# Patient Record
Sex: Male | Born: 1935
Health system: Southern US, Community
[De-identification: ages and names within clinical notes are randomized; demographics above are authoritative.]

## PROBLEM LIST (undated history)

## (undated) DIAGNOSIS — R55 Syncope and collapse: Secondary | ICD-10-CM

## (undated) DIAGNOSIS — C4491 Basal cell carcinoma of skin, unspecified: Secondary | ICD-10-CM

## (undated) DIAGNOSIS — I480 Paroxysmal atrial fibrillation: Secondary | ICD-10-CM

## (undated) DIAGNOSIS — I509 Heart failure, unspecified: Secondary | ICD-10-CM

## (undated) DIAGNOSIS — T4145XA Adverse effect of unspecified anesthetic, initial encounter: Secondary | ICD-10-CM

## (undated) DIAGNOSIS — I10 Essential (primary) hypertension: Secondary | ICD-10-CM

## (undated) DIAGNOSIS — N400 Enlarged prostate without lower urinary tract symptoms: Secondary | ICD-10-CM

## (undated) DIAGNOSIS — I2510 Atherosclerotic heart disease of native coronary artery without angina pectoris: Secondary | ICD-10-CM

## (undated) DIAGNOSIS — E785 Hyperlipidemia, unspecified: Secondary | ICD-10-CM

## (undated) DIAGNOSIS — I214 Non-ST elevation (NSTEMI) myocardial infarction: Secondary | ICD-10-CM

## (undated) DIAGNOSIS — K648 Other hemorrhoids: Secondary | ICD-10-CM

## (undated) DIAGNOSIS — I350 Nonrheumatic aortic (valve) stenosis: Secondary | ICD-10-CM

## (undated) DIAGNOSIS — K5732 Diverticulitis of large intestine without perforation or abscess without bleeding: Secondary | ICD-10-CM

## (undated) DIAGNOSIS — T8859XA Other complications of anesthesia, initial encounter: Secondary | ICD-10-CM

## (undated) HISTORY — DX: Diverticulitis of large intestine without perforation or abscess without bleeding: K57.32

## (undated) HISTORY — DX: Non-ST elevation (NSTEMI) myocardial infarction: I21.4

## (undated) HISTORY — DX: Essential (primary) hypertension: I10

## (undated) HISTORY — DX: Atherosclerotic heart disease of native coronary artery without angina pectoris: I25.10

## (undated) HISTORY — PX: SHOULDER SURGERY: SHX246

## (undated) HISTORY — DX: Benign prostatic hyperplasia without lower urinary tract symptoms: N40.0

## (undated) HISTORY — PX: CARDIAC CATHETERIZATION: SHX172

## (undated) HISTORY — PX: EYE SURGERY: SHX253

## (undated) HISTORY — DX: Other hemorrhoids: K64.8

## (undated) HISTORY — DX: Basal cell carcinoma of skin, unspecified: C44.91

## (undated) HISTORY — PX: COLONOSCOPY: SHX174

## (undated) HISTORY — PX: TRANSTHORACIC ECHOCARDIOGRAM: SHX275

## (undated) HISTORY — PX: TONSILLECTOMY: SUR1361

## (undated) HISTORY — DX: Hyperlipidemia, unspecified: E78.5

---

## 1989-06-28 DIAGNOSIS — Z951 Presence of aortocoronary bypass graft: Secondary | ICD-10-CM

## 1989-06-28 DIAGNOSIS — I2510 Atherosclerotic heart disease of native coronary artery without angina pectoris: Secondary | ICD-10-CM

## 1989-06-28 HISTORY — DX: Atherosclerotic heart disease of native coronary artery without angina pectoris: I25.10

## 1998-06-28 HISTORY — PX: CORONARY ARTERY BYPASS GRAFT: SHX141

## 1999-04-07 ENCOUNTER — Ambulatory Visit (HOSPITAL_COMMUNITY): Admission: RE | Admit: 1999-04-07 | Discharge: 1999-04-07 | Payer: Self-pay | Admitting: Cardiology

## 1999-04-13 ENCOUNTER — Inpatient Hospital Stay (HOSPITAL_COMMUNITY)
Admission: RE | Admit: 1999-04-13 | Discharge: 1999-04-18 | Payer: Self-pay | Admitting: Thoracic Surgery (Cardiothoracic Vascular Surgery)

## 1999-04-13 ENCOUNTER — Encounter: Payer: Self-pay | Admitting: Thoracic Surgery (Cardiothoracic Vascular Surgery)

## 1999-04-14 ENCOUNTER — Encounter: Payer: Self-pay | Admitting: Thoracic Surgery (Cardiothoracic Vascular Surgery)

## 1999-04-15 ENCOUNTER — Encounter: Payer: Self-pay | Admitting: Thoracic Surgery (Cardiothoracic Vascular Surgery)

## 1999-04-16 ENCOUNTER — Encounter: Payer: Self-pay | Admitting: Thoracic Surgery (Cardiothoracic Vascular Surgery)

## 1999-05-05 ENCOUNTER — Encounter (HOSPITAL_COMMUNITY): Admission: RE | Admit: 1999-05-05 | Discharge: 1999-08-03 | Payer: Self-pay | Admitting: Cardiology

## 2002-01-02 ENCOUNTER — Encounter: Payer: Self-pay | Admitting: Internal Medicine

## 2002-01-23 ENCOUNTER — Encounter: Payer: Self-pay | Admitting: Internal Medicine

## 2002-01-25 ENCOUNTER — Encounter: Payer: Self-pay | Admitting: Internal Medicine

## 2002-06-12 ENCOUNTER — Encounter: Payer: Self-pay | Admitting: Internal Medicine

## 2004-09-21 ENCOUNTER — Encounter: Admission: RE | Admit: 2004-09-21 | Discharge: 2004-09-21 | Payer: Self-pay | Admitting: Family Medicine

## 2004-09-21 ENCOUNTER — Encounter: Payer: Self-pay | Admitting: Internal Medicine

## 2005-01-05 ENCOUNTER — Encounter: Admission: RE | Admit: 2005-01-05 | Discharge: 2005-01-05 | Payer: Self-pay | Admitting: Orthopedic Surgery

## 2005-01-07 ENCOUNTER — Ambulatory Visit (HOSPITAL_BASED_OUTPATIENT_CLINIC_OR_DEPARTMENT_OTHER): Admission: RE | Admit: 2005-01-07 | Discharge: 2005-01-08 | Payer: Self-pay | Admitting: Orthopedic Surgery

## 2005-01-07 ENCOUNTER — Encounter: Payer: Self-pay | Admitting: Internal Medicine

## 2005-01-07 ENCOUNTER — Ambulatory Visit (HOSPITAL_COMMUNITY): Admission: RE | Admit: 2005-01-07 | Discharge: 2005-01-07 | Payer: Self-pay | Admitting: Orthopedic Surgery

## 2006-05-24 ENCOUNTER — Encounter: Payer: Self-pay | Admitting: Internal Medicine

## 2007-09-11 ENCOUNTER — Encounter: Payer: Self-pay | Admitting: Cardiology

## 2007-09-15 ENCOUNTER — Encounter: Payer: Self-pay | Admitting: Cardiology

## 2008-01-25 ENCOUNTER — Encounter: Payer: Self-pay | Admitting: Internal Medicine

## 2008-01-26 LAB — CONVERTED CEMR LAB
ALT: 17 units/L
AST: 25 units/L
Creatinine, Ser: 0.9 mg/dL
HDL: 73 mg/dL
Potassium: 4.9 meq/L
Total Bilirubin: 0.4 mg/dL
Triglycerides: 50 mg/dL

## 2008-03-11 ENCOUNTER — Ambulatory Visit: Payer: Self-pay | Admitting: Internal Medicine

## 2008-03-21 ENCOUNTER — Ambulatory Visit: Payer: Self-pay | Admitting: Internal Medicine

## 2008-03-25 ENCOUNTER — Encounter: Payer: Self-pay | Admitting: Cardiology

## 2008-07-04 ENCOUNTER — Ambulatory Visit: Payer: Self-pay | Admitting: Internal Medicine

## 2008-07-04 DIAGNOSIS — I10 Essential (primary) hypertension: Secondary | ICD-10-CM

## 2008-07-04 DIAGNOSIS — J310 Chronic rhinitis: Secondary | ICD-10-CM | POA: Insufficient documentation

## 2008-07-04 DIAGNOSIS — N400 Enlarged prostate without lower urinary tract symptoms: Secondary | ICD-10-CM

## 2008-07-04 DIAGNOSIS — E785 Hyperlipidemia, unspecified: Secondary | ICD-10-CM

## 2008-08-15 ENCOUNTER — Telehealth: Payer: Self-pay | Admitting: Internal Medicine

## 2008-09-10 ENCOUNTER — Encounter: Payer: Self-pay | Admitting: Cardiology

## 2009-02-06 ENCOUNTER — Ambulatory Visit: Payer: Self-pay | Admitting: Internal Medicine

## 2009-02-06 LAB — CONVERTED CEMR LAB
Albumin: 3.8 g/dL (ref 3.5–5.2)
Alkaline Phosphatase: 69 units/L (ref 39–117)
BUN: 17 mg/dL (ref 6–23)
CO2: 26 meq/L (ref 19–32)
Chloride: 99 meq/L (ref 96–112)
Creatinine, Ser: 1.01 mg/dL (ref 0.40–1.50)
Eosinophils Absolute: 0.5 10*3/uL (ref 0.0–0.7)
Eosinophils Relative: 6 % — ABNORMAL HIGH (ref 0–5)
HCT: 38.6 % — ABNORMAL LOW (ref 39.0–52.0)
HDL: 87 mg/dL (ref >39)
LDL Cholesterol: 70 mg/dL (ref 0–99)
Lymphs Abs: 1.8 10*3/uL (ref 0.7–4.0)
MCV: 84.3 fL (ref 78.0–100.0)
Monocytes Relative: 12 % (ref 3–12)
PSA: 10.24 ng/mL — ABNORMAL HIGH (ref 0.10–4.00)
RBC: 4.58 M/uL (ref 4.22–5.81)
Total Bilirubin: 0.6 mg/dL (ref 0.3–1.2)
VLDL: 12 mg/dL (ref 0–40)
WBC: 8.2 10*3/uL (ref 4.0–10.5)

## 2009-03-27 ENCOUNTER — Encounter: Payer: Self-pay | Admitting: Cardiology

## 2009-03-28 ENCOUNTER — Encounter: Payer: Self-pay | Admitting: Internal Medicine

## 2009-03-31 ENCOUNTER — Encounter: Payer: Self-pay | Admitting: Internal Medicine

## 2010-02-16 ENCOUNTER — Ambulatory Visit: Payer: Self-pay | Admitting: Internal Medicine

## 2010-02-17 ENCOUNTER — Telehealth: Payer: Self-pay | Admitting: Internal Medicine

## 2010-02-17 ENCOUNTER — Encounter: Payer: Self-pay | Admitting: Internal Medicine

## 2010-02-18 ENCOUNTER — Ambulatory Visit: Payer: Self-pay | Admitting: Internal Medicine

## 2010-02-23 ENCOUNTER — Ambulatory Visit: Payer: Self-pay | Admitting: Cardiology

## 2010-02-23 DIAGNOSIS — I2510 Atherosclerotic heart disease of native coronary artery without angina pectoris: Secondary | ICD-10-CM

## 2010-02-23 DIAGNOSIS — Z9861 Coronary angioplasty status: Secondary | ICD-10-CM

## 2010-03-04 ENCOUNTER — Encounter: Payer: Self-pay | Admitting: Internal Medicine

## 2010-03-10 ENCOUNTER — Ambulatory Visit (HOSPITAL_COMMUNITY): Admission: RE | Admit: 2010-03-10 | Discharge: 2010-03-10 | Payer: Self-pay | Admitting: Cardiology

## 2010-03-10 ENCOUNTER — Ambulatory Visit: Payer: Self-pay

## 2010-03-10 ENCOUNTER — Ambulatory Visit: Payer: Self-pay | Admitting: Cardiology

## 2010-03-10 ENCOUNTER — Encounter: Payer: Self-pay | Admitting: Cardiology

## 2010-07-26 LAB — CONVERTED CEMR LAB
BUN: 13 mg/dL (ref 6–23)
Bilirubin Urine: NEGATIVE
Bilirubin, Direct: 0.2 mg/dL (ref 0.0–0.3)
CO2: 28 meq/L (ref 19–32)
Chloride: 101 meq/L (ref 96–112)
Eosinophils Absolute: 0.2 10*3/uL (ref 0.0–0.7)
Glucose, Bld: 94 mg/dL (ref 70–99)
Indirect Bilirubin: 0.6 mg/dL (ref 0.0–0.9)
LDL Cholesterol: 84 mg/dL (ref 0–99)
Leukocytes, UA: NEGATIVE
Lymphocytes Relative: 22 % (ref 12–46)
Lymphs Abs: 1.6 10*3/uL (ref 0.7–4.0)
MCV: 88.3 fL (ref 78.0–100.0)
Monocytes Relative: 12 % (ref 3–12)
Neutro Abs: 4.6 10*3/uL (ref 1.7–7.7)
Neutrophils Relative %: 62 % (ref 43–77)
Potassium: 5.2 meq/L (ref 3.5–5.3)
Protein, ur: NEGATIVE mg/dL
RBC: 4.87 M/uL (ref 4.22–5.81)
Sodium: 137 meq/L (ref 135–145)
Specific Gravity, Urine: 1.015 (ref 1.005–1.030)
Total Bilirubin: 0.8 mg/dL (ref 0.3–1.2)
Total CHOL/HDL Ratio: 2.2
Urine Glucose: NEGATIVE mg/dL
Urobilinogen, UA: 0.2 (ref 0.0–1.0)
VLDL: 14 mg/dL (ref 0–40)
WBC: 7.4 10*3/uL (ref 4.0–10.5)

## 2010-07-28 NOTE — Assessment & Plan Note (Signed)
Summary: cpx/mhf   Vital Signs:  Patient profile:   77 year old male Height:      65 inches Weight:      152 pounds BMI:     25.39 O2 Sat:      98 % on Room air Temp:     98.1 degrees F oral Pulse rate:   58 / minute Pulse rhythm:   regular Resp:     16 per minute BP sitting:   110 / 70  (right arm) Cuff size:   regular  Vitals Entered By: Glendell Docker CMA (February 16, 2010 8:29 AM)  O2 Flow:  Room air  Primary Care Provider:  D. Thomos Lemons DO   History of Present Illness: 77 y/o white male for routine cpx  CAD - Dr. Riley Kill new cardiologist - 8/29 appt  elevated PSA - hasn't seen Dr. Vonita Moss  colonoscopy - 1.5 years ago  pt and wife are transitioning to assisted living facility he is independent  wife does not want to be in a difficult position in case something happens to pt  no hx of falls no memory problems   Preventive Screening-Counseling & Management  Alcohol-Tobacco     Alcohol drinks/day: 2     Smoking Status: 2quit  Caffeine-Diet-Exercise     Caffeine use/day: 2-3 cups coffee daily     Does Patient Exercise: yes     Times/week: 3  Allergies (verified): No Known Drug Allergies  Past History:  Past Medical History: CAD - 1990 angioplasty  Dr Aleen Campi S/P CABG x 4 Last stress test 1 yr ago - EF reported normal. Hypertension    Hyperlipidemia BPH - Dr. Vonita Moss Sigmoid diverticulosis and internal hemorrhoids - Dr. Leone Payor Basal cell carcinoma - left chest 05/2002  Social History: Former smoker - smoked for 15 yrs 1/2 ppd.  quit in 1973 Retired Licensed conveyancer business (wafer)  [employer] Alcohol use-yes (1-2 glasses of wine ) 2 children sons (52 -48) 5 grandchildren Regular exercise-yes 1 son lives in [place] area 1 son lives in [place] areaSmoking Status:  2quit Caffeine use/day:  2-3 cups coffee daily  Review of Systems       The patient complains of anorexia.  The patient denies weight loss, weight gain, chest pain, dyspnea on  exertion, melena, and hematochezia.    Physical Exam  General:  alert, well-developed, and well-nourished.   Head:  normocephalic and atraumatic.   Ears:  bil hearing aids Mouth:  good dentition and pharynx pink and moist.   Neck:  No deformities, masses, or tenderness noted. Lungs:  normal respiratory effort and normal breath sounds.   Heart:  normal rate and regular rhythm.  SEM 2/6 LSB Abdomen:  soft, non-tender, normal bowel sounds, no masses, no hepatomegaly, and no splenomegaly.   Extremities:  No lower extremity edema  Neurologic:  cranial nerves II-XII intact and gait normal.   Psych:  normally interactive, good eye contact, not anxious appearing, and not depressed appearing.     Impression & Recommendations:  Problem # 1:  PREVENTIVE HEALTH CARE (ICD-V70.0) Reviewed adult health maintenance protocols. Agree with decision for assisted living  Colonoscopy: Location:  [facility].   (03/21/2008) Td Booster: Historical (05/14/2009)   Flu Vax: given (03/28/2009)   Pneumovax: given (01/16/2008) Chol: 169 (02/06/2009)   HDL: 87 (02/06/2009)   LDL: 70 (02/06/2009)   TG: 59 (02/06/2009) TSH: 3.146 (02/06/2009)   PSA: 10.24 (02/06/2009)  Complete Medication List: 1)  Atenolol 25 Mg Tabs (Atenolol) .Marland KitchenMarland KitchenMarland Kitchen  Take 1 tablet by mouth once a day 2)  Lisinopril-hydrochlorothiazide 20-12.5 Mg Tabs (Lisinopril-hydrochlorothiazide) .... Take 1 tablet by mouth once a day 3)  Lovastatin 20 Mg Tabs (Lovastatin) .... Take 1 tablet by mouth once a day 4)  Finasteride 5 Mg Tabs (Finasteride) .... Take 1 tablet by mouth once a day 5)  Omega-3 Fish Oil 1200 Mg Caps (Omega-3 fatty acids) .... Take 1 tablet by mouth once a day 6)  Multivitamins Tabs (Multiple vitamin) .... Take 1 tablet by mouth once a day 7)  Tylenol Pm Extra Strength 500-25 Mg Tabs (Diphenhydramine-apap (sleep)) .... Take 1 tablet by mouth once a day as needed sleep  Other Orders: EKG w/ Interpretation (93000) T-Basic  Metabolic Panel [id number]) T-Hepatic Function [id number]) T-CBC w/Diff [id number]) T-Urinalysis (41324-40102) T-Lipid Profile (72536-64403) T-PSA (47425-95638) TB Skin Test (75643) Admin 1st Vaccine (32951)  Patient Instructions: 1)  Please schedule a follow-up appointment in 1 year. Prescriptions: FINASTERIDE 5 MG TABS (FINASTERIDE) Take 1 tablet by mouth once a day  #90 x 3   Entered and Authorized by:   D. Thomos Lemons DO   Signed by:   D. Thomos Lemons DO on 02/16/2010   Method used:   Electronically to        [facility]* (retail)             ,          Ph: [phone]       Fax: [phone]   RxID:   [id number] LOVASTATIN 20 MG TABS (LOVASTATIN) Take 1 tablet by mouth once a day  #90 x 3   Entered and Authorized by:   D. Thomos Lemons DO   Signed by:   D. Thomos Lemons DO on 02/16/2010   Method used:   Electronically to        [facility]* (retail)             ,          Ph: [phone]       Fax: [phone]   RxID:   [id number] LISINOPRIL-HYDROCHLOROTHIAZIDE 20-12.5 MG TABS (LISINOPRIL-HYDROCHLOROTHIAZIDE) Take 1 tablet by mouth once a day  #90 x 3   Entered and Authorized by:   D. Thomos Lemons DO   Signed by:   D. Thomos Lemons DO on 02/16/2010   Method used:   Electronically to        [facility]* (retail)             ,          Ph: [phone]       Fax: [phone]   RxID:   [id number] ATENOLOL 25 MG TABS (ATENOLOL) Take 1 tablet by mouth once a day  #90 x 3   Entered and Authorized by:   D. Thomos Lemons DO   Signed by:   D. Thomos Lemons DO on 02/16/2010   Method used:   Electronically to        [facility]* (retail)             ,          Ph: [phone]       Fax: [phone]   RxID:   [id number]   Current Allergies (reviewed today): No known allergies    Immunization History:  Tetanus/Td Immunization History:    Tetanus/Td:  historical (05/14/2009)  Zostavax History:    Zostavax # 1:  zostavax  (02/11/2004)  Immunizations Administered:  PPD Skin Test:  Vaccine Type: PPD    Site: left forearm    Mfr: Sanofi Pasteur    Dose: 0.1 ml    Route: ID    Given by: Glendell Docker CMA    Exp. Date: 04/30/2011    Lot #: [id number]

## 2010-07-28 NOTE — Progress Notes (Signed)
Summary: [facility] Office Visit Note   [facility] Office Visit Note   Imported By: Roderic Ovens 03/12/2010 15:14:38  _____________________________________________________________________  External Attachment:    Type:   Image     Comment:   External Document

## 2010-07-28 NOTE — Progress Notes (Signed)
Summary: [facility] Office Visit Note   [facility] Office Visit Note   Imported By: Roderic Ovens 03/12/2010 15:14:15  _____________________________________________________________________  External Attachment:    Type:   Image     Comment:   External Document

## 2010-07-28 NOTE — Letter (Signed)
Summary: [facility]   Imported By: Sherian Rein 03/14/2010 11:41:51  _____________________________________________________________________  External Attachment:    Type:   Image     Comment:   External Document

## 2010-07-28 NOTE — Assessment & Plan Note (Signed)
Summary: tb skin test read/dt  Nurse Visit   Primary Care Provider:  Dondra Spry DO  CC:  TB Skin Test Read.  History of Present Illness: The patient presented after 48 hours to check the injection site for positive or negative reaction.  Injection site examination:  No firm bump forms at the test site. Slightly reddish appearance  and diameter was smaller than 5mm.  Assement & Plan:  Negative Tb skin test. Patient was counseled to call if experience any irritation of site.  Glendell Docker CMA  February 18, 2010 8:50 AM    Allergies: No Known Drug Allergies

## 2010-07-28 NOTE — Assessment & Plan Note (Signed)
Summary: np6/dx: CAD   Visit Type:  Initial Consult Primary Provider:  Dondra Spry DO  CC:  CAD.  History of Present Illness: Patient has been followed by Dr. Aleen Campi since about 1991.  He had prior PCI and CABG then in 2000.  He is able to do everything he wants.  Patient walks three times per week.  Now seeing Dr. Artist Pais for primary care.  I care for his wife, so he wanted to transfer his care over to our office.  He remains active, does most of what he wants, denies any major issues at this time. Walks 30-40 minutes 3-4 times per week, does yard work, Catering manager.  No current complaints.   April 13, 1999  PROCEDURE:  On April 13, 1999.  DISCHARGE DIAGNOSES: 1. Coronary artery disease, status post coronary artery bypass grafting x 5. 2. Hyponatremia, resolved. 3. Elevated white blood cells, resolved. 4. Paroxysmal atrial fibrillation, asymptomatic, resolved on digoxin    protocol and Cardizem. 5. Benign prostatic hypertrophy. 6. Ejection fraction of 50%-55%. 7. History of premature ventricular contractions. 8. History of tobacco abuse.  PROCEDURE:  Status post coronary artery bypass grafting x 5, secondary to three-vessel disease with LIMA to the LAD, sequential SVG to the OM-I, and OM-III, and SVG to the diagonal-I, and SVG to the PDA by Dr. Viviann Spare C. Dorris Fetch on  April 13, 1999.  COMPLICATIONS:  Paroxysmal atrial fibrillation on April 16, 1999, resolving on April 17, 1999.  Review of records:  [facility] GXT 03/27/2009:  Neg GXT to HR 142.  occasional uinfocal PVCs and couplets, otherwise unremarkable.  Done by Coral Ceo, MD  Current Medications (verified): 1)  Atenolol 25 Mg Tabs (Atenolol) .... Take 1 Tablet By Mouth Once A Day 2)  Lisinopril-Hydrochlorothiazide 20-12.5 Mg Tabs (Lisinopril-Hydrochlorothiazide) .... Take 1 Tablet By Mouth Once A Day 3)  Lovastatin 20 Mg Tabs (Lovastatin) .... Take 1 Tablet By Mouth Once A Day 4)  Finasteride 5 Mg Tabs (Finasteride) ....  Take 1 Tablet By Mouth Once A Day 5)  Omega-3 Fish Oil 1200 Mg Caps (Omega-3 Fatty Acids) .... Take 1 Tablet By Mouth Once A Day 6)  Multivitamins  Tabs (Multiple Vitamin) .... Take 1 Tablet By Mouth Once A Day 7)  Tylenol Pm Extra Strength 500-25 Mg Tabs (Diphenhydramine-Apap (Sleep)) .... Take 1 Tablet By Mouth Once A Day As Needed Sleep  Allergies (verified): No Known Drug Allergies  Past History:  Past Medical History: Last updated: 02/19/2010 CAD - 1990 angioplasty  Dr Aleen Campi S/P CABG x 4 Last stress test 1 yr ago - EF reported normal. Hypertension   Hyperlipidemia BPH - Dr. Vonita Moss Sigmoid diverticulosis and internal hemorrhoids - Dr. Leone Payor Basal cell carcinoma - left chest 05/2002  Past Surgical History: Last updated: 02/19/2010 CABG x 4 2000 - Dr. Orson Aloe Colonoscopy    Shoulder surgery x 2 - Dr. Teressa Senter  Family History: Last updated: 02/19/2010 CAD - father Hyperlipidemia-father  alcoholism and tob abuse - age 24 Bipolar disorder-mother  Social History: Last updated: 02/19/2010 Former smoker - smoked for 15 yrs 1/2 ppd.  quit in 1973 Retired Licensed conveyancer business (wafer)  [employer] Alcohol use-yes (1-2 glasses of wine ) 2 children sons (52 -51) 5 grandchildren Regular exercise-yes  Risk Factors: Caffeine Use: 2 (07/04/2008) Exercise: yes (02/06/2009)  Review of Systems  The patient denies chest pain, syncope, dyspnea on exertion, peripheral edema, prolonged cough, muscle weakness, and difficulty walking.         Does complain of ED.  and urinary issues.    Vital Signs:  Patient profile:   77 year old male Height:      65 inches Weight:      155.25 pounds BMI:     25.93 Pulse rate:   60 / minute Pulse rhythm:   regular Resp:     18 per minute BP sitting:   100 / 68  (left arm) Cuff size:   large  Vitals Entered By: Vikki Ports (February 23, 2010 9:34 AM)  Physical Exam  General:  Well developed, well nourished, in no acute  distress. Head:  normocephalic and atraumatic Eyes:  PERRLA/EOM intact; conjunctiva and lids normal. Neck:  No JVD.  otherwise unremarkable status at present.  Lungs:  Clear bilaterally to auscultation and percussion. Heart:  PMI non displaced.  Normal S1 and S2.  No murmur or rub. Abdomen:  Bowel sounds positive; abdomen soft and non-tender without masses, organomegaly, or hernias noted. No hepatosplenomegaly. Extremities:  No clubbing or cyanosis. Neurologic:  Alert and oriented x 3.   EKG  Procedure date:  02/16/2010  Findings:      SB.  Otherwise normal.  Impression & Recommendations:  Problem # 1:  CAD, ARTERY BYPASS GRAFT (ICD-414.04) See report from above.  Doing well overall.  Regular exercise,  good medical care.  Now ten years out from CABG.  Suggest continue current habits.  Will get echo to assess LV function. His updated medication list for this problem includes:    Atenolol 25 Mg Tabs (Atenolol) .[name]... Take 1 tablet by mouth once a day    Lisinopril-hydrochlorothiazide 20-12.5 Mg Tabs (Lisinopril-hydrochlorothiazide) .[name]... Take 1 tablet by mouth once a day  Problem # 2:  HYPERLIPIDEMIA (ICD-272.4) under the current care of Dr. Artist Pais.  Statin options reveiwed.   His updated medication list for this problem includes:    Lovastatin 20 Mg Tabs (Lovastatin) .[name]... Take 1 tablet by mouth once a day  Problem # 3:  HYPERTENSION (ICD-401.9) controlled at present.   Reviewed data on atenolol. His updated medication list for this problem includes:    Atenolol 25 Mg Tabs (Atenolol) .[name]... Take 1 tablet by mouth once a day    Lisinopril-hydrochlorothiazide 20-12.5 Mg Tabs (Lisinopril-hydrochlorothiazide) .[name]... Take 1 tablet by mouth once a day  Orders: Echocardiogram (Echo)  Patient Instructions: 1)  Your physician wants you to follow-up in: 1 YEAR.  You will receive a reminder letter in the mail two months in advance. If you don't receive a letter, please call our office to  schedule the follow-up appointment. 2)  Your physician has requested that you have an echocardiogram.  Echocardiography is a painless test that uses sound waves to create images of your heart. It provides your doctor with information about the size and shape of your heart and how well your heart's chambers and valves are working.  This procedure takes approximately one hour. There are no restrictions for this procedure.

## 2010-07-28 NOTE — Progress Notes (Signed)
Summary: Lab Results  Phone Note Outgoing Call   Summary of Call: call pt - PSA elevated at 14.07.  copy was faxed to Dr. Enos Fling office.  please make sure pt has f/u appt with urologist Initial call taken by: D. Thomos Lemons DO,  February 17, 2010 12:33 PM  Follow-up for Phone Call        Left message on machine to return my call. Nicki Guadalajara Fergerson CMA Duncan Dull)  February 17, 2010 3:09 PM   Left message on machine to return my call.  Nicki Guadalajara Fergerson CMA Duncan Dull)  February 18, 2010 9:09 AM   Additional Follow-up for Phone Call Additional follow up Details #1::        patient presented to for ppd he has been advised per Dr Artist Pais instructions. He states that he will call today to obtain appointment with Dr Vonita Moss Additional Follow-up by: Glendell Docker CMA,  February 18, 2010 9:32 AM

## 2010-07-28 NOTE — Letter (Signed)
   [facility] [address] [place], [place]  [address]  [place] Phone: [phone]      February 17, 2010   [facility] [address] [place], [place] [address]  RE:  LAB RESULTS  Dear  Mr. TREESE,  The following is an interpretation of your most recent lab tests.  Please take note of any instructions provided or changes to medications that have resulted from your lab work.  PSA:  high - further testing needed PSA: 14.07  ELECTROLYTES:  Good - no changes needed  KIDNEY FUNCTION TESTS:  Good - no changes needed  LIVER FUNCTION TESTS:  Good - no changes needed  LIPID PANEL:  Good - no changes needed Triglyceride: 70   Cholesterol: 182   LDL: 84   HDL: 84   Chol/HDL%:  2.2 Ratio  THYROID STUDIES:  Thyroid studies normal TSH: 3.146     CBC:  Good - no changes needed  A copy of recent blood test was faxed to Dr. Vonita Moss re:  elevated PSA.       Sincerely Yours,    Dr. Thomos Lemons

## 2010-07-28 NOTE — Letter (Signed)
Summary: [facility] [place], Labs, Operative Report,Cath Report/  [facility] [place], Labs, Operative Report,Cath Report/Discharge Summary 1990 - 2008   Imported By: Roderic Ovens 03/12/2010 15:22:56  _____________________________________________________________________  External Attachment:    Type:   Image     Comment:   External Document

## 2010-07-28 NOTE — Miscellaneous (Signed)
Summary: Applicant Medical Certificate Form/[facility]/[place]   Imported By: Lanelle Bal 02/27/2010 08:58:48  _____________________________________________________________________  External Attachment:    Type:   Image     Comment:   External Document

## 2010-09-09 ENCOUNTER — Telehealth: Payer: Self-pay | Admitting: Family

## 2010-09-09 ENCOUNTER — Emergency Department (HOSPITAL_BASED_OUTPATIENT_CLINIC_OR_DEPARTMENT_OTHER)
Admission: EM | Admit: 2010-09-09 | Discharge: 2010-09-09 | Disposition: A | Discharge status: Home / Self Care | Payer: Medicare Other | Attending: Emergency Medicine | Admitting: Emergency Medicine

## 2010-09-09 DIAGNOSIS — I10 Essential (primary) hypertension: Secondary | ICD-10-CM | POA: Insufficient documentation

## 2010-09-09 DIAGNOSIS — I2510 Atherosclerotic heart disease of native coronary artery without angina pectoris: Secondary | ICD-10-CM | POA: Insufficient documentation

## 2010-09-09 DIAGNOSIS — IMO0002 Reserved for concepts with insufficient information to code with codable children: Secondary | ICD-10-CM | POA: Insufficient documentation

## 2010-09-09 DIAGNOSIS — Y92009 Unspecified place in unspecified non-institutional (private) residence as the place of occurrence of the external cause: Secondary | ICD-10-CM | POA: Insufficient documentation

## 2010-09-09 DIAGNOSIS — Z951 Presence of aortocoronary bypass graft: Secondary | ICD-10-CM | POA: Insufficient documentation

## 2010-09-09 DIAGNOSIS — Z79899 Other long term (current) drug therapy: Secondary | ICD-10-CM | POA: Insufficient documentation

## 2010-09-09 DIAGNOSIS — W07XXXA Fall from chair, initial encounter: Secondary | ICD-10-CM | POA: Insufficient documentation

## 2010-09-09 DIAGNOSIS — I252 Old myocardial infarction: Secondary | ICD-10-CM | POA: Insufficient documentation

## 2010-09-15 NOTE — Progress Notes (Addendum)
  Phone Note Other Incoming   Summary of Call: Pt presented to front desk following fall this afternoon in his office.  Noted to have swelling of his right forehead and associate abrasion.  Also reports + laceration to right cheek- which is obscured by cloth which has adhered to cheek.  Recommended to patient and wife that they proceed immediately to the ED downstairs.  They verbalized understanding. Initial call taken by: Lemont Fillers FNP,  September 09, 2010 4:37 PM

## 2010-10-28 ENCOUNTER — Inpatient Hospital Stay (HOSPITAL_COMMUNITY): Admit: 2010-10-28 | Payer: Self-pay | Admitting: Orthopedic Surgery

## 2010-11-13 NOTE — Discharge Summary (Signed)
[place]  Patient:    Justin [name]                        MRN: [id number] Adm. Date:  [id number] Disc. Date: 04/18/99 Attending:  Silvestre [name] Dictator:   Justin [name], P.A. CC:         Justin [name]. Justin [name], M.D.                           Discharge Summary  DATE OF BIRTH:  09/17/1928  PROCEDURE:  On April 13, 1999.  DISCHARGE DIAGNOSES: 1. Coronary artery disease, status post coronary artery bypass grafting x 5. 2. Hyponatremia, resolved. 3. Elevated white blood cells, resolved. 4. Paroxysmal atrial fibrillation, asymptomatic, resolved on digoxin    protocol and Cardizem. 5. Benign prostatic hypertrophy. 6. Ejection fraction of 50%-55%. 7. History of premature ventricular contractions. 8. History of tobacco abuse.  PROCEDURE:  Status post coronary artery bypass grafting x 5, secondary to three-vessel disease with LIMA to the LAD, sequential SVG to the OM-I, and OM-III, and SVG to the diagonal-I, and SVG to the PDA by Dr. Viviann Spare C. Dorris [name] on  April 13, 1999.  COMPLICATIONS:  Paroxysmal atrial fibrillation on April 16, 1999, resolving on April 17, 1999.  DISCHARGE MEDICATIONS: 1. Digoxin 0.125 mg one p.o. q.d. 2. K-Dur 20 mEq one p.o. q.d. 3. Cardizem CD 120 mg p.o. q.d. 4. Aspirin 325 mg p.o. q.d.  HISTORY OF PRESENT ILLNESS:  Justin [name] is a 77 year old white male, previously in good health, with a history of coronary artery disease dating back to an angioplasty 10 years ago.  The patient recently presented with recurrent exertional angina.  He had a positive stress test, and underwent a cardiac catheterization. The catheterization revealed severe three-vessel coronary artery disease.  The patient then was referred for a coronary artery bypass grafting.  HOSPITAL COURSE:  On April 13, 1999, the patient underwent a CABG, without complications by Dr. Dorris [name].  On April 14, 1999, postoperative  day number one, the patient was doing quite well.  Cardiovascularly he was stable; however, he developed some hematuria during the CABG.  On April 15, 1999, the hematuria resolved.  On April 16, 1999, the patient continued to ambulate.  His vital signs were stable.  He had no cardiac complaints; however, later that afternoon after  removing the pacing wires, he developed atrial fibrillation, which was asymptomatic.   Digitalis protocol was given, as well as Cardizem IV drip.  He converted to normal sinus rhythm later that evening, with a heart rate in the 70s. The Cardizem drip remained until weaned off.  On the other hand, his blood pressure was decreased to 96/62, and continued to be decreased on the morning of April 17, 1999, with a value of 95/55, with a pulse of 79.  His saturations were within normal limits at 93 on room air.  Therefore his atenolol was decreased to 12.5 instead of 25 q.d.  On April 18, 1999, his vital signs were stable.  He was afebrile.  He continued to be normal sinus rhythm, with no new episodes of arrhythmias.  His physical examination was unremarkable.  His incision was clean and dry.  He then was discharged in stable condition.  DISCHARGE INSTRUCTIONS:  Were provided and understood by the patient. DD:  06/24/99 TD:  06/24/99 Job: [id number]

## 2010-11-13 NOTE — Op Note (Signed)
Justin [name], Justin [name]                ACCOUNT NO.:  [id number]   MEDICAL RECORD NO.:  [id number]          PATIENT TYPE:  AMB   LOCATION:  DSC                          FACILITY:  [facility]   PHYSICIAN:  Katy Fitch. Sypher, M.D. 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   POSTOPERATIVE DIAGNOSIS:  Subacute-on-chronic retracted rotator cuff tear  involving supraspinatus, upper subscapularis, infraspinatus and a portion of  the teres minor with significant acromioclavicular degenerative arthritis  and rupture of long head of biceps.   OPERATION:  1.  Diagnostic arthroscopy of right glenohumeral joint followed by      arthroscopic debridement of biceps tendon stump, labral fragments and      synovitis with arthroscopic tenolysis of teres minor and infraspinatus      rotator cuff tendons  2.  Open reconstruction of right rotator cuff with transfer of superior 50%      of subscapularis tendon to infraspinatus for coverage of humeral head      followed by convergence of a retracted rotator cuff tear and advancement      laterally to a decorticated greater tuberosity.  3.  Resection of distal clavicle.   OPERATING SURGEON:  Katy Fitch. Sypher, M.D.   ASSISTANT:  Annye Rusk, PA-C.   ANESTHESIA:  general endotracheal supplemented by interscalene block.   SUPERVISING ANESTHESIOLOGIST:  Lenus Trauger. Gelene Mink, M.D.   INDICATIONS:  Justin [name] is a 77 year old man who was referred by Dr. Shela Commons.  D. Kindl for evaluation and management of painful right shoulder.   Justin [name] reported a history of developing shoulder pain in 2002.  He was  seen by Dr. Jearld Adjutant and was noted to have a single tendon rotator  cuff tear.  He was treated with  rehabilitation.   He subsequently did well until falling early in 2006.  Thereafter, he  developed obvious signs of a rupture of the long head of the biceps, marked  weakness of abduction and shoulder pain.  He was referred for an upper  extremity orthopedic consult.  Clinical examination revealed signs of a  massive rotator cuff tear.  Plain films demonstrated AC arthropathy and MRI  documented a chronic retracted tear of the supraspinatus and infraspinatus  with tendinopathy of the subscapularis and AC arthropathy.  The acromial  morphology was not particularly unfavorable.   Due to Justin [name]'s desire to play golf and remain active, we recommended that  we try to at least partially repair this subacute-on-chronic tear.   While it had been more than 3 months since his acute episode, he appeared to  have healthy-appearing subscapularis and teres minor tendons and a partially  healthy-appearing infraspinatus which should provide enough motor function  to cover his humeral head and stabilize the glenohumeral joint.   After informed consent, he is brought to the operating at this time.  During our informed consent prior to surgery, we mentioned that should this  be unsuccessful, he would be a candidate for a cuff tear arthropathy  hemiarthroplasty to relieve pain as a salvage strategy if our rotator cuff  reconstruction is unsuccessful in relieving his pain.   Preoperatively, questions were invited and answered.   PROCEDURE:  Justin [name] was brought to the operating room and placed in a  supine position upon the operating table.   Following an anesthesia consultation by Dr. Gelene Mink, an interscalene block  was placed without complication.   He subsequently transfer to the operating room and placed in supine position  upon the operating table.   Following the induction of general orotracheal anesthesia, he was carefully  positioned in the beach-chair position with the aid of a  torso and head  holder designed for shoulder arthroscopy.   The proximal right upper extremity and forequarter were prepped with  DuraPrep and draped with impervious arthroscopy drapes.   Examination of the shoulder under anesthesia revealed no capsular  contracture.   The scope was placed with blunt technique through a standard posterior  viewing portal.   The hyaline and articular cartilage surfaces of the glenoid and humeral head  were noted to be in good shape.  There was minimal chondromalacia.  The long  head of the biceps was ruptured with a small stump hanging within the joint.  There was abundant synovitis anteriorly, superiorly and posteriorly.  There  was significant degenerative labral fraying.   A 4.5-mm suction shaver was used to debride the stump of the biceps tendon  to perform labral debridement and to perform synovectomy.  A lateral portal  was created and the rotator cuff tear examined.   The teres minor was intact with some degeneration of its most anterior  fibers.  The infraspinatus and supraspinatus were retracted medially  approximately 3 cm.  A grabber was used through an anterior portal and a  suction shaver was used to tenolyse the infraspinatus and supraspinatus  tendons.   I could mobilize the infraspinatus to the greater tuberosity with a  combination of anterior and lateral traction.  The subscapularis appeared to  have a substantial tendon that could be used for transfer.  Therefore, I  elected to proceed with attempted open reconstruction.   The coracoacromial ligament was preserved.  There did not appear to be any  indications for significant acromioplasty.   The arthroscopic equipment was removed followed by a longitudinal incision  extending from the [facility] joint across the anterior acromion.   The anterior third of deltoid was minimally released over the anterolateral tip of the acromion followed by muscle-splitting to expose the greater   tuberosity.  The coracoacromial ligament was preserved; however, a  bursectomy was accomplished.   The distal 15 mm of clavicle were exposed with subperiosteal dissection with  a small osteotome, followed by use of an oscillating saw to remove the very  degenerative distal clavicle.  The deep surface of the acromion was palpated  and found to be smooth.  Anticipating a difficulty with rotator cuff  insufficiency, we preserved the coracoacromial ligament as a containment  structure.   The infraspinatus and supraspinous tendons were gathered with a grasping  suture of #2 FiberWire followed by synovectomy and further to the lysis.  The subscapularis was identified anteriorly and found to have a necrotic  superior 25% at its insertion; however, the inferior 75% of the tendinous  insertion revealed a very satisfactory musculotendinous unit.  The upper 50% of the lesser tuberosity was exposed by subperiosteal and  insertional release of the superior 50% of the subscapularis tendon followed  by splitting of the muscle fibers with tenotomy scissors.  This muscle was  mobilized superiorly and placed under traction with a #2 FiberWire stitch.   With tenolysis of this tendon as well as the supraspinatus, I was able to  converge the subscapularis to the supraspinatus and infraspinatus, covering  the humeral head.  A baseball stitch with 3 passes was used to converged to  the subscapularis and the infraspinatus and supraspinatus.  A total of 3  grasping #2 FiberWire sutures were then placed with through-bone McLaughlin  technique, securing the converged tendons as far laterally as possible.   We were able to reapproximate the infraspinatus to an anatomic position and  the supraspinatus and transferred to within 1 cm of an anatomic position.   Two Biocorkscrew anchors were used, one deep to the infraspinatus and one as  a __________ suture to reinforce the supraspinatus advancement.  The contour   of the lesser and greater tuberosities were lowered to a round profile with  a power bur prior to inset of the sutures and tendon.  This will provide a  smooth gliding surface for the area of uncover humeral head and should heal  with abundant scar response, helping anchor the advanced tendons.   The subacromial space was then carefully debrided of redundant bursa,  followed by repair of the dead space created by distal clavicle resection  with mattress suture of #2 FiberWire, followed by repair the anterior  deltoid to the corner of the acromion with #2 FiberWire wire mattress  sutures and repair the deltoid split with mattress suture of 0 Vicryl.   The skin was repaired with subdermal sutures of 2-0 Vicryl and intradermal 3-  0 Prolene segmental suture.   There were no apparent complications.  Mr. Jeffreys tolerated surgery and  anesthesia well.  There were no apparent complications.  PAS compression hose were used throughout the procedure as a deep vein  thrombosis prophylaxis.   We will mobilize Mr. Majano into a walking protocol for deep vein thrombosis  prevention as soon as he is conscious and oriented.       RVS/MEDQ  D:  01/07/2005  T:  01/07/2005  Job:  [id number]   cc:   Quita Skye. Artis Flock, M.D.  [address]  [place]  [place] [address]  Fax: [phone]

## 2011-02-19 ENCOUNTER — Encounter: Payer: Self-pay | Admitting: Cardiology

## 2011-03-10 ENCOUNTER — Encounter: Payer: Self-pay | Admitting: Cardiology

## 2011-03-10 ENCOUNTER — Ambulatory Visit (INDEPENDENT_AMBULATORY_CARE_PROVIDER_SITE_OTHER): Payer: Medicare Other | Admitting: Cardiology

## 2011-03-10 DIAGNOSIS — I350 Nonrheumatic aortic (valve) stenosis: Secondary | ICD-10-CM

## 2011-03-10 DIAGNOSIS — I10 Essential (primary) hypertension: Secondary | ICD-10-CM

## 2011-03-10 DIAGNOSIS — E785 Hyperlipidemia, unspecified: Secondary | ICD-10-CM

## 2011-03-10 DIAGNOSIS — I25810 Atherosclerosis of coronary artery bypass graft(s) without angina pectoris: Secondary | ICD-10-CM

## 2011-03-10 DIAGNOSIS — I2510 Atherosclerotic heart disease of native coronary artery without angina pectoris: Secondary | ICD-10-CM

## 2011-03-10 DIAGNOSIS — I359 Nonrheumatic aortic valve disorder, unspecified: Secondary | ICD-10-CM

## 2011-03-10 MED ORDER — ATENOLOL 25 MG PO TABS: 25.0000 mg | ORAL_TABLET | Freq: Every day | ORAL | Status: DC

## 2011-03-10 MED ORDER — LISINOPRIL-HYDROCHLOROTHIAZIDE 20-12.5 MG PO TABS: 1.0000 | ORAL_TABLET | Freq: Every day | ORAL | Status: DC

## 2011-03-10 MED ORDER — LOVASTATIN 20 MG PO TABS: 20.0000 mg | ORAL_TABLET | Freq: Every day | ORAL | Status: DC

## 2011-03-10 NOTE — Patient Instructions (Addendum)

## 2011-03-10 NOTE — Assessment & Plan Note (Signed)
Was not at target on last check.  He might benefit from another generic, such as pravastatin or simva.  Will ask him to get labs at Dr. Olegario Messier office, and then make an appointment for both BP and lipid follow up.

## 2011-03-10 NOTE — Progress Notes (Signed)
HPI:  He is doing well.  Denies any chest pain.  Works out regularly and also plays golf.  No major limitations.  Feels good.  Chart reviewed.    Current Outpatient Prescriptions  Medication Sig Dispense Refill  . atenolol (TENORMIN) 25 MG tablet Take 25 mg by mouth daily.        . finasteride (PROSCAR) 5 MG tablet Take 5 mg by mouth daily.        [name] lisinopril-hydrochlorothiazide (PRINZIDE,ZESTORETIC) 20-12.5 MG per tablet Take 1 tablet by mouth daily.        [name] lovastatin (MEVACOR) 20 MG tablet Take 20 mg by mouth at bedtime.        . Multiple Vitamin (MULTIVITAMIN) capsule Take 1 capsule by mouth daily.        . Multiple Vitamins-Minerals (ICAPS) CAPS Take by mouth daily.        . Omega-3 Fatty Acids (OMEGA-3 FISH OIL) 1200 MG CAPS Take 1 capsule by mouth daily.          No Known Allergies  Past Medical History  Diagnosis Date  . CAD (coronary artery disease)     s/p CABG  . HTN (hypertension)   . Hyperlipidemia   . BPH (benign prostatic hypertrophy)   . Sigmoid diverticulitis   . Internal hemorrhoids   . Basal cell carcinoma     Past Surgical History  Procedure Date  . Coronary artery bypass graft   . Colonoscopy   . Shoulder surgery     x2    Family History  Problem Relation Age of Onset  . Coronary artery disease Father   . Hyperlipidemia Father   . Bipolar disorder Mother   . Alcohol abuse      History   Social History  . Marital Status: Married    Spouse Name: N/A    Number of Children: 2  . Years of Education: N/A   Occupational History  . 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 Smoking status: Former Smoker -- 0.5 packs/day for 15 years    Quit date: 06/29/1971  . Smokeless tobacco: Never Used  . Alcohol Use: Yes     1-2 glasses of wine  . Drug Use: Not on file  . Sexually Active: Not on file   Other Topics Concern  . Not on file   Social History Narrative  . No narrative on file    ROS: Please see the HPI.   All other systems reviewed and negative.  PHYSICAL EXAM:  BP 156/84  Pulse 72  Resp 20  Ht 5\' 5"  (1.651 m)  Wt 152 lb (68.947 kg)  BMI 25.29 kg/m2  General: Well developed, well nourished, in no acute distress. Head:  Normocephalic and atraumatic. Neck: no JVD Lungs: Clear to auscultation and percussion. Heart: Normal S1 and S2.  No murmur, rubs or gallops.  Abdomen:  Normal bowel sounds; soft; non tender; no organomegaly Pulses: Pulses normal in all 4 extremities. Extremities: No clubbing or cyanosis. No edema. Neurologic: Alert and oriented x 3.  EKG:  NSR.  Left axis deviation.  Delay in R wave progression.   ASSESSMENT AND PLAN:

## 2011-03-10 NOTE — Assessment & Plan Note (Signed)
Not as well controlled.  Asked him to record, and take to Dr. Artist Pais.  Meds will need adjustment.

## 2011-03-10 NOTE — Assessment & Plan Note (Signed)
Perfectly stable without chest pain.  Now 12 years out from CABG.  Plays golf and works out regularly.

## 2011-03-10 NOTE — Assessment & Plan Note (Signed)
Not symptomatic, but has a murmur and moderate gradient one year ago.

## 2011-03-11 ENCOUNTER — Telehealth: Payer: Self-pay | Admitting: *Deleted

## 2011-03-11 ENCOUNTER — Telehealth: Payer: Self-pay | Admitting: Cardiology

## 2011-03-11 ENCOUNTER — Other Ambulatory Visit: Payer: Self-pay | Admitting: Cardiology

## 2011-03-11 DIAGNOSIS — I2510 Atherosclerotic heart disease of native coronary artery without angina pectoris: Secondary | ICD-10-CM

## 2011-03-11 DIAGNOSIS — E785 Hyperlipidemia, unspecified: Secondary | ICD-10-CM

## 2011-03-11 DIAGNOSIS — I10 Essential (primary) hypertension: Secondary | ICD-10-CM

## 2011-03-11 NOTE — Telephone Encounter (Signed)
Per Nicki Guadalajara, pt calling primary MD stating Dr. Riley Kill wanted patient to have lab work. need verification on what type of lab work patient suppose to have.

## 2011-03-11 NOTE — Telephone Encounter (Signed)
MD clinic visit on 03/10/11 is not noted that pt need labs . Left Nicki Guadalajara  A message at PCP office to call back.

## 2011-03-11 NOTE — Telephone Encounter (Signed)
Received call from pt stating he was told to contact Darlene re: lab order that Dr Riley Kill (cardiologist) would like him to have completed through his PMD's office. Pt states he can be contacted on his cell # [phone] for the next 15 minutes or around 5:15pm.

## 2011-03-11 NOTE — Telephone Encounter (Signed)
Please call Dr. Rosalyn Charters nurse to find out which labs he wants. If he is leaving it to PCP,  I suggest the following labs bmet - 401.9 CBC - use CAD code FLP, LFTs, TSH - 272.4

## 2011-03-11 NOTE — Telephone Encounter (Signed)
Contacted Dr Rosalyn Charters office and left message for his nurse to call me back with verification of tests needed.

## 2011-03-11 NOTE — Telephone Encounter (Signed)
Please sent to [facility] pharmacy.

## 2011-03-12 MED ORDER — ATENOLOL 25 MG PO TABS: 25.0000 mg | ORAL_TABLET | Freq: Every day | ORAL | Status: DC

## 2011-03-12 MED ORDER — LISINOPRIL-HYDROCHLOROTHIAZIDE 20-12.5 MG PO TABS: 1.0000 | ORAL_TABLET | Freq: Every day | ORAL | Status: DC

## 2011-03-12 MED ORDER — LOVASTATIN 20 MG PO TABS: 20.0000 mg | ORAL_TABLET | Freq: Every day | ORAL | Status: DC

## 2011-03-12 NOTE — Telephone Encounter (Signed)
Pt calling back re a lab order wants called to [facility] [phone], [place], pt requesting call when done [phone]

## 2011-03-12 NOTE — Telephone Encounter (Signed)
Spoke with nurse Nicki Guadalajara  in Dr. Olegario Messier office regarding patient calling  to get labs in their office. I let Nicki Guadalajara know that on Md's visit note on 03/10/11, under Hyperlipidemia DX. States " He may benefit from other Genetic such as Pravastatin or Simvastatin. Will ask pt to get labs at Dr. Olegario Messier office" I suggested to nurse  that MD may wants to check Lipid, LFT and BMET. Nicki Guadalajara states that is not clear what labs needed, so she will wait till Monday when MD is in the office, to see what labs  are needed.

## 2011-03-12 NOTE — Telephone Encounter (Signed)
Received call from Justin [name] stating that she will need to get clarification of lab orders from Dr Riley Kill on Monday as he is in the cath lab today. Advised pt of status.

## 2011-03-15 NOTE — Telephone Encounter (Signed)
Called pharmacy to send to his primary care -- not a cardiac med

## 2011-03-16 NOTE — Telephone Encounter (Signed)
I left a message for Justin [name] that Dr Riley Kill only made the recommendation that the pt follow-up with Dr Artist Pais for BP and cholesterol management.  Dr Riley Kill did not order any lab work from office visit.  Dr Riley Kill would like PCP to make determination about need for lab work and further follow-up.

## 2011-03-16 NOTE — Telephone Encounter (Signed)
Please have pt come in for labs listed below

## 2011-03-17 ENCOUNTER — Ambulatory Visit (HOSPITAL_COMMUNITY): Payer: Medicare Other | Attending: Cardiology | Admitting: Radiology

## 2011-03-17 DIAGNOSIS — E785 Hyperlipidemia, unspecified: Secondary | ICD-10-CM | POA: Insufficient documentation

## 2011-03-17 DIAGNOSIS — I080 Rheumatic disorders of both mitral and aortic valves: Secondary | ICD-10-CM | POA: Insufficient documentation

## 2011-03-17 DIAGNOSIS — I379 Nonrheumatic pulmonary valve disorder, unspecified: Secondary | ICD-10-CM | POA: Insufficient documentation

## 2011-03-17 DIAGNOSIS — I10 Essential (primary) hypertension: Secondary | ICD-10-CM | POA: Insufficient documentation

## 2011-03-17 DIAGNOSIS — I359 Nonrheumatic aortic valve disorder, unspecified: Secondary | ICD-10-CM

## 2011-03-17 DIAGNOSIS — I079 Rheumatic tricuspid valve disease, unspecified: Secondary | ICD-10-CM | POA: Insufficient documentation

## 2011-03-17 DIAGNOSIS — I350 Nonrheumatic aortic (valve) stenosis: Secondary | ICD-10-CM

## 2011-03-17 NOTE — Telephone Encounter (Signed)
Received message from Dr Rosalyn Charters nurse that he is leaving it up to PMD to determine labs that are needed. Notified pt. Lab orders entered and fowarded to the lab.

## 2011-03-19 LAB — CBC WITH DIFFERENTIAL/PLATELET
Basophils Relative: 1 % (ref 0–1)
Eosinophils Absolute: 0.1 10*3/uL (ref 0.0–0.7)
Hemoglobin: 12.9 g/dL — ABNORMAL LOW (ref 13.0–17.0)
MCH: 28.5 pg (ref 26.0–34.0)
MCHC: 33.6 g/dL (ref 30.0–36.0)
Monocytes Relative: 11 % (ref 3–12)
Neutrophils Relative %: 62 % (ref 43–77)
Platelets: 367 10*3/uL (ref 150–400)
RDW: 14.9 % (ref 11.5–15.5)

## 2011-03-19 LAB — BASIC METABOLIC PANEL
BUN: 15 mg/dL (ref 6–23)
Calcium: 9.3 mg/dL (ref 8.4–10.5)
Creat: 0.96 mg/dL (ref 0.50–1.35)

## 2011-03-20 LAB — LIPID PANEL
Cholesterol: 160 mg/dL (ref 0–200)
HDL: 77 mg/dL (ref >39)
Total CHOL/HDL Ratio: 2.1 Ratio
Triglycerides: 53 mg/dL (ref <150)
VLDL: 11 mg/dL (ref 0–40)

## 2011-03-20 LAB — HEPATIC FUNCTION PANEL
Bilirubin, Direct: 0.2 mg/dL (ref 0.0–0.3)
Total Bilirubin: 0.8 mg/dL (ref 0.3–1.2)

## 2011-03-20 LAB — TSH: TSH: 3.122 u[IU]/mL (ref 0.350–4.500)

## 2011-03-23 ENCOUNTER — Telehealth: Payer: Self-pay

## 2011-03-23 NOTE — Telephone Encounter (Signed)
Pt advised and verbalized understanding. Pt notes that he is leaving for a week long trip at 6am tomorrow morning. He says that he will do the BP monitoring and schedule a follow up appointment when he returns

## 2011-03-23 NOTE — Telephone Encounter (Signed)
Message copied by Beverely Low on Tue Mar 23, 2011  4:58 PM ------      Message from: Simeon Craft      Created: Mon Mar 22, 2011  9:21 AM       Call pt - Dr. Riley Kill recommends follow up visit with PCP re:  Hypertension and hyperlipidemia mgt.  I suggest pt check bp at home with automated (arm) BP cuff and make appt within one week.  Bring at least 7 readings to next follow up appt.  Pt should be seated with feet both on the ground.  No caffeine or exercise before bp measurement.  Pt should also be taken at least 2 hrs after he has taking his bp medication.

## 2011-05-07 ENCOUNTER — Telehealth: Payer: Self-pay | Admitting: Cardiology

## 2011-05-07 NOTE — Telephone Encounter (Signed)
Pt calling to discuss up to date data regarding pt BP readings for the past month or so. Please return pt call to discuss further.

## 2011-05-07 NOTE — Telephone Encounter (Signed)
I spoke with the pt and his average BP  over the past 32 days has been 156/104, pulse 59.  This is based on the pt's blood pressure 2 hours after taking Lisinopril.  The pt's SBP has ranged from 141 to 171 and DBP 86 to 117.  The pt's pulse has ranged from 50-68.  I reviewed Dr Rosalyn Charters September note and he wanted the pt to have his BP followed up with Dr Olegario Messier office.  I instructed the pt to contact his PCP for an appointment to discuss his BP.  The pt agreed with plan.

## 2011-05-13 ENCOUNTER — Telehealth: Payer: Self-pay | Admitting: Internal Medicine

## 2011-05-13 ENCOUNTER — Encounter: Payer: Self-pay | Admitting: Internal Medicine

## 2011-05-13 ENCOUNTER — Ambulatory Visit (INDEPENDENT_AMBULATORY_CARE_PROVIDER_SITE_OTHER): Payer: Medicare Other | Admitting: Internal Medicine

## 2011-05-13 DIAGNOSIS — E785 Hyperlipidemia, unspecified: Secondary | ICD-10-CM

## 2011-05-13 DIAGNOSIS — Z79899 Other long term (current) drug therapy: Secondary | ICD-10-CM

## 2011-05-13 DIAGNOSIS — I10 Essential (primary) hypertension: Secondary | ICD-10-CM

## 2011-05-13 NOTE — Patient Instructions (Signed)
Please schedule chem v58.69 prior to next visit

## 2011-05-13 NOTE — Progress Notes (Signed)
  Subjective:    Patient ID: Justin [name], male    DOB: October 22, 1928, 77 y.o.   MRN: [id number]  HPI Pt presents to clinic for evaluation of HTN. outpt bp log reviewed. sbp varies 130's 170's with dbp 80's 110's. asx without ha or dizziness. Has mild bradycardia asx as well maintained on atenolol. BP tends to be higher in the am. Tolerates prinzide without adverse effect. Reviewed sept labs with ldl 72 and nl lfts'. Maintained on statin without myalgias. No other complaints.  Past Medical History  Diagnosis Date  . CAD (coronary artery disease)     s/p CABG  . HTN (hypertension)   . Hyperlipidemia   . BPH (benign prostatic hypertrophy)   . Sigmoid diverticulitis   . Internal hemorrhoids   . Basal cell carcinoma    Past Surgical History  Procedure Date  . Coronary artery bypass graft   . Colonoscopy   . Shoulder surgery     x2    reports that he quit smoking about 39 years ago. He has never used smokeless tobacco. He reports that he drinks alcohol. His drug history not on file. family history includes Alcohol abuse in an unspecified family member; Bipolar disorder in his mother; Coronary artery disease in his father; and Hyperlipidemia in his father. No Known Allergies    Review of Systems see hpi     Objective:   Physical Exam  Physical Exam  Nursing note and vitals reviewed. Constitutional: Appears well-developed and well-nourished. No distress.  HENT:  Head: Normocephalic and atraumatic.  Right Ear: External ear normal.  Left Ear: External ear normal.  Eyes: Conjunctivae are normal. No scleral icterus.  Neck: Neck supple. Carotid bruit is not present.  Cardiovascular: Normal rate, regular rhythm and normal heart sounds.  Exam reveals no gallop and no friction rub.   No murmur heard. Pulmonary/Chest: Effort normal and breath sounds normal. No respiratory distress. He has no wheezes. no rales.  Lymphadenopathy:    He has no cervical adenopathy.  Neurological:Alert.    Skin: Skin is warm and dry. Not diaphoretic.  Psychiatric: Has a normal mood and affect.        Assessment & Plan:

## 2011-05-13 NOTE — Assessment & Plan Note (Signed)
Stable. ldl at goal. Continue current statin dosing.

## 2011-05-13 NOTE — Telephone Encounter (Signed)
Lab order entered for [place].  

## 2011-05-13 NOTE — Assessment & Plan Note (Signed)
suboptimal control. Asx. Increase prinzide 20/12.5 two a day. Monitor bp as outpt and call bp results to clinic. Bring bp machine to clinic next visit. Obtain chem7 prior to next appt

## 2011-05-26 ENCOUNTER — Other Ambulatory Visit: Payer: Self-pay | Admitting: *Deleted

## 2011-05-26 DIAGNOSIS — Z79899 Other long term (current) drug therapy: Secondary | ICD-10-CM

## 2011-05-26 LAB — BASIC METABOLIC PANEL
CO2: 26 mEq/L (ref 19–32)
Calcium: 9.3 mg/dL (ref 8.4–10.5)
Creat: 1.03 mg/dL (ref 0.50–1.35)

## 2011-06-08 ENCOUNTER — Encounter: Payer: Self-pay | Admitting: Internal Medicine

## 2011-06-08 ENCOUNTER — Ambulatory Visit (INDEPENDENT_AMBULATORY_CARE_PROVIDER_SITE_OTHER): Payer: Medicare Other | Admitting: Internal Medicine

## 2011-06-08 VITALS — BP 130/80 | HR 61 | Temp 97.8°F | Resp 16 | Wt 155.0 lb

## 2011-06-08 DIAGNOSIS — I10 Essential (primary) hypertension: Secondary | ICD-10-CM

## 2011-06-08 NOTE — Progress Notes (Signed)
  Subjective:    Patient ID: Justin [name], male    DOB: [birth year], 77 y.o.   MRN: [id number]  HPI Pt presents to clinic for follow up of HTN. S/p increase of prinzide last visit without side effect. Reviewed minimal hyponatremia taking diuretic. Home bp log shows consistently elevated bp often quite variable. bp checks at outside machine have been lower. Total time of visit approximately 20 minutes of which >50% spent in counseling.  Past Medical History  Diagnosis Date  . CAD (coronary artery disease)     s/p CABG  . HTN (hypertension)   . Hyperlipidemia   . BPH (benign prostatic hypertrophy)   . Sigmoid diverticulitis   . Internal hemorrhoids   . Basal cell carcinoma    Past Surgical History  Procedure Date  . Coronary artery bypass graft   . Colonoscopy   . Shoulder surgery     x2    reports that he quit smoking about 39 years ago. He has never used smokeless tobacco. He reports that he drinks alcohol. His drug history not on file. family history includes Alcohol abuse in an unspecified family member; Bipolar disorder in his mother; Coronary artery disease in his father; and Hyperlipidemia in his father. No Known Allergies   Review of Systems see hpi     Objective:   Physical Exam  Nursing note and vitals reviewed. Constitutional: He appears well-developed and well-nourished. No distress.  HENT:  Head: Normocephalic and atraumatic.  Neurological: He is alert.  Skin: He is not diaphoretic.  Psychiatric: He has a normal mood and affect.          Assessment & Plan:

## 2011-06-08 NOTE — Assessment & Plan Note (Signed)
Consider possible inaccuracy of home monitor. Continue current regimen. Obtain outside independent bp checks on another machine. Vary times of day of checks. Schedule routine followup

## 2011-06-09 ENCOUNTER — Ambulatory Visit: Payer: Medicare Other | Admitting: Internal Medicine

## 2011-08-09 ENCOUNTER — Telehealth: Payer: Self-pay | Admitting: Internal Medicine

## 2011-08-09 MED ORDER — LISINOPRIL-HYDROCHLOROTHIAZIDE 20-12.5 MG PO TABS: 2.0000 | ORAL_TABLET | Freq: Every day | ORAL | Status: DC

## 2011-08-09 NOTE — Telephone Encounter (Signed)
LISINOPRIL/HCTZ 20 -12.5 MG  dR hODGIN UPPED HIS DOSE TO 2 PILLS PER DAY.  IF THERE IS A 40/25 THAT WOULD BE FINE OR HE NEEDS A FILL OF THIS WITH DOUBLE THE AMOUNT TO [facility]

## 2011-08-09 NOTE — Telephone Encounter (Signed)
Rx refill sent to pharmacy. 

## 2011-08-16 ENCOUNTER — Telehealth: Payer: Self-pay | Admitting: Internal Medicine

## 2011-08-16 MED ORDER — LISINOPRIL-HYDROCHLOROTHIAZIDE 20-12.5 MG PO TABS: 2.0000 | ORAL_TABLET | Freq: Every day | ORAL | Status: DC

## 2011-08-16 NOTE — Telephone Encounter (Signed)
Rx refill sent to pharmacy. 

## 2011-08-16 NOTE — Telephone Encounter (Signed)
He needs to use [facility] their [phone] for his lisinopril 40-12-5.  He could not get them from [facility] as he was unaware that his ins had changed to [employer].  He is out of meds.  He would like to pick up a 30 day from [facility] on [address] and [address] and then have the 90 day sent to [facility].

## 2011-09-06 ENCOUNTER — Ambulatory Visit: Payer: Medicare Other | Admitting: Internal Medicine

## 2011-09-13 ENCOUNTER — Encounter: Payer: Self-pay | Admitting: Internal Medicine

## 2011-09-13 ENCOUNTER — Ambulatory Visit (INDEPENDENT_AMBULATORY_CARE_PROVIDER_SITE_OTHER): Payer: Medicare Other | Admitting: Internal Medicine

## 2011-09-13 VITALS — BP 124/80 | HR 55 | Temp 97.4°F | Resp 18 | Wt 154.0 lb

## 2011-09-13 DIAGNOSIS — R739 Hyperglycemia, unspecified: Secondary | ICD-10-CM | POA: Insufficient documentation

## 2011-09-13 DIAGNOSIS — R7309 Other abnormal glucose: Secondary | ICD-10-CM

## 2011-09-13 DIAGNOSIS — I10 Essential (primary) hypertension: Secondary | ICD-10-CM

## 2011-09-13 DIAGNOSIS — E785 Hyperlipidemia, unspecified: Secondary | ICD-10-CM

## 2011-09-13 NOTE — Assessment & Plan Note (Signed)
Mildly suboptimal but possibly related to caffeine ingestion. Stop caffeine and call bp log to clinic in two weeks. If remains suboptimal then titrate medication.

## 2011-09-13 NOTE — Progress Notes (Signed)
  Subjective:    Patient ID: Justin [name], male    DOB: 26-May-1929, 78 y.o.   MRN: [id number]  HPI Pt presents to clinic for followup of multiple medical problems. Home blood pressures have been mildly elevated without sx's with avg sbp 146 with HR's 50's. Believes there is association with caffeine. Compliant with medication without adverse effect. Reviewed slightly elevated glucose periodically. No active complaint.  Past Medical History  Diagnosis Date  . CAD (coronary artery disease)     s/p CABG  . HTN (hypertension)   . Hyperlipidemia   . BPH (benign prostatic hypertrophy)   . Sigmoid diverticulitis   . Internal hemorrhoids   . Basal cell carcinoma    Past Surgical History  Procedure Date  . Coronary artery bypass graft   . Colonoscopy   . Shoulder surgery     x2    reports that he quit smoking about 40 years ago. He has never used smokeless tobacco. He reports that he drinks alcohol. His drug history not on file. family history includes Alcohol abuse in an unspecified family member; Bipolar disorder in his mother; Coronary artery disease in his father; and Hyperlipidemia in his father. No Known Allergies    Review of Systems see hpi     Objective:   Physical Exam  Physical Exam  Nursing note and vitals reviewed. Constitutional: Appears well-developed and well-nourished. No distress.  HENT:  Head: Normocephalic and atraumatic.  Right Ear: External ear normal.  Left Ear: External ear normal.  Eyes: Conjunctivae are normal. No scleral icterus.  Neck: Neck supple. Carotid bruit is not present.  Cardiovascular: Normal rate, regular rhythm and normal heart sounds.  Exam reveals no gallop and no friction rub.   No murmur heard. Pulmonary/Chest: Effort normal and breath sounds normal. No respiratory distress. He has no wheezes. no rales.  Lymphadenopathy:    He has no cervical adenopathy.  Neurological:Alert.  Skin: Skin is warm and dry. Not diaphoretic.    Psychiatric: Has a normal mood and affect.        Assessment & Plan:

## 2011-09-13 NOTE — Assessment & Plan Note (Signed)
Obtain lipid/lft. 

## 2011-09-13 NOTE — Assessment & Plan Note (Signed)
Obtain a1c.  

## 2011-09-14 LAB — HEMOGLOBIN A1C
Hgb A1c MFr Bld: 6.1 % — ABNORMAL HIGH (ref <5.7)
Mean Plasma Glucose: 128 mg/dL — ABNORMAL HIGH (ref <117)

## 2011-09-14 LAB — LIPID PANEL
Cholesterol: 194 mg/dL (ref 0–200)
LDL Cholesterol: 102 mg/dL — ABNORMAL HIGH (ref 0–99)
Total CHOL/HDL Ratio: 2.5 Ratio
Triglycerides: 74 mg/dL (ref <150)
VLDL: 15 mg/dL (ref 0–40)

## 2011-09-14 LAB — HEPATIC FUNCTION PANEL
ALT: 18 U/L (ref 0–53)
Albumin: 4.2 g/dL (ref 3.5–5.2)
Total Protein: 7.1 g/dL (ref 6.0–8.3)

## 2011-12-06 ENCOUNTER — Ambulatory Visit (INDEPENDENT_AMBULATORY_CARE_PROVIDER_SITE_OTHER): Payer: Medicare Other | Admitting: Internal Medicine

## 2011-12-06 ENCOUNTER — Encounter: Payer: Self-pay | Admitting: Internal Medicine

## 2011-12-06 ENCOUNTER — Telehealth: Payer: Self-pay | Admitting: Internal Medicine

## 2011-12-06 VITALS — BP 100/70 | HR 60 | Temp 97.8°F | Resp 16 | Ht 65.0 in | Wt 152.0 lb

## 2011-12-06 DIAGNOSIS — I10 Essential (primary) hypertension: Secondary | ICD-10-CM

## 2011-12-06 DIAGNOSIS — R739 Hyperglycemia, unspecified: Secondary | ICD-10-CM

## 2011-12-06 DIAGNOSIS — R7309 Other abnormal glucose: Secondary | ICD-10-CM

## 2011-12-06 DIAGNOSIS — E785 Hyperlipidemia, unspecified: Secondary | ICD-10-CM

## 2011-12-06 DIAGNOSIS — Z79899 Other long term (current) drug therapy: Secondary | ICD-10-CM

## 2011-12-06 LAB — BASIC METABOLIC PANEL
CO2: 27 mEq/L (ref 19–32)
Calcium: 9.3 mg/dL (ref 8.4–10.5)
Creat: 1.07 mg/dL (ref 0.50–1.35)
Sodium: 131 mEq/L — ABNORMAL LOW (ref 135–145)

## 2011-12-06 MED ORDER — LISINOPRIL-HYDROCHLOROTHIAZIDE 20-12.5 MG PO TABS: 2.0000 | ORAL_TABLET | Freq: Every day | ORAL | Status: DC

## 2011-12-06 NOTE — Patient Instructions (Signed)
Please schedule fasting labs prior to next visit Lipid/lft-272.4 

## 2011-12-06 NOTE — Progress Notes (Signed)
  Subjective:    Patient ID: Justin [name], male    DOB: [birth year], 78 y.o.   MRN: [id number]  HPI Pt presents to clinic for followup of multiple medical problems. Home bp's nl. Tolerating medication without adverse effect. Reviewed past mild hyperglycemia without h/o dm. Seeing urology for bph  Past Medical History  Diagnosis Date  . CAD (coronary artery disease)     s/p CABG  . HTN (hypertension)   . Hyperlipidemia   . BPH (benign prostatic hypertrophy)   . Sigmoid diverticulitis   . Internal hemorrhoids   . Basal cell carcinoma    Past Surgical History  Procedure Date  . Coronary artery bypass graft   . Colonoscopy   . Shoulder surgery     x2    reports that he quit smoking about 40 years ago. He has never used smokeless tobacco. He reports that he drinks alcohol. His drug history not on file. family history includes Alcohol abuse in an unspecified family member; Bipolar disorder in his mother; Coronary artery disease in his father; and Hyperlipidemia in his father. No Known Allergies    Review of Systems see hpi     Objective:   Physical Exam  Physical Exam  Nursing note and vitals reviewed. Constitutional: Appears well-developed and well-nourished. No distress.  HENT:  Head: Normocephalic and atraumatic.  Right Ear: External ear normal.  Left Ear: External ear normal.  Eyes: Conjunctivae are normal. No scleral icterus.  Neck: Neck supple. Carotid bruit is not present.  Cardiovascular: Normal rate, regular rhythm and normal heart sounds.  Exam reveals no gallop and no friction rub.   3/6 sm Pulmonary/Chest: Effort normal and breath sounds normal. No respiratory distress. He has no wheezes. no rales.  Lymphadenopathy:    He has no cervical adenopathy.  Neurological:Alert.  Skin: Skin is warm and dry. Not diaphoretic.  Psychiatric: Has a normal mood and affect.        Assessment & Plan:

## 2011-12-06 NOTE — Telephone Encounter (Signed)
Lab order entered for October 2013. 

## 2011-12-07 LAB — HEMOGLOBIN A1C
Hgb A1c MFr Bld: 5.9 % — ABNORMAL HIGH (ref <5.7)
Mean Plasma Glucose: 123 mg/dL — ABNORMAL HIGH (ref <117)

## 2011-12-12 NOTE — Assessment & Plan Note (Signed)
Normotensive and stable. Continue current regimen. Monitor bp as outpt and followup in clinic as scheduled.  

## 2011-12-12 NOTE — Assessment & Plan Note (Signed)
Obtain chem7, a1c 

## 2011-12-14 ENCOUNTER — Other Ambulatory Visit: Payer: Self-pay | Admitting: *Deleted

## 2011-12-14 MED ORDER — LISINOPRIL 20 MG PO TABS: 20.0000 mg | ORAL_TABLET | Freq: Every day | ORAL | Status: DC

## 2011-12-14 NOTE — Telephone Encounter (Signed)
Patient returned phone call regarding lab results from 12/06/2011. He was informed per Dr Emilee Hero instructions. He has requested the new Rx for Lisinopril to [facility].  Rx sent per patient request.

## 2012-02-16 ENCOUNTER — Other Ambulatory Visit: Payer: Self-pay | Admitting: *Deleted

## 2012-02-16 MED ORDER — LOVASTATIN 20 MG PO TABS: 20.0000 mg | ORAL_TABLET | Freq: Every day | ORAL | Status: DC

## 2012-02-18 ENCOUNTER — Telehealth: Payer: Self-pay | Admitting: Internal Medicine

## 2012-02-18 NOTE — Telephone Encounter (Signed)
Patient has scheduled himself for hearing test @   [facility], needs a referral order,so medicare will pay. Fax  [phone]

## 2012-02-20 ENCOUNTER — Other Ambulatory Visit: Payer: Self-pay | Admitting: Internal Medicine

## 2012-02-20 DIAGNOSIS — H9190 Unspecified hearing loss, unspecified ear: Secondary | ICD-10-CM

## 2012-02-25 ENCOUNTER — Other Ambulatory Visit: Payer: Self-pay | Admitting: *Deleted

## 2012-02-25 MED ORDER — ATENOLOL 25 MG PO TABS: 25.0000 mg | ORAL_TABLET | Freq: Every day | ORAL | Status: DC

## 2012-03-15 ENCOUNTER — Telehealth: Payer: Self-pay | Admitting: *Deleted

## 2012-03-15 DIAGNOSIS — E785 Hyperlipidemia, unspecified: Secondary | ICD-10-CM

## 2012-03-15 NOTE — Telephone Encounter (Signed)
Received message from pt wanting to have labs drawn 03/24/12. Left detailed message on home # that he can return on 03/24/12 for labs. Lab order has been given to the lab.

## 2012-03-24 NOTE — Addendum Note (Signed)
Addended by: Regis Bill on: 03/24/2012 03:17 PM   Modules accepted: Orders

## 2012-03-24 NOTE — Telephone Encounter (Signed)
Lab orders released/SLS 

## 2012-03-25 LAB — LIPID PANEL
HDL: 74 mg/dL (ref >39)
LDL Cholesterol: 71 mg/dL (ref 0–99)
Triglycerides: 44 mg/dL (ref <150)
VLDL: 9 mg/dL (ref 0–40)

## 2012-03-25 LAB — HEPATIC FUNCTION PANEL
Albumin: 4 g/dL (ref 3.5–5.2)
Indirect Bilirubin: 0.4 mg/dL (ref 0.0–0.9)
Total Protein: 6.6 g/dL (ref 6.0–8.3)

## 2012-04-03 ENCOUNTER — Ambulatory Visit (INDEPENDENT_AMBULATORY_CARE_PROVIDER_SITE_OTHER): Payer: Medicare Other | Admitting: Internal Medicine

## 2012-04-03 ENCOUNTER — Encounter: Payer: Self-pay | Admitting: Internal Medicine

## 2012-04-03 VITALS — BP 112/78 | HR 58 | Temp 98.2°F | Wt 149.0 lb

## 2012-04-03 DIAGNOSIS — Z23 Encounter for immunization: Secondary | ICD-10-CM

## 2012-04-03 DIAGNOSIS — Z79899 Other long term (current) drug therapy: Secondary | ICD-10-CM

## 2012-04-03 DIAGNOSIS — L989 Disorder of the skin and subcutaneous tissue, unspecified: Secondary | ICD-10-CM

## 2012-04-03 DIAGNOSIS — E785 Hyperlipidemia, unspecified: Secondary | ICD-10-CM

## 2012-04-03 DIAGNOSIS — R42 Dizziness and giddiness: Secondary | ICD-10-CM | POA: Insufficient documentation

## 2012-04-03 DIAGNOSIS — I10 Essential (primary) hypertension: Secondary | ICD-10-CM

## 2012-04-03 NOTE — Progress Notes (Signed)
  Subjective:    Patient ID: Justin [name], male    DOB: Mar 18, 1929, 78 y.o.   MRN: [id number]  HPI Pt presents to clinic for followup of multiple medical problems. Notes intermittent dizziness without vertigo presyncope or syncope. Occurs when climbing ladder occurs sometimes with possible head turning. Blood pressure reviewed as normotensive. Reviewed LDL now at goal improved. Has nonhealing skin lesion on the top of his head for approximately 6 months. Has applied Neosporin without improvement. Sees dermatology periodically but not recently.  Past Medical History  Diagnosis Date  . CAD (coronary artery disease)     s/p CABG  . HTN (hypertension)   . Hyperlipidemia   . BPH (benign prostatic hypertrophy)   . Sigmoid diverticulitis   . Internal hemorrhoids   . Basal cell carcinoma    Past Surgical History  Procedure Date  . Coronary artery bypass graft   . Colonoscopy   . Shoulder surgery     x2    reports that he quit smoking about 40 years ago. He has never used smokeless tobacco. He reports that he drinks alcohol. His drug history not on file. family history includes Alcohol abuse in an unspecified family member; Bipolar disorder in his mother; Coronary artery disease in his father; and Hyperlipidemia in his father. No Known Allergies    Review of Systems see hpi     Objective:   Physical Exam  Physical Exam  Nursing note and vitals reviewed. Constitutional: Appears well-developed and well-nourished. No distress.  HENT: Dizziness not reproduced with head turning Head: Normocephalic and atraumatic.  Right Ear: External ear normal.  Left Ear: External ear normal.  Eyes: Conjunctivae are normal. No scleral icterus.  Neck: Neck supple. Carotid bruit is not present.  Cardiovascular: Normal rate, regular rhythm and normal heart sounds.  Exam reveals no gallop and no friction rub.   No murmur heard. Pulmonary/Chest: Effort normal and breath sounds normal. No respiratory  distress. He has no wheezes. no rales.  Lymphadenopathy:    He has no cervical adenopathy.  Neurological:Alert.  Skin: Skin is warm and dry. Not diaphoretic.  2 cm slightly raised skin lesion top of head. + Slight scabbing.  Psychiatric: Has a normal mood and affect.        Assessment & Plan:

## 2012-04-03 NOTE — Assessment & Plan Note (Signed)
Normotensive and stable. Continue current regimen. Monitor bp as outpt and followup in clinic as scheduled.  

## 2012-04-03 NOTE — Patient Instructions (Signed)
Please schedule fasting labs prior to next visit Cbc-401.9, chem7-v58.69 and lipid/lft-272.4 

## 2012-04-03 NOTE — Assessment & Plan Note (Signed)
Nonhealing located on scalp. Recommend dermatology evaluation. Patient has existing dermatologist and states he will call.

## 2012-04-03 NOTE — Assessment & Plan Note (Signed)
History and exam not conclusive for vestibular etiology. Discussed referral to dizziness clinic and currently defers. Will call if he reconsiders

## 2012-04-03 NOTE — Assessment & Plan Note (Addendum)
Improved. Now at goal. Continue statin at current dose.

## 2012-05-15 ENCOUNTER — Other Ambulatory Visit: Payer: Self-pay | Admitting: *Deleted

## 2012-05-15 ENCOUNTER — Telehealth: Payer: Self-pay | Admitting: Internal Medicine

## 2012-05-15 MED ORDER — LOVASTATIN 20 MG PO TABS: 20.0000 mg | ORAL_TABLET | Freq: Every day | ORAL | Status: DC

## 2012-05-15 MED ORDER — LISINOPRIL 20 MG PO TABS: 20.0000 mg | ORAL_TABLET | Freq: Every day | ORAL | Status: DC

## 2012-05-15 NOTE — Telephone Encounter (Signed)
Rx to pharmacy/SLS 

## 2012-05-15 NOTE — Telephone Encounter (Signed)
Refill- lisinopril 20mg  tab. Take one by mouth daily. Qty 90 days supply

## 2012-06-14 ENCOUNTER — Ambulatory Visit (INDEPENDENT_AMBULATORY_CARE_PROVIDER_SITE_OTHER): Payer: Medicare Other | Admitting: Cardiology

## 2012-06-14 ENCOUNTER — Encounter: Payer: Self-pay | Admitting: Cardiology

## 2012-06-14 VITALS — BP 142/60 | HR 58 | Ht 65.0 in | Wt 153.4 lb

## 2012-06-14 DIAGNOSIS — I2510 Atherosclerotic heart disease of native coronary artery without angina pectoris: Secondary | ICD-10-CM

## 2012-06-14 DIAGNOSIS — E785 Hyperlipidemia, unspecified: Secondary | ICD-10-CM

## 2012-06-14 DIAGNOSIS — I359 Nonrheumatic aortic valve disorder, unspecified: Secondary | ICD-10-CM

## 2012-06-14 DIAGNOSIS — I25810 Atherosclerosis of coronary artery bypass graft(s) without angina pectoris: Secondary | ICD-10-CM

## 2012-06-14 DIAGNOSIS — I350 Nonrheumatic aortic (valve) stenosis: Secondary | ICD-10-CM

## 2012-06-14 NOTE — Patient Instructions (Addendum)
Your physician wants you to follow-up in:  1 year with Dr. Marca Ancona. You will receive a reminder letter in the mail two months in advance. If you don't receive a letter, please call our office to schedule the follow-up appointment.  Your physician recommends that you continue on your current medications as directed. Please refer to the Current Medication list given to you today.

## 2012-06-24 NOTE — Progress Notes (Signed)
   HPI:  The patient returns in a follow up visit.  He remains active, and continues to get along well.  He is able to do most things without difficulty.    Current Outpatient Prescriptions  Medication Sig Dispense Refill  . atenolol (TENORMIN) 25 MG tablet Take 1 tablet (25 mg total) by mouth daily.  90 tablet  3  . finasteride (PROSCAR) 5 MG tablet Take 5 mg by mouth daily.        [name] lisinopril (PRINIVIL,ZESTRIL) 20 MG tablet Take 1 tablet (20 mg total) by mouth daily.  90 tablet  1  . lovastatin (MEVACOR) 20 MG tablet Take 1 tablet (20 mg total) by mouth at bedtime.  90 tablet  0  . MELATONIN PO Take by mouth at bedtime as needed.      . Multiple Vitamin (MULTIVITAMIN) capsule Take 1 capsule by mouth daily.        . Multiple Vitamins-Minerals (ICAPS) CAPS Take by mouth daily.        . Omega-3 Fatty Acids (OMEGA-3 FISH OIL) 1200 MG CAPS Take 1 capsule by mouth daily.          No Known Allergies  Past Medical History  Diagnosis Date  . CAD (coronary artery disease)     s/p CABG  . HTN (hypertension)   . Hyperlipidemia   . BPH (benign prostatic hypertrophy)   . Sigmoid diverticulitis   . Internal hemorrhoids   . Basal cell carcinoma     Past Surgical History  Procedure Date  . Coronary artery bypass graft   . Colonoscopy   . Shoulder surgery     x2    Family History  Problem Relation Age of Onset  . Coronary artery disease Father   . Hyperlipidemia Father   . Bipolar disorder Mother   . Alcohol abuse      History   Social History  . Marital Status: Married    Spouse Name: N/A    Number of Children: 2  . Years of Education: N/A   Occupational History  . 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 Smoking status: Former Smoker -- 0.5 packs/day for 15 years    Quit date: 06/29/1971  . Smokeless tobacco: Never Used  . Alcohol Use: Yes     Comment: 1-2 glasses of wine  . Drug Use: Not on file  . Sexually Active: Not on file    Other Topics Concern  . Not on file   Social History Narrative  . No narrative on file    ROS: Please see the HPI.  All other systems reviewed and negative.  PHYSICAL EXAM:  BP 142/60  Pulse 58  Ht 5\' 5"  (1.651 m)  Wt 153 lb 6.4 oz (69.582 kg)  BMI 25.53 kg/m2  SpO2 98%  General: Well developed, well nourished, in no acute distress. Head:  Normocephalic and atraumatic. Neck: no JVD Lungs: Clear to auscultation and percussion. Heart: Normal S1 and S2.  2/6 mid peaking SEM.  Preserved S2.  Pulses: Pulses normal in all 4 extremities. Extremities: No clubbing or cyanosis. No edema. Neurologic: Alert and oriented x 3.  EKG:  Sinus bradycardia.  Left axis deviation. No acute changes.    ASSESSMENT AND PLAN:

## 2012-06-27 ENCOUNTER — Other Ambulatory Visit: Payer: Self-pay

## 2012-06-27 DIAGNOSIS — I359 Nonrheumatic aortic valve disorder, unspecified: Secondary | ICD-10-CM

## 2012-06-27 NOTE — Assessment & Plan Note (Signed)
The patient continues to do well now 13 years post CABG.  He is active, without significant limitation.  Prior grafts times five .  Continue to monitor with annual follow up in cardiology.

## 2012-06-27 NOTE — Assessment & Plan Note (Signed)
He had moderate AS.  He will need a repeat echo some time just after the first of the year.

## 2012-06-27 NOTE — Assessment & Plan Note (Signed)
Near target on low dose lovastatin.  Continue regimen.

## 2012-08-21 ENCOUNTER — Other Ambulatory Visit: Payer: Self-pay | Admitting: *Deleted

## 2012-08-21 MED ORDER — LOVASTATIN 20 MG PO TABS: 20.0000 mg | ORAL_TABLET | Freq: Every day | ORAL | Status: DC

## 2012-08-30 ENCOUNTER — Ambulatory Visit (HOSPITAL_COMMUNITY): Payer: Medicare Other | Attending: Cardiology | Admitting: Radiology

## 2012-08-30 DIAGNOSIS — I359 Nonrheumatic aortic valve disorder, unspecified: Secondary | ICD-10-CM | POA: Insufficient documentation

## 2012-08-30 NOTE — Progress Notes (Signed)
Echocardiogram performed.  

## 2012-09-07 ENCOUNTER — Encounter: Payer: Self-pay | Admitting: Cardiology

## 2012-09-07 NOTE — Telephone Encounter (Signed)
This encounter was created in error - please disregard.

## 2012-09-07 NOTE — Telephone Encounter (Signed)
Pt rtn call to linda

## 2012-09-22 NOTE — Addendum Note (Signed)
Addended by: Regis Bill on: 09/22/2012 09:05 AM   Modules accepted: Orders

## 2012-09-22 NOTE — Addendum Note (Signed)
Addended by: Regis Bill on: 09/22/2012 09:06 AM   Modules accepted: Orders

## 2012-09-23 LAB — LIPID PANEL
Cholesterol: 160 mg/dL (ref 0–200)
LDL Cholesterol: 82 mg/dL (ref 0–99)
Total CHOL/HDL Ratio: 2.4 Ratio
VLDL: 10 mg/dL (ref 0–40)

## 2012-09-23 LAB — BASIC METABOLIC PANEL
BUN: 16 mg/dL (ref 6–23)
CO2: 26 mEq/L (ref 19–32)
Chloride: 104 mEq/L (ref 96–112)
Glucose, Bld: 99 mg/dL (ref 70–99)
Potassium: 5.2 mEq/L (ref 3.5–5.3)
Sodium: 139 mEq/L (ref 135–145)

## 2012-09-23 LAB — CBC WITH DIFFERENTIAL/PLATELET
Basophils Relative: 1 % (ref 0–1)
HCT: 40.9 % (ref 39.0–52.0)
Hemoglobin: 13.4 g/dL (ref 13.0–17.0)
Lymphs Abs: 1.4 10*3/uL (ref 0.7–4.0)
MCHC: 32.8 g/dL (ref 30.0–36.0)
Monocytes Absolute: 0.7 10*3/uL (ref 0.1–1.0)
Monocytes Relative: 11 % (ref 3–12)
Neutro Abs: 3.8 10*3/uL (ref 1.7–7.7)
Neutrophils Relative %: 62 % (ref 43–77)
RBC: 4.86 MIL/uL (ref 4.22–5.81)

## 2012-09-23 LAB — HEPATIC FUNCTION PANEL
Bilirubin, Direct: 0.1 mg/dL (ref 0.0–0.3)
Indirect Bilirubin: 0.4 mg/dL (ref 0.0–0.9)
Total Bilirubin: 0.5 mg/dL (ref 0.3–1.2)

## 2012-10-02 ENCOUNTER — Ambulatory Visit (INDEPENDENT_AMBULATORY_CARE_PROVIDER_SITE_OTHER): Payer: Medicare Other | Admitting: Family Medicine

## 2012-10-02 ENCOUNTER — Ambulatory Visit: Payer: Medicare Other | Admitting: Family Medicine

## 2012-10-02 ENCOUNTER — Encounter: Payer: Self-pay | Admitting: Family Medicine

## 2012-10-02 VITALS — BP 142/82 | HR 62 | Temp 97.6°F | Ht 65.0 in | Wt 153.1 lb

## 2012-10-02 DIAGNOSIS — I10 Essential (primary) hypertension: Secondary | ICD-10-CM

## 2012-10-02 DIAGNOSIS — E785 Hyperlipidemia, unspecified: Secondary | ICD-10-CM

## 2012-10-02 DIAGNOSIS — R7309 Other abnormal glucose: Secondary | ICD-10-CM

## 2012-10-02 DIAGNOSIS — L989 Disorder of the skin and subcutaneous tissue, unspecified: Secondary | ICD-10-CM

## 2012-10-02 DIAGNOSIS — R739 Hyperglycemia, unspecified: Secondary | ICD-10-CM

## 2012-10-02 MED ORDER — LISINOPRIL-HYDROCHLOROTHIAZIDE 20-12.5 MG PO TABS: 1.0000 | ORAL_TABLET | Freq: Every day | ORAL | Status: DC

## 2012-10-02 NOTE — Patient Instructions (Addendum)
Call if you would physical therapy Clean lesion on shoulder with [facility] Astringent and then apply Neosporin   DASH Diet The DASH diet stands for "Dietary Approaches to Stop Hypertension." It is a healthy eating plan that has been shown to reduce high blood pressure (hypertension) in as little as 14 days, while also possibly providing other significant health benefits. These other health benefits include reducing the risk of breast cancer after menopause and reducing the risk of type 2 diabetes, heart disease, colon cancer, and stroke. Health benefits also include weight loss and slowing kidney failure in patients with chronic kidney disease.  DIET GUIDELINES  Limit salt (sodium). Your diet should contain less than 1500 mg of sodium daily.  Limit refined or processed carbohydrates. Your diet should include mostly whole grains. Desserts and added sugars should be used sparingly.  Include small amounts of heart-healthy fats. These types of fats include nuts, oils, and tub margarine. Limit saturated and trans fats. These fats have been shown to be harmful in the body. CHOOSING FOODS  The following food groups are based on a 2000 calorie diet. See your Registered Dietitian for individual calorie needs. Grains and Grain Products (6 to 8 servings daily)  Eat More Often: Whole-wheat bread, brown Yust, whole-grain or wheat pasta, quinoa, popcorn without added fat or salt (air popped).  Eat Less Often: White bread, white pasta, white Palardy, cornbread. Vegetables (4 to 5 servings daily)  Eat More Often: Fresh, frozen, and canned vegetables. Vegetables may be raw, steamed, roasted, or grilled with a minimal amount of fat.  Eat Less Often/Avoid: Creamed or fried vegetables. Vegetables in a cheese sauce. Fruit (4 to 5 servings daily)  Eat More Often: All fresh, canned (in natural juice), or frozen fruits. Dried fruits without added sugar. One hundred percent fruit juice ( cup [237 mL]  daily).  Eat Less Often: Dried fruits with added sugar. Canned fruit in light or heavy syrup. [employer], Fish, and Poultry (2 servings or less daily. One serving is 3 to 4 oz [85-114 g]).  Eat More Often: Ninety percent or leaner ground beef, tenderloin, sirloin. Round cuts of beef, chicken breast, [place] breast. All fish. Grill, bake, or broil your meat. Nothing should be fried.  Eat Less Often/Avoid: Fatty cuts of meat, [place], or chicken leg, thigh, or wing. Fried cuts of meat or fish. Dairy (2 to 3 servings)  Eat More Often: Low-fat or fat-free milk, low-fat plain or light yogurt, reduced-fat or part-skim cheese.  Eat Less Often/Avoid: Milk (whole, 2%).Whole milk yogurt. Full-fat cheeses. Nuts, Seeds, and Legumes (4 to 5 servings per week)  Eat More Often: All without added salt.  Eat Less Often/Avoid: Salted nuts and seeds, canned beans with added salt. Fats and Sweets (limited)  Eat More Often: Vegetable oils, tub margarines without trans fats, sugar-free gelatin. Mayonnaise and salad dressings.  Eat Less Often/Avoid: Coconut oils, palm oils, butter, stick margarine, cream, half and half, cookies, candy, pie. FOR MORE INFORMATION The Dash Diet Eating Plan: www.dashdiet.org Document Released: 06/03/2011 Document Revised: 09/06/2011 Document Reviewed: 06/03/2011 [facility] Patient Information 2013 [place], [place].

## 2012-10-07 NOTE — Assessment & Plan Note (Signed)
Adequate number today, encouraged to minimize simple carbs and will continue to monitor

## 2012-10-07 NOTE — Progress Notes (Signed)
Patient ID: SOVEREIGN RAMIRO, male   DOB: 24-Dec-1928, 79 y.o.   MRN: [id number] ZACKARIAH VANDERPOL [id number] [birth year] 10/07/2012      Progress Note-Follow Up  Subjective  Chief Complaint  Chief Complaint  Patient presents with  . Follow-up    6 month    HPI  Patient is a 36-year-old male who is in today for six-month followup. Generally he is doing well. Notes some recent decrease in his activity level and a sense of being slightly off balance. No falls or other neurologic complaints. No incontinence, chest pain, palpitations, shortness of breath GI or GU complaints. He does note a mildly itchy scaly lesion is persistent on his trunk left side of chest wall. He is unsure how long it's been there but it's been present at least a few months. No recent illness fevers or acute complaints noted  Past Medical History  Diagnosis Date  . CAD (coronary artery disease)     s/p CABG  . HTN (hypertension)   . Hyperlipidemia   . BPH (benign prostatic hypertrophy)   . Sigmoid diverticulitis   . Internal hemorrhoids   . Basal cell carcinoma     Past Surgical History  Procedure Laterality Date  . Coronary artery bypass graft    . Colonoscopy    . Shoulder surgery      x2    Family History  Problem Relation Age of Onset  . Coronary artery disease Father   . Hyperlipidemia Father   . Bipolar disorder Mother   . Alcohol abuse      History   Social History  . Marital Status: Married    Spouse Name: N/A    Number of Children: 2  . Years of Education: N/A   Occupational History  . 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 Smoking status: Former Smoker -- 0.50 packs/day for 15 years    Quit date: 06/29/1971  . Smokeless tobacco: Never Used  . Alcohol Use: Yes     Comment: 1-2 glasses of wine  . Drug Use: Not on file  . Sexually Active: Not on file   Other Topics Concern  . Not on file   Social History Narrative  . No narrative on file     Current Outpatient Prescriptions on File Prior to Visit  Medication Sig Dispense Refill  . atenolol (TENORMIN) 25 MG tablet Take 1 tablet (25 mg total) by mouth daily.  90 tablet  3  . finasteride (PROSCAR) 5 MG tablet Take 5 mg by mouth daily.        [name] lovastatin (MEVACOR) 20 MG tablet Take 1 tablet (20 mg total) by mouth at bedtime.  90 tablet  3  . MELATONIN PO Take by mouth at bedtime as needed.      . Multiple Vitamin (MULTIVITAMIN) capsule Take 1 capsule by mouth daily.        . Multiple Vitamins-Minerals (ICAPS) CAPS Take by mouth daily.        . Omega-3 Fatty Acids (OMEGA-3 FISH OIL) 1200 MG CAPS Take 1 capsule by mouth daily.         No current facility-administered medications on file prior to visit.    No Known Allergies  Review of Systems  Review of Systems  Constitutional: Negative for fever and malaise/fatigue.  HENT: Negative for congestion.   Eyes: Negative for discharge.  Respiratory: Negative for shortness of breath.   Cardiovascular: Negative for chest pain, palpitations and leg  swelling.  Gastrointestinal: Negative for nausea, abdominal pain and diarrhea.  Genitourinary: Negative for dysuria.  Musculoskeletal: Negative for falls.  Skin: Positive for itching and rash.  Neurological: Positive for dizziness. Negative for loss of consciousness and headaches.       Sligh sense of decreased balance. No vertigo  Endo/Heme/Allergies: Negative for polydipsia.  Psychiatric/Behavioral: Negative for depression and suicidal ideas. The patient is not nervous/anxious and does not have insomnia.     Objective  BP 142/82  Pulse 62  Temp(Src) 97.6 F (36.4 C) (Oral)  Ht 5\' 5"  (1.651 m)  Wt 153 lb 1.9 oz (69.455 kg)  BMI 25.48 kg/m2  SpO2 97%  Physical Exam  Physical Exam  Constitutional: He is oriented to person, place, and time and well-developed, well-nourished, and in no distress. No distress.  HENT:  Head: Normocephalic and atraumatic.  Eyes: Conjunctivae  are normal.  Neck: Neck supple. No thyromegaly present.  Cardiovascular: Normal rate, regular rhythm and normal heart sounds.   Pulmonary/Chest: Effort normal and breath sounds normal. No respiratory distress.  Abdominal: He exhibits no distension and no mass. There is no tenderness.  Musculoskeletal: He exhibits no edema.  Neurological: He is alert and oriented to person, place, and time.  Skin: Skin is warm.  Psychiatric: Memory, affect and judgment normal.    Lab Results  Component Value Date   TSH 3.122 03/17/2011   Lab Results  Component Value Date   WBC 6.2 09/22/2012   HGB 13.4 09/22/2012   HCT 40.9 09/22/2012   MCV 84.2 09/22/2012   PLT 357 09/22/2012   Lab Results  Component Value Date   CREATININE 1.06 09/22/2012   BUN 16 09/22/2012   NA 139 09/22/2012   K 5.2 09/22/2012   CL 104 09/22/2012   CO2 26 09/22/2012   Lab Results  Component Value Date   ALT 13 09/22/2012   AST 19 09/22/2012   ALKPHOS 55 09/22/2012   BILITOT 0.5 09/22/2012   Lab Results  Component Value Date   CHOL 160 09/22/2012   Lab Results  Component Value Date   HDL 68 09/22/2012   Lab Results  Component Value Date   LDLCALC 82 09/22/2012   Lab Results  Component Value Date   TRIG 49 09/22/2012   Lab Results  Component Value Date   CHOLHDL 2.4 09/22/2012     Assessment & Plan  HYPERLIPIDEMIA Tolerating Lovastatin with good results. No changes  HYPERTENSION Adequately controlled, no changes.   Skin lesion significant sun damaged skin and a scaly lesion on his trunk referred to dermatology for furhter consideration  Hyperglycemia Adequate number today, encouraged to minimize simple carbs and will continue to monitor

## 2012-10-07 NOTE — Assessment & Plan Note (Signed)
significant sun damaged skin and a scaly lesion on his trunk referred to dermatology for furhter consideration

## 2012-10-07 NOTE — Assessment & Plan Note (Signed)
Adequately controlled, no changes 

## 2012-10-07 NOTE — Assessment & Plan Note (Signed)
Tolerating Lovastatin with good results. No changes

## 2012-11-08 ENCOUNTER — Telehealth: Payer: Self-pay | Admitting: Internal Medicine

## 2012-11-08 DIAGNOSIS — I10 Essential (primary) hypertension: Secondary | ICD-10-CM

## 2012-11-08 MED ORDER — LISINOPRIL-HYDROCHLOROTHIAZIDE 20-12.5 MG PO TABS: 1.0000 | ORAL_TABLET | Freq: Every day | ORAL | Status: DC

## 2012-11-08 NOTE — Telephone Encounter (Signed)
Patient is requesting a refill of lisinopril to be sent to [facility]

## 2012-11-16 ENCOUNTER — Ambulatory Visit: Payer: Medicare Other | Admitting: Family Medicine

## 2012-11-22 ENCOUNTER — Encounter: Payer: Self-pay | Admitting: Family Medicine

## 2012-11-22 ENCOUNTER — Ambulatory Visit (INDEPENDENT_AMBULATORY_CARE_PROVIDER_SITE_OTHER): Payer: Medicare Other | Admitting: Family Medicine

## 2012-11-22 VITALS — BP 154/82 | HR 59 | Temp 97.6°F | Ht 65.0 in | Wt 150.0 lb

## 2012-11-22 DIAGNOSIS — E785 Hyperlipidemia, unspecified: Secondary | ICD-10-CM

## 2012-11-22 DIAGNOSIS — I10 Essential (primary) hypertension: Secondary | ICD-10-CM

## 2012-11-22 DIAGNOSIS — R7309 Other abnormal glucose: Secondary | ICD-10-CM

## 2012-11-22 DIAGNOSIS — R739 Hyperglycemia, unspecified: Secondary | ICD-10-CM

## 2012-11-22 DIAGNOSIS — R42 Dizziness and giddiness: Secondary | ICD-10-CM

## 2012-11-22 LAB — RENAL FUNCTION PANEL
BUN: 13 mg/dL (ref 6–23)
Chloride: 100 mEq/L (ref 96–112)
Creat: 1.03 mg/dL (ref 0.50–1.35)
Phosphorus: 3.7 mg/dL (ref 2.3–4.6)

## 2012-11-22 MED ORDER — LISINOPRIL-HYDROCHLOROTHIAZIDE 20-12.5 MG PO TABS: 1.0000 | ORAL_TABLET | Freq: Two times a day (BID) | ORAL | Status: DC

## 2012-11-22 NOTE — Patient Instructions (Addendum)
bp check with nurse visit in roughly 2 weeks with renal panel for HTN Next visit with MD 6-10 weeks

## 2012-11-26 ENCOUNTER — Encounter: Payer: Self-pay | Admitting: Family Medicine

## 2012-11-26 NOTE — Assessment & Plan Note (Signed)
Worsening sense of usteady gait notes he does the worst when he does not have visual clues such as an all dark room or an all white room. Has been worsening for 2 years. He struggles with quick movements and stairs. Will refer to neurology for further consideration at this time

## 2012-11-26 NOTE — Progress Notes (Signed)
Patient ID: Justin [name], male   DOB: 1928/07/03, 79 y.o.   MRN: [id number] Justin [name] [id number] [birth year] 11/26/2012      Progress Note-Follow Up  Subjective  Chief Complaint  Chief Complaint  Patient presents with  . Follow-up    HPI  Patient is an 79 year old male who is in today complaining of worsening trouble with disequilibrium. He notes she's been having trouble for couple years with occasional falls and a sense of feeling dizzy but more recently has had increased difficulty with his balance. He notes he has the most trouble when doing this all dark or old white. When he looses visual cues he is down worsens. Stairs are difficult and he does not tolerate quick movement either. No recent illness. Denies chest pain palpitations, shortness of breath GI or GU concerns today. No headaches or other neurologic complaints are noted  Past Medical History  Diagnosis Date  . CAD (coronary artery disease)     s/p CABG  . HTN (hypertension)   . Hyperlipidemia   . BPH (benign prostatic hypertrophy)   . Sigmoid diverticulitis   . Internal hemorrhoids   . Basal cell carcinoma   . Disequilibrium 04/03/2012    Past Surgical History  Procedure Laterality Date  . Coronary artery bypass graft    . Colonoscopy    . Shoulder surgery      x2    Family History  Problem Relation Age of Onset  . Coronary artery disease Father   . Hyperlipidemia Father   . Bipolar disorder Mother   . Alcohol abuse      History   Social History  . Marital Status: Married    Spouse Name: N/A    Number of Children: 2  . Years of Education: N/A   Occupational History  . 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 Smoking status: Former Smoker -- 0.50 packs/day for 15 years    Quit date: 06/29/1971  . Smokeless tobacco: Never Used  . Alcohol Use: Yes     Comment: 1-2 glasses of wine  . Drug Use: Not on file  . Sexually Active: Not on file   Other Topics Concern   . Not on file   Social History Narrative  . No narrative on file    Current Outpatient Prescriptions on File Prior to Visit  Medication Sig Dispense Refill  . atenolol (TENORMIN) 25 MG tablet Take 1 tablet (25 mg total) by mouth daily.  90 tablet  3  . finasteride (PROSCAR) 5 MG tablet Take 5 mg by mouth daily.        [name] lovastatin (MEVACOR) 20 MG tablet Take 1 tablet (20 mg total) by mouth at bedtime.  90 tablet  3  . MELATONIN PO Take by mouth at bedtime as needed.      . Multiple Vitamin (MULTIVITAMIN) capsule Take 1 capsule by mouth every other day.       . Multiple Vitamins-Minerals (ICAPS) CAPS Take by mouth daily.         No current facility-administered medications on file prior to visit.    No Known Allergies  Review of Systems  Review of Systems  Constitutional: Negative for fever and malaise/fatigue.  HENT: Negative for congestion.   Eyes: Negative for discharge.  Respiratory: Negative for shortness of breath.   Cardiovascular: Negative for chest pain, palpitations and leg swelling.  Gastrointestinal: Negative for nausea, abdominal pain and diarrhea.  Genitourinary: Negative for dysuria.  Musculoskeletal: Negative for falls.  Skin: Negative for rash.  Neurological: Positive for dizziness and weakness. Negative for loss of consciousness and headaches.  Endo/Heme/Allergies: Negative for polydipsia.  Psychiatric/Behavioral: Negative for depression and suicidal ideas. The patient is not nervous/anxious and does not have insomnia.     Objective  BP 154/82  Pulse 59  Temp(Src) 97.6 F (36.4 C) (Oral)  Ht 5\' 5"  (1.651 m)  Wt 150 lb 0.6 oz (68.058 kg)  BMI 24.97 kg/m2  SpO2 97%  Physical Exam  Physical Exam  Constitutional: He is oriented to person, place, and time and well-developed, well-nourished, and in no distress. No distress.  HENT:  Head: Normocephalic and atraumatic.  Eyes: Conjunctivae are normal.  Neck: Neck supple. No thyromegaly present.   Cardiovascular: Normal rate, regular rhythm and normal heart sounds.   No murmur heard. Pulmonary/Chest: Effort normal and breath sounds normal. No respiratory distress.  Abdominal: He exhibits no distension and no mass. There is no tenderness.  Musculoskeletal: He exhibits no edema.  Neurological: He is alert and oriented to person, place, and time. No cranial nerve deficit.  pst pointing with FNF  Skin: Skin is warm.  Psychiatric: Memory, affect and judgment normal.    Lab Results  Component Value Date   TSH 2.955 11/22/2012   Lab Results  Component Value Date   WBC 6.2 09/22/2012   HGB 13.4 09/22/2012   HCT 40.9 09/22/2012   MCV 84.2 09/22/2012   PLT 357 09/22/2012   Lab Results  Component Value Date   CREATININE 1.03 11/22/2012   BUN 13 11/22/2012   NA 137 11/22/2012   K 5.3 11/22/2012   CL 100 11/22/2012   CO2 28 11/22/2012   Lab Results  Component Value Date   ALT 13 09/22/2012   AST 19 09/22/2012   ALKPHOS 55 09/22/2012   BILITOT 0.5 09/22/2012   Lab Results  Component Value Date   CHOL 160 09/22/2012   Lab Results  Component Value Date   HDL 68 09/22/2012   Lab Results  Component Value Date   LDLCALC 82 09/22/2012   Lab Results  Component Value Date   TRIG 49 09/22/2012   Lab Results  Component Value Date   CHOLHDL 2.4 09/22/2012     Assessment & Plan  HYPERTENSION Well controlled on recheck, no changes  Hyperglycemia Avoid simple carbs  HYPERLIPIDEMIA Avoid trans fats, continue Lovastatin  Disequilibrium Worsening sense of usteady gait notes he does the worst when he does not have visual clues such as an all dark room or an all white room. Has been worsening for 2 years. He struggles with quick movements and stairs. Will refer to neurology for further consideration at this time

## 2012-11-26 NOTE — Assessment & Plan Note (Signed)
Avoid trans fats, continue Lovastatin

## 2012-11-26 NOTE — Assessment & Plan Note (Signed)
Avoid simple carbs

## 2012-11-26 NOTE — Assessment & Plan Note (Signed)
Well controlled on recheck, no changes. 

## 2012-12-04 ENCOUNTER — Encounter: Payer: Self-pay | Admitting: Family Medicine

## 2012-12-04 ENCOUNTER — Ambulatory Visit (INDEPENDENT_AMBULATORY_CARE_PROVIDER_SITE_OTHER): Payer: Medicare Other | Admitting: Family Medicine

## 2012-12-04 VITALS — BP 168/90 | HR 63 | Ht 65.0 in | Wt 149.1 lb

## 2012-12-04 DIAGNOSIS — I10 Essential (primary) hypertension: Secondary | ICD-10-CM

## 2012-12-04 MED ORDER — LISINOPRIL-HYDROCHLOROTHIAZIDE 20-12.5 MG PO TABS: 1.0000 | ORAL_TABLET | Freq: Two times a day (BID) | ORAL | Status: DC

## 2012-12-04 MED ORDER — ATENOLOL 25 MG PO TABS: 25.0000 mg | ORAL_TABLET | Freq: Two times a day (BID) | ORAL | Status: DC

## 2012-12-04 NOTE — Progress Notes (Signed)
Patient ID: Justin [name], male   DOB: Mar 18, 1929, 79 y.o.   MRN: [id number] Justin [name] [id number] 21-Sep-1928 12/04/2012      Progress Note-Follow Up  Subjective  Chief Complaint  Chief Complaint  Patient presents with  . Follow-up    on BP    HPI  Patient is an 79 year old Caucasian male who is in today for blood pressure check. His blood pressure was noted to be elevated at his last visit and this regimen was altered. He has been trying to watch the sodium. He feels well. He denies any difficulty with the medications. He denies any headaches, chest pain, malaise, palpitations or shortness of breath.  Past Medical History  Diagnosis Date  . CAD (coronary artery disease)     s/p CABG  . HTN (hypertension)   . Hyperlipidemia   . BPH (benign prostatic hypertrophy)   . Sigmoid diverticulitis   . Internal hemorrhoids   . Basal cell carcinoma   . Disequilibrium 04/03/2012    Past Surgical History  Procedure Laterality Date  . Coronary artery bypass graft    . Colonoscopy    . Shoulder surgery      x2    Family History  Problem Relation Age of Onset  . Coronary artery disease Father   . Hyperlipidemia Father   . Bipolar disorder Mother   . Alcohol abuse      History   Social History  . Marital Status: Married    Spouse Name: N/A    Number of Children: 2  . Years of Education: N/A   Occupational History  . 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 Smoking status: Former Smoker -- 0.50 packs/day for 15 years    Quit date: 06/29/1971  . Smokeless tobacco: Never Used  . Alcohol Use: Yes     Comment: 1-2 glasses of wine  . Drug Use: Not on file  . Sexually Active: Not on file   Other Topics Concern  . Not on file   Social History Narrative  . No narrative on file    Current Outpatient Prescriptions on File Prior to Visit  Medication Sig Dispense Refill  . cholecalciferol (VITAMIN D) 1000 UNITS tablet Take 1,000 Units by  mouth daily.      . finasteride (PROSCAR) 5 MG tablet Take 5 mg by mouth daily.        [name] lovastatin (MEVACOR) 20 MG tablet Take 1 tablet (20 mg total) by mouth at bedtime.  90 tablet  3  . MELATONIN PO Take by mouth at bedtime as needed.      . Multiple Vitamin (MULTIVITAMIN) capsule Take 1 capsule by mouth every other day.       . Multiple Vitamins-Minerals (ICAPS) CAPS Take by mouth daily.        . multivitamin-lutein (OCUVITE-LUTEIN) CAPS Take 1 capsule by mouth daily.       No current facility-administered medications on file prior to visit.    No Known Allergies  Review of Systems  Review of Systems  Constitutional: Negative for fever and malaise/fatigue.  HENT: Negative for congestion.   Eyes: Negative for pain and discharge.  Respiratory: Negative for shortness of breath.   Cardiovascular: Negative for chest pain, palpitations and leg swelling.  Gastrointestinal: Negative for nausea, abdominal pain and diarrhea.  Genitourinary: Negative for dysuria.  Musculoskeletal: Negative for falls.  Skin: Negative for rash.  Neurological: Negative for loss of consciousness and headaches.  Endo/Heme/Allergies: Negative  for polydipsia.  Psychiatric/Behavioral: Negative for depression and suicidal ideas. The patient is not nervous/anxious and does not have insomnia.     Objective  BP 170/88  Pulse 63  Ht 5\' 5"  (1.651 m)  Wt 149 lb 1.9 oz (67.64 kg)  BMI 24.81 kg/m2  SpO2 96%  Physical Exam  Physical Exam  Constitutional: He is oriented to person, place, and time and well-developed, well-nourished, and in no distress. No distress.  HENT:  Head: Normocephalic and atraumatic.  Eyes: Conjunctivae are normal.  Neck: Neck supple. No thyromegaly present.  Cardiovascular: Normal rate and regular rhythm.   Murmur heard. 2/6 M, sys  Pulmonary/Chest: Effort normal and breath sounds normal. No respiratory distress.  Abdominal: He exhibits no distension and no mass. There is no  tenderness.  Musculoskeletal: He exhibits no edema.  Neurological: He is alert and oriented to person, place, and time.  Skin: Skin is warm.  Psychiatric: Memory, affect and judgment normal.    Lab Results  Component Value Date   TSH 2.955 11/22/2012   Lab Results  Component Value Date   WBC 6.2 09/22/2012   HGB 13.4 09/22/2012   HCT 40.9 09/22/2012   MCV 84.2 09/22/2012   PLT 357 09/22/2012   Lab Results  Component Value Date   CREATININE 1.03 11/22/2012   BUN 13 11/22/2012   NA 137 11/22/2012   K 5.3 11/22/2012   CL 100 11/22/2012   CO2 28 11/22/2012   Lab Results  Component Value Date   ALT 13 09/22/2012   AST 19 09/22/2012   ALKPHOS 55 09/22/2012   BILITOT 0.5 09/22/2012   Lab Results  Component Value Date   CHOL 160 09/22/2012   Lab Results  Component Value Date   HDL 68 09/22/2012   Lab Results  Component Value Date   LDLCALC 82 09/22/2012   Lab Results  Component Value Date   TRIG 49 09/22/2012   Lab Results  Component Value Date   CHOLHDL 2.4 09/22/2012     Assessment & Plan  HYPERTENSION Unfortunately bp still up. Has been taking both of his Lisinoprilhct at the same time, willswitch them to bid and increase Atenolol to 25 mg bid and then have bp check in 4 weeks or sooner as needed, has been trying to cut down on salt

## 2012-12-04 NOTE — Patient Instructions (Addendum)
Next visit nurse visit for BP check   Hypertension As your heart beats, it forces blood through your arteries. This force is your blood pressure. If the pressure is too high, it is called hypertension (HTN) or high blood pressure. HTN is dangerous because you may have it and not know it. High blood pressure may mean that your heart has to work harder to pump blood. Your arteries may be narrow or stiff. The extra work puts you at risk for heart disease, stroke, and other problems.  Blood pressure consists of two numbers, a higher number over a lower, 110/72, for example. It is stated as "110 over 72." The ideal is below 120 for the top number (systolic) and under 80 for the bottom (diastolic). Write down your blood pressure today. You should pay close attention to your blood pressure if you have certain conditions such as:  Heart failure.  Prior heart attack.  Diabetes  Chronic kidney disease.  Prior stroke.  Multiple risk factors for heart disease. To see if you have HTN, your blood pressure should be measured while you are seated with your arm held at the level of the heart. It should be measured at least twice. A one-time elevated blood pressure reading (especially in the Emergency Department) does not mean that you need treatment. There may be conditions in which the blood pressure is different between your right and left arms. It is important to see your caregiver soon for a recheck. Most people have essential hypertension which means that there is not a specific cause. This type of high blood pressure may be lowered by changing lifestyle factors such as:  Stress.  Smoking.  Lack of exercise.  Excessive weight.  Drug/tobacco/alcohol use.  Eating less salt. Most people do not have symptoms from high blood pressure until it has caused damage to the body. Effective treatment can often prevent, delay or reduce that damage. TREATMENT  When a cause has been identified, treatment for  high blood pressure is directed at the cause. There are a large number of medications to treat HTN. These fall into several categories, and your caregiver will help you select the medicines that are best for you. Medications may have side effects. You should review side effects with your caregiver. If your blood pressure stays high after you have made lifestyle changes or started on medicines,   Your medication(s) may need to be changed.  Other problems may need to be addressed.  Be certain you understand your prescriptions, and know how and when to take your medicine.  Be sure to follow up with your caregiver within the time frame advised (usually within two weeks) to have your blood pressure rechecked and to review your medications.  If you are taking more than one medicine to lower your blood pressure, make sure you know how and at what times they should be taken. Taking two medicines at the same time can result in blood pressure that is too low. SEEK IMMEDIATE MEDICAL CARE IF:  You develop a severe headache, blurred or changing vision, or confusion.  You have unusual weakness or numbness, or a faint feeling.  You have severe chest or abdominal pain, vomiting, or breathing problems. MAKE SURE YOU:   Understand these instructions.  Will watch your condition.  Will get help right away if you are not doing well or get worse. Document Released: 06/14/2005 Document Revised: 09/06/2011 Document Reviewed: 02/02/2008 [facility] Patient Information 2014 [place], [place].

## 2012-12-04 NOTE — Progress Notes (Signed)
  Subjective:    Patient ID: Justin [name], male    DOB: [birth year], 79 y.o.   MRN: [id number]  HPI    Review of Systems     Objective:   Physical Exam        Assessment & Plan:  Pt came in today for his BP check

## 2012-12-06 NOTE — Assessment & Plan Note (Addendum)
Unfortunately bp still up. Has been taking both of his Lisinoprilhct at the same time, willswitch them to bid and increase Atenolol to 25 mg bid and then have bp check in 4 weeks or sooner as needed, has been trying to cut down on salt

## 2012-12-06 NOTE — Progress Notes (Signed)
Patient ID: Justin [name], male   DOB: 02-15-1929, 79 y.o.   MRN: [id number] See other note for visit summary

## 2013-01-01 ENCOUNTER — Ambulatory Visit: Payer: Medicare Other | Admitting: Neurology

## 2013-01-01 ENCOUNTER — Ambulatory Visit (INDEPENDENT_AMBULATORY_CARE_PROVIDER_SITE_OTHER): Payer: Medicare Other | Admitting: Neurology

## 2013-01-01 ENCOUNTER — Encounter: Payer: Self-pay | Admitting: Neurology

## 2013-01-01 VITALS — BP 144/74 | HR 54 | Temp 97.5°F | Ht 65.0 in | Wt 149.0 lb

## 2013-01-01 DIAGNOSIS — Z9181 History of falling: Secondary | ICD-10-CM

## 2013-01-01 DIAGNOSIS — R2681 Unsteadiness on feet: Secondary | ICD-10-CM

## 2013-01-01 DIAGNOSIS — R42 Dizziness and giddiness: Secondary | ICD-10-CM

## 2013-01-01 DIAGNOSIS — R269 Unspecified abnormalities of gait and mobility: Secondary | ICD-10-CM

## 2013-01-01 LAB — FOLATE: Folate: >24.8 ng/mL (ref >5.9)

## 2013-01-01 NOTE — Patient Instructions (Addendum)
I think your balance problems are due to your nerves in the feet (peripheral neuropathy).  At this point, it doesn't seem to be coming from your brain.  I think we first need to evaluate for peripheral neuropathy. 1.  Nerve conduction study/EMG of the legs to look for evidence of neuropathy. 2.  Check labs for common causes of neuropathy (B12, methylmalonic acid, ANA, sed rate, 2 hour glucose tolerance test). 3.  Follow up after EMG. 4.  Physical therapy for balance and walking.  Please have your fasting glucose tolerance lab work drawn at [address] in the basement of the [facility]. It is located right across the street from [employer] hours are from 7:30 am to 5:30 pm Monday through Friday.  We will refer [facility] to [facility] located at [address] in [place] for your physical therapy. They will call you to make the appointment.   [phone].  We will call you to schedule the NCV/EMG test. It will be done in August in our office.  Follow up with Dr. Everlena Cooper after the NCV/EMG test is completed.

## 2013-01-01 NOTE — Progress Notes (Signed)
NEUROLOGY CONSULTATION NOTE  RED MANDT MRN: [id number] DOB: 1929-02-08  Referring physician: Dr. Abner Greenspan Primary care physician: Dr. Abner Greenspan  Reason for consult:  Gait instability  HISTORY OF PRESENT ILLNESS: Justin [name] is a 79 y.o. male with hypertension, hyperlipidemia and hyperglycemia, who presents with 2-3 year history of balance problems, worse over the past year.he specifically notes problems with balance, especially when his eyes are closed. When he washes his hair in the shower and closes his eyes, he needs to hang onto something. Also, when he stands for a few minutes in one place, he begins to lose balance. He also notes trouble walking down hills as well as walking up stairs. He is very active and takes frequent walks and plays golf. Sometimes when he is playing golf, he feels unsteady on the hills. When he walks, he will sometimes stumble, as if his foot gets caught on the ground. He notices the problem more in the left leg than the right leg. He also has chronic problems with his left foot though, such as bunions. He denies any neck pain or lower back pain radiating down the legs. He denies any sensation of numbness and tingling. He denies any weakness or focal weakness. He denies any bowel or bladder dysfunction. He's had approximately 4 falls over the last 2 years. His last fall was approximately 3 weeks ago while walking on a sharp incline. When he closes his eyes, he has a sensation of sway, but no actual vertigo or fullness in his head. He does have occasional tinnitus and wears hearing aids. He has not had any sensation of passing out.  He does not use any assisted device or has been to physical therapy.  Recent labs reveal TSH 2.955 (11/22/12), LDL 82 (09/22/12), fasting glucose 99 (09/22/12), Hgb A1c 5.9 (12/06/11).  PAST MEDICAL HISTORY: Past Medical History  Diagnosis Date  . CAD (coronary artery disease)     s/p CABG  . HTN (hypertension)   . Hyperlipidemia   . BPH  (benign prostatic hypertrophy)   . Sigmoid diverticulitis   . Internal hemorrhoids   . Basal cell carcinoma   . Disequilibrium 04/03/2012    PAST SURGICAL HISTORY: Past Surgical History  Procedure Laterality Date  . Coronary artery bypass graft    . Colonoscopy    . Shoulder surgery      x2    MEDICATIONS: Current Outpatient Prescriptions on File Prior to Visit  Medication Sig Dispense Refill  . atenolol (TENORMIN) 25 MG tablet Take 1 tablet (25 mg total) by mouth 2 (two) times daily.  180 tablet  1  . cholecalciferol (VITAMIN D) 1000 UNITS tablet Take 1,000 Units by mouth daily.      . finasteride (PROSCAR) 5 MG tablet Take 5 mg by mouth daily.        [name] lisinopril-hydrochlorothiazide (ZESTORETIC) 20-12.5 MG per tablet Take 1 tablet by mouth 2 (two) times daily.  180 tablet  1  . lovastatin (MEVACOR) 20 MG tablet Take 1 tablet (20 mg total) by mouth at bedtime.  90 tablet  3  . MELATONIN PO Take by mouth at bedtime as needed.      . Multiple Vitamin (MULTIVITAMIN) capsule Take 1 capsule by mouth every other day.       . Multiple Vitamins-Minerals (ICAPS) CAPS Take by mouth daily.        . multivitamin-lutein (OCUVITE-LUTEIN) CAPS Take 1 capsule by mouth daily.       No current facility-administered medications  on file prior to visit.    ALLERGIES: No Known Allergies  FAMILY HISTORY: Family History  Problem Relation Age of Onset  . Coronary artery disease Father   . Hyperlipidemia Father   . Bipolar disorder Mother   . Alcohol abuse      SOCIAL HISTORY: History   Social History  . Marital Status: Married    Spouse Name: N/A    Number of Children: 2  . Years of Education: N/A   Occupational History  . 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 Smoking status: Former Smoker -- 0.50 packs/day for 15 years    Quit date: 06/29/1971  . Smokeless tobacco: Never Used  . Alcohol Use: Yes     Comment: 1-2 glasses of wine  . Drug Use: No   . Sexually Active: Not on file   Other Topics Concern  . Not on file   Social History Narrative  . No narrative on file    REVIEW OF SYSTEMS: Constitutional: No fevers, chills, or sweats, no generalized fatigue, change in appetite Eyes: No visual changes, double vision, eye pain Ear, nose and throat: No hearing loss, ear pain, nasal congestion, sore throat Cardiovascular: No chest pain, palpitations Respiratory:  No shortness of breath at rest or with exertion, wheezes GastrointestinaI: No nausea, vomiting, diarrhea, abdominal pain, fecal incontinence Genitourinary:  No dysuria, urinary retention or frequency Musculoskeletal:  No neck pain, back pain Integumentary: No rash, pruritus, skin lesions Neurological: as above Psychiatric: No depression, insomnia, anxiety Endocrine: No palpitations, fatigue, diaphoresis, mood swings, change in appetite, change in weight, increased thirst Hematologic/Lymphatic:  No anemia, purpura, petechiae. Allergic/Immunologic: no itchy/runny eyes, nasal congestion, recent allergic reactions, rashes  PHYSICAL EXAM: Filed Vitals:   01/01/13 1023  BP: 144/74  Pulse: 54  Temp: 97.5 F (36.4 C)   General: No acute distress Head:  Normocephalic/atraumatic Neck: supple, no paraspinal tenderness, full range of motion Back: No paraspinal tenderness Heart: regular rate and rhythm Lungs: Clear to auscultation bilaterally. Neurological Exam: Mental status: alert and oriented to person, place, time and self, speech fluent and not dysarthric, language intact. Cranial nerves: CN I: not tested CN II: pupils equal, round and reactive to light, visual fields intact, fundi unremarkable CN III, IV, VI:  full range of motion, no nystagmus, no ptosis CN V: facial sensation intact CN VII: upper and lower face symmetric CN VIII: hearing intact CN IX, X: gag intact, uvula midline CN XI: sternocleidomastoid and trapezius muscles intact CN XII: tongue  midline Bulk & Tone: normal, no fasciculations. Muscle strength: 5/5 throughout Sensation: reduced vibration sensation in the feet bilaterally.  Pinprick and proprioception intact. Deep Tendon Reflexes: 2+ in upper extremities and symmetric, 2+ in patellars, 1+ left ankle, absent right ankle, toes down-going Finger to nose testing: without dysmetria Heel to shin: without dysmetria Gait: wide based, stumbles a bit, unable to walk in tandem. Romberg with sway.  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 male with gait instability, likely secondary to reduced proprioception from a peripheral neuropathy.  Etiology unknown.  Even though he does not have diabetes, impaired blood glucose may contribute to neuropathy.  At this point, there is nothing on exam to suggest intracranial lesion or spinal stenosis.  He does feel that his left foot is a little worse.  That may be complicated by his previous foot problems, however.  Further testing will be needed to sort this out. 1.  NCS/EMG to look for  evidence of peripheral neuropathy 2.  Labs to look for common causes of neuropathy: 2 hr glucose tolerance test, B12, methylmalonic acid, SPEP/IFE, ANA, ESR, RPR. 3.  We will refer him to physical therapy for gait instability 4.  Follow up following EMG.  Thank you for allowing me to take part in the care of this patient.  Shon Millet, DO  CC: Danise Edge, MD

## 2013-01-03 LAB — PROTEIN ELECTROPHORESIS, SERUM
Albumin ELP: 55.9 % (ref 55.8–66.1)
Beta 2: 5.7 % (ref 3.2–6.5)
Gamma Globulin: 16.7 % (ref 11.1–18.8)

## 2013-01-05 ENCOUNTER — Ambulatory Visit (INDEPENDENT_AMBULATORY_CARE_PROVIDER_SITE_OTHER): Payer: Medicare Other | Admitting: *Deleted

## 2013-01-05 VITALS — BP 142/78 | HR 52

## 2013-01-05 DIAGNOSIS — I10 Essential (primary) hypertension: Secondary | ICD-10-CM

## 2013-01-05 NOTE — Progress Notes (Signed)
  Subjective:    Patient ID: Justin [name], male    DOB: Nov 15, 1928, 79 y.o.   MRN: [id number]  HPI    Review of Systems     Objective:   Physical Exam        Assessment & Plan:  Patient presented for BP check, still elevated but better than previous; pt has seen Neurology since last PCP visit for lightheadedness, dizziness & falls/SLS Information forwarded to provider in office, Maximino Sarin, NP; no changes at this time; pt informed.

## 2013-01-08 ENCOUNTER — Ambulatory Visit: Payer: Medicare Other | Admitting: Family Medicine

## 2013-01-12 ENCOUNTER — Ambulatory Visit: Payer: Medicare Other | Attending: Neurology | Admitting: Rehabilitation

## 2013-01-12 DIAGNOSIS — R262 Difficulty in walking, not elsewhere classified: Secondary | ICD-10-CM | POA: Insufficient documentation

## 2013-01-12 DIAGNOSIS — IMO0001 Reserved for inherently not codable concepts without codable children: Secondary | ICD-10-CM | POA: Insufficient documentation

## 2013-01-15 ENCOUNTER — Ambulatory Visit: Payer: Medicare Other | Admitting: Rehabilitation

## 2013-01-17 ENCOUNTER — Other Ambulatory Visit: Payer: Medicare Other

## 2013-01-17 ENCOUNTER — Telehealth: Payer: Self-pay | Admitting: Neurology

## 2013-01-17 ENCOUNTER — Ambulatory Visit: Payer: Medicare Other | Admitting: Rehabilitation

## 2013-01-17 DIAGNOSIS — R269 Unspecified abnormalities of gait and mobility: Secondary | ICD-10-CM

## 2013-01-17 DIAGNOSIS — R42 Dizziness and giddiness: Secondary | ICD-10-CM

## 2013-01-17 DIAGNOSIS — Z9181 History of falling: Secondary | ICD-10-CM

## 2013-01-17 LAB — GLUCOSE TOLERANCE, 2 HOURS
Glucose, 1 Hour GTT: 136 mg/dL
Glucose, 2 hour: 116 mg/dL
Glucose, Fasting: 100 mg/dL — ABNORMAL HIGH (ref 70–99)

## 2013-01-17 NOTE — Telephone Encounter (Signed)
Message copied by Benay Spice on Wed Jan 17, 2013 12:43 PM ------      Message from: JAFFE, ADAM R      Created: Wed Jan 17, 2013 12:31 PM       Lab work looks okay.  Next step is to see what the EMG shows.      AJ      ----- Message -----         From: [facility] Interface         Sent: 01/17/2013  11:49 AM           To: Cira Servant, DO                   ------

## 2013-01-17 NOTE — Telephone Encounter (Signed)
Spoke with the patient. Information given as per Dr. Everlena Cooper re: lab results. No questions/concerns voiced at this time.

## 2013-01-29 ENCOUNTER — Ambulatory Visit: Payer: Medicare Other | Attending: Neurology | Admitting: Rehabilitation

## 2013-01-29 DIAGNOSIS — IMO0001 Reserved for inherently not codable concepts without codable children: Secondary | ICD-10-CM | POA: Insufficient documentation

## 2013-01-29 DIAGNOSIS — R262 Difficulty in walking, not elsewhere classified: Secondary | ICD-10-CM | POA: Insufficient documentation

## 2013-01-31 ENCOUNTER — Ambulatory Visit: Payer: Medicare Other | Admitting: Rehabilitation

## 2013-02-01 ENCOUNTER — Encounter: Payer: Self-pay | Admitting: Physician Assistant

## 2013-02-01 ENCOUNTER — Ambulatory Visit (INDEPENDENT_AMBULATORY_CARE_PROVIDER_SITE_OTHER): Payer: Medicare Other | Admitting: Physician Assistant

## 2013-02-01 VITALS — BP 160/82 | HR 53 | Temp 97.5°F | Resp 14 | Wt 147.0 lb

## 2013-02-01 DIAGNOSIS — R1013 Epigastric pain: Secondary | ICD-10-CM

## 2013-02-01 DIAGNOSIS — I10 Essential (primary) hypertension: Secondary | ICD-10-CM

## 2013-02-01 LAB — LIPASE: Lipase: 27 U/L (ref 0–75)

## 2013-02-01 NOTE — Patient Instructions (Signed)
Please obtain fasting labs.  I will call you with the results.  Try some OTC tums or zantac to see if it alleviates symptoms.  If pain persists or worsens please return to office or go to ED if pain is severe.  Gastroesophageal Reflux Disease, Adult Gastroesophageal reflux disease (GERD) happens when acid from your stomach flows up into the esophagus. When acid comes in contact with the esophagus, the acid causes soreness (inflammation) in the esophagus. Over time, GERD may create small holes (ulcers) in the lining of the esophagus. CAUSES   Increased body weight. This puts pressure on the stomach, making acid rise from the stomach into the esophagus.  Smoking. This increases acid production in the stomach.  Drinking alcohol. This causes decreased pressure in the lower esophageal sphincter (valve or ring of muscle between the esophagus and stomach), allowing acid from the stomach into the esophagus.  Late evening meals and a full stomach. This increases pressure and acid production in the stomach.  A malformed lower esophageal sphincter. Sometimes, no cause is found. SYMPTOMS   Burning pain in the lower part of the mid-chest behind the breastbone and in the mid-stomach area. This may occur twice a week or more often.  Trouble swallowing.  Sore throat.  Dry cough.  Asthma-like symptoms including chest tightness, shortness of breath, or wheezing. DIAGNOSIS  Your caregiver may be able to diagnose GERD based on your symptoms. In some cases, X-rays and other tests may be done to check for complications or to check the condition of your stomach and esophagus. TREATMENT  Your caregiver may recommend over-the-counter or prescription medicines to help decrease acid production. Ask your caregiver before starting or adding any new medicines.  HOME CARE INSTRUCTIONS   Change the factors that you can control. Ask your caregiver for guidance concerning weight loss, quitting smoking, and alcohol  consumption.  Avoid foods and drinks that make your symptoms worse, such as:  Caffeine or alcoholic drinks.  Chocolate.  Peppermint or mint flavorings.  Garlic and onions.  Spicy foods.  Citrus fruits, such as oranges, lemons, or limes.  Tomato-based foods such as sauce, chili, salsa, and pizza.  Fried and fatty foods.  Avoid lying down for the 3 hours prior to your bedtime or prior to taking a nap.  Eat small, frequent meals instead of large meals.  Wear loose-fitting clothing. Do not wear anything tight around your waist that causes pressure on your stomach.  Raise the head of your bed 6 to 8 inches with wood blocks to help you sleep. Extra pillows will not help.  Only take over-the-counter or prescription medicines for pain, discomfort, or fever as directed by your caregiver.  Do not take aspirin, ibuprofen, or other nonsteroidal anti-inflammatory drugs (NSAIDs). SEEK IMMEDIATE MEDICAL CARE IF:   You have pain in your arms, neck, jaw, teeth, or back.  Your pain increases or changes in intensity or duration.  You develop nausea, vomiting, or sweating (diaphoresis).  You develop shortness of breath, or you faint.  Your vomit is green, yellow, black, or looks like coffee grounds or blood.  Your stool is red, bloody, or black. These symptoms could be signs of other problems, such as heart disease, gastric bleeding, or esophageal bleeding. MAKE SURE YOU:   Understand these instructions.  Will watch your condition.  Will get help right away if you are not doing well or get worse. Document Released: 03/24/2005 Document Revised: 09/06/2011 Document Reviewed: 01/01/2011 [facility] Patient Information 2014 [place], [place].

## 2013-02-01 NOTE — Progress Notes (Signed)
Patient ID: Justin [name], male   DOB: 03-06-1929, 79 y.o.   MRN: [id number]   Patient is a 79 year-old male with hx of HTN who presents today c/o intermittent abdominal pain x 1 day.  Patient started noticing achy pain in abdomen above his belly button this am.  Pain is 4/10.  Pain is non-radiating.  Pain is not associated with change in posture.  Pain not associated with meal intake. Denies hx heartburn or acid reflux.  Denies prior abdominal surgery.  Denies fever, N/V/C/D.  Last bowel movement was this am.  Denies hematochezia, melena or tenesmus.  Denies chest pain or shortness of breath.  Denies palpitations.  Denies history of gallstones.  Denies excessive alcohol consumption.  Last alcohol intake was last night -- 1 glass.    Past Medical History  Diagnosis Date  . CAD (coronary artery disease)     s/p CABG  . HTN (hypertension)   . Hyperlipidemia   . BPH (benign prostatic hypertrophy)   . Sigmoid diverticulitis   . Internal hemorrhoids   . Basal cell carcinoma   . Disequilibrium 04/03/2012   Current Outpatient Prescriptions on File Prior to Visit  Medication Sig Dispense Refill  . atenolol (TENORMIN) 25 MG tablet Take 1 tablet (25 mg total) by mouth 2 (two) times daily.  180 tablet  1  . cholecalciferol (VITAMIN D) 1000 UNITS tablet Take 1,000 Units by mouth daily.      . finasteride (PROSCAR) 5 MG tablet Take 5 mg by mouth daily.        [name] lisinopril-hydrochlorothiazide (ZESTORETIC) 20-12.5 MG per tablet Take 1 tablet by mouth 2 (two) times daily.  180 tablet  1  . lovastatin (MEVACOR) 20 MG tablet Take 1 tablet (20 mg total) by mouth at bedtime.  90 tablet  3  . MELATONIN PO Take by mouth at bedtime as needed.      . Multiple Vitamin (MULTIVITAMIN) capsule Take 1 capsule by mouth every other day.       . multivitamin-lutein (OCUVITE-LUTEIN) CAPS Take 1 capsule by mouth daily.       No current facility-administered medications on file prior to visit.   No Known  Allergies  Family History  Problem Relation Age of Onset  . Coronary artery disease Father   . Hyperlipidemia Father   . Bipolar disorder Mother   . Alcohol abuse     History   Social History  . Marital Status: Married    Spouse Name: N/A    Number of Children: 2  . Years of Education: N/A   Occupational History  . 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 Smoking status: Former Smoker -- 0.50 packs/day for 15 years    Quit date: 06/29/1971  . Smokeless tobacco: Never Used  . Alcohol Use: Yes     Comment: 1-2 glasses of wine  . Drug Use: No  . Sexually Active: None   Other Topics Concern  . None   Social History Narrative  . None   Review of Systems  Constitutional: Negative for fever, chills, weight loss and malaise/fatigue.  Eyes: Negative for blurred vision and double vision.  Respiratory: Negative for cough, hemoptysis, shortness of breath and wheezing.   Cardiovascular: Negative for chest pain and palpitations.  Gastrointestinal: Positive for abdominal pain. Negative for heartburn, nausea, vomiting, diarrhea, constipation, blood in stool and melena.  Genitourinary: Negative for dysuria, urgency, frequency, hematuria and flank pain.  Musculoskeletal: Negative for myalgias.  Neurological: Positive for dizziness. Negative for headaches.     Filed Vitals:   02/01/13 1324  BP: 160/82  Pulse: 53  Temp: 97.5 F (36.4 C)  Resp: 14    Physical Exam  Constitutional: He is oriented to person, place, and time and well-developed, well-nourished, and in no distress.  HENT:  Head: Normocephalic and atraumatic.  Eyes: Conjunctivae are normal. Pupils are equal, round, and reactive to light.  Neck: Normal range of motion. Neck supple.  Cardiovascular: Normal rate, regular rhythm and intact distal pulses.   Murmur heard.  Systolic murmur is present with a grade of 2/6  Pulmonary/Chest: Effort normal and breath sounds normal. No  respiratory distress. He has no wheezes. He has no rales.  Abdominal: Soft. Bowel sounds are normal. He exhibits no distension and no mass. There is no rebound and no guarding.  + epigastric tenderness  Lymphadenopathy:    He has no cervical adenopathy.  Neurological: He is alert and oriented to person, place, and time.  Skin: Skin is warm and dry. No rash noted.   Assessment/Plan: Epigastric pain Most likely transient discomfort.  Use OTC Tums or Zantac to help alleviate pain.  Will obtain lipase and h. Pylori test to rule out other pathology.  Patient to return to clinic if symptoms persist.  Educated patient on alarm symptoms and to proceed directly to ED if they occur.  HYPERTENSION Patient asymptomatic.  Patient to take BP medication as prescribed. Continue BP monitoring at home.  Has follow-up with Dr. Abner Greenspan.

## 2013-02-01 NOTE — Assessment & Plan Note (Signed)
Most likely transient discomfort.  Use OTC Tums or Zantac to help alleviate pain.  Will obtain lipase and h. Pylori test to rule out other pathology.  Patient to return to clinic if symptoms persist.  Educated patient on alarm symptoms and to proceed directly to ED if they occur.

## 2013-02-01 NOTE — Assessment & Plan Note (Signed)
Patient asymptomatic.  Patient to take BP medication as prescribed. Continue BP monitoring at home.  Has follow-up with Dr. Abner Greenspan.

## 2013-02-05 ENCOUNTER — Ambulatory Visit: Payer: Medicare Other | Admitting: Rehabilitation

## 2013-02-07 ENCOUNTER — Ambulatory Visit: Payer: Medicare Other | Admitting: Rehabilitation

## 2013-02-12 ENCOUNTER — Ambulatory Visit: Payer: Medicare Other | Admitting: Rehabilitation

## 2013-02-14 ENCOUNTER — Ambulatory Visit: Payer: Medicare Other | Admitting: Rehabilitation

## 2013-02-19 ENCOUNTER — Ambulatory Visit (INDEPENDENT_AMBULATORY_CARE_PROVIDER_SITE_OTHER): Payer: Medicare Other | Admitting: Neurology

## 2013-02-19 ENCOUNTER — Encounter: Payer: Self-pay | Admitting: Neurology

## 2013-02-19 DIAGNOSIS — R29818 Other symptoms and signs involving the nervous system: Secondary | ICD-10-CM

## 2013-02-19 DIAGNOSIS — R2689 Other abnormalities of gait and mobility: Secondary | ICD-10-CM

## 2013-02-19 NOTE — Procedures (Signed)
[facility]  [address] [place], [address]  [place], [place] [address] Tel: [phone] Fax:  [phone]  Test Date:  02/19/2013  Patient: Justin [name] DOB: [birth year] Physician: Shon Millet, DO  Sex: Male Height: 5\' 5"  Ref Phys: Dr. Everlena Cooper  ID#: [id number]   Technician:    Patient Complaints:   79 year old gentleman with history of balance problems.  The patient's identification is ascertained.  The EMG procedure is explained to the patient in detail who appears to comprehend the discussion and verbal consent is obtained.  NCV & EMG Findings: The motor conduction of the right peroneal nerve shows normal distal latency and borderline slowed velocity with normal amplitude of the evoked response; that of the right tibial nerve shows mildly prolonged distal latency and normal velocity with normal amplitude of the evoked response.  The sensory conduction of the right sural nerve is normal.  There were no abnormalities on the needle electrode examination, when testing the right tibialis anterior, gastrocnemius, vastus medialis, iliopsoas and gluteus medius muscles.  As the limb muscles were normal, the paraspinal muscle was not tested.  As the right side was negative and his symptoms symmetric, the left side was not tested.  Impression: This is an essentially normal EMG.  There is no electrodiagnostic evidence of a peripheral neuropathy or right L2-S1 radiculopathy.  We are unable to explain the patient's symptoms on the above EMG findings.   ___________________________ Shon Millet, DO    Nerve Conduction Studies Anti Sensory Summary Table   Site NR Peak (ms) Norm Peak (ms) P-T Amp (V) Norm P-T Amp  Right Sural Anti Sensory (Lat Mall)  Calf    4.2 <4.4 8.9 >6   Motor Summary Table   Site NR Onset (ms) Norm Onset (ms) O-P Amp (mV) Norm O-P Amp Site1 Site2 Delta-0 (ms) Dist (cm) Vel (m/s) Norm Vel (m/s)  Right Peroneal Motor (Ext Dig Brev)  Ankle    4.6 <6.1 6.5 >2 B Fib  Ankle 6.9 27.5 40 >41  B Fib    11.5  5.2  Poplt B Fib 2.6 10.0 38 >41  Poplt    14.1  5.2         Right Tibial Motor (Abd Hall Brev)  Ankle    6.8 <6.1 6.8 >3.0 Knee Ankle 6.5 39.5 61 >41  Knee    13.3  5.7          EMG   Side Muscle Nerve Root Ins Act Fibs Psw Amp Dur Poly Recrt Int Dennie Bible Comment  Right AntTibialis Dp Br Fibular L4-5 Nml Nml Nml Nml Nml 0 Nml Nml   Right Gastroc Tibial S1-2 Nml Nml Nml Nml Nml 0 Nml Nml   Right VastusMed Femoral L2-4 Nml Nml Nml Nml Nml 0 Nml Nml   Right Iliopsoas Femoral L2-3 Nml Nml Nml Nml Nml 0 Nml Nml   Right GluteusMed SupGluteal L5-S1 Nml Nml Nml Nml Nml 0 Nml Nml       Waveforms:

## 2013-02-19 NOTE — Progress Notes (Signed)
See EMG procedure report.  EMG is essentially normal.  Unable to determine neurological etiology, although his clinical history does suggest polyneuropathy.  It would not change management anyway however.  Management is purely supportive.  He has been going to PT and has had significant improvement.  Would continue to monitor for now.

## 2013-03-26 ENCOUNTER — Telehealth: Payer: Self-pay | Admitting: Family Medicine

## 2013-03-26 DIAGNOSIS — I10 Essential (primary) hypertension: Secondary | ICD-10-CM

## 2013-03-26 MED ORDER — LISINOPRIL-HYDROCHLOROTHIAZIDE 20-12.5 MG PO TABS: 1.0000 | ORAL_TABLET | Freq: Two times a day (BID) | ORAL | Status: DC

## 2013-03-26 NOTE — Telephone Encounter (Signed)
Just want to check with you to make sure this all right to fill? Please advise?

## 2013-03-26 NOTE — Telephone Encounter (Signed)
Patient states that he only has two pills of lisinopril left. He would like an emergency supply sent to [facility] on [address] but would also like a 90 day supply sent to [facility] pharmacy.

## 2013-03-26 NOTE — Telephone Encounter (Signed)
I am confused I sent a 90 day supply with refill in June? Of his LIsinopril HCT, OK to send emergency 30 day supply and 90 day supply with 1 refill to mail order if they do not have it.

## 2013-05-03 ENCOUNTER — Other Ambulatory Visit: Payer: Self-pay

## 2013-05-28 ENCOUNTER — Encounter: Payer: Self-pay | Admitting: *Deleted

## 2013-05-28 ENCOUNTER — Encounter: Payer: Self-pay | Admitting: Cardiology

## 2013-05-28 ENCOUNTER — Ambulatory Visit (INDEPENDENT_AMBULATORY_CARE_PROVIDER_SITE_OTHER): Payer: Medicare Other | Admitting: Cardiology

## 2013-05-28 VITALS — BP 134/82 | HR 52 | Ht 65.0 in | Wt 151.0 lb

## 2013-05-28 DIAGNOSIS — I2510 Atherosclerotic heart disease of native coronary artery without angina pectoris: Secondary | ICD-10-CM

## 2013-05-28 DIAGNOSIS — I350 Nonrheumatic aortic (valve) stenosis: Secondary | ICD-10-CM

## 2013-05-28 DIAGNOSIS — E785 Hyperlipidemia, unspecified: Secondary | ICD-10-CM

## 2013-05-28 DIAGNOSIS — R29898 Other symptoms and signs involving the musculoskeletal system: Secondary | ICD-10-CM

## 2013-05-28 DIAGNOSIS — I10 Essential (primary) hypertension: Secondary | ICD-10-CM

## 2013-05-28 DIAGNOSIS — I359 Nonrheumatic aortic valve disorder, unspecified: Secondary | ICD-10-CM

## 2013-05-28 LAB — BASIC METABOLIC PANEL
CO2: 29 mEq/L (ref 19–32)
Chloride: 94 mEq/L — ABNORMAL LOW (ref 96–112)
Sodium: 128 mEq/L — ABNORMAL LOW (ref 135–145)

## 2013-05-28 NOTE — Progress Notes (Signed)
Patient ID: Justin [name], male   DOB: 1928-09-05, 79 y.o.   MRN: [id number] PCP: Dr. Abner Greenspan  79 yo with history of CAD s/p CABG presents for cardiology followup.  He has been seen by Drs Riley Kill and Tysinger in the past and is seen by me for the first time today.  In general, he has been doing well.  He is retired.  He walks for about a mile 3-4 times a week and works out with weights 2-3 times a week.  He plays golf.  No exertional dyspnea or chest pain.  No tachypalpitations.  He does report that his legs will tire quickly with exertion but there is no pain.  He has balance difficulty and had a recent evaluation by neurology.  He is thought to have peripheral neuropathy.    ECG: NSR at 52, left axis deviation.   Labs (3/14): LDL 82, HDL 68, K 5.2, creatinine 5.62 Labs (5/14): TSH normal  PMH: 1. HTN 2. Hyperlipidemia 3. BPH 4. H/o sigmoid diverticulitis 5. CAD: s/p CABG x 5 in 2001.   6. Aortic stenosis: Echo (3/14) with EF 55%, AVA 1.5 cm^2 with mean gradient 14 mmHg (mild range).  7. Imbalance: ?polyneuropathy.   SH: Married, prior smoker, lives in [place], retired  FH: Father with CAD  ROS: All systems reviewed and negative except as per HPI.   Current Outpatient Prescriptions  Medication Sig Dispense Refill  . atenolol (TENORMIN) 25 MG tablet Take 1 tablet (25 mg total) by mouth 2 (two) times daily.  180 tablet  1  . cholecalciferol (VITAMIN D) 1000 UNITS tablet Take 1,000 Units by mouth as directed.       . finasteride (PROSCAR) 5 MG tablet Take 5 mg by mouth daily.        [name] lisinopril-hydrochlorothiazide (ZESTORETIC) 20-12.5 MG per tablet Take 1 tablet by mouth 2 (two) times daily.  60 tablet  0  . lovastatin (MEVACOR) 20 MG tablet Take 1 tablet (20 mg total) by mouth at bedtime.  90 tablet  3  . MELATONIN PO Take by mouth at bedtime as needed.      . Multiple Vitamin (MULTIVITAMIN) capsule Take 1 capsule by mouth every other day.       . multivitamin-lutein (OCUVITE-LUTEIN)  CAPS Take 1 capsule by mouth as directed.       [name] aspirin EC 81 MG tablet Take 1 tablet (81 mg total) by mouth daily.       No current facility-administered medications for this visit.    BP 134/82  Pulse 52  Ht 5\' 5"  (1.651 m)  Wt 151 lb (68.493 kg)  BMI 25.13 kg/m2 General: NAD Neck: No JVD, no thyromegaly or thyroid nodule.  Lungs: Clear to auscultation bilaterally with normal respiratory effort. CV: Nondisplaced PMI.  Heart regular S1/S2, no S3/S4, no murmur.  No peripheral edema.  No carotid bruit.  Feet are warm but I am unable to palpate PT pulses.  Abdomen: Soft, nontender, no hepatosplenomegaly, no distention.  Skin: Intact without lesions or rashes.  Neurologic: Alert and oriented x 3.  Psych: Normal affect. Extremities: No clubbing or cyanosis.   Assessment/Plan: 1. CAD: s/p CABG.  No ischemic symptoms.  Continue ACEI, beta blocker, and statin.  He needs to start ASA 81 mg daily.   2. Aortic stenosis: Mild by last echo.  Would repeat echo in 2 yrs (3/16) unless symptoms change.  3. Leg weakness: I cannot feel his PT pulses.  Will get ABIs to  assess for PAD.  4. Hyperlipidemia: Lipids reasonable in 3/14.  5. HTN: BP controlled.  As K was upper normal when last checked, will repeat BMET.   Followup in 6 months.   Marca Ancona 05/28/2013 11:04 AM

## 2013-05-28 NOTE — Patient Instructions (Signed)
Your physician recommends that you have  lab work today--BMET.  Take aspirin 81mg  daily.   Your physician has requested that you have a lower  extremity arterial duplex. This test is an ultrasound of the arteries in the legs. It looks at arterial blood flow in the legs . Allow one hour for Arterial scans. There are no restrictions or special instructions.  Your physician wants you to follow-up in: 6 months with Dr Shirlee Latch. (June 2015). You will receive a reminder letter in the mail two months in advance. If you don't receive a letter, please call our office to schedule the follow-up appointment.

## 2013-05-30 ENCOUNTER — Telehealth: Payer: Self-pay | Admitting: Cardiology

## 2013-05-30 NOTE — Telephone Encounter (Signed)
Spoke with patient.

## 2013-05-30 NOTE — Telephone Encounter (Signed)
New Problem:  Pt states he has some information to communicate to the nurse . Pt is requesting the nurse call him back.

## 2013-06-04 ENCOUNTER — Ambulatory Visit (HOSPITAL_COMMUNITY): Payer: Medicare Other | Attending: Cardiology

## 2013-06-04 DIAGNOSIS — I10 Essential (primary) hypertension: Secondary | ICD-10-CM | POA: Insufficient documentation

## 2013-06-04 DIAGNOSIS — R29898 Other symptoms and signs involving the musculoskeletal system: Secondary | ICD-10-CM | POA: Insufficient documentation

## 2013-06-04 DIAGNOSIS — I2510 Atherosclerotic heart disease of native coronary artery without angina pectoris: Secondary | ICD-10-CM | POA: Insufficient documentation

## 2013-06-04 DIAGNOSIS — I739 Peripheral vascular disease, unspecified: Secondary | ICD-10-CM | POA: Insufficient documentation

## 2013-06-04 DIAGNOSIS — I70219 Atherosclerosis of native arteries of extremities with intermittent claudication, unspecified extremity: Secondary | ICD-10-CM

## 2013-06-05 ENCOUNTER — Other Ambulatory Visit: Payer: Self-pay | Admitting: *Deleted

## 2013-06-05 MED ORDER — LOVASTATIN 20 MG PO TABS: 20.0000 mg | ORAL_TABLET | Freq: Every day | ORAL | Status: DC

## 2013-06-07 ENCOUNTER — Other Ambulatory Visit: Payer: Self-pay | Admitting: *Deleted

## 2013-06-07 DIAGNOSIS — I2510 Atherosclerotic heart disease of native coronary artery without angina pectoris: Secondary | ICD-10-CM

## 2013-06-07 DIAGNOSIS — R29898 Other symptoms and signs involving the musculoskeletal system: Secondary | ICD-10-CM

## 2013-06-12 ENCOUNTER — Encounter (HOSPITAL_COMMUNITY): Payer: Medicare Other

## 2013-06-18 ENCOUNTER — Other Ambulatory Visit: Payer: Self-pay | Admitting: *Deleted

## 2013-06-18 ENCOUNTER — Ambulatory Visit (HOSPITAL_COMMUNITY): Payer: Medicare Other | Attending: Cardiology

## 2013-06-18 ENCOUNTER — Other Ambulatory Visit (INDEPENDENT_AMBULATORY_CARE_PROVIDER_SITE_OTHER): Payer: Medicare Other

## 2013-06-18 DIAGNOSIS — I359 Nonrheumatic aortic valve disorder, unspecified: Secondary | ICD-10-CM

## 2013-06-18 DIAGNOSIS — R29898 Other symptoms and signs involving the musculoskeletal system: Secondary | ICD-10-CM

## 2013-06-18 DIAGNOSIS — I2510 Atherosclerotic heart disease of native coronary artery without angina pectoris: Secondary | ICD-10-CM

## 2013-06-18 DIAGNOSIS — I10 Essential (primary) hypertension: Secondary | ICD-10-CM

## 2013-06-18 DIAGNOSIS — Z87891 Personal history of nicotine dependence: Secondary | ICD-10-CM | POA: Insufficient documentation

## 2013-06-18 DIAGNOSIS — I700 Atherosclerosis of aorta: Secondary | ICD-10-CM

## 2013-06-18 DIAGNOSIS — E785 Hyperlipidemia, unspecified: Secondary | ICD-10-CM | POA: Insufficient documentation

## 2013-06-18 LAB — BASIC METABOLIC PANEL
CO2: 29 mEq/L (ref 19–32)
Calcium: 9.1 mg/dL (ref 8.4–10.5)
Chloride: 92 mEq/L — ABNORMAL LOW (ref 96–112)
Potassium: 3.9 mEq/L (ref 3.5–5.1)
Sodium: 130 mEq/L — ABNORMAL LOW (ref 135–145)

## 2013-06-19 ENCOUNTER — Telehealth: Payer: Self-pay | Admitting: Cardiology

## 2013-06-19 DIAGNOSIS — E871 Hypo-osmolality and hyponatremia: Secondary | ICD-10-CM

## 2013-06-19 NOTE — Telephone Encounter (Signed)
Advised patient of lab results, will not be available the entire month of January. Will aim for 08/03/13 and would like to have done at the [facility] location. Order in system  Notes Recorded by Laurey Morale, MD on 06/18/2013 at 10:40 PM Low sodium. Would cut back some on fluid intake. Repeat BMET 1 month

## 2013-06-19 NOTE — Telephone Encounter (Signed)
New Problem: ° °Pt states he is returning a call to the nurse. Pt is requesting a call back.  °

## 2013-07-09 ENCOUNTER — Telehealth: Payer: Self-pay | Admitting: Family Medicine

## 2013-07-09 DIAGNOSIS — I10 Essential (primary) hypertension: Secondary | ICD-10-CM

## 2013-07-09 NOTE — Telephone Encounter (Signed)
Patient states that he usually gets his medication through a mail order pharmacy but will be going out of town on Friday. He would like to know if Dr. Charlett Blake would write him a 30 day supply of atenolol? [facility] on [address].

## 2013-07-10 MED ORDER — ATENOLOL 25 MG PO TABS: 25.0000 mg | ORAL_TABLET | Freq: Two times a day (BID) | ORAL | Status: DC

## 2013-08-01 ENCOUNTER — Telehealth: Payer: Self-pay | Admitting: Cardiology

## 2013-08-01 ENCOUNTER — Telehealth: Payer: Self-pay

## 2013-08-01 DIAGNOSIS — E871 Hypo-osmolality and hyponatremia: Secondary | ICD-10-CM

## 2013-08-01 NOTE — Telephone Encounter (Signed)
BMET is needed by DR Aundra Dubin order put in for [facility] for 08-07-13

## 2013-08-01 NOTE — Telephone Encounter (Signed)
New problem   Pt want to know if he can have labs drawn at [facility] on [address]. Please call pt to verify correct lab infor.

## 2013-08-01 NOTE — Telephone Encounter (Signed)
Spoke with pt and left message at Dr Frederik Pear office pt needs BMET 08/07/13, order in [place].

## 2013-08-07 ENCOUNTER — Telehealth: Payer: Self-pay | Admitting: Family Medicine

## 2013-08-07 ENCOUNTER — Other Ambulatory Visit: Payer: Medicare Other

## 2013-08-07 LAB — BASIC METABOLIC PANEL
BUN: 21 mg/dL (ref 6–23)
CALCIUM: 8.9 mg/dL (ref 8.4–10.5)
CO2: 28 meq/L (ref 19–32)
CREATININE: 0.98 mg/dL (ref 0.50–1.35)
Chloride: 92 mEq/L — ABNORMAL LOW (ref 96–112)
Glucose, Bld: 100 mg/dL — ABNORMAL HIGH (ref 70–99)
Potassium: 4.6 mEq/L (ref 3.5–5.3)
Sodium: 129 mEq/L — ABNORMAL LOW (ref 135–145)

## 2013-08-07 MED ORDER — FINASTERIDE 5 MG PO TABS: 5.0000 mg | ORAL_TABLET | Freq: Every day | ORAL | Status: DC

## 2013-08-07 NOTE — Telephone Encounter (Signed)
Pt is due for follow up in the office. Refill sent on finasteride.

## 2013-08-07 NOTE — Telephone Encounter (Signed)
Spoke with Chrissie Noa at [facility]. He states that he does not see any requests pending. Notified pt that he should contact [facility] directly for further refills. Finasteride rx that was sent to [facility] was cancelled. Rx resent to [facility].

## 2013-08-07 NOTE — Telephone Encounter (Signed)
Looks like lovastatin refill was sent to [facility] on 06/05/13, #90 x 1 refill by cardiology. I do not see that we have prescribed finasteride before.  Please advise.

## 2013-08-07 NOTE — Telephone Encounter (Signed)
Finasteride tab 5 mg take q by mouth daily  Lovastatin tab 20 mg take 1 by mouth at bedtime  Qty 90

## 2013-08-13 ENCOUNTER — Telehealth: Payer: Self-pay | Admitting: Cardiology

## 2013-08-13 NOTE — Telephone Encounter (Signed)
Pt calling about recent lab results. Discussed results with patient.

## 2013-08-13 NOTE — Telephone Encounter (Signed)
New problem ° ° °Pt returning a call.  °

## 2013-08-20 ENCOUNTER — Other Ambulatory Visit: Payer: Self-pay

## 2013-08-20 DIAGNOSIS — I10 Essential (primary) hypertension: Secondary | ICD-10-CM

## 2013-08-20 MED ORDER — LISINOPRIL-HYDROCHLOROTHIAZIDE 20-12.5 MG PO TABS: 1.0000 | ORAL_TABLET | Freq: Two times a day (BID) | ORAL | Status: DC

## 2013-08-20 MED ORDER — ATENOLOL 25 MG PO TABS: 25.0000 mg | ORAL_TABLET | Freq: Two times a day (BID) | ORAL | Status: DC

## 2013-08-28 ENCOUNTER — Telehealth: Payer: Self-pay | Admitting: Family Medicine

## 2013-08-28 DIAGNOSIS — I10 Essential (primary) hypertension: Secondary | ICD-10-CM

## 2013-08-28 MED ORDER — ATENOLOL 25 MG PO TABS: 25.0000 mg | ORAL_TABLET | Freq: Two times a day (BID) | ORAL | Status: DC

## 2013-08-28 NOTE — Telephone Encounter (Signed)
He is out of atenolol 25 mg  Please send in today.

## 2013-08-29 ENCOUNTER — Telehealth: Payer: Self-pay | Admitting: Family Medicine

## 2013-08-29 NOTE — Telephone Encounter (Signed)
Relevant patient education assigned to patient using Emmi. ° °

## 2013-12-13 ENCOUNTER — Telehealth: Payer: Self-pay | Admitting: Cardiology

## 2013-12-13 NOTE — Telephone Encounter (Signed)
New Message:  Pt is calling to be worked in to see Dr. Aundra Dubin. Pt was incorrectly scheduled with Dr. Angelena Form on 12/20/13.... However, Dr. Angelena Form is not his doctor... Pt would liked to be worked in to see Dr. Aundra Dubin since the appt mix up was not his fault and Dr. Aundra Dubin is now booked for several months. Pt has had his appt since Feb.. Unfortunately, it is with the wrong doctor. Pt is requesting to speak with Webb Silversmith. I am canceling his appt w/ Dr. Angelena Form.

## 2013-12-13 NOTE — Telephone Encounter (Signed)
Spoke with this patient to let him know we will be scheduling him an appt. with Dr. Aundra Dubin asap. Pt. verbalized understanding and he wants a return call on Monday (22nd) due to leaving town tomorrow for 3 days. GL

## 2013-12-17 ENCOUNTER — Ambulatory Visit: Payer: Medicare Other | Admitting: Cardiovascular Disease

## 2013-12-20 ENCOUNTER — Ambulatory Visit: Payer: Medicare Other | Admitting: Cardiovascular Disease

## 2013-12-24 NOTE — Telephone Encounter (Signed)
Follow up scheduled

## 2014-02-13 ENCOUNTER — Ambulatory Visit (INDEPENDENT_AMBULATORY_CARE_PROVIDER_SITE_OTHER): Payer: Medicare Other | Admitting: Cardiology

## 2014-02-13 ENCOUNTER — Encounter: Payer: Self-pay | Admitting: *Deleted

## 2014-02-13 ENCOUNTER — Encounter: Payer: Self-pay | Admitting: Cardiology

## 2014-02-13 VITALS — BP 130/80 | HR 53 | Ht 65.0 in | Wt 150.0 lb

## 2014-02-13 DIAGNOSIS — I359 Nonrheumatic aortic valve disorder, unspecified: Secondary | ICD-10-CM

## 2014-02-13 DIAGNOSIS — E785 Hyperlipidemia, unspecified: Secondary | ICD-10-CM

## 2014-02-13 DIAGNOSIS — I10 Essential (primary) hypertension: Secondary | ICD-10-CM

## 2014-02-13 DIAGNOSIS — I350 Nonrheumatic aortic (valve) stenosis: Secondary | ICD-10-CM

## 2014-02-13 DIAGNOSIS — I2510 Atherosclerotic heart disease of native coronary artery without angina pectoris: Secondary | ICD-10-CM

## 2014-02-13 LAB — BASIC METABOLIC PANEL
BUN: 13 mg/dL (ref 6–23)
CO2: 30 mEq/L (ref 19–32)
Calcium: 9 mg/dL (ref 8.4–10.5)
Chloride: 91 mEq/L — ABNORMAL LOW (ref 96–112)
Creatinine, Ser: 0.9 mg/dL (ref 0.4–1.5)
GFR: 86.66 mL/min (ref >60.00)
Glucose, Bld: 102 mg/dL — ABNORMAL HIGH (ref 70–99)
Potassium: 4.4 mEq/L (ref 3.5–5.1)
Sodium: 126 mEq/L — ABNORMAL LOW (ref 135–145)

## 2014-02-13 LAB — LIPID PANEL
Cholesterol: 167 mg/dL (ref 0–200)
HDL: 80.6 mg/dL (ref >39.00)
LDL Cholesterol: 73 mg/dL (ref 0–99)
NonHDL: 86.4
TRIGLYCERIDES: 65 mg/dL (ref 0.0–149.0)
Total CHOL/HDL Ratio: 2
VLDL: 13 mg/dL (ref 0.0–40.0)

## 2014-02-13 NOTE — Patient Instructions (Signed)
Your physician recommends that you return for lab work today-lipid profile/BMET  Your physician has requested that you have an echocardiogram. Echocardiography is a painless test that uses sound waves to create images of your heart. It provides your doctor with information about the size and shape of your heart and how well your heart's chambers and valves are working. This procedure takes approximately one hour. There are no restrictions for this procedure. MARCH 2016  Your physician recommends that you schedule a follow-up appointment in: March 2016 with Dr Aundra Dubin.

## 2014-02-14 NOTE — Progress Notes (Signed)
Patient ID: Justin [name], male   DOB: [birth year], 80 y.o.   MRN: [id number] PCP: Dr. Charlett Blake  80 yo with history of CAD s/p CABG presents for cardiology followup.  In general, he has been doing well.  He is retired.  He walks for about a mile 3-4 times a week and works out with weights 2-3 times a week.  He plays golf.  No exertional dyspnea or chest pain.  No tachypalpitations.  Main complaint continues to be that his legs will tire quickly with exertion but there is no pain.  After last appointment, he had peripheral arterial dopplers that showed nonobstructive PAD.  He has balance difficulty and had evaluation by neurology.  He is thought to have peripheral neuropathy.  He tends to be mildly bradycardic but denies lightheadedness.   ECG: NSR at 53.   Labs (3/14): LDL 82, HDL 68, K 5.2, creatinine 1.06 Labs (5/14): TSH normal  PMH: 1. HTN 2. Hyperlipidemia 3. BPH 4. H/o sigmoid diverticulitis 5. CAD: s/p CABG x 5 in 2001.   6. Aortic stenosis: Echo (3/14) with EF 55%, AVA 1.5 cm^2 with mean gradient 14 mmHg (mild range).  7. Imbalance: ?polyneuropathy.  8. PAD: Peripheral arterial dopplers and aorto-iliac exam in 12/14 showed < 50% diffuse bilateral SFA disease and calcified plaque in the iliac system but no significant stenosis.   SH: Married, prior smoker, lives in [place], retired  FH: Father with CAD  ROS: All systems reviewed and negative except as per HPI.   Current Outpatient Prescriptions  Medication Sig Dispense Refill  . aspirin EC 81 MG tablet Take 1 tablet (81 mg total) by mouth daily.      [name] atenolol (TENORMIN) 25 MG tablet Take 1 tablet (25 mg total) by mouth 2 (two) times daily.  180 tablet  0  . cholecalciferol (VITAMIN D) 1000 UNITS tablet Take 1,000 Units by mouth as directed.       . finasteride (PROSCAR) 5 MG tablet Take 1 tablet (5 mg total) by mouth daily.  90 tablet  1  . lisinopril-hydrochlorothiazide (ZESTORETIC) 20-12.5 MG per tablet Take 1 tablet by mouth 2  (two) times daily.  180 tablet  3  . lovastatin (MEVACOR) 20 MG tablet Take 1 tablet (20 mg total) by mouth at bedtime.  90 tablet  2  . MELATONIN PO Take 1 tablet by mouth at bedtime as needed.       . Multiple Vitamin (MULTIVITAMIN) capsule Take 1 capsule by mouth every other day.       . multivitamin-lutein (OCUVITE-LUTEIN) CAPS Take 2 capsules by mouth as directed.        No current facility-administered medications for this visit.    BP 130/80  Pulse 53  Ht 5\' 5"  (1.651 m)  Wt 68.04 kg (150 lb)  BMI 24.96 kg/m2 General: NAD Neck: No JVD, no thyromegaly or thyroid nodule.  Lungs: Clear to auscultation bilaterally with normal respiratory effort. CV: Nondisplaced PMI.  Heart regular S1/S2, no S3/S4, 1/6 SEM RUSB.  No peripheral edema.  No carotid bruit.  Feet are warm but I am unable to palpate PT pulses.  Abdomen: Soft, nontender, no hepatosplenomegaly, no distention.  Skin: Intact without lesions or rashes.  Neurologic: Alert and oriented x 3.  Psych: Normal affect. Extremities: No clubbing or cyanosis.   Assessment/Plan: 1. CAD: s/p CABG.  No ischemic symptoms.  Continue ASA 81, ACEI, beta blocker, and statin.   2. Aortic stenosis: Mild by last echo in 3/14.  Would repeat echo at 2 yrs (3/16) unless symptoms change.  3. Leg weakness: Patient had PAD by doppler exam in 12/14 but appeared nonobstructive.  He does not have claudication.  I asked him to try to increase the distance on his walks to try to improve stamina. 4. Hyperlipidemia: Check lipids today, goal LDL < 70.   5. HTN: BP controlled.  BMET today.    Loralie Champagne 02/14/2014

## 2014-02-15 ENCOUNTER — Other Ambulatory Visit: Payer: Self-pay | Admitting: *Deleted

## 2014-02-15 DIAGNOSIS — I10 Essential (primary) hypertension: Secondary | ICD-10-CM

## 2014-02-15 DIAGNOSIS — I2510 Atherosclerotic heart disease of native coronary artery without angina pectoris: Secondary | ICD-10-CM

## 2014-02-15 MED ORDER — LISINOPRIL 40 MG PO TABS: 40.0000 mg | ORAL_TABLET | Freq: Every day | ORAL | Status: DC

## 2014-03-06 ENCOUNTER — Telehealth: Payer: Self-pay | Admitting: *Deleted

## 2014-03-06 ENCOUNTER — Other Ambulatory Visit (INDEPENDENT_AMBULATORY_CARE_PROVIDER_SITE_OTHER): Payer: Medicare Other

## 2014-03-06 DIAGNOSIS — I2510 Atherosclerotic heart disease of native coronary artery without angina pectoris: Secondary | ICD-10-CM

## 2014-03-06 DIAGNOSIS — I10 Essential (primary) hypertension: Secondary | ICD-10-CM

## 2014-03-06 LAB — BASIC METABOLIC PANEL
BUN: 18 mg/dL (ref 6–23)
CALCIUM: 8.8 mg/dL (ref 8.4–10.5)
CO2: 28 meq/L (ref 19–32)
Chloride: 97 mEq/L (ref 96–112)
Creatinine, Ser: 1 mg/dL (ref 0.4–1.5)
GFR: 75.75 mL/min (ref >60.00)
Glucose, Bld: 89 mg/dL (ref 70–99)
Potassium: 4.3 mEq/L (ref 3.5–5.1)
Sodium: 132 mEq/L — ABNORMAL LOW (ref 135–145)

## 2014-03-06 NOTE — Telephone Encounter (Signed)
Pt to office for lab today. Pt states he has been taking 2 of lisinopril 40mg  daily instead of 1 daily as Dr Aundra Dubin ordered 02/15/14. BMET done today and reviewed by Dr Aundra Dubin.  Per Dr Orpah Greek should take lisinopril 40mg  daily and monitor BP.   Pt advised, verbalized understanding.

## 2014-03-07 DIAGNOSIS — L739 Follicular disorder, unspecified: Secondary | ICD-10-CM | POA: Insufficient documentation

## 2014-03-21 ENCOUNTER — Encounter: Payer: Self-pay | Admitting: Medical

## 2014-03-21 ENCOUNTER — Ambulatory Visit (INDEPENDENT_AMBULATORY_CARE_PROVIDER_SITE_OTHER): Payer: Medicare Other | Admitting: Medical

## 2014-03-21 VITALS — BP 172/80 | HR 67 | Temp 97.9°F | Ht 65.3 in | Wt 153.0 lb

## 2014-03-21 DIAGNOSIS — L0390 Cellulitis, unspecified: Secondary | ICD-10-CM | POA: Insufficient documentation

## 2014-03-21 DIAGNOSIS — H9319 Tinnitus, unspecified ear: Secondary | ICD-10-CM | POA: Insufficient documentation

## 2014-03-21 DIAGNOSIS — H9312 Tinnitus, left ear: Secondary | ICD-10-CM

## 2014-03-21 DIAGNOSIS — IMO0002 Reserved for concepts with insufficient information to code with codable children: Secondary | ICD-10-CM

## 2014-03-21 DIAGNOSIS — L03113 Cellulitis of right upper limb: Secondary | ICD-10-CM

## 2014-03-21 MED ORDER — SULFAMETHOXAZOLE-TMP DS 800-160 MG PO TABS: 1.0000 | ORAL_TABLET | Freq: Two times a day (BID) | ORAL | Status: DC

## 2014-03-21 NOTE — Assessment & Plan Note (Signed)
Will refer to ENT

## 2014-03-21 NOTE — Progress Notes (Signed)
   Subjective:    Patient ID: Justin [name], male    DOB: 1928/11/30, 80 y.o.   MRN: [id number]  HPI  Pt in with rt wrist red raised area.x 3 wks. This area is mild sore at most. Pt not really sure what is the cause. He thinks he may have been bit by a spider. No numbness or tingling. Pt has applied neosporin. Pt was seen about 10 days ago. He was given cephalexin by provider not associated to [facility]. He was traveling and was  Antibiotic very sporadiclly. He states the NP that gave the antibiotic did do slight I and D. They did get some yellow discharge that was minimal.  Pt also has some hx of balance problems. Pt went to neurologist and no cause found. He does have some tinnitus lt ear for years. No vertigo just feels off balance. Pt did PT therapy for some time and goes to balance classes. He states not much improvements. He reports no fall. He does want opinion of ENT regarding the ringing of his left ear. He has no ha.    Review of Systems  Constitutional: Negative for fever, chills and fatigue.  HENT: Negative.        Lt ear tinnitus.  Respiratory: Negative for apnea, cough, shortness of breath and wheezing.   Cardiovascular: Negative for chest pain and palpitations.  Gastrointestinal: Negative.   Musculoskeletal: Negative.   Skin:       Rt wrist/forearm mild red, faint swollen area.  Neurological: Negative for tremors, seizures, syncope, facial asymmetry, speech difficulty, weakness, numbness and headaches.       Chronic slight off balance for years.  Hematological: Negative for adenopathy. Does not bruise/bleed easily.  Psychiatric/Behavioral: Negative.        Objective:   Physical Exam   General- No acute distress, pleasant. Lungs-cta. Heart- RRR Neurologic- CN III-XII intact, neg romberg. 5/5 equal and symmetric strength bilaterally. Ears- canals rt side mild wax. Lt side no wax. Skin- 10 mm reddish, purplish. Distal radius area. Mild tender, and faint warm. Squeezed  area and no discharge.            Assessment & Plan:

## 2014-03-21 NOTE — Assessment & Plan Note (Signed)
Distal rt radius area. Small faint reddish discolored area. Faint warmth and faint tender. Spider bite possible with secondary infection. Rx bactrim. Follow up in 7 days or as needed. If area is not well healed will refer to dermatologist.(to evaluate area.)

## 2014-03-21 NOTE — Patient Instructions (Signed)
Your appear to have possible spider bite with secondary infection partially treated by cephalexin. I want to try stronger antibiotic bactrim ds. If area does not improve by 7 days then will refer you to dermatologist.  For your tinnitus, I will refer you to ENT.    Tinnitus Sounds you hear in your ears and coming from within the ear is called tinnitus. This can be a symptom of many ear disorders. It is often associated with hearing loss.  Tinnitus can be seen with:  Infections.  Ear blockages such as wax buildup.  Meniere's disease.  Ear damage.  Inherited.  Occupational causes. While irritating, it is not usually a threat to health. When the cause of the tinnitus is wax, infection in the middle ear, or foreign body it is easily treated. Hearing loss will usually be reversible.  TREATMENT  When treating the underlying cause does not get rid of tinnitus, it may be necessary to get rid of the unwanted sound by covering it up with more pleasant background noises. This may include music, the radio etc. There are tinnitus maskers which can be worn which produce background noise to cover up the tinnitus. Avoid all medications which tend to make tinnitus worse such as alcohol, caffeine, aspirin, and nicotine. There are many soothing background tapes such as rain, ocean, thunderstorms, etc. These soothing sounds help with sleeping or resting. Keep all follow-up appointments and referrals. This is important to identify the cause of the problem. It also helps avoid complications, impaired hearing, disability, or chronic pain. Document Released: 06/14/2005 Document Revised: 09/06/2011 Document Reviewed: 01/31/2008 [facility] Patient Information 2015 [place], [place]. This information is not intended to replace advice given to you by your health care provider. Make sure you discuss any questions you have with your health care provider.

## 2014-03-22 ENCOUNTER — Ambulatory Visit: Payer: Medicare Other | Admitting: Physician Assistant

## 2014-03-27 ENCOUNTER — Telehealth: Payer: Self-pay | Admitting: Medical

## 2014-03-27 NOTE — Telephone Encounter (Signed)
Called patient,advised that per ES he should be fine to take Flu shot and to check with pharmacist at the pharmacy when he goes to have shot given.

## 2014-03-27 NOTE — Telephone Encounter (Signed)
Caller name: Karam  Relation to pt: self Call back number: [phone]   Reason for call:   pt would like to get the flu shot done at he's nearest pharmacy and would like to know if sulfamethoxazole-trimethoprim (BACTRIM DS) 800-160 MG per tablet will interfere. Patient has 2 pills left. Please advise

## 2014-04-03 ENCOUNTER — Other Ambulatory Visit: Payer: Self-pay

## 2014-04-03 MED ORDER — LOVASTATIN 20 MG PO TABS
20.0000 mg | ORAL_TABLET | Freq: Every day | ORAL | Status: DC
Start: 2014-04-03 — End: 2014-09-30

## 2014-04-04 ENCOUNTER — Ambulatory Visit: Payer: Medicare Other | Admitting: Medical

## 2014-04-05 ENCOUNTER — Encounter: Payer: Self-pay | Admitting: Medical

## 2014-04-05 ENCOUNTER — Ambulatory Visit (INDEPENDENT_AMBULATORY_CARE_PROVIDER_SITE_OTHER): Payer: Medicare Other | Admitting: Medical

## 2014-04-05 VITALS — BP 164/82 | HR 60 | Temp 97.8°F | Ht 65.5 in | Wt 151.6 lb

## 2014-04-05 DIAGNOSIS — L989 Disorder of the skin and subcutaneous tissue, unspecified: Secondary | ICD-10-CM

## 2014-04-05 MED ORDER — MUPIROCIN 2 % EX OINT: 1.0000 "application " | TOPICAL_OINTMENT | Freq: Three times a day (TID) | CUTANEOUS | Status: DC

## 2014-04-05 MED ORDER — MUPIROCIN 2 % EX OINT: 1.0000 "application " | TOPICAL_OINTMENT | Freq: Two times a day (BID) | CUTANEOUS | Status: DC

## 2014-04-05 NOTE — Progress Notes (Signed)
Subjective:    Patient ID: Justin [name], male    DOB: 1929/01/22, 80 y.o.   MRN: [id number]  HPI  Pt has continued area rt ventral wrist that has not healed. Did not respond to treatment of antibiotics. Failed 2 antibiotics. No fever. No chills. No diaphoresis.  Past Medical History  Diagnosis Date  . CAD (coronary artery disease)     s/p CABG  . HTN (hypertension)   . Hyperlipidemia   . BPH (benign prostatic hypertrophy)   . Sigmoid diverticulitis   . Internal hemorrhoids   . Basal cell carcinoma   . Disequilibrium 04/03/2012    History   Social History  . Marital Status: Married    Spouse Name: N/A    Number of Children: 2  . Years of Education: N/A   Occupational History  . 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 Smoking status: Former Smoker -- 0.50 packs/day for 15 years    Quit date: 06/29/1971  . Smokeless tobacco: Never Used  . Alcohol Use: Yes     Comment: 1-2 glasses of wine  . Drug Use: No  . Sexual Activity: Not on file   Other Topics Concern  . Not on file   Social History Narrative  . No narrative on file    Past Surgical History  Procedure Laterality Date  . Coronary artery bypass graft    . Colonoscopy    . Shoulder surgery      x2    Family History  Problem Relation Age of Onset  . Coronary artery disease Father   . Hyperlipidemia Father   . Bipolar disorder Mother   . Alcohol abuse    . Heart attack Father     No Known Allergies  Current Outpatient Prescriptions on File Prior to Visit  Medication Sig Dispense Refill  . aspirin EC 81 MG tablet Take 1 tablet (81 mg total) by mouth daily.      [name] atenolol (TENORMIN) 25 MG tablet Take 1 tablet (25 mg total) by mouth 2 (two) times daily.  180 tablet  0  . cholecalciferol (VITAMIN D) 1000 UNITS tablet Take 1,000 Units by mouth as directed.       . finasteride (PROSCAR) 5 MG tablet Take 1 tablet (5 mg total) by mouth daily.  90 tablet  1  .  lisinopril (PRINIVIL,ZESTRIL) 40 MG tablet Take 1 tablet (40 mg total) by mouth daily.  30 tablet  3  . lovastatin (MEVACOR) 20 MG tablet Take 1 tablet (20 mg total) by mouth at bedtime.  90 tablet  1  . MELATONIN PO Take 1 tablet by mouth at bedtime as needed.       . Multiple Vitamin (MULTIVITAMIN) capsule Take 1 capsule by mouth every other day.       . multivitamin-lutein (OCUVITE-LUTEIN) CAPS Take 2 capsules by mouth as directed.       . cephALEXin (KEFLEX) 500 MG capsule Take 500 mg by mouth 3 (three) times daily.      [name] sulfamethoxazole-trimethoprim (BACTRIM DS) 800-160 MG per tablet Take 1 tablet by mouth 2 (two) times daily.  14 tablet  0   No current facility-administered medications on file prior to visit.    BP 164/82  Pulse 60  Temp(Src) 97.8 F (36.6 C) (Oral)  Ht 5' 5.5" (1.664 m)  Wt 151 lb 9.6 oz (68.765 kg)  BMI 24.83 kg/m2  SpO2 99%       Review  of Systems  Constitutional: Negative for fever, chills and fatigue.  Respiratory: Negative for cough, choking, chest tightness and shortness of breath.   Cardiovascular: Negative for chest pain and palpitations.  Skin:       Rt wrist. Non healing wound.   Neurological: Negative for dizziness, seizures, syncope, facial asymmetry, weakness, light-headedness, numbness and headaches.  Hematological: Negative for adenopathy. Does not bruise/bleed easily.       Objective:   Physical Exam General- NAD. Lungs- CTA Heart-RRR Skin- Rt wrist dorsal apect  9 mm reddish, purple area. Small break of epidermis center. No warmth, no dc.        Assessment & Plan:

## 2014-04-05 NOTE — Assessment & Plan Note (Signed)
Rt wrist. Not appearing classic for infection now. Some better with antibiotic oral. Rx mupirocin. Refer to dermatology. If area worsens prior to derm asked him to return here.

## 2014-04-05 NOTE — Progress Notes (Signed)
Pre visit review using our clinic review tool, if applicable. No additional management support is needed unless otherwise documented below in the visit note. 

## 2014-04-05 NOTE — Addendum Note (Signed)
Addended by: Ewing Schlein on: 04/05/2014 03:06 PM   Modules accepted: Orders

## 2014-04-05 NOTE — Patient Instructions (Addendum)
Will refer you to your prior dermatologist for non healing wound. It doe look better but not classic features for infection. So I do want dermatologist to evaluate.  Will refill his mupirocin today.  If your wrist area worsen before dermatologist visit notify us.

## 2014-04-05 NOTE — Addendum Note (Signed)
Addended by: Ewing Schlein on: 04/05/2014 02:52 PM   Modules accepted: Orders

## 2014-04-17 ENCOUNTER — Other Ambulatory Visit: Payer: Self-pay

## 2014-04-17 ENCOUNTER — Encounter: Payer: Self-pay | Admitting: Medical

## 2014-04-17 ENCOUNTER — Telehealth: Payer: Self-pay | Admitting: Medical

## 2014-04-17 MED ORDER — MUPIROCIN 2 % EX OINT: 1.0000 "application " | TOPICAL_OINTMENT | Freq: Two times a day (BID) | CUTANEOUS | Status: DC

## 2014-04-17 NOTE — Telephone Encounter (Signed)
Sent medication to [facility] per patien request.

## 2014-04-17 NOTE — Telephone Encounter (Signed)
Will clarify recent mupirocin rx. Sent to mail order?? By accident apparently. Will send message to LPN for her to send to local pharmacy. Notify pt and cancel mail orders.

## 2014-09-09 ENCOUNTER — Ambulatory Visit (HOSPITAL_COMMUNITY): Payer: PPO | Attending: Cardiology | Admitting: Cardiology

## 2014-09-09 DIAGNOSIS — I350 Nonrheumatic aortic (valve) stenosis: Secondary | ICD-10-CM | POA: Insufficient documentation

## 2014-09-09 NOTE — Progress Notes (Signed)
Echo performed. 

## 2014-09-11 ENCOUNTER — Telehealth: Payer: Self-pay | Admitting: Cardiology

## 2014-09-11 NOTE — Telephone Encounter (Signed)
Returning call for echo results.  Advised of results.  Will keep appointment with Dr. Aundra Dubin 3/31.

## 2014-09-11 NOTE — Telephone Encounter (Signed)
New Msg         Pt returning call, states he is calling about results of echo.    Please call back.

## 2014-09-13 ENCOUNTER — Emergency Department (HOSPITAL_COMMUNITY): Payer: PPO

## 2014-09-13 ENCOUNTER — Other Ambulatory Visit (HOSPITAL_COMMUNITY): Payer: Self-pay

## 2014-09-13 ENCOUNTER — Observation Stay (HOSPITAL_COMMUNITY)
Admission: EM | Admit: 2014-09-13 | Discharge: 2014-09-15 | Disposition: A | Discharge status: Home / Self Care | Payer: PPO | Attending: Cardiology | Admitting: Cardiology

## 2014-09-13 ENCOUNTER — Encounter (HOSPITAL_COMMUNITY): Payer: Self-pay | Admitting: Emergency Medicine

## 2014-09-13 DIAGNOSIS — I350 Nonrheumatic aortic (valve) stenosis: Secondary | ICD-10-CM | POA: Diagnosis not present

## 2014-09-13 DIAGNOSIS — S0990XA Unspecified injury of head, initial encounter: Secondary | ICD-10-CM | POA: Diagnosis not present

## 2014-09-13 DIAGNOSIS — Z79899 Other long term (current) drug therapy: Secondary | ICD-10-CM | POA: Diagnosis not present

## 2014-09-13 DIAGNOSIS — N400 Enlarged prostate without lower urinary tract symptoms: Secondary | ICD-10-CM | POA: Insufficient documentation

## 2014-09-13 DIAGNOSIS — R42 Dizziness and giddiness: Secondary | ICD-10-CM

## 2014-09-13 DIAGNOSIS — Z951 Presence of aortocoronary bypass graft: Secondary | ICD-10-CM | POA: Diagnosis not present

## 2014-09-13 DIAGNOSIS — E871 Hypo-osmolality and hyponatremia: Secondary | ICD-10-CM | POA: Insufficient documentation

## 2014-09-13 DIAGNOSIS — Z7982 Long term (current) use of aspirin: Secondary | ICD-10-CM | POA: Diagnosis not present

## 2014-09-13 DIAGNOSIS — Z87891 Personal history of nicotine dependence: Secondary | ICD-10-CM | POA: Diagnosis not present

## 2014-09-13 DIAGNOSIS — R2689 Other abnormalities of gait and mobility: Secondary | ICD-10-CM | POA: Insufficient documentation

## 2014-09-13 DIAGNOSIS — E785 Hyperlipidemia, unspecified: Secondary | ICD-10-CM | POA: Diagnosis present

## 2014-09-13 DIAGNOSIS — I480 Paroxysmal atrial fibrillation: Secondary | ICD-10-CM | POA: Diagnosis present

## 2014-09-13 DIAGNOSIS — I4891 Unspecified atrial fibrillation: Secondary | ICD-10-CM

## 2014-09-13 DIAGNOSIS — E860 Dehydration: Secondary | ICD-10-CM

## 2014-09-13 DIAGNOSIS — R2681 Unsteadiness on feet: Secondary | ICD-10-CM | POA: Diagnosis present

## 2014-09-13 DIAGNOSIS — I951 Orthostatic hypotension: Secondary | ICD-10-CM | POA: Diagnosis not present

## 2014-09-13 DIAGNOSIS — Z85828 Personal history of other malignant neoplasm of skin: Secondary | ICD-10-CM | POA: Diagnosis not present

## 2014-09-13 DIAGNOSIS — Z9861 Coronary angioplasty status: Secondary | ICD-10-CM

## 2014-09-13 DIAGNOSIS — R55 Syncope and collapse: Principal | ICD-10-CM | POA: Diagnosis present

## 2014-09-13 DIAGNOSIS — S40011A Contusion of right shoulder, initial encounter: Secondary | ICD-10-CM

## 2014-09-13 DIAGNOSIS — W19XXXA Unspecified fall, initial encounter: Secondary | ICD-10-CM | POA: Insufficient documentation

## 2014-09-13 DIAGNOSIS — I10 Essential (primary) hypertension: Secondary | ICD-10-CM | POA: Diagnosis not present

## 2014-09-13 DIAGNOSIS — I2510 Atherosclerotic heart disease of native coronary artery without angina pectoris: Secondary | ICD-10-CM | POA: Diagnosis not present

## 2014-09-13 DIAGNOSIS — R531 Weakness: Secondary | ICD-10-CM | POA: Diagnosis not present

## 2014-09-13 HISTORY — DX: Nonrheumatic aortic (valve) stenosis: I35.0

## 2014-09-13 HISTORY — DX: Syncope and collapse: R55

## 2014-09-13 HISTORY — DX: Paroxysmal atrial fibrillation: I48.0

## 2014-09-13 LAB — BASIC METABOLIC PANEL
Anion gap: 9 (ref 5–15)
BUN: 18 mg/dL (ref 6–23)
CO2: 24 mmol/L (ref 19–32)
Calcium: 8.9 mg/dL (ref 8.4–10.5)
Chloride: 96 mmol/L (ref 96–112)
Creatinine, Ser: 1.23 mg/dL (ref 0.50–1.35)
GFR calc Af Amer: 60 mL/min — ABNORMAL LOW (ref >90)
GFR calc non Af Amer: 52 mL/min — ABNORMAL LOW (ref >90)
GLUCOSE: 120 mg/dL — AB (ref 70–99)
POTASSIUM: 4.6 mmol/L (ref 3.5–5.1)
SODIUM: 129 mmol/L — AB (ref 135–145)

## 2014-09-13 LAB — URINALYSIS, ROUTINE W REFLEX MICROSCOPIC
Bilirubin Urine: NEGATIVE
Glucose, UA: NEGATIVE mg/dL
Hgb urine dipstick: NEGATIVE
KETONES UR: NEGATIVE mg/dL
Leukocytes, UA: NEGATIVE
NITRITE: NEGATIVE
PROTEIN: NEGATIVE mg/dL
Specific Gravity, Urine: 1.019 (ref 1.005–1.030)
UROBILINOGEN UA: 0.2 mg/dL (ref 0.0–1.0)
pH: 6.5 (ref 5.0–8.0)

## 2014-09-13 LAB — CBC
HEMATOCRIT: 37.4 % — AB (ref 39.0–52.0)
HEMOGLOBIN: 12.9 g/dL — AB (ref 13.0–17.0)
MCH: 28.5 pg (ref 26.0–34.0)
MCHC: 34.5 g/dL (ref 30.0–36.0)
MCV: 82.7 fL (ref 78.0–100.0)
Platelets: 265 10*3/uL (ref 150–400)
RBC: 4.52 MIL/uL (ref 4.22–5.81)
RDW: 14.6 % (ref 11.5–15.5)
WBC: 11.1 10*3/uL — AB (ref 4.0–10.5)

## 2014-09-13 LAB — I-STAT TROPONIN, ED: TROPONIN I, POC: 0.01 ng/mL (ref 0.00–0.08)

## 2014-09-13 MED ORDER — ATENOLOL 25 MG PO TABS
25.0000 mg | ORAL_TABLET | Freq: Two times a day (BID) | ORAL | Status: DC
  Filled 2014-09-13 (×2): qty 1

## 2014-09-13 MED ORDER — MELATONIN 3 MG PO TABS: 1.0000 | ORAL_TABLET | Freq: Every evening | ORAL | Status: DC | PRN

## 2014-09-13 MED ORDER — RIVAROXABAN 20 MG PO TABS
20.0000 mg | ORAL_TABLET | Freq: Every day | ORAL | Status: DC
  Administered 2014-09-13: 20 mg via ORAL
  Filled 2014-09-13 (×2): qty 1

## 2014-09-13 MED ORDER — RISAQUAD PO CAPS
1.0000 | ORAL_CAPSULE | Freq: Every day | ORAL | Status: DC
Start: 2014-09-13 — End: 2014-09-15
  Administered 2014-09-13 – 2014-09-15 (×3): 1 via ORAL
  Filled 2014-09-13 (×3): qty 1

## 2014-09-13 MED ORDER — MULTIVITAMINS PO CAPS: 1.0000 | ORAL_CAPSULE | ORAL | Status: DC

## 2014-09-13 MED ORDER — SODIUM CHLORIDE 0.9 % IV BOLUS (SEPSIS)
500.0000 mL | Freq: Once | INTRAVENOUS | Status: AC
  Administered 2014-09-13: 500 mL via INTRAVENOUS

## 2014-09-13 MED ORDER — ONDANSETRON HCL 4 MG/2ML IJ SOLN: 4.0000 mg | Freq: Four times a day (QID) | INTRAMUSCULAR | Status: DC | PRN

## 2014-09-13 MED ORDER — LISINOPRIL-HYDROCHLOROTHIAZIDE 20-12.5 MG PO TABS: 1.0000 | ORAL_TABLET | Freq: Two times a day (BID) | ORAL | Status: DC

## 2014-09-13 MED ORDER — VITAMIN D3 25 MCG (1000 UNIT) PO TABS
1000.0000 [IU] | ORAL_TABLET | Freq: Every day | ORAL | Status: DC
  Administered 2014-09-13 – 2014-09-15 (×3): 1000 [IU] via ORAL
  Filled 2014-09-13 (×3): qty 1

## 2014-09-13 MED ORDER — OCUVITE-LUTEIN PO CAPS
2.0000 | ORAL_CAPSULE | Freq: Every day | ORAL | Status: DC
Start: 2014-09-13 — End: 2014-09-15
  Administered 2014-09-13 – 2014-09-15 (×3): 2 via ORAL
  Filled 2014-09-13 (×3): qty 2

## 2014-09-13 MED ORDER — ATENOLOL 25 MG PO TABS
25.0000 mg | ORAL_TABLET | Freq: Two times a day (BID) | ORAL | Status: DC
  Administered 2014-09-14 – 2014-09-15 (×2): 25 mg via ORAL
  Filled 2014-09-13 (×5): qty 1

## 2014-09-13 MED ORDER — DIPHENHYDRAMINE-APAP (SLEEP) 25-500 MG PO TABS: 1.0000 | ORAL_TABLET | Freq: Every evening | ORAL | Status: DC | PRN

## 2014-09-13 MED ORDER — PROBIOTIC DAILY PO CAPS: ORAL_CAPSULE | Freq: Every day | ORAL | Status: DC

## 2014-09-13 MED ORDER — HYDROCHLOROTHIAZIDE 12.5 MG PO CAPS
12.5000 mg | ORAL_CAPSULE | Freq: Two times a day (BID) | ORAL | Status: DC
  Filled 2014-09-13 (×3): qty 1

## 2014-09-13 MED ORDER — LISINOPRIL 40 MG PO TABS
40.0000 mg | ORAL_TABLET | Freq: Every day | ORAL | Status: DC
  Filled 2014-09-13: qty 2

## 2014-09-13 MED ORDER — PRAVASTATIN SODIUM 20 MG PO TABS
20.0000 mg | ORAL_TABLET | Freq: Every day | ORAL | Status: DC
Start: 2014-09-13 — End: 2014-09-15
  Administered 2014-09-13 – 2014-09-14 (×2): 20 mg via ORAL
  Filled 2014-09-13 (×3): qty 1

## 2014-09-13 MED ORDER — LISINOPRIL 20 MG PO TABS
20.0000 mg | ORAL_TABLET | Freq: Two times a day (BID) | ORAL | Status: DC
  Filled 2014-09-13 (×3): qty 1

## 2014-09-13 MED ORDER — MUPIROCIN 2 % EX OINT
1.0000 "application " | TOPICAL_OINTMENT | Freq: Three times a day (TID) | CUTANEOUS | Status: DC
  Administered 2014-09-13 – 2014-09-14 (×2): 1 via NASAL
  Filled 2014-09-13: qty 22

## 2014-09-13 MED ORDER — ZOLPIDEM TARTRATE 5 MG PO TABS
5.0000 mg | ORAL_TABLET | Freq: Every evening | ORAL | Status: DC | PRN
  Administered 2014-09-13 – 2014-09-14 (×2): 5 mg via ORAL
  Filled 2014-09-13 (×2): qty 1

## 2014-09-13 MED ORDER — ACETAMINOPHEN 325 MG PO TABS
650.0000 mg | ORAL_TABLET | Freq: Once | ORAL | Status: AC
  Administered 2014-09-13: 650 mg via ORAL
  Filled 2014-09-13: qty 2

## 2014-09-13 MED ORDER — ACETAMINOPHEN 325 MG PO TABS
650.0000 mg | ORAL_TABLET | ORAL | Status: DC | PRN
  Administered 2014-09-14 (×2): 650 mg via ORAL
  Filled 2014-09-13 (×2): qty 2

## 2014-09-13 MED ORDER — SODIUM CHLORIDE 0.9 % IV BOLUS (SEPSIS): 500.0000 mL | Freq: Once | INTRAVENOUS | Status: AC

## 2014-09-13 MED ORDER — FINASTERIDE 5 MG PO TABS
5.0000 mg | ORAL_TABLET | Freq: Every day | ORAL | Status: DC
  Administered 2014-09-13 – 2014-09-15 (×3): 5 mg via ORAL
  Filled 2014-09-13 (×3): qty 1

## 2014-09-13 NOTE — Consult Note (Signed)
CARDIOLOGY ADMIT NOTE  Patient ID: Justin [name] MRN: [id number] DOB/AGE: 07/11/1928 81 y.o.  Admit date: 09/13/2014 Primary Physician [name], MD Primary Cardiologist Dr. Aundra Dubin Chief Complaint  Syncope, atrial fib.    HPI:  The patient presents after a syncopal episode.  He has a history of CAD and moderate AS.  He said that he has been signed up for a research study above vitamin D at [facility] and had been fasting. He had been feeling weak this morning.  He was sitting down and stood up and passed out. He did his head. He has some balance issues but typically hasn't had any presyncope or syncope. He's had some leg weakness. He has not noticed his heart to be irregular.  In the ED he was noted to be in atrial fib. This is new.   A CT of the head was non acute.  Sodium was low a 129.  He denies any other cardiovascular symptoms. He's been exercising regularly.   The patient denies any new symptoms such as chest discomfort, neck or arm discomfort. There has been no new shortness of breath, PND or orthopnea. There have been no reported palpitations, presyncope or syncope.   Past Medical History  Diagnosis Date  . CAD (coronary artery disease)     s/p CABG  . HTN (hypertension)   . Hyperlipidemia   . BPH (benign prostatic hypertrophy)   . Sigmoid diverticulitis   . Internal hemorrhoids   . Basal cell carcinoma   . Disequilibrium 04/03/2012    Past Surgical History  Procedure Laterality Date  . Coronary artery bypass graft    . Colonoscopy    . Shoulder surgery      x2    No Known Allergies   Prior to Admission medications   Medication Sig Start Date End Date Taking? Authorizing Provider  acetaminophen (TYLENOL) 325 MG tablet Take 650 mg by mouth every 6 (six) hours as needed for mild pain or moderate pain.   Yes Historical Provider, MD  atenolol (TENORMIN) 25 MG tablet Take 1 tablet (25 mg total) by mouth 2 (two) times daily. 08/28/13  Yes Mosie Lukes, MD    cholecalciferol (VITAMIN D) 1000 UNITS tablet Take 1,000 Units by mouth daily as needed.    Yes Historical Provider, MD  diphenhydramine-acetaminophen (TYLENOL PM) 25-500 MG TABS Take 1 tablet by mouth at bedtime as needed (insomnia).   Yes Historical Provider, MD  finasteride (PROSCAR) 5 MG tablet Take 1 tablet (5 mg total) by mouth daily. 08/07/13  Yes Mosie Lukes, MD  lisinopril (PRINIVIL,ZESTRIL) 40 MG tablet Take 1 tablet (40 mg total) by mouth daily. 02/15/14  Yes Larey Dresser, MD  lisinopril-hydrochlorothiazide (PRINZIDE,ZESTORETIC) 20-12.5 MG per tablet Take 1 tablet by mouth 2 (two) times daily.   Yes Historical Provider, MD  lovastatin (MEVACOR) 20 MG tablet Take 1 tablet (20 mg total) by mouth at bedtime. 04/03/14  Yes Larey Dresser, MD  MELATONIN PO Take 1 tablet by mouth at bedtime as needed.    Yes Historical Provider, MD  Multiple Vitamin (MULTIVITAMIN) capsule Take 1 capsule by mouth every other day.    Yes Historical Provider, MD  multivitamin-lutein (OCUVITE-LUTEIN) CAPS Take 2 capsules by mouth daily.    Yes Historical Provider, MD  mupirocin ointment (BACTROBAN) 2 % Place 1 application into the nose 3 (three) times daily. 04/05/14  Yes Meriam Sprague Saguier, PA-C  Probiotic Product (PROBIOTIC DAILY PO) Take 1 capsule by mouth daily.  Yes Historical Provider, MD  mupirocin ointment (BACTROBAN) 2 % Apply 1 application topically 2 (two) times daily. 04/17/14   Meriam Sprague Saguier, PA-C  sulfamethoxazole-trimethoprim (BACTRIM DS) 800-160 MG per tablet Take 1 tablet by mouth 2 (two) times daily. 03/21/14   Meriam Sprague Saguier, PA-C     (Not in a hospital admission) Family History  Problem Relation Age of Onset  . Coronary artery disease Father   . Hyperlipidemia Father   . Bipolar disorder Mother   . Alcohol abuse    . Heart attack Father     History   Social History  . Marital Status: Married    Spouse Name: N/A  . Number of Children: 2  . Years of Education: N/A    Occupational History  . 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 Smoking status: Former Smoker -- 0.50 packs/day for 15 years    Quit date: 06/29/1971  . Smokeless tobacco: Never Used  . Alcohol Use: Yes     Comment: 1-2 glasses of wine  . Drug Use: No  . Sexual Activity: Not on file   Other Topics Concern  . Not on file   Social History Narrative     ROS:    As stated in the HPI and negative for all other systems.  Physical Exam: Blood pressure 114/65, pulse 102, temperature 97.6 F (36.4 C), temperature source Oral, resp. rate 16, height 5\' 5"  (1.651 m), weight 148 lb (67.132 kg), SpO2 100 %.  GENERAL:  Well appearing HEENT:  Pupils equal round and reactive, fundi not visualized, oral mucosa unremarkable NECK:  No jugular venous distention, waveform within normal limits, carotid upstroke brisk and symmetric, no bruits, no thyromegaly LYMPHATICS:  No cervical, inguinal adenopathy LUNGS:  Clear to auscultation bilaterally BACK:  No CVA tenderness CHEST:  Unremarkable HEART:  PMI not displaced or sustained,S1 and S2 within normal limits, no S3, no clicks, no rubs, no murmurs, irregular ABD:  Flat, positive bowel sounds normal in frequency in pitch, no bruits, no rebound, no guarding, no midline pulsatile mass, no hepatomegaly, no splenomegaly EXT:  2 plus pulses throughout, no edema, no cyanosis no clubbing SKIN:  No rashes no nodules NEURO:  Cranial nerves II through XII grossly intact, motor grossly intact throughout PSYCH:  Cognitively intact, oriented to person place and time   Labs: Lab Results  Component Value Date   BUN 18 09/13/2014   Lab Results  Component Value Date   CREATININE 1.23 09/13/2014   Lab Results  Component Value Date   NA 129* 09/13/2014   K 4.6 09/13/2014   CL 96 09/13/2014   CO2 24 09/13/2014   No results found for: TROPONINI Lab Results  Component Value Date   WBC 11.1* 09/13/2014   HGB 12.9*  09/13/2014   HCT 37.4* 09/13/2014   MCV 82.7 09/13/2014   PLT 265 09/13/2014    Radiology:   CT:  Atrophy with patchy periventricular small vessel disease. No intracranial mass, hemorrhage, or acute appearing infarct. No extra-axial fluid collection. Small right frontal scalp hematoma.  EKG:  Atrial fib, rate 103, axis WNL, intervals WNL, no acute ST T wave changes.   09/13/2014   ASSESSMENT AND PLAN:   SYNCOPE:  I suspect that this was related to not eating and probably some orthostasis. Blood pressure was low when he came in. I doubt that this is related to his atrial fibrillation. We will check orthostatic blood pressures and observe him on  telemetry.  ATRIAL FIB:  This seems to be new. The rate seems to be controlled. There is no contraindication to anticoagulation. Justin [name] has a CHA2DS2 - VASc score of 4 with a risk of stroke of 4% .  I discussed at length the risks benefits of anticoagulation and the disease. His wife actually has atrial fibrillation so they understand. He will stop his aspirin which apparently he hasn't been taking anyway. I will start Xarelto.  AS:   this is moderate. No change in therapy is indicated.   CAD:  I don't suspect any acute coronary syndrome. No ischemia workup is planned.   HYPONATREMIA:  This has been noted before and is near baseline.  No further inpatient workup is planned.  SignedMinus Breeding 09/13/2014, 11:49 AM

## 2014-09-13 NOTE — ED Notes (Signed)
Received pt via EMS [facility] with c/o new onset afib, fatigue and general weakness. Pt stood up and felt dizziness causing him to fall, pt hit his head but no +LOC. Pt has hematoma to right side of head and c/o right sided shoulder pain. Pt had a near syncopal episode with ems when standing. CBG 133. BP 106/74 for EMS. Symptoms onset around 0800 today.

## 2014-09-13 NOTE — Progress Notes (Addendum)
Called ED nurse to receive report, ED nurse was not available at the time, she said she is going to call back at my number [phone]  Pedro Earls 1:46 PM 09/13/2014   Received patient's report from Benjamine Mola, ED RN [phone]) 2:10 PM

## 2014-09-13 NOTE — Progress Notes (Signed)
PHARMACIST - PHYSICIAN ORDER COMMUNICATION  CONCERNING: P&T Medication Policy on Herbal Medications  DESCRIPTION:  This patient's order for:  Melatonin  has been noted.  This product(s) is classified as an "herbal" or natural product. Due to a lack of definitive safety studies or FDA approval, nonstandard manufacturing practices, plus the potential risk of unknown drug-drug interactions while on inpatient medications, the Pharmacy and Therapeutics Committee does not permit the use of "herbal" or natural products of this type within [facility].   ACTION TAKEN: The pharmacy department is unable to verify this order at this time and your patient has been informed of this safety policy. Please reevaluate patient's clinical condition at discharge and address if the herbal or natural product(s) should be resumed at that time.   Alycia Rossetti, PharmD, BCPS Clinical Pharmacist Pager: [phone] 09/13/2014 2:34 PM

## 2014-09-13 NOTE — ED Provider Notes (Signed)
CSN: [id number]     Arrival date & time 09/13/14  7106 History   First MD Initiated Contact with Patient 09/13/14 [phone]     Chief Complaint  Patient presents with  . Atrial Fibrillation  . Fatigue  . Head Injury     (Consider location/radiation/quality/duration/timing/severity/associated sxs/prior Treatment) HPI Comments: 81 year old male with history of high blood pressure, coronary artery bypass, prostate hypertrophy, aortic stenosis, lipids presents with general weakness and dizziness. Patient is volunteering, patient did not have breakfast or anything eat or drink this morning because he is in a research study for vitamin D and after working for an hour started feeling dizzy and mild confusion.  His wife noticed he was mildly confused and then she turned back he had fallen and hit his head with brief loss of consciousness no seizure activity. No history of passing out. Patient does not feel he completely passed out he just felt generally weak and lightheaded/dizzy. Patient has no chest pain or shortness of breath. Patient has pain to the right shoulder and right head with palpation. Patient is on aspirin.  Patient is a 81 y.o. male presenting with atrial fibrillation and head injury. The history is provided by the patient.  Atrial Fibrillation Associated symptoms include headaches. Pertinent negatives include no chest pain, no abdominal pain and no shortness of breath.  Head Injury Associated symptoms: headache   Associated symptoms: no neck pain, no seizures and no vomiting     Past Medical History  Diagnosis Date  . CAD (coronary artery disease)     s/p CABG  . HTN (hypertension)   . Hyperlipidemia   . BPH (benign prostatic hypertrophy)   . Sigmoid diverticulitis   . Internal hemorrhoids   . Basal cell carcinoma   . Disequilibrium 04/03/2012  . Aortic stenosis     Moderate  . Myocardial infarction    Past Surgical History  Procedure Laterality Date  . Coronary artery bypass  graft    . Colonoscopy    . Shoulder surgery      x2   Family History  Problem Relation Age of Onset  . Coronary artery disease Father   . Hyperlipidemia Father   . Bipolar disorder Mother   . Alcohol abuse    . Heart attack Father    History  Substance Use Topics  . Smoking status: Former Smoker -- 0.50 packs/day for 15 years    Quit date: 06/29/1971  . Smokeless tobacco: Never Used  . Alcohol Use: Yes     Comment: 1-2 glasses of wine    Review of Systems  Constitutional: Negative for fever and chills.  HENT: Negative for congestion.   Eyes: Negative for visual disturbance.  Respiratory: Negative for shortness of breath.   Cardiovascular: Negative for chest pain.  Gastrointestinal: Negative for vomiting and abdominal pain.  Genitourinary: Negative for dysuria and flank pain.  Musculoskeletal: Positive for arthralgias. Negative for back pain, neck pain and neck stiffness.  Skin: Negative for rash.  Neurological: Positive for dizziness, weakness (general), light-headedness and headaches. Negative for seizures.  Psychiatric/Behavioral: Positive for confusion.      Allergies  Review of patient's allergies indicates no known allergies.  Home Medications   Prior to Admission medications   Medication Sig Start Date End Date Taking? Authorizing Provider  acetaminophen (TYLENOL) 325 MG tablet Take 650 mg by mouth every 6 (six) hours as needed for mild pain or moderate pain.   Yes Historical Provider, MD  atenolol (TENORMIN) 25 MG tablet Take  1 tablet (25 mg total) by mouth 2 (two) times daily. 08/28/13  Yes Mosie Lukes, MD  cholecalciferol (VITAMIN D) 1000 UNITS tablet Take 1,000 Units by mouth daily as needed.    Yes Historical Provider, MD  diphenhydramine-acetaminophen (TYLENOL PM) 25-500 MG TABS Take 1 tablet by mouth at bedtime as needed (insomnia).   Yes Historical Provider, MD  finasteride (PROSCAR) 5 MG tablet Take 1 tablet (5 mg total) by mouth daily. 08/07/13  Yes  Mosie Lukes, MD  lisinopril-hydrochlorothiazide (PRINZIDE,ZESTORETIC) 20-12.5 MG per tablet Take 1 tablet by mouth 2 (two) times daily.   Yes Historical Provider, MD  lovastatin (MEVACOR) 20 MG tablet Take 1 tablet (20 mg total) by mouth at bedtime. 04/03/14  Yes Larey Dresser, MD  MELATONIN PO Take 1 tablet by mouth at bedtime as needed.    Yes Historical Provider, MD  Multiple Vitamin (MULTIVITAMIN) capsule Take 1 capsule by mouth every other day.    Yes Historical Provider, MD  multivitamin-lutein (OCUVITE-LUTEIN) CAPS Take 2 capsules by mouth daily.    Yes Historical Provider, MD  mupirocin ointment (BACTROBAN) 2 % Place 1 application into the nose 3 (three) times daily. 04/05/14  Yes Meriam Sprague Saguier, PA-C  Probiotic Product (PROBIOTIC DAILY PO) Take 1 capsule by mouth daily.   Yes Historical Provider, MD  sulfamethoxazole-trimethoprim (BACTRIM DS) 800-160 MG per tablet Take 1 tablet by mouth 2 (two) times daily. 03/21/14   Meriam Sprague Saguier, PA-C   BP 153/92 mmHg  Pulse 99  Temp(Src) 97.4 F (36.3 C) (Oral)  Resp 16  Ht 5\' 5"  (1.651 m)  Wt 140 lb 1.6 oz (63.549 kg)  BMI 23.31 kg/m2  SpO2 100% Physical Exam  Constitutional: He is oriented to person, place, and time. He appears well-developed and well-nourished.  HENT:  Head: Normocephalic.  Dry mucous membranes  patient has hematoma right for head no step-off. No active bleeding.  Eyes: Right eye exhibits no discharge. Left eye exhibits no discharge.  Neck: Normal range of motion. Neck supple. No tracheal deviation present.  Cardiovascular: Normal rate.  An irregularly irregular rhythm present.  Pulmonary/Chest: Effort normal and breath sounds normal.  Abdominal: Soft. He exhibits no distension. There is no tenderness. There is no guarding.  Musculoskeletal: He exhibits no edema.  Mild right lateral and posterior shoulder tenderness with palpation, mild decreased flexion however full external and internal range of motion. No  tenderness to hips or knees bilateral.  Neurological: He is alert and oriented to person, place, and time. No cranial nerve deficit. GCS eye subscore is 4. GCS verbal subscore is 5. GCS motor subscore is 6.  Fatigue appearance. 5+ strength in UE and LE with f/e at major joints. Sensation to palpation intact in UE and LE. CNs 2-12 grossly intact.  EOMFI.  PERRL.   Finger nose and coordination intact bilateral.   Visual fields intact to finger testing.   Skin: Skin is warm. No rash noted.  Psychiatric: He has a normal mood and affect.  Nursing note and vitals reviewed.   ED Course  Procedures (including critical care time) Labs Review Labs Reviewed  CBC - Abnormal; Notable for the following:    WBC 11.1 (*)    Hemoglobin 12.9 (*)    HCT 37.4 (*)    All other components within normal limits  BASIC METABOLIC PANEL - Abnormal; Notable for the following:    Sodium 129 (*)    Glucose, Bld 120 (*)    GFR calc non  Af Amer 52 (*)    GFR calc Af Amer 60 (*)    All other components within normal limits  URINALYSIS, ROUTINE W REFLEX MICROSCOPIC  I-STAT TROPOININ, ED    Imaging Review Dg Shoulder Right  09/13/2014   CLINICAL DATA:  Fall this morning. Right shoulder pain. Initial encounter.  EXAM: RIGHT SHOULDER - 2+ VIEW  COMPARISON:  MRI the right shoulder 09/21/2004  FINDINGS: The right shoulder is located. Mild degenerative changes are again noted. No acute fracture is present.  Calcified mediastinal lymph nodes are compatible with granulomatous disease.  IMPRESSION: 1. Degenerative changes the right shoulder without evidence for acute fracture or dislocation.   Electronically Signed   By: San Morelle M.D.   On: 09/13/2014 10:12   Ct Head Wo Contrast  09/13/2014   CLINICAL DATA:  Dizziness.  Recent fall.  Near syncope earlier today  EXAM: CT HEAD WITHOUT CONTRAST  TECHNIQUE: Contiguous axial images were obtained from the base of the skull through the vertex without intravenous  contrast.  COMPARISON:  None.  FINDINGS: There is moderate diffuse atrophy. There is no intracranial mass, hemorrhage, extra-axial fluid collection, or midline shift. There is patchy small vessel disease in the centra semiovale bilaterally. Elsewhere gray-white compartments appear normal. No acute infarct apparent. The bony calvarium appears intact. The mastoid air cells are clear. There is a small right frontal scalp hematoma.  IMPRESSION: Atrophy with patchy periventricular small vessel disease. No intracranial mass, hemorrhage, or acute appearing infarct. No extra-axial fluid collection. Small right frontal scalp hematoma.   Electronically Signed   By: Lowella Grip III M.D.   On: 09/13/2014 10:36     EKG Interpretation None     Ekg reviewed hr 103 irregular/ a fib, normal qt, no acute st elevation, rvr MDM   Final diagnoses:  Atrial fibrillation, unspecified  Near syncope  Dehydration  Shoulder contusion, right, initial encounter  Acute head injury, initial encounter   Patient with cardiac history presents after near syncope/dizzy episode. Patient has improved since, nonfocal neuro exam, general weakness/fatigue appearance. Clinically most likely from not eating or drinking this morning and then working however with cardiac history plan for blood work, cardiac screen, CT head with head injury.  Patient has no midline neck pain, full range of motion. Likely orthostatic component especially with stenosis history.  This patients CHA2DS2-VASc Score and unadjusted Ischemic Stroke Rate (% per year) is equal to 4.8 % stroke rate/year from a score of 4  Above score calculated as 1 point each if present [CHF, HTN, DM, Vascular=MI/PAD/Aortic Plaque, Age if 65-74, or Male] Above score calculated as 2 points each if present [Age > 75, or Stroke/TIA/TE]  Cardiology consulted and admitted patient.  The patients results and plan were reviewed and discussed.   Any x-rays performed were  personally reviewed by myself.   Differential diagnosis were considered with the presenting HPI.  Medications  acetaminophen (TYLENOL) tablet 650 mg (650 mg Oral Given 09/14/14 2151)  ondansetron (ZOFRAN) injection 4 mg (not administered)  cholecalciferol (VITAMIN D) tablet 1,000 Units (1,000 Units Oral Given 09/14/14 1031)  finasteride (PROSCAR) tablet 5 mg (5 mg Oral Given 09/14/14 1031)  multivitamin-lutein (OCUVITE-LUTEIN) capsule 2 capsule (2 capsules Oral Given 09/14/14 1031)  mupirocin ointment (BACTROBAN) 2 % 1 application (1 application Nasal Given 09/14/14 2152)  pravastatin (PRAVACHOL) tablet 20 mg (20 mg Oral Given 09/14/14 1750)  zolpidem (AMBIEN) tablet 5 mg (5 mg Oral Given 09/14/14 2152)  atenolol (TENORMIN) tablet 25 mg (25  mg Oral Given 09/14/14 2151)  acidophilus (RISAQUAD) capsule 1 capsule (1 capsule Oral Given 09/14/14 1032)  diltiazem (CARDIZEM) tablet 30 mg (30 mg Oral Given 09/14/14 1819)  sodium chloride 0.9 % bolus 500 mL (0 mLs Intravenous Duplicate 2/39/53 2023)  sodium chloride 0.9 % bolus 500 mL (0 mLs Intravenous Stopped 09/13/14 1042)  acetaminophen (TYLENOL) tablet 650 mg (650 mg Oral Given 09/13/14 1042)    Filed Vitals:   09/14/14 0431 09/14/14 1427 09/14/14 1800 09/14/14 1917  BP: 95/64 110/67 142/86 153/92  Pulse: 63 67 89 99  Temp: 98 F (36.7 C) 98.2 F (36.8 C)  97.4 F (36.3 C)  TempSrc: Oral Oral  Oral  Resp: 18 18  16   Height:      Weight:      SpO2: 97% 97%  100%    Final diagnoses:  Atrial fibrillation, unspecified  Near syncope  Dehydration  Shoulder contusion, right, initial encounter  Acute head injury, initial encounter    Admission/ observation were discussed with the admitting physician, patient and/or family and they are comfortable with the plan.    Elnora Morrison, MD 09/14/14 [phone]

## 2014-09-13 NOTE — Discharge Instructions (Addendum)
***  PLEASE REMEMBER TO BRING ALL OF YOUR MEDICATIONS TO EACH OF YOUR FOLLOW-UP OFFICE VISITS.  

## 2014-09-13 NOTE — Progress Notes (Signed)
ANTICOAGULATION CONSULT NOTE - Initial Consult  Pharmacy Consult for xarelto Indication: atrial fibrillation  No Known Allergies  Patient Measurements: Height: 5\' 5"  (165.1 cm) Weight: 148 lb (67.132 kg) IBW/kg (Calculated) : 61.5   Vital Signs: Temp: 97.6 F (36.4 C) (03/18 1040) Temp Source: Oral (03/18 0854) BP: 95/60 mmHg (03/18 1300) Pulse Rate: 86 (03/18 1300)  Labs:  Recent Labs  09/13/14 1035  HGB 12.9*  HCT 37.4*  PLT 265  CREATININE 1.23    Estimated Creatinine Clearance: 38.9 mL/min (by C-G formula based on Cr of 1.23).   Medical History: Past Medical History  Diagnosis Date  . CAD (coronary artery disease)     s/p CABG  . HTN (hypertension)   . Hyperlipidemia   . BPH (benign prostatic hypertrophy)   . Sigmoid diverticulitis   . Internal hemorrhoids   . Basal cell carcinoma   . Disequilibrium 04/03/2012  . Aortic stenosis     Moderate    Assessment: 81 yo M in ED with new onset afib.  Pharmacy consulted to dose xarelto.  Wt 67.1 kg, age 81, creat 1.23.  His creat back on 03/06/14 was 1.0.  His creat clearance is 42 ml/min per C&C with creat 1.23 and 53 with creat cl 1.0.   Plan:  -xarelto 20 mg qday with supper -f/u renal fxn, may need to decrease to 15 mg q supper -pt and wife educated about xarelto and all questions answered  Eudelia Bunch, Pharm.D. [phone] 09/13/2014 1:32 PM

## 2014-09-14 DIAGNOSIS — R2681 Unsteadiness on feet: Secondary | ICD-10-CM

## 2014-09-14 DIAGNOSIS — R55 Syncope and collapse: Secondary | ICD-10-CM | POA: Diagnosis not present

## 2014-09-14 DIAGNOSIS — Z7901 Long term (current) use of anticoagulants: Secondary | ICD-10-CM

## 2014-09-14 DIAGNOSIS — I480 Paroxysmal atrial fibrillation: Secondary | ICD-10-CM | POA: Diagnosis not present

## 2014-09-14 DIAGNOSIS — S0990XA Unspecified injury of head, initial encounter: Secondary | ICD-10-CM | POA: Diagnosis not present

## 2014-09-14 DIAGNOSIS — I951 Orthostatic hypotension: Secondary | ICD-10-CM | POA: Diagnosis not present

## 2014-09-14 DIAGNOSIS — Z5181 Encounter for therapeutic drug level monitoring: Secondary | ICD-10-CM | POA: Insufficient documentation

## 2014-09-14 MED ORDER — DILTIAZEM HCL 30 MG PO TABS
30.0000 mg | ORAL_TABLET | Freq: Four times a day (QID) | ORAL | Status: DC
  Administered 2014-09-14 – 2014-09-15 (×3): 30 mg via ORAL
  Filled 2014-09-14 (×7): qty 1

## 2014-09-14 NOTE — Progress Notes (Signed)
SUBJECTIVE:  Patient is stable this morning. He is feeling better. He says that he is not back to full strength. I had a careful discussion with the patient and his wife about his symptoms yesterday. He has been having difficulties with his gait. He has fallen on several occasions prior to yesterday's event. Each time recently this was due to his gait problem. Yesterday it appeared that he had real syncope. He admitted told not to [facility] anything before 11 AM yesterday and this contributed to his problem. Orthostatic blood pressure checks have not been done completely yet and they're pending for this morning. His pressure has been on the low side. Atrial fib rate has varied. While resting he has had relatively low rates. With movement he's had significant increase in his rates.   Filed Vitals:   09/13/14 1639 09/13/14 2027 09/13/14 2218 09/14/14 0431  BP: 113/89 108/65 100/64 95/64  Pulse: 78 60 69 63  Temp:   97.5 F (36.4 C) 98 F (36.7 C)  TempSrc:   Oral Oral  Resp:  18 18 18   Height:      Weight:      SpO2:  98% 98% 97%    No intake or output data in the 24 hours ending 09/14/14 0958  LABS: Basic Metabolic Panel:  Recent Labs  09/13/14 1035  NA 129*  K 4.6  CL 96  CO2 24  GLUCOSE 120*  BUN 18  CREATININE 1.23  CALCIUM 8.9   Liver Function Tests: No results for input(s): AST, ALT, ALKPHOS, BILITOT, PROT, ALBUMIN in the last 72 hours. No results for input(s): LIPASE, AMYLASE in the last 72 hours. CBC:  Recent Labs  09/13/14 1035  WBC 11.1*  HGB 12.9*  HCT 37.4*  MCV 82.7  PLT 265   Cardiac Enzymes: No results for input(s): CKTOTAL, CKMB, CKMBINDEX, TROPONINI in the last 72 hours. BNP: Invalid input(s): POCBNP D-Dimer: No results for input(s): DDIMER in the last 72 hours. Hemoglobin A1C: No results for input(s): HGBA1C in the last 72 hours. Fasting Lipid Panel: No results for input(s): CHOL, HDL, LDLCALC, TRIG, CHOLHDL, LDLDIRECT in the last 72  hours. Thyroid Function Tests: No results for input(s): TSH, T4TOTAL, T3FREE, THYROIDAB in the last 72 hours.  Invalid input(s): FREET3  RADIOLOGY: Dg Shoulder Right  09/13/2014   CLINICAL DATA:  Fall this morning. Right shoulder pain. Initial encounter.  EXAM: RIGHT SHOULDER - 2+ VIEW  COMPARISON:  MRI the right shoulder 09/21/2004  FINDINGS: The right shoulder is located. Mild degenerative changes are again noted. No acute fracture is present.  Calcified mediastinal lymph nodes are compatible with granulomatous disease.  IMPRESSION: 1. Degenerative changes the right shoulder without evidence for acute fracture or dislocation.   Electronically Signed   By: San Morelle M.D.   On: 09/13/2014 10:12   Ct Head Wo Contrast  09/13/2014   CLINICAL DATA:  Dizziness.  Recent fall.  Near syncope earlier today  EXAM: CT HEAD WITHOUT CONTRAST  TECHNIQUE: Contiguous axial images were obtained from the base of the skull through the vertex without intravenous contrast.  COMPARISON:  None.  FINDINGS: There is moderate diffuse atrophy. There is no intracranial mass, hemorrhage, extra-axial fluid collection, or midline shift. There is patchy small vessel disease in the centra semiovale bilaterally. Elsewhere gray-white compartments appear normal. No acute infarct apparent. The bony calvarium appears intact. The mastoid air cells are clear. There is a small right frontal scalp hematoma.  IMPRESSION: Atrophy with patchy periventricular  small vessel disease. No intracranial mass, hemorrhage, or acute appearing infarct. No extra-axial fluid collection. Small right frontal scalp hematoma.   Electronically Signed   By: Lowella Grip III M.D.   On: 09/13/2014 10:36    PHYSICAL EXAM  patient is oriented to person time and place. Affect is normal. He has ecchymosis on the right side of his face from trauma from his syncopal episode yesterday. His wife is in the room. Lungs are clear. Respiratory effort is not  labored. Cardiac exam reveals S1 and S2. The rhythm is irregularly irregular. The rate is increased this morning. There is no peripheral edema. Abdomen is soft.   TELEMETRY: I have reviewed telemetry today September 14, 2014. There is atrial fibrillation. The rate varies from as low as 50 to as high as 135.   ASSESSMENT AND PLAN:    Aortic stenosis    Recent echo had shown good left ventricular function with an EF of 60%. There is aortic stenosis that is felt to be moderate. The mean gradient was 19 mmHg. He does not have severe aortic stenosis. However his aortic valve disease may play some role with his overall problems.    Gait instability      I'm quite concerned about his history of gait instability and falling. The plan yesterday was to start Xarelto.  I have decided to stop this.  I feel that we need to be sure that his blood pressures stable and that all other parameters are stable before starting an anticoagulant.     Near syncope     It appears that he probably had true syncope yesterday. I cannot tell if this was from orthostasis or other issues. Atrial fibrillation is a new diagnosis. We do not know yet if he is going in and out of it with long pauses. I'm obtaining orthostatic blood pressure checks this morning. I have stopped his ACE inhibitor and diuretic. I will consider whether we can add diltiazem watching his blood pressure and his heart rate variation. He will probably need a Holter or an event recorder upon discharge to get a better handle on the range of his heart rate.     Paroxysmal atrial fibrillation Atrial fibrillation diagnosis is new. He has varying rates. He may have a component of sick sinus syndrome         Head trauma     He had head trauma with his syncope yesterday. As of today I feel it is most prudent to not use anticoagulation for his atrial fibrillation yet.     Alteration in anticoagulation    Atrial fib was diagnosed yesterday. Anticoagulation was started  but I have chosen to stop it until we have a better handle on his blood pressure and his overall tendency to falls and syncope.   I've had a careful discussion with the patient and his wife today. We reviewed all of the issues concerning my choice of medications today. I explained at great length why I have chosen to not anticoagulate him today. The patient once to go home. Fortunately his wife agrees that staying here will be in his best interest.   Dola Argyle 09/14/2014 9:58 AM

## 2014-09-14 NOTE — Progress Notes (Signed)
UR completed 

## 2014-09-14 NOTE — Progress Notes (Signed)
Patient had orthostatic blood pressure checked. Systolic pressure was in the range of 95-100 when lying and sitting. With standing the pressure dropped to 90 systolic. He did not have any significant symptoms. Heart rate increased progressively from lying to sitting and standing. As he walked around the room further he had further increase in heart rate up to 130.  I stopped his other antihypertensives. We will have to see if he can tolerate a small dose of diltiazem. I'm hopeful that his systolic pressure will not suffer from this when standing. It's possible that some of his decreased blood pressure is related to his marked increased heart rate.  Daryel November, MD

## 2014-09-15 ENCOUNTER — Encounter (HOSPITAL_COMMUNITY): Payer: Self-pay | Admitting: Nurse Practitioner

## 2014-09-15 ENCOUNTER — Other Ambulatory Visit: Payer: Self-pay | Admitting: Nurse Practitioner

## 2014-09-15 DIAGNOSIS — R55 Syncope and collapse: Secondary | ICD-10-CM

## 2014-09-15 DIAGNOSIS — R42 Dizziness and giddiness: Secondary | ICD-10-CM

## 2014-09-15 DIAGNOSIS — I480 Paroxysmal atrial fibrillation: Secondary | ICD-10-CM | POA: Diagnosis present

## 2014-09-15 MED ORDER — DILTIAZEM HCL ER COATED BEADS 120 MG PO CP24: 120.0000 mg | ORAL_CAPSULE | Freq: Every day | ORAL | Status: DC

## 2014-09-15 MED ORDER — ASPIRIN EC 81 MG PO TBEC: 81.0000 mg | DELAYED_RELEASE_TABLET | Freq: Every day | ORAL | Status: DC

## 2014-09-15 NOTE — Discharge Summary (Signed)
Discharge Summary   Patient ID: Justin [name],  MRN: [id number], DOB/AGE: Jun 28, 1929 81 y.o.  Admit date: 09/13/2014 Discharge date: 09/15/2014  Primary Care Provider: [name] Primary Cardiologist: Einar Crow, MD   Discharge Diagnoses Principal Problem:   Syncope Active Problems:   PAF (paroxysmal atrial fibrillation)  **CHA2DS2VASc = 4 - anticoagulation not started secondary to gait instability and syncope.   Orthostatic hypotension   CAD, ARTERY BYPASS GRAFT   Hyperlipidemia   Gait instability   Head trauma  Allergies No Known Allergies  Procedures  Non-contrast Head CT 3.18.2016  IMPRESSION: Atrophy with patchy periventricular small vessel disease. No intracranial mass, hemorrhage, or acute appearing infarct. No extra-axial fluid collection. Small right frontal scalp hematoma. _____________   History of Present Illness  81 year old male with a prior history of coronary artery disease status post coronary artery bypass grafting. He also has a history of hypertension and hyperlipidemia, along with moderate aortic stenosis. Recently, he has been experiencing gait instability as well.  He was in his usual state of health until the morning of March 18, when he was feeling weak in the setting of fasting for blood work at [facility]. He stood in his home and suddenly lost consciousness, falling and striking his head. He was taken to the [facility] ED where a head CT was nonacute. He was found to be in relatively rate controlled atrial fibrillation, which was a new diagnosis for him. He was seen by cardiology and admitted for further evaluation.  Hospital Course  Following admission, in the setting of a CHA2DS2VASc = 4, a decision was made to initiate xarelto therapy. After further questioning however, we learned that the patient has been experiencing gait instability and decision was ultimately made to discontinue xarelto for the time being.  He was initially maintained on  his home dose of atenolol and diltiazem was also initiated. He was noted to have relative hypotension and mild orthostasis by blood pressure with more pronounced orthostasis by heart rate, with rates rising into the 130's while in atrial fibrillation. Home doses of ACE inhibitor and diuretic therapy were discontinued in that setting.  He converted to sinus rhythm overnight without any significant postconversion pauses.  There remains significant concern that paroxysmal atrial fibrillation with postconversion pauses may be playing a role in his syncope. As a result, we have discontinued atenolol therapy and will continue diltiazem CD 120 mg daily. We will also arrange for a 30 day event monitor to assess his burden of atrial fibrillation along with the possibility of postconversion pauses or other arrhythmic causes of syncope. He will follow-up with Dr. Marigene Ehlers within the next 2 weeks as scheduled. He will be discharged home today in good condition.   Discharge Vitals Blood pressure 123/70, pulse 69, temperature 97.5 F (36.4 C), temperature source Oral, resp. rate 16, height 5\' 5"  (1.651 m), weight 140 lb 1.6 oz (63.549 kg), SpO2 98 %.  Filed Weights   09/13/14 0902 09/13/14 1421  Weight: 148 lb (67.132 kg) 140 lb 1.6 oz (63.549 kg)   Labs  CBC  Recent Labs  09/13/14 1035  WBC 11.1*  HGB 12.9*  HCT 37.4*  MCV 82.7  PLT 315   Basic Metabolic Panel  Recent Labs  09/13/14 1035  NA 129*  K 4.6  CL 96  CO2 24  GLUCOSE 120*  BUN 18  CREATININE 1.23  CALCIUM 8.9   Disposition  Pt is being discharged home today in good condition.  Follow-up Plans & Appointments  Follow-up Information    Follow up with Loralie Champagne, MD On 09/26/2014.   Specialty:  Cardiology   Why:  8:15 AM   Contact information:   [address] [place] [address] [phone]       Follow up with [name], MD.   Specialty:  Family Medicine   Why:  as scheduled.   Contact  information:   [place] [address] [phone]       Discharge Medications    Medication List    STOP taking these medications        atenolol 25 MG tablet  Commonly known as:  TENORMIN     lisinopril-hydrochlorothiazide 20-12.5 MG per tablet  Commonly known as:  PRINZIDE,ZESTORETIC      TAKE these medications        acetaminophen 325 MG tablet  Commonly known as:  TYLENOL  Take 650 mg by mouth every 6 (six) hours as needed for mild pain or moderate pain.     aspirin EC 81 MG tablet  Take 1 tablet (81 mg total) by mouth daily.     cholecalciferol 1000 UNITS tablet  Commonly known as:  VITAMIN D  Take 1,000 Units by mouth daily as needed.     diltiazem 120 MG 24 hr capsule  Commonly known as:  CARDIZEM CD  Take 1 capsule (120 mg total) by mouth daily.     diphenhydramine-acetaminophen 25-500 MG Tabs  Commonly known as:  TYLENOL PM  Take 1 tablet by mouth at bedtime as needed (insomnia).     finasteride 5 MG tablet  Commonly known as:  PROSCAR  Take 1 tablet (5 mg total) by mouth daily.     lovastatin 20 MG tablet  Commonly known as:  MEVACOR  Take 1 tablet (20 mg total) by mouth at bedtime.     MELATONIN PO  Take 1 tablet by mouth at bedtime as needed.     multivitamin capsule  Take 1 capsule by mouth every other day.     multivitamin-lutein Caps capsule  Take 2 capsules by mouth daily.     mupirocin ointment 2 %  Commonly known as:  BACTROBAN  Place 1 application into the nose 3 (three) times daily.     PROBIOTIC DAILY PO  Take 1 capsule by mouth daily.       Outstanding Labs/Studies  F/U 30 day event monitor  Duration of Discharge Encounter   Greater than 30 minutes including physician time.  Signed, Murray Hodgkins NP 09/15/2014, 9:23 AM Patient seen and examined. I agree with the assessment and plan as detailed above. See also my additional thoughts below.   Please see my progress note from today  also. I spoke with the patient and his wife. We have made a very careful plan to further assess his paroxysmal atrial fibrillation, recent syncope, hypertension, need for anticoagulation, gait instability. I made the decision for him to go home.  Dola Argyle, MD, [facility] 09/15/2014 12:25 PM

## 2014-09-15 NOTE — Progress Notes (Signed)
SUBJECTIVE: Patient feels well today. He had continued increased heart rate with his atrial fib and ambulation yesterday. Fortunately he converted to sinus rhythm this morning. He cannot tell the difference in his rhythms. He feels well and wants to go home today. Blood pressure had been low with atrial fibrillation yesterday. His pressure is much better today in sinus rhythm. I had stopped his other antihypertensives yesterday and started a small dose of diltiazem. Filed Vitals:   09/14/14 1800 09/14/14 1917 09/15/14 0057 09/15/14 0345  BP: 142/86 153/92 119/70 123/70  Pulse: 89 99 78 69  Temp:  97.4 F (36.3 C)  97.5 F (36.4 C)  TempSrc:  Oral  Oral  Resp:  16  16  Height:      Weight:      SpO2:  100%  98%     Intake/Output Summary (Last 24 hours) at 09/15/14 0846 Last data filed at 09/15/14 0809  Gross per 24 hour  Intake    240 ml  Output      0 ml  Net    240 ml    LABS: Basic Metabolic Panel:  Recent Labs  09/13/14 1035  NA 129*  K 4.6  CL 96  CO2 24  GLUCOSE 120*  BUN 18  CREATININE 1.23  CALCIUM 8.9   Liver Function Tests: No results for input(s): AST, ALT, ALKPHOS, BILITOT, PROT, ALBUMIN in the last 72 hours. No results for input(s): LIPASE, AMYLASE in the last 72 hours. CBC:  Recent Labs  09/13/14 1035  WBC 11.1*  HGB 12.9*  HCT 37.4*  MCV 82.7  PLT 265   Cardiac Enzymes: No results for input(s): CKTOTAL, CKMB, CKMBINDEX, TROPONINI in the last 72 hours. BNP: Invalid input(s): POCBNP D-Dimer: No results for input(s): DDIMER in the last 72 hours. Hemoglobin A1C: No results for input(s): HGBA1C in the last 72 hours. Fasting Lipid Panel: No results for input(s): CHOL, HDL, LDLCALC, TRIG, CHOLHDL, LDLDIRECT in the last 72 hours. Thyroid Function Tests: No results for input(s): TSH, T4TOTAL, T3FREE, THYROIDAB in the last 72 hours.  Invalid input(s): FREET3  RADIOLOGY: Dg Shoulder Right  09/13/2014   CLINICAL DATA:  Fall this morning.  Right shoulder pain. Initial encounter.  EXAM: RIGHT SHOULDER - 2+ VIEW  COMPARISON:  MRI the right shoulder 09/21/2004  FINDINGS: The right shoulder is located. Mild degenerative changes are again noted. No acute fracture is present.  Calcified mediastinal lymph nodes are compatible with granulomatous disease.  IMPRESSION: 1. Degenerative changes the right shoulder without evidence for acute fracture or dislocation.   Electronically Signed   By: San Morelle M.D.   On: 09/13/2014 10:12   Ct Head Wo Contrast  09/13/2014   CLINICAL DATA:  Dizziness.  Recent fall.  Near syncope earlier today  EXAM: CT HEAD WITHOUT CONTRAST  TECHNIQUE: Contiguous axial images were obtained from the base of the skull through the vertex without intravenous contrast.  COMPARISON:  None.  FINDINGS: There is moderate diffuse atrophy. There is no intracranial mass, hemorrhage, extra-axial fluid collection, or midline shift. There is patchy small vessel disease in the centra semiovale bilaterally. Elsewhere gray-white compartments appear normal. No acute infarct apparent. The bony calvarium appears intact. The mastoid air cells are clear. There is a small right frontal scalp hematoma.  IMPRESSION: Atrophy with patchy periventricular small vessel disease. No intracranial mass, hemorrhage, or acute appearing infarct. No extra-axial fluid collection. Small right frontal scalp hematoma.   Electronically Signed   By: Gwyndolyn Saxon  Jasmine December III M.D.   On: 09/13/2014 10:36    PHYSICAL EXAM   patient is oriented to person time and place. Affect is normal. The bruise on the right side of his face is improving. Lungs are clear. Respiratory effort is nonlabored. Cardiac exam reveals S1 and S2. Abdomen is soft. There is no peripheral edema. His rhythm is regular and the rate is controlled.   TELEMETRY: I have reviewed telemetry today September 15, 2014. He has converted to sinus rhythm this morning.   ASSESSMENT AND PLAN:    Aortic  stenosis    He has mild to moderate aortic stenosis. This is not playing a major role with his symptoms at this time. No further workup.    Gait instability    He has long-term gait instability. It is not clear if this played any role with his current admission was syncope. However on this basis, I've chosen not to anticoagulate him yet for his atrial fibrillation.     Syncope    patient had a definite syncopal episode. This was different from the falling spells that he has related to his gait problem. He had newly diagnosed atrial fibrillation. He had significant swings in his heart rate while here. With a small amount of ambulation he had significant tachycardia. 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 Also his blood pressure is low in that setting including some orthostasis. He is now converted to sinus rhythm. Blood pressure is better. It is possible that his syncope is related to episodes of paroxysmal atrial fibrillation. In addition it is possible that he has prolonged pauses when he converts in and out of atrial fibrillation. I've chosen to stop his other antihypertensive meds. It will be safe for him if his pressure runs on the higher side for the next few weeks. It is important that he not become hypotensive again. I will send him home on a small dose of diltiazem. Hopefully this will help control rapid atrial fib. Hopefully it will not cause more marked bradycardia. He needs to have an event recorder in place very soon. He needs to have early follow-up with his primary cardiologist. Decisions will have to be made as to whether or not his bradycardia tachycardia syndrome is severe enough to warrant a pacemaker. I've chosen to recommend an event recorder as opposed to a LINQ implantable recorder at this time. However the [facility] may eventually be needed.    Paroxysmal atrial fibrillation      This admission is the first time that atrial fibrillation has  been documented. Certainly he will need anticoagulation if it is safe for him. He received one dose of Xarelto. I chose to not continue this at this time because of his gait instability and the lack of diagnosis for his syncope. Over time anticoagulation should be considered if it is felt to be safe for him.    Head trauma     The patient had head trauma with this syncopal episode. Fortunately it was not severe. He was not anticoagulated at the time of the fall.    Alteration in anticoagulation     I have noted above the thought process concerning anticoagulation in his case. It can be considered at a later date if and when it is safe for him.  Dola Argyle 09/15/2014 8:46 AM

## 2014-09-16 ENCOUNTER — Encounter: Payer: Self-pay | Admitting: *Deleted

## 2014-09-16 ENCOUNTER — Telehealth: Payer: Self-pay | Admitting: *Deleted

## 2014-09-16 NOTE — Telephone Encounter (Signed)
Transition Care Management Follow-up Telephone Call  How have you been since you were released from the hospital? No- denies shortness of breath, dizziness, syncope, palpitations   Do you understand why you were in the hospital? YES    Do you understand the discharge instrcutions? YES   Items Reviewed:  Medications reviewed: YES  Allergies reviewed: YES   Dietary changes reviewed: YES- Heart Healthy Diet   Referrals reviewed: YES    Functional Questionnaire:  Activities of Daily Living (ADLs):   He states they are independent in the following: ambulation, bathing, dressing  States they require assistance with the following: driving (wife)   Any transportation issues/concerns?: NO- wife driving    Any patient concerns? NO   Confirmed importance and date/time of follow-up visits scheduled: YES- follow-up scheduled 09/26/14 with Dr. Aundra Dubin.  Offered follow-up with Dr. Charlett Blake, but patient declined.  OK per Dr. Charlett Blake.  Patient scheduled physica.     Confirmed with patient if condition begins to worsen call PCP or go to the ER.  Patient was given the Call-a-Nurse line [phone]: YES

## 2014-09-17 ENCOUNTER — Encounter (INDEPENDENT_AMBULATORY_CARE_PROVIDER_SITE_OTHER): Payer: PPO

## 2014-09-17 ENCOUNTER — Encounter: Payer: Self-pay | Admitting: Radiology

## 2014-09-17 DIAGNOSIS — R55 Syncope and collapse: Secondary | ICD-10-CM

## 2014-09-17 NOTE — Progress Notes (Signed)
Patient ID: Justin [name], male   DOB: 08/01/1928, 81 y.o.   MRN: [id number] [employer] 30 day monitor applied. EOS 10-17-14

## 2014-09-20 ENCOUNTER — Telehealth: Payer: Self-pay | Admitting: Cardiology

## 2014-09-20 ENCOUNTER — Telehealth: Payer: Self-pay | Admitting: *Deleted

## 2014-09-20 MED ORDER — HYDROCHLOROTHIAZIDE 12.5 MG PO CAPS: 12.5000 mg | ORAL_CAPSULE | Freq: Every day | ORAL | Status: DC

## 2014-09-20 NOTE — Telephone Encounter (Signed)
Patient having BP elevations since starting new medications of Cardizem and stopping Atenolol and Lisinopril. He has noted that now his ankles are swollen (mild to moderate). This is first time he has had ankle swelling. Denies headaches/dizziness/SOB. States he typically runs 972-820'U systolically.  Dr. Ron Parker reviewed and ordered HCTZ 12.5 mg by mouth daily. Notified patient of medication addition to his course of therapy. Educated patient on medication. Patient verbalized understanding and agreement. Patient provided pharmacy preference. Order completed.

## 2014-09-20 NOTE — Telephone Encounter (Signed)
Updated med rec to indicate changes made at discharge from hospital on 09/15/14.

## 2014-09-20 NOTE — Telephone Encounter (Signed)
New message     Pt c/o BP issue: STAT if pt c/o blurred vision, one-sided weakness or slurred speech  1. What are your last 5 BP readings? 181/99, 168/96, 174/93  2. Are you having any other symptoms (ex. Dizziness, headache, blurred vision, passed out)? Ankles swollen last night----no other symptoms 3. What is your BP issue?  Took new medication starting last Sunday (cardizem)---stopped atenolol and lisinopril.

## 2014-09-26 ENCOUNTER — Encounter: Payer: Self-pay | Admitting: Cardiology

## 2014-09-26 ENCOUNTER — Telehealth: Payer: Self-pay | Admitting: *Deleted

## 2014-09-26 ENCOUNTER — Ambulatory Visit (INDEPENDENT_AMBULATORY_CARE_PROVIDER_SITE_OTHER): Payer: PPO | Admitting: Cardiology

## 2014-09-26 ENCOUNTER — Encounter: Payer: Self-pay | Admitting: *Deleted

## 2014-09-26 VITALS — BP 120/62 | HR 84 | Ht 65.0 in | Wt 149.0 lb

## 2014-09-26 DIAGNOSIS — I739 Peripheral vascular disease, unspecified: Secondary | ICD-10-CM

## 2014-09-26 DIAGNOSIS — I350 Nonrheumatic aortic (valve) stenosis: Secondary | ICD-10-CM

## 2014-09-26 DIAGNOSIS — I480 Paroxysmal atrial fibrillation: Secondary | ICD-10-CM | POA: Diagnosis not present

## 2014-09-26 DIAGNOSIS — R55 Syncope and collapse: Secondary | ICD-10-CM | POA: Diagnosis not present

## 2014-09-26 DIAGNOSIS — E785 Hyperlipidemia, unspecified: Secondary | ICD-10-CM

## 2014-09-26 DIAGNOSIS — I10 Essential (primary) hypertension: Secondary | ICD-10-CM | POA: Diagnosis not present

## 2014-09-26 DIAGNOSIS — I2510 Atherosclerotic heart disease of native coronary artery without angina pectoris: Secondary | ICD-10-CM

## 2014-09-26 MED ORDER — APIXABAN 5 MG PO TABS: 5.0000 mg | ORAL_TABLET | Freq: Two times a day (BID) | ORAL | Status: DC

## 2014-09-26 NOTE — Progress Notes (Signed)
Patient ID: Justin [name], male   DOB: [birth year], 81 y.o.   MRN: [id number] PCP: Dr. Charlett Blake  81 yo with history of CAD s/p CABG presents for cardiology followup.  Since the last time I saw him, he had an episode of syncope on 09/13/14.  He had not eaten breakfast that morning and went to serve at [employer].  After finishing, he felt weak and sat down.  He then stood up, got lightheaded, and lost consciousness for a few seconds.  He was taken to the ER and was found to have atrial fibrillation.  He had not felt palpitations.  Syncope was thought to be due to orthostasis in the setting of dehydration and possibly the atrial fibrillation.  However, there was some concern that he could have have tachy-brady syndrome and could have passed out due to bradycardia.  He is currently wearing and event monitor.  He was initially started on Xarelto in the hospital.  This was then stopped due to concern that he could be having bradycardic events and may pass out again.   Since that time, he has not had any episodes of loss of consciousness or lightheadedness.  He has no chest pain and no exertional dyspnea.  He can walk 1/2 mile at a good pace without shortness of breath.  He continues to feel that his legs get weak easily.  No claudication.    ECG: NSR, left axis deviation, nonspecific anterolateral T wave flattening  Labs (3/14): LDL 82, HDL 68, K 5.2, creatinine 1.06 Labs (5/14): TSH normal Labs (8/15): LDL 73 Labs (3/16): K 4.6, creatinine 1.23, HCT 37.4  PMH: 1. HTN 2. Hyperlipidemia 3. BPH 4. H/o sigmoid diverticulitis 5. CAD: s/p CABG x 5 in 2001.   6. Aortic stenosis: Echo (3/14) with EF 55%, AVA 1.5 cm^2 with mean gradient 14 mmHg (mild range). Echo (3/16) with EF 55-60%, mild MR, mild-moderate aortic stenosis with mean gradient 19 mmHg, AVA 1.1 cm^2.  7. Imbalance: ?polyneuropathy.  8. PAD: Peripheral arterial dopplers and aorto-iliac exam in 12/14 showed < 50% diffuse bilateral SFA disease and  calcified plaque in the iliac system but no significant stenosis.  9. Atrial fibrillation: Paroxysmal, 1st documented episode in 3/16.  10. Syncope: 3/16.   SH: Married, prior smoker, lives in [place], retired  FH: Father with CAD  ROS: All systems reviewed and negative except as per HPI.   Current Outpatient Prescriptions  Medication Sig Dispense Refill  . acetaminophen (TYLENOL) 325 MG tablet Take 650 mg by mouth every 6 (six) hours as needed for mild pain or moderate pain.    . cholecalciferol (VITAMIN D) 1000 UNITS tablet Take 1,000 Units by mouth daily as needed.     . diltiazem (CARDIZEM CD) 120 MG 24 hr capsule Take 1 capsule (120 mg total) by mouth daily. 30 capsule 6  . diphenhydramine-acetaminophen (TYLENOL PM) 25-500 MG TABS Take 1 tablet by mouth at bedtime as needed (insomnia).    . finasteride (PROSCAR) 5 MG tablet Take 1 tablet (5 mg total) by mouth daily. 90 tablet 1  . hydrochlorothiazide (MICROZIDE) 12.5 MG capsule Take 1 capsule (12.5 mg total) by mouth daily. 90 capsule 3  . lovastatin (MEVACOR) 20 MG tablet Take 1 tablet (20 mg total) by mouth at bedtime. 90 tablet 1  . MELATONIN PO Take 1 tablet by mouth at bedtime as needed.     . Multiple Vitamin (MULTIVITAMIN) capsule Take 1 capsule by mouth every other day.     [name]  multivitamin-lutein (OCUVITE-LUTEIN) CAPS Take 2 capsules by mouth daily.     . Probiotic Product (PROBIOTIC DAILY PO) Take 1 capsule by mouth daily.    [name] apixaban (ELIQUIS) 5 MG TABS tablet Take 1 tablet (5 mg total) by mouth 2 (two) times daily. 60 tablet 1   No current facility-administered medications for this visit.    BP 120/62 mmHg  Pulse 84  Ht 5\' 5"  (1.651 m)  Wt 149 lb (67.586 kg)  BMI 24.79 kg/m2 General: NAD Neck: No JVD, no thyromegaly or thyroid nodule.  Lungs: Clear to auscultation bilaterally with normal respiratory effort. CV: Nondisplaced PMI.  Heart regular S1/S2, no S3/S4, 2/6 SEM RUSB.  No peripheral edema.  No carotid bruit.   Feet are warm but I am unable to palpate PT pulses.  Abdomen: Soft, nontender, no hepatosplenomegaly, no distention.  Skin: Intact without lesions or rashes.  Neurologic: Alert and oriented x 3.  Psych: Normal affect. Extremities: No clubbing or cyanosis.   Assessment/Plan: 1. CAD: s/p CABG.  No chest pain.  Continue ASA 81 and statin.    2. Aortic stenosis: Mild to moderate on 3/16 echo.  Repeat echo in 1-2 years depending on symptoms.  3. Leg weakness: Patient had PAD by doppler exam in 12/14 but appeared nonobstructive.  He does not have claudication but continues to feel that his legs tire easily.  I am unable to palpate PT pulses.  I will arrange for peripheral arterial dopplers to followup.   4. Hyperlipidemia: Good LDL in 8/15.   5. HTN: BP controlled.   6. Atrial fibrillation: Paroxysmal.  He is in NSR today.  He does not feel palpitations.   - Continue diltiazem CD. - No further lightheadedness or syncope.  I will have him stop ASA 81 and start Eliquis 5 mg bid.  - He is wearing a 30 day event monitor.  7. Syncope: Possibly due to dehydration and possibly a vagal event in the setting of atrial fibrillation.  However, cannot rule out bradycardic event (tachy-brady syndrome).  He is wearing a 30 day monitor to look for bradycardic events as well as to quantify atrial fibrillation burden.   Loralie Champagne 09/26/2014

## 2014-09-26 NOTE — Telephone Encounter (Signed)
Received a serious notification from [place] in regards to the pt being in paroxymal afib at 8:50 pm last night.  Pt saw his primary Cardiologist today, Dr Aundra Dubin and critical monitor given to him for review.  Dr Aundra Dubin and nurse to review, sign, and have this scanned.

## 2014-09-26 NOTE — Patient Instructions (Signed)
Stop aspirin.   Start Eliquis 5mg  two times a day.   Your physician has requested that you have a lower extremity arterial duplex. This test is an ultrasound of the arteries in the legs. It looks at arterial blood flow in the legs and arms. Allow one hour for Lower Arterial scans. There are no restrictions or special instructions   Schedule an appointment in the [facility] for 1 month.   Your physician recommends that you schedule a follow-up appointment in: 1 month with Dr Rejeana Brock can be the same day as the appt in the Anticoagulation Clinic.

## 2014-09-30 ENCOUNTER — Other Ambulatory Visit: Payer: Self-pay

## 2014-09-30 ENCOUNTER — Telehealth: Payer: Self-pay | Admitting: Cardiology

## 2014-09-30 MED ORDER — LOVASTATIN 20 MG PO TABS: 20.0000 mg | ORAL_TABLET | Freq: Every day | ORAL | Status: DC

## 2014-09-30 NOTE — Telephone Encounter (Signed)
Gildardo Cranker at [facility] confirming that it is [facility] for pt to take lovastatin and diltiazem together.  Pt had been on lovastatin but diltiazem was prescribed at discharge from hospital the end of March 2016. Gildardo Cranker states diltiazem can increase the effects of lovastatin, she thinks it is a statin class effect.  Gildardo Cranker advised I will forward to Dr Aundra Dubin for review.

## 2014-09-30 NOTE — Telephone Encounter (Signed)
Ask Gay Filler about this.  If ok, can continue, if not switch to atorvastatin 10 mg daily.

## 2014-09-30 NOTE — Telephone Encounter (Signed)
Gay Filler gone all week, no pharmacist here.

## 2014-09-30 NOTE — Telephone Encounter (Signed)
New Message  Rep from [facility] wanted to speak w/ Rn about a drug interaction involving the pt. Please call back and dsicuss.

## 2014-10-01 NOTE — Telephone Encounter (Signed)
Justin [name] advised Ok to take lovastatin and diltiazem, monitor for myalgias. Justin [name] advised can change lovastatin to atorvastatin 10mg  if pt develops myalgias.

## 2014-10-01 NOTE — Telephone Encounter (Signed)
To Dr Naoma Diener, pharmacist says that you can take diltiazem and lovastatin together just need to watch out for myalgias, diltiazem can increase the effects of lovastatin.

## 2014-10-01 NOTE — Telephone Encounter (Signed)
Follow up      Calling to check on the status on the drug interaction between lovastatin and diltiazem.  Please call

## 2014-10-04 ENCOUNTER — Telehealth: Payer: Self-pay | Admitting: Cardiology

## 2014-10-04 NOTE — Telephone Encounter (Signed)
[facility] calling to alert to a possible drug interaction between Lovastatin and Cardizem , pls call [place]

## 2014-10-04 NOTE — Telephone Encounter (Signed)
Call from pharmacy regarding drug interaction.  Pt has not started taking as Pharm has not filled as of yet.  Spoke with [facility] rep. made aware okay to fill and pt to let us know if experiences any myalgias.    Call pt, spoke with wife, made her aware for pt to call us if experiences any cramping as this is a possible side effect of the mediation and we can make changes if needed.  She agreed to share with pt and let our office know if there are any questions and problems.

## 2014-10-16 ENCOUNTER — Ambulatory Visit (HOSPITAL_COMMUNITY): Payer: PPO | Attending: Cardiology | Admitting: Cardiology

## 2014-10-16 DIAGNOSIS — I480 Paroxysmal atrial fibrillation: Secondary | ICD-10-CM | POA: Insufficient documentation

## 2014-10-16 DIAGNOSIS — I739 Peripheral vascular disease, unspecified: Secondary | ICD-10-CM | POA: Insufficient documentation

## 2014-10-16 NOTE — Progress Notes (Signed)
ABI performed  

## 2014-10-24 ENCOUNTER — Telehealth: Payer: Self-pay | Admitting: *Deleted

## 2014-10-24 NOTE — Telephone Encounter (Signed)
Dr Aundra Dubin reviewed monitor done 09/17/14-Dr Aundra Dubin signed report and returned to monitor room.

## 2014-11-14 ENCOUNTER — Encounter: Payer: Self-pay | Admitting: Cardiology

## 2014-11-14 ENCOUNTER — Encounter: Payer: PPO | Admitting: *Deleted

## 2014-11-14 ENCOUNTER — Ambulatory Visit (INDEPENDENT_AMBULATORY_CARE_PROVIDER_SITE_OTHER): Payer: PPO | Admitting: Cardiology

## 2014-11-14 VITALS — BP 128/82 | HR 66 | Ht 65.0 in | Wt 149.0 lb

## 2014-11-14 DIAGNOSIS — I480 Paroxysmal atrial fibrillation: Secondary | ICD-10-CM

## 2014-11-14 DIAGNOSIS — I25810 Atherosclerosis of coronary artery bypass graft(s) without angina pectoris: Secondary | ICD-10-CM

## 2014-11-14 DIAGNOSIS — E785 Hyperlipidemia, unspecified: Secondary | ICD-10-CM | POA: Diagnosis not present

## 2014-11-14 DIAGNOSIS — I350 Nonrheumatic aortic (valve) stenosis: Secondary | ICD-10-CM | POA: Diagnosis not present

## 2014-11-14 LAB — BASIC METABOLIC PANEL
BUN: 18 mg/dL (ref 6–23)
CHLORIDE: 98 meq/L (ref 96–112)
CO2: 28 mEq/L (ref 19–32)
Calcium: 9.5 mg/dL (ref 8.4–10.5)
Creatinine, Ser: 0.95 mg/dL (ref 0.40–1.50)
GFR: 80.23 mL/min (ref >60.00)
GLUCOSE: 98 mg/dL (ref 70–99)
POTASSIUM: 3.9 meq/L (ref 3.5–5.1)
SODIUM: 133 meq/L — AB (ref 135–145)

## 2014-11-14 LAB — CBC WITH DIFFERENTIAL/PLATELET
BASOS PCT: 0.6 % (ref 0.0–3.0)
Basophils Absolute: 0 10*3/uL (ref 0.0–0.1)
EOS ABS: 0.2 10*3/uL (ref 0.0–0.7)
Eosinophils Relative: 2.2 % (ref 0.0–5.0)
HCT: 39.7 % (ref 39.0–52.0)
Hemoglobin: 13.4 g/dL (ref 13.0–17.0)
LYMPHS ABS: 1.9 10*3/uL (ref 0.7–4.0)
LYMPHS PCT: 21.3 % (ref 12.0–46.0)
MCHC: 33.7 g/dL (ref 30.0–36.0)
MCV: 84 fl (ref 78.0–100.0)
Monocytes Absolute: 0.7 10*3/uL (ref 0.1–1.0)
Monocytes Relative: 8.6 % (ref 3.0–12.0)
NEUTROS ABS: 5.9 10*3/uL (ref 1.4–7.7)
Neutrophils Relative %: 67.3 % (ref 43.0–77.0)
PLATELETS: 373 10*3/uL (ref 150.0–400.0)
RBC: 4.72 Mil/uL (ref 4.22–5.81)
RDW: 15.1 % (ref 11.5–15.5)
WBC: 8.7 10*3/uL (ref 4.0–10.5)

## 2014-11-14 NOTE — Patient Instructions (Signed)
Medication Instructions:  No changes today.  Labwork: BMET/CBCd today  Testing/Procedures: None today  Follow-Up: Your physician wants you to follow-up in: 6 months with Dr Aundra Dubin. (November 2016). You will receive a reminder letter in the mail two months in advance. If you don't receive a letter, please call our office to schedule the follow-up appointment.

## 2014-11-14 NOTE — Progress Notes (Signed)
Patient ID: Justin [name], male   DOB: July 08, 1928, 81 y.o.   MRN: [id number] PCP: Dr. Charlett Blake  81 yo with history of CAD s/p CABG presents for cardiology followup.  Since the last time I saw him, he had an episode of syncope on 09/13/14.  He had not eaten breakfast that morning and went to serve at [employer].  After finishing, he felt weak and sat down.  He then stood up, got lightheaded, and lost consciousness for a few seconds.  He was taken to the ER and was found to have atrial fibrillation.  He had not felt palpitations.  Syncope was thought to be due to orthostasis in the setting of dehydration and possibly the atrial fibrillation.  However, there was some concern that he could have have tachy-brady syndrome and could have passed out due to bradycardia.  He wore an event monitor in 3/16 that showed predominant NSR with one run of atrial fibrillation.  He is now on apixaban.   Since that time, he has not had any episodes of loss of consciousness or lightheadedness.  He has no chest pain and no exertional dyspnea.  He can walk 1/2 mile at a good pace without shortness of breath.  He continues to feel that his legs get weak easily.  No claudication.  Lower extremity dopplers in 4/16 were similar to prior study, no evidence for tight stenosis.   Labs (3/14): LDL 82, HDL 68, K 5.2, creatinine 1.06 Labs (5/14): TSH normal Labs (8/15): LDL 73 Labs (3/16): K 4.6, creatinine 1.23, HCT 37.4  PMH: 1. HTN 2. Hyperlipidemia 3. BPH 4. H/o sigmoid diverticulitis 5. CAD: s/p CABG x 5 in 2001.   6. Aortic stenosis: Echo (3/14) with EF 55%, AVA 1.5 cm^2 with mean gradient 14 mmHg (mild range). Echo (3/16) with EF 55-60%, mild MR, mild-moderate aortic stenosis with mean gradient 19 mmHg, AVA 1.1 cm^2.  7. Imbalance: ?polyneuropathy.  8. PAD: Peripheral arterial dopplers and aorto-iliac exam in 12/14 showed < 50% diffuse bilateral SFA disease and calcified plaque in the iliac system but no significant  stenosis.  Lower extremity arterial dopplers in 4/16 showed noncompressible right ABI, left ABI 1.1, TBIs stable => no significant change from 12/14.   9. Atrial fibrillation: Paroxysmal, 1st documented episode in 3/16.  Event monitor in 3/16 showed predominant NSR with one run of atrial fibrillation.  10. Syncope: 3/16.   SH: Married, prior smoker, lives in [place], retired  FH: Father with CAD  ROS: All systems reviewed and negative except as per HPI.   Current Outpatient Prescriptions  Medication Sig Dispense Refill  . acetaminophen (TYLENOL) 325 MG tablet Take 650 mg by mouth every 6 (six) hours as needed for mild pain or moderate pain.    [name] apixaban (ELIQUIS) 5 MG TABS tablet Take 1 tablet (5 mg total) by mouth 2 (two) times daily. 60 tablet 1  . cholecalciferol (VITAMIN D) 1000 UNITS tablet Take 1,000 Units by mouth daily as needed.     . diltiazem (CARDIZEM CD) 120 MG 24 hr capsule Take 1 capsule (120 mg total) by mouth daily. 30 capsule 6  . diphenhydramine-acetaminophen (TYLENOL PM) 25-500 MG TABS Take 1 tablet by mouth at bedtime as needed (insomnia).    . finasteride (PROSCAR) 5 MG tablet Take 1 tablet (5 mg total) by mouth daily. 90 tablet 1  . hydrochlorothiazide (MICROZIDE) 12.5 MG capsule Take 1 capsule (12.5 mg total) by mouth daily. 90 capsule 3  . lovastatin (MEVACOR)  20 MG tablet Take 1 tablet (20 mg total) by mouth at bedtime. 90 tablet 1  . MELATONIN PO Take 1 tablet by mouth at bedtime as needed.     . Multiple Vitamin (MULTIVITAMIN) capsule Take 1 capsule by mouth every other day.     . multivitamin-lutein (OCUVITE-LUTEIN) CAPS Take 2 capsules by mouth daily.     . Probiotic Product (PROBIOTIC DAILY PO) Take 1 capsule by mouth daily.     No current facility-administered medications for this visit.    BP 128/82 mmHg  Pulse 66  Ht 5\' 5"  (1.651 m)  Wt 149 lb (67.586 kg)  BMI 24.79 kg/m2  SpO2 98% General: NAD Neck: No JVD, no thyromegaly or thyroid nodule.   Lungs: Clear to auscultation bilaterally with normal respiratory effort. CV: Nondisplaced PMI.  Heart regular S1/S2, no S3/S4, 2/6 SEM RUSB.  Trace ankle edema bilaterally.  No carotid bruit.  Feet are warm but I am unable to palpate PT pulses.  Abdomen: Soft, nontender, no hepatosplenomegaly, no distention.  Skin: Intact without lesions or rashes.  Neurologic: Alert and oriented x 3.  Psych: Normal affect. Extremities: No clubbing or cyanosis.   Assessment/Plan: 1. CAD: s/p CABG.  No chest pain.  Continue ASA 81 and statin.    2. Aortic stenosis: Mild to moderate on 3/16 echo.  Repeat echo in 1-2 years depending on symptoms.  3. Leg weakness: Patient had PAD by doppler exam in 12/14 but appeared nonobstructive.  He does not have claudication but continues to feel that his legs tire easily.  Repeat peripheral arterial doppler study in 4/16 was similar to the 12/14 study. 4. Hyperlipidemia: Good LDL in 8/15.   5. HTN: BP controlled.   6. Atrial fibrillation: Paroxysmal.  He is in NSR today.  He does not feel palpitations.   - Continue diltiazem CD. - No further lightheadedness or syncope.  Continue Eliquis 5 mg bid.  Check CBC and BMET today.  7. Syncope: Possibly due to dehydration and possibly a vagal event in the setting of atrial fibrillation.  30 day monitor did not show any culprit events (just a short atrial fibrillation run).    Loralie Champagne 11/14/2014

## 2014-11-18 ENCOUNTER — Other Ambulatory Visit: Payer: Self-pay | Admitting: *Deleted

## 2014-11-18 DIAGNOSIS — I739 Peripheral vascular disease, unspecified: Secondary | ICD-10-CM

## 2014-11-18 DIAGNOSIS — I480 Paroxysmal atrial fibrillation: Secondary | ICD-10-CM

## 2014-11-18 MED ORDER — APIXABAN 5 MG PO TABS: 5.0000 mg | ORAL_TABLET | Freq: Two times a day (BID) | ORAL | Status: DC

## 2014-12-09 ENCOUNTER — Telehealth: Payer: Self-pay | Admitting: Medical

## 2014-12-09 NOTE — Telephone Encounter (Signed)
Caller name: [place] Relation to pt: Self Call back number: [phone] Pharmacy:  Reason for call: Pt called wanting to know the name of Dermatologist that he was referral to last year from [employer] [facility]. Please advise.

## 2014-12-09 NOTE — Telephone Encounter (Signed)
Called patient with information requested.

## 2014-12-23 ENCOUNTER — Telehealth: Payer: Self-pay | Admitting: Cardiology

## 2014-12-23 DIAGNOSIS — I480 Paroxysmal atrial fibrillation: Secondary | ICD-10-CM

## 2014-12-23 NOTE — Telephone Encounter (Signed)
New message     Pt c/o medication issue:  1. Name of Medication: Eliquis  2. How are you currently taking this medication (dosage and times per day)? 5mg  Twice a day  3. Are you having a reaction (difficulty breathing--STAT)? No  4. What is your medication issue? Bleeding more frequent when get a scratch, bloody nose and blood in urine  Please advise

## 2014-12-23 NOTE — Telephone Encounter (Signed)
Pt advised, verbalized understanding, BMET scheduled for 12/25/14

## 2014-12-23 NOTE — Telephone Encounter (Signed)
With nose bleeds, hold Eliquis for a couple of days then restart.  Get BMET to make sure creatinine is not higher.

## 2014-12-23 NOTE — Telephone Encounter (Signed)
Pt states he has had a number of nose bleeds in the last couple of weeks, he does not have a history of nose bleeds. Pt states this happens during the night and he notices blood on his pillow, less than the size of a quarter.  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  Pt states he is mostly concerned becuase he noticed a couple a drops of BRB when he started urinating on Saturday, he did not notice any BRB yesterday when urinating, he did notice several drops of BRB when he began to urinate today. Pt denies any S/S of UTI. Pt states he is concerned that his blood may be too thin.

## 2014-12-23 NOTE — Telephone Encounter (Signed)
Pt states he takes Eliquis 5mg  bid.  Pt advised I will forward to Dr Aundra Dubin for review.

## 2014-12-25 ENCOUNTER — Other Ambulatory Visit (INDEPENDENT_AMBULATORY_CARE_PROVIDER_SITE_OTHER): Payer: PPO | Admitting: *Deleted

## 2014-12-25 DIAGNOSIS — I480 Paroxysmal atrial fibrillation: Secondary | ICD-10-CM | POA: Diagnosis not present

## 2014-12-25 LAB — BASIC METABOLIC PANEL
BUN: 15 mg/dL (ref 6–23)
CALCIUM: 9.3 mg/dL (ref 8.4–10.5)
CO2: 28 meq/L (ref 19–32)
Chloride: 100 mEq/L (ref 96–112)
Creatinine, Ser: 1.02 mg/dL (ref 0.40–1.50)
GFR: 73.89 mL/min (ref >60.00)
GLUCOSE: 88 mg/dL (ref 70–99)
Potassium: 4.1 mEq/L (ref 3.5–5.1)
Sodium: 137 mEq/L (ref 135–145)

## 2014-12-27 ENCOUNTER — Telehealth: Payer: Self-pay | Admitting: Family Medicine

## 2014-12-27 NOTE — Telephone Encounter (Signed)
Caller name:Virgina Jallow  Relation to pt: spouse  Call back number: [phone]   Reason for call:  Pt would like to schedule nurse visit only for phenomena injection requesting orders.

## 2014-12-27 NOTE — Telephone Encounter (Signed)
They do not need orders for this.  They can schedule a nurse visit appt.

## 2014-12-27 NOTE — Telephone Encounter (Signed)
LVM advising pt to schedule nurse appointment

## 2015-01-01 ENCOUNTER — Telehealth: Payer: Self-pay | Admitting: Cardiology

## 2015-01-01 ENCOUNTER — Ambulatory Visit (INDEPENDENT_AMBULATORY_CARE_PROVIDER_SITE_OTHER): Payer: PPO | Admitting: *Deleted

## 2015-01-01 DIAGNOSIS — Z23 Encounter for immunization: Secondary | ICD-10-CM

## 2015-01-01 NOTE — Telephone Encounter (Signed)
New message      Pt c/o medication issue:  1. Name of Medication: eliquis 2. How are you currently taking this medication (dosage and times per day)? 5mg   bid 3. Are you having a reaction (difficulty breathing--STAT)? no  4. What is your medication issue? Pt stopped eliquis 8 days ago because of blood in urine.  Urine is now clear.  Should he restart eliquis?  OK to leave message with wife

## 2015-01-01 NOTE — Progress Notes (Signed)
Pre visit review using our clinic review tool, if applicable. No additional management support is needed unless otherwise documented below in the visit note.  Patient tolerated injection well.  

## 2015-01-01 NOTE — Telephone Encounter (Signed)
Spoke with pt and informed him it was ok to restart Eliquis. Informed pt to let us know if bleeding occurred again. Pt verbalized understanding and was in agreement with this plan.

## 2015-01-06 ENCOUNTER — Encounter: Payer: Self-pay | Admitting: Family Medicine

## 2015-02-12 ENCOUNTER — Telehealth: Payer: Self-pay | Admitting: Family Medicine

## 2015-02-12 NOTE — Telephone Encounter (Signed)
pre visit letter mailed 02/07/15

## 2015-02-20 ENCOUNTER — Encounter: Payer: PPO | Admitting: Family Medicine

## 2015-02-28 ENCOUNTER — Ambulatory Visit (INDEPENDENT_AMBULATORY_CARE_PROVIDER_SITE_OTHER): Payer: PPO | Admitting: Family Medicine

## 2015-02-28 ENCOUNTER — Encounter: Payer: Self-pay | Admitting: Family Medicine

## 2015-02-28 VITALS — BP 143/81 | HR 70 | Temp 98.1°F | Ht 65.0 in | Wt 146.4 lb

## 2015-02-28 DIAGNOSIS — Z23 Encounter for immunization: Secondary | ICD-10-CM

## 2015-02-28 DIAGNOSIS — E785 Hyperlipidemia, unspecified: Secondary | ICD-10-CM

## 2015-02-28 DIAGNOSIS — Z0000 Encounter for general adult medical examination without abnormal findings: Secondary | ICD-10-CM | POA: Diagnosis not present

## 2015-02-28 DIAGNOSIS — I10 Essential (primary) hypertension: Secondary | ICD-10-CM

## 2015-02-28 DIAGNOSIS — R739 Hyperglycemia, unspecified: Secondary | ICD-10-CM | POA: Diagnosis not present

## 2015-02-28 DIAGNOSIS — I350 Nonrheumatic aortic (valve) stenosis: Secondary | ICD-10-CM

## 2015-02-28 LAB — TSH: TSH: 3.815 u[IU]/mL (ref 0.350–4.500)

## 2015-02-28 NOTE — Progress Notes (Signed)
Pre visit review using our clinic review tool, if applicable. No additional management support is needed unless otherwise documented below in the visit note. 

## 2015-02-28 NOTE — Patient Instructions (Signed)
Bring copy of Health care power of attorney and living will  Preventive Care for Adults A healthy lifestyle and preventive care can promote health and wellness. Preventive health guidelines for men include the following key practices:  A routine yearly physical is a good way to check with your health care provider about your health and preventative screening. It is a chance to share any concerns and updates on your health and to receive a thorough exam.  Visit your dentist for a routine exam and preventative care every 6 months. Brush your teeth twice a day and floss once a day. Good oral hygiene prevents tooth decay and gum disease.  The frequency of eye exams is based on your age, health, family medical history, use of contact lenses, and other factors. Follow your health care provider's recommendations for frequency of eye exams.  Eat a healthy diet. Foods such as vegetables, fruits, whole grains, low-fat dairy products, and lean protein foods contain the nutrients you need without too many calories. Decrease your intake of foods high in solid fats, added sugars, and salt. Eat the right amount of calories for you.Get information about a proper diet from your health care provider, if necessary.  Regular physical exercise is one of the most important things you can do for your health. Most adults should get at least 150 minutes of moderate-intensity exercise (any activity that increases your heart rate and causes you to sweat) each week. In addition, most adults need muscle-strengthening exercises on 2 or more days a week.  Maintain a healthy weight. The body mass index (BMI) is a screening tool to identify possible weight problems. It provides an estimate of body fat based on height and weight. Your health care provider can find your BMI and can help you achieve or maintain a healthy weight.For adults 20 years and older:  A BMI below 18.5 is considered underweight.  A BMI of 18.5 to 24.9 is  normal.  A BMI of 25 to 29.9 is considered overweight.  A BMI of 30 and above is considered obese.  Maintain normal blood lipids and cholesterol levels by exercising and minimizing your intake of saturated fat. Eat a balanced diet with plenty of fruit and vegetables. Blood tests for lipids and cholesterol should begin at age 22 and be repeated every 5 years. If your lipid or cholesterol levels are high, you are over 50, or you are at high risk for heart disease, you may need your cholesterol levels checked more frequently.Ongoing high lipid and cholesterol levels should be treated with medicines if diet and exercise are not working.  If you smoke, find out from your health care provider how to quit. If you do not use tobacco, do not start.  Lung cancer screening is recommended for adults aged 40-80 years who are at high risk for developing lung cancer because of a history of smoking. A yearly low-dose CT scan of the lungs is recommended for people who have at least a 30-pack-year history of smoking and are a current smoker or have quit within the past 15 years. A pack year of smoking is smoking an average of 1 pack of cigarettes a day for 1 year (for example: 1 pack a day for 30 years or 2 packs a day for 15 years). Yearly screening should continue until the smoker has stopped smoking for at least 15 years. Yearly screening should be stopped for people who develop a health problem that would prevent them from having lung cancer treatment.  If you choose to drink alcohol, do not have more than 2 drinks per day. One drink is considered to be 12 ounces (355 mL) of beer, 5 ounces (148 mL) of wine, or 1.5 ounces (44 mL) of liquor.  Avoid use of street drugs. Do not share needles with anyone. Ask for help if you need support or instructions about stopping the use of drugs.  High blood pressure causes heart disease and increases the risk of stroke. Your blood pressure should be checked at least every 1-2  years. Ongoing high blood pressure should be treated with medicines, if weight loss and exercise are not effective.  If you are 20-35 years old, ask your health care provider if you should take aspirin to prevent heart disease.  Diabetes screening involves taking a blood sample to check your fasting blood sugar level. This should be done once every 3 years, after age 78, if you are within normal weight and without risk factors for diabetes. Testing should be considered at a younger age or be carried out more frequently if you are overweight and have at least 1 risk factor for diabetes.  Colorectal cancer can be detected and often prevented. Most routine colorectal cancer screening begins at the age of 4 and continues through age 60. However, your health care provider may recommend screening at an earlier age if you have risk factors for colon cancer. On a yearly basis, your health care provider may provide home test kits to check for hidden blood in the stool. Use of a small camera at the end of a tube to directly examine the colon (sigmoidoscopy or colonoscopy) can detect the earliest forms of colorectal cancer. Talk to your health care provider about this at age 36, when routine screening begins. Direct exam of the colon should be repeated every 5-10 years through age 55, unless early forms of precancerous polyps or small growths are found.  People who are at an increased risk for hepatitis B should be screened for this virus. You are considered at high risk for hepatitis B if:  You were born in a country where hepatitis B occurs often. Talk with your health care provider about which countries are considered high risk.  Your parents were born in a high-risk country and you have not received a shot to protect against hepatitis B (hepatitis B vaccine).  You have HIV or AIDS.  You use needles to inject street drugs.  You live with, or have sex with, someone who has hepatitis B.  You are a man who  has sex with other men (MSM).  You get hemodialysis treatment.  You take certain medicines for conditions such as cancer, organ transplantation, and autoimmune conditions.  Hepatitis C blood testing is recommended for all people born from [birth year] through 1965 and any individual with known risks for hepatitis C.  Practice safe sex. Use condoms and avoid high-risk sexual practices to reduce the spread of sexually transmitted infections (STIs). STIs include gonorrhea, chlamydia, syphilis, trichomonas, herpes, HPV, and human immunodeficiency virus (HIV). Herpes, HIV, and HPV are viral illnesses that have no cure. They can result in disability, cancer, and death.  If you are at risk of being infected with HIV, it is recommended that you take a prescription medicine daily to prevent HIV infection. This is called preexposure prophylaxis (PrEP). You are considered at risk if:  You are a man who has sex with other men (MSM) and have other risk factors.  You are a heterosexual man,  are sexually active, and are at increased risk for HIV infection.  You take drugs by injection.  You are sexually active with a partner who has HIV.  Talk with your health care provider about whether you are at high risk of being infected with HIV. If you choose to begin PrEP, you should first be tested for HIV. You should then be tested every 3 months for as long as you are taking PrEP.  A one-time screening for abdominal aortic aneurysm (AAA) and surgical repair of large AAAs by ultrasound are recommended for men ages 85 to 72 years who are current or former smokers.  Healthy men should no longer receive prostate-specific antigen (PSA) blood tests as part of routine cancer screening. Talk with your health care provider about prostate cancer screening.  Testicular cancer screening is not recommended for adult males who have no symptoms. Screening includes self-exam, a health care provider exam, and other screening tests.  Consult with your health care provider about any symptoms you have or any concerns you have about testicular cancer.  Use sunscreen. Apply sunscreen liberally and repeatedly throughout the day. You should seek shade when your shadow is shorter than you. Protect yourself by wearing long sleeves, pants, a wide-brimmed hat, and sunglasses year round, whenever you are outdoors.  Once a month, do a whole-body skin exam, using a mirror to look at the skin on your back. Tell your health care provider about new moles, moles that have irregular borders, moles that are larger than a pencil eraser, or moles that have changed in shape or color.  Stay current with required vaccines (immunizations).  Influenza vaccine. All adults should be immunized every year.  Tetanus, diphtheria, and acellular pertussis (Td, Tdap) vaccine. An adult who has not previously received Tdap or who does not know his vaccine status should receive 1 dose of Tdap. This initial dose should be followed by tetanus and diphtheria toxoids (Td) booster doses every 10 years. Adults with an unknown or incomplete history of completing a 3-dose immunization series with Td-containing vaccines should begin or complete a primary immunization series including a Tdap dose. Adults should receive a Td booster every 10 years.  Varicella vaccine. An adult without evidence of immunity to varicella should receive 2 doses or a second dose if he has previously received 1 dose.  Human papillomavirus (HPV) vaccine. Males aged 76-21 years who have not received the vaccine previously should receive the 3-dose series. Males aged 22-26 years may be immunized. Immunization is recommended through the age of 87 years for any male who has sex with males and did not get any or all doses earlier. Immunization is recommended for any person with an immunocompromised condition through the age of 21 years if he did not get any or all doses earlier. During the 3-dose series, the  second dose should be obtained 4-8 weeks after the first dose. The third dose should be obtained 24 weeks after the first dose and 16 weeks after the second dose.  Zoster vaccine. One dose is recommended for adults aged 36 years or older unless certain conditions are present.  Measles, mumps, and rubella (MMR) vaccine. Adults born before [birth year] generally are considered immune to measles and mumps. Adults born in [birth year] or later should have 1 or more doses of MMR vaccine unless there is a contraindication to the vaccine or there is laboratory evidence of immunity to each of the three diseases. A routine second dose of MMR vaccine should be obtained  at least 28 days after the first dose for students attending postsecondary schools, health care workers, or international travelers. People who received inactivated measles vaccine or an unknown type of measles vaccine during 1963-1967 should receive 2 doses of MMR vaccine. People who received inactivated mumps vaccine or an unknown type of mumps vaccine before 1979 and are at high risk for mumps infection should consider immunization with 2 doses of MMR vaccine. Unvaccinated health care workers born before [birth year] who lack laboratory evidence of measles, mumps, or rubella immunity or laboratory confirmation of disease should consider measles and mumps immunization with 2 doses of MMR vaccine or rubella immunization with 1 dose of MMR vaccine.  Pneumococcal 13-valent conjugate (PCV13) vaccine. When indicated, a person who is uncertain of his immunization history and has no record of immunization should receive the PCV13 vaccine. An adult aged 62 years or older who has certain medical conditions and has not been previously immunized should receive 1 dose of PCV13 vaccine. This PCV13 should be followed with a dose of pneumococcal polysaccharide (PPSV23) vaccine. The PPSV23 vaccine dose should be obtained at least 8 weeks after the dose of PCV13 vaccine. An adult aged 61 years  or older who has certain medical conditions and previously received 1 or more doses of PPSV23 vaccine should receive 1 dose of PCV13. The PCV13 vaccine dose should be obtained 1 or more years after the last PPSV23 vaccine dose.  Pneumococcal polysaccharide (PPSV23) vaccine. When PCV13 is also indicated, PCV13 should be obtained first. All adults aged 9 years and older should be immunized. An adult younger than age 49 years who has certain medical conditions should be immunized. Any person who resides in a nursing home or long-term care facility should be immunized. An adult smoker should be immunized. People with an immunocompromised condition and certain other conditions should receive both PCV13 and PPSV23 vaccines. People with human immunodeficiency virus (HIV) infection should be immunized as soon as possible after diagnosis. Immunization during chemotherapy or radiation therapy should be avoided. Routine use of PPSV23 vaccine is not recommended for American Indians, [place] Natives, or people younger than 65 years unless there are medical conditions that require PPSV23 vaccine. When indicated, people who have unknown immunization and have no record of immunization should receive PPSV23 vaccine. One-time revaccination 5 years after the first dose of PPSV23 is recommended for people aged 19-64 years who have chronic kidney failure, nephrotic syndrome, asplenia, or immunocompromised conditions. People who received 1-2 doses of PPSV23 before age 26 years should receive another dose of PPSV23 vaccine at age 39 years or later if at least 5 years have passed since the previous dose. Doses of PPSV23 are not needed for people immunized with PPSV23 at or after age 36 years.  Meningococcal vaccine. Adults with asplenia or persistent complement component deficiencies should receive 2 doses of quadrivalent meningococcal conjugate (MenACWY-D) vaccine. The doses should be obtained at least 2 months apart. Microbiologists  working with certain meningococcal bacteria, [place] recruits, people at risk during an outbreak, and people who travel to or live in countries with a high rate of meningitis should be immunized. A first-year college student up through age 70 years who is living in a residence hall should receive a dose if he did not receive a dose on or after his 16th birthday. Adults who have certain high-risk conditions should receive one or more doses of vaccine.  Hepatitis A vaccine. Adults who wish to be protected from this disease, have certain high-risk conditions,  work with hepatitis A-infected animals, work in hepatitis A research labs, or travel to or work in countries with a high rate of hepatitis A should be immunized. Adults who were previously unvaccinated and who anticipate close contact with an international adoptee during the first 60 days after arrival in the [place] from a country with a high rate of hepatitis A should be immunized.  Hepatitis B vaccine. Adults should be immunized if they wish to be protected from this disease, have certain high-risk conditions, may be exposed to blood or other infectious body fluids, are household contacts or sex partners of hepatitis B positive people, are clients or workers in certain care facilities, or travel to or work in countries with a high rate of hepatitis B.  Haemophilus influenzae type b (Hib) vaccine. A previously unvaccinated person with asplenia or sickle cell disease or having a scheduled splenectomy should receive 1 dose of Hib vaccine. Regardless of previous immunization, a recipient of a hematopoietic stem cell transplant should receive a 3-dose series 6-12 months after his successful transplant. Hib vaccine is not recommended for adults with HIV infection. Preventive Service / Frequency Ages 25 to 65  Blood pressure check.** / Every 1 to 2 years.  Lipid and cholesterol check.** / Every 5 years beginning at age 84.  Hepatitis C blood  test.** / For any individual with known risks for hepatitis C.  Skin self-exam. / Monthly.  Influenza vaccine. / Every year.  Tetanus, diphtheria, and acellular pertussis (Tdap, Td) vaccine.** / Consult your health care provider. 1 dose of Td every 10 years.  Varicella vaccine.** / Consult your health care provider.  HPV vaccine. / 3 doses over 6 months, if 48 or younger.  Measles, mumps, rubella (MMR) vaccine.** / You need at least 1 dose of MMR if you were born in 1957 or later. You may also need a second dose.  Pneumococcal 13-valent conjugate (PCV13) vaccine.** / Consult your health care provider.  Pneumococcal polysaccharide (PPSV23) vaccine.** / 1 to 2 doses if you smoke cigarettes or if you have certain conditions.  Meningococcal vaccine.** / 1 dose if you are age 77 to 71 years and a Market researcher living in a residence hall, or have one of several medical conditions. You may also need additional booster doses.  Hepatitis A vaccine.** / Consult your health care provider.  Hepatitis B vaccine.** / Consult your health care provider.  Haemophilus influenzae type b (Hib) vaccine.** / Consult your health care provider. Ages 46 to 75  Blood pressure check.** / Every 1 to 2 years.  Lipid and cholesterol check.** / Every 5 years beginning at age 80.  Lung cancer screening. / Every year if you are aged 48-80 years and have a 30-pack-year history of smoking and currently smoke or have quit within the past 15 years. Yearly screening is stopped once you have quit smoking for at least 15 years or develop a health problem that would prevent you from having lung cancer treatment.  Fecal occult blood test (FOBT) of stool. / Every year beginning at age 77 and continuing until age 18. You may not have to do this test if you get a colonoscopy every 10 years.  Flexible sigmoidoscopy** or colonoscopy.** / Every 5 years for a flexible sigmoidoscopy or every 10 years for a colonoscopy  beginning at age 87 and continuing until age 82.  Hepatitis C blood test.** / For all people born from [birth year] through 1963 and any individual with known risks for  hepatitis C.  Skin self-exam. / Monthly.  Influenza vaccine. / Every year.  Tetanus, diphtheria, and acellular pertussis (Tdap/Td) vaccine.** / Consult your health care provider. 1 dose of Td every 10 years.  Varicella vaccine.** / Consult your health care provider.  Zoster vaccine.** / 1 dose for adults aged 42 years or older.  Measles, mumps, rubella (MMR) vaccine.** / You need at least 1 dose of MMR if you were born in 1955 or later. You may also need a second dose.  Pneumococcal 13-valent conjugate (PCV13) vaccine.** / Consult your health care provider.  Pneumococcal polysaccharide (PPSV23) vaccine.** / 1 to 2 doses if you smoke cigarettes or if you have certain conditions.  Meningococcal vaccine.** / Consult your health care provider.  Hepatitis A vaccine.** / Consult your health care provider.  Hepatitis B vaccine.** / Consult your health care provider.  Haemophilus influenzae type b (Hib) vaccine.** / Consult your health care provider. Ages 71 and over  Blood pressure check.** / Every 1 to 2 years.  Lipid and cholesterol check.**/ Every 5 years beginning at age 56.  Lung cancer screening. / Every year if you are aged 63-80 years and have a 30-pack-year history of smoking and currently smoke or have quit within the past 15 years. Yearly screening is stopped once you have quit smoking for at least 15 years or develop a health problem that would prevent you from having lung cancer treatment.  Fecal occult blood test (FOBT) of stool. / Every year beginning at age 59 and continuing until age 49. You may not have to do this test if you get a colonoscopy every 10 years.  Flexible sigmoidoscopy** or colonoscopy.** / Every 5 years for a flexible sigmoidoscopy or every 10 years for a colonoscopy beginning at age 85 and  continuing until age 25.  Hepatitis C blood test.** / For all people born from [birth year] through 1963 and any individual with known risks for hepatitis C.  Abdominal aortic aneurysm (AAA) screening.** / A one-time screening for ages 76 to 30 years who are current or former smokers.  Skin self-exam. / Monthly.  Influenza vaccine. / Every year.  Tetanus, diphtheria, and acellular pertussis (Tdap/Td) vaccine.** / 1 dose of Td every 10 years.  Varicella vaccine.** / Consult your health care provider.  Zoster vaccine.** / 1 dose for adults aged 43 years or older.  Pneumococcal 13-valent conjugate (PCV13) vaccine.** / Consult your health care provider.  Pneumococcal polysaccharide (PPSV23) vaccine.** / 1 dose for all adults aged 42 years and older.  Meningococcal vaccine.** / Consult your health care provider.  Hepatitis A vaccine.** / Consult your health care provider.  Hepatitis B vaccine.** / Consult your health care provider.  Haemophilus influenzae type b (Hib) vaccine.** / Consult your health care provider. **Family history and personal history of risk and conditions may change your health care provider's recommendations. Document Released: 08/10/2001 Document Revised: 06/19/2013 Document Reviewed: 11/09/2010 [facility] Patient Information 2015 [place], [place]. This information is not intended to replace advice given to you by your health care provider. Make sure you discuss any questions you have with your health care provider.

## 2015-03-01 LAB — LIPID PANEL
CHOLESTEROL: 170 mg/dL (ref 125–200)
HDL: 96 mg/dL (ref >=40)
LDL CALC: 64 mg/dL (ref <130)
Total CHOL/HDL Ratio: 1.8 Ratio (ref <=5.0)
Triglycerides: 48 mg/dL (ref <150)
VLDL: 10 mg/dL (ref <30)

## 2015-03-16 ENCOUNTER — Encounter: Payer: Self-pay | Admitting: Family Medicine

## 2015-03-16 DIAGNOSIS — Z0000 Encounter for general adult medical examination without abnormal findings: Secondary | ICD-10-CM | POA: Insufficient documentation

## 2015-03-16 NOTE — Assessment & Plan Note (Signed)
Patient denies any difficulties at home. No trouble with ADLs, depression or falls. No recent changes to vision or hearing. Is UTD with immunizations. Is UTD with screening. Discussed Advanced Directives, patient agrees to bring [place] copies of documents if can. Encouraged heart healthy diet, exercise as tolerated and adequate sleep. Labs ordered and reviewed.  Follows with cardiology, Dr Aundra Dubin, Dr Bing Neighbors with dermatology, Dr Altamese Cabal Aged out of colonoscopy Also follows with  Neurology and opthamology See problem list for risk factors See AVS for recommended heath care screening

## 2015-03-16 NOTE — Assessment & Plan Note (Signed)
Follows with cardiology and is doing well at this time

## 2015-03-16 NOTE — Assessment & Plan Note (Signed)
Encouraged heart healthy diet, increase exercise, avoid trans fats, consider a krill oil cap daily 

## 2015-03-16 NOTE — Assessment & Plan Note (Signed)
minimize simple carbs. Increase exercise as tolerated.  

## 2015-03-16 NOTE — Assessment & Plan Note (Signed)
Well controlled, no changes to meds. Encouraged heart healthy diet such as the DASH diet and exercise as tolerated.  °

## 2015-03-16 NOTE — Progress Notes (Signed)
Subjective:    Patient ID: Justin [name], male    DOB: Nov 13, 1928, 81 y.o.   MRN: [id number]  Chief Complaint  Patient presents with  . Annual Exam    HPI Patient is in today for annual exam and follow-up on numerous medical conditions. He agrees to flu shot today. Has been struggling with disequilibrium off and on for years and did have an episode of syncope back in April. After workup with neurology and his therapy he is feeling somewhat better but no significant treatable condition was seen. No recent illness. Is doing well with ADLs at home. Tolerating Eliquis  Past Medical History  Diagnosis Date  . CAD (coronary artery disease)     a. H/o MI;  b. s/p CABG  . HTN (hypertension)   . Hyperlipidemia   . BPH (benign prostatic hypertrophy)   . Sigmoid diverticulitis   . Internal hemorrhoids   . Basal cell carcinoma   . Disequilibrium 04/03/2012  . Moderate aortic stenosis     a. 08/2014 Echo: EF 55-60%, no rwma, Gr 1 DD, mod AS, mildly dil asc Ao, mild MR, sev dil LA.  [name] Syncope     a. 08/2014 - ? orthostatic  . PAF (paroxysmal atrial fibrillation)     a. Dx 08/2014;  b. CHA2DS2VASc = 4 - decision made to withold [place] 2/2 unsteady gait and syncope.  . Medicare annual wellness visit, subsequent 03/16/2015    Follows with cardiology, Dr Aundra Dubin, Dr Bing Neighbors with dermatology, Dr Altamese Cabal Aged out of colonoscopy    Past Surgical History  Procedure Laterality Date  . Coronary artery bypass graft    . Colonoscopy    . Shoulder surgery      x2    Family History  Problem Relation Age of Onset  . Coronary artery disease Father   . Hyperlipidemia Father   . Heart attack Father   . Alcohol abuse Father   . Bipolar disorder Mother   . Heart disease Mother   . Alcohol abuse    . Alcohol abuse Brother   . Cancer Brother   . Cancer Maternal Grandfather     oral cancer, chew tobacco  . Cancer Sister   . Glaucoma Sister     Social History   Social History  . Marital  Status: Married    Spouse Name: N/A  . Number of Children: 2  . Years of Education: N/A   Occupational History  . 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 Smoking status: Former Smoker -- 0.50 packs/day for 15 years    Quit date: 06/29/1971  . Smokeless tobacco: Never Used  . Alcohol Use: Yes     Comment: 1-2 glasses of wine  . Drug Use: No  . Sexual Activity: No     Comment: lives with wife, retired English as a second language teacher, no dietary restrictions   Other Topics Concern  . Not on file   Social History Narrative    Outpatient Prescriptions Prior to Visit  Medication Sig Dispense Refill  . acetaminophen (TYLENOL) 325 MG tablet Take 650 mg by mouth every 6 (six) hours as needed for mild pain or moderate pain.    [name] apixaban (ELIQUIS) 5 MG TABS tablet Take 1 tablet (5 mg total) by mouth 2 (two) times daily. 60 tablet 5  . cholecalciferol (VITAMIN D) 1000 UNITS tablet Take 1,000 Units by mouth daily as needed.     . diltiazem (CARDIZEM CD) 120 MG 24  hr capsule Take 1 capsule (120 mg total) by mouth daily. 30 capsule 6  . diphenhydramine-acetaminophen (TYLENOL PM) 25-500 MG TABS Take 1 tablet by mouth at bedtime as needed (insomnia).    . finasteride (PROSCAR) 5 MG tablet Take 1 tablet (5 mg total) by mouth daily. 90 tablet 1  . hydrochlorothiazide (MICROZIDE) 12.5 MG capsule Take 1 capsule (12.5 mg total) by mouth daily. 90 capsule 3  . lovastatin (MEVACOR) 20 MG tablet Take 1 tablet (20 mg total) by mouth at bedtime. 90 tablet 1  . MELATONIN PO Take 1 tablet by mouth at bedtime as needed.     . Multiple Vitamin (MULTIVITAMIN) capsule Take 1 capsule by mouth every other day.     . multivitamin-lutein (OCUVITE-LUTEIN) CAPS Take 1 capsule by mouth daily.     . Probiotic Product (PROBIOTIC DAILY PO) Take 1 capsule by mouth daily.     No facility-administered medications prior to visit.    No Known Allergies  Review of Systems  Constitutional:  Negative for fever, chills and malaise/fatigue.  HENT: Negative for congestion and hearing loss.   Eyes: Negative for discharge.  Respiratory: Negative for cough, sputum production and shortness of breath.   Cardiovascular: Negative for chest pain, palpitations and leg swelling.  Gastrointestinal: Negative for heartburn, nausea, vomiting, abdominal pain, diarrhea, constipation and blood in stool.  Genitourinary: Negative for dysuria, urgency, frequency and hematuria.  Musculoskeletal: Negative for myalgias, back pain and falls.  Skin: Negative for rash.  Neurological: Positive for dizziness. Negative for sensory change, loss of consciousness, weakness and headaches.  Endo/Heme/Allergies: Negative for environmental allergies. Does not bruise/bleed easily.  Psychiatric/Behavioral: Negative for depression and suicidal ideas. The patient is not nervous/anxious and does not have insomnia.        Objective:    Physical Exam  Constitutional: He is oriented to person, place, and time. He appears well-developed and well-nourished. No distress.  HENT:  Head: Normocephalic and atraumatic.  Nose: Nose normal.  Eyes: Conjunctivae are normal. Right eye exhibits no discharge. Left eye exhibits no discharge.  Neck: Normal range of motion. Neck supple. No thyromegaly present.  Cardiovascular: Normal rate and regular rhythm.   Murmur heard. Pulmonary/Chest: Effort normal and breath sounds normal. No respiratory distress. He has no wheezes.  Abdominal: Soft. Bowel sounds are normal. He exhibits no mass. There is no tenderness.  Musculoskeletal: He exhibits no edema.  Lymphadenopathy:    He has no cervical adenopathy.  Neurological: He is alert and oriented to person, place, and time.  Skin: Skin is warm and dry.  Psychiatric: He has a normal mood and affect. His behavior is normal.  Nursing note and vitals reviewed.   BP 143/81 mmHg  Pulse 70  Temp(Src) 98.1 F (36.7 C) (Oral)  Ht 5\' 5"  (1.651  m)  Wt 146 lb 6 oz (66.395 kg)  BMI 24.36 kg/m2  SpO2 98% Wt Readings from Last 3 Encounters:  02/28/15 146 lb 6 oz (66.395 kg)  11/14/14 149 lb (67.586 kg)  09/26/14 149 lb (67.586 kg)     Lab Results  Component Value Date   WBC 8.7 11/14/2014   HGB 13.4 11/14/2014   HCT 39.7 11/14/2014   PLT 373.0 11/14/2014   GLUCOSE 88 12/25/2014   CHOL 170 02/28/2015   TRIG 48 02/28/2015   HDL 96 02/28/2015   LDLCALC 64 02/28/2015   ALT 13 09/22/2012   AST 19 09/22/2012   NA 137 12/25/2014   K 4.1 12/25/2014   CL  100 12/25/2014   CREATININE 1.02 12/25/2014   BUN 15 12/25/2014   CO2 28 12/25/2014   TSH 3.815 02/28/2015   PSA 10.24* 02/06/2009   HGBA1C 5.9* 12/06/2011    Lab Results  Component Value Date   TSH 3.815 02/28/2015   Lab Results  Component Value Date   WBC 8.7 11/14/2014   HGB 13.4 11/14/2014   HCT 39.7 11/14/2014   MCV 84.0 11/14/2014   PLT 373.0 11/14/2014   Lab Results  Component Value Date   NA 137 12/25/2014   K 4.1 12/25/2014   CO2 28 12/25/2014   GLUCOSE 88 12/25/2014   BUN 15 12/25/2014   CREATININE 1.02 12/25/2014   BILITOT 0.5 09/22/2012   ALKPHOS 55 09/22/2012   AST 19 09/22/2012   ALT 13 09/22/2012   PROT 6.4 09/22/2012   ALBUMIN 3.9 11/22/2012   CALCIUM 9.3 12/25/2014   ANIONGAP 9 09/13/2014   GFR 73.89 12/25/2014   Lab Results  Component Value Date   CHOL 170 02/28/2015   Lab Results  Component Value Date   HDL 96 02/28/2015   Lab Results  Component Value Date   LDLCALC 64 02/28/2015   Lab Results  Component Value Date   TRIG 48 02/28/2015   Lab Results  Component Value Date   CHOLHDL 1.8 02/28/2015   Lab Results  Component Value Date   HGBA1C 5.9* 12/06/2011       Assessment & Plan:   Problem List Items Addressed This Visit    Medicare annual wellness visit, subsequent    Patient denies any difficulties at home. No trouble with ADLs, depression or falls. No recent changes to vision or hearing. Is UTD with  immunizations. Is UTD with screening. Discussed Advanced Directives, patient agrees to bring [place] copies of documents if can. Encouraged heart healthy diet, exercise as tolerated and adequate sleep. Labs ordered and reviewed.  Follows with cardiology, Dr Aundra Dubin, Dr Bing Neighbors with dermatology, Dr Altamese Cabal Aged out of colonoscopy Also follows with  Neurology and opthamology See problem list for risk factors See AVS for recommended heath care screening      Hyperlipidemia    Encouraged heart healthy diet, increase exercise, avoid trans fats, consider a krill oil cap daily      Hyperglycemia     minimize simple carbs. Increase exercise as tolerated.       Essential hypertension    Well controlled, no changes to meds. Encouraged heart healthy diet such as the DASH diet and exercise as tolerated.       Relevant Orders   TSH (Completed)   Aortic stenosis    Follows with cardiology and is doing well at this time       Other Visit Diagnoses    Need for influenza vaccination    -  Primary    Relevant Orders    Flu vaccine HIGH DOSE PF (Fluzone High dose) (Completed)    Hyperlipidemia, mild        Relevant Orders    Lipid panel (Completed)       I am having Mr. Koudelka maintain his multivitamin, MELATONIN PO, cholecalciferol, multivitamin-lutein, finasteride, acetaminophen, diphenhydramine-acetaminophen, Probiotic Product (PROBIOTIC DAILY PO), diltiazem, hydrochlorothiazide, lovastatin, and apixaban.  No orders of the defined types were placed in this encounter.     [name], MD

## 2015-04-06 ENCOUNTER — Other Ambulatory Visit: Payer: Self-pay | Admitting: Cardiology

## 2015-04-07 ENCOUNTER — Other Ambulatory Visit: Payer: Self-pay | Admitting: Cardiology

## 2015-04-07 MED ORDER — DILTIAZEM HCL ER COATED BEADS 120 MG PO CP24: 120.0000 mg | ORAL_CAPSULE | Freq: Every day | ORAL | Status: DC

## 2015-05-27 ENCOUNTER — Other Ambulatory Visit: Payer: Self-pay | Admitting: Cardiology

## 2015-07-08 ENCOUNTER — Other Ambulatory Visit: Payer: Self-pay | Admitting: Cardiology

## 2015-07-09 DIAGNOSIS — Z85828 Personal history of other malignant neoplasm of skin: Secondary | ICD-10-CM | POA: Diagnosis not present

## 2015-07-09 DIAGNOSIS — Z08 Encounter for follow-up examination after completed treatment for malignant neoplasm: Secondary | ICD-10-CM | POA: Diagnosis not present

## 2015-07-09 DIAGNOSIS — D0439 Carcinoma in situ of skin of other parts of face: Secondary | ICD-10-CM | POA: Diagnosis not present

## 2015-07-09 DIAGNOSIS — L570 Actinic keratosis: Secondary | ICD-10-CM | POA: Diagnosis not present

## 2015-07-10 ENCOUNTER — Other Ambulatory Visit: Payer: Self-pay | Admitting: Cardiology

## 2015-07-27 ENCOUNTER — Other Ambulatory Visit: Payer: Self-pay | Admitting: Cardiology

## 2015-07-28 ENCOUNTER — Telehealth: Payer: Self-pay | Admitting: Cardiology

## 2015-07-28 ENCOUNTER — Other Ambulatory Visit: Payer: Self-pay | Admitting: *Deleted

## 2015-07-28 MED ORDER — APIXABAN 5 MG PO TABS: 5.0000 mg | ORAL_TABLET | Freq: Two times a day (BID) | ORAL | Status: DC

## 2015-07-28 NOTE — Telephone Encounter (Signed)
New Message   *STAT* If patient is at the pharmacy, call can be transferred to refill team.   1. Which medications need to be refilled? (please list name of each medication and dose if known) Elliquis   2. Which pharmacy/location (including street and city if local pharmacy) is medication to be sent to? [place] [facility] on [address].   3. Do they need a 30 day or 90 day supply? 90 day.  Pt is going to [place] will need this filled immediately

## 2015-08-13 ENCOUNTER — Other Ambulatory Visit: Payer: Self-pay | Admitting: Cardiology

## 2015-08-27 ENCOUNTER — Other Ambulatory Visit: Payer: Self-pay | Admitting: Cardiology

## 2015-09-22 ENCOUNTER — Emergency Department (HOSPITAL_BASED_OUTPATIENT_CLINIC_OR_DEPARTMENT_OTHER)
Admission: EM | Admit: 2015-09-22 | Discharge: 2015-09-22 | Disposition: A | Discharge status: Home / Self Care | Payer: PPO | Attending: Emergency Medicine | Admitting: Emergency Medicine

## 2015-09-22 ENCOUNTER — Encounter (HOSPITAL_BASED_OUTPATIENT_CLINIC_OR_DEPARTMENT_OTHER): Payer: Self-pay | Admitting: *Deleted

## 2015-09-22 ENCOUNTER — Emergency Department (HOSPITAL_BASED_OUTPATIENT_CLINIC_OR_DEPARTMENT_OTHER): Payer: PPO

## 2015-09-22 DIAGNOSIS — E785 Hyperlipidemia, unspecified: Secondary | ICD-10-CM | POA: Diagnosis not present

## 2015-09-22 DIAGNOSIS — Z951 Presence of aortocoronary bypass graft: Secondary | ICD-10-CM | POA: Insufficient documentation

## 2015-09-22 DIAGNOSIS — Z8719 Personal history of other diseases of the digestive system: Secondary | ICD-10-CM | POA: Diagnosis not present

## 2015-09-22 DIAGNOSIS — B349 Viral infection, unspecified: Secondary | ICD-10-CM | POA: Insufficient documentation

## 2015-09-22 DIAGNOSIS — Z7901 Long term (current) use of anticoagulants: Secondary | ICD-10-CM | POA: Insufficient documentation

## 2015-09-22 DIAGNOSIS — R05 Cough: Secondary | ICD-10-CM | POA: Diagnosis not present

## 2015-09-22 DIAGNOSIS — Z87891 Personal history of nicotine dependence: Secondary | ICD-10-CM | POA: Diagnosis not present

## 2015-09-22 DIAGNOSIS — I252 Old myocardial infarction: Secondary | ICD-10-CM | POA: Diagnosis not present

## 2015-09-22 DIAGNOSIS — R0602 Shortness of breath: Secondary | ICD-10-CM | POA: Diagnosis not present

## 2015-09-22 DIAGNOSIS — I10 Essential (primary) hypertension: Secondary | ICD-10-CM | POA: Insufficient documentation

## 2015-09-22 DIAGNOSIS — I2510 Atherosclerotic heart disease of native coronary artery without angina pectoris: Secondary | ICD-10-CM | POA: Insufficient documentation

## 2015-09-22 DIAGNOSIS — Z85828 Personal history of other malignant neoplasm of skin: Secondary | ICD-10-CM | POA: Diagnosis not present

## 2015-09-22 DIAGNOSIS — I480 Paroxysmal atrial fibrillation: Secondary | ICD-10-CM | POA: Diagnosis not present

## 2015-09-22 DIAGNOSIS — N400 Enlarged prostate without lower urinary tract symptoms: Secondary | ICD-10-CM | POA: Insufficient documentation

## 2015-09-22 DIAGNOSIS — Z79899 Other long term (current) drug therapy: Secondary | ICD-10-CM | POA: Diagnosis not present

## 2015-09-22 DIAGNOSIS — R531 Weakness: Secondary | ICD-10-CM | POA: Diagnosis not present

## 2015-09-22 DIAGNOSIS — R5383 Other fatigue: Secondary | ICD-10-CM | POA: Diagnosis not present

## 2015-09-22 LAB — URINALYSIS, ROUTINE W REFLEX MICROSCOPIC
Bilirubin Urine: NEGATIVE
Glucose, UA: NEGATIVE mg/dL
Hgb urine dipstick: NEGATIVE
Ketones, ur: NEGATIVE mg/dL
Leukocytes, UA: NEGATIVE
NITRITE: NEGATIVE
PH: 6 (ref 5.0–8.0)
Protein, ur: 30 mg/dL — AB
SPECIFIC GRAVITY, URINE: 1.025 (ref 1.005–1.030)

## 2015-09-22 LAB — CBC WITH DIFFERENTIAL/PLATELET
BASOS PCT: 0 %
Basophils Absolute: 0 10*3/uL (ref 0.0–0.1)
EOS ABS: 0.1 10*3/uL (ref 0.0–0.7)
Eosinophils Relative: 1 %
HEMATOCRIT: 36.1 % — AB (ref 39.0–52.0)
HEMOGLOBIN: 12.4 g/dL — AB (ref 13.0–17.0)
LYMPHS PCT: 10 %
Lymphs Abs: 0.7 10*3/uL (ref 0.7–4.0)
MCH: 29.2 pg (ref 26.0–34.0)
MCHC: 34.3 g/dL (ref 30.0–36.0)
MCV: 84.9 fL (ref 78.0–100.0)
MONO ABS: 0.9 10*3/uL (ref 0.1–1.0)
Monocytes Relative: 13 %
Neutro Abs: 5.3 10*3/uL (ref 1.7–7.7)
Neutrophils Relative %: 76 %
Platelets: 266 10*3/uL (ref 150–400)
RBC: 4.25 MIL/uL (ref 4.22–5.81)
RDW: 14.7 % (ref 11.5–15.5)
WBC: 6.9 10*3/uL (ref 4.0–10.5)

## 2015-09-22 LAB — COMPREHENSIVE METABOLIC PANEL
ALBUMIN: 3.6 g/dL (ref 3.5–5.0)
ALK PHOS: 51 U/L (ref 38–126)
ALT: 19 U/L (ref 17–63)
AST: 31 U/L (ref 15–41)
Anion gap: 8 (ref 5–15)
BILIRUBIN TOTAL: 0.6 mg/dL (ref 0.3–1.2)
BUN: 15 mg/dL (ref 6–20)
CALCIUM: 8.7 mg/dL — AB (ref 8.9–10.3)
CO2: 24 mmol/L (ref 22–32)
Chloride: 98 mmol/L — ABNORMAL LOW (ref 101–111)
Creatinine, Ser: 0.97 mg/dL (ref 0.61–1.24)
GFR calc Af Amer: >60 mL/min (ref >60)
GFR calc non Af Amer: >60 mL/min (ref >60)
GLUCOSE: 113 mg/dL — AB (ref 65–99)
POTASSIUM: 4 mmol/L (ref 3.5–5.1)
Sodium: 130 mmol/L — ABNORMAL LOW (ref 135–145)
TOTAL PROTEIN: 6.5 g/dL (ref 6.5–8.1)

## 2015-09-22 LAB — URINE MICROSCOPIC-ADD ON: WBC, UA: NONE SEEN WBC/hpf (ref 0–5)

## 2015-09-22 LAB — RAPID STREP SCREEN (MED CTR MEBANE ONLY): Streptococcus, Group A Screen (Direct): NEGATIVE

## 2015-09-22 MED ORDER — KETOROLAC TROMETHAMINE 15 MG/ML IJ SOLN
15.0000 mg | Freq: Once | INTRAMUSCULAR | Status: AC
  Administered 2015-09-22: 15 mg via INTRAVENOUS
  Filled 2015-09-22: qty 1

## 2015-09-22 NOTE — ED Notes (Signed)
Pt has been having flu like symptoms for past week. C/o occasional cough, sore throat, and yesterday with general weakness. Pt did take his flu shot this year. C/o feeling a little sob. resp even and unlabored at present. Fevers unknown and denies chills.

## 2015-09-22 NOTE — Discharge Instructions (Signed)
Viral Infections °A viral infection can be caused by different types of viruses. Most viral infections are not serious and resolve on their own. However, some infections may cause severe symptoms and may lead to further complications. °SYMPTOMS °Viruses can frequently cause: °· Minor sore throat. °· Aches and pains. °· Headaches. °· Runny nose. °· Different types of rashes. °· Watery eyes. °· Tiredness. °· Cough. °· Loss of appetite. °· Gastrointestinal infections, resulting in nausea, vomiting, and diarrhea. °These symptoms do not respond to antibiotics because the infection is not caused by bacteria. However, you might catch a bacterial infection following the viral infection. This is sometimes called a "superinfection." Symptoms of such a bacterial infection may include: °· Worsening sore throat with pus and difficulty swallowing. °· Swollen neck glands. °· Chills and a high or persistent fever. °· Severe headache. °· Tenderness over the sinuses. °· Persistent overall ill feeling (malaise), muscle aches, and tiredness (fatigue). °· Persistent cough. °· Yellow, green, or brown mucus production with coughing. °HOME CARE INSTRUCTIONS  °· Only take over-the-counter or prescription medicines for pain, discomfort, diarrhea, or fever as directed by your caregiver. °· Drink enough water and fluids to keep your urine clear or pale yellow. Sports drinks can provide valuable electrolytes, sugars, and hydration. °· Get plenty of rest and maintain proper nutrition. Soups and broths with crackers or Alcaide are fine. °SEEK IMMEDIATE MEDICAL CARE IF:  °· You have severe headaches, shortness of breath, chest pain, neck pain, or an unusual rash. °· You have uncontrolled vomiting, diarrhea, or you are unable to keep down fluids. °· You or your child has an oral temperature above 102° F (38.9° C), not controlled by medicine. °· Your baby is older than 3 months with a rectal temperature of 102° F (38.9° C) or higher. °· Your baby is 3  months old or younger with a rectal temperature of 100.4° F (38° C) or higher. °MAKE SURE YOU:  °· Understand these instructions. °· Will watch your condition. °· Will get help right away if you are not doing well or get worse. °  °This information is not intended to replace advice given to you by your health care provider. Make sure you discuss any questions you have with your health care provider. °  °Document Released: 03/24/2005 Document Revised: 09/06/2011 Document Reviewed: 11/20/2014 °Elsevier Interactive Patient Education ©2016 [employer] Inc[employer] ° °

## 2015-09-22 NOTE — ED Provider Notes (Signed)
CSN: [id number]     Arrival date & time 09/22/15  0248 History   First MD Initiated Contact with Patient 09/22/15 0501     Chief Complaint  Patient presents with  . Fatigue     (Consider location/radiation/quality/duration/timing/severity/associated sxs/prior Treatment) HPI  This is an 82 year old male with a free a history of sore throat, occasional cough, generalized joint pain and generalized weakness. He denies shortness of breath, fever, chills, nausea, vomiting or diarrhea. His joint pain is severe enough that he had difficulty getting up off the floor this morning so his wife had to help him up to bring him here. His pain is worse with movement or cough. He has taken Tylenol without relief of his pain. He has taken Robitussin with some relief of his cough.  Past Medical History  Diagnosis Date  . CAD (coronary artery disease)     a. H/o MI;  b. s/p CABG  . HTN (hypertension)   . Hyperlipidemia   . BPH (benign prostatic hypertrophy)   . Sigmoid diverticulitis   . Internal hemorrhoids   . Basal cell carcinoma   . Disequilibrium 04/03/2012  . Moderate aortic stenosis     a. 08/2014 Echo: EF 55-60%, no rwma, Gr 1 DD, mod AS, mildly dil asc Ao, mild MR, sev dil LA.  [name] Syncope     a. 08/2014 - ? orthostatic  . PAF (paroxysmal atrial fibrillation) ([place])     a. Dx 08/2014;  b. CHA2DS2VASc = 4 - decision made to withold [place] 2/2 unsteady gait and syncope.  . Medicare annual wellness visit, subsequent 03/16/2015    Follows with cardiology, Dr Aundra Dubin, Dr Bing Neighbors with dermatology, Dr Altamese Cabal Aged out of colonoscopy   Past Surgical History  Procedure Laterality Date  . Coronary artery bypass graft    . Colonoscopy    . Shoulder surgery      x2   Family History  Problem Relation Age of Onset  . Coronary artery disease Father   . Hyperlipidemia Father   . Heart attack Father   . Alcohol abuse Father   . Bipolar disorder Mother   . Heart disease Mother   . Alcohol abuse     . Alcohol abuse Brother   . Cancer Brother   . Cancer Maternal Grandfather     oral cancer, chew tobacco  . Cancer Sister   . Glaucoma Sister    Social History  Substance Use Topics  . Smoking status: Former Smoker -- 0.50 packs/day for 15 years    Quit date: 06/29/1971  . Smokeless tobacco: Never Used  . Alcohol Use: Yes     Comment: 1-2 glasses of wine    Review of Systems  All other systems reviewed and are negative.   Allergies  Review of patient's allergies indicates no known allergies.  Home Medications   Prior to Admission medications   Medication Sig Start Date End Date Taking? Authorizing Provider  acetaminophen (TYLENOL) 325 MG tablet Take 650 mg by mouth every 6 (six) hours as needed for mild pain or moderate pain.    Historical Provider, MD  apixaban (ELIQUIS) 5 MG TABS tablet Take 1 tablet (5 mg total) by mouth 2 (two) times daily. 07/28/15   Larey Dresser, MD  CARTIA XT 120 MG 24 hr capsule TAKE ONE CAPSULE BY MOUTH ONCE DAILY 08/13/15   Larey Dresser, MD  cholecalciferol (VITAMIN D) 1000 UNITS tablet Take 1,000 Units by mouth daily as needed.  Historical Provider, MD  diphenhydramine-acetaminophen (TYLENOL PM) 25-500 MG TABS Take 1 tablet by mouth at bedtime as needed (insomnia).    Historical Provider, MD  finasteride (PROSCAR) 5 MG tablet Take 1 tablet (5 mg total) by mouth daily. 08/07/13   Mosie Lukes, MD  hydrochlorothiazide (MICROZIDE) 12.5 MG capsule Take 1 capsule (12.5 mg total) by mouth daily. 09/20/14   Carlena Bjornstad, MD  lovastatin (MEVACOR) 20 MG tablet TAKE ONE TABLET BY MOUTH ONCE DAILY AT BEDTIME **PLEASE CALL [phone] FOR AN APPT** 08/28/15   Larey Dresser, MD  MELATONIN PO Take 1 tablet by mouth at bedtime as needed.     Historical Provider, MD  Multiple Vitamin (MULTIVITAMIN) capsule Take 1 capsule by mouth every other day.     Historical Provider, MD  multivitamin-lutein [facility]) CAPS Take 1 capsule by mouth daily.      Historical Provider, MD  Probiotic Product (PROBIOTIC DAILY PO) Take 1 capsule by mouth daily.    Historical Provider, MD   BP 128/69 mmHg  Pulse 62  Temp(Src) 98.9 F (37.2 C) (Oral)  Resp 18  Ht 5\' 6"  (1.676 m)  Wt 147 lb (66.679 kg)  BMI 23.74 kg/m2  SpO2 97%   Physical Exam  General: Well-developed, well-nourished male in no acute distress; appearance consistent with age of record HENT: normocephalic; atraumatic; no pharyngeal erythema or exudate Eyes: pupils equal, round and reactive to light; extraocular muscles intact; lens implants Neck: supple Heart: regular rate and rhythm Lungs: clear to auscultation bilaterally Abdomen: soft; nondistended; nontender; no masses or hepatosplenomegaly; bowel sounds present Extremities: Arthritic changes; decreased range of motion due to joint pain; no edema Neurologic: Awake, alert and oriented; motor function intact in all extremities and symmetric; no facial droop Skin: Warm and dry Psychiatric: Normal mood and affect    ED Course  Procedures (including critical care time)   MDM   Nursing notes and vitals signs, including pulse oximetry, reviewed.  Summary of this visit's results, reviewed by myself:  Labs:  Results for orders placed or performed during the hospital encounter of 09/22/15 (from the past 24 hour(s))  Rapid strep screen     Status: None   Collection Time: 09/22/15  3:25 AM  Result Value Ref Range   Streptococcus, Group A Screen (Direct) NEGATIVE NEGATIVE  Comprehensive metabolic panel     Status: Abnormal   Collection Time: 09/22/15  3:50 AM  Result Value Ref Range   Sodium 130 (L) 135 - 145 mmol/L   Potassium 4.0 3.5 - 5.1 mmol/L   Chloride 98 (L) 101 - 111 mmol/L   CO2 24 22 - 32 mmol/L   Glucose, Bld 113 (H) 65 - 99 mg/dL   BUN 15 6 - 20 mg/dL   Creatinine, Ser 0.97 0.61 - 1.24 mg/dL   Calcium 8.7 (L) 8.9 - 10.3 mg/dL   Total Protein 6.5 6.5 - 8.1 g/dL   Albumin 3.6 3.5 - 5.0 g/dL   AST 31 15 - 41  U/L   ALT 19 17 - 63 U/L   Alkaline Phosphatase 51 38 - 126 U/L   Total Bilirubin 0.6 0.3 - 1.2 mg/dL   GFR calc non Af Amer >60 >60 mL/min   GFR calc Af Amer >60 >60 mL/min   Anion gap 8 5 - 15  CBC WITH DIFFERENTIAL     Status: Abnormal   Collection Time: 09/22/15  3:50 AM  Result Value Ref Range   WBC 6.9 4.0 - 10.5 K/uL  RBC 4.25 4.22 - 5.81 MIL/uL   Hemoglobin 12.4 (L) 13.0 - 17.0 g/dL   HCT 36.1 (L) 39.0 - 52.0 %   MCV 84.9 78.0 - 100.0 fL   MCH 29.2 26.0 - 34.0 pg   MCHC 34.3 30.0 - 36.0 g/dL   RDW 14.7 11.5 - 15.5 %   Platelets 266 150 - 400 K/uL   Neutrophils Relative % 76 %   Neutro Abs 5.3 1.7 - 7.7 K/uL   Lymphocytes Relative 10 %   Lymphs Abs 0.7 0.7 - 4.0 K/uL   Monocytes Relative 13 %   Monocytes Absolute 0.9 0.1 - 1.0 K/uL   Eosinophils Relative 1 %   Eosinophils Absolute 0.1 0.0 - 0.7 K/uL   Basophils Relative 0 %   Basophils Absolute 0.0 0.0 - 0.1 K/uL  Urinalysis, Routine w reflex microscopic (not at [facility])     Status: Abnormal   Collection Time: 09/22/15  5:05 AM  Result Value Ref Range   Color, Urine YELLOW YELLOW   APPearance CLEAR CLEAR   Specific Gravity, Urine 1.025 1.005 - 1.030   pH 6.0 5.0 - 8.0   Glucose, UA NEGATIVE NEGATIVE mg/dL   Hgb urine dipstick NEGATIVE NEGATIVE   Bilirubin Urine NEGATIVE NEGATIVE   Ketones, ur NEGATIVE NEGATIVE mg/dL   Protein, ur 30 (A) NEGATIVE mg/dL   Nitrite NEGATIVE NEGATIVE   Leukocytes, UA NEGATIVE NEGATIVE  Urine microscopic-add on     Status: Abnormal   Collection Time: 09/22/15  5:05 AM  Result Value Ref Range   Squamous Epithelial / LPF 0-5 (A) NONE SEEN   WBC, UA NONE SEEN 0 - 5 WBC/hpf   RBC / HPF 0-5 0 - 5 RBC/hpf   Bacteria, UA RARE (A) NONE SEEN    Imaging Studies: Dg Chest 2 View  09/22/2015  CLINICAL DATA:  Acute onset of cough and sore throat. Generalized weakness and shortness of breath. Initial encounter. EXAM: CHEST  2 VIEW COMPARISON:  Chest radiograph from 01/05/2005 FINDINGS: The  lungs are well-aerated. Peribronchial thickening is noted. There is no evidence of focal opacification, pleural effusion or pneumothorax. Calcified bilateral hilar and mediastinal nodes are again seen. The heart is normal in size; the patient is status post median sternotomy, with evidence of prior CABG. No acute osseous abnormalities are seen. IMPRESSION: 1. No acute cardiopulmonary process seen. 2. Calcified bilateral hilar and mediastinal nodes again seen, reflecting remote granulomatous disease. Peribronchial thickening noted. Electronically Signed   By: Garald Balding M.D.   On: 09/22/2015 03:36   6:20 AM Patient feels significantly better after Toradol 15 milligrams IV. His symptoms are likely due to an acute viral illness. He was advised to take Tylenol and/or Aleve for pain.  Shanon Rosser, MD 09/22/15 [phone]

## 2015-09-24 LAB — CULTURE, GROUP A STREP (THRC)

## 2015-09-25 ENCOUNTER — Encounter (HOSPITAL_BASED_OUTPATIENT_CLINIC_OR_DEPARTMENT_OTHER): Payer: Self-pay

## 2015-09-25 ENCOUNTER — Telehealth: Payer: Self-pay | Admitting: Family Medicine

## 2015-09-25 ENCOUNTER — Emergency Department (HOSPITAL_BASED_OUTPATIENT_CLINIC_OR_DEPARTMENT_OTHER)
Admission: EM | Admit: 2015-09-25 | Discharge: 2015-09-25 | Disposition: A | Discharge status: Home / Self Care | Payer: PPO | Attending: Emergency Medicine | Admitting: Emergency Medicine

## 2015-09-25 ENCOUNTER — Emergency Department (HOSPITAL_BASED_OUTPATIENT_CLINIC_OR_DEPARTMENT_OTHER): Payer: PPO

## 2015-09-25 DIAGNOSIS — Z87891 Personal history of nicotine dependence: Secondary | ICD-10-CM | POA: Insufficient documentation

## 2015-09-25 DIAGNOSIS — Z8719 Personal history of other diseases of the digestive system: Secondary | ICD-10-CM | POA: Diagnosis not present

## 2015-09-25 DIAGNOSIS — R079 Chest pain, unspecified: Secondary | ICD-10-CM | POA: Diagnosis not present

## 2015-09-25 DIAGNOSIS — R05 Cough: Secondary | ICD-10-CM

## 2015-09-25 DIAGNOSIS — I10 Essential (primary) hypertension: Secondary | ICD-10-CM | POA: Insufficient documentation

## 2015-09-25 DIAGNOSIS — R059 Cough, unspecified: Secondary | ICD-10-CM

## 2015-09-25 DIAGNOSIS — Z951 Presence of aortocoronary bypass graft: Secondary | ICD-10-CM | POA: Insufficient documentation

## 2015-09-25 DIAGNOSIS — Z85828 Personal history of other malignant neoplasm of skin: Secondary | ICD-10-CM | POA: Diagnosis not present

## 2015-09-25 DIAGNOSIS — Z87448 Personal history of other diseases of urinary system: Secondary | ICD-10-CM | POA: Insufficient documentation

## 2015-09-25 DIAGNOSIS — R0789 Other chest pain: Secondary | ICD-10-CM | POA: Diagnosis not present

## 2015-09-25 DIAGNOSIS — E785 Hyperlipidemia, unspecified: Secondary | ICD-10-CM | POA: Insufficient documentation

## 2015-09-25 DIAGNOSIS — I2510 Atherosclerotic heart disease of native coronary artery without angina pectoris: Secondary | ICD-10-CM | POA: Insufficient documentation

## 2015-09-25 LAB — COMPREHENSIVE METABOLIC PANEL
ALBUMIN: 3.2 g/dL — AB (ref 3.5–5.0)
ALT: 23 U/L (ref 17–63)
ANION GAP: 7 (ref 5–15)
AST: 37 U/L (ref 15–41)
Alkaline Phosphatase: 53 U/L (ref 38–126)
BUN: 17 mg/dL (ref 6–20)
CHLORIDE: 98 mmol/L — AB (ref 101–111)
CO2: 26 mmol/L (ref 22–32)
Calcium: 8.5 mg/dL — ABNORMAL LOW (ref 8.9–10.3)
Creatinine, Ser: 0.91 mg/dL (ref 0.61–1.24)
GFR calc Af Amer: >60 mL/min (ref >60)
GFR calc non Af Amer: >60 mL/min (ref >60)
GLUCOSE: 105 mg/dL — AB (ref 65–99)
POTASSIUM: 4.5 mmol/L (ref 3.5–5.1)
SODIUM: 131 mmol/L — AB (ref 135–145)
Total Bilirubin: 0.5 mg/dL (ref 0.3–1.2)
Total Protein: 6.2 g/dL — ABNORMAL LOW (ref 6.5–8.1)

## 2015-09-25 LAB — CBC
HCT: 35.6 % — ABNORMAL LOW (ref 39.0–52.0)
Hemoglobin: 12.2 g/dL — ABNORMAL LOW (ref 13.0–17.0)
MCH: 28.9 pg (ref 26.0–34.0)
MCHC: 34.3 g/dL (ref 30.0–36.0)
MCV: 84.4 fL (ref 78.0–100.0)
Platelets: 247 10*3/uL (ref 150–400)
RBC: 4.22 MIL/uL (ref 4.22–5.81)
RDW: 14.2 % (ref 11.5–15.5)
WBC: 4.3 10*3/uL (ref 4.0–10.5)

## 2015-09-25 LAB — URINALYSIS, ROUTINE W REFLEX MICROSCOPIC
Bilirubin Urine: NEGATIVE
GLUCOSE, UA: NEGATIVE mg/dL
Ketones, ur: NEGATIVE mg/dL
NITRITE: NEGATIVE
PROTEIN: 30 mg/dL — AB
Specific Gravity, Urine: 1.01 (ref 1.005–1.030)
pH: 7 (ref 5.0–8.0)

## 2015-09-25 LAB — URINE MICROSCOPIC-ADD ON

## 2015-09-25 MED ORDER — IBUPROFEN 400 MG PO TABS: 400.0000 mg | ORAL_TABLET | Freq: Once | ORAL | Status: DC

## 2015-09-25 MED ORDER — DOXYCYCLINE HYCLATE 100 MG PO TABS
100.0000 mg | ORAL_TABLET | Freq: Once | ORAL | Status: AC
  Administered 2015-09-25: 100 mg via ORAL
  Filled 2015-09-25: qty 1

## 2015-09-25 MED ORDER — DOXYCYCLINE HYCLATE 100 MG PO CAPS: 100.0000 mg | ORAL_CAPSULE | Freq: Two times a day (BID) | ORAL | Status: DC

## 2015-09-25 NOTE — Telephone Encounter (Signed)
Wife ([place]) called stating that patient is having chest pain. States that he was given Toradol on Sunday evening in the ER and is now having chest pains. Transferred to [facility]. Spoke to [place]

## 2015-09-25 NOTE — Telephone Encounter (Signed)
Noted awaiting [facility] Note/instructions.

## 2015-09-25 NOTE — Telephone Encounter (Signed)
[facility] [place] [facility] - Client [facility] Patient Name: YERALDO Delsignore DOB: 17-Jan-1929 Initial Comment Caller states her husband is having chest pain. Nurse Assessment Nurse: Donovan Kail, RN, [place] [place] Date/Time (Eastern Time): 09/25/2015 3:30:51 PM Confirm and document reason for call. If symptomatic, describe symptoms. You must click the next button to save text entered. ---Caller states her husband is having chest pain. He is a heart patient but thinks it feels like gas pain. Tamiflu- making him sick. He has the flu. Pain level 7/10 when sneezes. Has the patient traveled out of the country within the last 30 days? ---Not Applicable Does the patient have any new or worsening symptoms? ---Yes Will a triage be completed? ---Yes Related visit to physician within the last 2 weeks? ---Yes Does the PT have any chronic conditions? (i.e. diabetes, asthma, etc.) ---Yes List chronic conditions. ---4 way bypasses heart, balloon procedure before stents, HTN, eloquis, ankle swelling, Afib. occassionallyIs this a behavioral health or substance abuse call? ---No Guidelines Guideline Title Affirmed Question Affirmed Notes Chest Pain Taking a deep breath makes pain worse Final Disposition User Go to ED Now (or PCP triage) Donovan Kail, RN, [place] right ribs have been hurting when he breathes for 4 days. It is pain 7/10 when he coughs. He does not have constant chest pain. pasting record in epic for [place] UNDECIDED Disagree/Comply: Comply Call Id: [id number]

## 2015-09-25 NOTE — Discharge Instructions (Signed)
It was our pleasure to provide your ER care today - we hope that you feel better.  Rest. Drink plenty of fluids.  Take doxycycline (antibiotic) as prescribed.  Take tylenol as need for pain.  Take mucinex or nyquil as need for cough/congestion.  Follow up with primary care doctor in the next few days for recheck if symptoms fail to improve/resolve.  Return to ER if worse, new symptoms, breathing worsens, severe pain, high fevers, weak/fainting, confused, other concern.    Cough, Adult Coughing is a reflex that clears your throat and your airways. Coughing helps to heal and protect your lungs. It is normal to cough occasionally, but a cough that happens with other symptoms or lasts a long time may be a sign of a condition that needs treatment. A cough may last only 2-3 weeks (acute), or it may last longer than 8 weeks (chronic). CAUSES Coughing is commonly caused by:  Breathing in substances that irritate your lungs.  A viral or bacterial respiratory infection.  Allergies.  Asthma.  Postnasal drip.  Smoking.  Acid backing up from the stomach into the esophagus (gastroesophageal reflux).  Certain medicines.  Chronic lung problems, including COPD (or rarely, lung cancer).  Other medical conditions such as heart failure. HOME CARE INSTRUCTIONS  Pay attention to any changes in your symptoms. Take these actions to help with your discomfort:  Take medicines only as told by your health care provider.  If you were prescribed an antibiotic medicine, take it as told by your health care provider. Do not stop taking the antibiotic even if you start to feel better.  Talk with your health care provider before you take a cough suppressant medicine.  Drink enough fluid to keep your urine clear or pale yellow.  If the air is dry, use a cold steam vaporizer or humidifier in your bedroom or your home to help loosen secretions.  Avoid anything that causes you to cough at work or at  home.  If your cough is worse at night, try sleeping in a semi-upright position.  Avoid cigarette smoke. If you smoke, quit smoking. If you need help quitting, ask your health care provider.  Avoid caffeine.  Avoid alcohol.  Rest as needed. SEEK MEDICAL CARE IF:   You have new symptoms.  You cough up pus.  Your cough does not get better after 2-3 weeks, or your cough gets worse.  You cannot control your cough with suppressant medicines and you are losing sleep.  You develop pain that is getting worse or pain that is not controlled with pain medicines.  You have a fever.  You have unexplained weight loss.  You have night sweats. SEEK IMMEDIATE MEDICAL CARE IF:  You cough up blood.  You have difficulty breathing.  Your heartbeat is very fast.   This information is not intended to replace advice given to you by your health care provider. Make sure you discuss any questions you have with your health care provider.   Document Released: 12/11/2010 Document Revised: 03/05/2015 Document Reviewed: 08/21/2014 Elsevier Interactive Patient Education 2016 [place] is an inflammation and swelling of the lining of the lungs (pleura). Because of this inflammation, it hurts to breathe. It can be aggravated by coughing, laughing, or deep breathing. Pleurisy is often caused by an underlying infection or disease.  HOME CARE INSTRUCTIONS  Monitor your pleurisy for any changes. The following actions may help to alleviate any discomfort you are experiencing:  Medicine may help  with pain. Only take over-the-counter or prescription medicines for pain, discomfort, or fever as directed by your health care provider.  Only take antibiotic medicine as directed. Make sure to finish it even if you start to feel better. SEEK MEDICAL CARE IF:   Your pain is not controlled with medicine or is increasing.  You have an increase in pus-like (purulent) secretions brought up  with coughing. SEEK IMMEDIATE MEDICAL CARE IF:   You have blue or dark lips, fingernails, or toenails.  You are coughing up blood.  You have increased difficulty breathing.  You have continuing pain unrelieved by medicine or pain lasting more than 1 week.  You have pain that radiates into your neck, arms, or jaw.  You develop increased shortness of breath or wheezing.  You develop a fever, rash, vomiting, fainting, or other serious symptoms. MAKE SURE YOU:  Understand these instructions.   Will watch your condition.   Will get help right away if you are not doing well or get worse.    This information is not intended to replace advice given to you by your health care provider. Make sure you discuss any questions you have with your health care provider.   Document Released: 06/14/2005 Document Revised: 02/14/2013 Document Reviewed: 11/26/2012 Elsevier Interactive Patient Education [employer].

## 2015-09-25 NOTE — ED Provider Notes (Addendum)
CSN: [id number]     Arrival date & time 09/25/15  1604 History   First MD Initiated Contact with Patient 09/25/15 1609     Chief Complaint  Patient presents with  . Chest Pain     (Consider location/radiation/quality/duration/timing/severity/associated sxs/prior Treatment) Patient is a 82 y.o. male presenting with chest pain. The history is provided by the patient and the spouse.  Chest Pain Associated symptoms: cough   Associated symptoms: no abdominal pain, no back pain, no fever, no headache, no shortness of breath and not vomiting   Patient c/o right lateral chest pain for the past couple days. Constant, dull, moderate, worse w coughing or certain movements. Patient indicates started with non prod cough, nasal congestion, body aches, 1 week ago.  States was seen in ED and told had probable flu.  States cough persisted, and now w cough, right lateral chest pain. No sob. No fever or chills. No trauma or fall. No leg pain or swelling. No dvt or pe hx. Remote hx cabg, no mid/central cp, or exertional cp.      Past Medical History  Diagnosis Date  . CAD (coronary artery disease)     a. H/o MI;  b. s/p CABG  . HTN (hypertension)   . Hyperlipidemia   . BPH (benign prostatic hypertrophy)   . Sigmoid diverticulitis   . Internal hemorrhoids   . Basal cell carcinoma   . Disequilibrium 04/03/2012  . Moderate aortic stenosis     a. 08/2014 Echo: EF 55-60%, no rwma, Gr 1 DD, mod AS, mildly dil asc Ao, mild MR, sev dil LA.  [name] Syncope     a. 08/2014 - ? orthostatic  . PAF (paroxysmal atrial fibrillation) ([place])     a. Dx 08/2014;  b. CHA2DS2VASc = 4 - decision made to withold [place] 2/2 unsteady gait and syncope.  . Medicare annual wellness visit, subsequent 03/16/2015    Follows with cardiology, Dr Aundra Dubin, Dr Bing Neighbors with dermatology, Dr Altamese Cabal Aged out of colonoscopy   Past Surgical History  Procedure Laterality Date  . Coronary artery bypass graft    . Colonoscopy    . Shoulder  surgery      x2   Family History  Problem Relation Age of Onset  . Coronary artery disease Father   . Hyperlipidemia Father   . Heart attack Father   . Alcohol abuse Father   . Bipolar disorder Mother   . Heart disease Mother   . Alcohol abuse    . Alcohol abuse Brother   . Cancer Brother   . Cancer Maternal Grandfather     oral cancer, chew tobacco  . Cancer Sister   . Glaucoma Sister    Social History  Substance Use Topics  . Smoking status: Former Smoker -- 0.50 packs/day for 15 years    Quit date: 06/29/1971  . Smokeless tobacco: Never Used  . Alcohol Use: Yes     Comment: 1-2 glasses of wine    Review of Systems  Constitutional: Negative for fever.  HENT: Positive for congestion. Negative for sore throat.   Eyes: Negative for redness.  Respiratory: Positive for cough. Negative for shortness of breath.   Cardiovascular: Positive for chest pain. Negative for leg swelling.  Gastrointestinal: Negative for vomiting and abdominal pain.  Genitourinary: Negative for flank pain.  Musculoskeletal: Negative for back pain and neck pain.  Skin: Negative for rash.  Neurological: Negative for headaches.  Hematological: Does not bruise/bleed easily.  Psychiatric/Behavioral: Negative for confusion.  Allergies  Review of patient's allergies indicates no known allergies.  Home Medications   Prior to Admission medications   Medication Sig Start Date End Date Taking? Authorizing Provider  acetaminophen (TYLENOL) 325 MG tablet Take 650 mg by mouth every 6 (six) hours as needed for mild pain or moderate pain.    Historical Provider, MD  apixaban (ELIQUIS) 5 MG TABS tablet Take 1 tablet (5 mg total) by mouth 2 (two) times daily. 07/28/15   Larey Dresser, MD  CARTIA XT 120 MG 24 hr capsule TAKE ONE CAPSULE BY MOUTH ONCE DAILY 08/13/15   Larey Dresser, MD  cholecalciferol (VITAMIN D) 1000 UNITS tablet Take 1,000 Units by mouth daily as needed.     Historical Provider, MD   diphenhydramine-acetaminophen (TYLENOL PM) 25-500 MG TABS Take 1 tablet by mouth at bedtime as needed (insomnia).    Historical Provider, MD  finasteride (PROSCAR) 5 MG tablet Take 1 tablet (5 mg total) by mouth daily. 08/07/13   Mosie Lukes, MD  hydrochlorothiazide (MICROZIDE) 12.5 MG capsule Take 1 capsule (12.5 mg total) by mouth daily. 09/20/14   Carlena Bjornstad, MD  lovastatin (MEVACOR) 20 MG tablet TAKE ONE TABLET BY MOUTH ONCE DAILY AT BEDTIME **PLEASE CALL [phone] FOR AN APPT** 08/28/15   Larey Dresser, MD  MELATONIN PO Take 1 tablet by mouth at bedtime as needed.     Historical Provider, MD  Multiple Vitamin (MULTIVITAMIN) capsule Take 1 capsule by mouth every other day.     Historical Provider, MD  multivitamin-lutein [facility]) CAPS Take 1 capsule by mouth daily.     Historical Provider, MD  Probiotic Product (PROBIOTIC DAILY PO) Take 1 capsule by mouth daily.    Historical Provider, MD   BP 153/79 mmHg  Pulse 64  Temp(Src) 97.8 F (36.6 C) (Oral)  Resp 18  Wt 66.679 kg  SpO2 98% Physical Exam  Constitutional: He is oriented to person, place, and time. He appears well-developed and well-nourished. No distress.  HENT:  Head: Atraumatic.  Mouth/Throat: Oropharynx is clear and moist.  Eyes: Conjunctivae are normal. No scleral icterus.  Neck: Neck supple. No tracheal deviation present.  No stiffness or rigidity  Cardiovascular: Normal rate, regular rhythm, normal heart sounds and intact distal pulses.  Exam reveals no gallop and no friction rub.   No murmur heard. Pulmonary/Chest: Effort normal. No accessory muscle usage. No respiratory distress. He exhibits tenderness.  Coughing, upper resp congestion. Right lateral chest wall tenderness reproducing symptoms. No crepitus.   Abdominal: Soft. Bowel sounds are normal. He exhibits no distension. There is no tenderness.  Genitourinary:  No cva tenderness  Musculoskeletal: Normal range of motion. He exhibits no edema or  tenderness.  Neurological: He is alert and oriented to person, place, and time.  Skin: Skin is warm and dry. He is not diaphoretic.  In area of pain, no vesicular/shingles rash.  Pt does have a few sparse, scattered 1-2 mm erythematous lesions to back, bilateral, and to subcostal area, sl more on right, however no discrete/dermatomal rash in area of patients pain.   Psychiatric: He has a normal mood and affect.  Nursing note and vitals reviewed.   ED Course  Procedures (including critical care time) Labs Review  Results for orders placed or performed during the hospital encounter of 09/25/15  CBC  Result Value Ref Range   WBC 4.3 4.0 - 10.5 K/uL   RBC 4.22 4.22 - 5.81 MIL/uL   Hemoglobin 12.2 (L) 13.0 - 17.0  g/dL   HCT 35.6 (L) 39.0 - 52.0 %   MCV 84.4 78.0 - 100.0 fL   MCH 28.9 26.0 - 34.0 pg   MCHC 34.3 30.0 - 36.0 g/dL   RDW 14.2 11.5 - 15.5 %   Platelets 247 150 - 400 K/uL  Comprehensive metabolic panel  Result Value Ref Range   Sodium 131 (L) 135 - 145 mmol/L   Potassium 4.5 3.5 - 5.1 mmol/L   Chloride 98 (L) 101 - 111 mmol/L   CO2 26 22 - 32 mmol/L   Glucose, Bld 105 (H) 65 - 99 mg/dL   BUN 17 6 - 20 mg/dL   Creatinine, Ser 0.91 0.61 - 1.24 mg/dL   Calcium 8.5 (L) 8.9 - 10.3 mg/dL   Total Protein 6.2 (L) 6.5 - 8.1 g/dL   Albumin 3.2 (L) 3.5 - 5.0 g/dL   AST 37 15 - 41 U/L   ALT 23 17 - 63 U/L   Alkaline Phosphatase 53 38 - 126 U/L   Total Bilirubin 0.5 0.3 - 1.2 mg/dL   GFR calc non Af Amer >60 >60 mL/min   GFR calc Af Amer >60 >60 mL/min   Anion gap 7 5 - 15  Urinalysis, Routine w reflex microscopic (not at [facility])  Result Value Ref Range   Color, Urine YELLOW YELLOW   APPearance CLEAR CLEAR   Specific Gravity, Urine 1.010 1.005 - 1.030   pH 7.0 5.0 - 8.0   Glucose, UA NEGATIVE NEGATIVE mg/dL   Hgb urine dipstick TRACE (A) NEGATIVE   Bilirubin Urine NEGATIVE NEGATIVE   Ketones, ur NEGATIVE NEGATIVE mg/dL   Protein, ur 30 (A) NEGATIVE mg/dL   Nitrite NEGATIVE  NEGATIVE   Leukocytes, UA SMALL (A) NEGATIVE  Urine microscopic-add on  Result Value Ref Range   Squamous Epithelial / LPF 0-5 (A) NONE SEEN   WBC, UA 0-5 0 - 5 WBC/hpf   RBC / HPF 0-5 0 - 5 RBC/hpf   Bacteria, UA MANY (A) NONE SEEN   Urine-Other MUCOUS PRESENT    Dg Chest 2 View  09/25/2015  CLINICAL DATA:  Back pain and cough for 3 days EXAM: CHEST  2 VIEW COMPARISON:  09/22/2015 FINDINGS: Cardiac shadow is stable. Changes of prior granulomatous disease are again noted. No focal infiltrate or sizable effusion is noted. Postsurgical changes are seen. Mild peribronchial cuffing is again identified likely of a chronic nature. A tiny right-sided pleural effusion is now seen. IMPRESSION: Tiny right-sided pleural effusion. No other focal abnormality is seen. Prior granulomatous disease. Electronically Signed   By: Inez Catalina M.D.   On: 09/25/2015 17:42   Dg Chest 2 View  09/22/2015  CLINICAL DATA:  Acute onset of cough and sore throat. Generalized weakness and shortness of breath. Initial encounter. EXAM: CHEST  2 VIEW COMPARISON:  Chest radiograph from 01/05/2005 FINDINGS: The lungs are well-aerated. Peribronchial thickening is noted. There is no evidence of focal opacification, pleural effusion or pneumothorax. Calcified bilateral hilar and mediastinal nodes are again seen. The heart is normal in size; the patient is status post median sternotomy, with evidence of prior CABG. No acute osseous abnormalities are seen. IMPRESSION: 1. No acute cardiopulmonary process seen. 2. Calcified bilateral hilar and mediastinal nodes again seen, reflecting remote granulomatous disease. Peribronchial thickening noted. Electronically Signed   By: Garald Balding M.D.   On: 09/22/2015 03:36     I have personally reviewed and evaluated these images and lab results as part of my medical decision-making.  MDM   Labs. Cxr.  Reviewed nursing notes and prior charts for additional history.   Patient indicates  symptoms started with cough/congestion. Cough persists/increased, and now right lateral/lower pain w coughing spell or sneeze.   Given congestion, rhonchi, will rx abx.   rx doxy. rec tylenol prn.   Recheck, reading book, breathing comfortably. No increased wob. Pulse ox 96% room air.   Pt currently appears stable for d/c.  Return precautions provided.      Lajean Saver, MD 09/25/15 [phone]

## 2015-09-25 NOTE — ED Notes (Signed)
Wife states pt dx with flu Monday-pt cont'd having right side chest pain-pt NAD-slow but steady gait to tx area

## 2015-09-26 ENCOUNTER — Encounter: Payer: Self-pay | Admitting: Internal Medicine

## 2015-09-29 ENCOUNTER — Encounter: Payer: Self-pay | Admitting: Cardiology

## 2015-10-13 ENCOUNTER — Emergency Department (HOSPITAL_COMMUNITY): Payer: PPO

## 2015-10-13 ENCOUNTER — Other Ambulatory Visit: Payer: Self-pay | Admitting: Orthopaedic Surgery

## 2015-10-13 ENCOUNTER — Encounter (HOSPITAL_COMMUNITY): Payer: Self-pay | Admitting: Emergency Medicine

## 2015-10-13 ENCOUNTER — Inpatient Hospital Stay (HOSPITAL_COMMUNITY)
Admission: EM | Admit: 2015-10-13 | Discharge: 2015-10-16 | DRG: 470 | Disposition: A | Discharge status: Skilled Nursing Facility | Payer: PPO | Attending: Internal Medicine | Admitting: Internal Medicine

## 2015-10-13 DIAGNOSIS — Z886 Allergy status to analgesic agent status: Secondary | ICD-10-CM

## 2015-10-13 DIAGNOSIS — S72002A Fracture of unspecified part of neck of left femur, initial encounter for closed fracture: Secondary | ICD-10-CM | POA: Diagnosis not present

## 2015-10-13 DIAGNOSIS — I2510 Atherosclerotic heart disease of native coronary artery without angina pectoris: Secondary | ICD-10-CM | POA: Diagnosis not present

## 2015-10-13 DIAGNOSIS — Z87891 Personal history of nicotine dependence: Secondary | ICD-10-CM | POA: Diagnosis not present

## 2015-10-13 DIAGNOSIS — Z79899 Other long term (current) drug therapy: Secondary | ICD-10-CM | POA: Diagnosis not present

## 2015-10-13 DIAGNOSIS — I252 Old myocardial infarction: Secondary | ICD-10-CM | POA: Diagnosis not present

## 2015-10-13 DIAGNOSIS — N400 Enlarged prostate without lower urinary tract symptoms: Secondary | ICD-10-CM | POA: Diagnosis present

## 2015-10-13 DIAGNOSIS — I951 Orthostatic hypotension: Secondary | ICD-10-CM | POA: Diagnosis not present

## 2015-10-13 DIAGNOSIS — T148 Other injury of unspecified body region: Secondary | ICD-10-CM | POA: Diagnosis not present

## 2015-10-13 DIAGNOSIS — S72002D Fracture of unspecified part of neck of left femur, subsequent encounter for closed fracture with routine healing: Secondary | ICD-10-CM | POA: Diagnosis not present

## 2015-10-13 DIAGNOSIS — W010XXA Fall on same level from slipping, tripping and stumbling without subsequent striking against object, initial encounter: Secondary | ICD-10-CM | POA: Diagnosis not present

## 2015-10-13 DIAGNOSIS — Y93B9 Activity, other involving muscle strengthening exercises: Secondary | ICD-10-CM

## 2015-10-13 DIAGNOSIS — Z01818 Encounter for other preprocedural examination: Secondary | ICD-10-CM | POA: Diagnosis not present

## 2015-10-13 DIAGNOSIS — W19XXXD Unspecified fall, subsequent encounter: Secondary | ICD-10-CM | POA: Diagnosis not present

## 2015-10-13 DIAGNOSIS — E785 Hyperlipidemia, unspecified: Secondary | ICD-10-CM | POA: Diagnosis not present

## 2015-10-13 DIAGNOSIS — I480 Paroxysmal atrial fibrillation: Secondary | ICD-10-CM | POA: Diagnosis not present

## 2015-10-13 DIAGNOSIS — I350 Nonrheumatic aortic (valve) stenosis: Secondary | ICD-10-CM | POA: Diagnosis present

## 2015-10-13 DIAGNOSIS — Z951 Presence of aortocoronary bypass graft: Secondary | ICD-10-CM

## 2015-10-13 DIAGNOSIS — Z8249 Family history of ischemic heart disease and other diseases of the circulatory system: Secondary | ICD-10-CM

## 2015-10-13 DIAGNOSIS — Z85828 Personal history of other malignant neoplasm of skin: Secondary | ICD-10-CM

## 2015-10-13 DIAGNOSIS — I10 Essential (primary) hypertension: Secondary | ICD-10-CM | POA: Diagnosis not present

## 2015-10-13 DIAGNOSIS — Z7901 Long term (current) use of anticoagulants: Secondary | ICD-10-CM

## 2015-10-13 DIAGNOSIS — S72012A Unspecified intracapsular fracture of left femur, initial encounter for closed fracture: Secondary | ICD-10-CM | POA: Diagnosis not present

## 2015-10-13 DIAGNOSIS — G629 Polyneuropathy, unspecified: Secondary | ICD-10-CM | POA: Diagnosis present

## 2015-10-13 DIAGNOSIS — S72092A Other fracture of head and neck of left femur, initial encounter for closed fracture: Secondary | ICD-10-CM | POA: Diagnosis not present

## 2015-10-13 DIAGNOSIS — Z043 Encounter for examination and observation following other accident: Secondary | ICD-10-CM | POA: Diagnosis not present

## 2015-10-13 DIAGNOSIS — W19XXXA Unspecified fall, initial encounter: Secondary | ICD-10-CM | POA: Insufficient documentation

## 2015-10-13 DIAGNOSIS — M25552 Pain in left hip: Secondary | ICD-10-CM | POA: Diagnosis not present

## 2015-10-13 DIAGNOSIS — I25810 Atherosclerosis of coronary artery bypass graft(s) without angina pectoris: Secondary | ICD-10-CM | POA: Diagnosis not present

## 2015-10-13 DIAGNOSIS — Z9861 Coronary angioplasty status: Secondary | ICD-10-CM

## 2015-10-13 LAB — PROTIME-INR
INR: 1.35 (ref 0.00–1.49)
Prothrombin Time: 16.8 seconds — ABNORMAL HIGH (ref 11.6–15.2)

## 2015-10-13 LAB — BASIC METABOLIC PANEL
ANION GAP: 7 (ref 5–15)
BUN: 16 mg/dL (ref 6–20)
CO2: 26 mmol/L (ref 22–32)
Calcium: 8.9 mg/dL (ref 8.9–10.3)
Chloride: 100 mmol/L — ABNORMAL LOW (ref 101–111)
Creatinine, Ser: 0.97 mg/dL (ref 0.61–1.24)
GLUCOSE: 107 mg/dL — AB (ref 65–99)
Potassium: 4.5 mmol/L (ref 3.5–5.1)
SODIUM: 133 mmol/L — AB (ref 135–145)

## 2015-10-13 LAB — CBC WITH DIFFERENTIAL/PLATELET
BASOS ABS: 0 10*3/uL (ref 0.0–0.1)
Basophils Relative: 1 %
Eosinophils Absolute: 0.1 10*3/uL (ref 0.0–0.7)
Eosinophils Relative: 2 %
HEMATOCRIT: 39.1 % (ref 39.0–52.0)
Hemoglobin: 13.2 g/dL (ref 13.0–17.0)
LYMPHS PCT: 14 %
Lymphs Abs: 1 10*3/uL (ref 0.7–4.0)
MCH: 28.4 pg (ref 26.0–34.0)
MCHC: 33.8 g/dL (ref 30.0–36.0)
MCV: 84.1 fL (ref 78.0–100.0)
Monocytes Absolute: 0.8 10*3/uL (ref 0.1–1.0)
Monocytes Relative: 11 %
NEUTROS ABS: 5.3 10*3/uL (ref 1.7–7.7)
Neutrophils Relative %: 72 %
Platelets: 421 10*3/uL — ABNORMAL HIGH (ref 150–400)
RBC: 4.65 MIL/uL (ref 4.22–5.81)
RDW: 14.6 % (ref 11.5–15.5)
WBC: 7.2 10*3/uL (ref 4.0–10.5)

## 2015-10-13 LAB — ABO/RH: ABO/RH(D): O NEG

## 2015-10-13 LAB — TYPE AND SCREEN
ABO/RH(D): O NEG
ANTIBODY SCREEN: NEGATIVE

## 2015-10-13 MED ORDER — METOPROLOL TARTRATE 25 MG PO TABS
25.0000 mg | ORAL_TABLET | Freq: Two times a day (BID) | ORAL | Status: DC
  Administered 2015-10-13 – 2015-10-16 (×6): 25 mg via ORAL
  Filled 2015-10-13 (×6): qty 1

## 2015-10-13 MED ORDER — DILTIAZEM HCL ER COATED BEADS 120 MG PO CP24
120.0000 mg | ORAL_CAPSULE | Freq: Every day | ORAL | Status: DC
  Administered 2015-10-13 – 2015-10-16 (×4): 120 mg via ORAL
  Filled 2015-10-13 (×3): qty 1

## 2015-10-13 MED ORDER — ADULT MULTIVITAMIN W/MINERALS CH
1.0000 | ORAL_TABLET | Freq: Every day | ORAL | Status: DC
  Administered 2015-10-13 – 2015-10-16 (×3): 1 via ORAL
  Filled 2015-10-13 (×3): qty 1

## 2015-10-13 MED ORDER — OCUVITE-LUTEIN PO CAPS: 1.0000 | ORAL_CAPSULE | Freq: Every day | ORAL | Status: DC

## 2015-10-13 MED ORDER — SENNA 8.6 MG PO TABS
1.0000 | ORAL_TABLET | Freq: Two times a day (BID) | ORAL | Status: DC
  Administered 2015-10-13 – 2015-10-16 (×5): 8.6 mg via ORAL
  Filled 2015-10-13 (×5): qty 1

## 2015-10-13 MED ORDER — MELATONIN 3 MG PO TABS
3.0000 mg | ORAL_TABLET | Freq: Every day | ORAL | Status: DC
  Administered 2015-10-14 – 2015-10-15 (×2): 3 mg via ORAL
  Filled 2015-10-13 (×4): qty 1

## 2015-10-13 MED ORDER — DIPHENHYDRAMINE-APAP (SLEEP) 25-500 MG PO TABS: 1.0000 | ORAL_TABLET | Freq: Every evening | ORAL | Status: DC | PRN

## 2015-10-13 MED ORDER — MULTIVITAMINS PO CAPS: 1.0000 | ORAL_CAPSULE | Freq: Every day | ORAL | Status: DC

## 2015-10-13 MED ORDER — DEXTROSE 5 % IV SOLN
3.0000 g | INTRAVENOUS | Status: AC
  Administered 2015-10-14: 3 g via INTRAVENOUS
  Filled 2015-10-13 (×2): qty 3000

## 2015-10-13 MED ORDER — POVIDONE-IODINE 10 % EX SWAB: 2.0000 "application " | Freq: Once | CUTANEOUS | Status: DC

## 2015-10-13 MED ORDER — MORPHINE SULFATE (PF) 2 MG/ML IV SOLN
0.5000 mg | INTRAVENOUS | Status: DC | PRN
  Administered 2015-10-13 – 2015-10-14 (×7): 0.5 mg via INTRAVENOUS
  Filled 2015-10-13 (×7): qty 1

## 2015-10-13 MED ORDER — FLORA-Q PO CAPS
1.0000 | ORAL_CAPSULE | Freq: Every day | ORAL | Status: DC
  Administered 2015-10-13: 1 via ORAL
  Filled 2015-10-13 (×3): qty 1

## 2015-10-13 MED ORDER — HYDROCODONE-ACETAMINOPHEN 5-325 MG PO TABS
1.0000 | ORAL_TABLET | Freq: Four times a day (QID) | ORAL | Status: DC | PRN
  Administered 2015-10-13: 2 via ORAL
  Filled 2015-10-13: qty 2

## 2015-10-13 MED ORDER — ACETAMINOPHEN 325 MG PO TABS: 325.0000 mg | ORAL_TABLET | Freq: Four times a day (QID) | ORAL | Status: DC | PRN

## 2015-10-13 MED ORDER — HYDROCHLOROTHIAZIDE 12.5 MG PO CAPS
12.5000 mg | ORAL_CAPSULE | Freq: Every day | ORAL | Status: DC
  Administered 2015-10-15 – 2015-10-16 (×2): 12.5 mg via ORAL
  Filled 2015-10-13 (×2): qty 1

## 2015-10-13 MED ORDER — APIXABAN 5 MG PO TABS: 5.0000 mg | ORAL_TABLET | Freq: Two times a day (BID) | ORAL | Status: DC

## 2015-10-13 MED ORDER — FENTANYL CITRATE (PF) 100 MCG/2ML IJ SOLN
50.0000 ug | Freq: Once | INTRAMUSCULAR | Status: AC
  Administered 2015-10-13: 50 ug via INTRAVENOUS
  Filled 2015-10-13: qty 2

## 2015-10-13 MED ORDER — PRAVASTATIN SODIUM 20 MG PO TABS
10.0000 mg | ORAL_TABLET | Freq: Every day | ORAL | Status: DC
  Administered 2015-10-13 – 2015-10-15 (×2): 10 mg via ORAL
  Filled 2015-10-13 (×2): qty 1

## 2015-10-13 MED ORDER — LACTATED RINGERS IV SOLN
INTRAVENOUS | Status: DC
  Administered 2015-10-13: 23:00:00 via INTRAVENOUS

## 2015-10-13 MED ORDER — FENTANYL CITRATE (PF) 100 MCG/2ML IJ SOLN
50.0000 ug | INTRAMUSCULAR | Status: DC | PRN
  Administered 2015-10-13: 50 ug via INTRAVENOUS
  Filled 2015-10-13: qty 2

## 2015-10-13 MED ORDER — CHLORHEXIDINE GLUCONATE 4 % EX LIQD
60.0000 mL | Freq: Once | CUTANEOUS | Status: AC
  Administered 2015-10-14: 4 via TOPICAL
  Filled 2015-10-13: qty 60

## 2015-10-13 MED ORDER — VITAMIN D 1000 UNITS PO TABS
1000.0000 [IU] | ORAL_TABLET | Freq: Every day | ORAL | Status: DC
  Administered 2015-10-15 – 2015-10-16 (×2): 1000 [IU] via ORAL
  Filled 2015-10-13 (×2): qty 1

## 2015-10-13 NOTE — ED Notes (Signed)
[facility] called  

## 2015-10-13 NOTE — Consult Note (Signed)
CARDIOLOGY CONSULT NOTE  Patient ID: Justin [name], MRN: [id number], DOB/AGE: [birth year] 80 y.o. Admit date: 10/13/2015 Date of Consult: 10/13/2015  Primary Physician: [name], MD Primary Cardiologist: Dr Aundra Dubin Referring Physician: Dr Clementeen Graham  Chief Complaint: hip pain Reason for Consultation: preoperative cardiac assessment  HPI:  82 yo male with CAD s/p CABG and moderate AS, suffered a mechanical fall and left hip fracture which will require operative repair. He is followed by Dr Aundra Dubin and has been stable from a cardiac perspective.  The patient exercises regularly without exertional symptoms. He lifts weights for up to 35 minutes and walks over one mile at least twice per week. He specifically denies chest pain, dyspnea, orthopnea, PND, or palpitations. He does admit to occasional lightheadedness.   Last echo one year ago showed moderate aortic stenosis with a mean gradient of 19 mmHg. One year follow-up is planned.   Medical History:  Past Medical History  Diagnosis Date  . CAD (coronary artery disease)     a. H/o MI;  b. s/p CABG  . HTN (hypertension)   . Hyperlipidemia   . BPH (benign prostatic hypertrophy)   . Sigmoid diverticulitis   . Internal hemorrhoids   . Basal cell carcinoma   . Disequilibrium 04/03/2012  . Moderate aortic stenosis     a. 08/2014 Echo: EF 55-60%, no rwma, Gr 1 DD, mod AS, mildly dil asc Ao, mild MR, sev dil LA.  [name] Syncope     a. 08/2014 - ? orthostatic  . PAF (paroxysmal atrial fibrillation) ([place])     a. Dx 08/2014;  b. CHA2DS2VASc = 4 - decision made to withold [place] 2/2 unsteady gait and syncope.  . Medicare annual wellness visit, subsequent 03/16/2015    Follows with cardiology, Dr Aundra Dubin, Dr Bing Neighbors with dermatology, Dr Altamese Cabal Aged out of colonoscopy      Surgical History:  Past Surgical History  Procedure Laterality Date  . Coronary artery bypass graft    . Colonoscopy    . Shoulder surgery      x2     Home Meds: Prior  to Admission medications   Medication Sig Start Date End Date Taking? Authorizing Provider  acetaminophen (TYLENOL) 325 MG tablet Take 325 mg by mouth every 6 (six) hours as needed for mild pain or moderate pain.    Yes Historical Provider, MD  apixaban (ELIQUIS) 5 MG TABS tablet Take 1 tablet (5 mg total) by mouth 2 (two) times daily. 07/28/15  Yes Larey Dresser, MD  CARTIA XT 120 MG 24 hr capsule TAKE ONE CAPSULE BY MOUTH ONCE DAILY 08/13/15  Yes Larey Dresser, MD  cholecalciferol (VITAMIN D) 1000 UNITS tablet Take 1,000 Units by mouth daily.    Yes Historical Provider, MD  diphenhydramine-acetaminophen (TYLENOL PM) 25-500 MG TABS Take 1 tablet by mouth at bedtime as needed (insomnia).   Yes Historical Provider, MD  lovastatin (MEVACOR) 20 MG tablet TAKE ONE TABLET BY MOUTH ONCE DAILY AT BEDTIME **PLEASE CALL [phone] FOR AN APPT** 08/28/15  Yes Larey Dresser, MD  Melatonin 3 MG TABS Take 3 mg by mouth at bedtime.   Yes Historical Provider, MD  Multiple Vitamin (MULTIVITAMIN) capsule Take 0.5 capsules by mouth daily.    Yes Historical Provider, MD  multivitamin-lutein (OCUVITE-LUTEIN) CAPS Take 1 capsule by mouth daily.    Yes Historical Provider, MD  Probiotic Product (PROBIOTIC DAILY PO) Take 1 capsule by mouth daily.   Yes Historical Provider, MD  doxycycline (VIBRAMYCIN)  100 MG capsule Take 1 capsule (100 mg total) by mouth 2 (two) times daily. 09/25/15   Lajean Saver, MD  hydrochlorothiazide (MICROZIDE) 12.5 MG capsule Take 1 capsule (12.5 mg total) by mouth daily. 09/20/14   Carlena Bjornstad, MD    Inpatient Medications:  . [START ON 10/14/2015] cholecalciferol  1,000 Units Oral Daily  . diltiazem  120 mg Oral Daily  . FLORA-Q  1 capsule Oral Daily  . [START ON 10/14/2015] hydrochlorothiazide  12.5 mg Oral Daily  . Melatonin  3 mg Oral QHS  . metoprolol tartrate  25 mg Oral BID  . multivitamin with minerals  1 tablet Oral Daily  . pravastatin  10 mg Oral q1800  . senna  1 tablet Oral  BID      Allergies:  Allergies  Allergen Reactions  . Tramadol Hcl Other (See Comments)    Hallucinations     Social History   Social History  . Marital Status: Married    Spouse Name: N/A  . Number of Children: 2  . Years of Education: N/A   Occupational History  . 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 Smoking status: Former Smoker -- 0.50 packs/day for 15 years    Quit date: 06/29/1971  . Smokeless tobacco: Never Used  . Alcohol Use: Yes     Comment: 1-2 glasses of wine  . Drug Use: No  . Sexual Activity: No     Comment: lives with wife, retired English as a second language teacher, no dietary restrictions   Other Topics Concern  . Not on file   Social History Narrative     Family History  Problem Relation Age of Onset  . Coronary artery disease Father   . Hyperlipidemia Father   . Heart attack Father   . Alcohol abuse Father   . Bipolar disorder Mother   . Heart disease Mother   . Alcohol abuse    . Alcohol abuse Brother   . Cancer Brother   . Cancer Maternal Grandfather     oral cancer, chew tobacco  . Cancer Sister   . Glaucoma Sister      Review of Systems: General: negative for chills, fever, night sweats or weight changes.  ENT: negative for rhinorrhea or epistaxis Cardiovascular: negative for chest pain, shortness of breath, dyspnea on exertion, edema, orthopnea, palpitations, or paroxysmal nocturnal dyspnea Dermatological: negative for rash Respiratory: negative for cough or wheezing GI: negative for nausea, vomiting, diarrhea, bright red blood per rectum, melena, or hematemesis GU: no hematuria, urgency, or frequency Neurologic: negative for visual changes, syncope, headache Heme: no easy bruising or bleeding Endo: negative for excessive thirst, thyroid disorder, or flushing  All other systems reviewed and are otherwise negative except as noted above.  Physical Exam: Blood pressure 173/84, pulse 74, temperature  97.8 F (36.6 C), temperature source Oral, resp. rate 16, height 5\' 5"  (1.651 m), weight 136 lb (61.689 kg), SpO2 99 %. Pt is alert and oriented, WD, WN, in no distress. HEENT: normal Neck: JVP normal. Carotid upstrokes normal without bruits. No thyromegaly. Lungs: equal expansion, clear bilaterally CV: Apex is discrete and nondisplaced, RRR with 2/6 harsh systolic murmur at the RUSB, preserved A2, no diastolic murmur Abd: soft, NT, +BS, no bruit, no hepatosplenomegaly Back: no CVA tenderness Ext: no C/C/E        DP/PT pulses intact and = Skin: warm and dry without rash Neuro: CNII-XII intact  Strength intact = bilaterally    Labs: No results for input(s): CKTOTAL, CKMB, TROPONINI in the last 72 hours. Lab Results  Component Value Date   WBC 7.2 10/13/2015   HGB 13.2 10/13/2015   HCT 39.1 10/13/2015   MCV 84.1 10/13/2015   PLT 421* 10/13/2015    Recent Labs Lab 10/13/15 1315  NA 133*  K 4.5  CL 100*  CO2 26  BUN 16  CREATININE 0.97  CALCIUM 8.9  GLUCOSE 107*   Lab Results  Component Value Date   CHOL 170 02/28/2015   HDL 96 02/28/2015   LDLCALC 64 02/28/2015   TRIG 48 02/28/2015   No results found for: DDIMER  Radiology/Studies:  Dg Chest 1 View  10/13/2015  CLINICAL DATA:  Left hip fracture.  Pre operative respiratory exam. EXAM: CHEST 1 VIEW COMPARISON:  09/25/2015 FINDINGS: The heart size and pulmonary vascularity are normal. There arm multiple calcified lymph nodes in the mediastinum and hilar regions as well as at the lung bases. Slight linear scarring at the left base laterally. The lungs are otherwise clear. Fairly severe degenerative changes of both shoulders. CABG. IMPRESSION: No acute abnormalities. Electronically Signed   By: Lorriane Shire M.D.   On: 10/13/2015 13:45   Dg Chest 2 View  09/25/2015  CLINICAL DATA:  Back pain and cough for 3 days EXAM: CHEST  2 VIEW COMPARISON:  09/22/2015 FINDINGS: Cardiac shadow is stable. Changes of prior  granulomatous disease are again noted. No focal infiltrate or sizable effusion is noted. Postsurgical changes are seen. Mild peribronchial cuffing is again identified likely of a chronic nature. A tiny right-sided pleural effusion is now seen. IMPRESSION: Tiny right-sided pleural effusion. No other focal abnormality is seen. Prior granulomatous disease. Electronically Signed   By: Inez Catalina M.D.   On: 09/25/2015 17:42   Dg Chest 2 View  09/22/2015  CLINICAL DATA:  Acute onset of cough and sore throat. Generalized weakness and shortness of breath. Initial encounter. EXAM: CHEST  2 VIEW COMPARISON:  Chest radiograph from 01/05/2005 FINDINGS: The lungs are well-aerated. Peribronchial thickening is noted. There is no evidence of focal opacification, pleural effusion or pneumothorax. Calcified bilateral hilar and mediastinal nodes are again seen. The heart is normal in size; the patient is status post median sternotomy, with evidence of prior CABG. No acute osseous abnormalities are seen. IMPRESSION: 1. No acute cardiopulmonary process seen. 2. Calcified bilateral hilar and mediastinal nodes again seen, reflecting remote granulomatous disease. Peribronchial thickening noted. Electronically Signed   By: Garald Balding M.D.   On: 09/22/2015 03:36   Dg Hip Unilat With Pelvis 2-3 Views Left  10/13/2015  CLINICAL DATA:  Left hip pain after a fall while exercising. EXAM: DG HIP (WITH OR WITHOUT PELVIS) 2-3V LEFT COMPARISON:  None. FINDINGS: There is a subcapital fracture of the proximal right femur with slight proximal retraction of the distal fragment. The pelvic bones are intact. Extensive vascular calcification. IMPRESSION: Slightly retracted subcapital fracture of the proximal left femur. Electronically Signed   By: Lorriane Shire M.D.   On: 10/13/2015 13:44    EKG: NSR with left axis deviation  Cardiac Studies: 2D Echo 09/09/2014  ASSESSMENT AND PLAN:  82 year old male with stable ischemic heart disease,  normal LV function, and moderate aortic stenosis. The patient is highly functional and is clearly able to achieve > mets physical activity without cardiac symptoms. There are no signs of angina, heart failure, or symptomatic arrhythmia. Clearly the most significant morbidity in  this situation is related to hip fracture and immobility related to the injury. I think the patient can proceed with surgery tomorrow as planned without further cardiac workup. Our team will be available if any problems arise and will help with post-op management as needed.   SignedSherren Mocha MD, [facility] 10/13/2015, 9:34 PM

## 2015-10-13 NOTE — ED Provider Notes (Signed)
CSN: [id number]     Arrival date & time 10/13/15  1238 History   First MD Initiated Contact with Patient 10/13/15 1251     Chief Complaint  Patient presents with  . Fall  . Hip deformity      HPI Per EMS pt was exercising with exercising ball and fell onto LEFT hip. Left leg is shortened and rotated outward. Strong left pedal pulse. Past Medical History  Diagnosis Date  . CAD (coronary artery disease)     a. H/o MI;  b. s/p CABG  . HTN (hypertension)   . Hyperlipidemia   . BPH (benign prostatic hypertrophy)   . Sigmoid diverticulitis   . Internal hemorrhoids   . Basal cell carcinoma   . Disequilibrium 04/03/2012  . Moderate aortic stenosis     a. 08/2014 Echo: EF 55-60%, no rwma, Gr 1 DD, mod AS, mildly dil asc Ao, mild MR, sev dil LA.  [name] Syncope     a. 08/2014 - ? orthostatic  . PAF (paroxysmal atrial fibrillation) ([place])     a. Dx 08/2014;  b. CHA2DS2VASc = 4 - decision made to withold [place] 2/2 unsteady gait and syncope.  . Medicare annual wellness visit, subsequent 03/16/2015    Follows with cardiology, Dr Aundra Dubin, Dr Bing Neighbors with dermatology, Dr Altamese Cabal Aged out of colonoscopy   Past Surgical History  Procedure Laterality Date  . Coronary artery bypass graft    . Colonoscopy    . Shoulder surgery      x2   Family History  Problem Relation Age of Onset  . Coronary artery disease Father   . Hyperlipidemia Father   . Heart attack Father   . Alcohol abuse Father   . Bipolar disorder Mother   . Heart disease Mother   . Alcohol abuse    . Alcohol abuse Brother   . Cancer Brother   . Cancer Maternal Grandfather     oral cancer, chew tobacco  . Cancer Sister   . Glaucoma Sister    Social History  Substance Use Topics  . Smoking status: Former Smoker -- 0.50 packs/day for 15 years    Quit date: 06/29/1971  . Smokeless tobacco: Never Used  . Alcohol Use: Yes     Comment: 1-2 glasses of wine    Review of Systems  All other systems reviewed and are  negative.     Allergies  Tramadol hcl  Home Medications   Prior to Admission medications   Medication Sig Start Date End Date Taking? Authorizing Provider  acetaminophen (TYLENOL) 325 MG tablet Take 325 mg by mouth every 6 (six) hours as needed for mild pain or moderate pain.    Yes Historical Provider, MD  apixaban (ELIQUIS) 5 MG TABS tablet Take 1 tablet (5 mg total) by mouth 2 (two) times daily. 07/28/15  Yes Larey Dresser, MD  CARTIA XT 120 MG 24 hr capsule TAKE ONE CAPSULE BY MOUTH ONCE DAILY 08/13/15  Yes Larey Dresser, MD  cholecalciferol (VITAMIN D) 1000 UNITS tablet Take 1,000 Units by mouth daily.    Yes Historical Provider, MD  diphenhydramine-acetaminophen (TYLENOL PM) 25-500 MG TABS Take 1 tablet by mouth at bedtime as needed (insomnia).   Yes Historical Provider, MD  lovastatin (MEVACOR) 20 MG tablet TAKE ONE TABLET BY MOUTH ONCE DAILY AT BEDTIME **PLEASE CALL [phone] FOR AN APPT** 08/28/15  Yes Larey Dresser, MD  Melatonin 3 MG TABS Take 3 mg by mouth at bedtime.   Yes  Historical Provider, MD  Multiple Vitamin (MULTIVITAMIN) capsule Take 0.5 capsules by mouth daily.    Yes Historical Provider, MD  multivitamin-lutein (OCUVITE-LUTEIN) CAPS Take 1 capsule by mouth daily.    Yes Historical Provider, MD  Probiotic Product (PROBIOTIC DAILY PO) Take 1 capsule by mouth daily.   Yes Historical Provider, MD  doxycycline (VIBRAMYCIN) 100 MG capsule Take 1 capsule (100 mg total) by mouth 2 (two) times daily. 09/25/15   Lajean Saver, MD  hydrochlorothiazide (MICROZIDE) 12.5 MG capsule Take 1 capsule (12.5 mg total) by mouth daily. 09/20/14   Carlena Bjornstad, MD   BP 163/93 mmHg  Pulse 64  Resp 16  SpO2 100% Physical Exam  Constitutional: He is oriented to person, place, and time. He appears well-developed and well-nourished. No distress.  HENT:  Head: Normocephalic and atraumatic.  Eyes: Pupils are equal, round, and reactive to light.  Neck: Normal range of motion.   Cardiovascular: Normal rate and intact distal pulses.   Pulmonary/Chest: No respiratory distress.  Abdominal: Normal appearance. He exhibits no distension.  Musculoskeletal: He exhibits tenderness.       Left hip: He exhibits decreased range of motion, tenderness and deformity.       Legs: Neurological: He is alert and oriented to person, place, and time. No cranial nerve deficit.  Skin: Skin is warm and dry. No rash noted.  Psychiatric: He has a normal mood and affect. His behavior is normal.  Nursing note and vitals reviewed.   ED Course  Procedures (including critical care time) Labs Review Labs Reviewed  BASIC METABOLIC PANEL - Abnormal; Notable for the following:    Sodium 133 (*)    Chloride 100 (*)    Glucose, Bld 107 (*)    All other components within normal limits  CBC WITH DIFFERENTIAL/PLATELET - Abnormal; Notable for the following:    Platelets 421 (*)    All other components within normal limits  PROTIME-INR - Abnormal; Notable for the following:    Prothrombin Time 16.8 (*)    All other components within normal limits  TYPE AND SCREEN  ABO/RH    Imaging Review Dg Chest 1 View  10/13/2015  CLINICAL DATA:  Left hip fracture.  Pre operative respiratory exam. EXAM: CHEST 1 VIEW COMPARISON:  09/25/2015 FINDINGS: The heart size and pulmonary vascularity are normal. There arm multiple calcified lymph nodes in the mediastinum and hilar regions as well as at the lung bases. Slight linear scarring at the left base laterally. The lungs are otherwise clear. Fairly severe degenerative changes of both shoulders. CABG. IMPRESSION: No acute abnormalities. Electronically Signed   By: Lorriane Shire M.D.   On: 10/13/2015 13:45   Dg Hip Unilat With Pelvis 2-3 Views Left  10/13/2015  CLINICAL DATA:  Left hip pain after a fall while exercising. EXAM: DG HIP (WITH OR WITHOUT PELVIS) 2-3V LEFT COMPARISON:  None. FINDINGS: There is a subcapital fracture of the proximal right femur with  slight proximal retraction of the distal fragment. The pelvic bones are intact. Extensive vascular calcification. IMPRESSION: Slightly retracted subcapital fracture of the proximal left femur. Electronically Signed   By: Lorriane Shire M.D.   On: 10/13/2015 13:44   I have personally reviewed and evaluated these images and lab results as part of my medical decision-making.   EKG Interpretation   Date/Time:  Monday October 13 2015 14:28:37 EDT Ventricular Rate:  64 PR Interval:  189 QRS Duration: 94 QT Interval:  419 QTC Calculation: 432 R Axis:   -  62 Text Interpretation:  Sinus rhythm Left anterior fascicular block  Borderline T wave abnormalities Confirmed by Audie Pinto  MD, Appollonia Klee ([id number]) on  10/13/2015 2:41:16 PM     Discussed with Dr. Novella Olive, from [place].  Patient is to be transferred to [facility] under the hospitalist service. MDM   Final diagnoses:  Justin Galas, MD 10/13/15 1444

## 2015-10-13 NOTE — Consult Note (Signed)
Justin Nakayama, MD  Chauncey Cruel, PA-C  Loni Dolly, PA-C                                  [facility]                [address] [place], [place]  [address]   Justin [name]            MRN:  [id number] DOB/SEX:  05/04/1929/male     CHIEF COMPLAINT:  Painful left hip  HISTORY: Justin Wolaver Riceis a 82 y.o. male with history of CAD and heart issues who fell ironically in a balance class today and could not get up.  Taken to ED via ambulance.  Justin [name] and his wife, Justin [name], are well known to me.  Admitted to medicine and ORS consulted about hip fracture management.   PAST MEDICAL HISTORY: Patient Active Problem List   Diagnosis Date Noted  . Closed left hip fracture ([place]) 10/13/2015  . Medicare annual wellness visit, subsequent 03/16/2015  . Paroxysmal atrial fibrillation ([place]) 09/26/2014  . Orthostatic hypotension 09/15/2014  . Syncope   . PAF (paroxysmal atrial fibrillation) ([place])   . Head trauma 09/14/2014  . Alteration in anticoagulation 09/14/2014  . Tinnitus 03/21/2014  . Epigastric pain 02/01/2013  . Gait instability 01/01/2013  . Disequilibrium 04/03/2012  . Hyperglycemia 09/13/2011  . Aortic stenosis 03/10/2011  . CAD, ARTERY BYPASS GRAFT 02/23/2010  . Hyperlipidemia 07/04/2008  . Essential hypertension 07/04/2008  . Coronary atherosclerosis 07/04/2008  . RHINITIS 07/04/2008  . BENIGN PROSTATIC HYPERTROPHY, WITH OBSTRUCTION 07/04/2008   Past Medical History  Diagnosis Date  . CAD (coronary artery disease)     a. H/o MI;  b. s/p CABG  . HTN (hypertension)   . Hyperlipidemia   . BPH (benign prostatic hypertrophy)   . Sigmoid diverticulitis   . Internal hemorrhoids   . Basal cell carcinoma   . Disequilibrium 04/03/2012  . Moderate aortic stenosis     a. 08/2014 Echo: EF 55-60%, no rwma, Gr 1 DD, mod AS, mildly dil asc Ao, mild MR, sev dil LA.  [name] Syncope     a. 08/2014 - ? orthostatic  . PAF (paroxysmal atrial fibrillation)  ([place])     a. Dx 08/2014;  b. CHA2DS2VASc = 4 - decision made to withold [place] 2/2 unsteady gait and syncope.  . Medicare annual wellness visit, subsequent 03/16/2015    Follows with cardiology, Dr Aundra Dubin, Dr Bing Neighbors with dermatology, Dr Altamese Cabal Aged out of colonoscopy   Past Surgical History  Procedure Laterality Date  . Coronary artery bypass graft    . Colonoscopy    . Shoulder surgery      x2     MEDICATIONS:   Current facility-administered medications:  .  acetaminophen (TYLENOL) tablet 325 mg, 325 mg, Oral, Q6H PRN, Louellen Molder, MD .  Derrill Memo ON 10/14/2015] cholecalciferol (VITAMIN D) tablet 1,000 Units, 1,000 Units, Oral, Daily, Nishant Dhungel, MD .  diltiazem (CARDIZEM CD) 24 hr capsule 120 mg, 120 mg, Oral, Daily, Nishant Dhungel, MD .  fentaNYL (SUBLIMAZE) injection 50 mcg, 50 mcg, Intravenous, Q30 min PRN, Leonard Schwartz, MD, 50 mcg at 10/13/15 1447 .  FLORA-Q (FLORA-Q) Capsule 1 capsule, 1 capsule, Oral, Daily, Nishant Dhungel, MD .  Derrill Memo ON 10/14/2015] hydrochlorothiazide (MICROZIDE) capsule 12.5 mg, 12.5 mg, Oral, Daily, Nishant Dhungel, MD .  HYDROcodone-acetaminophen (NORCO/VICODIN) 5-325 MG per tablet 1-2  tablet, 1-2 tablet, Oral, Q6H PRN, Nishant Dhungel, MD .  Melatonin TABS 3 mg, 3 mg, Oral, QHS, Nishant Dhungel, MD .  metoprolol tartrate (LOPRESSOR) tablet 25 mg, 25 mg, Oral, BID, Nishant Dhungel, MD .  morphine 2 MG/ML injection 0.5 mg, 0.5 mg, Intravenous, Q2H PRN, Nishant Dhungel, MD, 0.5 mg at 10/13/15 1634 .  multivitamin with minerals tablet 1 tablet, 1 tablet, Oral, Daily, Nishant Dhungel, MD .  pravastatin (PRAVACHOL) tablet 10 mg, 10 mg, Oral, q1800, Nishant Dhungel, MD .  senna (SENOKOT) tablet 8.6 mg, 1 tablet, Oral, BID, Nishant Dhungel, MD  ALLERGIES:   Allergies  Allergen Reactions  . Tramadol Hcl Other (See Comments)    Hallucinations     REVIEW OF SYSTEMS: REVIEWED IN DETAIL IN CHART  FAMILY HISTORY:   Family History  Problem  Relation Age of Onset  . Coronary artery disease Father   . Hyperlipidemia Father   . Heart attack Father   . Alcohol abuse Father   . Bipolar disorder Mother   . Heart disease Mother   . Alcohol abuse    . Alcohol abuse Brother   . Cancer Brother   . Cancer Maternal Grandfather     oral cancer, chew tobacco  . Cancer Sister   . Glaucoma Sister     SOCIAL HISTORY:   Social History  Substance Use Topics  . Smoking status: Former Smoker -- 0.50 packs/day for 15 years    Quit date: 06/29/1971  . Smokeless tobacco: Never Used  . Alcohol Use: Yes     Comment: 1-2 glasses of wine     EXAMINATION: Vital signs in last 24 hours: Temp:  [97.8 F (36.6 C)-97.9 F (36.6 C)] 97.8 F (36.6 C) (04/17 2002) Pulse Rate:  [64-74] 74 (04/17 2002) Resp:  [13-16] 16 (04/17 2002) BP: (128-173)/(75-93) 173/84 mmHg (04/17 2002) SpO2:  [98 %-100 %] 99 % (04/17 2002) Weight:  [136 lb (61.689 kg)] 136 lb (61.689 kg) (04/17 1723)  BP 173/84 mmHg  Pulse 74  Temp(Src) 97.8 F (36.6 C) (Oral)  Resp 16  Ht 5\' 5"  (1.651 m)  Wt 136 lb (61.689 kg)  BMI 22.63 kg/m2  SpO2 99%  General Appearance:    Alert, cooperative, no distress, appears stated age  Head:    Normocephalic, without obvious abnormality, atraumatic  Eyes:    PERRL, conjunctiva/corneas clear, EOM's intact, fundi    benign, both eyes       Ears:    Normal TM's and external ear canals, both ears  Nose:   Nares normal, septum midline, mucosa normal, no drainage    or sinus tenderness  Throat:   Lips, mucosa, and tongue normal; teeth and gums normal  Neck:   Supple, symmetrical, trachea midline, no adenopathy;       thyroid:  No enlargement/tenderness/nodules; no carotid   bruit or JVD  Back:     Symmetric, no curvature, ROM normal, no CVA tenderness  Lungs:     Clear to auscultation bilaterally, respirations unlabored  Chest wall:    No tenderness or deformity  Heart:    Regular rate and rhythm, S1 and S2 normal, no murmur, rub    or gallop  Abdomen:     Soft, non-tender, bowel sounds active all four quadrants,    no masses, no organomegaly  Genitalia:    Rectal:    Extremities:   Extremities normal, atraumatic, no cyanosis or edema  Pulses:   2+ and symmetric all extremities  Skin:  Skin color, texture, turgor normal, no rashes or lesions  Lymph nodes:   Cervical, supraclavicular, and axillary nodes normal  Neurologic:   CNII-XII intact. Normal strength, sensation and reflexes      throughout    Musculoskeletal Exam:   Left leg is SER. Other three extremities FROM and no pain   DIAGNOSTIC STUDIES: Recent laboratory studies:  Recent Labs  10/13/15 1315  WBC 7.2  HGB 13.2  HCT 39.1  PLT 421*    Recent Labs  10/13/15 1315  NA 133*  K 4.5  CL 100*  CO2 26  BUN 16  CREATININE 0.97  GLUCOSE 107*  CALCIUM 8.9   Lab Results  Component Value Date   INR 1.35 10/13/2015     Recent Radiographic Studies :  Dg Chest 1 View  10/13/2015  CLINICAL DATA:  Left hip fracture.  Pre operative respiratory exam. EXAM: CHEST 1 VIEW COMPARISON:  09/25/2015 FINDINGS: The heart size and pulmonary vascularity are normal. There arm multiple calcified lymph nodes in the mediastinum and hilar regions as well as at the lung bases. Slight linear scarring at the left base laterally. The lungs are otherwise clear. Fairly severe degenerative changes of both shoulders. CABG. IMPRESSION: No acute abnormalities. Electronically Signed   By: Lorriane Shire M.D.   On: 10/13/2015 13:45   Dg Chest 2 View  09/25/2015  CLINICAL DATA:  Back pain and cough for 3 days EXAM: CHEST  2 VIEW COMPARISON:  09/22/2015 FINDINGS: Cardiac shadow is stable. Changes of prior granulomatous disease are again noted. No focal infiltrate or sizable effusion is noted. Postsurgical changes are seen. Mild peribronchial cuffing is again identified likely of a chronic nature. A tiny right-sided pleural effusion is now seen. IMPRESSION: Tiny right-sided pleural  effusion. No other focal abnormality is seen. Prior granulomatous disease. Electronically Signed   By: Inez Catalina M.D.   On: 09/25/2015 17:42   Dg Chest 2 View  09/22/2015  CLINICAL DATA:  Acute onset of cough and sore throat. Generalized weakness and shortness of breath. Initial encounter. EXAM: CHEST  2 VIEW COMPARISON:  Chest radiograph from 01/05/2005 FINDINGS: The lungs are well-aerated. Peribronchial thickening is noted. There is no evidence of focal opacification, pleural effusion or pneumothorax. Calcified bilateral hilar and mediastinal nodes are again seen. The heart is normal in size; the patient is status post median sternotomy, with evidence of prior CABG. No acute osseous abnormalities are seen. IMPRESSION: 1. No acute cardiopulmonary process seen. 2. Calcified bilateral hilar and mediastinal nodes again seen, reflecting remote granulomatous disease. Peribronchial thickening noted. Electronically Signed   By: Garald Balding M.D.   On: 09/22/2015 03:36   Dg Hip Unilat With Pelvis 2-3 Views Left  10/13/2015  CLINICAL DATA:  Left hip pain after a fall while exercising. EXAM: DG HIP (WITH OR WITHOUT PELVIS) 2-3V LEFT COMPARISON:  None. FINDINGS: There is a subcapital fracture of the proximal right femur with slight proximal retraction of the distal fragment. The pelvic bones are intact. Extensive vascular calcification. IMPRESSION: Slightly retracted subcapital fracture of the proximal left femur. Electronically Signed   By: Lorriane Shire M.D.   On: 10/13/2015 13:44    ASSESSMENT:  Left femoral neck fracture   PLAN:  Needs hemi hip in order to sit up, get OOB, and potentially walk again.  Will plan on surgery tomorrow afternoon assuming cleared by cardiology.  On Eliquis and will hold for now though INR already in safe range.  Monik Lins G 10/13/2015, 8:13 PM

## 2015-10-13 NOTE — ED Notes (Addendum)
Per EMS pt was exercising with exercising ball and fell onto LEFT hip. Left leg is shortened and rotated outward. Strong left pedal pulse. 50 mcg fentanyl IV given en route with EMS.

## 2015-10-13 NOTE — ED Notes (Signed)
Bed: WA09 Expected date:  Expected time:  Means of arrival:  Comments: EMS, fall, hip shortening

## 2015-10-13 NOTE — H&P (Signed)
[facility] History and Physical  SCIPIO PRESSNALL [id number] DOB: 17-Feb-1929 DOA: 10/13/2015  Referring physician: Leonard Schwartz PCP: [name], MD   Orthopedic consult: Dr Latanya Maudlin ( [facility])  Primary cardiologist: Loralie Champagne    Chief Complaint:  Fall  HPI:  82 year old male with history of coronary artery disease history of CABG, hypertension, BPH, moderate aortic stenosis who presented to the ED after sustaining a fall. Patient was unable to exercise program at the gym when he lost his balance and fell on the ground while trying to pick the exercise ball. He landed on his left side. Patient denies headache, dizziness, fever, chills, nausea , vomiting, chest pain, palpitations, SOB, abdominal pain, bowel or urinary symptoms. Denies change in weight or appetite. At baseline he is able to ambulate at least 3-4 blocks without getting short of breath. Denies any orthopnea or PND. Is quite capable with his ADLs.  Course in the ED Vitals were stable for mildly elevated blood pressure. Blood work showed normal CBC. Sodium of 133, normal renal function, INR of 1.35. X-ray of the hip showed retracted so It a fracture of the proximal left femur. [facility] was consulted and recommended transfer to [facility] for surgical repair.   Review of Systems:  As outlined in history of present illness, otherwise 12 point review of systems unremarkable   Past Medical History  Diagnosis Date  . CAD (coronary artery disease)     a. H/o MI;  b. s/p CABG  . HTN (hypertension)   . Hyperlipidemia   . BPH (benign prostatic hypertrophy)   . Sigmoid diverticulitis   . Internal hemorrhoids   . Basal cell carcinoma   . Disequilibrium 04/03/2012  . Moderate aortic stenosis     a. 08/2014 Echo: EF 55-60%, no rwma, Gr 1 DD, mod AS, mildly dil asc Ao, mild MR, sev dil LA.  [name] Syncope     a. 08/2014 - ? orthostatic  . PAF (paroxysmal atrial fibrillation) ([place])     a. Dx  08/2014;  b. CHA2DS2VASc = 4 - decision made to withold [place] 2/2 unsteady gait and syncope.  . Medicare annual wellness visit, subsequent 03/16/2015    Follows with cardiology, Dr Aundra Dubin, Dr Bing Neighbors with dermatology, Dr Altamese Cabal Aged out of colonoscopy   Past Surgical History  Procedure Laterality Date  . Coronary artery bypass graft    . Colonoscopy    . Shoulder surgery      x2   Social History:  reports that he quit smoking about 44 years ago. He has never used smokeless tobacco. He reports that he drinks alcohol. He reports that he does not use illicit drugs.  Allergies  Allergen Reactions  . Tramadol Hcl Other (See Comments)    Hallucinations     Family History  Problem Relation Age of Onset  . Coronary artery disease Father   . Hyperlipidemia Father   . Heart attack Father   . Alcohol abuse Father   . Bipolar disorder Mother   . Heart disease Mother   . Alcohol abuse    . Alcohol abuse Brother   . Cancer Brother   . Cancer Maternal Grandfather     oral cancer, chew tobacco  . Cancer Sister   . Glaucoma Sister     Prior to Admission medications   Medication Sig Start Date End Date Taking? Authorizing Provider  acetaminophen (TYLENOL) 325 MG tablet Take 325 mg by mouth every 6 (six) hours as needed for mild  pain or moderate pain.    Yes Historical Provider, MD  apixaban (ELIQUIS) 5 MG TABS tablet Take 1 tablet (5 mg total) by mouth 2 (two) times daily. 07/28/15  Yes Larey Dresser, MD  CARTIA XT 120 MG 24 hr capsule TAKE ONE CAPSULE BY MOUTH ONCE DAILY 08/13/15  Yes Larey Dresser, MD  cholecalciferol (VITAMIN D) 1000 UNITS tablet Take 1,000 Units by mouth daily.    Yes Historical Provider, MD  diphenhydramine-acetaminophen (TYLENOL PM) 25-500 MG TABS Take 1 tablet by mouth at bedtime as needed (insomnia).   Yes Historical Provider, MD  lovastatin (MEVACOR) 20 MG tablet TAKE ONE TABLET BY MOUTH ONCE DAILY AT BEDTIME **PLEASE CALL [phone] FOR AN APPT** 08/28/15   Yes Larey Dresser, MD  Melatonin 3 MG TABS Take 3 mg by mouth at bedtime.   Yes Historical Provider, MD  Multiple Vitamin (MULTIVITAMIN) capsule Take 0.5 capsules by mouth daily.    Yes Historical Provider, MD  multivitamin-lutein (OCUVITE-LUTEIN) CAPS Take 1 capsule by mouth daily.    Yes Historical Provider, MD  Probiotic Product (PROBIOTIC DAILY PO) Take 1 capsule by mouth daily.   Yes Historical Provider, MD  doxycycline (VIBRAMYCIN) 100 MG capsule Take 1 capsule (100 mg total) by mouth 2 (two) times daily. 09/25/15   Lajean Saver, MD  hydrochlorothiazide (MICROZIDE) 12.5 MG capsule Take 1 capsule (12.5 mg total) by mouth daily. 09/20/14   Carlena Bjornstad, MD     Physical Exam:  Filed Vitals:   10/13/15 1242 10/13/15 1402 10/13/15 1526  BP:  163/93 173/89  Pulse:  64 69  Resp:  16 14  SpO2: 99% 100% 99%    Constitutional: Vital signs reviewed.  Elderly pleasant male not in distress HEENT: no pallor, no icterus, moist oral mucosa, no cervical lymphadenopathy supple neck Cardiovascular: RRR, S1 normal, S2 normal, systolic murmur 2/6, no rubs or gallop Chest: CTAB, no wheezes, rales, or rhonchi Abdominal: Soft. Non-tender, non-distended, bowel sounds are normal,  Ext: warm, no edema, limited mobility of the left hip  Neurological: A&O x3, non focal  Labs on Admission:  Basic Metabolic Panel:  Recent Labs Lab 10/13/15 1315  NA 133*  K 4.5  CL 100*  CO2 26  GLUCOSE 107*  BUN 16  CREATININE 0.97  CALCIUM 8.9   Liver Function Tests: No results for input(s): AST, ALT, ALKPHOS, BILITOT, PROT, ALBUMIN in the last 168 hours. No results for input(s): LIPASE, AMYLASE in the last 168 hours. No results for input(s): AMMONIA in the last 168 hours. CBC:  Recent Labs Lab 10/13/15 1315  WBC 7.2  NEUTROABS 5.3  HGB 13.2  HCT 39.1  MCV 84.1  PLT 421*   Cardiac Enzymes: No results for input(s): CKTOTAL, CKMB, CKMBINDEX, TROPONINI in the last 168 hours. BNP: Invalid  input(s): POCBNP CBG: No results for input(s): GLUCAP in the last 168 hours.  Radiological Exams on Admission: Dg Chest 1 View  10/13/2015  CLINICAL DATA:  Left hip fracture.  Pre operative respiratory exam. EXAM: CHEST 1 VIEW COMPARISON:  09/25/2015 FINDINGS: The heart size and pulmonary vascularity are normal. There arm multiple calcified lymph nodes in the mediastinum and hilar regions as well as at the lung bases. Slight linear scarring at the left base laterally. The lungs are otherwise clear. Fairly severe degenerative changes of both shoulders. CABG. IMPRESSION: No acute abnormalities. Electronically Signed   By: Lorriane Shire M.D.   On: 10/13/2015 13:45   Dg Hip Unilat With Pelvis 2-3 Views  Left  10/13/2015  CLINICAL DATA:  Left hip pain after a fall while exercising. EXAM: DG HIP (WITH OR WITHOUT PELVIS) 2-3V LEFT COMPARISON:  None. FINDINGS: There is a subcapital fracture of the proximal right femur with slight proximal retraction of the distal fragment. The pelvic bones are intact. Extensive vascular calcification. IMPRESSION: Slightly retracted subcapital fracture of the proximal left femur. Electronically Signed   By: Lorriane Shire M.D.   On: 10/13/2015 13:44    EKG: Normal sinus rhythm at 64 with left anterior fascicle block (new)  Assessment/Plan  Principal Problem:   Closed left hip fracture (HCC) Admit to telemetry on [place]. Hip fracture pathway initiated on admission. Control with Vicodin and IV morphine when necessary. Added bowel regimen. -[facility] consulted ( Dr Latanya Maudlin) -Has moderate to severe perioperative cardiac risk (age, coronary artery disease, moderate aortic stenosis, A. fib). Will consult cardiology for preoperative clearance. Added low dose metoprolol.    Active Problems: Paroxysmal A. fib On Cardizem for rate control. Also on Eliquis which is held for surgery.     Essential hypertension Stable. Continue home medications. We'll add  perioperative beta blocker.  Coronary artery disease Continue statin.     Diet:cardiac  DVT prophylaxis: On Eliquis, held for surgery   Code Status:full code Family Communication: wife at bedside Disposition Plan: admit to  [facility] on telemetry. Signed out to Dr. Verneita Griffes.  Louellen Molder [facility] Pager [phone]  Total time spent on admission :60 minutes  If 7PM-7AM, please contact night-coverage [email or web address] Password [facility] 10/13/2015, 3:43 PM

## 2015-10-13 NOTE — ED Notes (Signed)
Additional fentanyl given prior to clothes removed per [facility] request.

## 2015-10-14 ENCOUNTER — Inpatient Hospital Stay (HOSPITAL_COMMUNITY): Admission: RE | Admit: 2015-10-14 | Payer: PPO | Source: Ambulatory Visit | Admitting: Orthopaedic Surgery

## 2015-10-14 ENCOUNTER — Inpatient Hospital Stay (HOSPITAL_COMMUNITY): Payer: PPO | Admitting: Certified Registered"

## 2015-10-14 ENCOUNTER — Encounter (HOSPITAL_COMMUNITY): Payer: Self-pay | Admitting: Certified Registered"

## 2015-10-14 ENCOUNTER — Encounter (HOSPITAL_COMMUNITY)
Admission: EM | Disposition: A | Discharge status: Skilled Nursing Facility | Payer: Self-pay | Source: Home / Self Care | Attending: Internal Medicine

## 2015-10-14 DIAGNOSIS — I350 Nonrheumatic aortic (valve) stenosis: Secondary | ICD-10-CM | POA: Diagnosis not present

## 2015-10-14 DIAGNOSIS — I25810 Atherosclerosis of coronary artery bypass graft(s) without angina pectoris: Secondary | ICD-10-CM

## 2015-10-14 DIAGNOSIS — S72092A Other fracture of head and neck of left femur, initial encounter for closed fracture: Secondary | ICD-10-CM | POA: Diagnosis not present

## 2015-10-14 DIAGNOSIS — I2510 Atherosclerotic heart disease of native coronary artery without angina pectoris: Secondary | ICD-10-CM | POA: Diagnosis not present

## 2015-10-14 DIAGNOSIS — G629 Polyneuropathy, unspecified: Secondary | ICD-10-CM | POA: Diagnosis not present

## 2015-10-14 DIAGNOSIS — I10 Essential (primary) hypertension: Secondary | ICD-10-CM | POA: Diagnosis not present

## 2015-10-14 DIAGNOSIS — S72002A Fracture of unspecified part of neck of left femur, initial encounter for closed fracture: Secondary | ICD-10-CM | POA: Diagnosis not present

## 2015-10-14 DIAGNOSIS — I480 Paroxysmal atrial fibrillation: Secondary | ICD-10-CM | POA: Diagnosis not present

## 2015-10-14 HISTORY — PX: HIP ARTHROPLASTY: SHX981

## 2015-10-14 LAB — BASIC METABOLIC PANEL
Anion gap: 9 (ref 5–15)
BUN: 10 mg/dL (ref 6–20)
CALCIUM: 9 mg/dL (ref 8.9–10.3)
CO2: 26 mmol/L (ref 22–32)
CREATININE: 0.92 mg/dL (ref 0.61–1.24)
Chloride: 101 mmol/L (ref 101–111)
GFR calc non Af Amer: >60 mL/min (ref >60)
Glucose, Bld: 101 mg/dL — ABNORMAL HIGH (ref 65–99)
Potassium: 4 mmol/L (ref 3.5–5.1)
SODIUM: 136 mmol/L (ref 135–145)

## 2015-10-14 LAB — TYPE AND SCREEN
ABO/RH(D): O NEG
ANTIBODY SCREEN: NEGATIVE

## 2015-10-14 LAB — CBC
HCT: 36.5 % — ABNORMAL LOW (ref 39.0–52.0)
Hemoglobin: 12.3 g/dL — ABNORMAL LOW (ref 13.0–17.0)
MCH: 28.4 pg (ref 26.0–34.0)
MCHC: 33.7 g/dL (ref 30.0–36.0)
MCV: 84.3 fL (ref 78.0–100.0)
Platelets: 367 10*3/uL (ref 150–400)
RBC: 4.33 MIL/uL (ref 4.22–5.81)
RDW: 14.5 % (ref 11.5–15.5)
WBC: 8.6 10*3/uL (ref 4.0–10.5)

## 2015-10-14 LAB — SURGICAL PCR SCREEN
MRSA, PCR: NEGATIVE
STAPHYLOCOCCUS AUREUS: NEGATIVE

## 2015-10-14 LAB — ABO/RH: ABO/RH(D): O NEG

## 2015-10-14 SURGERY — HEMIARTHROPLASTY, HIP, DIRECT ANTERIOR APPROACH, FOR FRACTURE
Anesthesia: General | Site: Hip | Laterality: Left

## 2015-10-14 MED ORDER — BUPIVACAINE LIPOSOME 1.3 % IJ SUSP
INTRAMUSCULAR | Status: DC | PRN
  Administered 2015-10-14: 20 mL

## 2015-10-14 MED ORDER — FENTANYL CITRATE (PF) 100 MCG/2ML IJ SOLN
INTRAMUSCULAR | Status: DC | PRN
  Administered 2015-10-14: 50 ug via INTRAVENOUS
  Administered 2015-10-14: 100 ug via INTRAVENOUS

## 2015-10-14 MED ORDER — MEPERIDINE HCL 25 MG/ML IJ SOLN: 6.2500 mg | INTRAMUSCULAR | Status: DC | PRN

## 2015-10-14 MED ORDER — ONDANSETRON HCL 4 MG/2ML IJ SOLN
INTRAMUSCULAR | Status: DC | PRN
  Administered 2015-10-14: 4 mg via INTRAVENOUS

## 2015-10-14 MED ORDER — METOCLOPRAMIDE HCL 5 MG/ML IJ SOLN: 5.0000 mg | Freq: Three times a day (TID) | INTRAMUSCULAR | Status: DC | PRN

## 2015-10-14 MED ORDER — CEFAZOLIN SODIUM-DEXTROSE 2-4 GM/100ML-% IV SOLN
2.0000 g | Freq: Four times a day (QID) | INTRAVENOUS | Status: AC
  Administered 2015-10-14 – 2015-10-15 (×2): 2 g via INTRAVENOUS
  Filled 2015-10-14 (×2): qty 100

## 2015-10-14 MED ORDER — PROPOFOL 10 MG/ML IV BOLUS
INTRAVENOUS | Status: DC | PRN
  Administered 2015-10-14: 110 mg via INTRAVENOUS

## 2015-10-14 MED ORDER — BUPIVACAINE-EPINEPHRINE (PF) 0.25% -1:200000 IJ SOLN
INTRAMUSCULAR | Status: DC | PRN
  Administered 2015-10-14: 20 mL

## 2015-10-14 MED ORDER — METOCLOPRAMIDE HCL 5 MG PO TABS: 5.0000 mg | ORAL_TABLET | Freq: Three times a day (TID) | ORAL | Status: DC | PRN

## 2015-10-14 MED ORDER — ACETAMINOPHEN 325 MG PO TABS
650.0000 mg | ORAL_TABLET | Freq: Four times a day (QID) | ORAL | Status: DC | PRN
  Administered 2015-10-15 (×3): 650 mg via ORAL
  Filled 2015-10-14 (×3): qty 2

## 2015-10-14 MED ORDER — PROPOFOL 10 MG/ML IV BOLUS
INTRAVENOUS | Status: AC
  Filled 2015-10-14: qty 20

## 2015-10-14 MED ORDER — BUPIVACAINE LIPOSOME 1.3 % IJ SUSP
20.0000 mL | Freq: Once | INTRAMUSCULAR | Status: DC
  Filled 2015-10-14: qty 20

## 2015-10-14 MED ORDER — ONDANSETRON HCL 4 MG PO TABS: 4.0000 mg | ORAL_TABLET | Freq: Four times a day (QID) | ORAL | Status: DC | PRN

## 2015-10-14 MED ORDER — TRANEXAMIC ACID 1000 MG/10ML IV SOLN
2000.0000 mg | Freq: Once | INTRAVENOUS | Status: DC
  Filled 2015-10-14 (×3): qty 20

## 2015-10-14 MED ORDER — ACETAMINOPHEN 650 MG RE SUPP: 650.0000 mg | Freq: Four times a day (QID) | RECTAL | Status: DC | PRN

## 2015-10-14 MED ORDER — ONDANSETRON HCL 4 MG/2ML IJ SOLN: 4.0000 mg | Freq: Four times a day (QID) | INTRAMUSCULAR | Status: DC | PRN

## 2015-10-14 MED ORDER — MENTHOL 3 MG MT LOZG: 1.0000 | LOZENGE | OROMUCOSAL | Status: DC | PRN

## 2015-10-14 MED ORDER — FENTANYL CITRATE (PF) 100 MCG/2ML IJ SOLN: 25.0000 ug | INTRAMUSCULAR | Status: DC | PRN

## 2015-10-14 MED ORDER — GLYCOPYRROLATE 0.2 MG/ML IJ SOLN
INTRAMUSCULAR | Status: DC | PRN
  Administered 2015-10-14: 0.2 mg via INTRAVENOUS

## 2015-10-14 MED ORDER — BUPIVACAINE-EPINEPHRINE (PF) 0.25% -1:200000 IJ SOLN
INTRAMUSCULAR | Status: AC
  Filled 2015-10-14: qty 30

## 2015-10-14 MED ORDER — LIDOCAINE HCL (CARDIAC) 20 MG/ML IV SOLN
INTRAVENOUS | Status: DC | PRN
  Administered 2015-10-14: 50 mg via INTRAVENOUS

## 2015-10-14 MED ORDER — LACTATED RINGERS IV SOLN: INTRAVENOUS | Status: DC

## 2015-10-14 MED ORDER — TRANEXAMIC ACID 1000 MG/10ML IV SOLN
2000.0000 mg | INTRAVENOUS | Status: DC | PRN
  Administered 2015-10-14: 2000 mg via TOPICAL

## 2015-10-14 MED ORDER — NEOSTIGMINE METHYLSULFATE 10 MG/10ML IV SOLN
INTRAVENOUS | Status: DC | PRN
  Administered 2015-10-14: 1.5 mg via INTRAVENOUS

## 2015-10-14 MED ORDER — FENTANYL CITRATE (PF) 250 MCG/5ML IJ SOLN
INTRAMUSCULAR | Status: AC
  Filled 2015-10-14: qty 5

## 2015-10-14 MED ORDER — ROCURONIUM BROMIDE 50 MG/5ML IV SOLN
INTRAVENOUS | Status: AC
  Filled 2015-10-14: qty 1

## 2015-10-14 MED ORDER — SODIUM CHLORIDE 0.9 % IJ SOLN
INTRAMUSCULAR | Status: DC | PRN
  Administered 2015-10-14: 40 mL

## 2015-10-14 MED ORDER — EPHEDRINE SULFATE 50 MG/ML IJ SOLN
INTRAMUSCULAR | Status: DC | PRN
  Administered 2015-10-14: 10 mg via INTRAVENOUS

## 2015-10-14 MED ORDER — EPHEDRINE SULFATE 50 MG/ML IJ SOLN
INTRAMUSCULAR | Status: AC
  Filled 2015-10-14: qty 2

## 2015-10-14 MED ORDER — PHENOL 1.4 % MT LIQD: 1.0000 | OROMUCOSAL | Status: DC | PRN

## 2015-10-14 MED ORDER — SODIUM CHLORIDE 0.9 % IR SOLN
Status: DC | PRN
  Administered 2015-10-14: 1000 mL

## 2015-10-14 MED ORDER — METHOCARBAMOL 1000 MG/10ML IJ SOLN
500.0000 mg | Freq: Four times a day (QID) | INTRAVENOUS | Status: DC | PRN
  Filled 2015-10-14: qty 5

## 2015-10-14 MED ORDER — ROCURONIUM BROMIDE 100 MG/10ML IV SOLN
INTRAVENOUS | Status: DC | PRN
  Administered 2015-10-14: 40 mg via INTRAVENOUS

## 2015-10-14 MED ORDER — METHOCARBAMOL 500 MG PO TABS: 500.0000 mg | ORAL_TABLET | Freq: Four times a day (QID) | ORAL | Status: DC | PRN

## 2015-10-14 MED ORDER — ASPIRIN EC 325 MG PO TBEC: 325.0000 mg | DELAYED_RELEASE_TABLET | Freq: Two times a day (BID) | ORAL | Status: DC

## 2015-10-14 SURGICAL SUPPLY — 65 items
BLADE SAW SAG 73X25 THK (BLADE) ×2
BLADE SAW SGTL 73X25 THK (BLADE) ×1 IMPLANT
BLADE SURG ROTATE 9660 (MISCELLANEOUS) IMPLANT
BRUSH FEMORAL CANAL (MISCELLANEOUS) IMPLANT
CAPT HIP HEMI 1 ×3 IMPLANT
CEMENT BONE DEPUY (Cement) ×6 IMPLANT
CEMENT RESTRICTOR DEPUY SZ 4 (Cement) ×3 IMPLANT
COVER SURGICAL LIGHT HANDLE (MISCELLANEOUS) ×3 IMPLANT
DRAPE IMP U-DRAPE 54X76 (DRAPES) ×3 IMPLANT
DRAPE ORTHO SPLIT 77X108 STRL (DRAPES) ×6
DRAPE PROXIMA HALF (DRAPES) ×3 IMPLANT
DRAPE SURG ORHT 6 SPLT 77X108 (DRAPES) ×2 IMPLANT
DRAPE U-SHAPE 47X51 STRL (DRAPES) ×3 IMPLANT
DRILL BIT 7/64X5 (BIT) ×3 IMPLANT
DRSG ADAPTIC 3X8 NADH LF (GAUZE/BANDAGES/DRESSINGS) ×3 IMPLANT
DRSG AQUACEL AG ADV 3.5X10 (GAUZE/BANDAGES/DRESSINGS) ×3 IMPLANT
DRSG PAD ABDOMINAL 8X10 ST (GAUZE/BANDAGES/DRESSINGS) ×6 IMPLANT
DURAPREP 26ML APPLICATOR (WOUND CARE) ×3 IMPLANT
ELECT BLADE 6.5 EXT (BLADE) IMPLANT
ELECT REM PT RETURN 9FT ADLT (ELECTROSURGICAL) ×3
ELECTRODE REM PT RTRN 9FT ADLT (ELECTROSURGICAL) ×1 IMPLANT
EVACUATOR 1/8 PVC DRAIN (DRAIN) IMPLANT
GAUZE SPONGE 4X4 12PLY STRL (GAUZE/BANDAGES/DRESSINGS) ×3 IMPLANT
GLOVE BIO SURGEON STRL SZ8 (GLOVE) ×12 IMPLANT
GLOVE BIOGEL PI IND STRL 8 (GLOVE) ×2 IMPLANT
GLOVE BIOGEL PI INDICATOR 8 (GLOVE) ×4
GLOVE BIOGEL PI ORTHO PRO 7.5 (GLOVE) ×4
GLOVE PI ORTHO PRO STRL 7.5 (GLOVE) ×2 IMPLANT
GOWN STRL REUS W/ TWL LRG LVL3 (GOWN DISPOSABLE) ×1 IMPLANT
GOWN STRL REUS W/ TWL XL LVL3 (GOWN DISPOSABLE) ×3 IMPLANT
GOWN STRL REUS W/TWL LRG LVL3 (GOWN DISPOSABLE) ×2
GOWN STRL REUS W/TWL XL LVL3 (GOWN DISPOSABLE) ×9
HANDPIECE INTERPULSE COAX TIP (DISPOSABLE)
IMMOBILIZER KNEE 20 (SOFTGOODS) IMPLANT
IMMOBILIZER KNEE 22 (SOFTGOODS) ×3 IMPLANT
IMMOBILIZER KNEE 22 UNIV (SOFTGOODS) IMPLANT
IMMOBILIZER KNEE 24 THIGH 36 (MISCELLANEOUS) IMPLANT
IMMOBILIZER KNEE 24 UNIV (MISCELLANEOUS)
KIT BASIN OR (CUSTOM PROCEDURE TRAY) ×3 IMPLANT
KIT ROOM TURNOVER OR (KITS) ×3 IMPLANT
MANIFOLD NEPTUNE II (INSTRUMENTS) ×3 IMPLANT
NEEDLE 1/2 CIR MAYO (NEEDLE) IMPLANT
NEEDLE 22X1 1/2 (OR ONLY) (NEEDLE) ×3 IMPLANT
NS IRRIG 1000ML POUR BTL (IV SOLUTION) ×3 IMPLANT
PACK TOTAL JOINT (CUSTOM PROCEDURE TRAY) ×3 IMPLANT
PACK UNIVERSAL I (CUSTOM PROCEDURE TRAY) ×3 IMPLANT
PAD ARMBOARD 7.5X6 YLW CONV (MISCELLANEOUS) ×6 IMPLANT
PASSER SUT SWANSON 36MM LOOP (INSTRUMENTS) IMPLANT
PRESSURIZER FEMORAL UNIV (MISCELLANEOUS) ×3 IMPLANT
SET HNDPC FAN SPRY TIP SCT (DISPOSABLE) IMPLANT
STAPLER VISISTAT 35W (STAPLE) ×6 IMPLANT
SUCTION FRAZIER HANDLE 10FR (MISCELLANEOUS) ×2
SUCTION TUBE FRAZIER 10FR DISP (MISCELLANEOUS) ×1 IMPLANT
SUT ETHIBOND NAB CT1 #1 30IN (SUTURE) ×3 IMPLANT
SUT VIC AB 0 CT1 27 (SUTURE) ×3
SUT VIC AB 0 CT1 27XBRD ANBCTR (SUTURE) ×1 IMPLANT
SUT VIC AB 1 CTB1 27 (SUTURE) ×9 IMPLANT
SUT VIC AB 2-0 CT1 27 (SUTURE) ×2
SUT VIC AB 2-0 CT1 TAPERPNT 27 (SUTURE) ×1 IMPLANT
SYR 50ML LL SCALE MARK (SYRINGE) ×3 IMPLANT
TOWEL OR 17X24 6PK STRL BLUE (TOWEL DISPOSABLE) ×3 IMPLANT
TOWEL OR 17X26 10 PK STRL BLUE (TOWEL DISPOSABLE) ×3 IMPLANT
TOWER CARTRIDGE SMART MIX (DISPOSABLE) IMPLANT
TRAY FOLEY CATH 16FRSI W/METER (SET/KITS/TRAYS/PACK) IMPLANT
WATER STERILE IRR 1000ML POUR (IV SOLUTION) ×12 IMPLANT

## 2015-10-14 NOTE — Progress Notes (Addendum)
PROGRESS NOTE    CARLESTER DAYS  [id number] DOB: [birth year] DOA: 10/13/2015 PCP: [name], MD  Outpatient Specialists:     Brief Narrative:  Very active 82 year y/o ? P A. Fib, [place] =4 Diagnosed 09/15/2014 CAD Moderate AoS ?Polyneuropathy followed by Dr. Tomi Likens Elevated PSA 01/2010 Prior right rotator cuff tear status post surgery Dr. Suszanne Conners Basal Cell Ca Orthostatic hypotension hld  Patient was ironically at his balance class and sustained a fall wherein he had a closed left hip fracture Patient came to [facility] long emergency room and was admitted by the hospital service and orthopedics and cardiology consult and for management  He is the retired Programme researcher, broadcasting/film/video of [employer] and worked with [employer] for over 30 years  Patient is seen today preop downstairs in the preop lounge He is doing fine He has no chest pain He has no nausea He has no shortness breath He feels fine and his pain is well Building control surveyor & Plan:   Principal Problem:   Closed left hip fracture ([employer]) -Awaiting surgery 1. per discretion Ortho services-appreciate their input into   Anticoagulation post-opContinue HCTZ 12.5 daily-check labs in a.m. postop CBC check in a.m.  Weight bearing tolerance for therapy services  Wound care  Pain management--Please try not to give meperidine-this has no benefit over Tylenol and biuildsup toxic metabolite that can cause rigors  Follow-up services required     Hyperlipidemia Continue Pravachol 10 daily    Essential hypertension Complicated by Orthostatic hypotension Consider cutting back HCTZ or d/c this admission    CAD, ARTERY BYPASS GRAFT   Aortic stenosis -Impressive 3/6 murmur-outpatient follow-up and echocardiogram as per ACC guidelines as an outpatient   PAF (paroxysmal atrial fibrillation) (HCC)Chad score 4 Continue Cardizem 120 daily Continue metoprolol 25 twice a day Home eliquis on hold Unclear If needs  antiplatelet + anticoagulant-defer to cardiology     DVT prophylaxis: None Until okayed by orthopedics Code Status: Full Family Communication: d/w wife bedside Disposition Plan: inpatient pending resolution   Consultants:   Ortho  cards  Procedures:   none  Antimicrobials:   non    Subjective:   alert pleasant conversant   Objective: Filed Vitals:   10/14/15 0100 10/14/15 0300 10/14/15 0954 10/14/15 1400  BP: 170/80 148/68 151/75 158/73  Pulse: 77 64 67 64  Temp: 97.5 F (36.4 C) 97.9 F (36.6 C)  98.2 F (36.8 C)  TempSrc: Oral Oral  Oral  Resp: 16 16  16   Height:      Weight:      SpO2: 99% 95%  96%    Intake/Output Summary (Last 24 hours) at 10/14/15 1625 Last data filed at 10/14/15 0924  Gross per 24 hour  Intake      0 ml  Output    850 ml  Net   -850 ml   Filed Weights   10/13/15 1723  Weight: 61.689 kg (136 lb)    Examination:  General exam: Calm Respiratory system: Clear to auscultation. Gastrointestinal system: Abdomen is nondistended, soft and nontender. Central nervous system: Alert and oriented. No focal neurological deficits. Extremities: Symmetric 5 x 5 power-did not examine LLE Skin: No rashes, lesions or ulcers Psychiatry: Judgement and insight appear normal. Mood & affect appropriate.     Data Reviewed: I have personally reviewed following labs and imaging studies  CBC:  Recent Labs Lab 10/13/15 1315 10/14/15 0844  WBC 7.2 8.6  NEUTROABS 5.3  --  HGB 13.2 12.3*  HCT 39.1 36.5*  MCV 84.1 84.3  PLT 421* [id number]   Basic Metabolic Panel:  Recent Labs Lab 10/13/15 1315 10/14/15 0844  NA 133* 136  K 4.5 4.0  CL 100* 101  CO2 26 26  GLUCOSE 107* 101*  BUN 16 10  CREATININE 0.97 0.92  CALCIUM 8.9 9.0   GFR: Estimated Creatinine Clearance: 51.1 mL/min (by C-G formula based on Cr of 0.92). Liver Function Tests: No results for input(s): AST, ALT, ALKPHOS, BILITOT, PROT, ALBUMIN in the last 168 hours. No  results for input(s): LIPASE, AMYLASE in the last 168 hours. No results for input(s): AMMONIA in the last 168 hours. Coagulation Profile:  Recent Labs Lab 10/13/15 1315  INR 1.35   Cardiac Enzymes: No results for input(s): CKTOTAL, CKMB, CKMBINDEX, TROPONINI in the last 168 hours. BNP (last 3 results) No results for input(s): PROBNP in the last 8760 hours. HbA1C: No results for input(s): HGBA1C in the last 72 hours. CBG: No results for input(s): GLUCAP in the last 168 hours. Lipid Profile: No results for input(s): CHOL, HDL, LDLCALC, TRIG, CHOLHDL, LDLDIRECT in the last 72 hours. Thyroid Function Tests: No results for input(s): TSH, T4TOTAL, FREET4, T3FREE, THYROIDAB in the last 72 hours. Anemia Panel: No results for input(s): VITAMINB12, FOLATE, FERRITIN, TIBC, IRON, RETICCTPCT in the last 72 hours. Urine analysis:    Component Value Date/Time   COLORURINE YELLOW 09/25/2015 1730   APPEARANCEUR CLEAR 09/25/2015 1730   LABSPEC 1.010 09/25/2015 1730   PHURINE 7.0 09/25/2015 1730   GLUCOSEU NEGATIVE 09/25/2015 1730   GLUCOSEU NEG mg/dL 02/16/2010 0000   HGBUR TRACE* 09/25/2015 1730   BILIRUBINUR NEGATIVE 09/25/2015 1730   KETONESUR NEGATIVE 09/25/2015 1730   PROTEINUR 30* 09/25/2015 1730   UROBILINOGEN 0.2 09/13/2014 1634   NITRITE NEGATIVE 09/25/2015 1730   LEUKOCYTESUR SMALL* 09/25/2015 1730   Sepsis Labs: @LABRCNTIP (procalcitonin:4,lacticidven:4)  ) Recent Results (from the past 240 hour(s))  Surgical pcr screen     Status: None   Collection Time: 10/13/15 10:14 PM  Result Value Ref Range Status   MRSA, PCR NEGATIVE NEGATIVE Final   Staphylococcus aureus NEGATIVE NEGATIVE Final    Comment:        The Xpert SA Assay (FDA approved for NASAL specimens in patients over 80 years of age), is one component of a comprehensive surveillance program.  Test performance has been validated by [facility] for patients greater than or equal to 80 year old. It is not  intended to diagnose infection nor to guide or monitor treatment.          Radiology Studies: Dg Chest 1 View  10/13/2015  CLINICAL DATA:  Left hip fracture.  Pre operative respiratory exam. EXAM: CHEST 1 VIEW COMPARISON:  09/25/2015 FINDINGS: The heart size and pulmonary vascularity are normal. There arm multiple calcified lymph nodes in the mediastinum and hilar regions as well as at the lung bases. Slight linear scarring at the left base laterally. The lungs are otherwise clear. Fairly severe degenerative changes of both shoulders. CABG. IMPRESSION: No acute abnormalities. Electronically Signed   By: Lorriane Shire M.D.   On: 10/13/2015 13:45   Dg Hip Unilat With Pelvis 2-3 Views Left  10/13/2015  CLINICAL DATA:  Left hip pain after a fall while exercising. EXAM: DG HIP (WITH OR WITHOUT PELVIS) 2-3V LEFT COMPARISON:  None. FINDINGS: There is a subcapital fracture of the proximal right femur with slight proximal retraction of the distal fragment. The pelvic bones are intact.  Extensive vascular calcification. IMPRESSION: Slightly retracted subcapital fracture of the proximal left femur. Electronically Signed   By: Lorriane Shire M.D.   On: 10/13/2015 13:44        Scheduled Meds: . bupivacaine liposome  20 mL Infiltration Once  .  ceFAZolin (ANCEF) IV  3 g Intravenous To SS-Surg  . [MAR Hold] cholecalciferol  1,000 Units Oral Daily  . [MAR Hold] diltiazem  120 mg Oral Daily  . [MAR Hold] FLORA-Q  1 capsule Oral Daily  . [MAR Hold] hydrochlorothiazide  12.5 mg Oral Daily  . [MAR Hold] Melatonin  3 mg Oral QHS  . [MAR Hold] metoprolol tartrate  25 mg Oral BID  . [MAR Hold] multivitamin with minerals  1 tablet Oral Daily  . povidone-iodine  2 application Topical Once  . [MAR Hold] pravastatin  10 mg Oral q1800  . [MAR Hold] senna  1 tablet Oral BID   Continuous Infusions: . lactated ringers 50 mL/hr at 10/13/15 2236  . lactated ringers       LOS: 1 day    Time spent:  [place], MD [facility]   If 7PM-7AM, please contact night-coverage [email or web address] Password [facility] 10/14/2015, 4:25 PM

## 2015-10-14 NOTE — Progress Notes (Signed)
Initial Nutrition Assessment  DOCUMENTATION CODES:   Not applicable  INTERVENTION:  -Ensure Enlive po BID, each supplement provides 350 kcal and 20 grams of protein w/ diet advancement -RD continue to monitor  NUTRITION DIAGNOSIS:   Increased nutrient needs related to wound healing as evidenced by estimated needs.  GOAL:   Patient will meet greater than or equal to 90% of their needs  MONITOR:   PO intake, Labs, I & O's, Weight trends  REASON FOR ASSESSMENT:   Consult Assessment of nutrition requirement/status  ASSESSMENT:   82 yo male with CAD s/p CABG and moderate AS, suffered a mechanical fall and left hip fracture which will require operative repair. He is followed by Dr Aundra Dubin and has been stable from a cardiac perspective. The patient exercises regularly without exertional symptoms.   Spoke with Mr. Lachney, wife at bedside. They were a very pleasant couple who are very active. Mr. Hellenbrand and his wife lift weights and walk daily. This is evident by his muscles where he only exhibited mild-moderate muscle wasting in a few places.  He does exhibit an 11#/7% severe wt loss in 2 weeks which he and his wife claim were a result of the flu. PO intake reported as good. No nausea/vomiting No chewing/swallowing problems. Will undergo surgery today.  Labs and Medications reviewed.  Diet Order:  Diet NPO time specified Except for: Sips with Meds  Skin:  Reviewed, no issues  Last BM:  4/17  Height:   Ht Readings from Last 1 Encounters:  10/13/15 5\' 5"  (1.651 m)    Weight:   Wt Readings from Last 1 Encounters:  10/13/15 136 lb (61.689 kg)    Ideal Body Weight:  56.81 kg  BMI:  Body mass index is 22.63 kg/(m^2).  Estimated Nutritional Needs:   Kcal:  1550-1850  Protein:  60-75 grams  Fluid:  >/= 1.5L  EDUCATION NEEDS:   No education needs identified at this time  Satira Anis. Morris Longenecker, MS, RD LDN After Hours/Weekend Pager [phone]

## 2015-10-14 NOTE — Op Note (Addendum)
PRE-OP DIAGNOSIS:  LEFT FEMORAL NECK FRACTURE  POST-OP DIAGNOSIS:  LEFT FEMORAL NECK FRACTURE  PROCEDURE:  Procedure(s): LEFT HIP HEMI ARTHROPLASTY (HEMIARTHROPLASTY) ANESTHESIA:  general SURGEON:  [name] MD ASSISTANT:  Loni Dolly PA-C   INDICATIONS FOR PROCEDURE:  The patient is a 82 y.o. male with a recent history of a painful hip and x-rays which show a displaced femoral neck fracture.  Hemiarthroplasty is offered as surgical treatment.  Informed operative consent was obtained after discussion of possible complications including reaction to anesthesia, infection, neurovascular injury, dislocation, DVT, PE, and death.  The importance of the postoperative rehab program to optimize result was stressed with the patient.  SUMMARY OF FINDINGS AND PROCEDURE:  Under general anesthesia through a standard posterior approach a hip hemiarthroplasty was performed.  The patient had no degenerative change and good bone quality.  We used DePuy components to address the hip fracture and these were size Summit 5 Basic femur capped with a +0 35mm hip ball.  Loni Dolly PA-C assisted throughout and was invaluable to the completion of the case in that he helped position and retract while I performed the procedure.  He also closed simultaneously to help minimize OR time.  DESCRIPTION OF PROCEDURE:  The patient was taken to the OR suite where general anesthetic was applied.  The patient was then positioned in the lateral decubitus position with the operative hip up.  All bony prominences were appropriately padded, hip positioners were utilized, and an axillary roll was placed.  Prep and drape was then performed in normal sterile fashion.  The patient was given kefzol preoperative antibiotic and an appropriate time out was performed.  We then took a posterior approach to the right hip.  Dissection was taken through adipose to the IT band and gluteus maximus fascia.  These structures were incised longitudinally to  expose the short external rotators of the hip which were tagged and reflected.  A partial posterior capsulectomy was performed and the hip was dislocated.  A femoral neck cut was made below the fracture site and the femoral head was removed.  The femur was reamed and then broached to the appropriate size.  A trial reduction was done and the aforementioned head and neck assembly gave [place] the best stability in extension with external rotation and flexion with internal rotation.  Leg lengths were felt to be about equal.  The trial components were removed and the wound irrigated.  We elected to use a cemented femoral component.  We then placed the femoral component in appropriate anteversion.  The head was applied to a dry stem neck and the hip again reduced.  It was again stable in the aforementioned positions.  The would was irrigated again followed by re-approximation of short external rotators to the greater trochanteric region.  IT band and gluteus maximum fascia were repaired with #1 vicryl followed by subcutaneous closure with #O and #2 undyed vicryl.  Skin was closed with staples followed by a sterile dressing.  EBL and IOF can be obtained from anesthesia records.  DISPOSITION:  The patient was extubated in the OR and taken to PACU in stable condition to be admitted back to the medicine service  for appropriate post-op care to include perioperative antibiotics and DVT prophylaxis.

## 2015-10-14 NOTE — Progress Notes (Signed)
Rept called to [employer] in preop holding area. No change in patient's AM assessment. Tele d/ced for transport to preop holding area per MD order. Pt transferred via hospital bed accompanied by wife without incident. Pt pain tolerable. VSS. IV saline locked for transport.

## 2015-10-14 NOTE — Interval H&P Note (Signed)
OK for surgery PD 

## 2015-10-14 NOTE — Anesthesia Procedure Notes (Signed)
Procedure Name: Intubation Date/Time: 10/14/2015 4:49 PM Performed by: Melina Copa, Mariyanna Mucha R Pre-anesthesia Checklist: Patient identified, Emergency Drugs available, Suction available and Patient being monitored Patient Re-evaluated:Patient Re-evaluated prior to inductionOxygen Delivery Method: Circle System Utilized Preoxygenation: Pre-oxygenation with 100% oxygen Intubation Type: IV induction Ventilation: Mask ventilation without difficulty Laryngoscope Size: Mac and 4 Grade View: Grade I Tube type: Oral Number of attempts: 1 Airway Equipment and Method: Stylet Placement Confirmation: ETT inserted through vocal cords under direct vision,  positive ETCO2 and breath sounds checked- equal and bilateral Secured at: 22 cm Tube secured with: Tape Dental Injury: Teeth and Oropharynx as per pre-operative assessment

## 2015-10-14 NOTE — Transfer of Care (Signed)
Immediate Anesthesia Transfer of Care Note  Patient: Justin [name]  Procedure(s) Performed: Procedure(s): LEFT HIP HEMI ARTHROPLASTY (Left)  Patient Location: PACU  Anesthesia Type:General  Level of Consciousness: awake, oriented and patient cooperative  Airway & Oxygen Therapy: Patient Spontanous Breathing and Patient connected to nasal cannula oxygen  Post-op Assessment: Report given to RN, Post -op Vital signs reviewed and stable and Patient moving all extremities  Post vital signs: Reviewed and stable  Last Vitals:  Filed Vitals:   10/14/15 0954 10/14/15 1400  BP: 151/75 158/73  Pulse: 67 64  Temp:  36.8 C  Resp:  16    Complications: No apparent anesthesia complications

## 2015-10-14 NOTE — Anesthesia Preprocedure Evaluation (Addendum)
Anesthesia Evaluation  Patient identified by MRN, date of birth, ID band Patient awake    Reviewed: Allergy & Precautions, NPO status , Patient's Chart, lab work & pertinent test results  Airway Mallampati: II  TM Distance: >3 FB Neck ROM: Full    Dental  (+) Teeth Intact, Dental Advisory Given   Pulmonary former smoker,    breath sounds clear to auscultation       Cardiovascular hypertension, Pt. on medications + CAD and + CABG   Rhythm:Regular Rate:Normal + Systolic murmurs    Neuro/Psych negative neurological ROS  negative psych ROS   GI/Hepatic negative GI ROS, Neg liver ROS,   Endo/Other  negative endocrine ROS  Renal/GU negative Renal ROS  negative genitourinary   Musculoskeletal negative musculoskeletal ROS (+)   Abdominal   Peds negative pediatric ROS (+)  Hematology negative hematology ROS (+)   Anesthesia Other Findings   Reproductive/Obstetrics negative OB ROS                           Lab Results  Component Value Date   WBC 8.6 10/14/2015   HGB 12.3* 10/14/2015   HCT 36.5* 10/14/2015   MCV 84.3 10/14/2015   PLT 367 10/14/2015   Lab Results  Component Value Date   CREATININE 0.92 10/14/2015   BUN 10 10/14/2015   NA 136 10/14/2015   K 4.0 10/14/2015   CL 101 10/14/2015   CO2 26 10/14/2015   Lab Results  Component Value Date   INR 1.35 10/13/2015    09/2015 EKG: normal sinus rhythm.   Anesthesia Physical Anesthesia Plan  ASA: III  Anesthesia Plan: General   Post-op Pain Management:    Induction: Intravenous  Airway Management Planned: Oral ETT  Additional Equipment:   Intra-op Plan:   Post-operative Plan: Extubation in OR  Informed Consent: I have reviewed the patients History and Physical, chart, labs and discussed the procedure including the risks, benefits and alternatives for the proposed anesthesia with the patient or authorized representative  who has indicated his/her understanding and acceptance.   Dental advisory given  Plan Discussed with: CRNA  Anesthesia Plan Comments: (Possible LMA. No spinal secondary to moderate to severe AS. )       Anesthesia Quick Evaluation

## 2015-10-14 NOTE — H&P (View-Only) (Signed)
Justin Nakayama, MD  Justin Cruel, PA-C  Justin Dolly, PA-C                                  [facility]                [address], [place], [place]  [address]   Justin [name]            MRN:  [id number] DOB/SEX:  [birth year]male     CHIEF COMPLAINT:  Painful left hip  HISTORY: Justin [name] a 82 y.o. male with history of CAD and heart issues who fell ironically in a balance class today and could not get up.  Taken to ED via ambulance.  Justin [name] and his wife, Justin [name], are well known to me.  Admitted to medicine and ORS consulted about hip fracture management.   PAST MEDICAL HISTORY: Patient Active Problem List   Diagnosis Date Noted  . Closed left hip fracture ([place]) 10/13/2015  . Medicare annual wellness visit, subsequent 03/16/2015  . Paroxysmal atrial fibrillation ([place]) 09/26/2014  . Orthostatic hypotension 09/15/2014  . Syncope   . PAF (paroxysmal atrial fibrillation) ([place])   . Head trauma 09/14/2014  . Alteration in anticoagulation 09/14/2014  . Tinnitus 03/21/2014  . Epigastric pain 02/01/2013  . Gait instability 01/01/2013  . Disequilibrium 04/03/2012  . Hyperglycemia 09/13/2011  . Aortic stenosis 03/10/2011  . CAD, ARTERY BYPASS GRAFT 02/23/2010  . Hyperlipidemia 07/04/2008  . Essential hypertension 07/04/2008  . Coronary atherosclerosis 07/04/2008  . RHINITIS 07/04/2008  . BENIGN PROSTATIC HYPERTROPHY, WITH OBSTRUCTION 07/04/2008   Past Medical History  Diagnosis Date  . CAD (coronary artery disease)     a. H/o MI;  b. s/p CABG  . HTN (hypertension)   . Hyperlipidemia   . BPH (benign prostatic hypertrophy)   . Sigmoid diverticulitis   . Internal hemorrhoids   . Basal cell carcinoma   . Disequilibrium 04/03/2012  . Moderate aortic stenosis     a. 08/2014 Echo: EF 55-60%, no rwma, Gr 1 DD, mod AS, mildly dil asc Ao, mild MR, sev dil LA.  [name] Syncope     a. 08/2014 - ? orthostatic  . PAF (paroxysmal atrial fibrillation)  ([place])     a. Dx 08/2014;  b. CHA2DS2VASc = 4 - decision made to withold [place] 2/2 unsteady gait and syncope.  . Medicare annual wellness visit, subsequent 03/16/2015    Follows with cardiology, Dr Aundra Dubin, Dr Bing Neighbors with dermatology, Dr Altamese Cabal Aged out of colonoscopy   Past Surgical History  Procedure Laterality Date  . Coronary artery bypass graft    . Colonoscopy    . Shoulder surgery      x2     MEDICATIONS:   Current facility-administered medications:  .  acetaminophen (TYLENOL) tablet 325 mg, 325 mg, Oral, Q6H PRN, Louellen Molder, MD .  Derrill Memo ON 10/14/2015] cholecalciferol (VITAMIN D) tablet 1,000 Units, 1,000 Units, Oral, Daily, Nishant Dhungel, MD .  diltiazem (CARDIZEM CD) 24 hr capsule 120 mg, 120 mg, Oral, Daily, Nishant Dhungel, MD .  fentaNYL (SUBLIMAZE) injection 50 mcg, 50 mcg, Intravenous, Q30 min PRN, Leonard Schwartz, MD, 50 mcg at 10/13/15 1447 .  FLORA-Q (FLORA-Q) Capsule 1 capsule, 1 capsule, Oral, Daily, Nishant Dhungel, MD .  Derrill Memo ON 10/14/2015] hydrochlorothiazide (MICROZIDE) capsule 12.5 mg, 12.5 mg, Oral, Daily, Nishant Dhungel, MD .  HYDROcodone-acetaminophen (NORCO/VICODIN) 5-325 MG per tablet 1-2  tablet, 1-2 tablet, Oral, Q6H PRN, Nishant Dhungel, MD .  Melatonin TABS 3 mg, 3 mg, Oral, QHS, Nishant Dhungel, MD .  metoprolol tartrate (LOPRESSOR) tablet 25 mg, 25 mg, Oral, BID, Nishant Dhungel, MD .  morphine 2 MG/ML injection 0.5 mg, 0.5 mg, Intravenous, Q2H PRN, Nishant Dhungel, MD, 0.5 mg at 10/13/15 1634 .  multivitamin with minerals tablet 1 tablet, 1 tablet, Oral, Daily, Nishant Dhungel, MD .  pravastatin (PRAVACHOL) tablet 10 mg, 10 mg, Oral, q1800, Nishant Dhungel, MD .  senna (SENOKOT) tablet 8.6 mg, 1 tablet, Oral, BID, Nishant Dhungel, MD  ALLERGIES:   Allergies  Allergen Reactions  . Tramadol Hcl Other (See Comments)    Hallucinations     REVIEW OF SYSTEMS: REVIEWED IN DETAIL IN CHART  FAMILY HISTORY:   Family History  Problem  Relation Age of Onset  . Coronary artery disease Father   . Hyperlipidemia Father   . Heart attack Father   . Alcohol abuse Father   . Bipolar disorder Mother   . Heart disease Mother   . Alcohol abuse    . Alcohol abuse Brother   . Cancer Brother   . Cancer Maternal Grandfather     oral cancer, chew tobacco  . Cancer Sister   . Glaucoma Sister     SOCIAL HISTORY:   Social History  Substance Use Topics  . Smoking status: Former Smoker -- 0.50 packs/day for 15 years    Quit date: 06/29/1971  . Smokeless tobacco: Never Used  . Alcohol Use: Yes     Comment: 1-2 glasses of wine     EXAMINATION: Vital signs in last 24 hours: Temp:  [97.8 F (36.6 C)-97.9 F (36.6 C)] 97.8 F (36.6 C) (04/17 2002) Pulse Rate:  [64-74] 74 (04/17 2002) Resp:  [13-16] 16 (04/17 2002) BP: (128-173)/(75-93) 173/84 mmHg (04/17 2002) SpO2:  [98 %-100 %] 99 % (04/17 2002) Weight:  [136 lb (61.689 kg)] 136 lb (61.689 kg) (04/17 1723)  BP 173/84 mmHg  Pulse 74  Temp(Src) 97.8 F (36.6 C) (Oral)  Resp 16  Ht 5\' 5"  (1.651 m)  Wt 136 lb (61.689 kg)  BMI 22.63 kg/m2  SpO2 99%  General Appearance:    Alert, cooperative, no distress, appears stated age  Head:    Normocephalic, without obvious abnormality, atraumatic  Eyes:    PERRL, conjunctiva/corneas clear, EOM's intact, fundi    benign, both eyes       Ears:    Normal TM's and external ear canals, both ears  Nose:   Nares normal, septum midline, mucosa normal, no drainage    or sinus tenderness  Throat:   Lips, mucosa, and tongue normal; teeth and gums normal  Neck:   Supple, symmetrical, trachea midline, no adenopathy;       thyroid:  No enlargement/tenderness/nodules; no carotid   bruit or JVD  Back:     Symmetric, no curvature, ROM normal, no CVA tenderness  Lungs:     Clear to auscultation bilaterally, respirations unlabored  Chest wall:    No tenderness or deformity  Heart:    Regular rate and rhythm, S1 and S2 normal, no murmur, rub    or gallop  Abdomen:     Soft, non-tender, bowel sounds active all four quadrants,    no masses, no organomegaly  Genitalia:    Rectal:    Extremities:   Extremities normal, atraumatic, no cyanosis or edema  Pulses:   2+ and symmetric all extremities  Skin:  Skin color, texture, turgor normal, no rashes or lesions  Lymph nodes:   Cervical, supraclavicular, and axillary nodes normal  Neurologic:   CNII-XII intact. Normal strength, sensation and reflexes      throughout    Musculoskeletal Exam:   Left leg is SER. Other three extremities FROM and no pain   DIAGNOSTIC STUDIES: Recent laboratory studies:  Recent Labs  10/13/15 1315  WBC 7.2  HGB 13.2  HCT 39.1  PLT 421*    Recent Labs  10/13/15 1315  NA 133*  K 4.5  CL 100*  CO2 26  BUN 16  CREATININE 0.97  GLUCOSE 107*  CALCIUM 8.9   Lab Results  Component Value Date   INR 1.35 10/13/2015     Recent Radiographic Studies :  Dg Chest 1 View  10/13/2015  CLINICAL DATA:  Left hip fracture.  Pre operative respiratory exam. EXAM: CHEST 1 VIEW COMPARISON:  09/25/2015 FINDINGS: The heart size and pulmonary vascularity are normal. There arm multiple calcified lymph nodes in the mediastinum and hilar regions as well as at the lung bases. Slight linear scarring at the left base laterally. The lungs are otherwise clear. Fairly severe degenerative changes of both shoulders. CABG. IMPRESSION: No acute abnormalities. Electronically Signed   By: Lorriane Shire M.D.   On: 10/13/2015 13:45   Dg Chest 2 View  09/25/2015  CLINICAL DATA:  Back pain and cough for 3 days EXAM: CHEST  2 VIEW COMPARISON:  09/22/2015 FINDINGS: Cardiac shadow is stable. Changes of prior granulomatous disease are again noted. No focal infiltrate or sizable effusion is noted. Postsurgical changes are seen. Mild peribronchial cuffing is again identified likely of a chronic nature. A tiny right-sided pleural effusion is now seen. IMPRESSION: Tiny right-sided pleural  effusion. No other focal abnormality is seen. Prior granulomatous disease. Electronically Signed   By: Inez Catalina M.D.   On: 09/25/2015 17:42   Dg Chest 2 View  09/22/2015  CLINICAL DATA:  Acute onset of cough and sore throat. Generalized weakness and shortness of breath. Initial encounter. EXAM: CHEST  2 VIEW COMPARISON:  Chest radiograph from 01/05/2005 FINDINGS: The lungs are well-aerated. Peribronchial thickening is noted. There is no evidence of focal opacification, pleural effusion or pneumothorax. Calcified bilateral hilar and mediastinal nodes are again seen. The heart is normal in size; the patient is status post median sternotomy, with evidence of prior CABG. No acute osseous abnormalities are seen. IMPRESSION: 1. No acute cardiopulmonary process seen. 2. Calcified bilateral hilar and mediastinal nodes again seen, reflecting remote granulomatous disease. Peribronchial thickening noted. Electronically Signed   By: Garald Balding M.D.   On: 09/22/2015 03:36   Dg Hip Unilat With Pelvis 2-3 Views Left  10/13/2015  CLINICAL DATA:  Left hip pain after a fall while exercising. EXAM: DG HIP (WITH OR WITHOUT PELVIS) 2-3V LEFT COMPARISON:  None. FINDINGS: There is a subcapital fracture of the proximal right femur with slight proximal retraction of the distal fragment. The pelvic bones are intact. Extensive vascular calcification. IMPRESSION: Slightly retracted subcapital fracture of the proximal left femur. Electronically Signed   By: Lorriane Shire M.D.   On: 10/13/2015 13:44    ASSESSMENT:  Left femoral neck fracture   PLAN:  Needs hemi hip in order to sit up, get OOB, and potentially walk again.  Will plan on surgery tomorrow afternoon assuming cleared by cardiology.  On Eliquis and will hold for now though INR already in safe range.  Chaia Ikard G 10/13/2015, 8:13 PM

## 2015-10-14 NOTE — Anesthesia Postprocedure Evaluation (Signed)
Anesthesia Post Note  Patient: Justin [name]  Procedure(s) Performed: Procedure(s) (LRB): LEFT HIP HEMI ARTHROPLASTY (Left)  Patient location during evaluation: PACU Anesthesia Type: General Level of consciousness: awake and alert Pain management: pain level controlled Vital Signs Assessment: post-procedure vital signs reviewed and stable Respiratory status: spontaneous breathing, nonlabored ventilation, respiratory function stable and patient connected to nasal cannula oxygen Cardiovascular status: blood pressure returned to baseline and stable Postop Assessment: no signs of nausea or vomiting Anesthetic complications: no Comments: Alert but mildly confused consistent with baseline evaluation prior to surgery.     Last Vitals:  Filed Vitals:   10/14/15 1830 10/14/15 1831  BP:  154/74  Pulse:  69  Temp: 36.9 C   Resp:  17    Last Pain:  Filed Vitals:   10/14/15 1835  PainSc: [place]

## 2015-10-15 ENCOUNTER — Encounter (HOSPITAL_COMMUNITY): Payer: Self-pay | Admitting: Orthopaedic Surgery

## 2015-10-15 DIAGNOSIS — S72002D Fracture of unspecified part of neck of left femur, subsequent encounter for closed fracture with routine healing: Secondary | ICD-10-CM

## 2015-10-15 LAB — CBC WITH DIFFERENTIAL/PLATELET
BASOS ABS: 0 10*3/uL (ref 0.0–0.1)
Basophils Relative: 0 %
EOS ABS: 0.1 10*3/uL (ref 0.0–0.7)
EOS PCT: 1 %
HCT: 34.6 % — ABNORMAL LOW (ref 39.0–52.0)
HEMOGLOBIN: 11.5 g/dL — AB (ref 13.0–17.0)
LYMPHS ABS: 0.8 10*3/uL (ref 0.7–4.0)
LYMPHS PCT: 8 %
MCH: 27.8 pg (ref 26.0–34.0)
MCHC: 33.2 g/dL (ref 30.0–36.0)
MCV: 83.6 fL (ref 78.0–100.0)
Monocytes Absolute: 1.5 10*3/uL — ABNORMAL HIGH (ref 0.1–1.0)
Monocytes Relative: 15 %
NEUTROS PCT: 76 %
Neutro Abs: 7.7 10*3/uL (ref 1.7–7.7)
PLATELETS: 320 10*3/uL (ref 150–400)
RBC: 4.14 MIL/uL — AB (ref 4.22–5.81)
RDW: 14.4 % (ref 11.5–15.5)
WBC: 10 10*3/uL (ref 4.0–10.5)

## 2015-10-15 LAB — COMPREHENSIVE METABOLIC PANEL
ALK PHOS: 52 U/L (ref 38–126)
ALT: 13 U/L — AB (ref 17–63)
AST: 25 U/L (ref 15–41)
Albumin: 2.4 g/dL — ABNORMAL LOW (ref 3.5–5.0)
Anion gap: 8 (ref 5–15)
BUN: 14 mg/dL (ref 6–20)
CALCIUM: 8.5 mg/dL — AB (ref 8.9–10.3)
CHLORIDE: 99 mmol/L — AB (ref 101–111)
CO2: 25 mmol/L (ref 22–32)
CREATININE: 1.08 mg/dL (ref 0.61–1.24)
GFR calc non Af Amer: >60 mL/min (ref >60)
GLUCOSE: 134 mg/dL — AB (ref 65–99)
Potassium: 4 mmol/L (ref 3.5–5.1)
SODIUM: 132 mmol/L — AB (ref 135–145)
Total Bilirubin: 0.9 mg/dL (ref 0.3–1.2)
Total Protein: 5.5 g/dL — ABNORMAL LOW (ref 6.5–8.1)

## 2015-10-15 MED ORDER — APIXABAN 5 MG PO TABS
5.0000 mg | ORAL_TABLET | Freq: Two times a day (BID) | ORAL | Status: DC
  Administered 2015-10-15 – 2015-10-16 (×3): 5 mg via ORAL
  Filled 2015-10-15 (×3): qty 1

## 2015-10-15 MED ORDER — RISAQUAD PO CAPS
1.0000 | ORAL_CAPSULE | Freq: Every day | ORAL | Status: DC
  Administered 2015-10-15 – 2015-10-16 (×2): 1 via ORAL
  Filled 2015-10-15 (×2): qty 1

## 2015-10-15 MED ORDER — ACETAMINOPHEN 325 MG PO TABS: 325.0000 mg | ORAL_TABLET | Freq: Four times a day (QID) | ORAL | Status: DC | PRN

## 2015-10-15 MED ORDER — APIXABAN 5 MG PO TABS: 5.0000 mg | ORAL_TABLET | Freq: Two times a day (BID) | ORAL | Status: DC

## 2015-10-15 MED ORDER — ENSURE ENLIVE PO LIQD
237.0000 mL | Freq: Two times a day (BID) | ORAL | Status: DC
  Administered 2015-10-15 – 2015-10-16 (×2): 237 mL via ORAL

## 2015-10-15 NOTE — Progress Notes (Signed)
PROGRESS NOTE                                                                                                                                                                                                             Patient Demographics:    Justin [name], is a 82 y.o. male, DOB - 1928-08-21, [id number]  Admit date - 10/13/2015   Admitting Physician Louellen Molder, MD  Outpatient Primary MD for the patient is [name], MD  LOS - 2  Outpatient Specialists:Cardiology (Dr. Aundra Dubin)  Chief Complaint  Patient presents with  . Fall  . Hip deformity        Brief Narrative   82 year old male with history of coronary artery disease history of CABG, hypertension, BPH, moderate aortic stenosis who presented to the ED after sustaining a fall. Patient was in a group exercise program at the gym when he lost his balance and fell on the ground while trying to pick the exercise ball. Patient sustained subcapital fracture of the proximal left femur and was admitted for surgery.   Subjective:    Patient seen and examined. Reports pain in his left hip on movement.   Assessment  & Plan :    Principal Problem:   Closed left hip fracture ([place]) Status post left hemiarthroplasty on 4/18. Stable postop. Up with therapy. Pain control with when necessary Vicodin. On chronic Eliquis which is continued. Will need skilled nursing facility. Social work consulted. Weightbearing and therapy recommendations per surgery.  Active Problems: Paroxysmal A. fib Rate controlled. Continue Cardizem. Received perioperative beta blocker. Continue Eliquis.  Essential hypertension Blood pressure stable. Continue home medications    CAD with history of CABG/moderate aortic stenosis Stable. Continue statin. Follow-up with Dr. Aundra Dubin as outpatient.      Code Status : Full code  Family Communication  : Wife at bedside  Disposition Plan  :  Possibly discharge to skilled nursing facility on 4/20  Barriers For Discharge :   Consults  :  Orthopedics (Dr. Rhona Raider)  Procedures  :  Left hip hemiarthroplasty  DVT Prophylaxis  :  Eliquis  Lab Results  Component Value Date   PLT 320 10/15/2015    Antibiotics  :  Perioperative  Anti-infectives    Start     Dose/Rate Route Frequency Ordered Stop   10/14/15 2230  ceFAZolin (ANCEF)  IVPB 2g/100 mL premix     2 g 200 mL/hr over 30 Minutes Intravenous Every 6 hours 10/14/15 2009 10/15/15 0438   10/14/15 1400  ceFAZolin (ANCEF) 3 g in dextrose 5 % 50 mL IVPB     3 g 130 mL/hr over 30 Minutes Intravenous To [facility] 10/13/15 2139 10/14/15 1635        Objective:   Filed Vitals:   10/14/15 1831 10/14/15 1956 10/15/15 0040 10/15/15 0432  BP: 154/74 144/70 120/97 119/72  Pulse: 69 82 74 80  Temp:  97.5 F (36.4 C) 98.1 F (36.7 C) 97.7 F (36.5 C)  TempSrc:  Oral Axillary Axillary  Resp: 17 18 14 14   Height:      Weight:      SpO2: 100% 97% 96% 95%    Wt Readings from Last 3 Encounters:  10/13/15 61.689 kg (136 lb)  09/25/15 66.679 kg (147 lb)  09/22/15 66.679 kg (147 lb)     Intake/Output Summary (Last 24 hours) at 10/15/15 1210 Last data filed at 10/15/15 0500  Gross per 24 hour  Intake   1320 ml  Output    400 ml  Net    920 ml     Physical Exam  Gen: not in distress HEENT: no pallor, moist mucosa, supple neck Chest: clear b/l, no added sounds CVS: N [id number], 3/6 systolic murmur, rubs or gallop GI: soft, NT, ND, BS+ Musculoskeletal: warm, dressing over left hip, no edema CNS: Alert and oriented,     Data Review:    CBC  Recent Labs Lab 10/13/15 1315 10/14/15 0844 10/15/15 0438  WBC 7.2 8.6 10.0  HGB 13.2 12.3* 11.5*  HCT 39.1 36.5* 34.6*  PLT 421* 367 320  MCV 84.1 84.3 83.6  MCH 28.4 28.4 27.8  MCHC 33.8 33.7 33.2  RDW 14.6 14.5 14.4  LYMPHSABS 1.0  --  0.8  MONOABS 0.8  --  1.5*  EOSABS 0.1  --  0.1  BASOSABS 0.0  --   0.0    Chemistries   Recent Labs Lab 10/13/15 1315 10/14/15 0844 10/15/15 0438  NA 133* 136 132*  K 4.5 4.0 4.0  CL 100* 101 99*  CO2 26 26 25   GLUCOSE 107* 101* 134*  BUN 16 10 14   CREATININE 0.97 0.92 1.08  CALCIUM 8.9 9.0 8.5*  AST  --   --  25  ALT  --   --  13*  ALKPHOS  --   --  52  BILITOT  --   --  0.9   ------------------------------------------------------------------------------------------------------------------ No results for input(s): CHOL, HDL, LDLCALC, TRIG, CHOLHDL, LDLDIRECT in the last 72 hours.  Lab Results  Component Value Date   HGBA1C 5.9* 12/06/2011   ------------------------------------------------------------------------------------------------------------------ No results for input(s): TSH, T4TOTAL, T3FREE, THYROIDAB in the last 72 hours.  Invalid input(s): FREET3 ------------------------------------------------------------------------------------------------------------------ No results for input(s): VITAMINB12, FOLATE, FERRITIN, TIBC, IRON, RETICCTPCT in the last 72 hours.  Coagulation profile  Recent Labs Lab 10/13/15 1315  INR 1.35    No results for input(s): DDIMER in the last 72 hours.  Cardiac Enzymes No results for input(s): CKMB, TROPONINI, MYOGLOBIN in the last 168 hours.  Invalid input(s): CK ------------------------------------------------------------------------------------------------------------------ No results found for: BNP  Inpatient Medications  Scheduled Meds: . acidophilus  1 capsule Oral Daily  . apixaban  5 mg Oral BID  . bupivacaine liposome  20 mL Infiltration Once  . cholecalciferol  1,000 Units Oral Daily  . diltiazem  120 mg Oral Daily  .  hydrochlorothiazide  12.5 mg Oral Daily  . Melatonin  3 mg Oral QHS  . metoprolol tartrate  25 mg Oral BID  . multivitamin with minerals  1 tablet Oral Daily  . pravastatin  10 mg Oral q1800  . senna  1 tablet Oral BID  . tranexamic acid (CYKLOKAPRON) topical  -INTRAOP  2,000 mg Topical Once   Continuous Infusions:  PRN Meds:.acetaminophen **OR** acetaminophen, fentaNYL (SUBLIMAZE) injection, HYDROcodone-acetaminophen, menthol-cetylpyridinium **OR** phenol, methocarbamol **OR** methocarbamol (ROBAXIN)  IV, metoCLOPramide **OR** metoCLOPramide (REGLAN) injection, morphine injection, ondansetron **OR** ondansetron (ZOFRAN) IV  Micro Results Recent Results (from the past 240 hour(s))  Surgical pcr screen     Status: None   Collection Time: 10/13/15 10:14 PM  Result Value Ref Range Status   MRSA, PCR NEGATIVE NEGATIVE Final   Staphylococcus aureus NEGATIVE NEGATIVE Final    Comment:        The Xpert SA Assay (FDA approved for NASAL specimens in patients over 77 years of age), is one component of a comprehensive surveillance program.  Test performance has been validated by [facility] for patients greater than or equal to 33 year old. It is not intended to diagnose infection nor to guide or monitor treatment.     Radiology Reports Dg Chest 1 View  10/13/2015  CLINICAL DATA:  Left hip fracture.  Pre operative respiratory exam. EXAM: CHEST 1 VIEW COMPARISON:  09/25/2015 FINDINGS: The heart size and pulmonary vascularity are normal. There arm multiple calcified lymph nodes in the mediastinum and hilar regions as well as at the lung bases. Slight linear scarring at the left base laterally. The lungs are otherwise clear. Fairly severe degenerative changes of both shoulders. CABG. IMPRESSION: No acute abnormalities. Electronically Signed   By: Lorriane Shire M.D.   On: 10/13/2015 13:45   Dg Chest 2 View  09/25/2015  CLINICAL DATA:  Back pain and cough for 3 days EXAM: CHEST  2 VIEW COMPARISON:  09/22/2015 FINDINGS: Cardiac shadow is stable. Changes of prior granulomatous disease are again noted. No focal infiltrate or sizable effusion is noted. Postsurgical changes are seen. Mild peribronchial cuffing is again identified likely of a chronic nature. A  tiny right-sided pleural effusion is now seen. IMPRESSION: Tiny right-sided pleural effusion. No other focal abnormality is seen. Prior granulomatous disease. Electronically Signed   By: Inez Catalina M.D.   On: 09/25/2015 17:42   Dg Chest 2 View  09/22/2015  CLINICAL DATA:  Acute onset of cough and sore throat. Generalized weakness and shortness of breath. Initial encounter. EXAM: CHEST  2 VIEW COMPARISON:  Chest radiograph from 01/05/2005 FINDINGS: The lungs are well-aerated. Peribronchial thickening is noted. There is no evidence of focal opacification, pleural effusion or pneumothorax. Calcified bilateral hilar and mediastinal nodes are again seen. The heart is normal in size; the patient is status post median sternotomy, with evidence of prior CABG. No acute osseous abnormalities are seen. IMPRESSION: 1. No acute cardiopulmonary process seen. 2. Calcified bilateral hilar and mediastinal nodes again seen, reflecting remote granulomatous disease. Peribronchial thickening noted. Electronically Signed   By: Garald Balding M.D.   On: 09/22/2015 03:36   Dg Hip Unilat With Pelvis 2-3 Views Left  10/13/2015  CLINICAL DATA:  Left hip pain after a fall while exercising. EXAM: DG HIP (WITH OR WITHOUT PELVIS) 2-3V LEFT COMPARISON:  None. FINDINGS: There is a subcapital fracture of the proximal right femur with slight proximal retraction of the distal fragment. The pelvic bones are intact. Extensive vascular calcification. IMPRESSION:  Slightly retracted subcapital fracture of the proximal left femur. Electronically Signed   By: Lorriane Shire M.D.   On: 10/13/2015 13:44    Time Spent in minutes  25   Louellen Molder M.D on 10/15/2015 at 12:10 PM  Between 7am to 7pm - Pager - [phone]  After 7pm go to [email or web address] - password [facility]  [facility] -  Office  [phone]

## 2015-10-15 NOTE — Progress Notes (Signed)
Physical Therapy Treatment Patient Details Name: Justin [name] MRN: [id number] DOB: [birth year] Today's Date: 10/15/2015    History of Present Illness 82 yo male with onset of R hip fracture with hemiarthroplasty w/ immobilizer in place. Pt sustained a fall taking a balance class.  PMHx:  PAF, head trauma, CAD, CABG,      PT Comments    Pt was able to get up to walk with PT after having a rest from getting back to bed.   Very motivated to try and needs considerable assist to stand, will benefit from having PT work on gait to chair and strengthening to continue on his L hip.  Follow Up Recommendations  SNF     Equipment Recommendations  Rolling walker with 5" wheels    Recommendations for Other Services Rehab consult     Precautions / Restrictions Precautions Precautions: Fall;Posterior Hip (telemetry) Precaution Booklet Issued: Yes (comment) Precaution Comments: reviewed, did not know them Required Braces or Orthoses: Knee Immobilizer - Right Knee Immobilizer - Right: On at all times Restrictions Weight Bearing Restrictions: Yes LLE Weight Bearing: Weight bearing as tolerated    Mobility  Bed Mobility Overal bed mobility: Needs Assistance Bed Mobility: Supine to Sit     Supine to sit: Min assist;Mod assist     General bed mobility comments: using bed rail and HOB elevated  Transfers Overall transfer level: Needs assistance Equipment used: Rolling walker (2 wheeled);1 person hand held assist Transfers: Sit to/from Stand Sit to Stand: Min assist;Mod assist         General transfer comment: reminders for hand placement  Ambulation/Gait Ambulation/Gait assistance: Min assist Ambulation Distance (Feet): 6 Feet Assistive device: Rolling walker (2 wheeled);1 person hand held assist Gait Pattern/deviations: Step-to pattern;Shuffle;Decreased weight shift to left;Wide base of support Gait velocity: reduced Gait velocity interpretation: Below normal speed for  age/gender General Gait Details: has better control of feet this afternoon but awkward with brace   Stairs            Wheelchair Mobility    Modified Rankin (Stroke Patients Only)       Balance     Sitting balance-Leahy Scale: Fair     Standing balance support: Bilateral upper extremity supported Standing balance-Leahy Scale: Poor                      Cognition Arousal/Alertness: Awake/alert Behavior During Therapy: WFL for tasks assessed/performed Overall Cognitive Status: History of cognitive impairments - at baseline       Memory: Decreased short-term memory              Exercises Total Joint Exercises Ankle Circles/Pumps: AROM;Both;5 reps Quad Sets: AROM;Both;10 reps Gluteal Sets: AROM;Both;10 reps Heel Slides: AROM;Right;10 reps Hip ABduction/ADduction: AAROM;Both;10 reps    General Comments General comments (skin integrity, edema, etc.): Pt is in bed with water spilled everywhere on the bed, notified nursing to assist to change linen      Pertinent Vitals/Pain Pain Assessment: Faces Faces Pain Scale: Hurts little more Pain Location: L hip Pain Descriptors / Indicators: Aching Pain Intervention(s): Premedicated before session;Repositioned    Home Living                      Prior Function            PT Goals (current goals can now be found in the care plan section) Acute Rehab PT Goals Patient Stated Goal: to get up to  walk PT  Goal Formulation: With patient/family Time For Goal Achievement: 10/29/15 Potential to Achieve Goals: Fair    Frequency  7X/week    PT Plan      Co-evaluation             End of Session   Activity Tolerance: Patient limited by fatigue;Patient limited by pain Patient left: with call bell/phone within reach;in bed;with bed alarm set     Time: [id number] PT Time Calculation (min) (ACUTE ONLY): 22 min  Charges:  $Therapeutic Exercise: 8-22 mins                    G Codes:       Ramond Dial Oct 31, 2015, 4:10 PM   Mee Hives, PT MS Acute Rehab Dept. Number: [facility] [id number] and [place] [phone]

## 2015-10-15 NOTE — Plan of Care (Signed)
Problem: Acute Rehab PT Goals(only PT should resolve) Goal: Pt Will Go Supine/Side To Sit And maintaining Posterior hip precautions Goal: Pt Will Ambulate Maintaining total  Hip precautions

## 2015-10-15 NOTE — Progress Notes (Addendum)
Subjective: 1 Day Post-Op Procedure(s) (LRB): LEFT HIP HEMI ARTHROPLASTY (Left)   Patient was a little confused this morning when he woke up but it seems to be clearing. He knew what year it was, who his doctor was, why he was here, but he did think he was at rehab at first. He admitted that he was confused this mornign but that he seems to be feeling better, he is only taking tylenol for pain.  Activity level:  wbat with posterior hip precautions. Diet tolerance:  ok Voiding:  ok Patient reports pain as mild.    Objective: Vital signs in last 24 hours: Temp:  [97.5 F (36.4 C)-98.4 F (36.9 C)] 97.7 F (36.5 C) (04/19 0432) Pulse Rate:  [64-82] 80 (04/19 0432) Resp:  [14-18] 14 (04/19 0432) BP: (119-158)/(70-97) 119/72 mmHg (04/19 0432) SpO2:  [95 %-100 %] 95 % (04/19 0432)  Labs:  Recent Labs  10/13/15 1315 10/14/15 0844 10/15/15 0438  HGB 13.2 12.3* 11.5*    Recent Labs  10/14/15 0844 10/15/15 0438  WBC 8.6 10.0  RBC 4.33 4.14*  HCT 36.5* 34.6*  PLT 367 320    Recent Labs  10/14/15 0844 10/15/15 0438  NA 136 132*  K 4.0 4.0  CL 101 99*  CO2 26 25  BUN 10 14  CREATININE 0.92 1.08  GLUCOSE 101* 134*  CALCIUM 9.0 8.5*    Recent Labs  10/13/15 1315  INR 1.35    Physical Exam:  Neurologically intact ABD soft Neurovascular intact Sensation intact distally Intact pulses distally Dorsiflexion/Plantar flexion intact Incision: dressing C/D/I and no drainage No cellulitis present Compartment soft  Assessment/Plan:  1 Day Post-Op Procedure(s) (LRB): LEFT HIP HEMI ARTHROPLASTY (Left) Advance diet Up with therapy Discharge to SNF [facility] where he currently resides probably tomorrow if the confusion clears, medicine team thiks its safe, and if PT clears him. Continue on home eliquis for DVT prevention. Tylenol only for pain. Continue with IV fluids to help clear and flush his system. Continue Posterior hip precautions. Follow up in office  2 weeks post op.  Tanayia Wahlquist, Larwance Sachs 10/15/2015, 7:47 AM

## 2015-10-15 NOTE — Progress Notes (Addendum)
Nutrition Follow-up  DOCUMENTATION CODES:   Not applicable  INTERVENTION:  Provide Ensure Enlive po BID, each supplement provides 350 kcal and 20 grams of protein.  Encourage adequate PO intake.   NUTRITION DIAGNOSIS:   Increased nutrient needs related to wound healing as evidenced by estimated needs; ongoing  GOAL:   Patient will meet greater than or equal to 90% of their needs; progressing  MONITOR:   PO intake, Supplement acceptance, Weight trends, Labs, I & O's  REASON FOR ASSESSMENT:   Consult Assessment of nutrition requirement/status  ASSESSMENT:   82 yo male with CAD s/p CABG and moderate AS, suffered a mechanical fall and left hip fracture which will require operative repair. He is followed by Dr Aundra Dubin and has been stable from a cardiac perspective. The patient exercises regularly without exertional symptoms.    Procedure(4/18): LEFT HIP HEMI ARTHROPLASTY (Left)  Diet has been advanced. Pt reports he did not get a meal tray this AM and was disappointed, however a lunch tray had been ordered. Pt reports having a decreased appetite. RD to order Ensure to aid in caloric and protein needs. Pt and wife at beside recommended to start nutritional supplementation at home especially if po intake is poor.   Labs and medications reviewed.   Diet Order:  Diet regular Room service appropriate?: Yes; Fluid consistency:: Thin  Skin:   (Incision on L hip)  Last BM:  4/17  Height:   Ht Readings from Last 1 Encounters:  10/13/15 5\' 5"  (1.651 m)    Weight:   Wt Readings from Last 1 Encounters:  10/13/15 136 lb (61.689 kg)    Ideal Body Weight:  56.81 kg  BMI:  Body mass index is 22.63 kg/(m^2).  Estimated Nutritional Needs:   Kcal:  1550-1850  Protein:  60-75 grams  Fluid:  >/= 1.5L  EDUCATION NEEDS:   No education needs identified at this time  Corrin Parker, MS, RD, LDN Pager # [phone] After hours/ weekend pager # [phone]

## 2015-10-15 NOTE — Evaluation (Signed)
Physical Therapy Evaluation Patient Details Name: BRENNAN FRATTINI MRN: [id number] DOB: September 04, 1928 Today's Date: 10/15/2015   History of Present Illness  82 yo male with onset of R hip fracture with hemiarthroplasty w/ immobilizer in place. Pt sustained a fall taking a balance class.  PMHx:  PAF, head trauma, CAD, CABG,    Clinical Impression  Pt was able to be assisted to step to chair with PT and maintained posterior precautions with hemiarthroplasty.  He is very unsteady with his prior head injury, but will be able to work on this in inpt therapy.    Follow Up Recommendations SNF    Equipment Recommendations  Rolling walker with 5" wheels    Recommendations for Other Services Rehab consult     Precautions / Restrictions Precautions Precautions: Fall;Posterior Hip (telemetry) Precaution Booklet Issued: Yes (comment) Precaution Comments: reviewed, did not know them Required Braces or Orthoses: Knee Immobilizer - Right Knee Immobilizer - Right: On at all times Restrictions Weight Bearing Restrictions: Yes LLE Weight Bearing: Weight bearing as tolerated      Mobility  Bed Mobility Overal bed mobility: Needs Assistance Bed Mobility: Supine to Sit     Supine to sit: Min assist;Mod assist     General bed mobility comments: using bed rail and HOB elevated  Transfers Overall transfer level: Needs assistance Equipment used: Rolling walker (2 wheeled);1 person hand held assist Transfers: Sit to/from [facility] Sit to Stand: Mod assist Stand pivot transfers: Mod assist       General transfer comment: reminders for hand placement and help to power up then to steady himself  Ambulation/Gait Ambulation/Gait assistance: Min assist;Mod assist Ambulation Distance (Feet): 3 Feet Assistive device: Rolling walker (2 wheeled);1 person hand held assist Gait Pattern/deviations: Step-to pattern;Wide base of support;Trunk flexed Gait velocity: reduced Gait velocity  interpretation: Below normal speed for age/gender General Gait Details: Pt is shuffling but this was his issue prior to fall in balance class  Stairs            Wheelchair Mobility    Modified Rankin (Stroke Patients Only)       Balance Overall balance assessment: Needs assistance Sitting-balance support: Feet supported;Bilateral upper extremity supported Sitting balance-Leahy Scale: Fair     Standing balance support: Bilateral upper extremity supported Standing balance-Leahy Scale: Poor                               Pertinent Vitals/Pain Pain Assessment: 0-10 Pain Score: 3  Pain Location: L hip Pain Descriptors / Indicators: Aching Pain Intervention(s): Limited activity within patient's tolerance;Monitored during session;Premedicated before session;Repositioned    Home Living Family/patient expects to be discharged to:: Skilled nursing facility                 Additional Comments: Pt lives with wife in a retirement community    Prior Function Level of Independence: Independent with assistive device(s)               Hand Dominance        Extremity/Trunk Assessment   Upper Extremity Assessment: Overall WFL for tasks assessed           Lower Extremity Assessment: LLE deficits/detail   LLE Deficits / Details: LL E in immobilizer and in pain  Cervical / Trunk Assessment: Normal  Communication   Communication: No difficulties  Cognition Arousal/Alertness: Awake/alert Behavior During Therapy: WFL for tasks assessed/performed Overall Cognitive Status: History of cognitive  impairments - at baseline       Memory: Decreased short-term memory              General Comments General comments (skin integrity, edema, etc.): Pt is getting up to chair with his wife availiable who is very supportive, but did not walk far as he is restrained by brace and his prior closed head injury    Exercises Total Joint Exercises Ankle  Circles/Pumps: AROM;Both;5 reps Quad Sets: AROM;Both;10 reps      Assessment/Plan    PT Assessment Patient needs continued PT services  PT Diagnosis Abnormality of gait;Acute pain   PT Problem List Decreased strength;Decreased range of motion;Decreased activity tolerance;Decreased balance;Decreased mobility;Decreased coordination;Decreased safety awareness;Cardiopulmonary status limiting activity;Decreased knowledge of precautions;Decreased skin integrity;Pain  PT Treatment Interventions DME instruction;Gait training;Functional mobility training;Therapeutic activities;Therapeutic exercise;Balance training;Neuromuscular re-education;Patient/family education   PT Goals (Current goals can be found in the Care Plan section) Acute Rehab PT Goals Patient Stated Goal: to get up to  walk PT Goal Formulation: With patient/family Time For Goal Achievement: 10/29/15 Potential to Achieve Goals: Fair    Frequency 7X/week   Barriers to discharge Inaccessible home environment;Decreased caregiver support pt has two person assist needed to walk outside to get home and wife will not be able to provide this assistance    Co-evaluation               End of Session Equipment Utilized During Treatment: Gait belt Activity Tolerance: Patient limited by fatigue;Patient limited by pain Patient left: in chair;with call bell/phone within reach;with family/visitor present;with chair alarm set Nurse Communication: Mobility status;Other (comment) (instructions for transfer back to bed)         Time: [id number] PT Time Calculation (min) (ACUTE ONLY): 39 min   Charges:   PT Evaluation $PT Eval Moderate Complexity: 1 Procedure PT Treatments $Gait Training: 8-22 mins   PT G Codes:        Ramond Dial 10-31-2015, 1:38 PM   Mee Hives, PT MS Acute Rehab Dept. Number: [facility] [id number] and [place] [phone]

## 2015-10-16 DIAGNOSIS — I480 Paroxysmal atrial fibrillation: Secondary | ICD-10-CM | POA: Diagnosis not present

## 2015-10-16 DIAGNOSIS — W19XXXD Unspecified fall, subsequent encounter: Secondary | ICD-10-CM | POA: Diagnosis not present

## 2015-10-16 DIAGNOSIS — S72009A Fracture of unspecified part of neck of unspecified femur, initial encounter for closed fracture: Secondary | ICD-10-CM | POA: Diagnosis not present

## 2015-10-16 DIAGNOSIS — I350 Nonrheumatic aortic (valve) stenosis: Secondary | ICD-10-CM | POA: Diagnosis not present

## 2015-10-16 DIAGNOSIS — W19XXXA Unspecified fall, initial encounter: Secondary | ICD-10-CM | POA: Insufficient documentation

## 2015-10-16 DIAGNOSIS — R2681 Unsteadiness on feet: Secondary | ICD-10-CM | POA: Diagnosis not present

## 2015-10-16 DIAGNOSIS — R262 Difficulty in walking, not elsewhere classified: Secondary | ICD-10-CM | POA: Diagnosis not present

## 2015-10-16 DIAGNOSIS — M6281 Muscle weakness (generalized): Secondary | ICD-10-CM | POA: Diagnosis not present

## 2015-10-16 DIAGNOSIS — I10 Essential (primary) hypertension: Secondary | ICD-10-CM | POA: Diagnosis not present

## 2015-10-16 DIAGNOSIS — S72002D Fracture of unspecified part of neck of left femur, subsequent encounter for closed fracture with routine healing: Secondary | ICD-10-CM | POA: Diagnosis not present

## 2015-10-16 DIAGNOSIS — S72002A Fracture of unspecified part of neck of left femur, initial encounter for closed fracture: Secondary | ICD-10-CM | POA: Diagnosis not present

## 2015-10-16 DIAGNOSIS — Z471 Aftercare following joint replacement surgery: Secondary | ICD-10-CM | POA: Diagnosis not present

## 2015-10-16 DIAGNOSIS — Z96642 Presence of left artificial hip joint: Secondary | ICD-10-CM | POA: Diagnosis not present

## 2015-10-16 LAB — CBC
HEMATOCRIT: 31.9 % — AB (ref 39.0–52.0)
HEMOGLOBIN: 10.6 g/dL — AB (ref 13.0–17.0)
MCH: 27.5 pg (ref 26.0–34.0)
MCHC: 33.2 g/dL (ref 30.0–36.0)
MCV: 82.9 fL (ref 78.0–100.0)
PLATELETS: 303 10*3/uL (ref 150–400)
RBC: 3.85 MIL/uL — AB (ref 4.22–5.81)
RDW: 14.2 % (ref 11.5–15.5)
WBC: 10 10*3/uL (ref 4.0–10.5)

## 2015-10-16 LAB — BASIC METABOLIC PANEL
ANION GAP: 10 (ref 5–15)
BUN: 14 mg/dL (ref 6–20)
CO2: 27 mmol/L (ref 22–32)
Calcium: 8.5 mg/dL — ABNORMAL LOW (ref 8.9–10.3)
Chloride: 95 mmol/L — ABNORMAL LOW (ref 101–111)
Creatinine, Ser: 0.89 mg/dL (ref 0.61–1.24)
GFR calc Af Amer: >60 mL/min (ref >60)
GLUCOSE: 116 mg/dL — AB (ref 65–99)
POTASSIUM: 3.8 mmol/L (ref 3.5–5.1)
Sodium: 132 mmol/L — ABNORMAL LOW (ref 135–145)

## 2015-10-16 MED ORDER — SENNA 8.6 MG PO TABS: 1.0000 | ORAL_TABLET | Freq: Two times a day (BID) | ORAL | Status: DC

## 2015-10-16 MED ORDER — HYDROCODONE-ACETAMINOPHEN 5-325 MG PO TABS: 1.0000 | ORAL_TABLET | Freq: Four times a day (QID) | ORAL | Status: DC | PRN

## 2015-10-16 MED ORDER — ENSURE ENLIVE PO LIQD: 237.0000 mL | Freq: Two times a day (BID) | ORAL | Status: DC

## 2015-10-16 NOTE — Clinical Social Work Note (Signed)
Clinical Social Work Assessment  Patient Details  Name: Justin [name] MRN: [id number] Date of Birth: 07/17/1928  Date of referral:  10/16/15               Reason for consult:  Facility Placement, Discharge Planning                Permission sought to share information with:  Facility Sport and exercise psychologist, Family Supports Permission granted to share information::  Yes, Verbal Permission Granted  Name::     [employer]::  [name]  Relationship::  Spouse  Contact Information:     Housing/Transportation Living arrangements for the past 2 months:  Charity fundraiser of Information:  Patient, Spouse Patient Interpreter Needed:  None Criminal Activity/Legal Involvement Pertinent to Current Situation/Hospitalization:  No - Comment as needed Significant Relationships:  Spouse Lives with:  Spouse Do you feel safe going back to the place where you live?  Yes Need for family participation in patient care:  Yes (Comment) (Patient's wife active with patient's care.)  Care giving concerns:  Patient nor patient's wife expressed concerns at this time.   Social Worker assessment / plan:  CSW received referral stating possible SNF placement at time of discharge. Per patient, patient and patient's wife currently reside at [facility] in [place]. Patient agreeable to return to [name] under short-term rehabilitation. CSW to continue to follow and assist with discharge planning needs.  Employment status:  Retired Forensic scientist:  [employer]) PT Recommendations:  [place] / Referral to community resources:  [place]  Patient/Family's Response to care:  Patient and patient's wife understanding and agreeable to CSW plan of care.  Patient/Family's Understanding of and Emotional Response to Diagnosis, Current Treatment, and Prognosis:  Patient and patient's wife understanding and  agreeable to CSW plan of care.  Emotional Assessment Appearance:  Appears stated age Attitude/Demeanor/Rapport:  Other (Appropriate) Affect (typically observed):  Accepting, Appropriate, Pleasant Orientation:  Oriented to Self, Oriented to Place, Oriented to  Time, Oriented to Situation Alcohol / Substance use:  Not Applicable Psych involvement (Current and /or in the community):  No (Comment) (Not appropriate on this admission.)  Discharge Needs  Concerns to be addressed:  No discharge needs identified Readmission within the last 30 days:  No Current discharge risk:  None Barriers to Discharge:  No Barriers Identified   Caroline Sauger, LCSW 10/16/2015, 11:28 AM [phone]

## 2015-10-16 NOTE — Clinical Social Work Note (Signed)
Patient to be discharged to [name]. Patient and patient's wife updated regarding discharge. Patient to be transported via EMS.  RN report number: [phone] [facility], [place] [employer] authorization: [id number]  Lubertha Sayres, [facility]: [phone] Surgical: [phone]

## 2015-10-16 NOTE — Care Management Important Message (Signed)
Important Message  Patient Details  Name: Justin [name] MRN: [id number] Date of Birth: 02-28-1929   Medicare Important Message Given:  Yes    [name] P [name] 10/16/2015, 4:17 PM

## 2015-10-16 NOTE — Evaluation (Signed)
Occupational Therapy Evaluation Patient Details Name: Justin [name] MRN: [id number] DOB: 01-18-1929 Today's Date: 10/16/2015    History of Present Illness 82 yo male with onset of R hip fracture with hemiarthroplasty w/ immobilizer in place. Pt sustained a fall taking a balance class.  PMHx:  PAF, head trauma, CAD, CABG,     Clinical Impression   Pt reports he was independent with ADLs PTA. Currently pt overall max assist for sit to stand, mod assist for stand pivot transfers, and min-max assist overall for ADLs. Educated pt on posterior hip precautions. Pt planning d/c to SNF [facility]) for follow up; agree with short term SNF placement for continued rehab prior to d/c home. Will defer all further OT needs to next venue of care. Thank you for this referral.    Follow Up Recommendations  SNF;Supervision/Assistance - 24 hour    Equipment Recommendations  Other (comment) (TBD at next venue)    Recommendations for Other Services       Precautions / Restrictions Precautions Precautions: Fall;Posterior Hip Precaution Comments: Reviewed precautions with pt; unable to recall any at start of session Required Braces or Orthoses: Knee Immobilizer - Right Knee Immobilizer - Right: On at all times Restrictions Weight Bearing Restrictions: Yes LLE Weight Bearing: Weight bearing as tolerated      Mobility Bed Mobility Overal bed mobility: Needs Assistance Bed Mobility: Supine to Sit     Supine to sit: Mod assist;HOB elevated     General bed mobility comments: Assist for LLE and raising trunk. VCs throughout for hand placement and technique. HOB elevated with use of bed rail.  Transfers Overall transfer level: Needs assistance Equipment used: Rolling walker (2 wheeled) Transfers: Sit to/from [facility] Sit to Stand: Max assist Stand pivot transfers: Mod assist       General transfer comment: VCs for hand placement throughout.    Balance Overall balance  assessment: Needs assistance Sitting-balance support: Feet supported;Bilateral upper extremity supported Sitting balance-Leahy Scale: Fair     Standing balance support: Bilateral upper extremity supported Standing balance-Leahy Scale: Poor Standing balance comment: RW for support                            ADL Overall ADL's : Needs assistance/impaired Eating/Feeding: Set up;Sitting   Grooming: Minimal assistance;Sitting   Upper Body Bathing: Minimal assitance;Sitting Upper Body Bathing Details (indicate cue type and reason): for balance in sitting Lower Body Bathing: Maximal assistance;Sit to/from stand   Upper Body Dressing : Minimal assistance;Sitting   Lower Body Dressing: Maximal assistance;Sit to/from stand   Toilet Transfer: Maximal assistance;Stand-pivot;BSC;RW (max for sit to stand; mod for stand pivot)   Toileting- Clothing Manipulation and Hygiene: Total assistance;Sit to/from stand       Functional mobility during ADLs: Moderate assistance;Rolling walker (Max for sit to stand, mod for stand pivot)       Vision Vision Assessment?: No apparent visual deficits   Perception     Praxis      Pertinent Vitals/Pain Pain Assessment: Faces Faces Pain Scale: Hurts even more Pain Location: L hip with movement Pain Descriptors / Indicators: Aching;Grimacing Pain Intervention(s): Limited activity within patient's tolerance;Monitored during session;Repositioned;Ice applied     Hand Dominance     Extremity/Trunk Assessment Upper Extremity Assessment Upper Extremity Assessment: Generalized weakness   Lower Extremity Assessment Lower Extremity Assessment: Defer to PT evaluation   Cervical / Trunk Assessment Cervical / Trunk Assessment: Normal   Communication Communication  Communication: No difficulties   Cognition Arousal/Alertness: Awake/alert Behavior During Therapy: WFL for tasks assessed/performed Overall Cognitive Status: History of cognitive  impairments - at baseline       Memory: Decreased short-term memory             General Comments       Exercises       Shoulder Instructions      Home Living Family/patient expects to be discharged to:: Skilled nursing facility [facility])                                 Additional Comments: Pt lives with wife in a retirement community      Prior Functioning/Environment Level of Independence: Independent with assistive device(s)             OT Diagnosis: Generalized weakness;Acute pain   OT Problem List:     OT Treatment/Interventions:      OT Goals(Current goals can be found in the care plan section) Acute Rehab OT Goals Patient Stated Goal: rehab today OT Goal Formulation: With patient/family  OT Frequency:     Barriers to D/C:            Co-evaluation              End of Session Equipment Utilized During Treatment: Gait belt;Rolling walker;Left knee immobilizer  Activity Tolerance: Patient tolerated treatment well Patient left: in chair;with call bell/phone within reach;with chair alarm set;with family/visitor present   Time: [id number] OT Time Calculation (min): 25 min Charges:  OT General Charges $OT Visit: 1 Procedure OT Evaluation $OT Eval Moderate Complexity: 1 Procedure OT Treatments $Self Care/Home Management : 8-22 mins G-Codes:     Binnie Kand M.S., OTR/L Pager: [phone]  10/16/2015, 10:09 AM

## 2015-10-16 NOTE — NC FL2 (Signed)
[place] MEDICAID FL2 LEVEL OF CARE SCREENING TOOL     IDENTIFICATION  Patient Name: Justin [name] Birthdate: Jun 02, 1929 Sex: male Admission Date (Current Location): 10/13/2015  [facility] and [place] Number:  Herbalist and Address:  [facility] [place] [place], [place] [address], [place], [place] [address]      Provider Number: [id number]  Attending Physician Name and Address:  Louellen Molder, MD  Relative Name and Phone Number:       Current Level of Care: SNF Recommended Level of Care: [place] Prior Approval Number:    Date Approved/Denied:   PASRR Number: [id number]  Discharge Plan: SNF    Current Diagnoses: Patient Active Problem List   Diagnosis Date Noted  . Fall   . Closed left hip fracture ([place]) 10/13/2015  . Medicare annual wellness visit, subsequent 03/16/2015  . Paroxysmal atrial fibrillation ([place]) 09/26/2014  . Orthostatic hypotension 09/15/2014  . Syncope   . PAF (paroxysmal atrial fibrillation) ([place])   . Head trauma 09/14/2014  . Alteration in anticoagulation 09/14/2014  . Tinnitus 03/21/2014  . Epigastric pain 02/01/2013  . Gait instability 01/01/2013  . Disequilibrium 04/03/2012  . Hyperglycemia 09/13/2011  . Aortic stenosis 03/10/2011  . CAD, ARTERY BYPASS GRAFT 02/23/2010  . Hyperlipidemia 07/04/2008  . Essential hypertension 07/04/2008  . Coronary atherosclerosis 07/04/2008  . RHINITIS 07/04/2008  . BENIGN PROSTATIC HYPERTROPHY, WITH OBSTRUCTION 07/04/2008    Orientation RESPIRATION BLADDER Height & Weight     Self, Time, Situation, Place    Continent Weight: 136 lb (61.689 kg) Height:  5\' 5"  (165.1 cm)  BEHAVIORAL SYMPTOMS/MOOD NEUROLOGICAL BOWEL NUTRITION STATUS      Continent Diet (Please see discharge summary.)  AMBULATORY STATUS COMMUNICATION OF NEEDS Skin   Limited Assist Verbally Surgical wounds                       Personal Care Assistance Level of Assistance  Bathing, Feeding,  Dressing Bathing Assistance: Limited assistance Feeding assistance: Independent Dressing Assistance: Limited assistance     Functional Limitations Info             SPECIAL CARE FACTORS FREQUENCY  PT (By licensed PT), OT (By licensed OT)     PT Frequency: 5 OT Frequency: 5            Contractures      Additional Factors Info  Code Status, Allergies Code Status Info: FULL Allergies Info: Toradol           Current Medications (10/16/2015):  This is the current hospital active medication list Current Facility-Administered Medications  Medication Dose Route Frequency Provider Last Rate Last Dose  . acetaminophen (TYLENOL) tablet 650 mg  650 mg Oral Q6H PRN Loni Dolly, PA-C   650 mg at 10/15/15 2121   Or  . acetaminophen (TYLENOL) suppository 650 mg  650 mg Rectal Q6H PRN Loni Dolly, PA-C      . acidophilus (RISAQUAD) capsule 1 capsule  1 capsule Oral Daily Franky Macho, RPH   1 capsule at 10/16/15 1003  . apixaban (ELIQUIS) tablet 5 mg  5 mg Oral BID Loni Dolly, PA-C   5 mg at 10/16/15 1008  . bupivacaine liposome (EXPAREL) 1.3 % injection 266 mg  20 mL Infiltration Once [name], MD      . cholecalciferol (VITAMIN D) tablet 1,000 Units  1,000 Units Oral Daily Nishant Dhungel, MD   1,000 Units at 10/16/15 1004  . diltiazem (CARDIZEM CD)  24 hr capsule 120 mg  120 mg Oral Daily Nishant Dhungel, MD   120 mg at 10/16/15 1006  . feeding supplement (ENSURE ENLIVE) (ENSURE ENLIVE) liquid 237 mL  237 mL Oral BID BM Nishant Dhungel, MD   237 mL at 10/16/15 1008  . fentaNYL (SUBLIMAZE) injection 50 mcg  50 mcg Intravenous Q30 min PRN Leonard Schwartz, MD   50 mcg at 10/13/15 1447  . hydrochlorothiazide (MICROZIDE) capsule 12.5 mg  12.5 mg Oral Daily Nishant Dhungel, MD   12.5 mg at 10/16/15 1003  . HYDROcodone-acetaminophen (NORCO/VICODIN) 5-325 MG per tablet 1-2 tablet  1-2 tablet Oral Q6H PRN Louellen Molder, MD   2 tablet at 10/13/15 2155  . Melatonin TABS 3 mg  3 mg Oral  QHS Nishant Dhungel, MD   3 mg at 10/15/15 2121  . menthol-cetylpyridinium (CEPACOL) lozenge 3 mg  1 lozenge Oral PRN Loni Dolly, PA-C       Or  . phenol (CHLORASEPTIC) mouth spray 1 spray  1 spray Mouth/Throat PRN Loni Dolly, PA-C      . methocarbamol (ROBAXIN) tablet 500 mg  500 mg Oral Q6H PRN Loni Dolly, PA-C       Or  . methocarbamol (ROBAXIN) 500 mg in dextrose 5 % 50 mL IVPB  500 mg Intravenous Q6H PRN Loni Dolly, PA-C      . metoCLOPramide (REGLAN) tablet 5-10 mg  5-10 mg Oral Q8H PRN Loni Dolly, PA-C       Or  . metoCLOPramide (REGLAN) injection 5-10 mg  5-10 mg Intravenous Q8H PRN Loni Dolly, PA-C      . metoprolol tartrate (LOPRESSOR) tablet 25 mg  25 mg Oral BID Nishant Dhungel, MD   25 mg at 10/16/15 1007  . morphine 2 MG/ML injection 0.5 mg  0.5 mg Intravenous Q2H PRN Nishant Dhungel, MD   0.5 mg at 10/14/15 1232  . multivitamin with minerals tablet 1 tablet  1 tablet Oral Daily Nishant Dhungel, MD   1 tablet at 10/16/15 1003  . ondansetron (ZOFRAN) tablet 4 mg  4 mg Oral Q6H PRN Loni Dolly, PA-C       Or  . ondansetron [facility]) injection 4 mg  4 mg Intravenous Q6H PRN Loni Dolly, PA-C      . pravastatin (PRAVACHOL) tablet 10 mg  10 mg Oral q1800 Nishant Dhungel, MD   10 mg at 10/15/15 2121  . senna (SENOKOT) tablet 8.6 mg  1 tablet Oral BID Nishant Dhungel, MD   8.6 mg at 10/16/15 1004  . tranexamic acid (CYKLOKAPRON) 2,000 mg in sodium chloride 0.9 % 50 mL Topical Application  123XX123 mg Topical Once [name], MD         Discharge Medications: Please see discharge summary for a list of discharge medications.  Relevant Imaging Results:  Relevant Lab Results:   Additional Information SSN: [id number]  Caroline Sauger, [place]

## 2015-10-16 NOTE — Progress Notes (Signed)
Physical Therapy Treatment Patient Details Name: Justin [name] MRN: [id number] DOB: 05-11-1929 Today's Date: 10/16/2015    History of Present Illness 82 yo male with onset of R hip fracture with hemiarthroplasty w/ immobilizer in place. Pt sustained a fall taking a balance class.  PMHx:  PAF, head trauma, CAD, CABG,      PT Comments    Pt performed increased gait training but remains limited due to strength, balance and dizziness.  Pt to transfer to rehab to continue aggressive therapy and reduce risk of falls before return home.    Follow Up Recommendations  SNF     Equipment Recommendations  Rolling walker with 5" wheels    Recommendations for Other Services       Precautions / Restrictions Precautions Precautions: Fall;Posterior Hip Precaution Comments: Reviewed precautions with pt; unable to recall any at start of session Required Braces or Orthoses: Knee Immobilizer - Right Knee Immobilizer - Right: Other (comment) (Order read for Knee immobilizer to be donned in bed only.  Removed Knee immobilizer for PT intervention.  ) Restrictions Weight Bearing Restrictions: Yes LLE Weight Bearing: Weight bearing as tolerated    Mobility  Bed Mobility               General bed mobility comments: Pt recieved in recliner chair.    Transfers Overall transfer level: Needs assistance Equipment used: Rolling walker (2 wheeled) Transfers: Sit to/from Stand Sit to Stand: Mod assist         General transfer comment: VCs for hand placement, advancement of LLE forward and anterior translation into standing.  Pt required standing rest break before initiaition of gait training.  Pt reports mild dizziness.  Pt performed transfer from recliner and chair without arms.    Ambulation/Gait Ambulation/Gait assistance: Mod assist Ambulation Distance (Feet): 16 Feet (x2 trials.  ) Assistive device: Rolling walker (2 wheeled) Gait Pattern/deviations: Step-to pattern;Antalgic;Decreased  stride length;Decreased stance time - left;Trunk flexed Gait velocity: reduced   General Gait Details: Pt required cues for RW sequencing and sequencing of B LEs.  Pt required cues for safety, upright posture, scapular retraction and forward gaze.  Pt required seated rest break at 16 ft secondary to dizziness.     Stairs            Wheelchair Mobility    Modified Rankin (Stroke Patients Only)       Balance Overall balance assessment: Modified Independent Sitting-balance support: Feet supported;Bilateral upper extremity supported Sitting balance-Leahy Scale: Fair       Standing balance-Leahy Scale: Poor Standing balance comment: Pt relies heavily on RW for support.                      Cognition Arousal/Alertness: Awake/alert Behavior During Therapy: WFL for tasks assessed/performed Overall Cognitive Status: History of cognitive impairments - at baseline       Memory: Decreased short-term memory              Exercises Total Joint Exercises Ankle Circles/Pumps: AROM;Both;10 reps;Supine (reclined supine for all therapeutic exercise listed below.  ) Quad Sets: AROM;Left;10 reps;Supine Heel Slides: AROM;Left;10 reps;Supine Hip ABduction/ADduction: AROM;Left;10 reps;Supine Long Arc Quad: AROM;Left;10 reps;Seated    General Comments        Pertinent Vitals/Pain Pain Assessment: 0-10 Pain Score: 6  Pain Location: L hip with movement Pain Descriptors / Indicators: Grimacing;Guarding Pain Intervention(s): Limited activity within patient's tolerance;Monitored during session;Repositioned;Ice applied    Home Living  Prior Function            PT Goals (current goals can now be found in the care plan section) Acute Rehab PT Goals Patient Stated Goal: rehab today Potential to Achieve Goals: Fair Progress towards PT goals: Progressing toward goals    Frequency  7X/week    PT Plan Current plan remains appropriate     Co-evaluation             End of Session Equipment Utilized During Treatment: Gait belt Activity Tolerance: Patient limited by fatigue;Patient limited by pain Patient left: with call bell/phone within reach;in bed;with bed alarm set     Time: [id number] PT Time Calculation (min) (ACUTE ONLY): 31 min  Charges:  $Gait Training: 8-22 mins $Therapeutic Exercise: 8-22 mins                    G Codes:      Cristela Blue 2015-11-11, 3:07 PM  Governor Rooks, PTA pager [phone]

## 2015-10-16 NOTE — Progress Notes (Signed)
Subjective: 2 Days Post-Op Procedure(s) (LRB): LEFT HIP HEMI ARTHROPLASTY (Left)  Activity level:  wbat with posterior hip precautions Diet tolerance:  ok Voiding:  ok Patient reports pain as mild.    Objective: Vital signs in last 24 hours: Temp:  [97.8 F (36.6 C)-99 F (37.2 C)] 99 F (37.2 C) (04/20 0434) Pulse Rate:  [72-88] 75 (04/20 1006) Resp:  [16] 16 (04/20 0434) BP: (115-142)/(64-69) 142/69 mmHg (04/20 1006) SpO2:  [96 %-98 %] 96 % (04/20 0434)  Labs:  Recent Labs  10/13/15 1315 10/14/15 0844 10/15/15 0438 10/16/15 0418  HGB 13.2 12.3* 11.5* 10.6*    Recent Labs  10/15/15 0438 10/16/15 0418  WBC 10.0 10.0  RBC 4.14* 3.85*  HCT 34.6* 31.9*  PLT 320 303    Recent Labs  10/15/15 0438 10/16/15 0418  NA 132* 132*  K 4.0 3.8  CL 99* 95*  CO2 25 27  BUN 14 14  CREATININE 1.08 0.89  GLUCOSE 134* 116*  CALCIUM 8.5* 8.5*    Recent Labs  10/13/15 1315  INR 1.35    Physical Exam:  Neurologically intact ABD soft Neurovascular intact Sensation intact distally Intact pulses distally Dorsiflexion/Plantar flexion intact Incision: dressing C/D/I and no drainage No cellulitis present Compartment soft  Assessment/Plan:  2 Days Post-Op Procedure(s) (LRB): LEFT HIP HEMI ARTHROPLASTY (Left) Advance diet Discharge to SNF [facility] today. Continue on home eliquis for DVT prevention. Tylenol only for pain. Continue Posterior hip precautions. Follow up in office 2 weeks post op.  Laporsha Grealish, Larwance Sachs 10/16/2015, 10:32 AM

## 2015-10-16 NOTE — Discharge Summary (Signed)
Physician Discharge Summary  Justin [name] [id number] DOB: 1928/12/16 DOA: 10/13/2015  PCP: [name], MD  Admit date: 10/13/2015 Discharge date: 10/16/2015  Time spent: 30 minutes  Recommendations for Outpatient Follow-up:  Discharge to SNF with outpt follow up with orthopedics ( Dr Latanya Maudlin ) in 2 week  Discharge Diagnoses:  Principal Problem:   Closed left hip fracture ([place])   Active Problems:   Hyperlipidemia   Essential hypertension   CAD, ARTERY BYPASS GRAFT   Aortic stenosis   PAF (paroxysmal atrial fibrillation) ([place])   Discharge Condition: fair  Diet recommendation: hart healthy  Code status: full code  Filed Weights   10/13/15 1723  Weight: 61.689 kg (136 lb)    History of present illness:  Please refer to admission H&P for details, in brief, 82 year old male with history of coronary artery disease history of CABG, hypertension, BPH, moderate aortic stenosis who presented to the ED after sustaining a fall. Patient was in a group exercise program at the gym when he lost his balance and fell on the ground while trying to pick the exercise ball. Patient sustained subcapital fracture of the proximal left femur and was admitted for surgery.  Hospital Course:  Principal Problem:  Closed left hip fracture [facility]) Status post left hemiarthroplasty on 4/18. Stable postop. Up with therapy. Pain control with when necessary Vicodin. On chronic Eliquis for Afib which will cover DVT prophylaxis. PT recommends skilled nursing facility.  Weightbearing as tolerated.  Active Problems: Paroxysmal A. fib Rate controlled. Continue Cardizem. Received perioperative beta blocker. Continue Eliquis.  Essential hypertension Blood pressure stable. Continue home medications    CAD with history of CABG/moderate aortic stenosis Stable. Continue statin. Follow-up with Dr. Aundra Dubin as outpatient.     Code Status : Full code  Family Communication : Wife at  bedside  Disposition Plan :discharge to skilled nursing facility     Consults : Orthopedics (Dr. Rhona Raider)  Procedures :  Left hip hemiarthroplasty     Recent Labs          Discharge Exam: Filed Vitals:   10/15/15 2131 10/16/15 0434  BP: 136/64 142/69  Pulse: 72 75  Temp: 97.8 F (36.6 C) 99 F (37.2 C)  Resp: 16 16   Gen: elderly male not in distress HEENT: no pallor, moist mucosa, supple neck Chest: clear b/l, no added sounds CVS: N [id number], 3/6 systolic murmur, rubs or gallop GI: soft, NT, ND, BS+ Musculoskeletal: warm, dressing over left hip, no edema CNS: Alert and oriented,   Discharge Instructions   Discharge Instructions    Weight bearing as tolerated    Complete by:  As directed   With posterior hip precautions.          Current Discharge Medication List    START taking these medications   Details  feeding supplement, ENSURE ENLIVE, (ENSURE ENLIVE) LIQD Take 237 mLs by mouth 2 (two) times daily between meals. Qty: 237 mL, Refills: 12    HYDROcodone-acetaminophen (NORCO/VICODIN) 5-325 MG tablet Take 1-2 tablets by mouth every 6 (six) hours as needed for moderate pain. Qty: 30 tablet, Refills: 0    senna (SENOKOT) 8.6 MG TABS tablet Take 1 tablet (8.6 mg total) by mouth 2 (two) times daily. Qty: 120 each, Refills: 0      CONTINUE these medications which have CHANGED   Details  acetaminophen (TYLENOL) 325 MG tablet Take 1-2 tablets (325-650 mg total) by mouth every 6 (six) hours as needed for mild pain or moderate pain. Qty:  40 tablet, Refills: 0    apixaban (ELIQUIS) 5 MG TABS tablet Take 1 tablet (5 mg total) by mouth 2 (two) times daily. Qty: 60 tablet, Refills: 0      CONTINUE these medications which have NOT CHANGED   Details  CARTIA XT 120 MG 24 hr capsule TAKE ONE CAPSULE BY MOUTH ONCE DAILY Qty: 30 capsule, Refills: 11    cholecalciferol (VITAMIN D) 1000 UNITS tablet Take 1,000 Units by mouth daily.      diphenhydramine-acetaminophen (TYLENOL PM) 25-500 MG TABS Take 1 tablet by mouth at bedtime as needed (insomnia).    lovastatin (MEVACOR) 20 MG tablet TAKE ONE TABLET BY MOUTH ONCE DAILY AT BEDTIME **PLEASE CALL [phone] FOR AN APPT** Qty: 30 tablet, Refills: 2    Melatonin 3 MG TABS Take 3 mg by mouth at bedtime.    Multiple Vitamin (MULTIVITAMIN) capsule Take 0.5 capsules by mouth daily.     multivitamin-lutein (OCUVITE-LUTEIN) CAPS Take 1 capsule by mouth daily.     Probiotic Product (PROBIOTIC DAILY PO) Take 1 capsule by mouth daily.    hydrochlorothiazide (MICROZIDE) 12.5 MG capsule Take 1 capsule (12.5 mg total) by mouth daily. Qty: 90 capsule, Refills: 3      STOP taking these medications     doxycycline (VIBRAMYCIN) 100 MG capsule        Allergies  Allergen Reactions  . Toradol [Ketorolac Tromethamine] Other (See Comments)    CNS-AMS   Follow-up Information    Follow up with DALLDORF,PETER G, MD. Schedule an appointment as soon as possible for a visit in 2 weeks.   Specialty:  Orthopedic Surgery   Contact information:   [place] [address] [phone]       Please follow up.   Why:  MD at SNF in 1 week       The results of significant diagnostics from this hospitalization (including imaging, microbiology, ancillary and laboratory) are listed below for reference.    Significant Diagnostic Studies: Dg Chest 1 View  10/13/2015  CLINICAL DATA:  Left hip fracture.  Pre operative respiratory exam. EXAM: CHEST 1 VIEW COMPARISON:  09/25/2015 FINDINGS: The heart size and pulmonary vascularity are normal. There arm multiple calcified lymph nodes in the mediastinum and hilar regions as well as at the lung bases. Slight linear scarring at the left base laterally. The lungs are otherwise clear. Fairly severe degenerative changes of both shoulders. CABG. IMPRESSION: No acute abnormalities. Electronically Signed   By: Lorriane Shire M.D.   On: 10/13/2015  13:45   Dg Chest 2 View  09/25/2015  CLINICAL DATA:  Back pain and cough for 3 days EXAM: CHEST  2 VIEW COMPARISON:  09/22/2015 FINDINGS: Cardiac shadow is stable. Changes of prior granulomatous disease are again noted. No focal infiltrate or sizable effusion is noted. Postsurgical changes are seen. Mild peribronchial cuffing is again identified likely of a chronic nature. A tiny right-sided pleural effusion is now seen. IMPRESSION: Tiny right-sided pleural effusion. No other focal abnormality is seen. Prior granulomatous disease. Electronically Signed   By: Inez Catalina M.D.   On: 09/25/2015 17:42   Dg Chest 2 View  09/22/2015  CLINICAL DATA:  Acute onset of cough and sore throat. Generalized weakness and shortness of breath. Initial encounter. EXAM: CHEST  2 VIEW COMPARISON:  Chest radiograph from 01/05/2005 FINDINGS: The lungs are well-aerated. Peribronchial thickening is noted. There is no evidence of focal opacification, pleural effusion or pneumothorax. Calcified bilateral hilar and mediastinal nodes are again  seen. The heart is normal in size; the patient is status post median sternotomy, with evidence of prior CABG. No acute osseous abnormalities are seen. IMPRESSION: 1. No acute cardiopulmonary process seen. 2. Calcified bilateral hilar and mediastinal nodes again seen, reflecting remote granulomatous disease. Peribronchial thickening noted. Electronically Signed   By: Garald Balding M.D.   On: 09/22/2015 03:36   Dg Hip Unilat With Pelvis 2-3 Views Left  10/13/2015  CLINICAL DATA:  Left hip pain after a fall while exercising. EXAM: DG HIP (WITH OR WITHOUT PELVIS) 2-3V LEFT COMPARISON:  None. FINDINGS: There is a subcapital fracture of the proximal right femur with slight proximal retraction of the distal fragment. The pelvic bones are intact. Extensive vascular calcification. IMPRESSION: Slightly retracted subcapital fracture of the proximal left femur. Electronically Signed   By: Lorriane Shire  M.D.   On: 10/13/2015 13:44    Microbiology: Recent Results (from the past 240 hour(s))  Surgical pcr screen     Status: None   Collection Time: 10/13/15 10:14 PM  Result Value Ref Range Status   MRSA, PCR NEGATIVE NEGATIVE Final   Staphylococcus aureus NEGATIVE NEGATIVE Final    Comment:        The Xpert SA Assay (FDA approved for NASAL specimens in patients over 23 years of age), is one component of a comprehensive surveillance program.  Test performance has been validated by [facility] for patients greater than or equal to 50 year old. It is not intended to diagnose infection nor to guide or monitor treatment.      Labs: Basic Metabolic Panel:  Recent Labs Lab 10/13/15 1315 10/14/15 0844 10/15/15 0438 10/16/15 0418  NA 133* 136 132* 132*  K 4.5 4.0 4.0 3.8  CL 100* 101 99* 95*  CO2 26 26 25 27   GLUCOSE 107* 101* 134* 116*  BUN 16 10 14 14   CREATININE 0.97 0.92 1.08 0.89  CALCIUM 8.9 9.0 8.5* 8.5*   Liver Function Tests:  Recent Labs Lab 10/15/15 0438  AST 25  ALT 13*  ALKPHOS 52  BILITOT 0.9  PROT 5.5*  ALBUMIN 2.4*   No results for input(s): LIPASE, AMYLASE in the last 168 hours. No results for input(s): AMMONIA in the last 168 hours. CBC:  Recent Labs Lab 10/13/15 1315 10/14/15 0844 10/15/15 0438 10/16/15 0418  WBC 7.2 8.6 10.0 10.0  NEUTROABS 5.3  --  7.7  --   HGB 13.2 12.3* 11.5* 10.6*  HCT 39.1 36.5* 34.6* 31.9*  MCV 84.1 84.3 83.6 82.9  PLT 421* 367 320 303   Cardiac Enzymes: No results for input(s): CKTOTAL, CKMB, CKMBINDEX, TROPONINI in the last 168 hours. BNP: BNP (last 3 results) No results for input(s): BNP in the last 8760 hours.  ProBNP (last 3 results) No results for input(s): PROBNP in the last 8760 hours.  CBG: No results for input(s): GLUCAP in the last 168 hours.     Signed:  Louellen Molder MD.  [facility] 10/16/2015, 9:33 AM

## 2015-10-16 NOTE — Discharge Instructions (Signed)
Weightbearing as tolerated!

## 2015-10-17 DIAGNOSIS — E559 Vitamin D deficiency, unspecified: Secondary | ICD-10-CM | POA: Diagnosis not present

## 2015-10-17 DIAGNOSIS — E639 Nutritional deficiency, unspecified: Secondary | ICD-10-CM | POA: Diagnosis not present

## 2015-10-17 DIAGNOSIS — Z471 Aftercare following joint replacement surgery: Secondary | ICD-10-CM | POA: Diagnosis not present

## 2015-10-17 DIAGNOSIS — E785 Hyperlipidemia, unspecified: Secondary | ICD-10-CM | POA: Diagnosis not present

## 2015-10-17 DIAGNOSIS — I480 Paroxysmal atrial fibrillation: Secondary | ICD-10-CM | POA: Diagnosis not present

## 2015-10-17 DIAGNOSIS — Z951 Presence of aortocoronary bypass graft: Secondary | ICD-10-CM | POA: Diagnosis not present

## 2015-10-21 DIAGNOSIS — R2689 Other abnormalities of gait and mobility: Secondary | ICD-10-CM | POA: Diagnosis not present

## 2015-10-21 DIAGNOSIS — M25552 Pain in left hip: Secondary | ICD-10-CM | POA: Diagnosis not present

## 2015-10-22 DIAGNOSIS — S72092A Other fracture of head and neck of left femur, initial encounter for closed fracture: Secondary | ICD-10-CM | POA: Diagnosis not present

## 2015-10-22 DIAGNOSIS — Z471 Aftercare following joint replacement surgery: Secondary | ICD-10-CM | POA: Diagnosis not present

## 2015-10-22 DIAGNOSIS — I480 Paroxysmal atrial fibrillation: Secondary | ICD-10-CM | POA: Diagnosis not present

## 2015-10-27 DIAGNOSIS — Z471 Aftercare following joint replacement surgery: Secondary | ICD-10-CM | POA: Diagnosis not present

## 2015-10-27 DIAGNOSIS — M6281 Muscle weakness (generalized): Secondary | ICD-10-CM | POA: Diagnosis not present

## 2015-10-27 DIAGNOSIS — Z96642 Presence of left artificial hip joint: Secondary | ICD-10-CM | POA: Diagnosis not present

## 2015-10-27 DIAGNOSIS — R262 Difficulty in walking, not elsewhere classified: Secondary | ICD-10-CM | POA: Diagnosis not present

## 2015-10-27 DIAGNOSIS — R2681 Unsteadiness on feet: Secondary | ICD-10-CM | POA: Diagnosis not present

## 2015-10-28 DIAGNOSIS — Z96652 Presence of left artificial knee joint: Secondary | ICD-10-CM | POA: Diagnosis not present

## 2015-10-29 ENCOUNTER — Ambulatory Visit: Payer: PPO | Admitting: Cardiology

## 2015-10-29 DIAGNOSIS — Z471 Aftercare following joint replacement surgery: Secondary | ICD-10-CM | POA: Diagnosis not present

## 2015-10-29 DIAGNOSIS — Z96649 Presence of unspecified artificial hip joint: Secondary | ICD-10-CM | POA: Diagnosis not present

## 2015-11-03 DIAGNOSIS — M6281 Muscle weakness (generalized): Secondary | ICD-10-CM | POA: Diagnosis not present

## 2015-11-03 DIAGNOSIS — Z9181 History of falling: Secondary | ICD-10-CM | POA: Diagnosis not present

## 2015-11-03 DIAGNOSIS — Z471 Aftercare following joint replacement surgery: Secondary | ICD-10-CM | POA: Diagnosis not present

## 2015-11-03 DIAGNOSIS — R262 Difficulty in walking, not elsewhere classified: Secondary | ICD-10-CM | POA: Diagnosis not present

## 2015-11-03 DIAGNOSIS — Z96642 Presence of left artificial hip joint: Secondary | ICD-10-CM | POA: Diagnosis not present

## 2015-11-04 DIAGNOSIS — Z9181 History of falling: Secondary | ICD-10-CM | POA: Diagnosis not present

## 2015-11-04 DIAGNOSIS — R262 Difficulty in walking, not elsewhere classified: Secondary | ICD-10-CM | POA: Diagnosis not present

## 2015-11-04 DIAGNOSIS — M6281 Muscle weakness (generalized): Secondary | ICD-10-CM | POA: Diagnosis not present

## 2015-11-04 DIAGNOSIS — Z471 Aftercare following joint replacement surgery: Secondary | ICD-10-CM | POA: Diagnosis not present

## 2015-11-04 DIAGNOSIS — Z96642 Presence of left artificial hip joint: Secondary | ICD-10-CM | POA: Diagnosis not present

## 2015-11-07 DIAGNOSIS — Z471 Aftercare following joint replacement surgery: Secondary | ICD-10-CM | POA: Diagnosis not present

## 2015-11-07 DIAGNOSIS — R262 Difficulty in walking, not elsewhere classified: Secondary | ICD-10-CM | POA: Diagnosis not present

## 2015-11-07 DIAGNOSIS — Z96642 Presence of left artificial hip joint: Secondary | ICD-10-CM | POA: Diagnosis not present

## 2015-11-07 DIAGNOSIS — M6281 Muscle weakness (generalized): Secondary | ICD-10-CM | POA: Diagnosis not present

## 2015-11-07 DIAGNOSIS — Z9181 History of falling: Secondary | ICD-10-CM | POA: Diagnosis not present

## 2015-11-10 DIAGNOSIS — Z471 Aftercare following joint replacement surgery: Secondary | ICD-10-CM | POA: Diagnosis not present

## 2015-11-10 DIAGNOSIS — Z96642 Presence of left artificial hip joint: Secondary | ICD-10-CM | POA: Diagnosis not present

## 2015-11-10 DIAGNOSIS — Z9181 History of falling: Secondary | ICD-10-CM | POA: Diagnosis not present

## 2015-11-10 DIAGNOSIS — M6281 Muscle weakness (generalized): Secondary | ICD-10-CM | POA: Diagnosis not present

## 2015-11-10 DIAGNOSIS — R262 Difficulty in walking, not elsewhere classified: Secondary | ICD-10-CM | POA: Diagnosis not present

## 2015-11-11 DIAGNOSIS — Z9181 History of falling: Secondary | ICD-10-CM | POA: Diagnosis not present

## 2015-11-11 DIAGNOSIS — Z471 Aftercare following joint replacement surgery: Secondary | ICD-10-CM | POA: Diagnosis not present

## 2015-11-11 DIAGNOSIS — Z96642 Presence of left artificial hip joint: Secondary | ICD-10-CM | POA: Diagnosis not present

## 2015-11-11 DIAGNOSIS — R262 Difficulty in walking, not elsewhere classified: Secondary | ICD-10-CM | POA: Diagnosis not present

## 2015-11-11 DIAGNOSIS — M6281 Muscle weakness (generalized): Secondary | ICD-10-CM | POA: Diagnosis not present

## 2015-11-12 DIAGNOSIS — Z96642 Presence of left artificial hip joint: Secondary | ICD-10-CM | POA: Diagnosis not present

## 2015-11-13 DIAGNOSIS — R262 Difficulty in walking, not elsewhere classified: Secondary | ICD-10-CM | POA: Diagnosis not present

## 2015-11-13 DIAGNOSIS — Z96642 Presence of left artificial hip joint: Secondary | ICD-10-CM | POA: Diagnosis not present

## 2015-11-13 DIAGNOSIS — Z9181 History of falling: Secondary | ICD-10-CM | POA: Diagnosis not present

## 2015-11-13 DIAGNOSIS — M6281 Muscle weakness (generalized): Secondary | ICD-10-CM | POA: Diagnosis not present

## 2015-11-13 DIAGNOSIS — Z471 Aftercare following joint replacement surgery: Secondary | ICD-10-CM | POA: Diagnosis not present

## 2015-11-17 ENCOUNTER — Other Ambulatory Visit: Payer: Self-pay | Admitting: Cardiology

## 2015-11-17 DIAGNOSIS — Z96642 Presence of left artificial hip joint: Secondary | ICD-10-CM | POA: Diagnosis not present

## 2015-11-17 DIAGNOSIS — M6281 Muscle weakness (generalized): Secondary | ICD-10-CM | POA: Diagnosis not present

## 2015-11-17 DIAGNOSIS — Z471 Aftercare following joint replacement surgery: Secondary | ICD-10-CM | POA: Diagnosis not present

## 2015-11-17 DIAGNOSIS — R262 Difficulty in walking, not elsewhere classified: Secondary | ICD-10-CM | POA: Diagnosis not present

## 2015-11-17 DIAGNOSIS — Z9181 History of falling: Secondary | ICD-10-CM | POA: Diagnosis not present

## 2015-11-18 DIAGNOSIS — R262 Difficulty in walking, not elsewhere classified: Secondary | ICD-10-CM | POA: Diagnosis not present

## 2015-11-18 DIAGNOSIS — Z471 Aftercare following joint replacement surgery: Secondary | ICD-10-CM | POA: Diagnosis not present

## 2015-11-18 DIAGNOSIS — Z96642 Presence of left artificial hip joint: Secondary | ICD-10-CM | POA: Diagnosis not present

## 2015-11-18 DIAGNOSIS — Z9181 History of falling: Secondary | ICD-10-CM | POA: Diagnosis not present

## 2015-11-18 DIAGNOSIS — M6281 Muscle weakness (generalized): Secondary | ICD-10-CM | POA: Diagnosis not present

## 2015-11-20 DIAGNOSIS — Z9181 History of falling: Secondary | ICD-10-CM | POA: Diagnosis not present

## 2015-11-20 DIAGNOSIS — Z96642 Presence of left artificial hip joint: Secondary | ICD-10-CM | POA: Diagnosis not present

## 2015-11-20 DIAGNOSIS — R262 Difficulty in walking, not elsewhere classified: Secondary | ICD-10-CM | POA: Diagnosis not present

## 2015-11-20 DIAGNOSIS — Z471 Aftercare following joint replacement surgery: Secondary | ICD-10-CM | POA: Diagnosis not present

## 2015-11-20 DIAGNOSIS — M6281 Muscle weakness (generalized): Secondary | ICD-10-CM | POA: Diagnosis not present

## 2015-11-24 DIAGNOSIS — Z471 Aftercare following joint replacement surgery: Secondary | ICD-10-CM | POA: Diagnosis not present

## 2015-11-24 DIAGNOSIS — M6281 Muscle weakness (generalized): Secondary | ICD-10-CM | POA: Diagnosis not present

## 2015-11-24 DIAGNOSIS — Z9181 History of falling: Secondary | ICD-10-CM | POA: Diagnosis not present

## 2015-11-24 DIAGNOSIS — R262 Difficulty in walking, not elsewhere classified: Secondary | ICD-10-CM | POA: Diagnosis not present

## 2015-11-24 DIAGNOSIS — Z96642 Presence of left artificial hip joint: Secondary | ICD-10-CM | POA: Diagnosis not present

## 2015-11-25 DIAGNOSIS — Z9181 History of falling: Secondary | ICD-10-CM | POA: Diagnosis not present

## 2015-11-25 DIAGNOSIS — Z96642 Presence of left artificial hip joint: Secondary | ICD-10-CM | POA: Diagnosis not present

## 2015-11-25 DIAGNOSIS — M6281 Muscle weakness (generalized): Secondary | ICD-10-CM | POA: Diagnosis not present

## 2015-11-25 DIAGNOSIS — R262 Difficulty in walking, not elsewhere classified: Secondary | ICD-10-CM | POA: Diagnosis not present

## 2015-11-25 DIAGNOSIS — Z471 Aftercare following joint replacement surgery: Secondary | ICD-10-CM | POA: Diagnosis not present

## 2015-11-27 DIAGNOSIS — Z9181 History of falling: Secondary | ICD-10-CM | POA: Diagnosis not present

## 2015-11-27 DIAGNOSIS — M6281 Muscle weakness (generalized): Secondary | ICD-10-CM | POA: Diagnosis not present

## 2015-11-27 DIAGNOSIS — R262 Difficulty in walking, not elsewhere classified: Secondary | ICD-10-CM | POA: Diagnosis not present

## 2015-11-27 DIAGNOSIS — Z471 Aftercare following joint replacement surgery: Secondary | ICD-10-CM | POA: Diagnosis not present

## 2015-11-27 DIAGNOSIS — Z96642 Presence of left artificial hip joint: Secondary | ICD-10-CM | POA: Diagnosis not present

## 2015-12-01 DIAGNOSIS — M6281 Muscle weakness (generalized): Secondary | ICD-10-CM | POA: Diagnosis not present

## 2015-12-01 DIAGNOSIS — R262 Difficulty in walking, not elsewhere classified: Secondary | ICD-10-CM | POA: Diagnosis not present

## 2015-12-01 DIAGNOSIS — Z96642 Presence of left artificial hip joint: Secondary | ICD-10-CM | POA: Diagnosis not present

## 2015-12-01 DIAGNOSIS — Z9181 History of falling: Secondary | ICD-10-CM | POA: Diagnosis not present

## 2015-12-01 DIAGNOSIS — Z471 Aftercare following joint replacement surgery: Secondary | ICD-10-CM | POA: Diagnosis not present

## 2015-12-08 DIAGNOSIS — Z471 Aftercare following joint replacement surgery: Secondary | ICD-10-CM | POA: Diagnosis not present

## 2015-12-08 DIAGNOSIS — Z9181 History of falling: Secondary | ICD-10-CM | POA: Diagnosis not present

## 2015-12-08 DIAGNOSIS — M6281 Muscle weakness (generalized): Secondary | ICD-10-CM | POA: Diagnosis not present

## 2015-12-08 DIAGNOSIS — R262 Difficulty in walking, not elsewhere classified: Secondary | ICD-10-CM | POA: Diagnosis not present

## 2015-12-08 DIAGNOSIS — Z96642 Presence of left artificial hip joint: Secondary | ICD-10-CM | POA: Diagnosis not present

## 2015-12-09 DIAGNOSIS — Z9181 History of falling: Secondary | ICD-10-CM | POA: Diagnosis not present

## 2015-12-09 DIAGNOSIS — Z96642 Presence of left artificial hip joint: Secondary | ICD-10-CM | POA: Diagnosis not present

## 2015-12-09 DIAGNOSIS — Z471 Aftercare following joint replacement surgery: Secondary | ICD-10-CM | POA: Diagnosis not present

## 2015-12-09 DIAGNOSIS — M6281 Muscle weakness (generalized): Secondary | ICD-10-CM | POA: Diagnosis not present

## 2015-12-09 DIAGNOSIS — R262 Difficulty in walking, not elsewhere classified: Secondary | ICD-10-CM | POA: Diagnosis not present

## 2015-12-10 DIAGNOSIS — Z85828 Personal history of other malignant neoplasm of skin: Secondary | ICD-10-CM | POA: Diagnosis not present

## 2015-12-10 DIAGNOSIS — Z08 Encounter for follow-up examination after completed treatment for malignant neoplasm: Secondary | ICD-10-CM | POA: Diagnosis not present

## 2015-12-10 DIAGNOSIS — L570 Actinic keratosis: Secondary | ICD-10-CM | POA: Diagnosis not present

## 2015-12-11 DIAGNOSIS — M6281 Muscle weakness (generalized): Secondary | ICD-10-CM | POA: Diagnosis not present

## 2015-12-11 DIAGNOSIS — Z96642 Presence of left artificial hip joint: Secondary | ICD-10-CM | POA: Diagnosis not present

## 2015-12-11 DIAGNOSIS — R262 Difficulty in walking, not elsewhere classified: Secondary | ICD-10-CM | POA: Diagnosis not present

## 2015-12-11 DIAGNOSIS — Z9181 History of falling: Secondary | ICD-10-CM | POA: Diagnosis not present

## 2015-12-11 DIAGNOSIS — Z471 Aftercare following joint replacement surgery: Secondary | ICD-10-CM | POA: Diagnosis not present

## 2015-12-15 DIAGNOSIS — R262 Difficulty in walking, not elsewhere classified: Secondary | ICD-10-CM | POA: Diagnosis not present

## 2015-12-15 DIAGNOSIS — Z9181 History of falling: Secondary | ICD-10-CM | POA: Diagnosis not present

## 2015-12-15 DIAGNOSIS — Z471 Aftercare following joint replacement surgery: Secondary | ICD-10-CM | POA: Diagnosis not present

## 2015-12-15 DIAGNOSIS — S72092D Other fracture of head and neck of left femur, subsequent encounter for closed fracture with routine healing: Secondary | ICD-10-CM | POA: Diagnosis not present

## 2015-12-15 DIAGNOSIS — M6281 Muscle weakness (generalized): Secondary | ICD-10-CM | POA: Diagnosis not present

## 2015-12-15 DIAGNOSIS — Z96642 Presence of left artificial hip joint: Secondary | ICD-10-CM | POA: Diagnosis not present

## 2015-12-16 ENCOUNTER — Other Ambulatory Visit: Payer: Self-pay | Admitting: Cardiology

## 2015-12-17 DIAGNOSIS — R262 Difficulty in walking, not elsewhere classified: Secondary | ICD-10-CM | POA: Diagnosis not present

## 2015-12-17 DIAGNOSIS — Z9181 History of falling: Secondary | ICD-10-CM | POA: Diagnosis not present

## 2015-12-17 DIAGNOSIS — Z471 Aftercare following joint replacement surgery: Secondary | ICD-10-CM | POA: Diagnosis not present

## 2015-12-17 DIAGNOSIS — M6281 Muscle weakness (generalized): Secondary | ICD-10-CM | POA: Diagnosis not present

## 2015-12-17 DIAGNOSIS — Z96642 Presence of left artificial hip joint: Secondary | ICD-10-CM | POA: Diagnosis not present

## 2015-12-18 DIAGNOSIS — M6281 Muscle weakness (generalized): Secondary | ICD-10-CM | POA: Diagnosis not present

## 2015-12-18 DIAGNOSIS — Z9181 History of falling: Secondary | ICD-10-CM | POA: Diagnosis not present

## 2015-12-18 DIAGNOSIS — Z96642 Presence of left artificial hip joint: Secondary | ICD-10-CM | POA: Diagnosis not present

## 2015-12-18 DIAGNOSIS — R262 Difficulty in walking, not elsewhere classified: Secondary | ICD-10-CM | POA: Diagnosis not present

## 2015-12-18 DIAGNOSIS — Z471 Aftercare following joint replacement surgery: Secondary | ICD-10-CM | POA: Diagnosis not present

## 2015-12-22 DIAGNOSIS — Z96642 Presence of left artificial hip joint: Secondary | ICD-10-CM | POA: Diagnosis not present

## 2015-12-22 DIAGNOSIS — R262 Difficulty in walking, not elsewhere classified: Secondary | ICD-10-CM | POA: Diagnosis not present

## 2015-12-22 DIAGNOSIS — Z9181 History of falling: Secondary | ICD-10-CM | POA: Diagnosis not present

## 2015-12-22 DIAGNOSIS — Z471 Aftercare following joint replacement surgery: Secondary | ICD-10-CM | POA: Diagnosis not present

## 2015-12-22 DIAGNOSIS — M6281 Muscle weakness (generalized): Secondary | ICD-10-CM | POA: Diagnosis not present

## 2015-12-24 DIAGNOSIS — M6281 Muscle weakness (generalized): Secondary | ICD-10-CM | POA: Diagnosis not present

## 2015-12-24 DIAGNOSIS — Z96642 Presence of left artificial hip joint: Secondary | ICD-10-CM | POA: Diagnosis not present

## 2015-12-24 DIAGNOSIS — Z9181 History of falling: Secondary | ICD-10-CM | POA: Diagnosis not present

## 2015-12-24 DIAGNOSIS — R262 Difficulty in walking, not elsewhere classified: Secondary | ICD-10-CM | POA: Diagnosis not present

## 2015-12-24 DIAGNOSIS — Z471 Aftercare following joint replacement surgery: Secondary | ICD-10-CM | POA: Diagnosis not present

## 2015-12-25 DIAGNOSIS — Z471 Aftercare following joint replacement surgery: Secondary | ICD-10-CM | POA: Diagnosis not present

## 2015-12-25 DIAGNOSIS — M6281 Muscle weakness (generalized): Secondary | ICD-10-CM | POA: Diagnosis not present

## 2015-12-25 DIAGNOSIS — Z9181 History of falling: Secondary | ICD-10-CM | POA: Diagnosis not present

## 2015-12-25 DIAGNOSIS — R262 Difficulty in walking, not elsewhere classified: Secondary | ICD-10-CM | POA: Diagnosis not present

## 2015-12-25 DIAGNOSIS — Z96642 Presence of left artificial hip joint: Secondary | ICD-10-CM | POA: Diagnosis not present

## 2015-12-31 DIAGNOSIS — R262 Difficulty in walking, not elsewhere classified: Secondary | ICD-10-CM | POA: Diagnosis not present

## 2015-12-31 DIAGNOSIS — Z471 Aftercare following joint replacement surgery: Secondary | ICD-10-CM | POA: Diagnosis not present

## 2015-12-31 DIAGNOSIS — Z96642 Presence of left artificial hip joint: Secondary | ICD-10-CM | POA: Diagnosis not present

## 2015-12-31 DIAGNOSIS — M6281 Muscle weakness (generalized): Secondary | ICD-10-CM | POA: Diagnosis not present

## 2015-12-31 DIAGNOSIS — Z9181 History of falling: Secondary | ICD-10-CM | POA: Diagnosis not present

## 2016-01-02 DIAGNOSIS — Z471 Aftercare following joint replacement surgery: Secondary | ICD-10-CM | POA: Diagnosis not present

## 2016-01-02 DIAGNOSIS — M6281 Muscle weakness (generalized): Secondary | ICD-10-CM | POA: Diagnosis not present

## 2016-01-02 DIAGNOSIS — R262 Difficulty in walking, not elsewhere classified: Secondary | ICD-10-CM | POA: Diagnosis not present

## 2016-01-02 DIAGNOSIS — Z96642 Presence of left artificial hip joint: Secondary | ICD-10-CM | POA: Diagnosis not present

## 2016-01-02 DIAGNOSIS — Z9181 History of falling: Secondary | ICD-10-CM | POA: Diagnosis not present

## 2016-01-05 DIAGNOSIS — M6281 Muscle weakness (generalized): Secondary | ICD-10-CM | POA: Diagnosis not present

## 2016-01-05 DIAGNOSIS — R262 Difficulty in walking, not elsewhere classified: Secondary | ICD-10-CM | POA: Diagnosis not present

## 2016-01-05 DIAGNOSIS — Z9181 History of falling: Secondary | ICD-10-CM | POA: Diagnosis not present

## 2016-01-05 DIAGNOSIS — Z471 Aftercare following joint replacement surgery: Secondary | ICD-10-CM | POA: Diagnosis not present

## 2016-01-05 DIAGNOSIS — Z96642 Presence of left artificial hip joint: Secondary | ICD-10-CM | POA: Diagnosis not present

## 2016-01-09 DIAGNOSIS — Z9181 History of falling: Secondary | ICD-10-CM | POA: Diagnosis not present

## 2016-01-09 DIAGNOSIS — Z96642 Presence of left artificial hip joint: Secondary | ICD-10-CM | POA: Diagnosis not present

## 2016-01-09 DIAGNOSIS — Z471 Aftercare following joint replacement surgery: Secondary | ICD-10-CM | POA: Diagnosis not present

## 2016-01-09 DIAGNOSIS — M6281 Muscle weakness (generalized): Secondary | ICD-10-CM | POA: Diagnosis not present

## 2016-01-09 DIAGNOSIS — R262 Difficulty in walking, not elsewhere classified: Secondary | ICD-10-CM | POA: Diagnosis not present

## 2016-01-12 DIAGNOSIS — S72092D Other fracture of head and neck of left femur, subsequent encounter for closed fracture with routine healing: Secondary | ICD-10-CM | POA: Diagnosis not present

## 2016-01-12 DIAGNOSIS — R262 Difficulty in walking, not elsewhere classified: Secondary | ICD-10-CM | POA: Diagnosis not present

## 2016-01-12 DIAGNOSIS — Z9181 History of falling: Secondary | ICD-10-CM | POA: Diagnosis not present

## 2016-01-12 DIAGNOSIS — M6281 Muscle weakness (generalized): Secondary | ICD-10-CM | POA: Diagnosis not present

## 2016-01-12 DIAGNOSIS — Z471 Aftercare following joint replacement surgery: Secondary | ICD-10-CM | POA: Diagnosis not present

## 2016-01-12 DIAGNOSIS — Z96642 Presence of left artificial hip joint: Secondary | ICD-10-CM | POA: Diagnosis not present

## 2016-01-16 DIAGNOSIS — Z9181 History of falling: Secondary | ICD-10-CM | POA: Diagnosis not present

## 2016-01-16 DIAGNOSIS — Z96642 Presence of left artificial hip joint: Secondary | ICD-10-CM | POA: Diagnosis not present

## 2016-01-16 DIAGNOSIS — R262 Difficulty in walking, not elsewhere classified: Secondary | ICD-10-CM | POA: Diagnosis not present

## 2016-01-16 DIAGNOSIS — Z471 Aftercare following joint replacement surgery: Secondary | ICD-10-CM | POA: Diagnosis not present

## 2016-01-16 DIAGNOSIS — M6281 Muscle weakness (generalized): Secondary | ICD-10-CM | POA: Diagnosis not present

## 2016-01-19 ENCOUNTER — Ambulatory Visit (INDEPENDENT_AMBULATORY_CARE_PROVIDER_SITE_OTHER): Payer: PPO | Admitting: Cardiology

## 2016-01-19 ENCOUNTER — Other Ambulatory Visit: Payer: Self-pay | Admitting: Cardiology

## 2016-01-19 ENCOUNTER — Encounter: Payer: Self-pay | Admitting: Cardiology

## 2016-01-19 VITALS — BP 142/64 | HR 79 | Ht 65.0 in | Wt 142.0 lb

## 2016-01-19 DIAGNOSIS — I739 Peripheral vascular disease, unspecified: Secondary | ICD-10-CM

## 2016-01-19 DIAGNOSIS — I350 Nonrheumatic aortic (valve) stenosis: Secondary | ICD-10-CM

## 2016-01-19 DIAGNOSIS — R319 Hematuria, unspecified: Secondary | ICD-10-CM | POA: Diagnosis not present

## 2016-01-19 DIAGNOSIS — I480 Paroxysmal atrial fibrillation: Secondary | ICD-10-CM

## 2016-01-19 DIAGNOSIS — E785 Hyperlipidemia, unspecified: Secondary | ICD-10-CM

## 2016-01-19 DIAGNOSIS — I25810 Atherosclerosis of coronary artery bypass graft(s) without angina pectoris: Secondary | ICD-10-CM

## 2016-01-19 NOTE — Progress Notes (Addendum)
Patient ID: Justin [name], male   DOB: June 05, 1929, 82 y.o.   MRN: [id number] PCP: Dr. Charlett Blake  82 yo with history of CAD s/p CABG presents for cardiology followup.  Since the last time I saw him, he had an episode of syncope on 09/13/14.  He had not eaten breakfast that morning and went to serve at [employer].  After finishing, he felt weak and sat down.  He then stood up, got lightheaded, and lost consciousness for a few seconds.  He was taken to the ER and was found to have atrial fibrillation.  He had not felt palpitations.  Syncope was thought to be due to orthostasis in the setting of dehydration and possibly the atrial fibrillation.  However, there was some concern that he could have have tachy-brady syndrome and could have passed out due to bradycardia.  He wore an event monitor in 3/16 that showed predominant NSR with one run of atrial fibrillation.  He is now on apixaban.   In 4/17, he had a mechanical fall (tripped) and fractured left femur requiring surgery.  He has recovered quite well from this injury.  He is back to golfing and walking for exercise, no exertional dyspnea.  Still has some trouble with balance.  No further falls.  No lightheadedness.  No chest pain.  He has had 3 episodes of transient hematuria in the last few months.  Each time, he decreased apixaban to once daily for 2 days and then it resolved. No claudication.   Labs (3/14): LDL 82, HDL 68, K 5.2, creatinine 1.06 Labs (5/14): TSH normal Labs (8/15): LDL 73 Labs (3/16): K 4.6, creatinine 1.23, HCT 37.4 Labs (11/16): LDL 74 Labs (4/17): K 3.8, creatinine 0.89  ECG: NSR, LVH, anterolateral T wave inversions  PMH: 1. HTN 2. Hyperlipidemia 3. BPH 4. H/o sigmoid diverticulitis 5. CAD: s/p CABG x 5 in 2001.   6. Aortic stenosis: Echo (3/14) with EF 55%, AVA 1.5 cm^2 with mean gradient 14 mmHg (mild range). Echo (3/16) with EF 55-60%, mild MR, mild-moderate aortic stenosis with mean gradient 19 mmHg, AVA 1.1 cm^2.  7.  Imbalance: ?polyneuropathy.  8. PAD: Peripheral arterial dopplers and aorto-iliac exam in 12/14 showed < 50% diffuse bilateral SFA disease and calcified plaque in the iliac system but no significant stenosis.  Lower extremity arterial dopplers in 4/16 showed noncompressible right ABI, left ABI 1.1, TBIs stable => no significant change from 12/14.   9. Atrial fibrillation: Paroxysmal, 1st documented episode in 3/16.  Event monitor in 3/16 showed predominant NSR with one run of atrial fibrillation.  10. Syncope: 3/16.  11. Left femur fracture 4/17.   SH: Married, prior smoker, lives in [place], retired  FH: Father with CAD  ROS: All systems reviewed and negative except as per HPI.   Current Outpatient Prescriptions  Medication Sig Dispense Refill  . acetaminophen (TYLENOL) 325 MG tablet Take 1-2 tablets (325-650 mg total) by mouth every 6 (six) hours as needed for mild pain or moderate pain. 40 tablet 0  . apixaban (ELIQUIS) 5 MG TABS tablet Take 1 tablet (5 mg total) by mouth 2 (two) times daily. 60 tablet 0  . CARTIA XT 120 MG 24 hr capsule TAKE ONE CAPSULE BY MOUTH ONCE DAILY 30 capsule 11  . cholecalciferol (VITAMIN D) 1000 UNITS tablet Take 1,000 Units by mouth daily.     . diphenhydramine-acetaminophen (TYLENOL PM) 25-500 MG TABS Take 1 tablet by mouth at bedtime as needed (insomnia).    [name]  feeding supplement, ENSURE ENLIVE, (ENSURE ENLIVE) LIQD Take 237 mLs by mouth 2 (two) times daily between meals. 237 mL 12  . hydrochlorothiazide (MICROZIDE) 12.5 MG capsule Take 1 capsule (12.5 mg total) by mouth daily. 90 capsule 3  . HYDROcodone-acetaminophen (NORCO/VICODIN) 5-325 MG tablet Take 1-2 tablets by mouth every 6 (six) hours as needed for moderate pain. 30 tablet 0  . lovastatin (MEVACOR) 20 MG tablet Take 1 tablet (20 mg total) by mouth at bedtime. 30 tablet 0  . Multiple Vitamin (MULTIVITAMIN) capsule Take 0.5 capsules by mouth daily.     . multivitamin-lutein (OCUVITE-LUTEIN) CAPS Take 1  capsule by mouth daily.     . Probiotic Product (PROBIOTIC DAILY PO) Take 1 capsule by mouth daily.     No current facility-administered medications for this visit.     BP (!) 142/64   Pulse 79   Ht 5\' 5"  (1.651 m)   Wt 142 lb (64.4 kg)   BMI 23.63 kg/m  General: NAD Neck: No JVD, no thyromegaly or thyroid nodule.  Lungs: Clear to auscultation bilaterally with normal respiratory effort. CV: Nondisplaced PMI.  Heart regular S1/S2, no S3/S4, 2/6 SEM RUSB with clear S2.  1+ ankle edema bilaterally.  No carotid bruit.  Feet are warm but I am unable to palpate PT pulses.  Abdomen: Soft, nontender, no hepatosplenomegaly, no distention.  Skin: Intact without lesions or rashes.  Neurologic: Alert and oriented x 3.  Psych: Normal affect. Extremities: No clubbing or cyanosis.   Assessment/Plan: 1. CAD: s/p CABG.  No chest pain.  Continue ASA 81 and statin.    2. Aortic stenosis: Mild to moderate in past.  Will arrange for followup echo.  3. Leg weakness: Patient had PAD by doppler exam in 12/14 but appeared nonobstructive.  He does not have claudication but continues to feel that his legs tire easily.  Peripheral arterial doppler study in 4/16 was similar to the 12/14 study.  I will arrange for repeat peripheral artery doppler evaluation.  4. Hyperlipidemia: Check lipids today.    5. HTN: BP controlled.   6. Atrial fibrillation: Paroxysmal.  He is in NSR today.  He does not feel palpitations.   - Continue diltiazem CD. - Continue Eliquis 5 mg bid.  Check CBC today.  7. Transient hematuria: He needs urology evaluation.  I will refer.   Given my transition to the CHF clinic, I will have him followup with Dr Stanford Breed at our [facility] office in 1 year if echo and peripheral arterial dopplers look ok.    Loralie Champagne 01/19/2016

## 2016-01-19 NOTE — Patient Instructions (Addendum)
Medication Instructions:  Your physician recommends that you continue on your current medications as directed. Please refer to the Current Medication list given to you today.   Labwork: CBCd Lipid profile today  Testing/Procedures: Your physician has requested that you have an echocardiogram. Echocardiography is a painless test that uses sound waves to create images of your heart. It provides your doctor with information about the size and shape of your heart and how well your heart's chambers and valves are working. This procedure takes approximately one hour. There are no restrictions for this procedure.  Your physician has requested that you have a lower or upper extremity arterial duplex. This test is an ultrasound of the arteries in the legs or arms. It looks at arterial blood flow in the legs and arms. Allow one hour for Lower and Upper Arterial scans. There are no restrictions or special instructions   Follow-Up: Your physician wants you to follow-up in: 1 year with Dr Stanford Breed in the [facility] (July 2018) You will receive a reminder letter in the mail two months in advance. If you don't receive a letter, please call our office to schedule the follow-up appointment.   Any Other Special Instructions Will Be Listed Below (If Applicable).  You have been referred to [facility]      If you need a refill on your cardiac medications before your next appointment, please call your pharmacy.

## 2016-01-20 ENCOUNTER — Other Ambulatory Visit: Payer: Self-pay | Admitting: Cardiology

## 2016-01-20 DIAGNOSIS — I739 Peripheral vascular disease, unspecified: Secondary | ICD-10-CM

## 2016-01-20 DIAGNOSIS — I350 Nonrheumatic aortic (valve) stenosis: Secondary | ICD-10-CM

## 2016-01-20 DIAGNOSIS — E785 Hyperlipidemia, unspecified: Secondary | ICD-10-CM

## 2016-01-20 DIAGNOSIS — R319 Hematuria, unspecified: Secondary | ICD-10-CM

## 2016-01-20 LAB — CBC WITH DIFFERENTIAL/PLATELET
BASOS ABS: 70 {cells}/uL (ref 0–200)
Basophils Relative: 1 %
EOS ABS: 210 {cells}/uL (ref 15–500)
Eosinophils Relative: 3 %
HEMATOCRIT: 40.7 % (ref 38.5–50.0)
HEMOGLOBIN: 13.2 g/dL (ref 13.2–17.1)
LYMPHS ABS: 1750 {cells}/uL (ref 850–3900)
Lymphocytes Relative: 25 %
MCH: 27.7 pg (ref 27.0–33.0)
MCHC: 32.4 g/dL (ref 32.0–36.0)
MCV: 85.5 fL (ref 80.0–100.0)
MONOS PCT: 10 %
MPV: 8.9 fL (ref 7.5–12.5)
Monocytes Absolute: 700 cells/uL (ref 200–950)
NEUTROS ABS: 4270 {cells}/uL (ref 1500–7800)
Neutrophils Relative %: 61 %
Platelets: 354 10*3/uL (ref 140–400)
RBC: 4.76 MIL/uL (ref 4.20–5.80)
RDW: 15.6 % — ABNORMAL HIGH (ref 11.0–15.0)
WBC: 7 10*3/uL (ref 3.8–10.8)

## 2016-01-20 LAB — LIPID PANEL
CHOL/HDL RATIO: 1.7 ratio (ref <=5.0)
Cholesterol: 178 mg/dL (ref 125–200)
HDL: 102 mg/dL (ref >=40)
LDL CALC: 60 mg/dL (ref <130)
Triglycerides: 81 mg/dL (ref <150)
VLDL: 16 mg/dL (ref <30)

## 2016-01-21 ENCOUNTER — Ambulatory Visit (HOSPITAL_COMMUNITY): Payer: PPO | Attending: Cardiovascular Disease

## 2016-01-21 ENCOUNTER — Other Ambulatory Visit (HOSPITAL_COMMUNITY): Payer: Self-pay

## 2016-01-21 DIAGNOSIS — I359 Nonrheumatic aortic valve disorder, unspecified: Secondary | ICD-10-CM | POA: Diagnosis not present

## 2016-01-21 DIAGNOSIS — E785 Hyperlipidemia, unspecified: Secondary | ICD-10-CM | POA: Diagnosis not present

## 2016-01-21 DIAGNOSIS — I517 Cardiomegaly: Secondary | ICD-10-CM | POA: Diagnosis not present

## 2016-01-21 DIAGNOSIS — R319 Hematuria, unspecified: Secondary | ICD-10-CM | POA: Insufficient documentation

## 2016-01-21 DIAGNOSIS — Z951 Presence of aortocoronary bypass graft: Secondary | ICD-10-CM | POA: Diagnosis not present

## 2016-01-21 DIAGNOSIS — I350 Nonrheumatic aortic (valve) stenosis: Secondary | ICD-10-CM

## 2016-01-21 DIAGNOSIS — I2510 Atherosclerotic heart disease of native coronary artery without angina pectoris: Secondary | ICD-10-CM | POA: Insufficient documentation

## 2016-01-21 DIAGNOSIS — I739 Peripheral vascular disease, unspecified: Secondary | ICD-10-CM | POA: Insufficient documentation

## 2016-01-22 ENCOUNTER — Encounter: Payer: Self-pay | Admitting: Cardiology

## 2016-01-23 ENCOUNTER — Inpatient Hospital Stay (HOSPITAL_COMMUNITY): Admission: RE | Admit: 2016-01-23 | Payer: PPO | Source: Ambulatory Visit

## 2016-01-23 ENCOUNTER — Ambulatory Visit (HOSPITAL_COMMUNITY)
Admission: RE | Admit: 2016-01-23 | Discharge: 2016-01-23 | Disposition: A | Discharge status: Home / Self Care | Payer: PPO | Source: Ambulatory Visit | Attending: Cardiology | Admitting: Cardiology

## 2016-01-23 DIAGNOSIS — E785 Hyperlipidemia, unspecified: Secondary | ICD-10-CM | POA: Diagnosis not present

## 2016-01-23 DIAGNOSIS — I739 Peripheral vascular disease, unspecified: Secondary | ICD-10-CM

## 2016-01-23 DIAGNOSIS — R319 Hematuria, unspecified: Secondary | ICD-10-CM | POA: Diagnosis not present

## 2016-01-23 DIAGNOSIS — R938 Abnormal findings on diagnostic imaging of other specified body structures: Secondary | ICD-10-CM | POA: Diagnosis not present

## 2016-01-23 DIAGNOSIS — I10 Essential (primary) hypertension: Secondary | ICD-10-CM | POA: Insufficient documentation

## 2016-01-23 DIAGNOSIS — I2510 Atherosclerotic heart disease of native coronary artery without angina pectoris: Secondary | ICD-10-CM | POA: Diagnosis not present

## 2016-01-23 DIAGNOSIS — I350 Nonrheumatic aortic (valve) stenosis: Secondary | ICD-10-CM

## 2016-02-09 ENCOUNTER — Telehealth: Payer: Self-pay | Admitting: *Deleted

## 2016-02-09 ENCOUNTER — Telehealth: Payer: Self-pay | Admitting: Cardiology

## 2016-02-09 NOTE — Telephone Encounter (Signed)
New message      Pt called requesting that a nurse call him to and give him the results of his test. Please call.

## 2016-02-09 NOTE — Telephone Encounter (Signed)
[place] Dr Junious Silk << Less Detail',event)" href="javascript:;"><< Less Detail    [facility] Dr Lawana Pai  Sent: Wed February 04, 2016 8:41 AM               Message   Pt is scheduled for 09/29 @ 1     [place]

## 2016-02-09 NOTE — Telephone Encounter (Signed)
Spoke with patient LE arterial done 01/23/16

## 2016-03-15 ENCOUNTER — Ambulatory Visit (INDEPENDENT_AMBULATORY_CARE_PROVIDER_SITE_OTHER): Payer: PPO | Admitting: Family Medicine

## 2016-03-15 ENCOUNTER — Encounter: Payer: Self-pay | Admitting: Family Medicine

## 2016-03-15 VITALS — BP 130/60 | HR 87 | Temp 97.6°F | Ht 65.0 in | Wt 144.4 lb

## 2016-03-15 DIAGNOSIS — R413 Other amnesia: Secondary | ICD-10-CM

## 2016-03-15 DIAGNOSIS — R2689 Other abnormalities of gait and mobility: Secondary | ICD-10-CM

## 2016-03-15 NOTE — Patient Instructions (Signed)
Use the Tylenol PM very sparingly.

## 2016-03-15 NOTE — Progress Notes (Signed)
Chief Complaint  Patient presents with  . Memory Loss    started in the last year but seem to be getting worse  . Off Balance    x 3 years    Subjective: Patient is a 82 y.o. male here for balance issues and memory loss.  Balance Over the past 3 years, the patient has been having balance issues. His wife notices that when he walks and he has the worst difficulty when it is dark. He doesn't like to go up stairs as well. He does go to the gym routinely to lift weights. He feels his strength is adequate.  Memory The patient's wife noticed that he is starting to have difficulty with his memory. Mainly with two-part instructions. He is to be an Optometrist with a good memory. It is steadily waiting. This has been going on for about one year. He is still functional.   ROS: Psych: Poor memory Neuro: +Balance issues  Family History  Problem Relation Age of Onset  . Coronary artery disease Father   . Hyperlipidemia Father   . Heart attack Father   . Alcohol abuse Father   . Bipolar disorder Mother   . Heart disease Mother   . Alcohol abuse    . Alcohol abuse Brother   . Cancer Brother   . Cancer Maternal Grandfather     oral cancer, chew tobacco  . Cancer Sister   . Glaucoma Sister    Past Medical History:  Diagnosis Date  . Basal cell carcinoma   . BPH (benign prostatic hypertrophy)   . CAD (coronary artery disease)    a. H/o MI;  b. s/p CABG  . Disequilibrium 04/03/2012  . HTN (hypertension)   . Hyperlipidemia   . Internal hemorrhoids   . Medicare annual wellness visit, subsequent 03/16/2015   Follows with cardiology, Dr Aundra Dubin, Dr Bing Neighbors with dermatology, Dr Altamese Cabal Aged out of colonoscopy  . Moderate aortic stenosis    a. 08/2014 Echo: EF 55-60%, no rwma, Gr 1 DD, mod AS, mildly dil asc Ao, mild MR, sev dil LA.  [name] PAF (paroxysmal atrial fibrillation) ([place])    a. Dx 08/2014;  b. CHA2DS2VASc = 4 - decision made to withold [place] 2/2 unsteady gait and syncope.  .  Sigmoid diverticulitis   . Syncope    a. 08/2014 - ? orthostatic   Allergies  Allergen Reactions  . Toradol [Ketorolac Tromethamine] Other (See Comments)    CNS-AMS    Current Outpatient Prescriptions:  .  acetaminophen (TYLENOL) 325 MG tablet, Take 1-2 tablets (325-650 mg total) by mouth every 6 (six) hours as needed for mild pain or moderate pain., Disp: 40 tablet, Rfl: 0 .  apixaban (ELIQUIS) 5 MG TABS tablet, Take 1 tablet (5 mg total) by mouth 2 (two) times daily., Disp: 60 tablet, Rfl: 0 .  CARTIA XT 120 MG 24 hr capsule, TAKE ONE CAPSULE BY MOUTH ONCE DAILY, Disp: 30 capsule, Rfl: 11 .  cholecalciferol (VITAMIN D) 1000 UNITS tablet, Take 1,000 Units by mouth daily. , Disp: , Rfl:  .  diphenhydramine-acetaminophen (TYLENOL PM) 25-500 MG TABS, Take 1 tablet by mouth at bedtime as needed (insomnia)., Disp: , Rfl:  .  hydrochlorothiazide (MICROZIDE) 12.5 MG capsule, Take 1 capsule (12.5 mg total) by mouth daily., Disp: 90 capsule, Rfl: 3 .  lovastatin (MEVACOR) 20 MG tablet, TAKE ONE TABLET BY MOUTH ONCE DAILY AT BEDTIME, Disp: 90 tablet, Rfl: 3 .  Multiple Vitamin (MULTIVITAMIN) capsule, Take 0.5 capsules by  mouth daily. , Disp: , Rfl:  .  multivitamin-lutein (OCUVITE-LUTEIN) CAPS, Take 1 capsule by mouth daily. , Disp: , Rfl:  .  Probiotic Product (PROBIOTIC DAILY PO), Take 1 capsule by mouth daily., Disp: , Rfl:   Objective: BP 130/60 (BP Location: Right Arm, Patient Position: Sitting, Cuff Size: Normal)   Pulse 87   Temp 97.6 F (36.4 C) (Oral)   Ht 5\' 5"  (1.651 m)   Wt 144 lb 6.4 oz (65.5 kg)   SpO2 98%   BMI 24.03 kg/m  General: Awake, appears stated age HEENT: MMM, EOMi Heart: RRR, 2/6 SEM heard loudest at the aortic listening post radiation of the carotid arteries. No lower extremity edema. Lungs: CTAB, no rales, wheezes or rhonchi. Normal effort Neuro: 2/4 patellar reflex b/l wo clonus, 1/4 calcaneal reflex b/l wo clonus, no resting tremor MSK: Strength 5/5 in LE's  b/l; he has a short stride gait without much arm movement. Psych: Age appropriate judgment and insight, normal affect and mood  Assessment and Plan: Memory loss, short term - Plan: TSH, Comprehensive metabolic panel, CBC, Ambulatory referral to Neurology  Poor balance - Plan: Ambulatory referral to Physical Therapy  Orders as above. MMSE- 29. Use Tylenol PM very sparingly. I do not appreciate any other medicines that could be contributing to his memory or balance issue (maybe HCTZ?). F/u in 6-8 with Dr. Charlett Blake if balance issues not improved with PT. The patient and his wife voiced understanding and agreement to the plan.  [place], DO 03/15/16  3:17 PM

## 2016-03-15 NOTE — Progress Notes (Signed)
Pre visit review using our clinic review tool, if applicable. No additional management support is needed unless otherwise documented below in the visit note. 

## 2016-03-16 LAB — COMPREHENSIVE METABOLIC PANEL
ALK PHOS: 63 U/L (ref 39–117)
ALT: 11 U/L (ref 0–53)
AST: 19 U/L (ref 0–37)
Albumin: 3.7 g/dL (ref 3.5–5.2)
BILIRUBIN TOTAL: 0.5 mg/dL (ref 0.2–1.2)
BUN: 20 mg/dL (ref 6–23)
CALCIUM: 9.2 mg/dL (ref 8.4–10.5)
CO2: 28 meq/L (ref 19–32)
CREATININE: 1.03 mg/dL (ref 0.40–1.50)
Chloride: 103 mEq/L (ref 96–112)
GFR: 72.85 mL/min (ref >60.00)
GLUCOSE: 143 mg/dL — AB (ref 70–99)
Potassium: 4.2 mEq/L (ref 3.5–5.1)
Sodium: 136 mEq/L (ref 135–145)
TOTAL PROTEIN: 7 g/dL (ref 6.0–8.3)

## 2016-03-16 LAB — CBC
HCT: 39.1 % (ref 39.0–52.0)
Hemoglobin: 13.2 g/dL (ref 13.0–17.0)
MCHC: 33.8 g/dL (ref 30.0–36.0)
MCV: 84.5 fl (ref 78.0–100.0)
Platelets: 365 10*3/uL (ref 150.0–400.0)
RBC: 4.63 Mil/uL (ref 4.22–5.81)
RDW: 15.8 % — ABNORMAL HIGH (ref 11.5–15.5)
WBC: 7.6 10*3/uL (ref 4.0–10.5)

## 2016-03-16 LAB — TSH: TSH: 3.11 u[IU]/mL (ref 0.35–4.50)

## 2016-03-23 ENCOUNTER — Ambulatory Visit (HOSPITAL_BASED_OUTPATIENT_CLINIC_OR_DEPARTMENT_OTHER)
Admission: RE | Admit: 2016-03-23 | Discharge: 2016-03-23 | Disposition: A | Discharge status: Home / Self Care | Payer: PPO | Source: Ambulatory Visit | Attending: Family | Admitting: Family

## 2016-03-23 ENCOUNTER — Encounter: Payer: Self-pay | Admitting: Family

## 2016-03-23 ENCOUNTER — Ambulatory Visit: Payer: PPO | Admitting: Physical Therapy

## 2016-03-23 ENCOUNTER — Ambulatory Visit (INDEPENDENT_AMBULATORY_CARE_PROVIDER_SITE_OTHER): Payer: PPO | Admitting: Family

## 2016-03-23 VITALS — BP 108/66 | HR 72 | Temp 97.6°F | Resp 16 | Wt 141.0 lb

## 2016-03-23 DIAGNOSIS — G319 Degenerative disease of nervous system, unspecified: Secondary | ICD-10-CM | POA: Insufficient documentation

## 2016-03-23 DIAGNOSIS — R531 Weakness: Secondary | ICD-10-CM | POA: Insufficient documentation

## 2016-03-23 DIAGNOSIS — I4891 Unspecified atrial fibrillation: Secondary | ICD-10-CM

## 2016-03-23 DIAGNOSIS — R3129 Other microscopic hematuria: Secondary | ICD-10-CM | POA: Diagnosis not present

## 2016-03-23 DIAGNOSIS — I998 Other disorder of circulatory system: Secondary | ICD-10-CM | POA: Insufficient documentation

## 2016-03-23 DIAGNOSIS — R55 Syncope and collapse: Secondary | ICD-10-CM | POA: Diagnosis not present

## 2016-03-23 LAB — COMPREHENSIVE METABOLIC PANEL
ALBUMIN: 3.5 g/dL (ref 3.5–5.2)
ALK PHOS: 59 U/L (ref 39–117)
ALT: 13 U/L (ref 0–53)
AST: 18 U/L (ref 0–37)
BUN: 19 mg/dL (ref 6–23)
CALCIUM: 9 mg/dL (ref 8.4–10.5)
CHLORIDE: 101 meq/L (ref 96–112)
CO2: 33 mEq/L — ABNORMAL HIGH (ref 19–32)
Creatinine, Ser: 1.13 mg/dL (ref 0.40–1.50)
GFR: 65.46 mL/min (ref >60.00)
Glucose, Bld: 96 mg/dL (ref 70–99)
POTASSIUM: 4.6 meq/L (ref 3.5–5.1)
Sodium: 138 mEq/L (ref 135–145)
TOTAL PROTEIN: 6.7 g/dL (ref 6.0–8.3)
Total Bilirubin: 0.6 mg/dL (ref 0.2–1.2)

## 2016-03-23 LAB — CBC WITH DIFFERENTIAL/PLATELET
BASOS PCT: 0.6 % (ref 0.0–3.0)
Basophils Absolute: 0 10*3/uL (ref 0.0–0.1)
Eosinophils Absolute: 0.2 10*3/uL (ref 0.0–0.7)
Eosinophils Relative: 2.5 % (ref 0.0–5.0)
HCT: 41.1 % (ref 39.0–52.0)
Hemoglobin: 13.8 g/dL (ref 13.0–17.0)
LYMPHS ABS: 1.6 10*3/uL (ref 0.7–4.0)
Lymphocytes Relative: 19.4 % (ref 12.0–46.0)
MCHC: 33.6 g/dL (ref 30.0–36.0)
MCV: 85.1 fl (ref 78.0–100.0)
MONO ABS: 0.9 10*3/uL (ref 0.1–1.0)
Monocytes Relative: 11.1 % (ref 3.0–12.0)
NEUTROS PCT: 66.4 % (ref 43.0–77.0)
Neutro Abs: 5.4 10*3/uL (ref 1.4–7.7)
Platelets: 400 10*3/uL (ref 150.0–400.0)
RBC: 4.83 Mil/uL (ref 4.22–5.81)
RDW: 16.3 % — AB (ref 11.5–15.5)
WBC: 8.1 10*3/uL (ref 4.0–10.5)

## 2016-03-23 LAB — URINALYSIS, MICROSCOPIC ONLY

## 2016-03-23 LAB — TSH: TSH: 3.85 u[IU]/mL (ref 0.35–4.50)

## 2016-03-23 LAB — POCT URINALYSIS DIPSTICK
BILIRUBIN UA: NEGATIVE
GLUCOSE UA: NEGATIVE
KETONES UA: NEGATIVE
Leukocytes, UA: NEGATIVE
Nitrite, UA: NEGATIVE
SPEC GRAV UA: 1.02
Urobilinogen, UA: NEGATIVE
pH, UA: 6

## 2016-03-23 NOTE — Progress Notes (Signed)
Pre visit review using our clinic review tool, if applicable. No additional management support is needed unless otherwise documented below in the visit note. 

## 2016-03-23 NOTE — Patient Instructions (Addendum)
Please complete lab work prior to leaving. Complete CT of your head on the first floor. Call if increased weakness, new symptoms, fever. Please drink lots of fluids today.

## 2016-03-23 NOTE — Addendum Note (Signed)
Addended by: Kelle Darting A on: 03/23/2016 10:39 AM   Modules accepted: Orders

## 2016-03-23 NOTE — Progress Notes (Signed)
Subjective:    Patient ID: Justin [name], male    DOB: 01-Aug-1928, 83 y.o.   MRN: [id number]  HPI  Justin [name] is an 82 yr old male who presents today with chief complaint of LE weakness since Sunday 03/21/16.  He did have some dizziness on 9/24. He has not needed hctz for 2-3 months for LE edema.  He continues cartia XT daily in the evening. Denies dysuria/frequency, urgency.  He reports both legs feel weak this AM.  Denies fevers, nausea/vomitting, SOB.  Denies Chest pain, palpitations, Denies diarrhea, good PO intake.    BP Readings from Last 3 Encounters:  03/23/16 108/66  03/15/16 130/60  01/19/16 (!) 142/64     Review of Systems See HPI  Past Medical History:  Diagnosis Date  . Basal cell carcinoma   . BPH (benign prostatic hypertrophy)   . CAD (coronary artery disease)    a. H/o MI;  b. s/p CABG  . Disequilibrium 04/03/2012  . HTN (hypertension)   . Hyperlipidemia   . Internal hemorrhoids   . Medicare annual wellness visit, subsequent 03/16/2015   Follows with cardiology, Dr Aundra Dubin, Dr Bing Neighbors with dermatology, Dr Altamese Cabal Aged out of colonoscopy  . Moderate aortic stenosis    a. 08/2014 Echo: EF 55-60%, no rwma, Gr 1 DD, mod AS, mildly dil asc Ao, mild MR, sev dil LA.  [name] PAF (paroxysmal atrial fibrillation) ([place])    a. Dx 08/2014;  b. CHA2DS2VASc = 4 - decision made to withold [place] 2/2 unsteady gait and syncope.  . Sigmoid diverticulitis   . Syncope    a. 08/2014 - ? orthostatic     Social History   Social History  . Marital status: Married    Spouse name: N/A  . Number of children: 2  . Years of education: N/A   Occupational History  . 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 Smoking status: Former Smoker    Packs/day: 0.50    Years: 15.00    Quit date: 06/29/1971  . Smokeless tobacco: Never Used  . Alcohol use Yes     Comment: 1-2 glasses of wine  . Drug use: No  . Sexual activity: No     Comment: lives with wife,  retired English as a second language teacher, no dietary restrictions   Other Topics Concern  . Not on file   Social History Narrative  . No narrative on file    Past Surgical History:  Procedure Laterality Date  . COLONOSCOPY    . CORONARY ARTERY BYPASS GRAFT    . HIP ARTHROPLASTY Left 10/14/2015   Procedure: LEFT HIP HEMI ARTHROPLASTY;  Surgeon: [name], MD;  Location: [facility];  Service: Orthopedics;  Laterality: Left;  . SHOULDER SURGERY     x2    Family History  Problem Relation Age of Onset  . Coronary artery disease Father   . Hyperlipidemia Father   . Heart attack Father   . Alcohol abuse Father   . Bipolar disorder Mother   . Heart disease Mother   . Alcohol abuse    . Alcohol abuse Brother   . Cancer Brother   . Cancer Maternal Grandfather     oral cancer, chew tobacco  . Cancer Sister   . Glaucoma Sister     Allergies  Allergen Reactions  . Toradol [Ketorolac Tromethamine] Other (See Comments)    CNS-AMS    Current Outpatient Prescriptions on File Prior to Visit  Medication Sig Dispense  Refill  . acetaminophen (TYLENOL) 325 MG tablet Take 1-2 tablets (325-650 mg total) by mouth every 6 (six) hours as needed for mild pain or moderate pain. 40 tablet 0  . apixaban (ELIQUIS) 5 MG TABS tablet Take 1 tablet (5 mg total) by mouth 2 (two) times daily. 60 tablet 0  . CARTIA XT 120 MG 24 hr capsule TAKE ONE CAPSULE BY MOUTH ONCE DAILY 30 capsule 11  . cholecalciferol (VITAMIN D) 1000 UNITS tablet Take 1,000 Units by mouth daily.     . hydrochlorothiazide (MICROZIDE) 12.5 MG capsule Take 1 capsule (12.5 mg total) by mouth daily. (Patient taking differently: Take 12.5 mg by mouth daily as needed. ) 90 capsule 3  . lovastatin (MEVACOR) 20 MG tablet TAKE ONE TABLET BY MOUTH ONCE DAILY AT BEDTIME 90 tablet 3  . Multiple Vitamin (MULTIVITAMIN) capsule Take 0.5 capsules by mouth daily.     . multivitamin-lutein (OCUVITE-LUTEIN) CAPS Take 1 capsule by mouth daily.     .  Probiotic Product (PROBIOTIC DAILY PO) Take 1 capsule by mouth daily.     No current facility-administered medications on file prior to visit.     BP 108/66 (BP Location: Left Arm, Patient Position: Sitting, Cuff Size: Normal)   Pulse 72   Temp 97.6 F (36.4 C) (Oral)   Resp 16   Wt 141 lb (64 kg)   SpO2 98%   BMI 23.46 kg/m       Objective:   Physical Exam  Constitutional: He is oriented to person, place, and time. He appears well-developed and well-nourished. No distress.  HENT:  Head: Normocephalic and atraumatic.  Eyes: EOM are normal. Pupils are equal, round, and reactive to light.  Cardiovascular: Normal rate.   Murmur heard.  Systolic murmur is present with a grade of 2/6  Pulmonary/Chest: Effort normal and breath sounds normal. No respiratory distress. He has no wheezes. He has no rales.  Musculoskeletal: He exhibits no edema.  Neurological: He is alert and oriented to person, place, and time. No cranial nerve deficit. He exhibits normal muscle tone. Coordination normal.  Skin: Skin is warm and dry.  Psychiatric: He has a normal mood and affect. His behavior is normal. Thought content normal.          Assessment & Plan:  Atrial Fibrillation- EKG is personally reviewed- patient is currently in NSR without acute changes.   Wife brings some home readings. This AM at 6AM pt had HR 171 with a sbp of 94. He reports that he did feel weak at that time. Within a few minutes his HR came down into the normal range.  I suspect that his symptoms are due to symptomatic PAF. Rate is currently stable. He is maintained on Eliquis.  I have advised pt to go to the ER if develops symptomatic tachycardia that lasts more than a few minutes and he verbalizes understanding. Continue Cartia.   Weakness- UA notes trace blood. Will send for Micro to confirm microscopic hematuria.  Due to c/o weakness and recent dizziness I will obtain a CT head to rule out CVA.  In addition, will send Urine for  culture to rule out UTI and obtain CMET, CBC, and TSH to evaluate for other possible contributing factors.

## 2016-03-24 LAB — URINE CULTURE: ORGANISM ID, BACTERIA: NO GROWTH

## 2016-03-25 ENCOUNTER — Encounter: Payer: Self-pay | Admitting: Family

## 2016-03-25 ENCOUNTER — Telehealth: Payer: Self-pay | Admitting: Family Medicine

## 2016-03-25 NOTE — Telephone Encounter (Signed)
Patient's spouse stated that it was just discovered that he hasn't been putting the CARTIA XT 120 MG 24 hr capsule medication in his pill box since last Saturday. She is thinking this is the reason for not feeling well. Please advise.   Patient Phone:[phone] Caller: [place]  Patient Relation: Spouse

## 2016-03-25 NOTE — Telephone Encounter (Signed)
That explains why he had a brief episode of rapid atrial fibrillation. Rate should improve as well as np back on [place] xt

## 2016-03-25 NOTE — Telephone Encounter (Signed)
Spoke w/ pt and his wife. He did restart [place] XT today.   Saw MyChart message-he has been feeling somewhat shaky and having elevated BPs since appointment. Pt just took the [place] and will check BP later today. He will follow-up if symptoms do not improve w/ taking meds as directed.

## 2016-03-26 DIAGNOSIS — R310 Gross hematuria: Secondary | ICD-10-CM | POA: Diagnosis not present

## 2016-03-26 DIAGNOSIS — N481 Balanitis: Secondary | ICD-10-CM | POA: Diagnosis not present

## 2016-03-26 DIAGNOSIS — R972 Elevated prostate specific antigen [PSA]: Secondary | ICD-10-CM | POA: Diagnosis not present

## 2016-03-30 DIAGNOSIS — Z23 Encounter for immunization: Secondary | ICD-10-CM | POA: Diagnosis not present

## 2016-04-01 DIAGNOSIS — R310 Gross hematuria: Secondary | ICD-10-CM | POA: Diagnosis not present

## 2016-04-01 DIAGNOSIS — R319 Hematuria, unspecified: Secondary | ICD-10-CM | POA: Diagnosis not present

## 2016-04-08 ENCOUNTER — Ambulatory Visit: Payer: PPO | Attending: Family Medicine | Admitting: Physical Therapy

## 2016-04-08 VITALS — BP 162/90

## 2016-04-08 DIAGNOSIS — R262 Difficulty in walking, not elsewhere classified: Secondary | ICD-10-CM | POA: Insufficient documentation

## 2016-04-08 DIAGNOSIS — R2681 Unsteadiness on feet: Secondary | ICD-10-CM | POA: Diagnosis not present

## 2016-04-08 DIAGNOSIS — R2689 Other abnormalities of gait and mobility: Secondary | ICD-10-CM

## 2016-04-08 DIAGNOSIS — R279 Unspecified lack of coordination: Secondary | ICD-10-CM | POA: Insufficient documentation

## 2016-04-08 NOTE — Therapy (Signed)
[facility] [address]  [place], [place], [address] Phone: [phone]   Fax:  [phone]  Physical Therapy Evaluation  Patient Details  Name: Justin [name] MRN: [id number] Date of Birth: 10/18/1928 Referring Provider: Ames Coupe, DO  Encounter Date: 04/08/2016      PT End of Session - 04/08/16 1105    Visit Number 1   Number of Visits 16   Date for PT Re-Evaluation 06/04/16   Authorization Type [employer] (follow Medicare guidelines)   PT Start Time 1016   PT Stop Time 1105   PT Time Calculation (min) 49 min   Activity Tolerance Patient tolerated treatment well   Behavior During Therapy [facility] for tasks assessed/performed      Past Medical History:  Diagnosis Date  . Basal cell carcinoma   . BPH (benign prostatic hypertrophy)   . CAD (coronary artery disease)    a. H/o MI;  b. s/p CABG  . Disequilibrium 04/03/2012  . HTN (hypertension)   . Hyperlipidemia   . Internal hemorrhoids   . Medicare annual wellness visit, subsequent 03/16/2015   Follows with cardiology, Dr Aundra Dubin, Dr Bing Neighbors with dermatology, Dr Altamese Cabal Aged out of colonoscopy  . Moderate aortic stenosis    a. 08/2014 Echo: EF 55-60%, no rwma, Gr 1 DD, mod AS, mildly dil asc Ao, mild MR, sev dil LA.  [name] PAF (paroxysmal atrial fibrillation) ([place])    a. Dx 08/2014;  b. CHA2DS2VASc = 4 - decision made to withold [place] 2/2 unsteady gait and syncope.  . Sigmoid diverticulitis   . Syncope    a. 08/2014 - ? orthostatic    Past Surgical History:  Procedure Laterality Date  . COLONOSCOPY    . CORONARY ARTERY BYPASS GRAFT    . HIP ARTHROPLASTY Left 10/14/2015   Procedure: LEFT HIP HEMI ARTHROPLASTY;  Surgeon: [name], MD;  Location: [facility];  Service: Orthopedics;  Laterality: Left;  . SHOULDER SURGERY     x2    Vitals:   04/08/16 1038  BP: (!) 162/90         Subjective Assessment - 04/08/16 1024    Subjective Pt  noting worsening balance over a period of time of about 3-4 months and has remained impaired for the past few years. Notes difficulty standing in the dark, quick movements, limited tolerance for head movements while walking, difficulty walking in crowded areas, and difficulty walking on uneven surfaces. Reports at least 6 falls w/in the past year, with fall in April resulting in L hip fracture which required THR.   Pertinent History L hip fracture s/p fall in April 2017 - L hip hemiarthroplasty 10/14/15   Patient Stated Goals "Improve balance and decrease likelihood for falls"   Currently in Pain? No/denies            [facility] PT Assessment - 04/08/16 1017      Assessment   Medical Diagnosis Poor balance   Referring Provider Ames Coupe, DO   Onset Date/Surgical Date --  3-4 yrs   Next MD Visit none for Dr. Nani Ravens; 05/03/16 with [name], MD (PCP)   Prior Therapy 12 weeks rehab after hip surgery     Balance Screen   Has the patient fallen in the past 6 months Yes   How many times? 3   Has the patient had a decrease in activity level because of a fear of falling?  Yes   Is the  patient reluctant to leave their home because of a fear of falling?  Yes     [place] Private residence   Living Arrangements Spouse/significant other   Type of [place] - single point;Grab bars - toilet;Grab bars - tub/shower;Shower seat - built in;Hand held shower head     Prior Function   Level of Independence Independent;Independent with community mobility with device   Vocation Retired   Leisure Working out at fitness center, play golf, walking     Observation/Other Assessments   Focus on Therapeutic Outcomes (FOTO)  Neuromuscular disorder 65% (35% limitation); predicted 69% (31% limitation)     Posture/Postural Control   Posture/Postural Control Postural  limitations   Postural Limitations Forward head;Rounded Shoulders;Increased thoracic kyphosis  scoliosis     ROM / Strength   AROM / PROM / Strength Strength     Strength   Strength Assessment Site Hip;Knee;Ankle   Right/Left Hip Right;Left   Right Hip Flexion 4/5   Right Hip Extension 4-/5   Right Hip ABduction 4+/5   Right Hip ADduction 4+/5   Left Hip Flexion 4/5   Left Hip Extension 4-/5   Left Hip ABduction 4+/5   Left Hip ADduction 4+/5   Right/Left Knee Right;Left   Right Knee Flexion 4+/5   Right Knee Extension 5/5   Left Knee Flexion 4+/5   Left Knee Extension 5/5   Right/Left Ankle Right;Left   Right Ankle Dorsiflexion 4/5   Right Ankle Plantar Flexion 4/5   Right Ankle Inversion 4/5   Right Ankle Eversion 4/5   Left Ankle Dorsiflexion 4/5   Left Ankle Plantar Flexion 4/5   Left Ankle Inversion 4/5   Left Ankle Eversion 4/5     Ambulation/Gait   Gait Pattern Step-through pattern;Wide base of support;Decreased stride length;Decreased step length - right;Decreased step length - left;Poor foot clearance - left;Poor foot clearance - right   Gait velocity 2.55 ft/sec     Balance   Balance Assessed Yes     Standardized Balance Assessment   Standardized Balance Assessment Berg Balance Test;Timed Up and Go Test;10 meter walk test   10 Meter Walk 2.55 ft/sec  12.85" w/o AD; 2.5 ft/sec - 13.12" w/ SPC     Berg Balance Test   Sit to Stand Able to stand  independently using hands   Standing Unsupported Able to stand safely 2 minutes   Sitting with Back Unsupported but Feet Supported on Floor or Stool Able to sit safely and securely 2 minutes   Stand to Sit Sits safely with minimal use of hands   Transfers Able to transfer safely, minor use of hands   Standing Unsupported with Eyes Closed Able to stand 10 seconds with supervision   Standing Ubsupported with Feet Together Needs help to attain position but able to stand for 30 seconds with feet together   From  Standing, Reach Forward with Outstretched Arm Can reach forward >5 cm safely (2")   From Standing Position, Pick up Object from Floor Able to pick up shoe, needs supervision   From Standing Position, Turn to Look Behind Over each Shoulder Looks behind one side only/other side shows less weight shift   Turn 360 Degrees Needs close supervision or verbal cueing   Standing Unsupported, Alternately Place Feet on Step/Stool Able to complete >2 steps/needs minimal assist   Standing Unsupported, One Foot  in [place] to take small step independently and hold 30 seconds   Standing on One Leg Tries to lift leg/unable to hold 3 seconds but remains standing independently   Total Score 36   Berg comment: < 36 high risk for falls (close to 100%)      Timed Up and Go Test   TUG Normal TUG   Normal TUG (seconds) 12.85  w/o AD (but very unsteady step pattern); 14.41" w/ SPC   TUG Comments  >13.5 sec indicates high fall risk                   PT Short Term Goals - 04/08/16 1105      PT SHORT TERM GOAL #1   Title Independent with initial strengthening HEP by 04/30/16   Status New     PT SHORT TERM GOAL #2   Title TUG < 13.5 sec to decrease fall risk by 05/07/16   Status New           PT Long Term Goals - 04/08/16 1105      PT LONG TERM GOAL #1   Title Independent with balance HEP or other advanced HEP as indicated by 06/04/16   Status New     PT LONG TERM GOAL #2   Title Gait speed >/= 2.8 ft/sec with or w/o SPC for improved safety with community ambulation by 06/04/16   Status New     PT LONG TERM GOAL #3   Title Berg balance >/= 48/56 to decrease risk for falls by 06/04/16   Status New               Plan - 04/08/16 1105    Clinical Impression Statement Shields is an 82 y/o male who presents to OP PT for a moderate complexity eval for worsening of balance and gait stability along with decreasing activity tolerance and increased incidence of falls especially over the  past year. Pt notes difficulty standing in the dark, loss of balance with quick movements, limited tolerance for head movements while walking, difficulty walking in crowded areas, and difficulty walking on uneven surfaces. He has experienced at least 6 falls within the past year, with fall in April resulting in L hip fracture which required ORIF with hemiarthroplasty. Pt presents to PT with a print-out of "possible symptoms of vestibular disorders" on which he has indicated several items which apply to his balance issues. Pt denies any pain at time of eval. Assessment revealed mild LE weakness, no worse than 4-/5. Gait speed was decreased to limited community ambulation speeds both with and w/o SPC on 10 MWT and TUG time = 12.85 sec w/o AD (very unsteady) and 14.41 sec with SPC, which indicates a high risk for falls. Balance testing via the Berg Balance Scale [phone]) also indicates a high risk (close to 100%) for falls.  Pt will benefit from further vestibular assessment as well as dynamic/functional gait evaluation. POC will focus on core/proximal LE strengthening to improve balance, vestibular treatment as indicated per assessment, along with balance and dynamic gait activities to decrease risk for further falls.   Rehab Potential Good   Clinical Impairments Affecting Rehab Potential L hip fracture s/p fall in April 2017 - L hip hemiarthroplasty 10/14/15, HTN, afib, syncope, orthostatic hypotension   PT Frequency 2x / week   PT Duration 8 weeks   PT Treatment/Interventions Patient/family education;Neuromuscular re-education;Balance training;Vestibular;Therapeutic exercise;Therapeutic activities;Functional mobility training;Gait training;ADLs/Self Care Home Management   PT Next Visit Plan Vestibular  assessment, dynamic gait assessment with FGA or DGI   Consulted and Agree with Plan of Care Patient      Patient will benefit from skilled therapeutic intervention in order to improve the following deficits  and impairments:  Decreased balance, Decreased coordination, Difficulty walking, Abnormal gait, Postural dysfunction, Decreased strength, Decreased activity tolerance, Decreased endurance, Impaired vision/preception, Impaired perceived functional ability, Decreased safety awareness  Visit Diagnosis: Unsteadiness on feet  Unspecified lack of coordination  Other abnormalities of gait and mobility  Difficulty in walking, not elsewhere classified      G-Codes - 04/12/2016 1105    Functional Assessment Tool Used Berg = 36/56 (35.7% limitation) + clinical judgement   Functional Limitation Mobility: Walking and moving around   Mobility: Walking and Moving Around Current Status [phone]) At least 40 percent but less than 60 percent impaired, limited or restricted   Mobility: Walking and Moving Around Goal Status [phone]) At least 20 percent but less than 40 percent impaired, limited or restricted       Problem List Patient Active Problem List   Diagnosis Date Noted  . Fall   . Closed left hip fracture ([place]) 10/13/2015  . Medicare annual wellness visit, subsequent 03/16/2015  . Paroxysmal atrial fibrillation ([place]) 09/26/2014  . Orthostatic hypotension 09/15/2014  . Syncope   . PAF (paroxysmal atrial fibrillation) ([place])   . Head trauma 09/14/2014  . Alteration in anticoagulation 09/14/2014  . Tinnitus 03/21/2014  . Epigastric pain 02/01/2013  . Gait instability 01/01/2013  . Disequilibrium 04/03/2012  . Hyperglycemia 09/13/2011  . Aortic stenosis 03/10/2011  . CAD, ARTERY BYPASS GRAFT 02/23/2010  . Hyperlipidemia 07/04/2008  . Essential hypertension 07/04/2008  . Coronary atherosclerosis 07/04/2008  . RHINITIS 07/04/2008  . BENIGN PROSTATIC HYPERTROPHY, WITH OBSTRUCTION 07/04/2008    Percival Spanish, PT, MPT April 12, 2016, 6:38 PM  [facility] [address] [place], [place], [address] Phone: [phone]   Fax:   [phone]  Name: Justin [name] MRN: [id number] Date of Birth: 01/20/1929

## 2016-04-12 ENCOUNTER — Ambulatory Visit: Payer: PPO | Admitting: Physical Therapy

## 2016-04-12 DIAGNOSIS — R2681 Unsteadiness on feet: Secondary | ICD-10-CM

## 2016-04-12 DIAGNOSIS — R2689 Other abnormalities of gait and mobility: Secondary | ICD-10-CM

## 2016-04-12 DIAGNOSIS — R279 Unspecified lack of coordination: Secondary | ICD-10-CM

## 2016-04-12 DIAGNOSIS — R262 Difficulty in walking, not elsewhere classified: Secondary | ICD-10-CM

## 2016-04-12 NOTE — Therapy (Signed)
[facility] [address]  [place], [place], [address] Phone: [phone]   Fax:  [phone]  Physical Therapy Treatment  Patient Details  Name: Justin [name] MRN: [id number] Date of Birth: 25-Jul-1928 Referring Provider: Ames Coupe, DO  Encounter Date: 04/12/2016      PT End of Session - 04/12/16 1315    Visit Number 2   Number of Visits 16   Date for PT Re-Evaluation 06/04/16   Authorization Type HT Advantage (follow Medicare guidelines)   PT Start Time 1315   PT Stop Time 1359   PT Time Calculation (min) 44 min   Activity Tolerance Patient tolerated treatment well   Behavior During Therapy [facility] for tasks assessed/performed      Past Medical History:  Diagnosis Date  . Basal cell carcinoma   . BPH (benign prostatic hypertrophy)   . CAD (coronary artery disease)    a. H/o MI;  b. s/p CABG  . Disequilibrium 04/03/2012  . HTN (hypertension)   . Hyperlipidemia   . Internal hemorrhoids   . Medicare annual wellness visit, subsequent 03/16/2015   Follows with cardiology, Dr Aundra Dubin, Dr Bing Neighbors with dermatology, Dr Altamese Cabal Aged out of colonoscopy  . Moderate aortic stenosis    a. 08/2014 Echo: EF 55-60%, no rwma, Gr 1 DD, mod AS, mildly dil asc Ao, mild MR, sev dil LA.  [name] PAF (paroxysmal atrial fibrillation) ([place])    a. Dx 08/2014;  b. CHA2DS2VASc = 4 - decision made to withold [place] 2/2 unsteady gait and syncope.  . Sigmoid diverticulitis   . Syncope    a. 08/2014 - ? orthostatic    Past Surgical History:  Procedure Laterality Date  . COLONOSCOPY    . CORONARY ARTERY BYPASS GRAFT    . HIP ARTHROPLASTY Left 10/14/2015   Procedure: LEFT HIP HEMI ARTHROPLASTY;  Surgeon: [name], MD;  Location: [facility];  Service: Orthopedics;  Laterality: Left;  . SHOULDER SURGERY     x2    There were no vitals filed for this visit.          [facility] PT Assessment - 04/12/16 1315      Functional Gait   Assessment   Gait assessed  Yes   Gait Level Surface Walks 20 ft, slow speed, abnormal gait pattern, evidence for imbalance or deviates 10-15 in outside of the 12 in walkway width. Requires more than 7 sec to ambulate 20 ft.   Change in Gait Speed Able to change speed, demonstrates mild gait deviations, deviates 6-10 in outside of the 12 in walkway width, or no gait deviations, unable to achieve a major change in velocity, or uses a change in velocity, or uses an assistive device.   Gait with Horizontal Head Turns Performs head turns with moderate changes in gait velocity, slows down, deviates 10-15 in outside 12 in walkway width but recovers, can continue to walk.   Gait with Vertical Head Turns Performs task with moderate change in gait velocity, slows down, deviates 10-15 in outside 12 in walkway width but recovers, can continue to walk.   Gait and Pivot Turn Pivot turns safely in greater than 3 sec and stops with no loss of balance, or pivot turns safely within 3 sec and stops with mild imbalance, requires small steps to catch balance.   Step Over Obstacle Cannot perform without assistance.   Gait with Narrow Base of Support Ambulates less than 4 steps heel to toe  or cannot perform without assistance.   Gait with Eyes Closed Cannot walk 20 ft without assistance, severe gait deviations or imbalance, deviates greater than 15 in outside 12 in walkway width or will not attempt task.   Ambulating Backwards Cannot walk 20 ft without assistance, severe gait deviations or imbalance, deviates greater than 15 in outside 12 in walkway width or will not attempt task.   Steps Alternating feet, must use rail.   Total Score 9   FGA comment: < 19 = high risk fall            Vestibular Assessment - 04/12/16 1315      Symptom Behavior   Type of Dizziness --  "flakey" like a "hangover", "shakey"   Frequency of Dizziness first thing upon rising in the morning; after prolonged standing   Duration of  Dizziness "couple minutes"   Aggravating Factors --  rising from bed, prolonged standing   Relieving Factors Rest     Occulomotor Exam   Occulomotor Alignment Normal   Spontaneous Absent   Gaze-induced Absent   Smooth Pursuits Intact   Saccades Intact     Vestibulo-Occular Reflex   VOR 1 Head Only (x 1 viewing) Normal   VOR Cancellation Unable to maintain gaze     Positional Testing   Dix-Hallpike Dix-Hallpike Right;Dix-Hallpike Left     Dix-Hallpike Right   Dix-Hallpike Right Symptoms No nystagmus     Dix-Hallpike Left   Dix-Hallpike Left Symptoms No nystagmus     Orthostatics   BP sitting 142/74   BP standing (after 1 minute) 138/78                 [facility] Adult PT Treatment/Exercise - 04/12/16 1315      Knee/Hip Exercises: Standing   Heel Raises Both;20 reps;2 seconds   Hip Flexion Both;20 reps;Knee bent   Hip Abduction Both;20 reps;Knee straight   Hip Extension Both;20 reps;Knee straight   Functional Squat 10 reps;3 seconds                PT Education - 04/12/16 1400    Education provided Yes   Education Details Balance and vestibular assessment findings; Initial standing HEP   Person(s) Educated Patient   Methods Explanation;Demonstration;Verbal cues;Handout   Comprehension Verbalized understanding;Returned demonstration;Verbal cues required          PT Short Term Goals - 04/12/16 1400      PT SHORT TERM GOAL #1   Title Independent with initial strengthening HEP by 04/30/16   Status On-going     PT SHORT TERM GOAL #2   Title TUG < 13.5 sec to decrease fall risk by 05/07/16   Status On-going           PT Long Term Goals - 04/12/16 1400      PT LONG TERM GOAL #1   Title Independent with balance HEP or other advanced HEP as indicated by 06/04/16   Status On-going     PT LONG TERM GOAL #2   Title Gait speed >/= 2.8 ft/sec with or w/o SPC for improved safety with community ambulation by 06/04/16   Status On-going     PT LONG  TERM GOAL #3   Title Berg balance >/= 48/56 to decrease risk for falls by 06/04/16   Status On-going     PT LONG TERM GOAL #4   Title FGA >/= 19/30 to improve gait stability and reduce fall risk by 06/04/16   Status New  Plan - 04/12/16 1400    Clinical Impression Statement Completed verstibular and orthostatic BP assessment without remarkable findings. Dynamic gait assessment with FGA revealed high risk for falls, therefore will plan to introduce OTAGO balance program at next visit. Provided basic standing LE strengthening HEP today using edge or counter/sink for support.   Rehab Potential Good   Clinical Impairments Affecting Rehab Potential L hip fracture s/p fall in April 2017 - L hip hemiarthroplasty 10/14/15, HTN, afib, syncope, orthostatic hypotension   PT Treatment/Interventions Patient/family education;Neuromuscular re-education;Balance training;Vestibular;Therapeutic exercise;Therapeutic activities;Functional mobility training;Gait training;ADLs/Self Care Home Management   PT Next Visit Plan Review initial HEP, Introduce OTAGO fall prevention program   Consulted and Agree with Plan of Care Patient      Patient will benefit from skilled therapeutic intervention in order to improve the following deficits and impairments:  Decreased balance, Decreased coordination, Difficulty walking, Abnormal gait, Postural dysfunction, Decreased strength, Decreased activity tolerance, Decreased endurance, Impaired vision/preception, Impaired perceived functional ability, Decreased safety awareness  Visit Diagnosis: Unsteadiness on feet  Unspecified lack of coordination  Other abnormalities of gait and mobility  Difficulty in walking, not elsewhere classified     Problem List Patient Active Problem List   Diagnosis Date Noted  . Fall   . Closed left hip fracture ([place]) 10/13/2015  . Medicare annual wellness visit, subsequent 03/16/2015  . Paroxysmal atrial fibrillation  ([place]) 09/26/2014  . Orthostatic hypotension 09/15/2014  . Syncope   . PAF (paroxysmal atrial fibrillation) ([place])   . Head trauma 09/14/2014  . Alteration in anticoagulation 09/14/2014  . Tinnitus 03/21/2014  . Epigastric pain 02/01/2013  . Gait instability 01/01/2013  . Disequilibrium 04/03/2012  . Hyperglycemia 09/13/2011  . Aortic stenosis 03/10/2011  . CAD, ARTERY BYPASS GRAFT 02/23/2010  . Hyperlipidemia 07/04/2008  . Essential hypertension 07/04/2008  . Coronary atherosclerosis 07/04/2008  . RHINITIS 07/04/2008  . BENIGN PROSTATIC HYPERTROPHY, WITH OBSTRUCTION 07/04/2008    Percival Spanish, PT, MPT 04/12/2016, 2:17 PM  [facility] [place], [place], [address] Phone: [phone]   Fax:  [phone]  Name: Justin [name] MRN: [id number] Date of Birth: September 22, 1928

## 2016-04-14 ENCOUNTER — Ambulatory Visit: Payer: PPO | Admitting: Physical Therapy

## 2016-04-14 ENCOUNTER — Ambulatory Visit: Payer: PPO

## 2016-04-14 DIAGNOSIS — R2689 Other abnormalities of gait and mobility: Secondary | ICD-10-CM

## 2016-04-14 DIAGNOSIS — R2681 Unsteadiness on feet: Secondary | ICD-10-CM

## 2016-04-14 DIAGNOSIS — R262 Difficulty in walking, not elsewhere classified: Secondary | ICD-10-CM

## 2016-04-14 DIAGNOSIS — R279 Unspecified lack of coordination: Secondary | ICD-10-CM

## 2016-04-14 NOTE — Therapy (Signed)
[facility] [address]  [place], [place], [address] Phone: [phone]   Fax:  [phone]  Physical Therapy Treatment  Patient Details  Name: Justin [name] MRN: [id number] Date of Birth: 1929/01/02 Referring Provider: Ames Coupe, DO  Encounter Date: 04/14/2016      PT End of Session - 04/14/16 1844    Visit Number 3   Number of Visits 16   Date for PT Re-Evaluation 06/04/16   Authorization Type HT Advantage (follow Medicare guidelines)   PT Start Time 1400   PT Stop Time 1444   PT Time Calculation (min) 44 min   Activity Tolerance Patient tolerated treatment well   Behavior During Therapy [facility] for tasks assessed/performed      Past Medical History:  Diagnosis Date  . Basal cell carcinoma   . BPH (benign prostatic hypertrophy)   . CAD (coronary artery disease)    a. H/o MI;  b. s/p CABG  . Disequilibrium 04/03/2012  . HTN (hypertension)   . Hyperlipidemia   . Internal hemorrhoids   . Medicare annual wellness visit, subsequent 03/16/2015   Follows with cardiology, Dr Aundra Dubin, Dr Bing Neighbors with dermatology, Dr Altamese Cabal Aged out of colonoscopy  . Moderate aortic stenosis    a. 08/2014 Echo: EF 55-60%, no rwma, Gr 1 DD, mod AS, mildly dil asc Ao, mild MR, sev dil LA.  [name] PAF (paroxysmal atrial fibrillation) ([place])    a. Dx 08/2014;  b. CHA2DS2VASc = 4 - decision made to withold [place] 2/2 unsteady gait and syncope.  . Sigmoid diverticulitis   . Syncope    a. 08/2014 - ? orthostatic    Past Surgical History:  Procedure Laterality Date  . COLONOSCOPY    . CORONARY ARTERY BYPASS GRAFT    . HIP ARTHROPLASTY Left 10/14/2015   Procedure: LEFT HIP HEMI ARTHROPLASTY;  Surgeon: [name], MD;  Location: [facility];  Service: Orthopedics;  Laterality: Left;  . SHOULDER SURGERY     x2    There were no vitals filed for this visit.      Subjective Assessment - 04/14/16 1843    Subjective Pt. reporting  he is performing HEP without issue.   Patient Stated Goals "Improve balance and decrease likelihood for falls"   Currently in Pain? No/denies   Multiple Pain Sites No           [facility] Adult PT Treatment/Exercise - 04/14/16 1419      Knee/Hip Exercises: Standing   Heel Raises Both;20 reps;2 seconds   Hip Flexion Both;20 reps;Knee bent   Hip Abduction Both;20 reps;Knee straight   Hip Extension Both;20 reps;Knee straight   Functional Squat 15 reps;3 sets             Balance Exercises - 04/14/16 1852      OTAGO PROGRAM   Hip ABductor 10 reps   Ankle Dorsiflexors 20 reps, support   Knee Bends 10 reps, support   Sideways Walking Assistive device   One Leg Stand 10 seconds, support   Toe Walk Support   Heel Toe Walking Backward No support  support   Sit to Stand 10 reps, bilateral support           PT Education - 04/14/16 1856    Education provided Yes   Education Details OTAGO balance HEP; (all with hold support on kitchen counter: sideways walking, backwards walking, heel toe walking, one leg stand, toe walking, standing knee flexion, toe raise (  dorsiflexion), sit<>stand 2 UE support           PT Short Term Goals - 04/12/16 1400      PT SHORT TERM GOAL #1   Title Independent with initial strengthening HEP by 04/30/16   Status On-going     PT SHORT TERM GOAL #2   Title TUG < 13.5 sec to decrease fall risk by 05/07/16   Status On-going           PT Long Term Goals - 04/12/16 1400      PT LONG TERM GOAL #1   Title Independent with balance HEP or other advanced HEP as indicated by 06/04/16   Status On-going     PT LONG TERM GOAL #2   Title Gait speed >/= 2.8 ft/sec with or w/o SPC for improved safety with community ambulation by 06/04/16   Status On-going     PT LONG TERM GOAL #3   Title Berg balance >/= 48/56 to decrease risk for falls by 06/04/16   Status On-going     PT LONG TERM GOAL #4   Title FGA >/= 19/30 to improve gait stability and reduce  fall risk by 06/04/16   Status New               Plan - 04/14/16 1845    Clinical Impression Statement Basic standing balance HEP reviewed with pt. today.  Pt. required some cueing to prevent trunk movement with this however demo'd good stability with UE support on treadmill.  OTAGO balance activities demonstrated with pt. today and added to HEP (see pt. education).  Pt. instructed throughout therapy session today to perform all initial and additional HEP activities added with UE support.  Will plan to monitor pt. response to updated OTAGO balance HEP upon return to therapy on 11.1.17.     PT Treatment/Interventions Patient/family education;Neuromuscular re-education;Balance training;Vestibular;Therapeutic exercise;Therapeutic activities;Functional mobility training;Gait training;ADLs/Self Care Home Management   PT Next Visit Plan review [place] HEP; Continue hip strengthening; balance training      Patient will benefit from skilled therapeutic intervention in order to improve the following deficits and impairments:  Decreased balance, Decreased coordination, Difficulty walking, Abnormal gait, Postural dysfunction, Decreased strength, Decreased activity tolerance, Decreased endurance, Impaired vision/preception, Impaired perceived functional ability, Decreased safety awareness  Visit Diagnosis: Unsteadiness on feet  Unspecified lack of coordination  Other abnormalities of gait and mobility  Difficulty in walking, not elsewhere classified     Problem List Patient Active Problem List   Diagnosis Date Noted  . Fall   . Closed left hip fracture ([place]) 10/13/2015  . Medicare annual wellness visit, subsequent 03/16/2015  . Paroxysmal atrial fibrillation ([place]) 09/26/2014  . Orthostatic hypotension 09/15/2014  . Syncope   . PAF (paroxysmal atrial fibrillation) ([place])   . Head trauma 09/14/2014  . Alteration in anticoagulation 09/14/2014  . Tinnitus 03/21/2014  . Epigastric pain  02/01/2013  . Gait instability 01/01/2013  . Disequilibrium 04/03/2012  . Hyperglycemia 09/13/2011  . Aortic stenosis 03/10/2011  . CAD, ARTERY BYPASS GRAFT 02/23/2010  . Hyperlipidemia 07/04/2008  . Essential hypertension 07/04/2008  . Coronary atherosclerosis 07/04/2008  . RHINITIS 07/04/2008  . BENIGN PROSTATIC HYPERTROPHY, WITH OBSTRUCTION 07/04/2008    Bess Harvest, PTA 04/14/16 7:03 PM  [facility] [place], [place], [address] Phone: [phone]   Fax:  [phone]  Name: Justin [name] MRN: [id number] Date of Birth: 1928/07/10

## 2016-04-15 ENCOUNTER — Telehealth: Payer: Self-pay | Admitting: Cardiology

## 2016-04-15 NOTE — Telephone Encounter (Signed)
Pt states his wife has been on warfarin for years and he knows it will be less expensive than Eliquis.

## 2016-04-15 NOTE — Telephone Encounter (Signed)
Pt states he is not interested in pursing patient assistance for Eliquis, he prefers to change to warfarin permanently.  Pt advised I will forward to Dr Aundra Dubin for review.

## 2016-04-15 NOTE — Telephone Encounter (Signed)
Pt in the doughnut hole with insurance, had to come out of pocket for Eliquis over 300 for a 3 month supply, wants to ask Dr. Aundra Dubin if he can change to Warfarin-pls advise

## 2016-04-15 NOTE — Telephone Encounter (Signed)
Pt states he recently  paid $300 for 3 month supply of Eliquis because he is in the doughnut hole. He states he will be out of his current supply of Eliquis before the end of the year.  Pt is requesting to change to warfarin  when he runs out of his current supply of Eliquis.

## 2016-04-15 NOTE — Telephone Encounter (Signed)
Pt states he will use his current supply of Eliquis before changing over to warfarin. Pt states he has enough Eliquis to last until right before the end of the year. Pt states he will call back when he has a 7-10 day supply of Eliquis  so arrangements can be made with the [facility] for help with transitioning from Eliquis to warfarin.

## 2016-04-15 NOTE — Telephone Encounter (Signed)
Ok to switch to warfarin, arrange through coumadin clinic

## 2016-04-16 ENCOUNTER — Encounter: Payer: PPO | Admitting: Physical Therapy

## 2016-04-16 DIAGNOSIS — Z961 Presence of intraocular lens: Secondary | ICD-10-CM | POA: Diagnosis not present

## 2016-04-16 DIAGNOSIS — H353130 Nonexudative age-related macular degeneration, bilateral, stage unspecified: Secondary | ICD-10-CM | POA: Diagnosis not present

## 2016-04-16 DIAGNOSIS — H1132 Conjunctival hemorrhage, left eye: Secondary | ICD-10-CM | POA: Diagnosis not present

## 2016-04-16 DIAGNOSIS — H35033 Hypertensive retinopathy, bilateral: Secondary | ICD-10-CM | POA: Diagnosis not present

## 2016-04-21 ENCOUNTER — Encounter: Payer: PPO | Admitting: Physical Therapy

## 2016-04-23 ENCOUNTER — Encounter: Payer: PPO | Admitting: Physical Therapy

## 2016-04-28 ENCOUNTER — Ambulatory Visit: Payer: PPO | Attending: Family Medicine

## 2016-04-28 ENCOUNTER — Telehealth: Payer: Self-pay | Admitting: Family Medicine

## 2016-04-28 DIAGNOSIS — R2681 Unsteadiness on feet: Secondary | ICD-10-CM | POA: Diagnosis not present

## 2016-04-28 DIAGNOSIS — R279 Unspecified lack of coordination: Secondary | ICD-10-CM | POA: Diagnosis not present

## 2016-04-28 DIAGNOSIS — R2689 Other abnormalities of gait and mobility: Secondary | ICD-10-CM

## 2016-04-28 DIAGNOSIS — R262 Difficulty in walking, not elsewhere classified: Secondary | ICD-10-CM

## 2016-04-28 NOTE — Telephone Encounter (Signed)
Pt dropped off document to be filled out (Disability Parking Placard), please call pt when ready Tel [phone]. Document put at front office tray.

## 2016-04-28 NOTE — Therapy (Deleted)
[facility] [address]  [place], [place], [address] Phone: [phone]   Fax:  [phone]  Physical Therapy Treatment  Patient Details  Name: Justin [name] MRN: [id number] Date of Birth: 1928-11-08 Referring Provider: Ames Coupe, DO  Encounter Date: 04/28/2016      PT End of Session - 04/28/16 0806    Visit Number 4   Number of Visits 16   Date for PT Re-Evaluation 06/04/16   Authorization Type HT Advantage (follow Medicare guidelines)   PT Start Time 0800   PT Stop Time 0840   PT Time Calculation (min) 40 min   Activity Tolerance Patient tolerated treatment well   Behavior During Therapy [facility] for tasks assessed/performed      Past Medical History:  Diagnosis Date  . Basal cell carcinoma   . BPH (benign prostatic hypertrophy)   . CAD (coronary artery disease)    a. H/o MI;  b. s/p CABG  . Disequilibrium 04/03/2012  . HTN (hypertension)   . Hyperlipidemia   . Internal hemorrhoids   . Medicare annual wellness visit, subsequent 03/16/2015   Follows with cardiology, Dr Aundra Dubin, Dr Bing Neighbors with dermatology, Dr Altamese Cabal Aged out of colonoscopy  . Moderate aortic stenosis    a. 08/2014 Echo: EF 55-60%, no rwma, Gr 1 DD, mod AS, mildly dil asc Ao, mild MR, sev dil LA.  [name] PAF (paroxysmal atrial fibrillation) ([place])    a. Dx 08/2014;  b. CHA2DS2VASc = 4 - decision made to withold [place] 2/2 unsteady gait and syncope.  . Sigmoid diverticulitis   . Syncope    a. 08/2014 - ? orthostatic    Past Surgical History:  Procedure Laterality Date  . COLONOSCOPY    . CORONARY ARTERY BYPASS GRAFT    . HIP ARTHROPLASTY Left 10/14/2015   Procedure: LEFT HIP HEMI ARTHROPLASTY;  Surgeon: [name], MD;  Location: [facility];  Service: Orthopedics;  Laterality: Left;  . SHOULDER SURGERY     x2    There were no vitals filed for this visit.      Subjective Assessment - 04/28/16 0805    Subjective Pt. reports  performing OTAGO balance activities addition to HEP every other day since last treatment without issue.     Patient Stated Goals "Improve balance and decrease likelihood for falls"   Currently in Pain? No/denies   Multiple Pain Sites No            Balance Exercises - 04/28/16 0811      OTAGO PROGRAM   Hip ABductor 10 reps   Ankle Dorsiflexors 20 reps, support   Knee Bends 10 reps, support   Sideways Walking Assistive device   One Leg Stand 10 seconds, support   Toe Walk Support   Heel Toe Walking Backward No support   Sit to Stand 10 reps, bilateral support   Overall OTAGO Comments pt. requiring min cues for recall and proper technique             PT Short Term Goals - 04/28/16 0832      PT SHORT TERM GOAL #1   Title Independent with initial strengthening HEP by 04/30/16   Status Achieved     PT SHORT TERM GOAL #2   Title TUG < 13.5 sec to decrease fall risk by 05/07/16   Status On-going           PT Long Term Goals - 04/12/16 1400  PT LONG TERM GOAL #1   Title Independent with balance HEP or other advanced HEP as indicated by 06/04/16   Status On-going     PT LONG TERM GOAL #2   Title Gait speed >/= 2.8 ft/sec with or w/o SPC for improved safety with community ambulation by 06/04/16   Status On-going     PT LONG TERM GOAL #3   Title Berg balance >/= 48/56 to decrease risk for falls by 06/04/16   Status On-going     PT LONG TERM GOAL #4   Title FGA >/= 19/30 to improve gait stability and reduce fall risk by 06/04/16   Status New               Plan - 04/28/16 0833    Clinical Impression Statement d   PT Treatment/Interventions Patient/family education;Neuromuscular re-education;Balance training;Vestibular;Therapeutic exercise;Therapeutic activities;Functional mobility training;Gait training;ADLs/Self Care Home Management   PT Next Visit Plan Continue hip strengthening; balance training      Patient will benefit from skilled therapeutic  intervention in order to improve the following deficits and impairments:  Decreased balance, Decreased coordination, Difficulty walking, Abnormal gait, Postural dysfunction, Decreased strength, Decreased activity tolerance, Decreased endurance, Impaired vision/preception, Impaired perceived functional ability, Decreased safety awareness  Visit Diagnosis: Unsteadiness on feet  Unspecified lack of coordination  Other abnormalities of gait and mobility  Difficulty in walking, not elsewhere classified     Problem List Patient Active Problem List   Diagnosis Date Noted  . Fall   . Closed left hip fracture ([place]) 10/13/2015  . Medicare annual wellness visit, subsequent 03/16/2015  . Paroxysmal atrial fibrillation ([place]) 09/26/2014  . Orthostatic hypotension 09/15/2014  . Syncope   . PAF (paroxysmal atrial fibrillation) ([place])   . Head trauma 09/14/2014  . Alteration in anticoagulation 09/14/2014  . Tinnitus 03/21/2014  . Epigastric pain 02/01/2013  . Gait instability 01/01/2013  . Disequilibrium 04/03/2012  . Hyperglycemia 09/13/2011  . Aortic stenosis 03/10/2011  . CAD, ARTERY BYPASS GRAFT 02/23/2010  . Hyperlipidemia 07/04/2008  . Essential hypertension 07/04/2008  . Coronary atherosclerosis 07/04/2008  . RHINITIS 07/04/2008  . BENIGN PROSTATIC HYPERTROPHY, WITH OBSTRUCTION 07/04/2008    Bess Harvest, PTA 04/28/16 8:34 AM   [facility] [place], [place], [address] Phone: [phone]   Fax:  [phone]  Name: Justin [name] MRN: [id number] Date of Birth: 06-18-1929

## 2016-04-28 NOTE — Therapy (Signed)
[facility] [address]  [place], [place], [address] Phone: [phone]   Fax:  [phone]  Physical Therapy Treatment  Patient Details  Name: AVERETT MAGGIO MRN: [id number] Date of Birth: 11/19/1928 Referring Provider: Ames Coupe, DO  Encounter Date: 04/28/2016      PT End of Session - 04/28/16 0806    Visit Number 4   Number of Visits 16   Date for PT Re-Evaluation 06/04/16   Authorization Type [employer] (follow Medicare guidelines)   PT Start Time 0800   PT Stop Time 0840   PT Time Calculation (min) 40 min   Activity Tolerance Patient tolerated treatment well   Behavior During Therapy [facility] for tasks assessed/performed      Past Medical History:  Diagnosis Date  . Basal cell carcinoma   . BPH (benign prostatic hypertrophy)   . CAD (coronary artery disease)    a. H/o MI;  b. s/p CABG  . Disequilibrium 04/03/2012  . HTN (hypertension)   . Hyperlipidemia   . Internal hemorrhoids   . Medicare annual wellness visit, subsequent 03/16/2015   Follows with cardiology, Dr Aundra Dubin, Dr Bing Neighbors with dermatology, Dr Altamese Cabal Aged out of colonoscopy  . Moderate aortic stenosis    a. 08/2014 Echo: EF 55-60%, no rwma, Gr 1 DD, mod AS, mildly dil asc Ao, mild MR, sev dil LA.  [name] PAF (paroxysmal atrial fibrillation) ([place])    a. Dx 08/2014;  b. CHA2DS2VASc = 4 - decision made to withold [place] 2/2 unsteady gait and syncope.  . Sigmoid diverticulitis   . Syncope    a. 08/2014 - ? orthostatic    Past Surgical History:  Procedure Laterality Date  . COLONOSCOPY    . CORONARY ARTERY BYPASS GRAFT    . HIP ARTHROPLASTY Left 10/14/2015   Procedure: LEFT HIP HEMI ARTHROPLASTY;  Surgeon: [name], MD;  Location: [place] [facility];  Service: Orthopedics;  Laterality: Left;  . SHOULDER SURGERY     x2    There were no vitals filed for this visit.      Subjective Assessment - 04/28/16 0805    Subjective Pt. reports  performing OTAGO balance activities addition to HEP every other day since last treatment without issue.     Patient Stated Goals "Improve balance and decrease likelihood for falls"   Currently in Pain? No/denies   Multiple Pain Sites No          [facility] Adult PT Treatment/Exercise - 04/28/16 0843      Ambulation/Gait   Ambulation/Gait Yes   Ambulation/Gait Assistance 5: Supervision   Ambulation Distance (Feet) 500 Feet   Assistive device Straight cane   Gait Pattern Step-through pattern;Wide base of support;Decreased stride length;Decreased step length - right;Decreased step length - left;Poor foot clearance - left;Poor foot clearance - right   Gait Comments verbal cues provided for proper sequencing, increased stride length on R LE, and SPC placement closer to BOS             Balance Exercises - 04/28/16 0811      OTAGO PROGRAM   Hip ABductor 10 reps   Ankle Dorsiflexors 20 reps, support   Knee Bends 10 reps, support   Sideways Walking Assistive device   One Leg Stand 10 seconds, support   Toe Walk Support   Heel Toe Walking Backward No support   Sit to Stand 10 reps, bilateral support   Overall OTAGO Comments pt. requiring min cues  for recall and proper technique             PT Short Term Goals - 04/28/16 0832      PT SHORT TERM GOAL #1   Title Independent with initial strengthening HEP by 04/30/16   Status Achieved     PT SHORT TERM GOAL #2   Title TUG < 13.5 sec to decrease fall risk by 05/07/16   Status On-going           PT Long Term Goals - 04/12/16 1400      PT LONG TERM GOAL #1   Title Independent with balance HEP or other advanced HEP as indicated by 06/04/16   Status On-going     PT LONG TERM GOAL #2   Title Gait speed >/= 2.8 ft/sec with or w/o SPC for improved safety with community ambulation by 06/04/16   Status On-going     PT LONG TERM GOAL #3   Title Berg balance >/= 48/56 to decrease risk for falls by 06/04/16   Status On-going      PT LONG TERM GOAL #4   Title FGA >/= 19/30 to improve gait stability and reduce fall risk by 06/04/16   Status New               Plan - 04/28/16 0833    Clinical Impression Statement Today's treatment focused on OTAGO HEP activity review with pt. requiring min cues for proper technique throughout.  Pt. reports performing OTAGO balance HEP every other day and strengthening HEP every day at this point.  Pt. still with tendency to navigate clinic without cane and would benefit from continued skilled instruction for proper sequencing, with SPC and stride length.  Heavy cues provided in treatment today to reinforce need to use SPC with navigation/ambulation.  Pt. still with poor LE clearance and decreased stride length on R.     PT Treatment/Interventions Patient/family education;Neuromuscular re-education;Balance training;Vestibular;Therapeutic exercise;Therapeutic activities;Functional mobility training;Gait training;ADLs/Self Care Home Management   PT Next Visit Plan Proper sequencing with SPC; Continue hip strengthening; balance training      Patient will benefit from skilled therapeutic intervention in order to improve the following deficits and impairments:  Decreased balance, Decreased coordination, Difficulty walking, Abnormal gait, Postural dysfunction, Decreased strength, Decreased activity tolerance, Decreased endurance, Impaired vision/preception, Impaired perceived functional ability, Decreased safety awareness  Visit Diagnosis: Unsteadiness on feet  Unspecified lack of coordination  Other abnormalities of gait and mobility  Difficulty in walking, not elsewhere classified     Problem List Patient Active Problem List   Diagnosis Date Noted  . Fall   . Closed left hip fracture ([place]) 10/13/2015  . Medicare annual wellness visit, subsequent 03/16/2015  . Paroxysmal atrial fibrillation ([place]) 09/26/2014  . Orthostatic hypotension 09/15/2014  . Syncope   . PAF (paroxysmal  atrial fibrillation) ([place])   . Head trauma 09/14/2014  . Alteration in anticoagulation 09/14/2014  . Tinnitus 03/21/2014  . Epigastric pain 02/01/2013  . Gait instability 01/01/2013  . Disequilibrium 04/03/2012  . Hyperglycemia 09/13/2011  . Aortic stenosis 03/10/2011  . CAD, ARTERY BYPASS GRAFT 02/23/2010  . Hyperlipidemia 07/04/2008  . Essential hypertension 07/04/2008  . Coronary atherosclerosis 07/04/2008  . RHINITIS 07/04/2008  . BENIGN PROSTATIC HYPERTROPHY, WITH OBSTRUCTION 07/04/2008    Bess Harvest, PTA 04/28/16 9:02 AM  [facility] [place], [place], [address] Phone: [phone]   Fax:  [phone]  Name: SHEQUILLE WASDIN MRN:  [id number] Date of Birth: May 14, 1929

## 2016-04-30 ENCOUNTER — Ambulatory Visit: Payer: PPO | Admitting: Physical Therapy

## 2016-04-30 DIAGNOSIS — R2681 Unsteadiness on feet: Secondary | ICD-10-CM

## 2016-04-30 DIAGNOSIS — R262 Difficulty in walking, not elsewhere classified: Secondary | ICD-10-CM

## 2016-04-30 DIAGNOSIS — R279 Unspecified lack of coordination: Secondary | ICD-10-CM

## 2016-04-30 DIAGNOSIS — R2689 Other abnormalities of gait and mobility: Secondary | ICD-10-CM

## 2016-04-30 NOTE — Therapy (Signed)
[facility] [address]  [place], [place], [address] Phone: [phone]   Fax:  [phone]  Physical Therapy Treatment  Patient Details  Name: Justin [name] MRN: [id number] Date of Birth: [birth year] Referring Provider: Ames Coupe, DO  Encounter Date: 04/30/2016      PT End of Session - 04/30/16 0800    Visit Number 4   Number of Visits 16   Date for PT Re-Evaluation 06/04/16   Authorization Type HT [employer]age (follow Medicare guidelines)   PT Start Time 0800   PT Stop Time 0846   PT Time Calculation (min) 46 min   Activity Tolerance Patient tolerated treatment well   Behavior During Therapy [facility] for tasks assessed/performed      Past Medical History:  Diagnosis Date  . Basal cell carcinoma   . BPH (benign prostatic hypertrophy)   . CAD (coronary artery disease)    a. H/o MI;  b. s/p CABG  . Disequilibrium 04/03/2012  . HTN (hypertension)   . Hyperlipidemia   . Internal hemorrhoids   . Medicare annual wellness visit, subsequent 03/16/2015   Follows with cardiology, Dr Aundra Dubin, Dr Bing Neighbors with dermatology, Dr Altamese Cabal Aged out of colonoscopy  . Moderate aortic stenosis    a. 08/2014 Echo: EF 55-60%, no rwma, Gr 1 DD, mod AS, mildly dil asc Ao, mild MR, sev dil LA.  [name] PAF (paroxysmal atrial fibrillation) ([place])    a. Dx 08/2014;  b. CHA2DS2VASc = 4 - decision made to withold [place] 2/2 unsteady gait and syncope.  . Sigmoid diverticulitis   . Syncope    a. 08/2014 - ? orthostatic    Past Surgical History:  Procedure Laterality Date  . COLONOSCOPY    . CORONARY ARTERY BYPASS GRAFT    . HIP ARTHROPLASTY Left 10/14/2015   Procedure: LEFT HIP HEMI ARTHROPLASTY;  Surgeon: [name], MD;  Location: [facility];  Service: Orthopedics;  Laterality: Left;  . SHOULDER SURGERY     x2    There were no vitals filed for this visit.      Subjective Assessment - 04/30/16 0803    Subjective Pt denies any  pain, reporting just fatigue ("tired") today.   Patient Stated Goals "Improve balance and decrease likelihood for falls"   Currently in Pain? No/denies                  [facility] Adult PT Treatment/Exercise - 04/30/16 0800      Knee/Hip Exercises: Aerobic   Nustep lvl 4 x 6'     Knee/Hip Exercises: Standing   Hip Flexion Both;10 reps;Knee straight   Hip Flexion Limitations yellow TB   Hip Abduction Both;10 reps;Knee straight   Abduction Limitations yellow TB   Hip Extension Both;10 reps;Knee straight   Extension Limitations yellow TB             Balance Exercises - 04/30/16 0800      OTAGO PROGRAM   Head Movements Standing;5 reps   Neck Movements Standing;5 reps   Back Extension Standing;5 reps   Trunk Movements Standing;5 reps   Knee Extensor 10 reps   Knee Flexor 10 reps   Hip ABductor 20 reps   Ankle Plantorflexors 20 reps, support   Ankle Dorsiflexors 20 reps, support   Knee Bends 20 reps, support   Backwards Walking Support   Sideways Walking Assistive device   Tandem Stance 10 seconds, support   Tandem Walk Support   One  Leg Stand 10 seconds, support   Heel Walking Support  deferred from HEP at this time   Toe Walk Support  deferred from HEP at this time   Sit to Stand 10 reps, no support           PT Education - 04/30/16 0846    Education provided Yes   Education Details Full OTAGO Fall Prevention materials provided wit current activities highlighted   Person(s) Educated Patient   Methods Explanation;Demonstration;Handout   Comprehension Verbalized understanding;Returned demonstration          PT Short Term Goals - 04/28/16 0832      PT SHORT TERM GOAL #1   Title Independent with initial strengthening HEP by 04/30/16   Status Achieved     PT SHORT TERM GOAL #2   Title TUG < 13.5 sec to decrease fall risk by 05/07/16   Status On-going           PT Long Term Goals - 04/12/16 1400      PT LONG TERM GOAL #1   Title Independent  with balance HEP or other advanced HEP as indicated by 06/04/16   Status On-going     PT LONG TERM GOAL #2   Title Gait speed >/= 2.8 ft/sec with or w/o SPC for improved safety with community ambulation by 06/04/16   Status On-going     PT LONG TERM GOAL #3   Title Berg balance >/= 48/56 to decrease risk for falls by 06/04/16   Status On-going     PT LONG TERM GOAL #4   Title FGA >/= 19/30 to improve gait stability and reduce fall risk by 06/04/16   Status New               Plan - 04/30/16 0805    Clinical Impression Statement Cane adjusted for proper height to promote upright posture while providing cues for proper placement and sequencing. Pt provided with full OTAGO educational packet with current relevant activities reviewed and highlighted in the packet. Yellow TB resistance added to standing hip strengthening exercises..   Rehab Potential Good   Clinical Impairments Affecting Rehab Potential L hip fracture s/p fall in April 2017 - L hip hemiarthroplasty 10/14/15, HTN, afib, syncope, orthostatic hypotension   PT Treatment/Interventions Patient/family education;Neuromuscular re-education;Balance training;Vestibular;Therapeutic exercise;Therapeutic activities;Functional mobility training;Gait training;ADLs/Self Care Home Management   PT Next Visit Plan Proper sequencing with SPC; Continue hip strengthening; balance training   Consulted and Agree with Plan of Care Patient      Patient will benefit from skilled therapeutic intervention in order to improve the following deficits and impairments:  Decreased balance, Decreased coordination, Difficulty walking, Abnormal gait, Postural dysfunction, Decreased strength, Decreased activity tolerance, Decreased endurance, Impaired vision/preception, Impaired perceived functional ability, Decreased safety awareness  Visit Diagnosis: Unsteadiness on feet  Unspecified lack of coordination  Other abnormalities of gait and  mobility  Difficulty in walking, not elsewhere classified     Problem List Patient Active Problem List   Diagnosis Date Noted  . Fall   . Closed left hip fracture ([place]) 10/13/2015  . Medicare annual wellness visit, subsequent 03/16/2015  . Paroxysmal atrial fibrillation ([place]) 09/26/2014  . Orthostatic hypotension 09/15/2014  . Syncope   . PAF (paroxysmal atrial fibrillation) ([place])   . Head trauma 09/14/2014  . Alteration in anticoagulation 09/14/2014  . Tinnitus 03/21/2014  . Epigastric pain 02/01/2013  . Gait instability 01/01/2013  . Disequilibrium 04/03/2012  . Hyperglycemia 09/13/2011  . Aortic stenosis 03/10/2011  .  CAD, ARTERY BYPASS GRAFT 02/23/2010  . Hyperlipidemia 07/04/2008  . Essential hypertension 07/04/2008  . Coronary atherosclerosis 07/04/2008  . RHINITIS 07/04/2008  . BENIGN PROSTATIC HYPERTROPHY, WITH OBSTRUCTION 07/04/2008    Percival Spanish, PT, MPT 04/30/2016, 10:56 AM  [facility] [place], [place], [address] Phone: [phone]   Fax:  [phone]  Name: AAYANSH HANCOX MRN: [id number] Date of Birth: [birth year]

## 2016-05-03 ENCOUNTER — Encounter: Payer: Self-pay | Admitting: Family Medicine

## 2016-05-03 ENCOUNTER — Ambulatory Visit (INDEPENDENT_AMBULATORY_CARE_PROVIDER_SITE_OTHER): Payer: PPO | Admitting: Family Medicine

## 2016-05-03 VITALS — BP 142/80 | HR 76 | Temp 97.9°F | Wt 146.6 lb

## 2016-05-03 DIAGNOSIS — I10 Essential (primary) hypertension: Secondary | ICD-10-CM

## 2016-05-03 DIAGNOSIS — R7309 Other abnormal glucose: Secondary | ICD-10-CM | POA: Diagnosis not present

## 2016-05-03 DIAGNOSIS — R42 Dizziness and giddiness: Secondary | ICD-10-CM | POA: Diagnosis not present

## 2016-05-03 DIAGNOSIS — I480 Paroxysmal atrial fibrillation: Secondary | ICD-10-CM | POA: Diagnosis not present

## 2016-05-03 DIAGNOSIS — I350 Nonrheumatic aortic (valve) stenosis: Secondary | ICD-10-CM

## 2016-05-03 DIAGNOSIS — R2681 Unsteadiness on feet: Secondary | ICD-10-CM

## 2016-05-03 DIAGNOSIS — R55 Syncope and collapse: Secondary | ICD-10-CM | POA: Diagnosis not present

## 2016-05-03 LAB — COMPREHENSIVE METABOLIC PANEL
ALT: 13 U/L (ref 0–53)
AST: 18 U/L (ref 0–37)
Albumin: 3.9 g/dL (ref 3.5–5.2)
Alkaline Phosphatase: 56 U/L (ref 39–117)
BILIRUBIN TOTAL: 0.6 mg/dL (ref 0.2–1.2)
BUN: 20 mg/dL (ref 6–23)
CALCIUM: 9.6 mg/dL (ref 8.4–10.5)
CO2: 29 meq/L (ref 19–32)
CREATININE: 1.03 mg/dL (ref 0.40–1.50)
Chloride: 104 mEq/L (ref 96–112)
GFR: 72.83 mL/min (ref >60.00)
GLUCOSE: 81 mg/dL (ref 70–99)
Potassium: 4.1 mEq/L (ref 3.5–5.1)
Sodium: 140 mEq/L (ref 135–145)
Total Protein: 7 g/dL (ref 6.0–8.3)

## 2016-05-03 LAB — CBC
HCT: 40.2 % (ref 39.0–52.0)
Hemoglobin: 13.4 g/dL (ref 13.0–17.0)
MCHC: 33.3 g/dL (ref 30.0–36.0)
MCV: 86.3 fl (ref 78.0–100.0)
Platelets: 358 10*3/uL (ref 150.0–400.0)
RBC: 4.66 Mil/uL (ref 4.22–5.81)
RDW: 15.9 % — AB (ref 11.5–15.5)
WBC: 6.7 10*3/uL (ref 4.0–10.5)

## 2016-05-03 LAB — HEMOGLOBIN A1C: HEMOGLOBIN A1C: 5.2 % (ref 4.6–6.5)

## 2016-05-03 LAB — TSH: TSH: 4.34 u[IU]/mL (ref 0.35–4.50)

## 2016-05-03 NOTE — Assessment & Plan Note (Signed)
In PT 

## 2016-05-03 NOTE — Assessment & Plan Note (Signed)
minimize simple carbs. Increase exercise as tolerated.  

## 2016-05-03 NOTE — Progress Notes (Signed)
Patient ID: Justin [name], male   DOB: 07-Jun-1929, 82 y.o.   MRN: [id number]   Subjective:    Patient ID: Justin [name], male    DOB: Jan 21, 1929, 82 y.o.   MRN: [id number]  Chief Complaint  Patient presents with  . Follow-up    HPI Patient is in today for a follow up. Patient c/o of "dizzy spells" wich occur intermittently. No symptoms today and they do not happen daily. He has a sense of poor equilibrium but no true spinning. No other neurologic complaints. Denies CP/palp/SOB/HA/congestion/fevers/GI or GU c/o. Taking meds as prescribed.  Past Medical History:  Diagnosis Date  . Basal cell carcinoma   . BPH (benign prostatic hypertrophy)   . CAD (coronary artery disease)    a. H/o MI;  b. s/p CABG  . Disequilibrium 04/03/2012  . HTN (hypertension)   . Hyperlipidemia   . Internal hemorrhoids   . Medicare annual wellness visit, subsequent 03/16/2015   Follows with cardiology, Dr Aundra Dubin, Dr Bing Neighbors with dermatology, Dr Altamese Cabal Aged out of colonoscopy  . Moderate aortic stenosis    a. 08/2014 Echo: EF 55-60%, no rwma, Gr 1 DD, mod AS, mildly dil asc Ao, mild MR, sev dil LA.  [name] PAF (paroxysmal atrial fibrillation) ([place])    a. Dx 08/2014;  b. CHA2DS2VASc = 4 - decision made to withold [place] 2/2 unsteady gait and syncope.  . Sigmoid diverticulitis   . Syncope    a. 08/2014 - ? orthostatic    Past Surgical History:  Procedure Laterality Date  . COLONOSCOPY    . CORONARY ARTERY BYPASS GRAFT    . HIP ARTHROPLASTY Left 10/14/2015   Procedure: LEFT HIP HEMI ARTHROPLASTY;  Surgeon: [name], MD;  Location: [facility];  Service: Orthopedics;  Laterality: Left;  . SHOULDER SURGERY     x2    Family History  Problem Relation Age of Onset  . Coronary artery disease Father   . Hyperlipidemia Father   . Heart attack Father   . Alcohol abuse Father   . Bipolar disorder Mother   . Heart disease Mother   . Alcohol abuse Brother   . Cancer Brother   . Cancer Maternal Grandfather    oral cancer, chew tobacco  . Cancer Sister   . Glaucoma Sister   . Alcohol abuse      Social History   Social History  . Marital status: Married    Spouse name: N/A  . Number of children: 2  . Years of education: N/A   Occupational History  . 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 Smoking status: Former Smoker    Packs/day: 0.50    Years: 15.00    Quit date: 06/29/1971  . Smokeless tobacco: Never Used  . Alcohol use Yes     Comment: 1-2 glasses of wine  . Drug use: No  . Sexual activity: No     Comment: lives with wife, retired English as a second language teacher, no dietary restrictions   Other Topics Concern  . Not on file   Social History Narrative  . No narrative on file    Outpatient Medications Prior to Visit  Medication Sig Dispense Refill  . finasteride (PROSCAR) 5 MG tablet Take 5 mg by mouth daily.    [name] acetaminophen (TYLENOL) 325 MG tablet Take 1-2 tablets (325-650 mg total) by mouth every 6 (six) hours as needed for mild pain or moderate pain. 40 tablet 0  . apixaban (ELIQUIS) 5  MG TABS tablet Take 1 tablet (5 mg total) by mouth 2 (two) times daily. 60 tablet 0  . CARTIA XT 120 MG 24 hr capsule TAKE ONE CAPSULE BY MOUTH ONCE DAILY 30 capsule 11  . cholecalciferol (VITAMIN D) 1000 UNITS tablet Take 1,000 Units by mouth daily.     . hydrochlorothiazide (MICROZIDE) 12.5 MG capsule Take 1 capsule (12.5 mg total) by mouth daily. (Patient taking differently: Take 12.5 mg by mouth daily as needed. ) 90 capsule 3  . lovastatin (MEVACOR) 20 MG tablet TAKE ONE TABLET BY MOUTH ONCE DAILY AT BEDTIME 90 tablet 3  . Multiple Vitamin (MULTIVITAMIN) capsule Take 0.5 capsules by mouth daily.     . multivitamin-lutein (OCUVITE-LUTEIN) CAPS Take 1 capsule by mouth daily.     . Probiotic Product (PROBIOTIC DAILY PO) Take 1 capsule by mouth daily.     No facility-administered medications prior to visit.     Allergies  Allergen Reactions  . Toradol  [Ketorolac Tromethamine] Other (See Comments)    CNS-AMS    Review of Systems  Constitutional: Positive for malaise/fatigue. Negative for fever.  Eyes: Negative for blurred vision.  Respiratory: Negative for cough and shortness of breath.   Cardiovascular: Negative for chest pain and palpitations.  Gastrointestinal: Negative for vomiting.  Musculoskeletal: Positive for falls and myalgias. Negative for back pain.  Skin: Negative for rash.  Neurological: Positive for loss of consciousness and weakness. Negative for headaches.       Objective:    Physical Exam  Constitutional: He is oriented to person, place, and time. He appears well-developed and well-nourished. No distress.  HENT:  Head: Normocephalic and atraumatic.  Nose: Nose normal.  Eyes: Conjunctivae are normal. Right eye exhibits no discharge. Left eye exhibits no discharge.  Neck: Normal range of motion. Neck supple. No thyromegaly present.  Cardiovascular: Normal rate.   Murmur heard. 2-3 systolic murmur  Pulmonary/Chest: Effort normal and breath sounds normal. He has no wheezes.  Abdominal: Soft. Bowel sounds are normal. There is no tenderness.  Musculoskeletal: Normal range of motion. He exhibits no edema or deformity.  Neurological: He is alert and oriented to person, place, and time.  Skin: Skin is warm and dry. He is not diaphoretic.  Psychiatric: He has a normal mood and affect.  Nursing note and vitals reviewed.   BP (!) 142/80 (BP Location: Left Arm, Patient Position: Sitting, Cuff Size: Normal)   Pulse 76   Temp 97.9 F (36.6 C) (Oral)   Wt 146 lb 9.6 oz (66.5 kg)   SpO2 97%   BMI 24.40 kg/m  Wt Readings from Last 3 Encounters:  05/07/16 148 lb (67.1 kg)  05/03/16 146 lb 9.6 oz (66.5 kg)  03/23/16 141 lb (64 kg)     Lab Results  Component Value Date   WBC 6.7 05/03/2016   HGB 13.4 05/03/2016   HCT 40.2 05/03/2016   PLT 358.0 05/03/2016   GLUCOSE 81 05/03/2016   CHOL 178 01/19/2016   TRIG  81 01/19/2016   HDL 102 01/19/2016   LDLCALC 60 01/19/2016   ALT 13 05/03/2016   AST 18 05/03/2016   NA 140 05/03/2016   K 4.1 05/03/2016   CL 104 05/03/2016   CREATININE 1.03 05/03/2016   BUN 20 05/03/2016   CO2 29 05/03/2016   TSH 4.34 05/03/2016   PSA 10.24 (H) 02/06/2009   INR 1.35 10/13/2015   HGBA1C 5.2 05/03/2016    Lab Results  Component Value Date   TSH 4.34  05/03/2016   Lab Results  Component Value Date   WBC 6.7 05/03/2016   HGB 13.4 05/03/2016   HCT 40.2 05/03/2016   MCV 86.3 05/03/2016   PLT 358.0 05/03/2016   Lab Results  Component Value Date   NA 140 05/03/2016   K 4.1 05/03/2016   CO2 29 05/03/2016   GLUCOSE 81 05/03/2016   BUN 20 05/03/2016   CREATININE 1.03 05/03/2016   BILITOT 0.6 05/03/2016   ALKPHOS 56 05/03/2016   AST 18 05/03/2016   ALT 13 05/03/2016   PROT 7.0 05/03/2016   ALBUMIN 3.9 05/03/2016   CALCIUM 9.6 05/03/2016   ANIONGAP 10 10/16/2015   GFR 72.83 05/03/2016   Lab Results  Component Value Date   CHOL 178 01/19/2016   Lab Results  Component Value Date   HDL 102 01/19/2016   Lab Results  Component Value Date   LDLCALC 60 01/19/2016   Lab Results  Component Value Date   TRIG 81 01/19/2016   Lab Results  Component Value Date   CHOLHDL 1.7 01/19/2016   Lab Results  Component Value Date   HGBA1C 5.2 05/03/2016       Assessment & Plan:   Problem List Items Addressed This Visit    Essential hypertension    Well controlled, no changes to meds. Encouraged heart healthy diet such as the DASH diet and exercise as tolerated.       Relevant Orders   TSH (Completed)   Comp Met (CMET) (Completed)   CBC (Completed)   HgB A1c (Completed)   Aortic stenosis    Recent Echo reviewed      Disequilibrium    Has an appointment with neurology later this month. Maintain adequate hydration, small frequent meals with lean proteins and complex carbs. Seek care if worsens.      Gait instability    In PT      Syncope     Last episode was in September. Now with 4 falls in 4 months. Is doing PT now for his weakness and has an appt with Neurology regarding his recurrent light headed episodes and falls. Encouraged adequate hydration and po intake.       Relevant Orders   Ambulatory referral to Cardiology   TSH (Completed)   Comp Met (CMET) (Completed)   CBC (Completed)   HgB A1c (Completed)   Paroxysmal atrial fibrillation ([place])    Tolerating Eliquis does occasionally get scant blood in stool but holds a dose of Eliquis then restarts      Relevant Orders   Ambulatory referral to Cardiology    Other Visit Diagnoses    Dizzy spells    -  Primary   Relevant Orders   Ambulatory referral to Cardiology   TSH (Completed)   Comp Met (CMET) (Completed)   CBC (Completed)   Other abnormal glucose       Relevant Orders   HgB A1c (Completed)      I have discontinued Mr. Roundy's finasteride. I am also having him maintain his multivitamin, cholecalciferol, multivitamin-lutein, Probiotic Product (PROBIOTIC DAILY PO), hydrochlorothiazide, CARTIA XT, acetaminophen, apixaban, and lovastatin.  No orders of the defined types were placed in this encounter.    [name], MD

## 2016-05-03 NOTE — Assessment & Plan Note (Signed)
Tolerating Eliquis does occasionally get scant blood in stool but holds a dose of Eliquis then restarts

## 2016-05-03 NOTE — Assessment & Plan Note (Signed)
Recent Echo reviewed

## 2016-05-03 NOTE — Assessment & Plan Note (Signed)
Well controlled, no changes to meds. Encouraged heart healthy diet such as the DASH diet and exercise as tolerated.  °

## 2016-05-03 NOTE — Assessment & Plan Note (Signed)
Last episode was in September. Now with 4 falls in 4 months. Is doing PT now for his weakness and has an appt with Neurology regarding his recurrent light headed episodes and falls. Encouraged adequate hydration and po intake.

## 2016-05-03 NOTE — Progress Notes (Signed)
Pre visit review using our clinic review tool, if applicable. No additional management support is needed unless otherwise documented below in the visit note. 

## 2016-05-03 NOTE — Patient Instructions (Signed)
Fall Prevention in the Home  Falls can cause injuries and can affect people from all age groups. There are many simple things that you can do to make your home safe and to help prevent falls. WHAT CAN I DO ON THE OUTSIDE OF MY HOME?  Regularly repair the edges of walkways and driveways and fix any cracks.  Remove high doorway thresholds.  Trim any shrubbery on the main path into your home.  Use bright outdoor lighting.  Clear walkways of debris and clutter, including tools and rocks.  Regularly check that handrails are securely fastened and in good repair. Both sides of any steps should have handrails.  Install guardrails along the edges of any raised decks or porches.  Have leaves, snow, and ice cleared regularly.  Use sand or salt on walkways during winter months.  In the garage, clean up any spills right away, including grease or oil spills. WHAT CAN I DO IN THE BATHROOM?  Use night lights.  Install grab bars by the toilet and in the tub and shower. Do not use towel bars as grab bars.  Use non-skid mats or decals on the floor of the tub or shower.  If you need to sit down while you are in the shower, use a plastic, non-slip stool..  Keep the floor dry. Immediately clean up any water that spills on the floor.  Remove soap buildup in the tub or shower on a regular basis.  Attach bath mats securely with double-sided non-slip rug tape.  Remove throw rugs and other tripping hazards from the floor. WHAT CAN I DO IN THE BEDROOM?  Use night lights.  Make sure that a bedside light is easy to reach.  Do not use oversized bedding that drapes onto the floor.  Have a firm chair that has side arms to use for getting dressed.  Remove throw rugs and other tripping hazards from the floor. WHAT CAN I DO IN THE KITCHEN?   Clean up any spills right away.  Avoid walking on wet floors.  Place frequently used items in easy-to-reach places.  If you need to reach for something  above you, use a sturdy step stool that has a grab bar.  Keep electrical cables out of the way.  Do not use floor polish or wax that makes floors slippery. If you have to use wax, make sure that it is non-skid floor wax.  Remove throw rugs and other tripping hazards from the floor. WHAT CAN I DO IN THE STAIRWAYS?  Do not leave any items on the stairs.  Make sure that there are handrails on both sides of the stairs. Fix handrails that are broken or loose. Make sure that handrails are as long as the stairways.  Check any carpeting to make sure that it is firmly attached to the stairs. Fix any carpet that is loose or worn.  Avoid having throw rugs at the top or bottom of stairways, or secure the rugs with carpet tape to prevent them from moving.  Make sure that you have a light switch at the top of the stairs and the bottom of the stairs. If you do not have them, have them installed. WHAT ARE SOME OTHER FALL PREVENTION TIPS?  Wear closed-toe shoes that fit well and support your feet. Wear shoes that have rubber soles or low heels.  When you use a stepladder, make sure that it is completely opened and that the sides are firmly locked. Have someone hold the ladder while you   are using it. Do not climb a closed stepladder.  Add color or contrast paint or tape to grab bars and handrails in your home. Place contrasting color strips on the first and last steps.  Use mobility aids as needed, such as canes, walkers, scooters, and crutches.  Turn on lights if it is dark. Replace any light bulbs that burn out.  Set up furniture so that there are clear paths. Keep the furniture in the same spot.  Fix any uneven floor surfaces.  Choose a carpet design that does not hide the edge of steps of a stairway.  Be aware of any and all pets.  Review your medicines with your healthcare provider. Some medicines can cause dizziness or changes in blood pressure, which increase your risk of falling. Talk  with your health care provider about other ways that you can decrease your risk of falls. This may include working with a physical therapist or trainer to improve your strength, balance, and endurance.   This information is not intended to replace advice given to you by your health care provider. Make sure you discuss any questions you have with your health care provider.   Document Released: 06/04/2002 Document Revised: 10/29/2014 Document Reviewed: 07/19/2014 Elsevier Interactive Patient Education 2016 Elsevier Inc.  

## 2016-05-05 ENCOUNTER — Ambulatory Visit: Payer: PPO | Admitting: Physical Therapy

## 2016-05-05 DIAGNOSIS — R2689 Other abnormalities of gait and mobility: Secondary | ICD-10-CM

## 2016-05-05 DIAGNOSIS — R279 Unspecified lack of coordination: Secondary | ICD-10-CM

## 2016-05-05 DIAGNOSIS — R2681 Unsteadiness on feet: Secondary | ICD-10-CM | POA: Diagnosis not present

## 2016-05-05 DIAGNOSIS — R262 Difficulty in walking, not elsewhere classified: Secondary | ICD-10-CM

## 2016-05-05 NOTE — Therapy (Signed)
[facility] [address]  [place], [place], [address] Phone: [phone]   Fax:  [phone]  Physical Therapy Treatment  Patient Details  Name: Justin [name] MRN: [id number] Date of Birth: 15-Oct-1928 Referring Provider: Ames Coupe, DO  Encounter Date: 05/05/2016      PT End of Session - 05/05/16 0800    Visit Number 6   Number of Visits 16   Date for PT Re-Evaluation 06/04/16   Authorization Type [employer] (follow Medicare guidelines)   PT Start Time 0800   PT Stop Time 0842   PT Time Calculation (min) 42 min   Activity Tolerance Patient tolerated treatment well   Behavior During Therapy [facility] for tasks assessed/performed      Past Medical History:  Diagnosis Date  . Basal cell carcinoma   . BPH (benign prostatic hypertrophy)   . CAD (coronary artery disease)    a. H/o MI;  b. s/p CABG  . Disequilibrium 04/03/2012  . HTN (hypertension)   . Hyperlipidemia   . Internal hemorrhoids   . Medicare annual wellness visit, subsequent 03/16/2015   Follows with cardiology, Dr Aundra Dubin, Dr Bing Neighbors with dermatology, Dr Altamese Cabal Aged out of colonoscopy  . Moderate aortic stenosis    a. 08/2014 Echo: EF 55-60%, no rwma, Gr 1 DD, mod AS, mildly dil asc Ao, mild MR, sev dil LA.  [name] PAF (paroxysmal atrial fibrillation) ([place])    a. Dx 08/2014;  b. CHA2DS2VASc = 4 - decision made to withold [place] 2/2 unsteady gait and syncope.  . Sigmoid diverticulitis   . Syncope    a. 08/2014 - ? orthostatic    Past Surgical History:  Procedure Laterality Date  . COLONOSCOPY    . CORONARY ARTERY BYPASS GRAFT    . HIP ARTHROPLASTY Left 10/14/2015   Procedure: LEFT HIP HEMI ARTHROPLASTY;  Surgeon: [name], MD;  Location: [place];  Service: Orthopedics;  Laterality: Left;  . SHOULDER SURGERY     x2    There were no vitals filed for this visit.      Subjective Assessment - 05/05/16 0803    Subjective Pt states  "just feeling lazy today."   Patient Stated Goals "Improve balance and decrease likelihood for falls"   Currently in Pain? No/denies            [facility] PT Assessment - 05/05/16 0800      Timed Up and Go Test   TUG Normal TUG   Normal TUG (seconds) 9.68  w/o AD (unsteady step pattern); 10.53" w/ SPC                [facility] Adult PT Treatment/Exercise - 05/05/16 0800      Ambulation/Gait   Ambulation/Gait Assistance 5: Supervision   Ambulation/Gait Assistance Details Cues for reciprocal arm swing, increased step length and improved foot clearance with heel-toe progression - Exaggerated step pattern intially then gradually transitioned into normal gait   Ambulation Distance (Feet) 500 Feet   Assistive device None   Gait Pattern Step-to pattern;Decreased arm swing - right;Decreased arm swing - left;Decreased stride length;Trunk flexed     Standardized Balance Assessment   Standardized Balance Assessment Timed Up and Go Test     Timed Up and Go Test   TUG Normal TUG   Normal TUG (seconds) 9.68  w/o AD (shuffling step pattern); 10.53" w/ SPC      Lumbar Exercises: Standing   Other Standing Lumbar Exercises Trunk  extension + scapular retraction against 1/2 FR on wall x10; + B Shoulder horiz ABD with red TB x10     Knee/Hip Exercises: Aerobic   Nustep lvl 5 x 6'     Knee/Hip Exercises: Standing   Hip Flexion Both;15 reps;Knee straight   Hip Flexion Limitations yellow TB looped around ankles   Hip Abduction Both;15 reps;Knee straight   Abduction Limitations yellow TB   Hip Extension Both;15 reps;Knee straight   Extension Limitations yellow TB           PWR ([facility]) - 05/05/16 0800    PWR! Step Through Forward/Back x10 with TRX support               PT Short Term Goals - 05/05/16 0804      PT SHORT TERM GOAL #1   Title Independent with initial strengthening HEP by 04/30/16   Status Achieved     PT SHORT TERM GOAL #2   Title TUG < 13.5 sec to decrease fall  risk by 05/07/16   Status Achieved           PT Long Term Goals - 04/12/16 1400      PT LONG TERM GOAL #1   Title Independent with balance HEP or other advanced HEP as indicated by 06/04/16   Status On-going     PT LONG TERM GOAL #2   Title Gait speed >/= 2.8 ft/sec with or w/o SPC for improved safety with community ambulation by 06/04/16   Status On-going     PT LONG TERM GOAL #3   Title Berg balance >/= 48/56 to decrease risk for falls by 06/04/16   Status On-going     PT LONG TERM GOAL #4   Title FGA >/= 19/30 to improve gait stability and reduce fall risk by 06/04/16   Status New               Plan - 05/05/16 0804    Clinical Impression Statement TUG time improved but pt demonstrating shuffling gait pattern while performing TUG both with and w/o SPC, therefore targeted increased stride length and reciprocal arm swing to reduce shuffling gait and decrease risk for falls. Also worked on trunk extension for improved upright posture for improved balance and stability.   Rehab Potential Good   Clinical Impairments Affecting Rehab Potential L hip fracture s/p fall in April 2017 - L hip hemiarthroplasty 10/14/15, HTN, afib, syncope, orthostatic hypotension   PT Treatment/Interventions Patient/family education;Neuromuscular re-education;Balance training;Vestibular;Therapeutic exercise;Therapeutic activities;Functional mobility training;Gait training;ADLs/Self Care Home Management   PT Next Visit Plan Gait training to normalize gait pattern; Continue hip strengthening; balance training   Consulted and Agree with Plan of Care Patient      Patient will benefit from skilled therapeutic intervention in order to improve the following deficits and impairments:  Decreased balance, Decreased coordination, Difficulty walking, Abnormal gait, Postural dysfunction, Decreased strength, Decreased activity tolerance, Decreased endurance, Impaired vision/preception, Impaired perceived functional  ability, Decreased safety awareness  Visit Diagnosis: Unsteadiness on feet  Unspecified lack of coordination  Other abnormalities of gait and mobility  Difficulty in walking, not elsewhere classified     Problem List Patient Active Problem List   Diagnosis Date Noted  . Fall   . Closed left hip fracture ([place]) 10/13/2015  . Medicare annual wellness visit, subsequent 03/16/2015  . Paroxysmal atrial fibrillation ([place]) 09/26/2014  . Orthostatic hypotension 09/15/2014  . Syncope   . Head trauma 09/14/2014  . Alteration in anticoagulation 09/14/2014  .  Tinnitus 03/21/2014  . Epigastric pain 02/01/2013  . Gait instability 01/01/2013  . Disequilibrium 04/03/2012  . Hyperglycemia 09/13/2011  . Aortic stenosis 03/10/2011  . CAD, ARTERY BYPASS GRAFT 02/23/2010  . Hyperlipidemia 07/04/2008  . Essential hypertension 07/04/2008  . Coronary atherosclerosis 07/04/2008  . RHINITIS 07/04/2008  . BENIGN PROSTATIC HYPERTROPHY, WITH OBSTRUCTION 07/04/2008    Percival Spanish, PT, MPT 05/05/2016, 10:43 AM  [facility] [place], [place], [address] Phone: [phone]   Fax:  [phone]  Name: Justin [name] MRN: [id number] Date of Birth: 09/20/1928

## 2016-05-07 ENCOUNTER — Ambulatory Visit (INDEPENDENT_AMBULATORY_CARE_PROVIDER_SITE_OTHER): Payer: PPO | Admitting: Cardiology

## 2016-05-07 ENCOUNTER — Encounter: Payer: Self-pay | Admitting: Cardiology

## 2016-05-07 ENCOUNTER — Ambulatory Visit: Payer: PPO | Admitting: Physical Therapy

## 2016-05-07 VITALS — BP 142/78 | HR 72 | Ht 65.0 in | Wt 148.0 lb

## 2016-05-07 DIAGNOSIS — I2510 Atherosclerotic heart disease of native coronary artery without angina pectoris: Secondary | ICD-10-CM | POA: Diagnosis not present

## 2016-05-07 DIAGNOSIS — E7800 Pure hypercholesterolemia, unspecified: Secondary | ICD-10-CM | POA: Diagnosis not present

## 2016-05-07 DIAGNOSIS — R279 Unspecified lack of coordination: Secondary | ICD-10-CM

## 2016-05-07 DIAGNOSIS — R2681 Unsteadiness on feet: Secondary | ICD-10-CM

## 2016-05-07 DIAGNOSIS — I480 Paroxysmal atrial fibrillation: Secondary | ICD-10-CM

## 2016-05-07 DIAGNOSIS — R262 Difficulty in walking, not elsewhere classified: Secondary | ICD-10-CM

## 2016-05-07 DIAGNOSIS — I10 Essential (primary) hypertension: Secondary | ICD-10-CM | POA: Diagnosis not present

## 2016-05-07 DIAGNOSIS — R2689 Other abnormalities of gait and mobility: Secondary | ICD-10-CM

## 2016-05-07 NOTE — Therapy (Signed)
[facility] [address]  [place], [place], [address] Phone: [phone]   Fax:  [phone]  Physical Therapy Treatment  Patient Details  Name: Justin [name] MRN: [id number] Date of Birth: 07/15/1928 Referring Provider: Ames Coupe, DO  Encounter Date: 05/07/2016      PT End of Session - 05/07/16 0800    Visit Number 7   Number of Visits 16   Date for PT Re-Evaluation 06/04/16   Authorization Type [employer] (follow Medicare guidelines)   PT Start Time 0800   PT Stop Time 0841   PT Time Calculation (min) 41 min   Activity Tolerance Patient tolerated treatment well   Behavior During Therapy [facility] for tasks assessed/performed      Past Medical History:  Diagnosis Date  . Basal cell carcinoma   . BPH (benign prostatic hypertrophy)   . CAD (coronary artery disease)    a. H/o MI;  b. s/p CABG  . Disequilibrium 04/03/2012  . HTN (hypertension)   . Hyperlipidemia   . Internal hemorrhoids   . Medicare annual wellness visit, subsequent 03/16/2015   Follows with cardiology, Dr Aundra Dubin, Dr Bing Neighbors with dermatology, Dr Altamese Cabal Aged out of colonoscopy  . Moderate aortic stenosis    a. 08/2014 Echo: EF 55-60%, no rwma, Gr 1 DD, mod AS, mildly dil asc Ao, mild MR, sev dil LA.  [name] PAF (paroxysmal atrial fibrillation) ([place])    a. Dx 08/2014;  b. CHA2DS2VASc = 4 - decision made to withold [place] 2/2 unsteady gait and syncope.  . Sigmoid diverticulitis   . Syncope    a. 08/2014 - ? orthostatic    Past Surgical History:  Procedure Laterality Date  . COLONOSCOPY    . CORONARY ARTERY BYPASS GRAFT    . HIP ARTHROPLASTY Left 10/14/2015   Procedure: LEFT HIP HEMI ARTHROPLASTY;  Surgeon: [name], MD;  Location: [place];  Service: Orthopedics;  Laterality: Left;  . SHOULDER SURGERY     x2    There were no vitals filed for this visit.      Subjective Assessment - 05/07/16 0801    Subjective Pt denies any  pain today but does note some stiffness. Reports he has been trying to work on the [employer] exercises.   Patient Stated Goals "Improve balance and decrease likelihood for falls"   Currently in Pain? No/denies                [facility] Adult PT Treatment/Exercise - 05/07/16 0800      High Level Balance   High Level Balance Activities Side stepping;Braiding;Backward walking;Tandem walking     Lumbar Exercises: Standing   Other Standing Lumbar Exercises Trunk extension + scapular retraction against inside of doorframe x10; + B Shoulder horiz ABD (arms low) with red TB x10     Knee/Hip Exercises: Aerobic   Nustep lvl 6 x 6'           PWR [facility]) - 05/07/16 0800    PWR! exercises Moves in sitting   PWR! Up x10  standing at back of chair   PWR! Rock x10   PWR! Twist x10   PWR Step x10   PWR! Sit to Stand x10          Balance Exercises - 05/07/16 0800      Balance Exercises: Standing   Tandem Gait Forward;Upper extremity support  20 ft   Retro Gait Upper extremity support  20 ft  Sidestepping --  30 ft             PT Short Term Goals - 05/05/16 0804      PT SHORT TERM GOAL #1   Title Independent with initial strengthening HEP by 04/30/16   Status Achieved     PT SHORT TERM GOAL #2   Title TUG < 13.5 sec to decrease fall risk by 05/07/16   Status Achieved           PT Long Term Goals - 05/07/16 0805      PT LONG TERM GOAL #1   Title Independent with balance HEP or other advanced HEP as indicated by 06/04/16   Status On-going     PT LONG TERM GOAL #2   Title Gait speed >/= 2.8 ft/sec with or w/o SPC for improved safety with community ambulation by 06/04/16   Status On-going     PT LONG TERM GOAL #3   Title Berg balance >/= 48/56 to decrease risk for falls by 06/04/16   Status On-going     PT LONG TERM GOAL #4   Title FGA >/= 19/30 to improve gait stability and reduce fall risk by 06/04/16   Status On-going               Plan - 05/07/16  0805    Clinical Impression Statement Pt walking into therapy with fwd flexed shuffling gait but able to increase step length and foot clearance along with improved posture and increased reciprocal arm swing with cues. Attempted to carryover posture and step clearance into dynamic gait activities but requiring support of PT for most activities. Introduced standing PWR! Moves to promote improve posture and weight shift with pt able to pick up eah pattern after a few reps.   Rehab Potential Good   Clinical Impairments Affecting Rehab Potential L hip fracture s/p fall in April 2017 - L hip hemiarthroplasty 10/14/15, HTN, afib, syncope, orthostatic hypotension   PT Treatment/Interventions Patient/family education;Neuromuscular re-education;Balance training;Vestibular;Therapeutic exercise;Therapeutic activities;Functional mobility training;Gait training;ADLs/Self Care Home Management   PT Next Visit Plan Gait training to normalize gait pattern; Continue hip strengthening; balance training   Consulted and Agree with Plan of Care Patient      Patient will benefit from skilled therapeutic intervention in order to improve the following deficits and impairments:  Decreased balance, Decreased coordination, Difficulty walking, Abnormal gait, Postural dysfunction, Decreased strength, Decreased activity tolerance, Decreased endurance, Impaired vision/preception, Impaired perceived functional ability, Decreased safety awareness  Visit Diagnosis: Unsteadiness on feet  Unspecified lack of coordination  Other abnormalities of gait and mobility  Difficulty in walking, not elsewhere classified     Problem List Patient Active Problem List   Diagnosis Date Noted  . Fall   . Closed left hip fracture ([place]) 10/13/2015  . Medicare annual wellness visit, subsequent 03/16/2015  . Paroxysmal atrial fibrillation ([place]) 09/26/2014  . Orthostatic hypotension 09/15/2014  . Syncope   . Head trauma 09/14/2014  .  Alteration in anticoagulation 09/14/2014  . Tinnitus 03/21/2014  . Epigastric pain 02/01/2013  . Gait instability 01/01/2013  . Disequilibrium 04/03/2012  . Hyperglycemia 09/13/2011  . Aortic stenosis 03/10/2011  . CAD, ARTERY BYPASS GRAFT 02/23/2010  . Hyperlipidemia 07/04/2008  . Essential hypertension 07/04/2008  . Coronary atherosclerosis 07/04/2008  . RHINITIS 07/04/2008  . BENIGN PROSTATIC HYPERTROPHY, WITH OBSTRUCTION 07/04/2008    Percival Spanish, PT, MPT 05/07/2016, 8:46 AM  [facility] [place]  [place], [place], [address] Phone: [phone]   Fax:  [phone]  Name: Justin [name] MRN: [id number] Date of Birth: 04/26/1929

## 2016-05-07 NOTE — Progress Notes (Signed)
HPI: FU CAD s/p CABG (2001) and atrial fibrillation. Previously followed by Dr Aundra Dubin. Patient also has a history of paroxysmal atrial fibrillation. Monitor March 2016 showed sinus rhythm with PAF. Last echocardiogram July 2017 showed normal LV systolic function, grade 1 diastolic dysfunction, moderate aortic stenosis with mean gradient 18 mmHg and valve area 1.2 cm. There was mild left atrial enlargement and mild mitral regurgitation. ABIs 7/17 showed many vessels noncompressible. Since last seen, patient denies dyspnea, chest pain, palpitations or syncope. He has some dizziness with standing and is unsteady on his feet.  Current Outpatient Prescriptions  Medication Sig Dispense Refill  . acetaminophen (TYLENOL) 325 MG tablet Take 1-2 tablets (325-650 mg total) by mouth every 6 (six) hours as needed for mild pain or moderate pain. 40 tablet 0  . apixaban (ELIQUIS) 5 MG TABS tablet Take 1 tablet (5 mg total) by mouth 2 (two) times daily. 60 tablet 0  . CARTIA XT 120 MG 24 hr capsule TAKE ONE CAPSULE BY MOUTH ONCE DAILY 30 capsule 11  . cholecalciferol (VITAMIN D) 1000 UNITS tablet Take 1,000 Units by mouth daily.     . hydrochlorothiazide (MICROZIDE) 12.5 MG capsule Take 1 capsule (12.5 mg total) by mouth daily. (Patient taking differently: Take 12.5 mg by mouth daily as needed. ) 90 capsule 3  . lovastatin (MEVACOR) 20 MG tablet TAKE ONE TABLET BY MOUTH ONCE DAILY AT BEDTIME 90 tablet 3  . Multiple Vitamin (MULTIVITAMIN) capsule Take 0.5 capsules by mouth daily.     . multivitamin-lutein (OCUVITE-LUTEIN) CAPS Take 1 capsule by mouth daily.     . Probiotic Product (PROBIOTIC DAILY PO) Take 1 capsule by mouth daily.     No current facility-administered medications for this visit.      Past Medical History:  Diagnosis Date  . Basal cell carcinoma   . BPH (benign prostatic hypertrophy)   . CAD (coronary artery disease)    a. H/o MI;  b. s/p CABG  . Disequilibrium 04/03/2012  . HTN  (hypertension)   . Hyperlipidemia   . Internal hemorrhoids   . Medicare annual wellness visit, subsequent 03/16/2015   Follows with cardiology, Dr Aundra Dubin, Dr Bing Neighbors with dermatology, Dr Altamese Cabal Aged out of colonoscopy  . Moderate aortic stenosis    a. 08/2014 Echo: EF 55-60%, no rwma, Gr 1 DD, mod AS, mildly dil asc Ao, mild MR, sev dil LA.  [name] PAF (paroxysmal atrial fibrillation) ([place])    a. Dx 08/2014;  b. CHA2DS2VASc = 4 - decision made to withold [place] 2/2 unsteady gait and syncope.  . Sigmoid diverticulitis   . Syncope    a. 08/2014 - ? orthostatic    Past Surgical History:  Procedure Laterality Date  . COLONOSCOPY    . CORONARY ARTERY BYPASS GRAFT    . HIP ARTHROPLASTY Left 10/14/2015   Procedure: LEFT HIP HEMI ARTHROPLASTY;  Surgeon: [name], MD;  Location: [place];  Service: Orthopedics;  Laterality: Left;  . SHOULDER SURGERY     x2    Social History   Social History  . Marital status: Married    Spouse name: N/A  . Number of children: 2  . Years of education: N/A   Occupational History  . 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 Smoking status: Former Smoker    Packs/day: 0.50    Years: 15.00    Quit date: 06/29/1971  . Smokeless tobacco: Never Used  . Alcohol use  Yes     Comment: 1-2 glasses of wine  . Drug use: No  . Sexual activity: No     Comment: lives with wife, retired English as a second language teacher, no dietary restrictions   Other Topics Concern  . Not on file   Social History Narrative  . No narrative on file    Family History  Problem Relation Age of Onset  . Coronary artery disease Father   . Hyperlipidemia Father   . Heart attack Father   . Alcohol abuse Father   . Bipolar disorder Mother   . Heart disease Mother   . Alcohol abuse Brother   . Cancer Brother   . Cancer Maternal Grandfather     oral cancer, chew tobacco  . Cancer Sister   . Glaucoma Sister   . Alcohol abuse      ROS: no fevers  or chills, productive cough, hemoptysis, dysphasia, odynophagia, melena, hematochezia, dysuria, hematuria, rash, seizure activity, orthopnea, PND, pedal edema, claudication. Remaining systems are negative.  Physical Exam: Well-developed well-nourished in no acute distress.  Skin is warm and dry.  HEENT is normal.  Neck is supple.  Chest is clear to auscultation with normal expansion.  Cardiovascular exam is regular rate and rhythm. 3/6 systolic murmur left sternal border. S2 is not diminished. Abdominal exam nontender or distended. No masses palpated. Extremities show no edema. neuro grossly intact  A/P  1 coronary artery disease-continue statin. No aspirin given need for anticoagulation.  2 aortic stenosis-moderate on most recent echocardiogram. He will need follow-up echoes in the future.  3 hyperlipidemia-continue statin.  4 Hypertension-blood pressure controlled. Continue present medications.  5 paroxysmal atrial fibrillation-continue Cardizem for rate control if atrial fibrillation recurs. Continue apixaban.  6 peripheral vascular disease-continue statin.  7 dizziness-patient has some dizziness with standing which sounds to be orthostatic. I have encouraged him to stay hydrated. I have asked him to take his cane with him when he walks. He has not had syncope.  Kirk Ruths, MD

## 2016-05-07 NOTE — Patient Instructions (Signed)
Your physician recommends that you schedule a follow-up appointment in: [facility]

## 2016-05-09 NOTE — Assessment & Plan Note (Signed)
Has an appointment with neurology later this month. Maintain adequate hydration, small frequent meals with lean proteins and complex carbs. Seek care if worsens.

## 2016-05-12 ENCOUNTER — Ambulatory Visit: Payer: PPO

## 2016-05-12 DIAGNOSIS — R2689 Other abnormalities of gait and mobility: Secondary | ICD-10-CM

## 2016-05-12 DIAGNOSIS — R279 Unspecified lack of coordination: Secondary | ICD-10-CM

## 2016-05-12 DIAGNOSIS — R2681 Unsteadiness on feet: Secondary | ICD-10-CM | POA: Diagnosis not present

## 2016-05-12 DIAGNOSIS — R262 Difficulty in walking, not elsewhere classified: Secondary | ICD-10-CM

## 2016-05-12 NOTE — Therapy (Signed)
[facility] [address]  [place], [place], [address] Phone: [phone]   Fax:  [phone]  Physical Therapy Evaluation  Patient Details  Name: Justin [name] MRN: [id number] Date of Birth: 24-Dec-1928 Referring Provider: Ames Coupe, DO  Encounter Date: 05/12/2016      PT End of Session - 05/12/16 0805    Visit Number 8   Number of Visits 16   Date for PT Re-Evaluation 06/04/16   Authorization Type HT Advantage (follow Medicare guidelines)   PT Start Time 0800   PT Stop Time 0840   PT Time Calculation (min) 40 min   Activity Tolerance Patient tolerated treatment well   Behavior During Therapy [facility] for tasks assessed/performed      Past Medical History:  Diagnosis Date  . Basal cell carcinoma   . BPH (benign prostatic hypertrophy)   . CAD (coronary artery disease)    a. H/o MI;  b. s/p CABG  . Disequilibrium 04/03/2012  . HTN (hypertension)   . Hyperlipidemia   . Internal hemorrhoids   . Medicare annual wellness visit, subsequent 03/16/2015   Follows with cardiology, Dr Aundra Dubin, Dr Bing Neighbors with dermatology, Dr Altamese Cabal Aged out of colonoscopy  . Moderate aortic stenosis    a. 08/2014 Echo: EF 55-60%, no rwma, Gr 1 DD, mod AS, mildly dil asc Ao, mild MR, sev dil LA.  [name] PAF (paroxysmal atrial fibrillation) ([place])    a. Dx 08/2014;  b. CHA2DS2VASc = 4 - decision made to withold [place] 2/2 unsteady gait and syncope.  . Sigmoid diverticulitis   . Syncope    a. 08/2014 - ? orthostatic    Past Surgical History:  Procedure Laterality Date  . COLONOSCOPY    . CORONARY ARTERY BYPASS GRAFT    . HIP ARTHROPLASTY Left 10/14/2015   Procedure: LEFT HIP HEMI ARTHROPLASTY;  Surgeon: [name], MD;  Location: [facility];  Service: Orthopedics;  Laterality: Left;  . SHOULDER SURGERY     x2    There were no vitals filed for this visit.       Subjective Assessment - 05/12/16 0803    Subjective Pt.  reports he feels he is making progress with therapy, "slowly but surely".    Patient Stated Goals "Improve balance and decrease likelihood for falls"   Currently in Pain? No/denies   Multiple Pain Sites No            [facility] Adult PT Treatment/Exercise - 05/12/16 [id number]      Ambulation/Gait   Ambulation/Gait Assistance 5: Supervision   Ambulation/Gait Assistance Details Cues for reciprocal arm swing, increased step length, and improved foot clearance - 2 trials each x 250 ft with exaggerated arm swing with and without SPC, and exaggerated step length with and without SPC; close supervision provided to pt. throughout gait training; Pt. able to correct each deviation when consciously trying however quick to revert back to shortened step length and reduced arm swing with distractions/conversations.     Ambulation Distance (Feet) 1000 Feet   Assistive device Straight cane   Gait Pattern Decreased arm swing - right;Decreased arm swing - left;Decreased stride length;Trunk flexed;Step-through pattern   Gait Comments verbal cues provided for proper sequencing, increased stride length on R LE, and SPC placement closer to BOS     Lumbar Exercises: Standing   Other Standing Lumbar Exercises Trunk extension + scapular retraction with green TB row x 20 reps 3" hold  Knee/Hip Exercises: Aerobic   Nustep lvl 6 x 6'     Knee/Hip Exercises: Standing   Heel Raises Both;20 reps;2 seconds;2 sets   Hip Flexion Both;Knee straight;20 reps   Hip Flexion Limitations yellow TB looped around ankles   Hip Abduction Both;15 reps;Knee straight;2 sets   Abduction Limitations yellow TB   Hip Extension Both;Knee straight;20 reps   Extension Limitations yellow TB                  PT Short Term Goals - 05/05/16 0804      PT SHORT TERM GOAL #1   Title Independent with initial strengthening HEP by 04/30/16   Status Achieved     PT SHORT TERM GOAL #2   Title TUG < 13.5 sec to decrease fall risk by  05/07/16   Status Achieved           PT Long Term Goals - 05/07/16 0805      PT LONG TERM GOAL #1   Title Independent with balance HEP or other advanced HEP as indicated by 06/04/16   Status On-going     PT LONG TERM GOAL #2   Title Gait speed >/= 2.8 ft/sec with or w/o SPC for improved safety with community ambulation by 06/04/16   Status On-going     PT LONG TERM GOAL #3   Title Berg balance >/= 48/56 to decrease risk for falls by 06/04/16   Status On-going     PT LONG TERM GOAL #4   Title FGA >/= 19/30 to improve gait stability and reduce fall risk by 06/04/16   Status On-going               Plan - 05/12/16 0807    Clinical Impression Statement Pt. responding very well to gait training today able to correct gait deviations of shortened step length and improve reciprocal arm swing however with limited carry-over to rest of treatment.  Pt. still with lateral instability and altered walking path side<>side ambulating without SPC.  Standing hip strengthening with focus on hip extension, abduction strengthening with mild progression today.  Pt. able to complete all therex without pain.  Pt. admitting to limited compliance with OTAGO balance HEP at this point reporting he, "gets through them all once every other day".  Pt. instructed to perform HEP as instructed for improved balance and benefit from therapy.    PT Treatment/Interventions Patient/family education;Neuromuscular re-education;Balance training;Vestibular;Therapeutic exercise;Therapeutic activities;Functional mobility training;Gait training;ADLs/Self Care Home Management   PT Next Visit Plan Gait training to normalize gait pattern; Continue hip strengthening; balance training      Patient will benefit from skilled therapeutic intervention in order to improve the following deficits and impairments:  Decreased balance, Decreased coordination, Difficulty walking, Abnormal gait, Postural dysfunction, Decreased strength,  Decreased activity tolerance, Decreased endurance, Impaired vision/preception, Impaired perceived functional ability, Decreased safety awareness  Visit Diagnosis: Unsteadiness on feet  Unspecified lack of coordination  Other abnormalities of gait and mobility  Difficulty in walking, not elsewhere classified     Problem List Patient Active Problem List   Diagnosis Date Noted  . Fall   . Closed left hip fracture ([place]) 10/13/2015  . Medicare annual wellness visit, subsequent 03/16/2015  . Paroxysmal atrial fibrillation ([place]) 09/26/2014  . Orthostatic hypotension 09/15/2014  . Syncope   . Head trauma 09/14/2014  . Alteration in anticoagulation 09/14/2014  . Tinnitus 03/21/2014  . Epigastric pain 02/01/2013  . Gait instability 01/01/2013  . Disequilibrium 04/03/2012  . Hyperglycemia  09/13/2011  . Aortic stenosis 03/10/2011  . CAD, ARTERY BYPASS GRAFT 02/23/2010  . Hyperlipidemia 07/04/2008  . Essential hypertension 07/04/2008  . Coronary atherosclerosis 07/04/2008  . RHINITIS 07/04/2008  . BENIGN PROSTATIC HYPERTROPHY, WITH OBSTRUCTION 07/04/2008    Bess Harvest, PTA 05/12/16 11:39 AM  [facility] [place], [place], [address] Phone: [phone]   Fax:  [phone]  Name: SOUMIL DOWTY MRN: [id number] Date of Birth: 10/29/1928

## 2016-05-14 ENCOUNTER — Ambulatory Visit: Payer: PPO | Admitting: Physical Therapy

## 2016-05-14 ENCOUNTER — Encounter (HOSPITAL_BASED_OUTPATIENT_CLINIC_OR_DEPARTMENT_OTHER): Payer: Self-pay | Admitting: Emergency Medicine

## 2016-05-14 ENCOUNTER — Emergency Department (HOSPITAL_BASED_OUTPATIENT_CLINIC_OR_DEPARTMENT_OTHER)
Admission: EM | Admit: 2016-05-14 | Discharge: 2016-05-14 | Disposition: A | Discharge status: Home / Self Care | Payer: PPO | Attending: Emergency Medicine | Admitting: Emergency Medicine

## 2016-05-14 DIAGNOSIS — S59901A Unspecified injury of right elbow, initial encounter: Secondary | ICD-10-CM | POA: Diagnosis not present

## 2016-05-14 DIAGNOSIS — I2510 Atherosclerotic heart disease of native coronary artery without angina pectoris: Secondary | ICD-10-CM | POA: Insufficient documentation

## 2016-05-14 DIAGNOSIS — I10 Essential (primary) hypertension: Secondary | ICD-10-CM | POA: Insufficient documentation

## 2016-05-14 DIAGNOSIS — S50311A Abrasion of right elbow, initial encounter: Secondary | ICD-10-CM | POA: Diagnosis not present

## 2016-05-14 DIAGNOSIS — Z87891 Personal history of nicotine dependence: Secondary | ICD-10-CM | POA: Insufficient documentation

## 2016-05-14 DIAGNOSIS — Z85828 Personal history of other malignant neoplasm of skin: Secondary | ICD-10-CM | POA: Insufficient documentation

## 2016-05-14 DIAGNOSIS — W19XXXA Unspecified fall, initial encounter: Secondary | ICD-10-CM | POA: Diagnosis not present

## 2016-05-14 DIAGNOSIS — Z951 Presence of aortocoronary bypass graft: Secondary | ICD-10-CM | POA: Insufficient documentation

## 2016-05-14 DIAGNOSIS — Y92012 Bathroom of single-family (private) house as the place of occurrence of the external cause: Secondary | ICD-10-CM | POA: Insufficient documentation

## 2016-05-14 DIAGNOSIS — Y9389 Activity, other specified: Secondary | ICD-10-CM | POA: Diagnosis not present

## 2016-05-14 DIAGNOSIS — Y999 Unspecified external cause status: Secondary | ICD-10-CM | POA: Diagnosis not present

## 2016-05-14 LAB — CBC
HCT: 38.1 % — ABNORMAL LOW (ref 39.0–52.0)
HEMOGLOBIN: 12.8 g/dL — AB (ref 13.0–17.0)
MCH: 28.6 pg (ref 26.0–34.0)
MCHC: 33.6 g/dL (ref 30.0–36.0)
MCV: 85.2 fL (ref 78.0–100.0)
Platelets: 333 10*3/uL (ref 150–400)
RBC: 4.47 MIL/uL (ref 4.22–5.81)
RDW: 15.1 % (ref 11.5–15.5)
WBC: 5.7 10*3/uL (ref 4.0–10.5)

## 2016-05-14 LAB — BASIC METABOLIC PANEL
ANION GAP: 5 (ref 5–15)
BUN: 14 mg/dL (ref 6–20)
CALCIUM: 9.2 mg/dL (ref 8.9–10.3)
CO2: 29 mmol/L (ref 22–32)
Chloride: 103 mmol/L (ref 101–111)
Creatinine, Ser: 0.91 mg/dL (ref 0.61–1.24)
GLUCOSE: 124 mg/dL — AB (ref 65–99)
Potassium: 3.7 mmol/L (ref 3.5–5.1)
SODIUM: 137 mmol/L (ref 135–145)

## 2016-05-14 NOTE — ED Provider Notes (Signed)
[facility] DEPT [facility] Provider Note   CSN: [id number] Arrival date & time: 05/14/16  0744     History   Chief Complaint Chief Complaint  Patient presents with  . Weakness    HPI Justin [name] is a 82 y.o. male.  HPI Presenting today with weakness. Reports he got up around 3am to go to the bathroom and his knees gave out 3-4 times. Did not lose consciousness, but notes some generalized weakness. Hit his right elbow and left side with fall, but denies head trauma. Did not feel confused at time of fall. Denies shortness of breath, chest pain, or palpitations. States home monitor told him he was in atrial fibrillation at time of fall. Reports he has an appointment for evaluation by Neurology on November 28 to evaluate falls and weakness. Was evaluated by Cardiology on 05/07/16 and they did not feel that falls are cardiac related. Feels better and more at baseline   Follows with outpatient rehab for gait training to help decrease number of falls, last seen on 05/12/16. Also with history of atrial fibrillation, followed by Cardiology. Some dizziness with standing and unsteadiness noted at their last office visit on 05/07/16; they recommended staying well hydrated and ambulating with cane. Seen by PCP on 05/03/16 for dizzy spells--referred to Neurology. Noted to have had 4 falls in 4 months.  CT Scan on 03/23/16 with chronic stable small vessel ischemic disease without acute abnormality.  Past Medical History:  Diagnosis Date  . Basal cell carcinoma   . BPH (benign prostatic hypertrophy)   . CAD (coronary artery disease)    a. H/o MI;  b. s/p CABG  . Disequilibrium 04/03/2012  . HTN (hypertension)   . Hyperlipidemia   . Internal hemorrhoids   . Medicare annual wellness visit, subsequent 03/16/2015   Follows with cardiology, Dr Aundra Dubin, Dr Bing Neighbors with dermatology, Dr Altamese Cabal Aged out of colonoscopy  . Moderate aortic stenosis    a. 08/2014 Echo: EF 55-60%, no rwma, Gr 1 DD, mod  AS, mildly dil asc Ao, mild MR, sev dil LA.  [name] PAF (paroxysmal atrial fibrillation) ([place])    a. Dx 08/2014;  b. CHA2DS2VASc = 4 - decision made to withold [place] 2/2 unsteady gait and syncope.  . Sigmoid diverticulitis   . Syncope    a. 08/2014 - ? orthostatic    Patient Active Problem List   Diagnosis Date Noted  . Fall   . Closed left hip fracture ([place]) 10/13/2015  . Medicare annual wellness visit, subsequent 03/16/2015  . Paroxysmal atrial fibrillation ([place]) 09/26/2014  . Orthostatic hypotension 09/15/2014  . Syncope   . Head trauma 09/14/2014  . Alteration in anticoagulation 09/14/2014  . Tinnitus 03/21/2014  . Epigastric pain 02/01/2013  . Gait instability 01/01/2013  . Disequilibrium 04/03/2012  . Hyperglycemia 09/13/2011  . Aortic stenosis 03/10/2011  . CAD, ARTERY BYPASS GRAFT 02/23/2010  . Hyperlipidemia 07/04/2008  . Essential hypertension 07/04/2008  . Coronary atherosclerosis 07/04/2008  . RHINITIS 07/04/2008  . BENIGN PROSTATIC HYPERTROPHY, WITH OBSTRUCTION 07/04/2008    Past Surgical History:  Procedure Laterality Date  . COLONOSCOPY    . CORONARY ARTERY BYPASS GRAFT    . HIP ARTHROPLASTY Left 10/14/2015   Procedure: LEFT HIP HEMI ARTHROPLASTY;  Surgeon: [name], MD;  Location: [facility];  Service: Orthopedics;  Laterality: Left;  . SHOULDER SURGERY     x2       Home Medications    Prior to Admission medications   Medication Sig Start  Date End Date Taking? Authorizing Provider  acetaminophen (TYLENOL) 325 MG tablet Take 1-2 tablets (325-650 mg total) by mouth every 6 (six) hours as needed for mild pain or moderate pain. 10/15/15   Loni Dolly, PA-C  apixaban (ELIQUIS) 5 MG TABS tablet Take 1 tablet (5 mg total) by mouth 2 (two) times daily. 10/15/15   Loni Dolly, PA-C  CARTIA XT 120 MG 24 hr capsule TAKE ONE CAPSULE BY MOUTH ONCE DAILY 08/13/15   Larey Dresser, MD  cholecalciferol (VITAMIN D) 1000 UNITS tablet Take 1,000 Units by mouth daily.      Historical Provider, MD  hydrochlorothiazide (MICROZIDE) 12.5 MG capsule Take 1 capsule (12.5 mg total) by mouth daily. Patient taking differently: Take 12.5 mg by mouth daily as needed.  09/20/14   Carlena Bjornstad, MD  lovastatin (MEVACOR) 20 MG tablet TAKE ONE TABLET BY MOUTH ONCE DAILY AT BEDTIME 01/20/16   Larey Dresser, MD  Multiple Vitamin (MULTIVITAMIN) capsule Take 0.5 capsules by mouth daily.     Historical Provider, MD  multivitamin-lutein [facility]) CAPS Take 1 capsule by mouth daily.     Historical Provider, MD  Probiotic Product (PROBIOTIC DAILY PO) Take 1 capsule by mouth daily.    Historical Provider, MD    Family History Family History  Problem Relation Age of Onset  . Coronary artery disease Father   . Hyperlipidemia Father   . Heart attack Father   . Alcohol abuse Father   . Bipolar disorder Mother   . Heart disease Mother   . Alcohol abuse Brother   . Cancer Brother   . Cancer Maternal Grandfather     oral cancer, chew tobacco  . Cancer Sister   . Glaucoma Sister   . Alcohol abuse      Social History Social History  Substance Use Topics  . Smoking status: Former Smoker    Packs/day: 0.50    Years: 15.00    Quit date: 06/29/1971  . Smokeless tobacco: Never Used  . Alcohol use Yes     Comment: 1-2 glasses of wine     Allergies   Toradol [ketorolac tromethamine]   Review of Systems Review of Systems  Respiratory: Negative for shortness of breath.   Cardiovascular: Negative for chest pain and palpitations.  Musculoskeletal: Negative for arthralgias and myalgias.  Skin: Positive for wound.  Neurological: Positive for weakness. Negative for numbness and headaches.  Psychiatric/Behavioral: Negative for confusion.     Physical Exam Updated Vital Signs BP 169/69 (BP Location: Right Arm)   Pulse 72   Temp 97.5 F (36.4 C) (Oral)   Resp 16   Ht 5\' 5"  (1.651 m)   Wt 67.1 kg   SpO2 97%   BMI 24.63 kg/m   Physical Exam  Constitutional: He  is oriented to person, place, and time. He appears well-developed and well-nourished. No distress.  HENT:  Head: Normocephalic and atraumatic.  Mouth/Throat: Oropharynx is clear and moist.  Eyes: EOM are normal. Pupils are equal, round, and reactive to light.  Cardiovascular: Normal rate.  Exam reveals no friction rub.   No murmur heard. Pulmonary/Chest: Effort normal. No respiratory distress. He has no wheezes.  Abdominal: Soft. He exhibits no distension. There is no tenderness.  Musculoskeletal: He exhibits no edema.  Muscle strength 5/5 in upper and lower extremities. No pain or tenderness over bilateral hips, knees, shoulders. No cervical tenderness.   Neurological: He is alert and oriented to person, place, and time. No cranial nerve deficit or  sensory deficit.  Skin: Skin is warm. No rash noted.  Abrasion on right elbow, No bruising noted   ED Treatments / Results  Labs (all labs ordered are listed, but only abnormal results are displayed) Labs Reviewed  BASIC METABOLIC PANEL - Abnormal; Notable for the following:       Result Value   Glucose, Bld 124 (*)    All other components within normal limits  CBC - Abnormal; Notable for the following:    Hemoglobin 12.8 (*)    HCT 38.1 (*)    All other components within normal limits    EKG  EKG Interpretation None      Radiology No results found.  Procedures Procedures (including critical care time)  Medications Ordered in ED Medications - No data to display   Initial Impression / Assessment and Plan / ED Course  I have reviewed the triage vital signs and the nursing notes.  Pertinent labs & imaging results that were available during my care of the patient were reviewed by me and considered in my medical decision making (see chart for details).  Clinical Course   - EKG with normal sinus rhythm - BMP normal except for mild hyperglycmia to 124 - CBC with mild anemia 12.8. Slightly decreased from 13.4 on 05/03/16.  -  Patient reports feeling at baseline.  Final Clinical Impressions(s) / ED Diagnoses   Final diagnoses:  Fall, initial encounter  No trauma except for small abrasion on right elbow noted. Suspect dehydration in the morning coupled with unsteady gait and possible vasovagal symptoms from micturation. Recommend following up with Neurology as scheduled. May consider keeping something to drink at bedside to help with dehydration in the morning. Follow up with PCP.  New Prescriptions New Prescriptions   No medications on file     [facility], DO 05/14/16 [place], MD 05/14/16 [phone]

## 2016-05-14 NOTE — ED Triage Notes (Signed)
Pt states around 3 am he got light headed and his knees gave out on him. Has happened around 3-4 times.

## 2016-05-14 NOTE — Discharge Instructions (Signed)
Please follow up with your Neurologist as scheduled. Please stay hydrated and walk with your cane to prevent further falls. Please exercise caution with ambulation in the early morning.

## 2016-05-17 ENCOUNTER — Ambulatory Visit: Payer: PPO | Admitting: Physical Therapy

## 2016-05-17 VITALS — BP 126/78 | HR 101

## 2016-05-17 DIAGNOSIS — R262 Difficulty in walking, not elsewhere classified: Secondary | ICD-10-CM

## 2016-05-17 DIAGNOSIS — R2681 Unsteadiness on feet: Secondary | ICD-10-CM | POA: Diagnosis not present

## 2016-05-17 DIAGNOSIS — R279 Unspecified lack of coordination: Secondary | ICD-10-CM

## 2016-05-17 DIAGNOSIS — R2689 Other abnormalities of gait and mobility: Secondary | ICD-10-CM

## 2016-05-17 NOTE — Therapy (Signed)
[facility] [address]  [place], [place], [address] Phone: [phone]   Fax:  [phone]  Physical Therapy Treatment  Patient Details  Name: Justin [name] MRN: [id number] Date of Birth: 1928/10/19 Referring Provider: Ames Coupe, DO  Encounter Date: 05/17/2016      PT End of Session - 05/17/16 1315    Visit Number 9   Number of Visits 16   Date for PT Re-Evaluation 06/04/16   Authorization Type HT Advantage (follow Medicare guidelines)   PT Start Time 1315   PT Stop Time 1407   PT Time Calculation (min) 52 min   Activity Tolerance Patient tolerated treatment well   Behavior During Therapy [facility] for tasks assessed/performed      Past Medical History:  Diagnosis Date  . Basal cell carcinoma   . BPH (benign prostatic hypertrophy)   . CAD (coronary artery disease)    a. H/o MI;  b. s/p CABG  . Disequilibrium 04/03/2012  . HTN (hypertension)   . Hyperlipidemia   . Internal hemorrhoids   . Medicare annual wellness visit, subsequent 03/16/2015   Follows with cardiology, Dr Aundra Dubin, Dr Bing Neighbors with dermatology, Dr Altamese Cabal Aged out of colonoscopy  . Moderate aortic stenosis    a. 08/2014 Echo: EF 55-60%, no rwma, Gr 1 DD, mod AS, mildly dil asc Ao, mild MR, sev dil LA.  [name] PAF (paroxysmal atrial fibrillation) ([place])    a. Dx 08/2014;  b. CHA2DS2VASc = 4 - decision made to withold [place] 2/2 unsteady gait and syncope.  . Sigmoid diverticulitis   . Syncope    a. 08/2014 - ? orthostatic    Past Surgical History:  Procedure Laterality Date  . COLONOSCOPY    . CORONARY ARTERY BYPASS GRAFT    . HIP ARTHROPLASTY Left 10/14/2015   Procedure: LEFT HIP HEMI ARTHROPLASTY;  Surgeon: [name], MD;  Location: [facility];  Service: Orthopedics;  Laterality: Left;  . SHOULDER SURGERY     x2    Vitals:   05/17/16 1325 05/17/16 1329 05/17/16 1332  BP: 120/70 122/80 126/78  Pulse: 92 100 (!) 101  SpO2: 96%  97% 96%        Subjective Assessment - 05/17/16 1317    Subjective Fall noted from ED admission over weekend. Pt reporting the floor "tiltled" casuing him to fall, but was able to partially catch himself of the edge of the sink. Pt admits to feeling lightheaded at the time but denies LOC. Unable to intially et up off the floor due to weakness in arms & legs, but eventually arm strength improved to where he could pull himself up from the floor. Only injury noted was abrasion to elbow.   Pertinent History L hip fracture s/p fall in April 2017 - L hip hemiarthroplasty 10/14/15   Patient Stated Goals "Improve balance and decrease likelihood for falls"   Currently in Pain? No/denies                    [facility] Adult PT Treatment/Exercise - 05/17/16 1315      Knee/Hip Exercises: Aerobic   Nustep lvl 6 x 4'  stopped early d/t fatigue             Balance Exercises - 05/17/16 1315      OTAGO PROGRAM   Head Movements Standing;5 reps   Neck Movements Standing;5 reps   Back Extension Standing;5 reps   Trunk Movements Standing;5 reps  Ankle Movements Sitting;10 reps   Knee Extensor Other reps (comment)  15 reps   Knee Flexor 10 reps   Hip ABductor 20 reps   Ankle Plantorflexors 20 reps, support   Ankle Dorsiflexors 20 reps, support   Knee Bends 20 reps, support   Backwards Walking Support   Walking and Turning Around Assistive device   Sideways Walking Assistive device   Tandem Stance 10 seconds, support   Tandem Walk Support   One Leg Stand 10 seconds, support   Sit to Stand 10 reps, no support             PT Short Term Goals - 05/05/16 0804      PT SHORT TERM GOAL #1   Title Independent with initial strengthening HEP by 04/30/16   Status Achieved     PT SHORT TERM GOAL #2   Title TUG < 13.5 sec to decrease fall risk by 05/07/16   Status Achieved           PT Long Term Goals - 05/07/16 0805      PT LONG TERM GOAL #1   Title Independent with balance  HEP or other advanced HEP as indicated by 06/04/16   Status On-going     PT LONG TERM GOAL #2   Title Gait speed >/= 2.8 ft/sec with or w/o SPC for improved safety with community ambulation by 06/04/16   Status On-going     PT LONG TERM GOAL #3   Title Berg balance >/= 48/56 to decrease risk for falls by 06/04/16   Status On-going     PT LONG TERM GOAL #4   Title FGA >/= 19/30 to improve gait stability and reduce fall risk by 06/04/16   Status On-going               Plan - 05/17/16 1321    Clinical Impression Statement Pt s/p fall over weekend (Friday 11/17) where "the floor tilted" and his "legs gave out". Reports no further issues for the remainder of the weekend and was able to do a lot of walking while shopping with his wife on Sunday. Presents to PT "feeling weaker than usual" with limited tolerance for normal Nustep workout (only 4' compared to nomral 6'). Reviewed OTAGO with new activities attempted but no changes made to HEP at this time, due to weakness and instability. VS monitored throughout treatement with HR averaging ~100 bpm with irregular rhythm d/t afib.   PT Treatment/Interventions Patient/family education;Neuromuscular re-education;Balance training;Vestibular;Therapeutic exercise;Therapeutic activities;Functional mobility training;Gait training;ADLs/Self Care Home Management   PT Next Visit Plan 10th visit FOTO & G-code; Gait training to normalize gait pattern; Continue hip strengthening; balance training   Consulted and Agree with Plan of Care Patient      Patient will benefit from skilled therapeutic intervention in order to improve the following deficits and impairments:  Decreased balance, Decreased coordination, Difficulty walking, Abnormal gait, Postural dysfunction, Decreased strength, Decreased activity tolerance, Decreased endurance, Impaired vision/preception, Impaired perceived functional ability, Decreased safety awareness  Visit Diagnosis: Unsteadiness  on feet  Unspecified lack of coordination  Other abnormalities of gait and mobility  Difficulty in walking, not elsewhere classified     Problem List Patient Active Problem List   Diagnosis Date Noted  . Fall   . Closed left hip fracture ([place]) 10/13/2015  . Medicare annual wellness visit, subsequent 03/16/2015  . Paroxysmal atrial fibrillation ([place]) 09/26/2014  . Orthostatic hypotension 09/15/2014  . Syncope   . Head trauma 09/14/2014  .  Alteration in anticoagulation 09/14/2014  . Tinnitus 03/21/2014  . Epigastric pain 02/01/2013  . Gait instability 01/01/2013  . Disequilibrium 04/03/2012  . Hyperglycemia 09/13/2011  . Aortic stenosis 03/10/2011  . CAD, ARTERY BYPASS GRAFT 02/23/2010  . Hyperlipidemia 07/04/2008  . Essential hypertension 07/04/2008  . Coronary atherosclerosis 07/04/2008  . RHINITIS 07/04/2008  . BENIGN PROSTATIC HYPERTROPHY, WITH OBSTRUCTION 07/04/2008    Percival Spanish, PT, MPT 05/17/2016, 2:16 PM  [facility] [place], [place], [address] Phone: [phone]   Fax:  [phone]  Name: Justin [name] MRN: [id number] Date of Birth: 02/25/1929

## 2016-05-19 ENCOUNTER — Ambulatory Visit: Payer: PPO | Admitting: Physical Therapy

## 2016-05-19 DIAGNOSIS — R2681 Unsteadiness on feet: Secondary | ICD-10-CM | POA: Diagnosis not present

## 2016-05-19 DIAGNOSIS — R279 Unspecified lack of coordination: Secondary | ICD-10-CM

## 2016-05-19 DIAGNOSIS — R2689 Other abnormalities of gait and mobility: Secondary | ICD-10-CM

## 2016-05-19 DIAGNOSIS — R262 Difficulty in walking, not elsewhere classified: Secondary | ICD-10-CM

## 2016-05-19 NOTE — Therapy (Addendum)
[facility] [address]  [place], [place], [address] Phone: [phone]   Fax:  [phone]  Physical Therapy Treatment  Patient Details  Name: Justin [name] MRN: [id number] Date of Birth: 16-Sep-1928 Referring Provider: Ames Coupe, DO  Encounter Date: 05/19/2016      PT End of Session - 05/19/16 0800    Visit Number 10   Number of Visits 16   Date for PT Re-Evaluation 06/04/16   Authorization Type [employer] (follow Medicare guidelines)   PT Start Time 0800   PT Stop Time 0844   PT Time Calculation (min) 44 min   Activity Tolerance Patient tolerated treatment well   Behavior During Therapy [facility] for tasks assessed/performed      Past Medical History:  Diagnosis Date  . Basal cell carcinoma   . BPH (benign prostatic hypertrophy)   . CAD (coronary artery disease)    a. H/o MI;  b. s/p CABG  . Disequilibrium 04/03/2012  . HTN (hypertension)   . Hyperlipidemia   . Internal hemorrhoids   . Medicare annual wellness visit, subsequent 03/16/2015   Follows with cardiology, Dr Aundra Dubin, Dr Bing Neighbors with dermatology, Dr Altamese Cabal Aged out of colonoscopy  . Moderate aortic stenosis    a. 08/2014 Echo: EF 55-60%, no rwma, Gr 1 DD, mod AS, mildly dil asc Ao, mild MR, sev dil LA.  [name] PAF (paroxysmal atrial fibrillation) ([place])    a. Dx 08/2014;  b. CHA2DS2VASc = 4 - decision made to withold [place] 2/2 unsteady gait and syncope.  . Sigmoid diverticulitis   . Syncope    a. 08/2014 - ? orthostatic    Past Surgical History:  Procedure Laterality Date  . COLONOSCOPY    . CORONARY ARTERY BYPASS GRAFT    . HIP ARTHROPLASTY Left 10/14/2015   Procedure: LEFT HIP HEMI ARTHROPLASTY;  Surgeon: [name], MD;  Location: [facility];  Service: Orthopedics;  Laterality: Left;  . SHOULDER SURGERY     x2    There were no vitals filed for this visit.      Subjective Assessment - 05/19/16 0803    Subjective Pt reports  feeling stronger than last visit.   Pertinent History L hip fracture s/p fall in April 2017 - L hip hemiarthroplasty 10/14/15   Patient Stated Goals "Improve balance and decrease likelihood for falls"   Currently in Pain? No/denies            [facility] PT Assessment - 05/19/16 0800      Assessment   Medical Diagnosis Poor balance   Referring Provider Ames Coupe, DO   Onset Date/Surgical Date --  3-4 yrs   Next MD Visit none for Dr. Nani Ravens; 05/25/16 with Dr. Tomi Likens; 06/04/16 with [name], MD (PCP)     Observation/Other Assessments   Focus on Therapeutic Outcomes (FOTO)  Neuromuscular disorder 60% (40% limitation)     Ambulation/Gait   Ambulation/Gait Assistance 5: Supervision   Assistive device None   Gait Pattern Step-through pattern;Trunk flexed;Shuffle;Decreased step length - right;Decreased step length - left;Poor foot clearance - left;Poor foot clearance - right;Decreased arm swing - right;Decreased arm swing - left   Gait Comments cues for normalized gait pattern including improved upright posture, increased step length and increased foot clearance with reciprocal arm swing     Standardized Balance Assessment   Standardized Balance Assessment Berg Balance Test;Timed Up and Go Test;10 meter walk test   10 Meter Walk 3.05 ft/sec  10.75" w/o AD; 2.83 ft/sec - 11.59" w/ SPC     Berg Balance Test   Sit to Stand Able to stand without using hands and stabilize independently   Standing Unsupported Able to stand safely 2 minutes   Sitting with Back Unsupported but Feet Supported on Floor or Stool Able to sit safely and securely 2 minutes   Stand to Sit Sits safely with minimal use of hands   Transfers Able to transfer safely, minor use of hands   Standing Unsupported with Eyes Closed Able to stand 10 seconds with supervision   Standing Ubsupported with Feet Together Able to place feet together independently and stand for 1 minute with supervision   From Standing, Reach  Forward with Outstretched Arm Can reach forward >5 cm safely (2")   From Standing Position, Pick up Object from [place] to pick up shoe safely and easily   From Standing Position, Turn to Look Behind Over each Shoulder Looks behind one side only/other side shows less weight shift   Turn 360 Degrees Able to turn 360 degrees safely but slowly   Standing Unsupported, Alternately Place Feet on Step/Stool Needs assistance to keep from falling or unable to try   Standing Unsupported, One Foot in Front Able to take small step independently and hold 30 seconds   Standing on One Leg Tries to lift leg/unable to hold 3 seconds but remains standing independently   Total Score 40   Berg comment: 37-45 significant (>80%)     Timed Up and Go Test   TUG Normal TUG   Normal TUG (seconds) 10.25  w/o AD (unsteady step pattern); 10.61" w/ SPC     Functional Gait  Assessment   Gait Level Surface Walks 20 ft in less than 7 sec but greater than 5.5 sec, uses assistive device, slower speed, mild gait deviations, or deviates 6-10 in outside of the 12 in walkway width.   Change in Gait Speed Able to smoothly change walking speed without loss of balance or gait deviation. Deviate no more than 6 in outside of the 12 in walkway width.   Gait with Horizontal Head Turns Performs head turns with moderate changes in gait velocity, slows down, deviates 10-15 in outside 12 in walkway width but recovers, can continue to walk.   Gait with Vertical Head Turns Performs task with moderate change in gait velocity, slows down, deviates 10-15 in outside 12 in walkway width but recovers, can continue to walk.   Gait and Pivot Turn Pivot turns safely within 3 sec and stops quickly with no loss of balance.   Step Over Obstacle Is able to step over one shoe box (4.5 in total height) but must slow down and adjust steps to clear box safely. May require verbal cueing.   Gait with Narrow Base of Support Ambulates less than 4 steps heel to  toe or cannot perform without assistance.   Gait with Eyes Closed Cannot walk 20 ft without assistance, severe gait deviations or imbalance, deviates greater than 15 in outside 12 in walkway width or will not attempt task.   Ambulating Backwards Cannot walk 20 ft without assistance, severe gait deviations or imbalance, deviates greater than 15 in outside 12 in walkway width or will not attempt task.   Steps Alternating feet, must use rail.   Total Score 13   FGA comment: < 19 = high risk fall  [facility] PT Treatment/Exercise - 06-15-2016 0800      Knee/Hip Exercises: Aerobic   Nustep lvl 6 x 5'                  PT Short Term Goals - 05/05/16 0804      PT SHORT TERM GOAL #1   Title Independent with initial strengthening HEP by 04/30/16   Status Achieved     PT SHORT TERM GOAL #2   Title TUG < 13.5 sec to decrease fall risk by 05/07/16   Status Achieved           PT Long Term Goals - 06-15-2016 0804      PT LONG TERM GOAL #1   Title Independent with balance HEP or other advanced HEP as indicated by 06/04/16   Status Partially Met  Met for current HEP     PT LONG TERM GOAL #2   Title Gait speed >/= 2.8 ft/sec with or w/o SPC for improved safety with community ambulation by 06/04/16   Status Achieved     PT LONG TERM GOAL #3   Title Berg balance >/= 48/56 to decrease risk for falls by 06/04/16   Status On-going  Currently 40/56     PT LONG TERM GOAL #4   Title FGA >/= 19/30 to improve gait stability and reduce fall risk by 06/04/16   Status On-going  Currently 13/30               Plan - 15-Jun-2016 0804    Clinical Impression Statement Pt demonstrating good progress with PT with improving gait pattern although he remains inconsistent in follow through frequently reverting to shuffling gait esp when trying to hurry. Improvements noted on all standardized testing, with Berg & FGA both improved by 4 points each, and gait speed improved  by ~1/2 ft/sec. LTG goal met for gait speed when pt demonstrating more nromalized gait pattern. Pt demonstrates good poterntial to further skilled PT for balance training, gait training and dynamic gait activities to improve safety and stability to decrease fall risk.   PT Treatment/Interventions Patient/family education;Neuromuscular re-education;Balance training;Vestibular;Therapeutic exercise;Therapeutic activities;Functional mobility training;Gait training;ADLs/Self Care Home Management   PT Next Visit Plan Gait training to normalize gait pattern; Continue hip strengthening; Balance training; Dynamic gait training   Consulted and Agree with Plan of Care Patient      Patient will benefit from skilled therapeutic intervention in order to improve the following deficits and impairments:  Decreased balance, Decreased coordination, Difficulty walking, Abnormal gait, Postural dysfunction, Decreased strength, Decreased activity tolerance, Decreased endurance, Impaired vision/preception, Impaired perceived functional ability, Decreased safety awareness  Visit Diagnosis: Unsteadiness on feet  Unspecified lack of coordination  Other abnormalities of gait and mobility  Difficulty in walking, not elsewhere classified       G-Codes - Jun 15, 2016 0805    Functional Assessment Tool Used Merrilee Jansky = 40/56 (28.6% limitation) + clinical judgement   Functional Limitation Mobility: Walking and moving around   Mobility: Walking and Moving Around Current Status [phone]) At least 20 percent but less than 40 percent impaired, limited or restricted   Mobility: Walking and Moving Around Goal Status [phone]) At least 20 percent but less than 40 percent impaired, limited or restricted      Problem List Patient Active Problem List   Diagnosis Date Noted  . Fall   . Closed left hip fracture ([place]) 10/13/2015  . Medicare annual wellness visit, subsequent 03/16/2015  . Paroxysmal atrial fibrillation ([place]) 09/26/2014  .  Orthostatic hypotension 09/15/2014  . Syncope   . Head trauma 09/14/2014  . Alteration in anticoagulation 09/14/2014  . Tinnitus 03/21/2014  . Epigastric pain 02/01/2013  . Gait instability 01/01/2013  . Disequilibrium 04/03/2012  . Hyperglycemia 09/13/2011  . Aortic stenosis 03/10/2011  . CAD, ARTERY BYPASS GRAFT 02/23/2010  . Hyperlipidemia 07/04/2008  . Essential hypertension 07/04/2008  . Coronary atherosclerosis 07/04/2008  . RHINITIS 07/04/2008  . BENIGN PROSTATIC HYPERTROPHY, WITH OBSTRUCTION 07/04/2008    Percival Spanish, PT, MPT 05/19/2016, 11:46 AM  [facility] [place], [place], [address] Phone: [phone]   Fax:  [phone]  Name: LUISENRIQUE CONRAN MRN: [id number] Date of Birth: [birth year]   Physical Therapy Progress Note  Dates of Reporting Period: 04/08/16 to 05/19/16  Objective Reports of Subjective Statement: Pt noting benefit from PT with improved balance and walking but still notes family reminding him not to shuffle when walking.  Objective Measurements: Refer to above standardized testing  Goal Update: All STGs met. LTG #2 met, #1 met for current HEP, and remaining goals ongoing.  Plan: Gait training to normalize gait pattern; Continue hip strengthening; Balance training; Dynamic gait training  Reason Skilled Services are Required: Pt demonstrating good progress with PT with improving gait pattern although he remains inconsistent in follow through frequently reverting to shuffling gait esp when trying to hurry. Improvements noted on all standardized testing, with Berg & FGA both improved by 4 points each, and gait speed improved by ~1/2 ft/sec. LTG goal met for gait speed when pt demonstrating more nromalized gait pattern. Pt demonstrates good poterntial to further skilled PT for balance training, gait training and dynamic gait activities to improve safety and stability to decrease  fall risk.

## 2016-05-25 ENCOUNTER — Other Ambulatory Visit (INDEPENDENT_AMBULATORY_CARE_PROVIDER_SITE_OTHER): Payer: PPO

## 2016-05-25 ENCOUNTER — Encounter: Payer: Self-pay | Admitting: Neurology

## 2016-05-25 ENCOUNTER — Ambulatory Visit (INDEPENDENT_AMBULATORY_CARE_PROVIDER_SITE_OTHER): Payer: PPO | Admitting: Neurology

## 2016-05-25 VITALS — BP 152/72 | HR 69 | Ht 65.0 in | Wt 146.0 lb

## 2016-05-25 DIAGNOSIS — R2681 Unsteadiness on feet: Secondary | ICD-10-CM

## 2016-05-25 DIAGNOSIS — G609 Hereditary and idiopathic neuropathy, unspecified: Secondary | ICD-10-CM | POA: Diagnosis not present

## 2016-05-25 DIAGNOSIS — I10 Essential (primary) hypertension: Secondary | ICD-10-CM

## 2016-05-25 DIAGNOSIS — G3184 Mild cognitive impairment, so stated: Secondary | ICD-10-CM

## 2016-05-25 LAB — VITAMIN B12: Vitamin B-12: 599 pg/mL (ref 211–911)

## 2016-05-25 MED ORDER — DONEPEZIL HCL 5 MG PO TABS: 5.0000 mg | ORAL_TABLET | Freq: Every day | ORAL | 0 refills | Status: DC

## 2016-05-25 NOTE — Progress Notes (Signed)
NEUROLOGY NOTE  PERVIS SCHUENEMAN MRN: [id number] DOB: 1928-07-09  Referring provider: Dr. Charlett Blake Primary care provider: Dr. Charlett Blake  Reason for consult:  Balance problems, memory problems.  HISTORY OF PRESENT ILLNESS: Justin [name] is an 82 year old man with paroxysmal atrial fibrillation, orthostatic hypotension, hypertension, hyperlipidemia, and CAD whom I previously saw in 2014 for dizziness and gait instability returns for memory problems and continued balance problems.  He is accompanied by his wife who supplements history.  He has had problems with balance for 5 years.  He loses balance easily if his eyes are closed or if he stands in one place for several minutes.  He cannot stand on one foot. He also notes trouble walking down hills as well as walking up stairs. When he walks, he will sometimes stumble.  He also has chronic problems with his left foot though, such as bunions. He denies any neck pain or lower back pain radiating down the legs. He denies any sensation of numbness and tingling. He denies focal weakness but occasionally he reports generalized weakness where his legs give out. He denies any bowel or bladder dysfunction. When he closes his eyes, he has a sensation of sway, but no actual vertigo or fullness in his head. He does have occasional tinnitus and wears hearing aids. He has not had any sensation of passing out.    I saw him in 2014 and he underwent a neuropathy workup.  NCV-EMG was normal.  2 hour glucose tolerance test was normal.  B12 was 542, methylmalonic acid 0.11,  Sed Rate 12, folate over 24.8, RPR nonreactive, SPEP and IFE negative, ANA negative.  Recent Hgb A1c wasa 5.2 and TSH was 4.34.  For a couple of years he had had episodes of confusion where he cannot process or collect his thoughts.  It may last up to an hour and occur once a week.  He may forget medications or names of people.  After he had knee surgery in April, he was more confused for a bit.  He had  had imaging of the head for dizziness.  CT of head from 09/13/14 was personally reviewed and revealed atrophy and chronic small vessel ischemic changes but nothing acute.  Repeat CT of head from 03/23/16 was unchanged.  PAST MEDICAL HISTORY: Past Medical History:  Diagnosis Date  . Basal cell carcinoma   . BPH (benign prostatic hypertrophy)   . CAD (coronary artery disease)    a. H/o MI;  b. s/p CABG  . Disequilibrium 04/03/2012  . HTN (hypertension)   . Hyperlipidemia   . Internal hemorrhoids   . Medicare annual wellness visit, subsequent 03/16/2015   Follows with cardiology, Dr Aundra Dubin, Dr Bing Neighbors with dermatology, Dr Altamese Cabal Aged out of colonoscopy  . Moderate aortic stenosis    a. 08/2014 Echo: EF 55-60%, no rwma, Gr 1 DD, mod AS, mildly dil asc Ao, mild MR, sev dil LA.  [name] PAF (paroxysmal atrial fibrillation) ([place])    a. Dx 08/2014;  b. CHA2DS2VASc = 4 - decision made to withold [place] 2/2 unsteady gait and syncope.  . Sigmoid diverticulitis   . Syncope    a. 08/2014 - ? orthostatic    PAST SURGICAL HISTORY: Past Surgical History:  Procedure Laterality Date  . COLONOSCOPY    . CORONARY ARTERY BYPASS GRAFT    . HIP ARTHROPLASTY Left 10/14/2015   Procedure: LEFT HIP HEMI ARTHROPLASTY;  Surgeon: [name], MD;  Location: [facility];  Service: Orthopedics;  Laterality:  Left;  . SHOULDER SURGERY     x2    MEDICATIONS: Current Outpatient Prescriptions on File Prior to Visit  Medication Sig Dispense Refill  . acetaminophen (TYLENOL) 325 MG tablet Take 1-2 tablets (325-650 mg total) by mouth every 6 (six) hours as needed for mild pain or moderate pain. 40 tablet 0  . apixaban (ELIQUIS) 5 MG TABS tablet Take 1 tablet (5 mg total) by mouth 2 (two) times daily. 60 tablet 0  . CARTIA XT 120 MG 24 hr capsule TAKE ONE CAPSULE BY MOUTH ONCE DAILY 30 capsule 11  . cholecalciferol (VITAMIN D) 1000 UNITS tablet Take 1,000 Units by mouth daily.     . hydrochlorothiazide (MICROZIDE) 12.5 MG  capsule Take 1 capsule (12.5 mg total) by mouth daily. (Patient taking differently: Take 12.5 mg by mouth daily as needed. ) 90 capsule 3  . lovastatin (MEVACOR) 20 MG tablet TAKE ONE TABLET BY MOUTH ONCE DAILY AT BEDTIME 90 tablet 3  . Multiple Vitamin (MULTIVITAMIN) capsule Take 0.5 capsules by mouth daily.     . multivitamin-lutein (OCUVITE-LUTEIN) CAPS Take 1 capsule by mouth daily.     . Probiotic Product (PROBIOTIC DAILY PO) Take 1 capsule by mouth daily.     No current facility-administered medications on file prior to visit.     ALLERGIES: Allergies  Allergen Reactions  . Toradol [Ketorolac Tromethamine] Other (See Comments)    CNS-AMS    FAMILY HISTORY: Family History  Problem Relation Age of Onset  . Coronary artery disease Father   . Hyperlipidemia Father   . Heart attack Father   . Alcohol abuse Father   . Bipolar disorder Mother   . Heart disease Mother   . Alcohol abuse Brother   . Cancer Brother   . Cancer Maternal Grandfather     oral cancer, chew tobacco  . Cancer Sister   . Glaucoma Sister   . Alcohol abuse      SOCIAL HISTORY: Social History   Social History  . Marital status: Married    Spouse name: N/A  . Number of children: 2  . Years of education: N/A   Occupational History  . 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 Smoking status: Former Smoker    Packs/day: 0.50    Years: 15.00    Quit date: 06/29/1971  . Smokeless tobacco: Never Used  . Alcohol use Yes     Comment: 1-2 glasses of wine  . Drug use: No  . Sexual activity: No     Comment: lives with wife, retired English as a second language teacher, no dietary restrictions   Other Topics Concern  . Not on file   Social History Narrative  . No narrative on file    REVIEW OF SYSTEMS: Constitutional: No fevers, chills, or sweats, no generalized fatigue, change in appetite Eyes: No visual changes, double vision, eye pain Ear, nose and throat: No hearing  loss, ear pain, nasal congestion, sore throat Cardiovascular: No chest pain, palpitations Respiratory:  No shortness of breath at rest or with exertion, wheezes GastrointestinaI: No nausea, vomiting, diarrhea, abdominal pain, fecal incontinence Genitourinary:  No dysuria, urinary retention or frequency Musculoskeletal:  No neck pain, back pain Integumentary: No rash, pruritus, skin lesions Neurological: as above Psychiatric: No depression, insomnia, anxiety Endocrine: No palpitations, fatigue, diaphoresis, mood swings, change in appetite, change in weight, increased thirst Hematologic/Lymphatic:  No purpura, petechiae. Allergic/Immunologic: no itchy/runny eyes, nasal congestion, recent allergic reactions, rashes  PHYSICAL EXAM: Vitals:  05/25/16 1439  BP: (!) 152/72  Pulse: 69   General: No acute distress.  Patient appears well-groomed.  Head:  Normocephalic/atraumatic Eyes:  fundi examined but not visualized Neck: supple, no paraspinal tenderness, full range of motion Back: No paraspinal tenderness Heart: regular rate and rhythm Lungs: Clear to auscultation bilaterally. Vascular: No carotid bruits. Neurological Exam: Mental status: alert and oriented to person, place, and time, delayed recall poor, remote memory intact, fund of knowledge intact, attention and concentration intact, speech fluent and not dysarthric, language intact. MMSE - Mini Mental State Exam 05/25/2016  Orientation to time 5  Orientation to Place 4  Registration 3  Attention/ Calculation 5  Recall 1  Language- name 2 objects 2  Language- repeat 1  Language- follow 3 step command 3  Language- read & follow direction 1  Write a sentence 1  Copy design 1  Total score 27   Cranial nerves: CN I: not tested CN II: pupils equal, round and reactive to light, visual fields intact CN III, IV, VI:  full range of motion, no nystagmus, no ptosis CN V: facial sensation intact CN VII: upper and lower face  symmetric CN VIII: hearing intact CN IX, X: gag intact, uvula midline CN XI: sternocleidomastoid and trapezius muscles intact CN XII: tongue midline Bulk & Tone: normal, no fasciculations. Motor:  5/5 throughout  Sensation: temperature intact and vibration sensation reduced in feet. Deep Tendon Reflexes:  2+ throughout, except 3+ in knees, toes downgoing.  Finger to nose testing:  Without dysmetria.  Heel to shin:  Without dysmetria.  Gait:  Unsteady, shuffling gait.  Able to turn, unable to tandem walk. Romberg positive.  IMPRESSION: 1.  Gait instability.  I believe it is likely related to an underlying peripheral neuropathy, despite normal NCV several years ago.  He exhibits proprioception loss.  A myelopathy due to cervical stenosis or degenerative disc disease is possible too.  He has slightly brisk reflexes in the knees, which is not necessarily pathologic.  He exhibited shuffling gait but otherwise does not show signs of Parkinson's disease. 2.  Memory deficits and episodes of slight confusion (or more accurately difficulty putting his thoughts together).  Probably underlying mild cognitive impairment. 3.  HTN  PLAN: 1.  For gait, the primary management is supportive care (PT, continued use of walking assisted device, ambulating only in lighted areas).  We can order an MRI of the cervical spine to rule out myelopathy/stenosis, however I explained that it likely won't change management since I don't feel he would be a surgical candidate due to risk factors.  However, if they wish to pursue MRI, they will contact me and I will order it.  I wouldn't repeat NCV-EMG since it would not change management (previous neuropathy workup was negative) and I would rather spare him the needle exam since he easily bleeds due to anticoagulation. 2.  For memory, we will initiate Aricept as it may help slow progression if he does have an underlying neurodegenerative process such as Alzheimer's 3.  We will  repeat B12 4.  Follow up in 9 months. 5.  Blood pressure elevated so recommend recheck with PCP.  Thank you for allowing me to take part in the care of this patient.  Metta Clines, DO  CC:  [name], MD

## 2016-05-25 NOTE — Patient Instructions (Signed)
1.  I think the balance problems are likely related to a neuropathy or nerve problem in the feet.  A pinching of the spinal cord in the neck (due to arthritis or a bulging disc) is also possible.  We can order an MRI of the spine in the neck to see, however I am not sure if that would change management as I have a low suspicion you would be a surgical candidate.  If you would still like to pursue the MRI, let me know.  Otherwise, continue physical therapy and use the cane at all times.  2.  You may have mild cognitive problems that occasionally causes confusion.  In case this may be a progressive disease, we will start donepezil (Aricept) 5mg  daily for four weeks.  If you are tolerating the medication, then after four weeks, we will increase the dose to 10mg  daily.  Side effects include nausea, vomiting, diarrhea, vivid dreams, and muscle cramps.  Please call the clinic if you experience any of these symptoms.  3.  We will repeat B12 level.  4.  Follow up in 9 months for reevaluation of memory.

## 2016-05-26 ENCOUNTER — Telehealth: Payer: Self-pay

## 2016-05-26 ENCOUNTER — Ambulatory Visit: Payer: PPO | Admitting: Physical Therapy

## 2016-05-26 DIAGNOSIS — R262 Difficulty in walking, not elsewhere classified: Secondary | ICD-10-CM

## 2016-05-26 DIAGNOSIS — R2689 Other abnormalities of gait and mobility: Secondary | ICD-10-CM

## 2016-05-26 DIAGNOSIS — R2681 Unsteadiness on feet: Secondary | ICD-10-CM

## 2016-05-26 DIAGNOSIS — R279 Unspecified lack of coordination: Secondary | ICD-10-CM

## 2016-05-26 NOTE — Telephone Encounter (Signed)
-----   Message from Pieter Partridge, DO sent at 05/26/2016  7:59 AM EST ----- b12 is normal

## 2016-05-26 NOTE — Telephone Encounter (Signed)
Message relayed to patient. Verbalized understanding and denied questions.   

## 2016-05-27 NOTE — Therapy (Signed)
[facility] [address]  [place], [place], [address] Phone: [phone]   Fax:  [phone]  Physical Therapy Treatment  Patient Details  Name: Justin [name] MRN: [id number] Date of Birth: [birth year] Referring Provider: Ames Coupe, DO  Encounter Date: 05/26/2016      PT End of Session - 05/26/16 0801    Visit Number 11   Number of Visits 16   Date for PT Re-Evaluation 06/04/16   Authorization Type [employer] (follow Medicare guidelines)   PT Start Time 0801   PT Stop Time 0849   PT Time Calculation (min) 48 min   Activity Tolerance Patient tolerated treatment well   Behavior During Therapy [facility] for tasks assessed/performed      Past Medical History:  Diagnosis Date  . Basal cell carcinoma   . BPH (benign prostatic hypertrophy)   . CAD (coronary artery disease)    a. H/o MI;  b. s/p CABG  . Disequilibrium 04/03/2012  . HTN (hypertension)   . Hyperlipidemia   . Internal hemorrhoids   . Medicare annual wellness visit, subsequent 03/16/2015   Follows with cardiology, Dr Aundra Dubin, Dr Bing Neighbors with dermatology, Dr Altamese Cabal Aged out of colonoscopy  . Moderate aortic stenosis    a. 08/2014 Echo: EF 55-60%, no rwma, Gr 1 DD, mod AS, mildly dil asc Ao, mild MR, sev dil LA.  [name] PAF (paroxysmal atrial fibrillation) ([place])    a. Dx 08/2014;  b. CHA2DS2VASc = 4 - decision made to withold [place] 2/2 unsteady gait and syncope.  . Sigmoid diverticulitis   . Syncope    a. 08/2014 - ? orthostatic    Past Surgical History:  Procedure Laterality Date  . COLONOSCOPY    . CORONARY ARTERY BYPASS GRAFT    . HIP ARTHROPLASTY Left 10/14/2015   Procedure: LEFT HIP HEMI ARTHROPLASTY;  Surgeon: [name], MD;  Location: [place];  Service: Orthopedics;  Laterality: Left;  . SHOULDER SURGERY     x2    There were no vitals filed for this visit.      Subjective Assessment - 05/26/16 0805    Subjective Pt reports  he saw the neurologist yesterday and was told he most likely has neuropathy in his feet. Was started on Aricept which he will pick up today.   Patient Stated Goals "Improve balance and decrease likelihood for falls"   Currently in Pain? No/denies                  [facility] Adult PT Treatment/Exercise - 05/26/16 0801      Knee/Hip Exercises: Aerobic   Nustep lvl 4 x 5'           PWR [facility]) - 05/26/16 0801    PWR! exercises Functional moves   PWR! Step Through Forward/Back Fwd (lg step) x10, Back (lg step + wt shift) x10          Balance Exercises - 05/26/16 0801      OTAGO PROGRAM   Head Movements Standing;5 reps   Neck Movements Standing;5 reps   Back Extension Standing;5 reps   Trunk Movements Standing;5 reps   Knee Flexor 20 reps;Weight (comment)  2#   Hip ABductor 20 reps   Ankle Plantorflexors 20 reps, no support   Ankle Dorsiflexors 20 reps, no support   Knee Bends 20 reps, no support   Backwards Walking Support   Walking and Turning Around Assistive device   Sideways Walking No  assistive device   Tandem Stance 10 seconds, support   Tandem Walk Support   One Leg Stand 10 seconds, support   Heel Walking Support   Toe Walk Support   Sit to Stand 10 reps, no support             PT Short Term Goals - 05/05/16 0804      PT SHORT TERM GOAL #1   Title Independent with initial strengthening HEP by 04/30/16   Status Achieved     PT SHORT TERM GOAL #2   Title TUG < 13.5 sec to decrease fall risk by 05/07/16   Status Achieved           PT Long Term Goals - 05/19/16 0804      PT LONG TERM GOAL #1   Title Independent with balance HEP or other advanced HEP as indicated by 06/04/16   Status Partially Met  Met for current HEP     PT LONG TERM GOAL #2   Title Gait speed >/= 2.8 ft/sec with or w/o SPC for improved safety with community ambulation by 06/04/16   Status Achieved     PT LONG TERM GOAL #3   Title Berg balance >/= 48/56 to decrease risk  for falls by 06/04/16   Status On-going  Currently 40/56     PT LONG TERM GOAL #4   Title FGA >/= 19/30 to improve gait stability and reduce fall risk by 06/04/16   Status On-going  Currently 13/30               Plan - 05/26/16 0806    Clinical Impression Statement Reviewed OTAGO and progressed activities working on reducing UE support as appropriate, but pt continues to require UE support for all tandem activities as well as walking backward. Incorporated PWR! Step forward and backward to facilitate proer wt shift and increased step length.   Clinical Impairments Affecting Rehab Potential L hip fracture s/p fall in April 2017 - L hip hemiarthroplasty 10/14/15, HTN, afib, syncope, orthostatic hypotension   PT Treatment/Interventions Patient/family education;Neuromuscular re-education;Balance training;Vestibular;Therapeutic exercise;Therapeutic activities;Functional mobility training;Gait training;ADLs/Self Care Home Management   PT Next Visit Plan Gait training to normalize gait pattern; Continue hip strengthening; Balance training; Dynamic gait training   Consulted and Agree with Plan of Care Patient      Patient will benefit from skilled therapeutic intervention in order to improve the following deficits and impairments:  Decreased balance, Decreased coordination, Difficulty walking, Abnormal gait, Postural dysfunction, Decreased strength, Decreased activity tolerance, Decreased endurance, Impaired vision/preception, Impaired perceived functional ability, Decreased safety awareness  Visit Diagnosis: Unsteadiness on feet  Unspecified lack of coordination  Other abnormalities of gait and mobility  Difficulty in walking, not elsewhere classified     Problem List Patient Active Problem List   Diagnosis Date Noted  . Fall   . Closed left hip fracture ([place]) 10/13/2015  . Medicare annual wellness visit, subsequent 03/16/2015  . Paroxysmal atrial fibrillation ([place]) 09/26/2014   . Orthostatic hypotension 09/15/2014  . Syncope   . Head trauma 09/14/2014  . Alteration in anticoagulation 09/14/2014  . Tinnitus 03/21/2014  . Epigastric pain 02/01/2013  . Gait instability 01/01/2013  . Disequilibrium 04/03/2012  . Hyperglycemia 09/13/2011  . Aortic stenosis 03/10/2011  . CAD, ARTERY BYPASS GRAFT 02/23/2010  . Hyperlipidemia 07/04/2008  . Essential hypertension 07/04/2008  . Coronary atherosclerosis 07/04/2008  . RHINITIS 07/04/2008  . BENIGN PROSTATIC HYPERTROPHY, WITH OBSTRUCTION 07/04/2008    Percival Spanish, PT, MPT  05/27/2016, 12:31 PM  [facility] [address]  [place], [place], [address] Phone: [phone]   Fax:  [phone]  Name: Justin [name] MRN: [id number] Date of Birth: 1928/09/12

## 2016-05-28 ENCOUNTER — Ambulatory Visit: Payer: PPO | Attending: Family Medicine | Admitting: Physical Therapy

## 2016-05-28 DIAGNOSIS — R2689 Other abnormalities of gait and mobility: Secondary | ICD-10-CM

## 2016-05-28 DIAGNOSIS — R2681 Unsteadiness on feet: Secondary | ICD-10-CM | POA: Diagnosis not present

## 2016-05-28 DIAGNOSIS — R262 Difficulty in walking, not elsewhere classified: Secondary | ICD-10-CM

## 2016-05-28 DIAGNOSIS — R279 Unspecified lack of coordination: Secondary | ICD-10-CM

## 2016-05-28 NOTE — Therapy (Signed)
[facility] [address]  [place], [place], [address] Phone: [phone]   Fax:  [phone]  Physical Therapy Treatment  Patient Details  Name: Justin [name] MRN: [id number] Date of Birth: 1928-12-30 Referring Provider: Ames Coupe, DO  Encounter Date: 05/28/2016      PT End of Session - 05/28/16 0801    Visit Number 12   Number of Visits 16   Date for PT Re-Evaluation 06/04/16   Authorization Type [employer] (follow Medicare guidelines)   PT Start Time 0801   PT Stop Time 0842   PT Time Calculation (min) 41 min   Activity Tolerance Patient tolerated treatment well   Behavior During Therapy [facility] for tasks assessed/performed      Past Medical History:  Diagnosis Date  . Basal cell carcinoma   . BPH (benign prostatic hypertrophy)   . CAD (coronary artery disease)    a. H/o MI;  b. s/p CABG  . Disequilibrium 04/03/2012  . HTN (hypertension)   . Hyperlipidemia   . Internal hemorrhoids   . Medicare annual wellness visit, subsequent 03/16/2015   Follows with cardiology, Dr Aundra Dubin, Dr Bing Neighbors with dermatology, Dr Altamese Cabal Aged out of colonoscopy  . Moderate aortic stenosis    a. 08/2014 Echo: EF 55-60%, no rwma, Gr 1 DD, mod AS, mildly dil asc Ao, mild MR, sev dil LA.  [name] PAF (paroxysmal atrial fibrillation) ([place])    a. Dx 08/2014;  b. CHA2DS2VASc = 4 - decision made to withold [place] 2/2 unsteady gait and syncope.  . Sigmoid diverticulitis   . Syncope    a. 08/2014 - ? orthostatic    Past Surgical History:  Procedure Laterality Date  . COLONOSCOPY    . CORONARY ARTERY BYPASS GRAFT    . HIP ARTHROPLASTY Left 10/14/2015   Procedure: LEFT HIP HEMI ARTHROPLASTY;  Surgeon: [name], MD;  Location: [place];  Service: Orthopedics;  Laterality: Left;  . SHOULDER SURGERY     x2    There were no vitals filed for this visit.      Subjective Assessment - 05/28/16 0805    Subjective Pt reports he  started taking the donepezil HCL (Aricept) yesterday and woke up in the night with some diarrhea. Has been okfor the last few hours and wants to try proceeding with PT today.   Patient Stated Goals "Improve balance and decrease likelihood for falls"   Currently in Pain? No/denies                    [facility] Adult PT Treatment/Exercise - 05/28/16 0801      Ambulation/Gait   Ambulation/Gait Assistance 5: Supervision   Ambulation/Gait Assistance Details promoted carryover of PWR! Step/Walking program to facilitate increased stride length and improved gait stability   Ambulation Distance (Feet) 270 Feet  x2 - with & w/o SPC   Assistive device Straight cane;None   Gait Pattern Step-through pattern;Trunk flexed;Decreased stride length   Gait Comments pt reverting to shorter stride with shuffling gait with fatigue or when encountering obstacles     Knee/Hip Exercises: Aerobic   Nustep lvl 4 x 6'     Knee/Hip Exercises: Standing   Hip Flexion Both;20 reps;Knee straight   Hip Flexion Limitations red TB looped around ankles   Hip Abduction Both;20 reps;Knee straight   Abduction Limitations red TB   Hip Extension Both;20 reps;Knee straight   Extension Limitations red TB   Other Standing Knee  Exercises Side-stepping and monster walk with red TB along counter 2 x 42f           PWR ([facility] - 05/28/16 0801    PWR! Step Through Forward/Back Fwd (lg step + wt shift) x10, Back (lg step + wt shift) x10; Combined fwd/back x10             PT Education - 05/28/16 0834    Education provided Yes   Education Details Progressed resistance with standing 3 way hip exercises to red TB for HEP; PWR! Walking program added to [employer] Educated Patient   Methods Explanation;Demonstration;Handout   Comprehension Verbalized understanding;Returned demonstration;Need further instruction          PT Short Term Goals - 05/05/16 0804      PT SHORT TERM GOAL #1   Title Independent  with initial strengthening HEP by 04/30/16   Status Achieved     PT SHORT TERM GOAL #2   Title TUG < 13.5 sec to decrease fall risk by 05/07/16   Status Achieved           PT Long Term Goals - 05/19/16 0804      PT LONG TERM GOAL #1   Title Independent with balance HEP or other advanced HEP as indicated by 06/04/16   Status Partially Met  Met for current HEP     PT LONG TERM GOAL #2   Title Gait speed >/= 2.8 ft/sec with or w/o SPC for improved safety with community ambulation by 06/04/16   Status Achieved     PT LONG TERM GOAL #3   Title Berg balance >/= 48/56 to decrease risk for falls by 06/04/16   Status On-going  Currently 40/56     PT LONG TERM GOAL #4   Title FGA >/= 19/30 to improve gait stability and reduce fall risk by 06/04/16   Status On-going  Currently 13/30               Plan - 05/28/16 0807    Clinical Impression Statement Upgraded resistance to red TB for standing 3 way hip exercises with HEP. Provided training in [place] Moves Walking Program to promote longer stride length for improved stability with gait. Pt demonstrating increased difficulty with weight shift and increased step length intially on L but improved with repetition. Promoted carryover of larger step pattern to gait both with and w/o cane, but pt still reverting to shorter shuffling gait with fatigue or when encountering obstacles.   Clinical Impairments Affecting Rehab Potential L hip fracture s/p fall in April 2017 - L hip hemiarthroplasty 10/14/15, HTN, afib, syncope, orthostatic hypotension   PT Treatment/Interventions Patient/family education;Neuromuscular re-education;Balance training;Vestibular;Therapeutic exercise;Therapeutic activities;Functional mobility training;Gait training;ADLs/Self Care Home Management   PT Next Visit Plan Gait training to normalize gait pattern; Continue hip strengthening; Balance training; Dynamic gait training   Consulted and Agree with Plan of Care Patient       Patient will benefit from skilled therapeutic intervention in order to improve the following deficits and impairments:  Decreased balance, Decreased coordination, Difficulty walking, Abnormal gait, Postural dysfunction, Decreased strength, Decreased activity tolerance, Decreased endurance, Impaired vision/preception, Impaired perceived functional ability, Decreased safety awareness  Visit Diagnosis: Unsteadiness on feet  Unspecified lack of coordination  Other abnormalities of gait and mobility  Difficulty in walking, not elsewhere classified     Problem List Patient Active Problem List   Diagnosis Date Noted  . Fall   . Closed left hip fracture ([place] 10/13/2015  .  Medicare annual wellness visit, subsequent 03/16/2015  . Paroxysmal atrial fibrillation ([place]) 09/26/2014  . Orthostatic hypotension 09/15/2014  . Syncope   . Head trauma 09/14/2014  . Alteration in anticoagulation 09/14/2014  . Tinnitus 03/21/2014  . Epigastric pain 02/01/2013  . Gait instability 01/01/2013  . Disequilibrium 04/03/2012  . Hyperglycemia 09/13/2011  . Aortic stenosis 03/10/2011  . CAD, ARTERY BYPASS GRAFT 02/23/2010  . Hyperlipidemia 07/04/2008  . Essential hypertension 07/04/2008  . Coronary atherosclerosis 07/04/2008  . RHINITIS 07/04/2008  . BENIGN PROSTATIC HYPERTROPHY, WITH OBSTRUCTION 07/04/2008    Percival Spanish, PT, MPT 05/28/2016, 8:59 AM  [facility] [place], [place], [address] Phone: [phone]   Fax:  [phone]  Name: HIRO VIPOND MRN: [id number] Date of Birth: 08-23-1928

## 2016-06-01 ENCOUNTER — Ambulatory Visit: Payer: PPO | Admitting: Physical Therapy

## 2016-06-01 DIAGNOSIS — R2689 Other abnormalities of gait and mobility: Secondary | ICD-10-CM

## 2016-06-01 DIAGNOSIS — R2681 Unsteadiness on feet: Secondary | ICD-10-CM

## 2016-06-01 DIAGNOSIS — R279 Unspecified lack of coordination: Secondary | ICD-10-CM

## 2016-06-01 DIAGNOSIS — R262 Difficulty in walking, not elsewhere classified: Secondary | ICD-10-CM

## 2016-06-01 NOTE — Therapy (Signed)
[facility] [address]  [place], [place], [address] Phone: [phone]   Fax:  [phone]  Physical Therapy Treatment  Patient Details  Name: Justin [name] MRN: [id number] Date of Birth: [birth year] Referring Provider: Ames Coupe, DO  Encounter Date: 06/01/2016      PT End of Session - 06/01/16 0800    Visit Number 13   Number of Visits 16   Date for PT Re-Evaluation 06/04/16   Authorization Type [employer] (follow Medicare guidelines)   PT Start Time 0800   PT Stop Time 0845   PT Time Calculation (min) 45 min   Activity Tolerance Patient tolerated treatment well   Behavior During Therapy [facility] for tasks assessed/performed      Past Medical History:  Diagnosis Date  . Basal cell carcinoma   . BPH (benign prostatic hypertrophy)   . CAD (coronary artery disease)    a. H/o MI;  b. s/p CABG  . Disequilibrium 04/03/2012  . HTN (hypertension)   . Hyperlipidemia   . Internal hemorrhoids   . Medicare annual wellness visit, subsequent 03/16/2015   Follows with cardiology, Dr Aundra Dubin, Dr Bing Neighbors with dermatology, Dr Altamese Cabal Aged out of colonoscopy  . Moderate aortic stenosis    a. 08/2014 Echo: EF 55-60%, no rwma, Gr 1 DD, mod AS, mildly dil asc Ao, mild MR, sev dil LA.  [name] PAF (paroxysmal atrial fibrillation) ([place])    a. Dx 08/2014;  b. CHA2DS2VASc = 4 - decision made to withold [place] 2/2 unsteady gait and syncope.  . Sigmoid diverticulitis   . Syncope    a. 08/2014 - ? orthostatic    Past Surgical History:  Procedure Laterality Date  . COLONOSCOPY    . CORONARY ARTERY BYPASS GRAFT    . HIP ARTHROPLASTY Left 10/14/2015   Procedure: LEFT HIP HEMI ARTHROPLASTY;  Surgeon: [name], MD;  Location: [place];  Service: Orthopedics;  Laterality: Left;  . SHOULDER SURGERY     x2    There were no vitals filed for this visit.      Subjective Assessment - 06/01/16 0805    Subjective Pt reporting  some difficulty coordinating the PWR! step moves at home.   Patient Stated Goals "Improve balance and decrease likelihood for falls"   Currently in Pain? No/denies                   [facility] Adult PT Treatment/Exercise - 06/01/16 0800      Knee/Hip Exercises: Aerobic   Nustep lvl 6 x 6'           PWR [facility]) - 06/01/16 0800    PWR! exercises Functional moves   PWR! Step Through Forward/Back Fwd (lg step + wt shift) x10, Back (lg step + wt shift) x15; Combined fwd/back x15; B Side-stepping x15          Balance Exercises - 06/01/16 0800      OTAGO PROGRAM   Backwards Walking Support   Walking and Turning Around No assistive device   Sideways Walking No assistive device   Tandem Stance 10 seconds, support  working on weaning UE support   Tandem Walk Support   One Leg Stand 10 seconds, support   Heel Walking Support   Toe Walk Support   Heel Toe Walking Backward --  Level C             PT Short Term Goals - 05/05/16 2706  PT SHORT TERM GOAL #1   Title Independent with initial strengthening HEP by 04/30/16   Status Achieved     PT SHORT TERM GOAL #2   Title TUG < 13.5 sec to decrease fall risk by 05/07/16   Status Achieved           PT Long Term Goals - 05/19/16 0804      PT LONG TERM GOAL #1   Title Independent with balance HEP or other advanced HEP as indicated by 06/04/16   Status Partially Met  Met for current HEP     PT LONG TERM GOAL #2   Title Gait speed >/= 2.8 ft/sec with or w/o SPC for improved safety with community ambulation by 06/04/16   Status Achieved     PT LONG TERM GOAL #3   Title Berg balance >/= 48/56 to decrease risk for falls by 06/04/16   Status On-going  Currently 40/56     PT LONG TERM GOAL #4   Title FGA >/= 19/30 to improve gait stability and reduce fall risk by 06/04/16   Status On-going  Currently 13/30               Plan - 06/01/16 0807    Clinical Impression Statement Reviewed and clarified PWR!  Moves Walking Program using UE support on counter with pt acknowledging better understanding after review. Also reviewed high level OTAGO program activities and discussed how to progress weaning from UE as appropriate. Discussed readiness to transition to HEP, with pt feeling like he will be ready after 1 further visit.   Clinical Impairments Affecting Rehab Potential L hip fracture s/p fall in April 2017 - L hip hemiarthroplasty 10/14/15, HTN, afib, syncope, orthostatic hypotension   PT Treatment/Interventions Patient/family education;Neuromuscular re-education;Balance training;Vestibular;Therapeutic exercise;Therapeutic activities;Functional mobility training;Gait training;ADLs/Self Care Home Management   PT Next Visit Plan Goal assessment to determine readiness for D/C vs need for recert   Consulted and Agree with Plan of Care Patient      Patient will benefit from skilled therapeutic intervention in order to improve the following deficits and impairments:  Decreased balance, Decreased coordination, Difficulty walking, Abnormal gait, Postural dysfunction, Decreased strength, Decreased activity tolerance, Decreased endurance, Impaired vision/preception, Impaired perceived functional ability, Decreased safety awareness  Visit Diagnosis: Unsteadiness on feet  Unspecified lack of coordination  Other abnormalities of gait and mobility  Difficulty in walking, not elsewhere classified     Problem List Patient Active Problem List   Diagnosis Date Noted  . Fall   . Closed left hip fracture ([place]) 10/13/2015  . Medicare annual wellness visit, subsequent 03/16/2015  . Paroxysmal atrial fibrillation ([place]) 09/26/2014  . Orthostatic hypotension 09/15/2014  . Syncope   . Head trauma 09/14/2014  . Alteration in anticoagulation 09/14/2014  . Tinnitus 03/21/2014  . Epigastric pain 02/01/2013  . Gait instability 01/01/2013  . Disequilibrium 04/03/2012  . Hyperglycemia 09/13/2011  . Aortic  stenosis 03/10/2011  . CAD, ARTERY BYPASS GRAFT 02/23/2010  . Hyperlipidemia 07/04/2008  . Essential hypertension 07/04/2008  . Coronary atherosclerosis 07/04/2008  . RHINITIS 07/04/2008  . BENIGN PROSTATIC HYPERTROPHY, WITH OBSTRUCTION 07/04/2008    Percival Spanish, PT, MPT 06/01/2016, 8:46 AM  [facility] [place], [place], [address] Phone: [phone]   Fax:  [phone]  Name: Justin [name] MRN: [id number] Date of Birth: September 17, 1928

## 2016-06-04 ENCOUNTER — Ambulatory Visit: Payer: PPO | Admitting: Physical Therapy

## 2016-06-04 DIAGNOSIS — R2681 Unsteadiness on feet: Secondary | ICD-10-CM

## 2016-06-04 DIAGNOSIS — R279 Unspecified lack of coordination: Secondary | ICD-10-CM

## 2016-06-04 DIAGNOSIS — R2689 Other abnormalities of gait and mobility: Secondary | ICD-10-CM

## 2016-06-04 DIAGNOSIS — R262 Difficulty in walking, not elsewhere classified: Secondary | ICD-10-CM

## 2016-06-04 NOTE — Therapy (Signed)
[facility] [address]  [place], [place], [address] Phone: [phone]   Fax:  [phone]  Physical Therapy Treatment  Patient Details  Name: Justin [name] MRN: [id number] Date of Birth: September 15, 1928 Referring Provider: Ames Coupe, DO  Encounter Date: 06/04/2016      PT End of Session - 06/04/16 0925    Visit Number 14   Number of Visits 16   Date for PT Re-Evaluation 06/04/16   Authorization Type [employer] (follow Medicare guidelines)   PT Start Time 0925   PT Stop Time 1015   PT Time Calculation (min) 50 min   Activity Tolerance Patient tolerated treatment well   Behavior During Therapy [facility] for tasks assessed/performed      Past Medical History:  Diagnosis Date  . Basal cell carcinoma   . BPH (benign prostatic hypertrophy)   . CAD (coronary artery disease)    a. H/o MI;  b. s/p CABG  . Disequilibrium 04/03/2012  . HTN (hypertension)   . Hyperlipidemia   . Internal hemorrhoids   . Medicare annual wellness visit, subsequent 03/16/2015   Follows with cardiology, Dr Aundra Dubin, Dr Bing Neighbors with dermatology, Dr Altamese Cabal Aged out of colonoscopy  . Moderate aortic stenosis    a. 08/2014 Echo: EF 55-60%, no rwma, Gr 1 DD, mod AS, mildly dil asc Ao, mild MR, sev dil LA.  [name] PAF (paroxysmal atrial fibrillation) ([place])    a. Dx 08/2014;  b. CHA2DS2VASc = 4 - decision made to withold [place] 2/2 unsteady gait and syncope.  . Sigmoid diverticulitis   . Syncope    a. 08/2014 - ? orthostatic    Past Surgical History:  Procedure Laterality Date  . COLONOSCOPY    . CORONARY ARTERY BYPASS GRAFT    . HIP ARTHROPLASTY Left 10/14/2015   Procedure: LEFT HIP HEMI ARTHROPLASTY;  Surgeon: [name], MD;  Location: [facility];  Service: Orthopedics;  Laterality: Left;  . SHOULDER SURGERY     x2    There were no vitals filed for this visit.      Subjective Assessment - 06/04/16 0928    Subjective Pt reports  "slow waking up this morning".   Patient Stated Goals "Improve balance and decrease likelihood for falls"   Currently in Pain? No/denies            [facility] PT Assessment - 06/04/16 0925      Assessment   Medical Diagnosis Poor balance   Referring Provider Ames Coupe, DO   Next MD Visit none for Dr. Nani Ravens; 06/04/16 with [name], MD (PCP)     Observation/Other Assessments   Focus on Therapeutic Outcomes (FOTO)  Neuromuscular disorder 70% (30% limitation)     Strength   Right Hip Flexion 4+/5   Right Hip Extension 4/5   Right Hip ABduction 5/5   Right Hip ADduction 5/5   Left Hip Flexion 4+/5   Left Hip Extension 4/5   Left Hip ABduction 5/5   Left Hip ADduction 5/5   Right Knee Flexion 5/5   Left Knee Flexion 5/5   Left Knee Extension 5/5   Right Ankle Dorsiflexion 4+/5   Right Ankle Plantar Flexion 5/5   Right Ankle Inversion 4+/5   Right Ankle Eversion 4+/5   Left Ankle Dorsiflexion 4+/5   Left Ankle Plantar Flexion 5/5   Left Ankle Inversion 4+/5   Left Ankle Eversion 5/5     Standardized Balance Assessment  Standardized Balance Assessment Berg Balance Test;Timed Up and Go Test;10 meter walk test   10 Meter Walk 3.57 ft/sec  9.19" w/o AD; 3.56 ft/sec - 9.22" w/ SPC     Berg Balance Test   Sit to Stand Able to stand without using hands and stabilize independently   Standing Unsupported Able to stand safely 2 minutes   Sitting with Back Unsupported but Feet Supported on Floor or Stool Able to sit safely and securely 2 minutes   Stand to Sit Sits safely with minimal use of hands   Transfers Able to transfer safely, minor use of hands   Standing Unsupported with Eyes Closed Able to stand 10 seconds safely   Standing Ubsupported with Feet Together Able to place feet together independently and stand 1 minute safely   From Standing, Reach Forward with Outstretched Arm Can reach forward >12 cm safely (5")   From Standing Position, Pick up Object from  Floor Able to pick up shoe safely and easily   From Standing Position, Turn to Look Behind Over each Shoulder Looks behind one side only/other side shows less weight shift   Turn 360 Degrees Able to turn 360 degrees safely one side only in 4 seconds or less   Standing Unsupported, Alternately Place Feet on Step/Stool Able to stand independently and complete 8 steps >20 seconds   Standing Unsupported, One Foot in Front Able to plae foot ahead of the other independently and hold 30 seconds   Standing on One Leg Tries to lift leg/unable to hold 3 seconds but remains standing independently   Total Score 48   Berg comment: 46-51 moderate fall risk (>50%)      Timed Up and Go Test   TUG Normal TUG   Normal TUG (seconds) 9.5  w/o AD); 9.53" w/ SPC     Functional Gait  Assessment   Gait Level Surface Walks 20 ft in less than 5.5 sec, no assistive devices, good speed, no evidence for imbalance, normal gait pattern, deviates no more than 6 in outside of the 12 in walkway width.   Change in Gait Speed Able to smoothly change walking speed without loss of balance or gait deviation. Deviate no more than 6 in outside of the 12 in walkway width.   Gait with Horizontal Head Turns Performs head turns smoothly with slight change in gait velocity (eg, minor disruption to smooth gait path), deviates 6-10 in outside 12 in walkway width, or uses an assistive device.   Gait with Vertical Head Turns Performs task with moderate change in gait velocity, slows down, deviates 10-15 in outside 12 in walkway width but recovers, can continue to walk.   Gait and Pivot Turn Pivot turns safely within 3 sec and stops quickly with no loss of balance.   Step Over Obstacle Is able to step over one shoe box (4.5 in total height) but must slow down and adjust steps to clear box safely. May require verbal cueing.   Gait with Narrow Base of Support Ambulates less than 4 steps heel to toe or cannot perform without assistance.   Gait  with Eyes Closed Cannot walk 20 ft without assistance, severe gait deviations or imbalance, deviates greater than 15 in outside 12 in walkway width or will not attempt task.   Ambulating Backwards Walks 20 ft, slow speed, abnormal gait pattern, evidence for imbalance, deviates 10-15 in outside 12 in walkway width.   Steps Alternating feet, must use rail.   Total Score 16  FGA comment: < 19 = high risk fall                     [facility] Adult PT Treatment/Exercise - 06/12/2016 0925      Knee/Hip Exercises: Aerobic   Nustep lvl 5 x 8'                  PT Short Term Goals - 05/05/16 0804      PT SHORT TERM GOAL #1   Title Independent with initial strengthening HEP by 04/30/16   Status Achieved     PT SHORT TERM GOAL #2   Title TUG < 13.5 sec to decrease fall risk by 05/07/16   Status Achieved           PT Long Term Goals - 06/12/16 0929      PT LONG TERM GOAL #1   Title Independent with balance HEP or other advanced HEP as indicated by Jun 12, 2016   Status Achieved     PT LONG TERM GOAL #2   Title Gait speed >/= 2.8 ft/sec with or w/o SPC for improved safety with community ambulation by June 12, 2016   Status Achieved     PT LONG TERM GOAL #3   Title Berg balance >/= 48/56 to decrease risk for falls by 06/12/16   Status Achieved     PT LONG TERM GOAL #4   Title FGA >/= 19/30 to improve gait stability and reduce fall risk by 12-Jun-2016   Status Not Met  Currently 16/30               Plan - 06-12-16 0929    Clinical Impression Statement Pt has demonstrated good progress with PT with improved overall B LE strength as well as improved balance and gait speed per standardized testing (refer to above assessment). All goals met except FGA only 16/30 (goal 19/30). Pt is independent with HEP for strengthening and OTAGO fall prevention program. Pt pleased with progress and feels ready to transition to HEP, therefore wil proceed with discharge from PT for this episode.    Clinical Impairments Affecting Rehab Potential L hip fracture s/p fall in April 2017 - L hip hemiarthroplasty 10/14/15, HTN, afib, syncope, orthostatic hypotension   PT Treatment/Interventions Patient/family education;Neuromuscular re-education;Balance training;Vestibular;Therapeutic exercise;Therapeutic activities;Functional mobility training;Gait training;ADLs/Self Care Home Management   PT Next Visit Plan Discharge   Consulted and Agree with Plan of Care Patient      Patient will benefit from skilled therapeutic intervention in order to improve the following deficits and impairments:  Decreased balance, Decreased coordination, Difficulty walking, Abnormal gait, Postural dysfunction, Decreased strength, Decreased activity tolerance, Decreased endurance, Impaired vision/preception, Impaired perceived functional ability, Decreased safety awareness  Visit Diagnosis: Unsteadiness on feet  Unspecified lack of coordination  Other abnormalities of gait and mobility  Difficulty in walking, not elsewhere classified       G-Codes - Jun 12, 2016 1022    Functional Assessment Tool Used Berg = 48/56 (14.3% limitation) + clinical judgement   Functional Limitation Mobility: Walking and moving around   Mobility: Walking and Moving Around Goal Status [phone]) At least 20 percent but less than 40 percent impaired, limited or restricted   Mobility: Walking and Moving Around Discharge Status [phone]) At least 20 percent but less than 40 percent impaired, limited or restricted      Problem List Patient Active Problem List   Diagnosis Date Noted  . Fall   . Closed left hip fracture ([place]) 10/13/2015  . Medicare  annual wellness visit, subsequent 03/16/2015  . Paroxysmal atrial fibrillation ([place]) 09/26/2014  . Orthostatic hypotension 09/15/2014  . Syncope   . Head trauma 09/14/2014  . Alteration in anticoagulation 09/14/2014  . Tinnitus 03/21/2014  . Epigastric pain 02/01/2013  . Gait instability  01/01/2013  . Disequilibrium 04/03/2012  . Hyperglycemia 09/13/2011  . Aortic stenosis 03/10/2011  . CAD, ARTERY BYPASS GRAFT 02/23/2010  . Hyperlipidemia 07/04/2008  . Essential hypertension 07/04/2008  . Coronary atherosclerosis 07/04/2008  . RHINITIS 07/04/2008  . BENIGN PROSTATIC HYPERTROPHY, WITH OBSTRUCTION 07/04/2008    Percival Spanish, PT, MPT 06/04/2016, 10:25 AM  [facility] [address]  [place], [place], [address] Phone: [phone]   Fax:  [phone]  Name: COOPER MORONEY MRN: [id number] Date of Birth: 11-23-1928   PHYSICAL THERAPY DISCHARGE SUMMARY  Visits from Start of Care: 14  Current functional level related to goals / functional outcomes:   Refer to above clinical impression   Remaining deficits:   As above. Pt to continue with HEP & OTAGO fall prevention program at least 2-3x/wk.   Education / Equipment:    HEP & OTAGO Fall Prevention Program  Plan: Patient agrees to discharge.  Patient goals were partially met. Patient is being discharged due to being pleased with the current functional level.  ?????    Percival Spanish, PT, MPT 06/04/16, 10:27 AM  [facility] [address]  [place], [place], [address] Phone: [phone]   Fax:  [phone]

## 2016-06-07 DIAGNOSIS — L820 Inflamed seborrheic keratosis: Secondary | ICD-10-CM | POA: Diagnosis not present

## 2016-06-07 DIAGNOSIS — L570 Actinic keratosis: Secondary | ICD-10-CM | POA: Diagnosis not present

## 2016-06-07 DIAGNOSIS — Z85828 Personal history of other malignant neoplasm of skin: Secondary | ICD-10-CM | POA: Diagnosis not present

## 2016-06-07 DIAGNOSIS — D485 Neoplasm of uncertain behavior of skin: Secondary | ICD-10-CM | POA: Diagnosis not present

## 2016-06-07 DIAGNOSIS — Z08 Encounter for follow-up examination after completed treatment for malignant neoplasm: Secondary | ICD-10-CM | POA: Diagnosis not present

## 2016-06-07 DIAGNOSIS — C44519 Basal cell carcinoma of skin of other part of trunk: Secondary | ICD-10-CM | POA: Diagnosis not present

## 2016-06-07 DIAGNOSIS — D0471 Carcinoma in situ of skin of right lower limb, including hip: Secondary | ICD-10-CM | POA: Diagnosis not present

## 2016-06-14 ENCOUNTER — Encounter: Payer: PPO | Admitting: Physical Therapy

## 2016-06-17 ENCOUNTER — Telehealth: Payer: Self-pay | Admitting: Neurology

## 2016-06-17 MED ORDER — DONEPEZIL HCL 10 MG PO TABS: 10.0000 mg | ORAL_TABLET | Freq: Every day | ORAL | 1 refills | Status: DC

## 2016-06-17 NOTE — Telephone Encounter (Signed)
Justin [name] [birth year]. The medication Donepezil he needs a refill on it as well as an increase on the dosage. He uses [facility] on [id number] in [place]. His  # [id number]. Thank  you

## 2016-06-17 NOTE — Telephone Encounter (Signed)
Patient taking Aricept 5 mg for one month. RX for 10 mg sent to [facility].

## 2016-06-22 ENCOUNTER — Encounter: Payer: PPO | Admitting: Family Medicine

## 2016-06-23 DIAGNOSIS — R972 Elevated prostate specific antigen [PSA]: Secondary | ICD-10-CM | POA: Diagnosis not present

## 2016-06-23 DIAGNOSIS — N401 Enlarged prostate with lower urinary tract symptoms: Secondary | ICD-10-CM | POA: Diagnosis not present

## 2016-06-23 DIAGNOSIS — N481 Balanitis: Secondary | ICD-10-CM | POA: Diagnosis not present

## 2016-06-23 DIAGNOSIS — R310 Gross hematuria: Secondary | ICD-10-CM | POA: Diagnosis not present

## 2016-06-30 NOTE — Progress Notes (Signed)
Subjective:   Justin [name] is a 83 y.o. male who presents for Medicare Annual/Subsequent preventive examination.  Review of Systems:  No ROS.  Medicare Wellness Visit.  Cardiac Risk Factors include: advanced age (>64men, >59 women);dyslipidemia;male gender;hypertension  Sleep patterns: falls asleep easily, has frequent nighttime awakenings, gets up 1 times nightly to void and 6-7 hours nightly. Frequent daytime napping. Sometimes snores at night.  Home Safety/Smoke Alarms: Feels safe in home. Smoke alarms in place.   Living environment; residence and Firearm Safety: Lives at [facility] w/ wife. 1-story house/ trailer, number of outside stairs: 0, number of inside stairs: 0, walk-in shower, equipment: Cane, Type: [place], no firearms. Seat Belt Safety/Bike Helmet: Wears seat belt.   Counseling:   Eye Exam- Follows w/ Dr. Herbert Deaner yearly. Dental- Follows w/ dentist yearly.  Male:   CCS- Aged out.     PSA- Followed by [facility]-Dr. Junious Silk. Lab Results  Component Value Date   PSA 10.24 (H) 02/06/2009   PSA 7.82 01/26/2008       Objective:    Vitals: BP (!) 148/84 (BP Location: Right Arm, Patient Position: Sitting, Cuff Size: Normal)   Pulse 74   Resp 14   Ht 5\' 6"  (1.676 m)   Wt 149 lb 12.8 oz (67.9 kg)   SpO2 97%   BMI 24.18 kg/m   Body mass index is 24.18 kg/m.  Tobacco History  Smoking Status  . Former Smoker  . Packs/day: 0.50  . Years: 15.00  . Quit date: 06/29/1971  Smokeless Tobacco  . Never Used     Counseling given: Not Answered   Past Medical History:  Diagnosis Date  . Basal cell carcinoma   . BPH (benign prostatic hypertrophy)   . CAD (coronary artery disease)    a. H/o MI;  b. s/p CABG  . Disequilibrium 04/03/2012  . HTN (hypertension)   . Hyperlipidemia   . Internal hemorrhoids   . Medicare annual wellness visit, subsequent 03/16/2015   Follows with cardiology, Dr Aundra Dubin, Dr Bing Neighbors with dermatology, Dr Altamese Cabal  Aged out of colonoscopy  . Moderate aortic stenosis    a. 08/2014 Echo: EF 55-60%, no rwma, Gr 1 DD, mod AS, mildly dil asc Ao, mild MR, sev dil LA.  [name] PAF (paroxysmal atrial fibrillation) ([place])    a. Dx 08/2014;  b. CHA2DS2VASc = 4 - decision made to withold [place] 2/2 unsteady gait and syncope.  . Sigmoid diverticulitis   . Syncope    a. 08/2014 - ? orthostatic   Past Surgical History:  Procedure Laterality Date  . COLONOSCOPY    . CORONARY ARTERY BYPASS GRAFT    . HIP ARTHROPLASTY Left 10/14/2015   Procedure: LEFT HIP HEMI ARTHROPLASTY;  Surgeon: [name], MD;  Location: [facility];  Service: Orthopedics;  Laterality: Left;  . SHOULDER SURGERY     x2   Family History  Problem Relation Age of Onset  . Coronary artery disease Father   . Hyperlipidemia Father   . Heart attack Father   . Alcohol abuse Father   . Bipolar disorder Mother   . Heart disease Mother   . Alcohol abuse Brother   . Cancer Brother   . Cancer Maternal Grandfather     oral cancer, chew tobacco  . Cancer Sister   . Glaucoma Sister   . Alcohol abuse     History  Sexual Activity  . Sexual activity: No    Comment: lives with wife, retired Psychologist, educational  administration, no dietary restrictions    Outpatient Encounter Prescriptions as of 07/01/2016  Medication Sig  . acetaminophen (TYLENOL) 325 MG tablet Take 1-2 tablets (325-650 mg total) by mouth every 6 (six) hours as needed for mild pain or moderate pain.  [name] apixaban (ELIQUIS) 5 MG TABS tablet Take 1 tablet (5 mg total) by mouth 2 (two) times daily.  [name] CARTIA XT 120 MG 24 hr capsule TAKE ONE CAPSULE BY MOUTH ONCE DAILY  . Cholecalciferol (VITAMIN D3) 5000 units CAPS Take 1 capsule by mouth daily.  [name] donepezil (ARICEPT) 10 MG tablet Take 1 tablet (10 mg total) by mouth at bedtime.  . lovastatin (MEVACOR) 20 MG tablet TAKE ONE TABLET BY MOUTH ONCE DAILY AT BEDTIME  . Multiple Vitamin (MULTIVITAMIN) capsule Take 0.5 capsules by mouth daily.   .  multivitamin-lutein (OCUVITE-LUTEIN) CAPS Take 1 capsule by mouth daily.   . Probiotic Product (PROBIOTIC DAILY PO) Take 1 capsule by mouth daily.  [name] dutasteride (AVODART) 0.5 MG capsule Take 0.5 mg by mouth daily.  . hydrochlorothiazide (MICROZIDE) 12.5 MG capsule Take 1 capsule (12.5 mg total) by mouth daily. (Patient not taking: Reported on 07/01/2016)  . [DISCONTINUED] cholecalciferol (VITAMIN D) 1000 UNITS tablet Take 1,000 Units by mouth daily.    No facility-administered encounter medications on file as of 07/01/2016.     Activities of Daily Living In your present state of health, do you have any difficulty performing the following activities: 07/01/2016 03/15/2016  Hearing? N Y  Vision? N N  Difficulty concentrating or making decisions? N Y  Walking or climbing stairs? N Y  Dressing or bathing? Y N  Doing errands, shopping? N N  Preparing Food and eating ? N -  Using the Toilet? N -  In the past six months, have you accidently leaked urine? Y -  Do you have problems with loss of bowel control? N -  Managing your Medications? N -  Managing your Finances? N -  Housekeeping or managing your Housekeeping? N -  Some recent data might be hidden    Patient Care Team: Mosie Lukes, MD as PCP - General (Family Medicine) Festus Aloe, MD as Consulting Physician (Urology) Monna Fam, MD as Consulting Physician (Ophthalmology) Pieter Partridge, DO as Consulting Physician (Neurology) Lelon Perla, MD as Consulting Physician (Cardiology)   Assessment:    Physical assessment deferred to PCP.  Exercise Activities and Dietary recommendations Current Exercise Habits: Structured exercise class, Type of exercise: strength training/weights (elliptical), Time (Minutes): 30, Frequency (Times/Week): 3, Weekly Exercise (Minutes/Week): 90, Intensity: Moderate  Diet (meal preparation, eat out, water intake, caffeinated beverages, dairy products, fruits and vegetables): in general, a "healthy"  diet  , well balanced, on average, 2 meals per day. Usually eats in the dining hall at [employer]. Wife prepares meals otherwise or has delivery. Typically eats salad, meat, and vegetable. Trying to increase water intake. Drinks tomato juice in the morning. 2-3 cups decaf coffee daily.  Goals    . Keep brain active          Continue doing brain stimulating activities (puzzles, reading, adult coloring books, staying active) to keep memory sharp.       Fall Risk Fall Risk  07/01/2016 05/25/2016 05/03/2016 03/15/2016 02/28/2015  Falls in the past year? Yes Yes Yes Yes No  Number falls in past yr: 2 or more 2 or more 2 or more 2 or more -  Injury with Fall? Yes Yes Yes Yes -  Risk  Factor Category  High Fall Risk High Fall Risk - - -  Risk for fall due to : History of fall(s);Impaired balance/gait - - - -  Follow up - Falls evaluation completed - - -   Depression Screen PHQ 2/9 Scores 07/01/2016 03/15/2016 02/28/2015  PHQ - 2 Score 4 0 0  PHQ- 9 Score 10 - -    Cognitive Function MMSE - Mini Mental State Exam 05/25/2016  Orientation to time 5  Orientation to Place 4  Registration 3  Attention/ Calculation 5  Recall 1  Language- name 2 objects 2  Language- repeat 1  Language- follow 3 step command 3  Language- read & follow direction 1  Write a sentence 1  Copy design 1  Total score 27        Immunization History  Administered Date(s) Administered  . Influenza Split 04/03/2012  . Influenza Whole 06/10/2008, 03/28/2009  . Influenza, High Dose Seasonal PF 02/28/2015  . Influenza-Unspecified 03/29/2014, 03/30/2016  . Pneumococcal Conjugate-13 01/01/2015  . Pneumococcal Polysaccharide-23 01/16/2008  . Td 07/07/1999, 05/14/2009  . Zoster 02/11/2004, 04/26/2006   Screening Tests Health Maintenance  Topic Date Due  . TETANUS/TDAP  05/15/2019  . INFLUENZA VACCINE  Addressed  . ZOSTAVAX  Completed  . PNA vac Low Risk Adult  Completed      Plan:   Follow-up w/ PCP as  scheduled. Bring a copy of your advance directives to your next office visit.  Positive depression screening. Pt declines counseling services, has some interest in low-dose antidepressant-follow up scheduled w/ PCP.  During the course of the visit the patient was educated and counseled about the following appropriate screening and preventive services:   Vaccines to include Pneumoccal, Influenza, Hepatitis B, Td, Zostavax, HCV  Cardiovascular Disease  Diabetes screening  Prostate Cancer Screening  Glaucoma screening  Nutrition counseling     Patient Instructions (the written plan) was given to the patient.    Dorrene German, RN  07/01/2016

## 2016-06-30 NOTE — Progress Notes (Signed)
Pre visit review using our clinic review tool, if applicable. No additional management support is needed unless otherwise documented below in the visit note. 

## 2016-07-01 ENCOUNTER — Encounter: Payer: Self-pay | Admitting: *Deleted

## 2016-07-01 ENCOUNTER — Ambulatory Visit (INDEPENDENT_AMBULATORY_CARE_PROVIDER_SITE_OTHER): Payer: PPO | Admitting: *Deleted

## 2016-07-01 VITALS — BP 148/84 | HR 74 | Resp 14 | Ht 66.0 in | Wt 149.8 lb

## 2016-07-01 DIAGNOSIS — R2681 Unsteadiness on feet: Secondary | ICD-10-CM

## 2016-07-01 DIAGNOSIS — Z0000 Encounter for general adult medical examination without abnormal findings: Secondary | ICD-10-CM

## 2016-07-01 NOTE — Patient Instructions (Addendum)
Justin [name] , Thank you for taking time to come for your Medicare Wellness Visit. I appreciate your ongoing commitment to your health goals. Please review the following plan we discussed and let me know if I can assist you in the future.   These are the goals we discussed: Goals    . Keep brain active          Continue doing brain stimulating activities (puzzles, reading, adult coloring books, staying active) to keep memory sharp.        This is a list of the screening recommended for you and due dates:  Health Maintenance  Topic Date Due  . Tetanus Vaccine  05/15/2019  . Flu Shot  Addressed  . Shingles Vaccine  Completed  . Pneumonia vaccines  Completed   Fall Prevention in the Home Falls can cause injuries and can affect people from all age groups. There are many simple things that you can do to make your home safe and to help prevent falls. What can I do on the outside of my home?  Regularly repair the edges of walkways and driveways and fix any cracks.  Remove high doorway thresholds.  Trim any shrubbery on the main path into your home.  Use bright outdoor lighting.  Clear walkways of debris and clutter, including tools and rocks.  Regularly check that handrails are securely fastened and in good repair. Both sides of any steps should have handrails.  Install guardrails along the edges of any raised decks or porches.  Have leaves, snow, and ice cleared regularly.  Use sand or salt on walkways during winter months.  In the garage, clean up any spills right away, including grease or oil spills. What can I do in the bathroom?  Use night lights.  Install grab bars by the toilet and in the tub and shower. Do not use towel bars as grab bars.  Use non-skid mats or decals on the floor of the tub or shower.  If you need to sit down while you are in the shower, use a plastic, non-slip stool.  Keep the floor dry. Immediately clean up any water that spills on the  floor.  Remove soap buildup in the tub or shower on a regular basis.  Attach bath mats securely with double-sided non-slip rug tape.  Remove throw rugs and other tripping hazards from the floor. What can I do in the bedroom?  Use night lights.  Make sure that a bedside light is easy to reach.  Do not use oversized bedding that drapes onto the floor.  Have a firm chair that has side arms to use for getting dressed.  Remove throw rugs and other tripping hazards from the floor. What can I do in the kitchen?  Clean up any spills right away.  Avoid walking on wet floors.  Place frequently used items in easy-to-reach places.  If you need to reach for something above you, use a sturdy step stool that has a grab bar.  Keep electrical cables out of the way.  Do not use floor polish or wax that makes floors slippery. If you have to use wax, make sure that it is non-skid floor wax.  Remove throw rugs and other tripping hazards from the floor. What can I do in the stairways?  Do not leave any items on the stairs.  Make sure that there are handrails on both sides of the stairs. Fix handrails that are broken or loose. Make sure that handrails are as  long as the stairways.  Check any carpeting to make sure that it is firmly attached to the stairs. Fix any carpet that is loose or worn.  Avoid having throw rugs at the top or bottom of stairways, or secure the rugs with carpet tape to prevent them from moving.  Make sure that you have a light switch at the top of the stairs and the bottom of the stairs. If you do not have them, have them installed. What are some other fall prevention tips?  Wear closed-toe shoes that fit well and support your feet. Wear shoes that have rubber soles or low heels.  When you use a stepladder, make sure that it is completely opened and that the sides are firmly locked. Have someone hold the ladder while you are using it. Do not climb a closed  stepladder.  Add color or contrast paint or tape to grab bars and handrails in your home. Place contrasting color strips on the first and last steps.  Use mobility aids as needed, such as canes, walkers, scooters, and crutches.  Turn on lights if it is dark. Replace any light bulbs that burn out.  Set up furniture so that there are clear paths. Keep the furniture in the same spot.  Fix any uneven floor surfaces.  Choose a carpet design that does not hide the edge of steps of a stairway.  Be aware of any and all pets.  Review your medicines with your healthcare provider. Some medicines can cause dizziness or changes in blood pressure, which increase your risk of falling. Talk with your health care provider about other ways that you can decrease your risk of falls. This may include working with a physical therapist or trainer to improve your strength, balance, and endurance. This information is not intended to replace advice given to you by your health care provider. Make sure you discuss any questions you have with your health care provider. Document Released: 06/04/2002 Document Revised: 11/11/2015 Document Reviewed: 07/19/2014 Elsevier Interactive Patient Education  2017 [employer].

## 2016-07-01 NOTE — Progress Notes (Signed)
RN AWV note reviewed. Agree with documention and plan. 

## 2016-07-01 NOTE — Assessment & Plan Note (Signed)
Ongoing for pt. Walks w/ cane. Has done PT in the past both w/ [facility] and at [facility]. He plans to restart his home rehab exercises within the next few weeks. He is not interested in any additional formal PT at this time.

## 2016-07-05 ENCOUNTER — Telehealth: Payer: Self-pay | Admitting: Cardiology

## 2016-07-05 NOTE — Telephone Encounter (Signed)
Spoke to patient's wife. She notes he's had a history of A Fib, no recent episodes but started having noticeable irregularity over the weekend. He's not SOB or weak at rest, but does identify weakness when ambulating and can feel his HR go up.  BPs are at baseline when checked (readings this AM 112-119/80-82) w HR variability between 80 and 115. Recent med change -- donazepil, which was started by Dr. Tomi Likens. He is taking this at night as prescribed.  He will see Suanne Marker tomorrow at 8:30am. Aware of appt info. Advised to seek urgent evaluation if SOB, weak, dizzy at rest or if HR sustained >120. Routed to [employer] for any further recommendations.

## 2016-07-05 NOTE — Telephone Encounter (Signed)
New Message   Pt has been experiencing Afib through the weekend. Appt made with [name] for tomorrow.

## 2016-07-06 ENCOUNTER — Ambulatory Visit (INDEPENDENT_AMBULATORY_CARE_PROVIDER_SITE_OTHER): Payer: PPO | Admitting: Physician Assistant

## 2016-07-06 ENCOUNTER — Encounter: Payer: Self-pay | Admitting: Physician Assistant

## 2016-07-06 ENCOUNTER — Ambulatory Visit: Payer: PPO | Admitting: Physician Assistant

## 2016-07-06 VITALS — BP 169/78 | HR 58 | Ht 65.0 in | Wt 152.4 lb

## 2016-07-06 DIAGNOSIS — I480 Paroxysmal atrial fibrillation: Secondary | ICD-10-CM | POA: Diagnosis not present

## 2016-07-06 DIAGNOSIS — Z7901 Long term (current) use of anticoagulants: Secondary | ICD-10-CM | POA: Diagnosis not present

## 2016-07-06 DIAGNOSIS — Z79899 Other long term (current) drug therapy: Secondary | ICD-10-CM

## 2016-07-06 DIAGNOSIS — I10 Essential (primary) hypertension: Secondary | ICD-10-CM | POA: Diagnosis not present

## 2016-07-06 LAB — BASIC METABOLIC PANEL
BUN: 15 mg/dL (ref 7–25)
CALCIUM: 8.9 mg/dL (ref 8.6–10.3)
CO2: 24 mmol/L (ref 20–31)
Chloride: 102 mmol/L (ref 98–110)
Creat: 0.92 mg/dL (ref 0.70–1.11)
Glucose, Bld: 119 mg/dL — ABNORMAL HIGH (ref 65–99)
Potassium: 4.3 mmol/L (ref 3.5–5.3)
SODIUM: 137 mmol/L (ref 135–146)

## 2016-07-06 NOTE — Progress Notes (Signed)
Cardiology Office Note   Date:  07/06/2016   ID:  Justin [name], DOB [birth year], MRN [id number]  PCP:  [name], MD  Cardiologist: Dr McLean>> Dr Stanford Breed 05/07/2016  [name], PA-C   Chief Complaint  Patient presents with  . Follow-up  . Shortness of Breath    randomly.  . Dizziness    randomly.    History of Present Illness: Justin [name] is a 84 y.o. male with a history of PAF on Eliquis, CABG x 5 2000 w/ LIMA-LAD, SVG-OM1-OM3, SVG-D1, SVG-PDA. Hx HTN, HLD, BPH, mod AS, EF 50-55% echo 12/2015  01/08 phone notes not irreg HR>>appt made  Justin [name] presents for cardiology evaluation. He is here today with his wife.  He started Aricept at 5 mg qd, tolerated that but had problems with 10 mg qd. He was fatigued and had wome weakness. He has stopped the Aricept as of last pm.  He was started on Dutasteride 01/05 for BPH and had the GI/Urinary side effects he had with Proscar, so he stopped that on 01/07. He is still having some GI issues, but they are gradually improving.  01/07, problems with weakness and low BP, he had been incontinent and having diarrhea. His BP was up and down, he hydrated himself and the sx improved. However, in the process of checking his BP/HR, it was noted that his HR was irregular and rapid at times (up to 120+). His HR was noted to be irregular another time, but was only 62.   He does not feel the atrial fib. He occasionally has felt his heart racing, this was with exertion recently, he rests and gets better. He does not feel the PVCs.  HCTZ is used prn, he rarely takes it because he rarely has edema. He has not taken it in a couple of months.   Past Medical History:  Diagnosis Date  . Basal cell carcinoma   . BPH (benign prostatic hypertrophy)   . CAD (coronary artery disease) 1991   a. H/o MI w/ PTCA;  b. s/p CABG by Dr Roxan Hockey w/ LIMA-LAD, SVG-OM1-OM3, SVG-D1, SVG-PDA  . Disequilibrium 04/03/2012  . HTN (hypertension)   .  Hyperlipidemia   . Internal hemorrhoids   . Medicare annual wellness visit, subsequent 03/16/2015   Follows with cardiology, Dr Stanford Breed, Follows with dermatology, Dr Altamese Cabal. Aged out of colonoscopy  . Moderate aortic stenosis    a. 08/2014 Echo: EF 55-60%, no rwma, Gr 1 DD, mod AS, mildly dil asc Ao, mild MR, sev dil LA.  [name] PAF (paroxysmal atrial fibrillation) ([place])    a. Dx 08/2014;  b. CHA2DS2VASc = 4 - decision made to withold [place] 2/2 unsteady gait and syncope.  . Sigmoid diverticulitis   . Syncope    a. 08/2014 - ? orthostatic    Past Surgical History:  Procedure Laterality Date  . COLONOSCOPY    . CORONARY ARTERY BYPASS GRAFT  2000   Dr Roxan Hockey  LIMA-LAD, SVG-OM1-OM3, SVG-D1, SVG-PDA.   [name] HIP ARTHROPLASTY Left 10/14/2015   Procedure: LEFT HIP HEMI ARTHROPLASTY;  Surgeon: [name], MD;  Location: [facility];  Service: Orthopedics;  Laterality: Left;  . SHOULDER SURGERY     x2    Current Outpatient Prescriptions  Medication Sig Dispense Refill  . acetaminophen (TYLENOL) 325 MG tablet Take 1-2 tablets (325-650 mg total) by mouth every 6 (six) hours as needed for mild pain or moderate pain. 40 tablet 0  . apixaban (ELIQUIS) 5 MG  TABS tablet Take 1 tablet (5 mg total) by mouth 2 (two) times daily. 60 tablet 0  . CARTIA XT 120 MG 24 hr capsule TAKE ONE CAPSULE BY MOUTH ONCE DAILY 30 capsule 11  . Cholecalciferol (VITAMIN D3) 5000 units CAPS Take 1 capsule by mouth daily.    . hydrochlorothiazide (MICROZIDE) 12.5 MG capsule Take 1 capsule (12.5 mg total) by mouth daily. 90 capsule 3  . lovastatin (MEVACOR) 20 MG tablet TAKE ONE TABLET BY MOUTH ONCE DAILY AT BEDTIME 90 tablet 3  . Multiple Vitamin (MULTIVITAMIN) capsule Take 0.5 capsules by mouth daily.     . multivitamin-lutein (OCUVITE-LUTEIN) CAPS Take 1 capsule by mouth daily.     . Probiotic Product (PROBIOTIC DAILY PO) Take 1 capsule by mouth daily.     No current facility-administered medications for this visit.      Allergies:   Dutasteride; Finasteride; and Toradol [ketorolac tromethamine]    Social History:  The patient  reports that he quit smoking about 45 years ago. He has a 7.50 pack-year smoking history. He has never used smokeless tobacco. He reports that he drinks about 0.6 - 1.2 oz of alcohol per week . He reports that he does not use drugs.   Family History:  The patient's family history includes Alcohol abuse in his brother and father; Bipolar disorder in his mother; Cancer in his brother, maternal grandfather, and sister; Coronary artery disease in his father; Glaucoma in his sister; Heart attack in his father; Heart disease in his mother; Hyperlipidemia in his father.    ROS:  Please see the history of present illness. All other systems are reviewed and negative.    PHYSICAL EXAM: VS:  BP (!) 169/78   Pulse (!) 58   Ht 5\' 5"  (1.651 m)   Wt 152 lb 6.4 oz (69.1 kg)   BMI 25.36 kg/m  , BMI Body mass index is 25.36 kg/m. GEN: Well nourished, well developed, male in no acute distress  HEENT: normal for age  Neck: no JVD, no carotid bruit, no masses Cardiac: RRR; 2/6 murmur, no rubs, or gallops Respiratory:  clear to auscultation bilaterally, normal work of breathing GI: soft, nontender, nondistended, + BS MS: no deformity or atrophy; no edema; distal pulses are 2+ in all 4 extremities   Skin: warm and dry, pinpoint rash on abdomen Neuro:  Strength and sensation are intact Psych: euthymic mood, full affect   EKG:  EKG is ordered today. The ekg ordered today demonstrates sinus brady, HR 58 PVC seen, nonspecific ST/T wave changes  ECHO: 01/21/2016 - Left ventricle: Septal and apical hypokinesis. The cavity size   was moderately dilated. Wall thickness was increased in a pattern   of mild LVH. Systolic function was normal. The estimated ejection   fraction was in the range of 50% to 55%. Doppler parameters are   consistent with abnormal left ventricular relaxation (grade 1    diastolic dysfunction). - Aortic valve: fuse commisure between right and non coronary cusp.   There was moderate stenosis. Valve area (VTI): 1.2 cm^2. Valve   area (Vmax): 0.98 cm^2. Valve area (Vmean): 0.85 cm^2. - Mitral valve: Calcified annulus. Mildly thickened leaflets .   There was mild regurgitation. - Left atrium: The atrium was mildly dilated. - Atrial septum: No defect or patent foramen ovale was identified.   Recent Labs: 05/03/2016: ALT 13; TSH 4.34 05/14/2016: BUN 14; Creatinine, Ser 0.91; Hemoglobin 12.8; Platelets 333; Potassium 3.7; Sodium 137    Lipid Panel  Component Value Date/Time   CHOL 178 01/19/2016 1601   TRIG 81 01/19/2016 1601   HDL 102 01/19/2016 1601   CHOLHDL 1.7 01/19/2016 1601   VLDL 16 01/19/2016 1601   LDLCALC 60 01/19/2016 1601     Wt Readings from Last 3 Encounters:  07/06/16 152 lb 6.4 oz (69.1 kg)  07/01/16 149 lb 12.8 oz (67.9 kg)  05/25/16 146 lb (66.2 kg)     Other studies Reviewed: Additional studies/ records that were reviewed today include: Office notes, hospital records and testing.  ASSESSMENT AND PLAN:  1.  PAF: The episode with the elevated HR was most likely afib. The irregular HR at 62 may just have been PVCs. Cannot increase Cardizem or add BB due to resting bradycardia. Reassured pt that CVA is not an issue since he is on Eliquis. Also said that if his heart is skipping and the rate is normal, may just be PVCs. Pt and wife were reassured by this. They should call if he gets more frequent sx, but think in the setting of GI upset, may not indicate that he is spending a lot of time in afib.  2. Chronic anticoagulation: He is compliant with the Eliquis. He is not having any bleeding issues.   3. Side effects: he had lower GI side effects with the Avodart and Proscar, plus urinary problems. He is off the medications now, but with increased ectopy, will ck a BMET to make sure his electrolytes are ok.   4. Memory issues: at the  lower dose of Aricept, he did not have side effects. He is requested to call Dr Tomi Likens about stopping it, also recommended he restart the lower dose. However, he wishes to wait till he talks to Dr Tomi Likens.  5. HTN: BP has been up and down during acute illness. It is elevated now, but pt concerned about the problems with side effects he has had recently. He wishes Dr Stanford Breed or Dr Randel Pigg to adjust his meds, once his symptoms have improved.  Current medicines are reviewed at length with the patient today.  The patient has concerns regarding medicines. Concerns were addressed.  The following changes have been made:  no change  Labs/ tests ordered today include:   Orders Placed This Encounter  Procedures  . Basic metabolic panel  . EKG 12-Lead     Disposition:   FU with Dr Stanford Breed  Signed, [name], PA-C  07/06/2016 4:15 PM    [facility] Phone: [phone]; Fax: [phone]  This note was written with the assistance of speech recognition software. Please excuse any transcriptional errors.

## 2016-07-06 NOTE — Patient Instructions (Signed)
Medication Instructions:  WE WILL CALL YOU WITH ANY CHANGES   If you need a refill on your cardiac medications before your next appointment, please call your pharmacy.  Labwork: BMET TODAY AT [facility] ON THE 1ST FLOOR  Follow-Up: KEEP SCHEDULED APPOINTMENT WITH DR Stanford Breed  Special Instructions: CALL DR JAFFE TO RE-START ARICEPT 5MG  DAILY  CALL us IF YOU GET LIGHTHEADED OR IF YOU GO BACK INTO AFIB  CALL us IF YOUR HEART RATE GOES ABOVE 170 BEATS    Thank you for choosing [facility] at [employer], LPN RHONDA BARRETT, PA-C

## 2016-07-06 NOTE — Telephone Encounter (Signed)
Pt seen this morning at 11:30a for rescheduled appt due to office opening delay.

## 2016-07-06 NOTE — Telephone Encounter (Signed)
Agree with plan Brian Crenshaw  

## 2016-07-16 ENCOUNTER — Emergency Department (HOSPITAL_BASED_OUTPATIENT_CLINIC_OR_DEPARTMENT_OTHER)
Admission: EM | Admit: 2016-07-16 | Discharge: 2016-07-16 | Disposition: A | Discharge status: Home / Self Care | Payer: PPO | Attending: Emergency Medicine | Admitting: Emergency Medicine

## 2016-07-16 ENCOUNTER — Telehealth: Payer: Self-pay | Admitting: Interventional Cardiology

## 2016-07-16 ENCOUNTER — Encounter (HOSPITAL_BASED_OUTPATIENT_CLINIC_OR_DEPARTMENT_OTHER): Payer: Self-pay

## 2016-07-16 DIAGNOSIS — Z87891 Personal history of nicotine dependence: Secondary | ICD-10-CM | POA: Insufficient documentation

## 2016-07-16 DIAGNOSIS — I4891 Unspecified atrial fibrillation: Secondary | ICD-10-CM

## 2016-07-16 DIAGNOSIS — I480 Paroxysmal atrial fibrillation: Secondary | ICD-10-CM | POA: Insufficient documentation

## 2016-07-16 DIAGNOSIS — R0789 Other chest pain: Secondary | ICD-10-CM | POA: Diagnosis not present

## 2016-07-16 DIAGNOSIS — I2510 Atherosclerotic heart disease of native coronary artery without angina pectoris: Secondary | ICD-10-CM | POA: Insufficient documentation

## 2016-07-16 DIAGNOSIS — N3001 Acute cystitis with hematuria: Secondary | ICD-10-CM

## 2016-07-16 DIAGNOSIS — I10 Essential (primary) hypertension: Secondary | ICD-10-CM | POA: Insufficient documentation

## 2016-07-16 DIAGNOSIS — R000 Tachycardia, unspecified: Secondary | ICD-10-CM | POA: Diagnosis not present

## 2016-07-16 DIAGNOSIS — R079 Chest pain, unspecified: Secondary | ICD-10-CM

## 2016-07-16 LAB — CBC WITH DIFFERENTIAL/PLATELET
BASOS ABS: 0 10*3/uL (ref 0.0–0.1)
BASOS PCT: 0 %
EOS PCT: 1 %
Eosinophils Absolute: 0.2 10*3/uL (ref 0.0–0.7)
HEMATOCRIT: 35.4 % — AB (ref 39.0–52.0)
Hemoglobin: 12 g/dL — ABNORMAL LOW (ref 13.0–17.0)
LYMPHS PCT: 8 %
Lymphs Abs: 1.3 10*3/uL (ref 0.7–4.0)
MCH: 28 pg (ref 26.0–34.0)
MCHC: 33.9 g/dL (ref 30.0–36.0)
MCV: 82.5 fL (ref 78.0–100.0)
MONO ABS: 1.3 10*3/uL — AB (ref 0.1–1.0)
Monocytes Relative: 8 %
NEUTROS ABS: 14.7 10*3/uL — AB (ref 1.7–7.7)
Neutrophils Relative %: 83 %
PLATELETS: 703 10*3/uL — AB (ref 150–400)
RBC: 4.29 MIL/uL (ref 4.22–5.81)
RDW: 14.9 % (ref 11.5–15.5)
WBC: 17.6 10*3/uL — AB (ref 4.0–10.5)

## 2016-07-16 LAB — URINALYSIS, ROUTINE W REFLEX MICROSCOPIC
BILIRUBIN URINE: NEGATIVE
Glucose, UA: NEGATIVE mg/dL
KETONES UR: NEGATIVE mg/dL
Nitrite: NEGATIVE
PROTEIN: 100 mg/dL — AB
Specific Gravity, Urine: 1.014 (ref 1.005–1.030)
pH: 6 (ref 5.0–8.0)

## 2016-07-16 LAB — BASIC METABOLIC PANEL
ANION GAP: 9 (ref 5–15)
BUN: 25 mg/dL — ABNORMAL HIGH (ref 6–20)
CALCIUM: 8.8 mg/dL — AB (ref 8.9–10.3)
CO2: 23 mmol/L (ref 22–32)
Chloride: 102 mmol/L (ref 101–111)
Creatinine, Ser: 1.02 mg/dL (ref 0.61–1.24)
Glucose, Bld: 162 mg/dL — ABNORMAL HIGH (ref 65–99)
POTASSIUM: 4.1 mmol/L (ref 3.5–5.1)
Sodium: 134 mmol/L — ABNORMAL LOW (ref 135–145)

## 2016-07-16 LAB — TROPONIN I
TROPONIN I: 0.05 ng/mL — AB (ref <0.03)
Troponin I: 0.03 ng/mL (ref <0.03)

## 2016-07-16 LAB — URINALYSIS, MICROSCOPIC (REFLEX)

## 2016-07-16 MED ORDER — DILTIAZEM HCL 25 MG/5ML IV SOLN
20.0000 mg | Freq: Once | INTRAVENOUS | Status: AC
  Administered 2016-07-16: 20 mg via INTRAVENOUS
  Filled 2016-07-16: qty 5

## 2016-07-16 MED ORDER — DILTIAZEM HCL-DEXTROSE 100-5 MG/100ML-% IV SOLN (PREMIX)
5.0000 mg/h | INTRAVENOUS | Status: DC
  Administered 2016-07-16: 5 mg/h via INTRAVENOUS
  Filled 2016-07-16: qty 100

## 2016-07-16 MED ORDER — DILTIAZEM HCL 100 MG IV SOLR
INTRAVENOUS | Status: AC
  Filled 2016-07-16: qty 100

## 2016-07-16 MED ORDER — CEFTRIAXONE SODIUM 1 G IJ SOLR
1.0000 g | Freq: Once | INTRAMUSCULAR | Status: AC
  Administered 2016-07-16: 1 g via INTRAVENOUS
  Filled 2016-07-16: qty 10

## 2016-07-16 MED ORDER — CEPHALEXIN 500 MG PO CAPS: 500.0000 mg | ORAL_CAPSULE | Freq: Two times a day (BID) | ORAL | 0 refills | Status: DC

## 2016-07-16 NOTE — ED Provider Notes (Addendum)
[facility] DEPT [facility] Provider Note: Georgena Spurling, MD, FACEP  CSN: [id number] MRN: [id number] ARRIVAL: 07/16/16 at [place]: [place]  Chest Pain   HISTORY OF PRESENT ILLNESS  Justin [name] is a 83 y.o. male with a history of paroxysmal atrial fibrillation and coronary artery disease. He was awakened from sleep about 2 hours ago with severe chest pressure that was associated with shortness of breath. He also noted that his heart rate was rapid and irregular consistent with atrial fibrillation. He does not usually have this discomfort with the atrial fibrillation. The pain and shortness of breath resolved on arrival in the ED and he is without discomfort at the present time. He denies nausea or diaphoresis. There are no specific exacerbating or mitigating factors. His heart rate was noted to be in the 120s to 140s on arrival. He has also been having dysuria and hematuria and would like to be checked for urinary tract infection.   Past Medical History:  Diagnosis Date  . Basal cell carcinoma   . BPH (benign prostatic hypertrophy)   . CAD (coronary artery disease) 1991   a. H/o MI w/ PTCA;  b. s/p CABG by Dr Roxan Hockey w/ LIMA-LAD, SVG-OM1-OM3, SVG-D1, SVG-PDA  . Disequilibrium 04/03/2012  . HTN (hypertension)   . Hyperlipidemia   . Internal hemorrhoids   . Medicare annual wellness visit, subsequent 03/16/2015   Follows with cardiology, Dr Stanford Breed, Follows with dermatology, Dr Altamese Cabal. Aged out of colonoscopy  . Moderate aortic stenosis    a. 08/2014 Echo: EF 55-60%, no rwma, Gr 1 DD, mod AS, mildly dil asc Ao, mild MR, sev dil LA.  [name] PAF (paroxysmal atrial fibrillation) ([place])    a. Dx 08/2014;  b. CHA2DS2VASc = 4 - decision made to withold [place] 2/2 unsteady gait and syncope.  . Sigmoid diverticulitis   . Syncope    a. 08/2014 - ? orthostatic    Past Surgical History:  Procedure Laterality Date  . COLONOSCOPY    . CORONARY ARTERY BYPASS GRAFT  2000   Dr  Roxan Hockey  LIMA-LAD, SVG-OM1-OM3, SVG-D1, SVG-PDA.   [name] HIP ARTHROPLASTY Left 10/14/2015   Procedure: LEFT HIP HEMI ARTHROPLASTY;  Surgeon: [name], MD;  Location: [facility];  Service: Orthopedics;  Laterality: Left;  . SHOULDER SURGERY     x2    Family History  Problem Relation Age of Onset  . Coronary artery disease Father   . Hyperlipidemia Father   . Heart attack Father   . Alcohol abuse Father   . Bipolar disorder Mother   . Heart disease Mother   . Alcohol abuse Brother   . Cancer Brother   . Cancer Maternal Grandfather     oral cancer, chew tobacco  . Cancer Sister   . Glaucoma Sister   . Alcohol abuse      Social History  Substance Use Topics  . Smoking status: Former Smoker    Packs/day: 0.50    Years: 15.00    Quit date: 06/29/1971  . Smokeless tobacco: Never Used  . Alcohol use 0.6 - 1.2 oz/week    1 - 2 Glasses of wine per week     Comment: alternates wine and liquor. 1/2 pint liquor/week    Prior to Admission medications   Medication Sig Start Date End Date Taking? Authorizing Provider  acetaminophen (TYLENOL) 325 MG tablet Take 1-2 tablets (325-650 mg total) by mouth every 6 (six) hours as needed for mild pain or moderate pain.  10/15/15   Loni Dolly, PA-C  apixaban (ELIQUIS) 5 MG TABS tablet Take 1 tablet (5 mg total) by mouth 2 (two) times daily. 10/15/15   Loni Dolly, PA-C  CARTIA XT 120 MG 24 hr capsule TAKE ONE CAPSULE BY MOUTH ONCE DAILY 08/13/15   Larey Dresser, MD  Cholecalciferol (VITAMIN D3) 5000 units CAPS Take 1 capsule by mouth daily.    Historical Provider, MD  hydrochlorothiazide (MICROZIDE) 12.5 MG capsule Take 1 capsule (12.5 mg total) by mouth daily. 09/20/14   Carlena Bjornstad, MD  lovastatin (MEVACOR) 20 MG tablet TAKE ONE TABLET BY MOUTH ONCE DAILY AT BEDTIME 01/20/16   Larey Dresser, MD  Multiple Vitamin (MULTIVITAMIN) capsule Take 0.5 capsules by mouth daily.     Historical Provider, MD  multivitamin-lutein [facility]) CAPS Take 1  capsule by mouth daily.     Historical Provider, MD  Probiotic Product (PROBIOTIC DAILY PO) Take 1 capsule by mouth daily.    Historical Provider, MD    Allergies Dutasteride; Finasteride; and Toradol [ketorolac tromethamine]   REVIEW OF SYSTEMS  Negative except as noted here or in the History of Present Illness.   PHYSICAL EXAMINATION  Initial Vital Signs Blood pressure 134/83, pulse (!) 140, temperature 97.8 F (36.6 C), temperature source Oral, resp. rate 15, height 5\' 5"  (1.651 m), weight 152 lb (68.9 kg), SpO2 98 %.  Examination General: Well-developed, well-nourished male in no acute distress; appearance consistent with age of record HENT: normocephalic; atraumatic Eyes: pupils equal, round and reactive to light; extraocular muscles intact; Lens implants Neck: supple Heart: Irregular rhythm; tachycardia; harsh systolic murmur left upper sternal border Lungs: clear to auscultation bilaterally Abdomen: soft; nondistended; nontender; no masses or hepatosplenomegaly; bowel sounds present Extremities: Arthritic changes; pulses normal; no edema Neurologic: Awake, alert and oriented; motor function intact in all extremities and symmetric; no facial droop Skin: Warm and dry Psychiatric: Normal mood and affect   RESULTS  Summary of this visit's results, reviewed by myself:   EKG Interpretation  Date/Time:  Friday July 16 2016 01:19:33 EST Ventricular Rate:  136 PR Interval:    QRS Duration: 102 QT Interval:  304 QTC Calculation: 458 R Axis:   -67 Text Interpretation:  Atrial fibrillation with rapid V-rate Left anterior fascicular block Anteroseptal infarct, old ST depression, probably rate related Previously NSR Confirmed by Florina Ou  MD, Jenny Reichmann ([id number]) on 07/16/2016 1:21:54 AM       EKG Interpretation  Date/Time:  Friday July 16 2016 04:08:29 EST Ventricular Rate:  68 PR Interval:    QRS Duration: 91 QT Interval:  412 QTC Calculation: 439 R Axis:   -45 Text  Interpretation:  Sinus rhythm Probable left atrial enlargement Left anterior fascicular block Anteroseptal infarct, old Previously AFIB with RVR Confirmed by Florina Ou  MD, Jenny Reichmann ([id number]) on 07/16/2016 4:19:14 AM        Laboratory Studies: Results for orders placed or performed during the hospital encounter of 07/16/16 (from the past 24 hour(s))  CBC with Differential/Platelet     Status: Abnormal   Collection Time: 07/16/16  1:30 AM  Result Value Ref Range   WBC 17.6 (H) 4.0 - 10.5 K/uL   RBC 4.29 4.22 - 5.81 MIL/uL   Hemoglobin 12.0 (L) 13.0 - 17.0 g/dL   HCT 35.4 (L) 39.0 - 52.0 %   MCV 82.5 78.0 - 100.0 fL   MCH 28.0 26.0 - 34.0 pg   MCHC 33.9 30.0 - 36.0 g/dL   RDW 14.9 11.5 - 15.5 %  Platelets 703 (H) 150 - 400 K/uL   Neutrophils Relative % 83 %   Neutro Abs 14.7 (H) 1.7 - 7.7 K/uL   Lymphocytes Relative 8 %   Lymphs Abs 1.3 0.7 - 4.0 K/uL   Monocytes Relative 8 %   Monocytes Absolute 1.3 (H) 0.1 - 1.0 K/uL   Eosinophils Relative 1 %   Eosinophils Absolute 0.2 0.0 - 0.7 K/uL   Basophils Relative 0 %   Basophils Absolute 0.0 0.0 - 0.1 K/uL  Basic metabolic panel     Status: Abnormal   Collection Time: 07/16/16  1:30 AM  Result Value Ref Range   Sodium 134 (L) 135 - 145 mmol/L   Potassium 4.1 3.5 - 5.1 mmol/L   Chloride 102 101 - 111 mmol/L   CO2 23 22 - 32 mmol/L   Glucose, Bld 162 (H) 65 - 99 mg/dL   BUN 25 (H) 6 - 20 mg/dL   Creatinine, Ser 1.02 0.61 - 1.24 mg/dL   Calcium 8.8 (L) 8.9 - 10.3 mg/dL   GFR calc non Af Amer >60 >60 mL/min   GFR calc Af Amer >60 >60 mL/min   Anion gap 9 5 - 15  Troponin I     Status: Abnormal   Collection Time: 07/16/16  1:30 AM  Result Value Ref Range   Troponin I 0.03 (HH) <0.03 ng/mL  Urinalysis, Routine w reflex microscopic     Status: Abnormal   Collection Time: 07/16/16  3:25 AM  Result Value Ref Range   Color, Urine YELLOW YELLOW   APPearance TURBID (A) CLEAR   Specific Gravity, Urine 1.014 1.005 - 1.030   pH 6.0 5.0 - 8.0    Glucose, UA NEGATIVE NEGATIVE mg/dL   Hgb urine dipstick LARGE (A) NEGATIVE   Bilirubin Urine NEGATIVE NEGATIVE   Ketones, ur NEGATIVE NEGATIVE mg/dL   Protein, ur 100 (A) NEGATIVE mg/dL   Nitrite NEGATIVE NEGATIVE   Leukocytes, UA LARGE (A) NEGATIVE  Urinalysis, Microscopic (reflex)     Status: Abnormal   Collection Time: 07/16/16  3:25 AM  Result Value Ref Range   RBC / HPF TOO NUMEROUS TO COUNT 0 - 5 RBC/hpf   WBC, UA TOO NUMEROUS TO COUNT 0 - 5 WBC/hpf   Bacteria, UA MANY (A) NONE SEEN   Squamous Epithelial / LPF 0-5 (A) NONE SEEN  Troponin I     Status: Abnormal   Collection Time: 07/16/16  4:25 AM  Result Value Ref Range   Troponin I 0.05 (HH) <0.03 ng/mL   Imaging Studies: No results found.  ED COURSE  Nursing notes and initial vitals signs, including pulse oximetry, reviewed.  Vitals:   07/16/16 0300 07/16/16 0330 07/16/16 0400 07/16/16 0430  BP: 114/70 122/92 108/65 105/68  Pulse: (!) 37 83 64 68  Resp: 15 17 13 14   Temp:      TempSrc:      SpO2: 96% 96% 97% 96%  Weight:      Height:       2:58 AM Rate controlled on Cardizem drip. Patient continues to be asymptomatic.  3:19 AM Discussed with Dr. Irish Lack of cardiology. He advises repair. The troponin at 3 hours and reassess.  4:07 AM Patient converted to normal sinus rhythm. Cardizem drip discontinued.  5:10 AM Patient continues to be asymptomatic. His repeat troponin is 0.05; I suspect this is due to demand ischemia associated with his earlier rapid rate. This was discussed with Dr. Irish Lack who does not believe that admission is  indicated at this time. He will arrange for prompt office follow-up. We will treat his urinary tract infection as an outpatient following a gram of Rocephin IV in the ED.  PROCEDURES    ED DIAGNOSES     ICD-9-CM ICD-10-CM   1. Atrial fibrillation with rapid ventricular response (HCC) 427.31 I48.91   2. Chest pain in adult 786.50 R07.9   3. Acute cystitis with hematuria 595.0  N30.01        Shanon Rosser, MD 07/16/16 [id number]    Shanon Rosser, MD 07/16/16 [phone]

## 2016-07-16 NOTE — ED Notes (Signed)
Pt denies any chest discomfort at this time.

## 2016-07-16 NOTE — ED Notes (Signed)
Pt verbalizes understanding of d/c instructions and denies any further needs at this time. 

## 2016-07-16 NOTE — Telephone Encounter (Signed)
Spoke to patient, relayed provider recommendations, he confirmed able to do APP visit on 1/25 w Rhonda. I have scheduled this w instruction to call if new concerns.

## 2016-07-16 NOTE — ED Notes (Signed)
Pt still denies pain

## 2016-07-16 NOTE — ED Triage Notes (Addendum)
Pt c/o central chest heaviness and tightness for the last two hours with SOB, pt denies nausea, denies referred pain to back, jaw or shoulder, denies diaphoresis.

## 2016-07-16 NOTE — Telephone Encounter (Signed)
Left message to call back and ask for triage  

## 2016-07-16 NOTE — Telephone Encounter (Signed)
Make appt with pa

## 2016-07-16 NOTE — Telephone Encounter (Signed)
Patient was seen in the ER for AFib with RVR.  He had HR up to the 140s.  Eventually, he converted to NSR.  He was found to have a UTI.  Discussed with ER MD.  Minimal troponin elevation likely from elevated HR.  Patient was asymptomatic and it was felt that he could go home.  Please arrange for f/u with the patient in our office with Dr. Stanford Breed or APP.    It was noted that due to bradycardia, rate slowing drugs could not be increased.  May need EP eval if appropriate.  Jettie Booze, MD

## 2016-07-17 ENCOUNTER — Encounter (HOSPITAL_BASED_OUTPATIENT_CLINIC_OR_DEPARTMENT_OTHER): Payer: Self-pay | Admitting: Emergency Medicine

## 2016-07-17 ENCOUNTER — Emergency Department (HOSPITAL_BASED_OUTPATIENT_CLINIC_OR_DEPARTMENT_OTHER)
Admission: EM | Admit: 2016-07-17 | Discharge: 2016-07-17 | Disposition: A | Discharge status: Home / Self Care | Payer: PPO | Attending: Emergency Medicine | Admitting: Emergency Medicine

## 2016-07-17 DIAGNOSIS — Z87891 Personal history of nicotine dependence: Secondary | ICD-10-CM | POA: Diagnosis not present

## 2016-07-17 DIAGNOSIS — N3090 Cystitis, unspecified without hematuria: Secondary | ICD-10-CM | POA: Diagnosis not present

## 2016-07-17 DIAGNOSIS — I10 Essential (primary) hypertension: Secondary | ICD-10-CM | POA: Diagnosis not present

## 2016-07-17 DIAGNOSIS — R339 Retention of urine, unspecified: Secondary | ICD-10-CM

## 2016-07-17 LAB — BASIC METABOLIC PANEL
Anion gap: 7 (ref 5–15)
BUN: 17 mg/dL (ref 6–20)
CO2: 25 mmol/L (ref 22–32)
Calcium: 8.8 mg/dL — ABNORMAL LOW (ref 8.9–10.3)
Chloride: 99 mmol/L — ABNORMAL LOW (ref 101–111)
Creatinine, Ser: 0.82 mg/dL (ref 0.61–1.24)
GFR calc Af Amer: >60 mL/min (ref >60)
GFR calc non Af Amer: >60 mL/min (ref >60)
GLUCOSE: 111 mg/dL — AB (ref 65–99)
POTASSIUM: 4.1 mmol/L (ref 3.5–5.1)
Sodium: 131 mmol/L — ABNORMAL LOW (ref 135–145)

## 2016-07-17 LAB — URINALYSIS, ROUTINE W REFLEX MICROSCOPIC
Bilirubin Urine: NEGATIVE
Glucose, UA: NEGATIVE mg/dL
Ketones, ur: NEGATIVE mg/dL
NITRITE: NEGATIVE
PH: 6.5 (ref 5.0–8.0)
Protein, ur: 30 mg/dL — AB
Specific Gravity, Urine: 1.009 (ref 1.005–1.030)

## 2016-07-17 LAB — URINALYSIS, MICROSCOPIC (REFLEX): SQUAMOUS EPITHELIAL / LPF: NONE SEEN

## 2016-07-17 NOTE — ED Provider Notes (Signed)
[facility] DEPT Provider Note   CSN: [id number] Arrival date & time: 07/17/16  0800     History   Chief Complaint Chief Complaint  Patient presents with  . Urinary Retention    HPI Justin [name] is a 83 y.o. male.  HPI  Pt presenting with c/o urinary retention.  Pt states he has been unable to urinate since 8pm last night.  He has hx of prostate enlargement.  Was diagnosed yesterday with UTI- given dose of rocephin and he has gotten the antibiotic for cystitis filled.  No vomiting.  No fever/chills.  There are no other associated systemic symptoms, there are no other alleviating or modifying factors.   Past Medical History:  Diagnosis Date  . Basal cell carcinoma   . BPH (benign prostatic hypertrophy)   . CAD (coronary artery disease) 1991   a. H/o MI w/ PTCA;  b. s/p CABG by Dr Roxan Hockey w/ LIMA-LAD, SVG-OM1-OM3, SVG-D1, SVG-PDA  . Disequilibrium 04/03/2012  . HTN (hypertension)   . Hyperlipidemia   . Internal hemorrhoids   . Medicare annual wellness visit, subsequent 03/16/2015   Follows with cardiology, Dr Stanford Breed, Follows with dermatology, Dr Altamese Cabal. Aged out of colonoscopy  . Moderate aortic stenosis    a. 08/2014 Echo: EF 55-60%, no rwma, Gr 1 DD, mod AS, mildly dil asc Ao, mild MR, sev dil LA.  [name] PAF (paroxysmal atrial fibrillation) ([place])    a. Dx 08/2014;  b. CHA2DS2VASc = 4 - decision made to withold [place] 2/2 unsteady gait and syncope.  . Sigmoid diverticulitis   . Syncope    a. 08/2014 - ? orthostatic    Patient Active Problem List   Diagnosis Date Noted  . Fall   . Closed left hip fracture ([place]) 10/13/2015  . Medicare annual wellness visit, subsequent 03/16/2015  . Paroxysmal atrial fibrillation ([place]) 09/26/2014  . Orthostatic hypotension 09/15/2014  . Syncope   . Head trauma 09/14/2014  . Alteration in anticoagulation 09/14/2014  . Tinnitus 03/21/2014  . Epigastric pain 02/01/2013  . Gait instability 01/01/2013  . Disequilibrium 04/03/2012  .  Hyperglycemia 09/13/2011  . Aortic stenosis 03/10/2011  . CAD, ARTERY BYPASS GRAFT 02/23/2010  . Hyperlipidemia 07/04/2008  . Essential hypertension 07/04/2008  . Coronary atherosclerosis 07/04/2008  . RHINITIS 07/04/2008  . BENIGN PROSTATIC HYPERTROPHY, WITH OBSTRUCTION 07/04/2008    Past Surgical History:  Procedure Laterality Date  . COLONOSCOPY    . CORONARY ARTERY BYPASS GRAFT  2000   Dr Roxan Hockey  LIMA-LAD, SVG-OM1-OM3, SVG-D1, SVG-PDA.   [name] HIP ARTHROPLASTY Left 10/14/2015   Procedure: LEFT HIP HEMI ARTHROPLASTY;  Surgeon: [name], MD;  Location: [facility];  Service: Orthopedics;  Laterality: Left;  . SHOULDER SURGERY     x2       Home Medications    Prior to Admission medications   Medication Sig Start Date End Date Taking? Authorizing Provider  acetaminophen (TYLENOL) 325 MG tablet Take 1-2 tablets (325-650 mg total) by mouth every 6 (six) hours as needed for mild pain or moderate pain. 10/15/15   Loni Dolly, PA-C  apixaban (ELIQUIS) 5 MG TABS tablet Take 1 tablet (5 mg total) by mouth 2 (two) times daily. 10/15/15   Loni Dolly, PA-C  CARTIA XT 120 MG 24 hr capsule TAKE ONE CAPSULE BY MOUTH ONCE DAILY 08/13/15   Larey Dresser, MD  cephALEXin (KEFLEX) 500 MG capsule Take 1 capsule (500 mg total) by mouth 2 (two) times daily. 07/16/16   Shanon Rosser, MD  Cholecalciferol (  VITAMIN D3) 5000 units CAPS Take 1 capsule by mouth daily.    Historical Provider, MD  hydrochlorothiazide (MICROZIDE) 12.5 MG capsule Take 1 capsule (12.5 mg total) by mouth daily. 09/20/14   Carlena Bjornstad, MD  lovastatin (MEVACOR) 20 MG tablet TAKE ONE TABLET BY MOUTH ONCE DAILY AT BEDTIME 01/20/16   Larey Dresser, MD  Multiple Vitamin (MULTIVITAMIN) capsule Take 0.5 capsules by mouth daily.     Historical Provider, MD  multivitamin-lutein [facility]) CAPS Take 1 capsule by mouth daily.     Historical Provider, MD  Probiotic Product (PROBIOTIC DAILY PO) Take 1 capsule by mouth daily.     Historical Provider, MD    Family History Family History  Problem Relation Age of Onset  . Coronary artery disease Father   . Hyperlipidemia Father   . Heart attack Father   . Alcohol abuse Father   . Bipolar disorder Mother   . Heart disease Mother   . Alcohol abuse Brother   . Cancer Brother   . Cancer Maternal Grandfather     oral cancer, chew tobacco  . Cancer Sister   . Glaucoma Sister   . Alcohol abuse      Social History Social History  Substance Use Topics  . Smoking status: Former Smoker    Packs/day: 0.50    Years: 15.00    Quit date: 06/29/1971  . Smokeless tobacco: Never Used  . Alcohol use 0.6 - 1.2 oz/week    1 - 2 Glasses of wine per week     Comment: alternates wine and liquor. 1/2 pint liquor/week     Allergies   Dutasteride; Finasteride; and Toradol [ketorolac tromethamine]   Review of Systems Review of Systems  ROS reviewed and all otherwise negative except for mentioned in HPI   Physical Exam Updated Vital Signs BP 155/73 (BP Location: Right Arm)   Pulse 72   Temp 97.7 F (36.5 C) (Oral)   Resp 18   Ht 5\' 5"  (1.651 m)   Wt 68.9 kg   SpO2 98%   BMI 25.29 kg/m  Vitals reviewed Physical Exam Physical Examination: General appearance - alert, well appearing, and in no distress Mental status - alert, oriented to person, place, and time Eyes - no conjunctival injection, no scleral icterus Mouth - mucous membranes moist, pharynx normal without lesions Chest - clear to auscultation, no wheezes, rales or rhonchi, symmetric air entry Heart - normal rate, regular rhythm, normal S1, S2, no murmurs, rubs, clicks or gallops Abdomen - soft, nontender, nondistended, no masses or organomegaly GU Male - no penile lesions or discharge, no testicular masses or tenderness, no hernias, foley in place Neurological - alert, oriented, normal speech,  Extremities - peripheral pulses normal, no pedal edema, no clubbing or cyanosis Skin - normal coloration and  turgor, no rashes  ED Treatments / Results  Labs (all labs ordered are listed, but only abnormal results are displayed) Labs Reviewed  URINALYSIS, ROUTINE W REFLEX MICROSCOPIC - Abnormal; Notable for the following:       Result Value   APPearance CLOUDY (*)    Hgb urine dipstick MODERATE (*)    Protein, ur 30 (*)    Leukocytes, UA LARGE (*)    All other components within normal limits  BASIC METABOLIC PANEL - Abnormal; Notable for the following:    Sodium 131 (*)    Chloride 99 (*)    Glucose, Bld 111 (*)    Calcium 8.8 (*)    All other  components within normal limits  URINALYSIS, MICROSCOPIC (REFLEX) - Abnormal; Notable for the following:    Bacteria, UA MANY (*)    All other components within normal limits  URINE CULTURE    EKG  EKG Interpretation None       Radiology No results found.  Procedures Procedures (including critical care time)  Medications Ordered in ED Medications - No data to display   Initial Impression / Assessment and Plan / ED Course  I have reviewed the triage vital signs and the nursing notes.  Pertinent labs & imaging results that were available during my care of the patient were reviewed by me and considered in my medical decision making (see chart for details).     Pt presenting with c/o urinary retention. He has not been able to pass urine for over 12 hours.  He was seen in the ED yesterday and diagnosed with UTI- he has started the abx for this.  No fever/chills, no vomiting.  After foley catheter placed he had relief of his lower abdominal discomfort.  Urine and culture sent.  Creatinine is not elevated.  Pt advised to f/u with urology to have catheter removed and voiding trial.  Discharged with strict return precautions.  Pt agreeable with plan. Final Clinical Impressions(s) / ED Diagnoses   Final diagnoses:  Urinary retention  Cystitis    New Prescriptions Discharge Medication List as of 07/17/2016 12:26 PM       Alfonzo Beers, MD 07/18/16 [phone]

## 2016-07-17 NOTE — ED Notes (Signed)
ED Provider at bedside. 

## 2016-07-17 NOTE — Discharge Instructions (Signed)
Return to the ED with any concerns including foley catheter not draining, abdominal pain, fever/chills, vomiting and not able to keep down liquids or antibiotics, decreased level of alertness/lethargy, or any other alarming symptoms

## 2016-07-17 NOTE — ED Triage Notes (Signed)
Pt seen and treated for persistant UTI since yesterday.  Last seen in ED yesterday.  Pt c/o unable to urinate now today and states extreme pain and pressure in bladder.  Pt h/o enlarged prostate.

## 2016-07-17 NOTE — ED Notes (Signed)
Leg urine collection bag applied. Pt sent home with large urine foley bag to use at night time. Pt and wife given instruction how to change out bags and empty. Also, explained that bag should always be below bladder as far as gravity, to prevent urine backflow and extended UTI. Instructed to leave foley intact, as well as statlock.

## 2016-07-18 LAB — URINE CULTURE
Culture: >=100000 — AB
Culture: NO GROWTH

## 2016-07-19 ENCOUNTER — Telehealth (HOSPITAL_BASED_OUTPATIENT_CLINIC_OR_DEPARTMENT_OTHER): Payer: Self-pay | Admitting: Emergency Medicine

## 2016-07-19 NOTE — Progress Notes (Signed)
ED Antimicrobial Stewardship Positive Culture Follow Up   Justin [name] is an 83 y.o. male who presented to [facility] on 07/16/2016 with a chief complaint of  Chief Complaint  Patient presents with  . Chest Pain    Recent Results (from the past 720 hour(s))  Urine culture     Status: Abnormal   Collection Time: 07/16/16  3:25 AM  Result Value Ref Range Status   Specimen Description URINE, RANDOM  Final   Special Requests NONE  Final   Culture >=100,000 COLONIES/mL ENTEROCOCCUS FAECALIS (A)  Final   Report Status 07/18/2016 FINAL  Final   Organism ID, Bacteria ENTEROCOCCUS FAECALIS (A)  Final      Susceptibility   Enterococcus faecalis - MIC*    AMPICILLIN <=2 SENSITIVE Sensitive     LEVOFLOXACIN >=8 RESISTANT Resistant     NITROFURANTOIN <=16 SENSITIVE Sensitive     VANCOMYCIN <=0.5 SENSITIVE Sensitive     * >=100,000 COLONIES/mL ENTEROCOCCUS FAECALIS  Urine culture     Status: None   Collection Time: 07/17/16  9:19 AM  Result Value Ref Range Status   Specimen Description URINE, RANDOM  Final   Special Requests NONE  Final   Culture   Final    NO GROWTH Performed at [facility], [address] [place], [place] [address]    Report Status 07/18/2016 FINAL  Final    [x]  Treated with cephalexin, organism resistant to prescribed antimicrobial  New antibiotic prescription: Stop cephalexin. Start amoxicillin 500 mg BID x 7 days  ED Provider: Pearlie Oyster, PA-C  Dimitri Ped, PharmD, [place] PGY-2 Infectious Diseases Pharmacy Resident Pager: [phone] 07/19/2016, 9:02 AM

## 2016-07-19 NOTE — Telephone Encounter (Signed)
Post ED Visit - Positive Culture Follow-up: Successful Patient Follow-Up  Culture assessed and recommendations reviewed by: []  Elenor Quinones, Pharm.D. []  Heide Guile, Pharm.D., BCPS []  Parks Neptune, Pharm.D. []  Alycia Rossetti, Pharm.D., BCPS []  [place], Pharm.D., BCPS, AAHIVP []  Legrand Como, Pharm.D., BCPS, AAHIVP []  Milus Glazier, Pharm.D. []  Rob Evette Doffing, Pharm.D. Dimitri Ped PharmD  Positive urine culture  []  Patient discharged without antimicrobial prescription and treatment is now indicated [x]  Organism is resistant to prescribed ED discharge antimicrobial []  Patient with positive blood cultures  Changes discussed with ED provider: Pearlie Oyster PA New antibiotic prescription stop cephalexin, start Amoxicillin 500mg  po bid x 7 days Called to [facility] @ [phone]  Contacted patient, 07/19/2016 1230   Justin [name] 07/19/2016, 12:29 PM

## 2016-07-22 ENCOUNTER — Encounter: Payer: Self-pay | Admitting: Physician Assistant

## 2016-07-22 ENCOUNTER — Ambulatory Visit (INDEPENDENT_AMBULATORY_CARE_PROVIDER_SITE_OTHER): Payer: PPO | Admitting: Physician Assistant

## 2016-07-22 VITALS — BP 122/60 | HR 68 | Ht 65.0 in | Wt 144.3 lb

## 2016-07-22 DIAGNOSIS — I480 Paroxysmal atrial fibrillation: Secondary | ICD-10-CM

## 2016-07-22 DIAGNOSIS — Z7901 Long term (current) use of anticoagulants: Secondary | ICD-10-CM | POA: Diagnosis not present

## 2016-07-22 DIAGNOSIS — I25810 Atherosclerosis of coronary artery bypass graft(s) without angina pectoris: Secondary | ICD-10-CM | POA: Diagnosis not present

## 2016-07-22 DIAGNOSIS — I10 Essential (primary) hypertension: Secondary | ICD-10-CM | POA: Diagnosis not present

## 2016-07-22 MED ORDER — HYDROCHLOROTHIAZIDE 12.5 MG PO CAPS: 12.5000 mg | ORAL_CAPSULE | ORAL | 3 refills | Status: DC

## 2016-07-22 NOTE — Patient Instructions (Addendum)
Medication Instructions:  BEGIN taking HCTZ 12.5mg  (1 capsule) on Mondays, Wednesdays, Fridays, and Saturdays.  Labwork: NONE  Testing/Procedures: NONE  Follow-Up: Keep Follow up appointment with Dr. Stanford Breed on 2/14 at 9:15 AM in [place].  Any Other Special Instructions Will Be Listed Below (If Applicable).     If you need a refill on your cardiac medications before your next appointment, please call your pharmacy.

## 2016-07-22 NOTE — Progress Notes (Signed)
Cardiology Office Note   Date:  07/22/2016   ID:  [name], DOB Sep 22, 1928, MRN [id number]  PCP:  Justin Homans, MD  Cardiologist:  Dr. Stanford Breed, 05/07/2016  Justin Ferries, PA-C 07/06/2016  Chief Complaint  Patient presents with  . Follow-up    Stanford Breed  pt wife states he has been better the last couple of days; occasional dizziness when he sits up too quickly, wife states he has been unsteady for weeks; no other Sx.    History of Present Illness: Justin [name] is a 83 y.o. male with a history of PAF on Eliquis, CABG x 5 2000 w/ LIMA-LAD, SVG-OM1-OM3, SVG-D1, SVG-PDA. Hx HTN, HLD, BPH, mod AS, EF 50-55% echo 12/2015  01/19 ER visit for chest pain and shortness of breath associated with the rapid heart rate. Symptoms resolved on arrival to the ED, patient also notes dysuria and hematuria. Initial ECG was atrial fibrillation, heart rate 136. Patient was in sinus rhythm on her recheck with a normal heart rate 01/20, ER visit for UTI. Urine culture came back resistant to Keflex which had been prescribed, antibiotic was changed to amoxicillin. They cathed him and got 2200 cc urine within a few hours.   Justin [name] presents for cardiology follow up.  His is in SR today. He is feeling much better. He keeps walking off without his cane. He is not having palpitations or SOB. He still has the catheter in and it is draining well. He has no extra fluid on board. He was eating poorly for a couple of weeks, but his appetite has begun to improve and he is eating better now.  He has not had any more palpitations. He has not had any chest pain. He was able to go to the gym at [facility] and get on the exercise bike for 30 minutes without any difficulty. He enjoyed doing this and hopes to keep doing it.  He has not had any lower extremity edema. He denies orthopnea or PND. He wonders if he can decrease the frequency of the HCTZ. It can get inconvenient to take it at times. Both his  dyspnea on exertion in his stamina have improved.   Past Medical History:  Diagnosis Date  . Basal cell carcinoma   . BPH (benign prostatic hypertrophy)   . CAD (coronary artery disease) 1991   a. H/o MI w/ PTCA;  b. s/p CABG by Dr Roxan Hockey w/ LIMA-LAD, SVG-OM1-OM3, SVG-D1, SVG-PDA  . Disequilibrium 04/03/2012  . HTN (hypertension)   . Hyperlipidemia   . Internal hemorrhoids   . Medicare annual wellness visit, subsequent 03/16/2015   Follows with cardiology, Dr Stanford Breed, Follows with dermatology, Dr Altamese Cabal. Aged out of colonoscopy  . Moderate aortic stenosis    a. 08/2014 Echo: EF 55-60%, no rwma, Gr 1 DD, mod AS, mildly dil asc Ao, mild MR, sev dil LA.  Justin [name] PAF (paroxysmal atrial fibrillation) ([place])    a. Dx 08/2014;  b. CHA2DS2VASc = 4 - decision made to withold [place] 2/2 unsteady gait and syncope.  . Sigmoid diverticulitis   . Syncope    a. 08/2014 - ? orthostatic    Past Surgical History:  Procedure Laterality Date  . COLONOSCOPY    . CORONARY ARTERY BYPASS GRAFT  2000   Dr Roxan Hockey  LIMA-LAD, SVG-OM1-OM3, SVG-D1, SVG-PDA.   Justin [name] HIP ARTHROPLASTY Left 10/14/2015   Procedure: LEFT HIP HEMI ARTHROPLASTY;  Surgeon: [name], MD;  Location: [place];  Service: Orthopedics;  Laterality:  Left;  . SHOULDER SURGERY     x2    Current Outpatient Prescriptions  Medication Sig Dispense Refill  . acetaminophen (TYLENOL) 325 MG tablet Take 1-2 tablets (325-650 mg total) by mouth every 6 (six) hours as needed for mild pain or moderate pain. 40 tablet 0  . apixaban (ELIQUIS) 5 MG TABS tablet Take 1 tablet (5 mg total) by mouth 2 (two) times daily. 60 tablet 0  . CARTIA XT 120 MG 24 hr capsule TAKE ONE CAPSULE BY MOUTH ONCE DAILY 30 capsule 11  . cephALEXin (KEFLEX) 500 MG capsule Take 1 capsule (500 mg total) by mouth 2 (two) times daily. 14 capsule 0  . Cholecalciferol (VITAMIN D3) 5000 units CAPS Take 1 capsule by mouth daily.    . hydrochlorothiazide (MICROZIDE) 12.5 MG capsule Take  12.5 mg by mouth as needed.    . lovastatin (MEVACOR) 20 MG tablet TAKE ONE TABLET BY MOUTH ONCE DAILY AT BEDTIME 90 tablet 3  . Multiple Vitamin (MULTIVITAMIN) capsule Take 0.5 capsules by mouth daily.     . multivitamin-lutein (OCUVITE-LUTEIN) CAPS Take 1 capsule by mouth daily.     . Probiotic Product (PROBIOTIC DAILY PO) Take 1 capsule by mouth daily.     No current facility-administered medications for this visit.     Allergies:   Dutasteride; Finasteride; and Toradol [ketorolac tromethamine]    Social History:  The patient  reports that he quit smoking about 45 years ago. He has a 7.50 pack-year smoking history. He has never used smokeless tobacco. He reports that he drinks about 0.6 - 1.2 oz of alcohol per week . He reports that he does not use drugs.   Family History:  The patient's family history includes Alcohol abuse in his brother and father; Bipolar disorder in his mother; Cancer in his brother, maternal grandfather, and sister; Coronary artery disease in his father; Glaucoma in his sister; Heart attack in his father; Heart disease in his mother; Hyperlipidemia in his father.    ROS:  Please see the history of present illness. All other systems are reviewed and negative.    PHYSICAL EXAM: VS:  BP 122/60 (BP Location: Left Arm, Patient Position: Sitting, Cuff Size: Normal)   Pulse 68   Ht 5\' 5"  (1.651 m)   Wt 144 lb 4.8 oz (65.5 kg)   BMI 24.01 kg/m  , BMI Body mass index is 24.01 kg/m. GEN: Well nourished, well developed, male in no acute distress  HEENT: normal for age  Neck: Minimal JVD, no carotid bruit, no masses Cardiac: RRR; no murmur, no rubs, or gallops Respiratory:  clear to auscultation bilaterally, normal work of breathing GI: soft, nontender, nondistended, + BS MS: no deformity or atrophy; no edema; distal pulses are 2+ in all 4 extremities   Skin: warm and dry, no rash Neuro:  Strength and sensation are intact Psych: euthymic mood, full affect   EKG:   EKG is ordered today. The ekg ordered today demonstrates SR, HR 68, no acute ischemic changes   Recent Labs: 05/03/2016: ALT 13; TSH 4.34 07/16/2016: Hemoglobin 12.0; Platelets 703 07/17/2016: BUN 17; Creatinine, Ser 0.82; Potassium 4.1; Sodium 131    Lipid Panel    Component Value Date/Time   CHOL 178 01/19/2016 1601   TRIG 81 01/19/2016 1601   HDL 102 01/19/2016 1601   CHOLHDL 1.7 01/19/2016 1601   VLDL 16 01/19/2016 1601   LDLCALC 60 01/19/2016 1601     Wt Readings from Last 3 Encounters:  07/22/16  144 lb 4.8 oz (65.5 kg)  07/17/16 152 lb (68.9 kg)  07/16/16 152 lb (68.9 kg)     Other studies Reviewed: Additional studies/ records that were reviewed today include: Office notes, hospital records and testing.  ASSESSMENT AND PLAN:  1.  Paroxysmal atrial fibrillation: The episode on 01/19 could be considered to be provoked by his UTI. Since he got treatment for that and his antibiotics were adjusted, he has done much better and feels well. I am not convinced that his blood pressure would tolerate an increase in his Cardizem. Therefore, I will will make any medication changes at this time. He is to contact us for symptoms.  2. Chronic anticoagulation: He is compliant with the Eliquis and not having any bleeding issues. CHA2DS2VASc=4 (age x 2, CAD, HTN). Renal function was good by recent labs at the emergency room.  3. Hypertension: His blood pressure is under good control today. He is compliant with a low-sodium diet and feels well. No medication changes are indicated.  4. Shortness of breath: He had some problems with shortness of breath, but these dramatically improved once he was treated for urinary retention. He is to stay on the HCTZ, but it is okay to decrease it to 4 times a week as long as his weight does not change. He is at a dry weight by physical exam and he understands the need to keep it there.    Current medicines are reviewed at length with the patient today.  The  patient has concerns regarding medicines. Concerns were addressed  The following changes have been made:  Slightly decrease HCTZ  Labs/ tests ordered today include:   Orders Placed This Encounter  Procedures  . EKG 12-Lead     Disposition:   FU with Dr. Stanford Breed  Signed, Justin Ferries, PA-C  07/22/2016 4:35 PM    [place] Phone: [phone]; Fax: [phone]  This note was written with the assistance of speech recognition software. Please excuse any transcriptional errors.

## 2016-07-23 DIAGNOSIS — R338 Other retention of urine: Secondary | ICD-10-CM | POA: Diagnosis not present

## 2016-07-27 ENCOUNTER — Telehealth: Payer: Self-pay | Admitting: Family Medicine

## 2016-07-27 DIAGNOSIS — R338 Other retention of urine: Secondary | ICD-10-CM | POA: Diagnosis not present

## 2016-07-27 NOTE — Telephone Encounter (Signed)
Pt's wife dropped off document for PCP to see and have copies on pt's chart (Declaration of a Desire for Natural Death) Document put at front office tray.

## 2016-08-04 NOTE — Progress Notes (Signed)
HPI: FU CAD s/p CABG (2001) and atrial fibrillation. Monitor March 2016 showed sinus rhythm with PAF. Last echocardiogram July 2017 showed normal LV systolic function, grade 1 diastolic dysfunction, moderate aortic stenosis with mean gradient 18 mmHg and valve area 1.2 cm. There was mild left atrial enlargement and mild mitral regurgitation. ABIs 7/17 showed many vessels noncompressible. Had an episode of atrial fibrillation January of this year. Since last seen, patient denies dyspnea, chest pain or syncope. He has rare bouts of atrial fibrillation by his report. He is unsteady. He notes some dizziness with standing.  Current Outpatient Prescriptions  Medication Sig Dispense Refill  . acetaminophen (TYLENOL) 325 MG tablet Take 1-2 tablets (325-650 mg total) by mouth every 6 (six) hours as needed for mild pain or moderate pain. 40 tablet 0  . apixaban (ELIQUIS) 5 MG TABS tablet Take 1 tablet (5 mg total) by mouth 2 (two) times daily. 60 tablet 0  . CARTIA XT 120 MG 24 hr capsule TAKE ONE CAPSULE BY MOUTH ONCE DAILY 30 capsule 11  . Cholecalciferol (VITAMIN D3) 5000 units CAPS Take 1 capsule by mouth daily.    . ciprofloxacin (CIPRO) 500 MG tablet Take 1 tablet (500 mg total) by mouth 2 (two) times daily. 14 tablet 0  . lovastatin (MEVACOR) 20 MG tablet TAKE ONE TABLET BY MOUTH ONCE DAILY AT BEDTIME 90 tablet 3  . meclizine (ANTIVERT) 25 MG tablet Take 1 tablet (25 mg total) by mouth 3 (three) times daily as needed for dizziness. 30 tablet 0  . Multiple Vitamin (MULTIVITAMIN) capsule Take 0.5 capsules by mouth daily.     . multivitamin-lutein (OCUVITE-LUTEIN) CAPS Take 1 capsule by mouth daily.     . Probiotic Product (PROBIOTIC DAILY PO) Take 1 capsule by mouth daily.     No current facility-administered medications for this visit.      Past Medical History:  Diagnosis Date  . Basal cell carcinoma   . BPH (benign prostatic hypertrophy)   . CAD (coronary artery disease) 1991   a.  H/o MI w/ PTCA;  b. s/p CABG by Dr Roxan Hockey w/ LIMA-LAD, SVG-OM1-OM3, SVG-D1, SVG-PDA  . Disequilibrium 04/03/2012  . HTN (hypertension)   . Hyperlipidemia   . Internal hemorrhoids   . Medicare annual wellness visit, subsequent 03/16/2015   Follows with cardiology, Dr Stanford Breed, Follows with dermatology, Dr Altamese Cabal. Aged out of colonoscopy  . Moderate aortic stenosis    a. 08/2014 Echo: EF 55-60%, no rwma, Gr 1 DD, mod AS, mildly dil asc Ao, mild MR, sev dil LA.  [name] PAF (paroxysmal atrial fibrillation) ([place])    a. Dx 08/2014;  b. CHA2DS2VASc = 4 - decision made to withold [place] 2/2 unsteady gait and syncope.  . Sigmoid diverticulitis   . Syncope    a. 08/2014 - ? orthostatic    Past Surgical History:  Procedure Laterality Date  . COLONOSCOPY    . CORONARY ARTERY BYPASS GRAFT  2000   Dr Roxan Hockey  LIMA-LAD, SVG-OM1-OM3, SVG-D1, SVG-PDA.   [name] HIP ARTHROPLASTY Left 10/14/2015   Procedure: LEFT HIP HEMI ARTHROPLASTY;  Surgeon: [name], MD;  Location: [place];  Service: Orthopedics;  Laterality: Left;  . SHOULDER SURGERY     x2    Social History   Social History  . Marital status: Married    Spouse name: N/A  . Number of children: 2  . Years of education: N/A   Occupational History  . 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 Smoking status: Former Smoker    Packs/day: 0.50    Years: 15.00    Quit date: 06/29/1971  . Smokeless tobacco: Never Used  . Alcohol use 0.6 - 1.2 oz/week    1 - 2 Glasses of wine per week     Comment: alternates wine and liquor. 1/2 pint liquor/week  . Drug use: No  . Sexual activity: No     Comment: lives with wife, retired English as a second language teacher, no dietary restrictions   Other Topics Concern  . Not on file   Social History Narrative  . No narrative on file    Family History  Problem Relation Age of Onset  . Coronary artery disease Father   . Hyperlipidemia Father   . Heart attack Father   . Alcohol  abuse Father   . Bipolar disorder Mother   . Heart disease Mother   . Alcohol abuse Brother   . Cancer Brother   . Cancer Maternal Grandfather     oral cancer, chew tobacco  . Cancer Sister   . Glaucoma Sister   . Alcohol abuse      ROS: no fevers or chills, productive cough, hemoptysis, dysphasia, odynophagia, melena, hematochezia, dysuria, hematuria, rash, seizure activity, orthopnea, PND, pedal edema, claudication. Remaining systems are negative.  Physical Exam: Well-developed well-nourished in no acute distress.  Skin is warm and dry.  HEENT is normal.  Neck is supple.  Chest is clear to auscultation with normal expansion.  Cardiovascular exam is regular rate and rhythm. 3/6 systolic murmur, S2 preserved Abdominal exam nontender or distended. No masses palpated. Extremities show no edema. neuro grossly intact   A/P  1 coronary artery disease-continue statin. No aspirin given need for Coumadin.  2 hypertension- blood pressure elevated. However patient does have orthostatic symptoms which I feel would worsen with advancing his regimen.  Continue present medications and follow.  3 hyperlipidemia -continue statin.  4 Aortic stenosis-plan follow-up echocardiogram July 2018.   5 peripheral vascular disease-continue statin.   6 paroxysmal atrial fibrillation- continue apixaban. Continue cardizem.If he has more frequent bouts in the future we will consider adding amiodarone. He is unsteady and has fallen in the past. I discussed the importance of carrying a cane. If he has more frequent falls in the future we may need to discontinue anticoagulation as risk would outweigh benefit.   Kirk Ruths, MD

## 2016-08-05 ENCOUNTER — Encounter (HOSPITAL_BASED_OUTPATIENT_CLINIC_OR_DEPARTMENT_OTHER): Payer: Self-pay | Admitting: Emergency Medicine

## 2016-08-05 ENCOUNTER — Ambulatory Visit: Payer: PPO | Admitting: Family Medicine

## 2016-08-05 ENCOUNTER — Emergency Department (HOSPITAL_BASED_OUTPATIENT_CLINIC_OR_DEPARTMENT_OTHER): Payer: PPO

## 2016-08-05 ENCOUNTER — Emergency Department (HOSPITAL_BASED_OUTPATIENT_CLINIC_OR_DEPARTMENT_OTHER)
Admission: EM | Admit: 2016-08-05 | Discharge: 2016-08-05 | Disposition: A | Discharge status: Home / Self Care | Payer: PPO | Attending: Emergency Medicine | Admitting: Emergency Medicine

## 2016-08-05 DIAGNOSIS — I2510 Atherosclerotic heart disease of native coronary artery without angina pectoris: Secondary | ICD-10-CM | POA: Diagnosis not present

## 2016-08-05 DIAGNOSIS — I10 Essential (primary) hypertension: Secondary | ICD-10-CM | POA: Insufficient documentation

## 2016-08-05 DIAGNOSIS — R531 Weakness: Secondary | ICD-10-CM | POA: Diagnosis not present

## 2016-08-05 DIAGNOSIS — Z79899 Other long term (current) drug therapy: Secondary | ICD-10-CM | POA: Insufficient documentation

## 2016-08-05 DIAGNOSIS — Z87891 Personal history of nicotine dependence: Secondary | ICD-10-CM | POA: Insufficient documentation

## 2016-08-05 DIAGNOSIS — M6281 Muscle weakness (generalized): Secondary | ICD-10-CM | POA: Diagnosis not present

## 2016-08-05 DIAGNOSIS — R42 Dizziness and giddiness: Secondary | ICD-10-CM

## 2016-08-05 DIAGNOSIS — N3001 Acute cystitis with hematuria: Secondary | ICD-10-CM | POA: Insufficient documentation

## 2016-08-05 LAB — COMPREHENSIVE METABOLIC PANEL
ALT: 14 U/L — ABNORMAL LOW (ref 17–63)
ANION GAP: 7 (ref 5–15)
AST: 22 U/L (ref 15–41)
Albumin: 3.4 g/dL — ABNORMAL LOW (ref 3.5–5.0)
Alkaline Phosphatase: 60 U/L (ref 38–126)
BUN: 14 mg/dL (ref 6–20)
CHLORIDE: 99 mmol/L — AB (ref 101–111)
CO2: 27 mmol/L (ref 22–32)
Calcium: 8.9 mg/dL (ref 8.9–10.3)
Creatinine, Ser: 0.83 mg/dL (ref 0.61–1.24)
GFR calc Af Amer: >60 mL/min (ref >60)
Glucose, Bld: 112 mg/dL — ABNORMAL HIGH (ref 65–99)
Potassium: 3.9 mmol/L (ref 3.5–5.1)
Sodium: 133 mmol/L — ABNORMAL LOW (ref 135–145)
Total Bilirubin: 0.8 mg/dL (ref 0.3–1.2)
Total Protein: 6.9 g/dL (ref 6.5–8.1)

## 2016-08-05 LAB — URINALYSIS, ROUTINE W REFLEX MICROSCOPIC
Bilirubin Urine: NEGATIVE
Glucose, UA: NEGATIVE mg/dL
Ketones, ur: NEGATIVE mg/dL
Nitrite: NEGATIVE
Protein, ur: 100 mg/dL — AB
SPECIFIC GRAVITY, URINE: 1.01 (ref 1.005–1.030)
pH: 7 (ref 5.0–8.0)

## 2016-08-05 LAB — CBC
HCT: 36.7 % — ABNORMAL LOW (ref 39.0–52.0)
Hemoglobin: 12.4 g/dL — ABNORMAL LOW (ref 13.0–17.0)
MCH: 28.1 pg (ref 26.0–34.0)
MCHC: 33.8 g/dL (ref 30.0–36.0)
MCV: 83.2 fL (ref 78.0–100.0)
Platelets: 366 10*3/uL (ref 150–400)
RBC: 4.41 MIL/uL (ref 4.22–5.81)
RDW: 15.5 % (ref 11.5–15.5)
WBC: 7.4 10*3/uL (ref 4.0–10.5)

## 2016-08-05 LAB — URINALYSIS, MICROSCOPIC (REFLEX)

## 2016-08-05 MED ORDER — SODIUM CHLORIDE 0.9 % IV BOLUS (SEPSIS)
250.0000 mL | Freq: Once | INTRAVENOUS | Status: AC
  Administered 2016-08-05: 250 mL via INTRAVENOUS

## 2016-08-05 MED ORDER — MECLIZINE HCL 25 MG PO TABS: 25.0000 mg | ORAL_TABLET | Freq: Three times a day (TID) | ORAL | 0 refills | Status: AC | PRN

## 2016-08-05 MED ORDER — SODIUM CHLORIDE 0.9 % IV SOLN
INTRAVENOUS | Status: DC
  Administered 2016-08-05: 11:00:00 via INTRAVENOUS

## 2016-08-05 MED ORDER — CIPROFLOXACIN HCL 500 MG PO TABS: 500.0000 mg | ORAL_TABLET | Freq: Two times a day (BID) | ORAL | 0 refills | Status: DC

## 2016-08-05 MED ORDER — MECLIZINE HCL 25 MG PO TABS
25.0000 mg | ORAL_TABLET | Freq: Once | ORAL | Status: AC
  Administered 2016-08-05: 25 mg via ORAL
  Filled 2016-08-05: qty 1

## 2016-08-05 MED FILL — CIPROFLOXACIN HCL 500 MG TA: 500 | 7 days supply | Qty: 14 | Fill #0

## 2016-08-05 MED FILL — MECLIZINE 25 MG TABLET: 25 | 10 days supply | Qty: 30 | Fill #0

## 2016-08-05 NOTE — ED Notes (Signed)
ED Provider at bedside. 

## 2016-08-05 NOTE — ED Triage Notes (Signed)
Woke up at 630 this morning with dizziness and difficulty walking. Pt went to bed at 10 last night and felt normal. Pt alert and oriented.

## 2016-08-05 NOTE — ED Notes (Signed)
amb with one assist w/o difficulty, wife states he is walking  Much better than this am

## 2016-08-05 NOTE — Discharge Instructions (Signed)
Workup here to include the chest x-ray head CT and lab workup without any significant findings. Urinalysis is not normal sent for urine culture could represent may be early infection. Take the antibiotic as directed. Also take the Antivert as needed for the dizziness. Return for any new or worse symptoms. Make an appointment to follow-up with your regular Dr. for recheck.

## 2016-08-05 NOTE — ED Provider Notes (Signed)
[facility] Provider Note   CSN: [id number] Arrival date & time: 08/05/16  1034     History   Chief Complaint Chief Complaint  Patient presents with  . Dizziness    HPI Justin [name] is a 83 y.o. male.  Patient has a history of atrial fibrillation and is on blood thinners. Patient awoke this morning had bilateral leg weakness and some tremors in his legs. Arms were fine. Speech was fine. No headache. But did have some dizziness no room spinning. Patient has had the dizziness in the past he hadn't had the leg weakness they would not come in. Patient otherwise was feeling fine yesterday. Did exercise some but didn't do anything out of the ordinary. Patient has indwelling Foley catheter due to voiding problems. Patient in January was treated with Keflex for questionable urinary tract infection. Patient doesn't have any new symptoms in the GU area.      Past Medical History:  Diagnosis Date  . Basal cell carcinoma   . BPH (benign prostatic hypertrophy)   . CAD (coronary artery disease) 1991   a. H/o MI w/ PTCA;  b. s/p CABG by Dr Roxan Hockey w/ LIMA-LAD, SVG-OM1-OM3, SVG-D1, SVG-PDA  . Disequilibrium 04/03/2012  . HTN (hypertension)   . Hyperlipidemia   . Internal hemorrhoids   . Medicare annual wellness visit, subsequent 03/16/2015   Follows with cardiology, Dr Stanford Breed, Follows with dermatology, Dr Altamese Cabal. Aged out of colonoscopy  . Moderate aortic stenosis    a. 08/2014 Echo: EF 55-60%, no rwma, Gr 1 DD, mod AS, mildly dil asc Ao, mild MR, sev dil LA.  [name] PAF (paroxysmal atrial fibrillation) ([place])    a. Dx 08/2014;  b. CHA2DS2VASc = 4 - decision made to withold [place] 2/2 unsteady gait and syncope.  . Sigmoid diverticulitis   . Syncope    a. 08/2014 - ? orthostatic    Patient Active Problem List   Diagnosis Date Noted  . Fall   . Closed left hip fracture ([place]) 10/13/2015  . Medicare annual wellness visit, subsequent 03/16/2015  . Paroxysmal atrial fibrillation  ([place]) 09/26/2014  . Orthostatic hypotension 09/15/2014  . Syncope   . Head trauma 09/14/2014  . Alteration in anticoagulation 09/14/2014  . Tinnitus 03/21/2014  . Epigastric pain 02/01/2013  . Gait instability 01/01/2013  . Disequilibrium 04/03/2012  . Hyperglycemia 09/13/2011  . Aortic stenosis 03/10/2011  . CAD, ARTERY BYPASS GRAFT 02/23/2010  . Hyperlipidemia 07/04/2008  . Essential hypertension 07/04/2008  . Coronary atherosclerosis 07/04/2008  . RHINITIS 07/04/2008  . BENIGN PROSTATIC HYPERTROPHY, WITH OBSTRUCTION 07/04/2008    Past Surgical History:  Procedure Laterality Date  . COLONOSCOPY    . CORONARY ARTERY BYPASS GRAFT  2000   Dr Roxan Hockey  LIMA-LAD, SVG-OM1-OM3, SVG-D1, SVG-PDA.   [name] HIP ARTHROPLASTY Left 10/14/2015   Procedure: LEFT HIP HEMI ARTHROPLASTY;  Surgeon: [name], MD;  Location: [facility];  Service: Orthopedics;  Laterality: Left;  . SHOULDER SURGERY     x2       Home Medications    Prior to Admission medications   Medication Sig Start Date End Date Taking? Authorizing Provider  acetaminophen (TYLENOL) 325 MG tablet Take 1-2 tablets (325-650 mg total) by mouth every 6 (six) hours as needed for mild pain or moderate pain. 10/15/15   Loni Dolly, PA-C  apixaban (ELIQUIS) 5 MG TABS tablet Take 1 tablet (5 mg total) by mouth 2 (two) times daily. 10/15/15   Loni Dolly, PA-C  CARTIA XT 120 MG  24 hr capsule TAKE ONE CAPSULE BY MOUTH ONCE DAILY 08/13/15   Larey Dresser, MD  cephALEXin (KEFLEX) 500 MG capsule Take 1 capsule (500 mg total) by mouth 2 (two) times daily. 07/16/16   John Molpus, MD  Cholecalciferol (VITAMIN D3) 5000 units CAPS Take 1 capsule by mouth daily.    Historical Provider, MD  ciprofloxacin (CIPRO) 500 MG tablet Take 1 tablet (500 mg total) by mouth 2 (two) times daily. 08/05/16   Fredia Sorrow, MD  hydrochlorothiazide (MICROZIDE) 12.5 MG capsule Take 1 capsule (12.5 mg total) by mouth 4 (four) times a week. Take on Mondays, Wednesdays,  Fridays, and Saturdays 07/22/16   Evelene Croon Barrett, PA-C  lovastatin (MEVACOR) 20 MG tablet TAKE ONE TABLET BY MOUTH ONCE DAILY AT BEDTIME 01/20/16   Larey Dresser, MD  meclizine (ANTIVERT) 25 MG tablet Take 1 tablet (25 mg total) by mouth 3 (three) times daily as needed for dizziness. 08/05/16   Fredia Sorrow, MD  Multiple Vitamin (MULTIVITAMIN) capsule Take 0.5 capsules by mouth daily.     Historical Provider, MD  multivitamin-lutein [facility]) CAPS Take 1 capsule by mouth daily.     Historical Provider, MD  Probiotic Product (PROBIOTIC DAILY PO) Take 1 capsule by mouth daily.    Historical Provider, MD    Family History Family History  Problem Relation Age of Onset  . Coronary artery disease Father   . Hyperlipidemia Father   . Heart attack Father   . Alcohol abuse Father   . Bipolar disorder Mother   . Heart disease Mother   . Alcohol abuse Brother   . Cancer Brother   . Cancer Maternal Grandfather     oral cancer, chew tobacco  . Cancer Sister   . Glaucoma Sister   . Alcohol abuse      Social History Social History  Substance Use Topics  . Smoking status: Former Smoker    Packs/day: 0.50    Years: 15.00    Quit date: 06/29/1971  . Smokeless tobacco: Never Used  . Alcohol use 0.6 - 1.2 oz/week    1 - 2 Glasses of wine per week     Comment: alternates wine and liquor. 1/2 pint liquor/week     Allergies   Dutasteride; Finasteride; and Toradol [ketorolac tromethamine]   Review of Systems Review of Systems  Constitutional: Negative for fever.  HENT: Negative for congestion.   Eyes: Negative for visual disturbance.  Respiratory: Negative for shortness of breath.   Cardiovascular: Negative for chest pain.  Gastrointestinal: Negative for abdominal pain.  Genitourinary: Positive for difficulty urinating. Negative for dysuria.  Musculoskeletal: Negative for back pain.  Skin: Negative for rash.  Neurological: Positive for tremors and weakness. Negative for  headaches.  Hematological: Bruises/bleeds easily.  Psychiatric/Behavioral: Negative for confusion.     Physical Exam Updated Vital Signs BP 148/77 (BP Location: Right Arm)   Pulse 60   Temp 97.8 F (36.6 C) (Oral)   Resp 18   Ht 5\' 5"  (1.651 m)   Wt 63 kg   SpO2 97%   BMI 23.13 kg/m   Physical Exam  Constitutional: He is oriented to person, place, and time. He appears well-developed and well-nourished. No distress.  HENT:  Head: Normocephalic and atraumatic.  Mouth/Throat: Oropharynx is clear and moist.  Eyes: EOM are normal. Pupils are equal, round, and reactive to light.  Neck: Normal range of motion. Neck supple.  Cardiovascular: Normal rate, regular rhythm and normal heart sounds.   Pulmonary/Chest: Effort  normal and breath sounds normal. No respiratory distress.  Abdominal: Soft. Bowel sounds are normal. There is no tenderness.  Musculoskeletal: Normal range of motion. He exhibits no edema.  Neurological: He is alert and oriented to person, place, and time. No cranial nerve deficit. He exhibits normal muscle tone. Coordination normal.  Skin: Skin is warm. No rash noted.  Nursing note and vitals reviewed.    ED Treatments / Results  Labs (all labs ordered are listed, but only abnormal results are displayed) Labs Reviewed  CBC - Abnormal; Notable for the following:       Result Value   Hemoglobin 12.4 (*)    HCT 36.7 (*)    All other components within normal limits  COMPREHENSIVE METABOLIC PANEL - Abnormal; Notable for the following:    Sodium 133 (*)    Chloride 99 (*)    Glucose, Bld 112 (*)    Albumin 3.4 (*)    ALT 14 (*)    All other components within normal limits  URINALYSIS, ROUTINE W REFLEX MICROSCOPIC - Abnormal; Notable for the following:    APPearance CLOUDY (*)    Hgb urine dipstick LARGE (*)    Protein, ur 100 (*)    Leukocytes, UA LARGE (*)    All other components within normal limits  URINALYSIS, MICROSCOPIC (REFLEX) - Abnormal; Notable for  the following:    Bacteria, UA RARE (*)    Squamous Epithelial / LPF 0-5 (*)    All other components within normal limits  URINE CULTURE    EKG  EKG Interpretation  Date/Time:  Thursday August 05 2016 11:15:51 EST Ventricular Rate:  62 PR Interval:    QRS Duration: 95 QT Interval:  411 QTC Calculation: 418 R Axis:   -40 Text Interpretation:  Sinus rhythm Probable left atrial enlargement Left axis deviation Abnormal R-wave progression, late transition No significant change since last tracing Artifact Confirmed by Mykel Mohl  MD, Izic Stfort [id number]) on 08/05/2016 11:18:46 AM       Radiology Dg Chest 2 View  Result Date: 08/05/2016 CLINICAL DATA:  Weakness in the legs. EXAM: CHEST  2 VIEW COMPARISON:  10/13/2015 FINDINGS: Extensive calcified lymph nodes in the mediastinum and hila, as on prior exams. Atherosclerotic calcification of the aortic arch. Prior CABG. Borderline enlargement of the cardiopericardial silhouette, without edema. Scarring laterally at the left lung base, stable. There are no signs of pneumomediastinum on the frontal projection. However, on the lateral view there is unusual gas density projecting over the sternum and both posterior and anterior to the sternum. I suspect that this may be due to angulation. IMPRESSION: 1. Unusual gas density projecting around the sternum on the lateral projection is probably due to rotation. I do not see any pneumomediastinum on the frontal view. Correlate with any unusual features of palpation along the sternum, or abnormal auditory features loosening in this vicinity. 2. Extensive bilateral mediastinal and hilar calcified lymph nodes, unchanged. 3.  Atherosclerotic calcification of the aortic arch. 4. Chronic scarring at the left lung base. Electronically Signed   By: Van Clines M.D.   On: 08/05/2016 13:52   Ct Head Wo Contrast  Result Date: 08/05/2016 CLINICAL DATA:  Dizziness with difficulty walking starting this morning. EXAM: CT HEAD  WITHOUT CONTRAST TECHNIQUE: Contiguous axial images were obtained from the base of the skull through the vertex without intravenous contrast. COMPARISON:  03/23/2016 FINDINGS: Brain: There is no evidence for acute hemorrhage, hydrocephalus, mass lesion, or abnormal extra-axial fluid collection. No definite CT evidence  for acute infarction. Diffuse loss of parenchymal volume is consistent with atrophy. Patchy low attenuation in the deep hemispheric and periventricular white matter is nonspecific, but likely reflects chronic microvascular ischemic demyelination. Vascular: Atherosclerotic calcification is visualized in the carotid arteries. No dense MCA sign. Major dural sinuses are unremarkable. Skull: No evidence for fracture. No worrisome lytic or sclerotic lesion. Sinuses/Orbits: The visualized paranasal sinuses and mastoid air cells are clear. Visualized portions of the globes and intraorbital fat are unremarkable. Other: None. IMPRESSION: 1. Stable exam.  No acute intracranial abnormality. 2. Atrophy with chronic small vessel white matter ischemic disease. Electronically Signed   By: Misty Stanley M.D.   On: 08/05/2016 12:44    Procedures Procedures (including critical care time)  Medications Ordered in ED Medications  0.9 %  sodium chloride infusion ( Intravenous New Bag/Given 08/05/16 1118)  sodium chloride 0.9 % bolus 250 mL (0 mLs Intravenous Stopped 08/05/16 1340)  meclizine (ANTIVERT) tablet 25 mg (25 mg Oral Given 08/05/16 1115)     Initial Impression / Assessment and Plan / ED Course  I have reviewed the triage vital signs and the nursing notes.  Pertinent labs & imaging results that were available during my care of the patient were reviewed by me and considered in my medical decision making (see chart for details).     Patient awoke this morning with weakness in his legs bilaterally feeling jittery and having dizziness but no true room spinning. Patient's had the dizziness in the past  several times. But they were concerned about the leg weakness. Did not do anything new or different yesterday. Patient has an indwelling Foley catheter. Patient in January was treated for urinary tract infection  Patient head CT negative labs negative chest x-ray negative. Patient given some Antivert. Patient feeling much better. Patient now ambulating normal. No concerns stroke at this time. We'll continue the Antivert sent urine for culture and will go ahead and treat prophylactically with Cipro and have him follow-up with his doctors. They will return for any new or worse symptoms.  Final Clinical Impressions(s) / ED Diagnoses   Final diagnoses:  Dizziness  Weakness  Acute cystitis with hematuria    New Prescriptions New Prescriptions   CIPROFLOXACIN (CIPRO) 500 MG TABLET    Take 1 tablet (500 mg total) by mouth 2 (two) times daily.   MECLIZINE (ANTIVERT) 25 MG TABLET    Take 1 tablet (25 mg total) by mouth 3 (three) times daily as needed for dizziness.     Fredia Sorrow, MD 08/05/16 [id number]

## 2016-08-07 LAB — URINE CULTURE: Culture: 80000 — AB

## 2016-08-08 ENCOUNTER — Telehealth: Payer: Self-pay

## 2016-08-08 NOTE — Progress Notes (Signed)
ED Antimicrobial Stewardship Positive Culture Follow Up   Justin [name] is an 83 y.o. male who presented to [facility] on 08/05/2016 with a chief complaint of  Chief Complaint  Patient presents with  . Dizziness    Recent Results (from the past 720 hour(s))  Urine culture     Status: Abnormal   Collection Time: 07/16/16  3:25 AM  Result Value Ref Range Status   Specimen Description URINE, RANDOM  Final   Special Requests NONE  Final   Culture >=100,000 COLONIES/mL ENTEROCOCCUS FAECALIS (A)  Final   Report Status 07/18/2016 FINAL  Final   Organism ID, Bacteria ENTEROCOCCUS FAECALIS (A)  Final      Susceptibility   Enterococcus faecalis - MIC*    AMPICILLIN <=2 SENSITIVE Sensitive     LEVOFLOXACIN >=8 RESISTANT Resistant     NITROFURANTOIN <=16 SENSITIVE Sensitive     VANCOMYCIN <=0.5 SENSITIVE Sensitive     * >=100,000 COLONIES/mL ENTEROCOCCUS FAECALIS  Urine culture     Status: None   Collection Time: 07/17/16  9:19 AM  Result Value Ref Range Status   Specimen Description URINE, RANDOM  Final   Special Requests NONE  Final   Culture   Final    NO GROWTH Performed at [facility], [address], [place], [place] [address]    Report Status 07/18/2016 FINAL  Final  Urine culture     Status: Abnormal   Collection Time: 08/05/16 10:58 AM  Result Value Ref Range Status   Specimen Description URINE, CLEAN CATCH  Final   Special Requests NONE  Final   Culture 80,000 COLONIES/mL ENTEROCOCCUS FAECALIS (A)  Final   Report Status 08/07/2016 FINAL  Final   Organism ID, Bacteria ENTEROCOCCUS FAECALIS (A)  Final      Susceptibility   Enterococcus faecalis - MIC*    AMPICILLIN <=2 SENSITIVE Sensitive     LEVOFLOXACIN >=8 RESISTANT Resistant     NITROFURANTOIN <=16 SENSITIVE Sensitive     VANCOMYCIN 1 SENSITIVE Sensitive     * 80,000 COLONIES/mL ENTEROCOCCUS FAECALIS    [x]  Treated with ciprofloxacini, organism resistant to prescribed antimicrobial []  Patient discharged  originally without antimicrobial agent and treatment is now indicated  New antibiotic prescription: DC ciprofloxacin, start amoxicillin 500mg  PO BID x 10 days  ED Provider: Reece Agar, PA   Temiloluwa Recchia, Rande Lawman 08/08/2016, 10:48 AM Clinical Pharmacist Phone# [phone]

## 2016-08-09 DIAGNOSIS — R310 Gross hematuria: Secondary | ICD-10-CM | POA: Diagnosis not present

## 2016-08-11 ENCOUNTER — Ambulatory Visit (INDEPENDENT_AMBULATORY_CARE_PROVIDER_SITE_OTHER): Payer: PPO | Admitting: Cardiology

## 2016-08-11 ENCOUNTER — Encounter: Payer: Self-pay | Admitting: Cardiology

## 2016-08-11 VITALS — BP 160/77 | HR 74 | Ht 65.0 in | Wt 143.0 lb

## 2016-08-11 DIAGNOSIS — I10 Essential (primary) hypertension: Secondary | ICD-10-CM | POA: Diagnosis not present

## 2016-08-11 DIAGNOSIS — I2510 Atherosclerotic heart disease of native coronary artery without angina pectoris: Secondary | ICD-10-CM

## 2016-08-11 DIAGNOSIS — E7800 Pure hypercholesterolemia, unspecified: Secondary | ICD-10-CM

## 2016-08-11 DIAGNOSIS — I480 Paroxysmal atrial fibrillation: Secondary | ICD-10-CM | POA: Diagnosis not present

## 2016-08-11 NOTE — Patient Instructions (Signed)
Your physician recommends that you schedule a follow-up appointment in: 3 MONTHS WITH DR CRENSHAW  

## 2016-08-12 ENCOUNTER — Telehealth (HOSPITAL_BASED_OUTPATIENT_CLINIC_OR_DEPARTMENT_OTHER): Payer: Self-pay

## 2016-08-12 NOTE — Telephone Encounter (Signed)
Per pt and wife he has completed Cipro and seems to have improved Per wif pt has appt tomorrow with [facility] and they will let urologist make decision as to if another abx is needed

## 2016-08-13 DIAGNOSIS — N401 Enlarged prostate with lower urinary tract symptoms: Secondary | ICD-10-CM | POA: Diagnosis not present

## 2016-08-13 DIAGNOSIS — R3914 Feeling of incomplete bladder emptying: Secondary | ICD-10-CM | POA: Diagnosis not present

## 2016-08-20 DIAGNOSIS — R3914 Feeling of incomplete bladder emptying: Secondary | ICD-10-CM | POA: Diagnosis not present

## 2016-08-20 DIAGNOSIS — N401 Enlarged prostate with lower urinary tract symptoms: Secondary | ICD-10-CM | POA: Diagnosis not present

## 2016-08-22 ENCOUNTER — Other Ambulatory Visit: Payer: Self-pay | Admitting: Cardiology

## 2016-08-23 NOTE — Telephone Encounter (Signed)
Rx(s) sent to pharmacy electronically.  

## 2016-08-24 ENCOUNTER — Ambulatory Visit: Payer: PPO | Admitting: Family Medicine

## 2016-08-24 DIAGNOSIS — R338 Other retention of urine: Secondary | ICD-10-CM | POA: Diagnosis not present

## 2016-09-01 DIAGNOSIS — R972 Elevated prostate specific antigen [PSA]: Secondary | ICD-10-CM | POA: Diagnosis not present

## 2016-09-01 DIAGNOSIS — N400 Enlarged prostate without lower urinary tract symptoms: Secondary | ICD-10-CM | POA: Diagnosis not present

## 2016-09-16 DIAGNOSIS — N401 Enlarged prostate with lower urinary tract symptoms: Secondary | ICD-10-CM | POA: Diagnosis not present

## 2016-09-16 DIAGNOSIS — R338 Other retention of urine: Secondary | ICD-10-CM | POA: Diagnosis not present

## 2016-09-22 ENCOUNTER — Other Ambulatory Visit: Payer: Self-pay | Admitting: *Deleted

## 2016-09-22 ENCOUNTER — Other Ambulatory Visit: Payer: Self-pay | Admitting: Urology

## 2016-09-22 ENCOUNTER — Telehealth: Payer: Self-pay | Admitting: *Deleted

## 2016-09-22 DIAGNOSIS — I2510 Atherosclerotic heart disease of native coronary artery without angina pectoris: Secondary | ICD-10-CM

## 2016-09-22 DIAGNOSIS — I350 Nonrheumatic aortic (valve) stenosis: Secondary | ICD-10-CM

## 2016-09-22 NOTE — Telephone Encounter (Signed)
Per dr Stanford Breed, the patient will need an echo and lexiscan myoview prior to surgery. Spoke with pt wife, testing scheduled. Date and time of appointments discussed and instructions for lexiscan mailed to the patient.

## 2016-09-22 NOTE — Telephone Encounter (Signed)
Per dr Stanford Breed, hold eliquis 2 days prior to procedure and resume after when okay with surgery faxed to the number provided. Patient is having robotic simple prostatectomy.

## 2016-09-27 ENCOUNTER — Ambulatory Visit: Payer: PPO | Admitting: *Deleted

## 2016-09-29 ENCOUNTER — Telehealth (HOSPITAL_COMMUNITY): Payer: Self-pay

## 2016-09-29 NOTE — Telephone Encounter (Signed)
Encounter complete. 

## 2016-09-30 ENCOUNTER — Encounter (HOSPITAL_COMMUNITY): Payer: Self-pay | Admitting: *Deleted

## 2016-10-01 ENCOUNTER — Ambulatory Visit (HOSPITAL_COMMUNITY)
Admission: RE | Admit: 2016-10-01 | Discharge: 2016-10-01 | Disposition: A | Discharge status: Home / Self Care | Payer: PPO | Source: Ambulatory Visit | Attending: Cardiology | Admitting: Cardiology

## 2016-10-01 DIAGNOSIS — Z87891 Personal history of nicotine dependence: Secondary | ICD-10-CM | POA: Insufficient documentation

## 2016-10-01 DIAGNOSIS — R0609 Other forms of dyspnea: Secondary | ICD-10-CM | POA: Insufficient documentation

## 2016-10-01 DIAGNOSIS — I252 Old myocardial infarction: Secondary | ICD-10-CM | POA: Insufficient documentation

## 2016-10-01 DIAGNOSIS — I350 Nonrheumatic aortic (valve) stenosis: Secondary | ICD-10-CM | POA: Insufficient documentation

## 2016-10-01 DIAGNOSIS — Z8249 Family history of ischemic heart disease and other diseases of the circulatory system: Secondary | ICD-10-CM | POA: Insufficient documentation

## 2016-10-01 DIAGNOSIS — R42 Dizziness and giddiness: Secondary | ICD-10-CM | POA: Insufficient documentation

## 2016-10-01 DIAGNOSIS — R9439 Abnormal result of other cardiovascular function study: Secondary | ICD-10-CM | POA: Diagnosis not present

## 2016-10-01 DIAGNOSIS — I480 Paroxysmal atrial fibrillation: Secondary | ICD-10-CM | POA: Diagnosis not present

## 2016-10-01 DIAGNOSIS — I2510 Atherosclerotic heart disease of native coronary artery without angina pectoris: Secondary | ICD-10-CM | POA: Diagnosis not present

## 2016-10-01 DIAGNOSIS — Z951 Presence of aortocoronary bypass graft: Secondary | ICD-10-CM | POA: Insufficient documentation

## 2016-10-01 DIAGNOSIS — I10 Essential (primary) hypertension: Secondary | ICD-10-CM | POA: Diagnosis not present

## 2016-10-01 LAB — MYOCARDIAL PERFUSION IMAGING
CHL CUP NUCLEAR SDS: 7
NUC STRESS TID: 1.11
Peak HR: 121 {beats}/min
Rest HR: 90 {beats}/min
SRS: 3
SSS: 10

## 2016-10-01 MED ORDER — TECHNETIUM TC 99M TETROFOSMIN IV KIT
10.2000 | PACK | Freq: Once | INTRAVENOUS | Status: AC | PRN
  Administered 2016-10-01: 10.2 via INTRAVENOUS
  Filled 2016-10-01: qty 11

## 2016-10-01 MED ORDER — AMINOPHYLLINE 25 MG/ML IV SOLN
75.0000 mg | Freq: Once | INTRAVENOUS | Status: AC
  Administered 2016-10-01: 75 mg via INTRAVENOUS

## 2016-10-01 MED ORDER — TECHNETIUM TC 99M TETROFOSMIN IV KIT
32.0000 | PACK | Freq: Once | INTRAVENOUS | Status: AC | PRN
  Administered 2016-10-01: 32 via INTRAVENOUS
  Filled 2016-10-01: qty 32

## 2016-10-01 MED ORDER — REGADENOSON 0.4 MG/5ML IV SOLN
0.4000 mg | Freq: Once | INTRAVENOUS | Status: AC
  Administered 2016-10-01: 0.4 mg via INTRAVENOUS

## 2016-10-06 ENCOUNTER — Ambulatory Visit (HOSPITAL_COMMUNITY): Payer: PPO | Attending: Cardiology

## 2016-10-06 ENCOUNTER — Other Ambulatory Visit: Payer: Self-pay

## 2016-10-06 DIAGNOSIS — Z951 Presence of aortocoronary bypass graft: Secondary | ICD-10-CM | POA: Diagnosis not present

## 2016-10-06 DIAGNOSIS — I350 Nonrheumatic aortic (valve) stenosis: Secondary | ICD-10-CM

## 2016-10-06 DIAGNOSIS — I4891 Unspecified atrial fibrillation: Secondary | ICD-10-CM | POA: Diagnosis not present

## 2016-10-06 DIAGNOSIS — Z85828 Personal history of other malignant neoplasm of skin: Secondary | ICD-10-CM | POA: Diagnosis not present

## 2016-10-06 DIAGNOSIS — I080 Rheumatic disorders of both mitral and aortic valves: Secondary | ICD-10-CM | POA: Diagnosis not present

## 2016-10-06 DIAGNOSIS — L570 Actinic keratosis: Secondary | ICD-10-CM | POA: Diagnosis not present

## 2016-10-06 DIAGNOSIS — I10 Essential (primary) hypertension: Secondary | ICD-10-CM | POA: Insufficient documentation

## 2016-10-06 DIAGNOSIS — E785 Hyperlipidemia, unspecified: Secondary | ICD-10-CM | POA: Insufficient documentation

## 2016-10-06 DIAGNOSIS — L814 Other melanin hyperpigmentation: Secondary | ICD-10-CM | POA: Diagnosis not present

## 2016-10-06 DIAGNOSIS — I2510 Atherosclerotic heart disease of native coronary artery without angina pectoris: Secondary | ICD-10-CM | POA: Diagnosis not present

## 2016-10-06 DIAGNOSIS — Z08 Encounter for follow-up examination after completed treatment for malignant neoplasm: Secondary | ICD-10-CM | POA: Diagnosis not present

## 2016-10-06 DIAGNOSIS — D485 Neoplasm of uncertain behavior of skin: Secondary | ICD-10-CM | POA: Diagnosis not present

## 2016-10-06 DIAGNOSIS — D0439 Carcinoma in situ of skin of other parts of face: Secondary | ICD-10-CM | POA: Diagnosis not present

## 2016-10-08 ENCOUNTER — Encounter: Payer: Self-pay | Admitting: Family Medicine

## 2016-10-08 ENCOUNTER — Ambulatory Visit (INDEPENDENT_AMBULATORY_CARE_PROVIDER_SITE_OTHER): Payer: PPO | Admitting: Family Medicine

## 2016-10-08 ENCOUNTER — Telehealth: Payer: Self-pay | Admitting: Family Medicine

## 2016-10-08 DIAGNOSIS — N400 Enlarged prostate without lower urinary tract symptoms: Secondary | ICD-10-CM | POA: Diagnosis not present

## 2016-10-08 DIAGNOSIS — R739 Hyperglycemia, unspecified: Secondary | ICD-10-CM

## 2016-10-08 DIAGNOSIS — I10 Essential (primary) hypertension: Secondary | ICD-10-CM | POA: Diagnosis not present

## 2016-10-08 DIAGNOSIS — E7800 Pure hypercholesterolemia, unspecified: Secondary | ICD-10-CM | POA: Diagnosis not present

## 2016-10-08 NOTE — Progress Notes (Signed)
Patient ID: Justin [name], male   DOB: [birth year], 83 y.o.   MRN: [id number]   Subjective:  I acted as a Education administrator for Justin Homans, MD. Justin [name], [place]   Patient ID: Justin [name], male    DOB: [birth year], 83 y.o.   MRN: [id number]  Chief Complaint  Patient presents with  . Follow-up    Hypertension  This is a chronic problem. The problem is controlled. Pertinent negatives include no blurred vision, chest pain, headaches, malaise/fatigue, palpitations or shortness of breath. Risk factors for coronary artery disease include male gender.    Patient is in today for a 64-month follow up. Patient states that he has an indwelling catheter and due to have prostate surgery in May 2018. Patient has a Hx of HTN, benign prostatic hypertrophy with obstruction, syncope. Patient has no acute concerns noted at this time. He feels well today. No recent febrile illness or hospitalizations. Denies CP/palp/SOB/HA/congestion/fevers/GI c/o. Taking meds as prescribed  Patient Care Team: Justin Lukes, MD as PCP - General (Family Medicine) Justin Aloe, MD as Consulting Physician (Urology) Justin Fam, MD as Consulting Physician (Ophthalmology) Justin Partridge, DO as Consulting Physician (Neurology) Justin Perla, MD as Consulting Physician (Cardiology)   Past Medical History:  Diagnosis Date  . Basal cell carcinoma   . BPH (benign prostatic hypertrophy)   . CAD (coronary artery disease) 1991   a. H/o MI w/ PTCA;  b. s/p CABG by Justin [name] w/ LIMA-LAD, SVG-OM1-OM3, SVG-D1, SVG-PDA  . Disequilibrium 04/03/2012  . HTN (hypertension)   . Hyperlipidemia   . Internal hemorrhoids   . Medicare annual wellness visit, subsequent 03/16/2015   Follows with cardiology, Justin Justin [name], Follows with dermatology, Justin [name]. Aged out of colonoscopy  . Moderate aortic stenosis    a. 08/2014 Echo: EF 55-60%, no rwma, Gr 1 DD, mod AS, mildly dil asc Ao, mild MR, sev dil Justin.  [name] PAF (paroxysmal atrial fibrillation)  (Justin [name])    a. Dx 08/2014;  b. CHA2DS2VASc = 4 - decision made to withold Victor 2/2 unsteady gait and syncope.  . Sigmoid diverticulitis   . Syncope    a. 08/2014 - ? orthostatic    Past Surgical History:  Procedure Laterality Date  . COLONOSCOPY    . CORONARY ARTERY BYPASS GRAFT  2000   Justin [name]  LIMA-LAD, SVG-OM1-OM3, SVG-D1, SVG-PDA.   [name] HIP ARTHROPLASTY Left 10/14/2015   Procedure: LEFT HIP HEMI ARTHROPLASTY;  Surgeon: Justin Nakayama, MD;  Location: [facility];  Service: Orthopedics;  Laterality: Left;  . SHOULDER SURGERY     x2    Family History  Problem Relation Age of Onset  . Coronary artery disease Father   . Hyperlipidemia Father   . Heart attack Father   . Alcohol abuse Father   . Bipolar disorder Mother   . Heart disease Mother   . Alcohol abuse Brother   . Cancer Brother   . Cancer Maternal Grandfather     oral cancer, chew tobacco  . Cancer Sister   . Glaucoma Sister   . Alcohol abuse      Social History   Social History  . Marital status: Married    Spouse name: N/A  . Number of children: 2  . Years of education: N/A   Occupational History  . 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 Smoking status: Former Smoker    Packs/day: 0.50    Years: 15.00    Quit date: 06/29/1971  .  Smokeless tobacco: Never Used  . Alcohol use 0.6 - 1.2 oz/week    1 - 2 Glasses of wine per week     Comment: alternates wine and liquor. 1/2 pint liquor/week  . Drug use: No  . Sexual activity: No     Comment: lives with wife, retired English as a second language teacher, no dietary restrictions   Other Topics Concern  . Not on file   Social History Narrative  . No narrative on file    Outpatient Medications Prior to Visit  Medication Sig Dispense Refill  . acetaminophen (TYLENOL) 325 MG tablet Take 1-2 tablets (325-650 mg total) by mouth every 6 (six) hours as needed for mild pain or moderate pain. 40 tablet 0  . apixaban (ELIQUIS) 5 MG TABS tablet  Take 1 tablet (5 mg total) by mouth 2 (two) times daily. 60 tablet 0  . CARTIA XT 120 MG 24 hr capsule TAKE ONE CAPSULE BY MOUTH ONCE DAILY 90 capsule 3  . Cholecalciferol (VITAMIN D3) 5000 units CAPS Take 1 capsule by mouth daily.    [name] lovastatin (MEVACOR) 20 MG tablet TAKE ONE TABLET BY MOUTH ONCE DAILY AT BEDTIME 90 tablet 3  . meclizine (ANTIVERT) 25 MG tablet Take 1 tablet (25 mg total) by mouth 3 (three) times daily as needed for dizziness. 30 tablet 0  . Multiple Vitamin (MULTIVITAMIN) capsule Take 0.5 capsules by mouth daily.     . multivitamin-lutein (OCUVITE-LUTEIN) CAPS Take 1 capsule by mouth daily.     . Probiotic Product (PROBIOTIC DAILY PO) Take 1 capsule by mouth daily.    . ciprofloxacin (CIPRO) 500 MG tablet Take 1 tablet (500 mg total) by mouth 2 (two) times daily. (Patient not taking: Reported on 10/08/2016) 14 tablet 0   No facility-administered medications prior to visit.     Allergies  Allergen Reactions  . Dutasteride     GI upset and incontinence  . Toradol [Ketorolac Tromethamine] Other (See Comments)    CNS-AMS    Review of Systems  Constitutional: Negative for fever and malaise/fatigue.  HENT: Negative for congestion.   Eyes: Negative for blurred vision.  Respiratory: Negative for cough and shortness of breath.   Cardiovascular: Negative for chest pain, palpitations and leg swelling.  Gastrointestinal: Negative for vomiting.  Musculoskeletal: Negative for back pain.  Skin: Negative for rash.  Neurological: Negative for loss of consciousness and headaches.       Objective:    Physical Exam  Constitutional: He is oriented to person, place, and time. He appears well-developed and well-nourished. No distress.  HENT:  Head: Normocephalic and atraumatic.  Eyes: Conjunctivae are normal.  Neck: Normal range of motion. No thyromegaly present.  Cardiovascular: Normal rate and regular rhythm.   Pulmonary/Chest: Effort normal and breath sounds normal. He has  no wheezes.  Abdominal: Soft. Bowel sounds are normal. There is no tenderness.  Musculoskeletal: He exhibits no edema or deformity.  Neurological: He is alert and oriented to person, place, and time.  Skin: Skin is warm and dry. He is not diaphoretic.  Psychiatric: He has a normal mood and affect.    BP (!) 139/94 (BP Location: Left Arm, Patient Position: Sitting, Cuff Size: Normal)   Pulse (!) 108   Temp 98.2 F (36.8 C) (Oral)   Wt 141 lb 12.8 oz (64.3 kg)   SpO2 99% Comment: RA  BMI 23.60 kg/m  Wt Readings from Last 3 Encounters:  10/08/16 141 lb 12.8 oz (64.3 kg)  10/01/16 143 lb (64.9 kg)  08/11/16 143 lb (64.9 kg)   BP Readings from Last 3 Encounters:  10/08/16 (!) 139/94  08/11/16 (!) 160/77  08/05/16 157/88     Immunization History  Administered Date(s) Administered  . Influenza Split 04/03/2012  . Influenza Whole 06/10/2008, 03/28/2009  . Influenza, High Dose Seasonal PF 02/28/2015  . Influenza-Unspecified 03/29/2014, 03/30/2016  . Pneumococcal Conjugate-13 01/01/2015  . Pneumococcal Polysaccharide-23 01/16/2008  . Td 07/07/1999, 05/14/2009  . Zoster 02/11/2004, 04/26/2006    Health Maintenance  Topic Date Due  . INFLUENZA VACCINE  01/26/2017  . TETANUS/TDAP  05/15/2019  . PNA vac Low Risk Adult  Completed    Lab Results  Component Value Date   WBC 7.4 08/05/2016   HGB 12.4 (L) 08/05/2016   HCT 36.7 (L) 08/05/2016   PLT 366 08/05/2016   GLUCOSE 112 (H) 08/05/2016   CHOL 178 01/19/2016   TRIG 81 01/19/2016   HDL 102 01/19/2016   LDLCALC 60 01/19/2016   ALT 14 (L) 08/05/2016   AST 22 08/05/2016   NA 133 (L) 08/05/2016   K 3.9 08/05/2016   CL 99 (L) 08/05/2016   CREATININE 0.83 08/05/2016   BUN 14 08/05/2016   CO2 27 08/05/2016   TSH 4.34 05/03/2016   PSA 10.24 (H) 02/06/2009   INR 1.35 10/13/2015   HGBA1C 5.2 05/03/2016    Lab Results  Component Value Date   TSH 4.34 05/03/2016   Lab Results  Component Value Date   WBC 7.4 08/05/2016     HGB 12.4 (L) 08/05/2016   HCT 36.7 (L) 08/05/2016   MCV 83.2 08/05/2016   PLT 366 08/05/2016   Lab Results  Component Value Date   NA 133 (L) 08/05/2016   K 3.9 08/05/2016   CO2 27 08/05/2016   GLUCOSE 112 (H) 08/05/2016   BUN 14 08/05/2016   CREATININE 0.83 08/05/2016   BILITOT 0.8 08/05/2016   ALKPHOS 60 08/05/2016   AST 22 08/05/2016   ALT 14 (L) 08/05/2016   PROT 6.9 08/05/2016   ALBUMIN 3.4 (L) 08/05/2016   CALCIUM 8.9 08/05/2016   ANIONGAP 7 08/05/2016   GFR 72.83 05/03/2016   Lab Results  Component Value Date   CHOL 178 01/19/2016   Lab Results  Component Value Date   HDL 102 01/19/2016   Lab Results  Component Value Date   LDLCALC 60 01/19/2016   Lab Results  Component Value Date   TRIG 81 01/19/2016   Lab Results  Component Value Date   CHOLHDL 1.7 01/19/2016   Lab Results  Component Value Date   HGBA1C 5.2 05/03/2016         Assessment & Plan:   Problem List Items Addressed This Visit    Hyperlipidemia    Tolerating statin, encouraged heart healthy diet, avoid trans fats, minimize simple carbs and saturated fats. Increase exercise as tolerated      Essential hypertension    Well controlled, no changes to meds. Encouraged heart healthy diet such as the DASH diet and exercise as tolerated.       BPH (benign prostatic hyperplasia)    He currently has an indwelling catheter which he will keep until his prostate surgery in May. He is tolerating catheter.       Relevant Medications   Silodosin (RAPAFLO PO)   finasteride (PROSCAR) 5 MG tablet   Hyperglycemia    hgba1c acceptable, minimize simple carbs. Increase exercise as tolerated.          I have discontinued Mr. Morgan  ciprofloxacin. I am also having him maintain his multivitamin, multivitamin-lutein, Probiotic Product (PROBIOTIC DAILY PO), acetaminophen, apixaban, lovastatin, Vitamin D3, meclizine, CARTIA XT, Silodosin (RAPAFLO PO), and finasteride.  Meds ordered this encounter   Medications  . Silodosin (RAPAFLO PO)    Sig: Take by mouth.  . finasteride (PROSCAR) 5 MG tablet    Sig: Take 5 mg by mouth daily.    CMA served as Education administrator during this visit. History, Physical and Plan performed by medical provider. Documentation and orders reviewed and attested to.  Justin Homans, MD

## 2016-10-08 NOTE — Patient Instructions (Signed)
Hypertension °Hypertension is another name for high blood pressure. High blood pressure forces your heart to work harder to pump blood. This can cause problems over time. °There are two numbers in a blood pressure reading. There is a top number (systolic) over a bottom number (diastolic). It is best to have a blood pressure below 120/80. Healthy choices can help lower your blood pressure. You may need medicine to help lower your blood pressure if: °· Your blood pressure cannot be lowered with healthy choices. °· Your blood pressure is higher than 130/80. °Follow these instructions at home: °Eating and drinking  °· If directed, follow the DASH eating plan. This diet includes: °¨ Filling half of your plate at each meal with fruits and vegetables. °¨ Filling one quarter of your plate at each meal with whole grains. Whole grains include whole wheat pasta, brown Conkel, and whole grain bread. °¨ Eating or drinking low-fat dairy products, such as skim milk or low-fat yogurt. °¨ Filling one quarter of your plate at each meal with low-fat (lean) proteins. Low-fat proteins include fish, skinless chicken, eggs, beans, and tofu. °¨ Avoiding fatty meat, cured and processed meat, or chicken with skin. °¨ Avoiding premade or processed food. °· Eat less than 1,500 mg of salt (sodium) a day. °· Limit alcohol use to no more than 1 drink a day for nonpregnant women and 2 drinks a day for men. One drink equals 12 oz of beer, 5 oz of wine, or 1½ oz of hard liquor. °Lifestyle  °· Work with your doctor to stay at a healthy weight or to lose weight. Ask your doctor what the best weight is for you. °· Get at least 30 minutes of exercise that causes your heart to beat faster (aerobic exercise) most days of the week. This may include walking, swimming, or biking. °· Get at least 30 minutes of exercise that strengthens your muscles (resistance exercise) at least 3 days a week. This may include lifting weights or pilates. °· Do not use any  products that contain nicotine or tobacco. This includes cigarettes and e-cigarettes. If you need help quitting, ask your doctor. °· Check your blood pressure at home as told by your doctor. °· Keep all follow-up visits as told by your doctor. This is important. °Medicines  °· Take over-the-counter and prescription medicines only as told by your doctor. Follow directions carefully. °· Do not skip doses of blood pressure medicine. The medicine does not work as well if you skip doses. Skipping doses also puts you at risk for problems. °· Ask your doctor about side effects or reactions to medicines that you should watch for. °Contact a doctor if: °· You think you are having a reaction to the medicine you are taking. °· You have headaches that keep coming back (recurring). °· You feel dizzy. °· You have swelling in your ankles. °· You have trouble with your vision. °Get help right away if: °· You get a very bad headache. °· You start to feel confused. °· You feel weak or numb. °· You feel faint. °· You get very bad pain in your: °¨ Chest. °¨ Belly (abdomen). °· You throw up (vomit) more than once. °· You have trouble breathing. °Summary °· Hypertension is another name for high blood pressure. °· Making healthy choices can help lower blood pressure. If your blood pressure cannot be controlled with healthy choices, you may need to take medicine. °This information is not intended to replace advice given to you by your   health care provider. Make sure you discuss any questions you have with your health care provider. °Document Released: 12/01/2007 Document Revised: 05/12/2016 Document Reviewed: 05/12/2016 °Elsevier Interactive Patient Education © 2017 [employer] ° °

## 2016-10-08 NOTE — Telephone Encounter (Signed)
Caller name: Justin [name]  Relation to pt: self Call back number: Pharmacy:  Reason for call: Pt states that he forgot to tell the nurse in today 10-08-2016 visit with provider that he is taking Finasteride as one of his meds can you add it to his list, he saw it on his allergies list, before there was a question mark if he was allergic to it but he states he is not allergic to it and that he is taking it at the moment. To please update it on his chart.

## 2016-10-08 NOTE — Progress Notes (Signed)
Pre visit review using our clinic review tool, if applicable. No additional management support is needed unless otherwise documented below in the visit note. 

## 2016-10-08 NOTE — Telephone Encounter (Signed)
Upon review and per Dr. Frederik Pear instructions, Finasteride 5mg  po qd was re-added to the Patient's active medication list.

## 2016-10-10 NOTE — Assessment & Plan Note (Signed)
hgba1c acceptable, minimize simple carbs. Increase exercise as tolerated.  

## 2016-10-10 NOTE — Assessment & Plan Note (Signed)
He currently has an indwelling catheter which he will keep until his prostate surgery in May. He is tolerating catheter.

## 2016-10-10 NOTE — Assessment & Plan Note (Signed)
Well controlled, no changes to meds. Encouraged heart healthy diet such as the DASH diet and exercise as tolerated.  °

## 2016-10-10 NOTE — Assessment & Plan Note (Signed)
Tolerating statin, encouraged heart healthy diet, avoid trans fats, minimize simple carbs and saturated fats. Increase exercise as tolerated 

## 2016-10-13 DIAGNOSIS — R338 Other retention of urine: Secondary | ICD-10-CM | POA: Diagnosis not present

## 2016-10-29 NOTE — Patient Instructions (Addendum)
Justin [name]  10/29/2016   Your procedure is scheduled on: 11-04-16   Report to [facility] Main  Entrance Take [place]  elevators to 3rd floor to [place] at 1030 AM.    Call this number if you have problems the morning of surgery [phone]    Remember: ONLY 1 PERSON MAY GO WITH YOU TO SHORT STAY TO GET  READY MORNING OF [place].  You may should have a clear liquid diet along with your prep the day before the procedure. Do not eat food or drink liquids :After Midnight.     CLEAR LIQUID DIET   Foods Allowed                                                                     Foods Excluded  Coffee and tea, regular and decaf                             liquids that you cannot  Plain Jell-O in any flavor                                             see through such as: Fruit ices (not with fruit pulp)                                     milk, soups, orange juice  Iced Popsicles                                    All solid food Carbonated beverages, regular and diet                                    Cranberry, grape and apple juices Sports drinks like Gatorade Lightly seasoned clear broth or consume(fat free) Sugar, honey syrup  Sample Menu Breakfast                                Lunch                                     Supper Cranberry juice                    Beef broth                            Chicken broth Jell-O                                     Grape juice  Apple juice Coffee or tea                        Jell-O                                      Popsicle                                                Coffee or tea                        Coffee or tea  _____________________________________________________________________     Take these medicines the morning of surgery with A SIP OF WATER: None                               You may not have any metal on your body including hair pins and   piercings  Do not wear jewelry, make-up, lotions, powders or perfumes, deodorant             Men may shave face and neck.   Do not bring valuables to the hospital. [place].  Contacts, dentures or bridgework may not be worn into surgery.  Leave suitcase in the car. After surgery it may be brought to your room.                  Please read over the following fact sheets you were given: _____________________________________________________________________  [facility] - Preparing for Surgery Before surgery, you can play an important role.  Because skin is not sterile, your skin needs to be as free of germs as possible.  You can reduce the number of germs on your skin by washing with CHG (chlorahexidine gluconate) soap before surgery.  CHG is an antiseptic cleaner which kills germs and bonds with the skin to continue killing germs even after washing. Please DO NOT use if you have an allergy to CHG or antibacterial soaps.  If your skin becomes reddened/irritated stop using the CHG and inform your nurse when you arrive at Short Stay. Do not shave (including legs and underarms) for at least 48 hours prior to the first CHG shower.  You may shave your face/neck. Please follow these instructions carefully:  1.  Shower with CHG Soap the night before surgery and the  morning of Surgery.  2.  If you choose to wash your hair, wash your hair first as usual with your  normal  shampoo.  3.  After you shampoo, rinse your hair and body thoroughly to remove the  shampoo.                           4.  Use CHG as you would any other liquid soap.  You can apply chg directly  to the skin and wash                       Gently with a scrungie or clean washcloth.  5.  Apply the CHG Soap to your  body ONLY FROM THE NECK DOWN.   Do not use on face/ open                           Wound or open sores. Avoid contact with eyes, ears mouth and genitals (private parts).                        Wash face,  Genitals (private parts) with your normal soap.             6.  Wash thoroughly, paying special attention to the area where your surgery  will be performed.  7.  Thoroughly rinse your body with warm water from the neck down.  8.  DO NOT shower/wash with your normal soap after using and rinsing off  the CHG Soap.                9.  Pat yourself dry with a clean towel.            10.  Wear clean pajamas.            11.  Place clean sheets on your bed the night of your first shower and do not  sleep with pets. Day of Surgery : Do not apply any lotions/deodorants the morning of surgery.  Please wear clean clothes to the hospital/surgery center.  FAILURE TO FOLLOW THESE INSTRUCTIONS MAY RESULT IN THE CANCELLATION OF YOUR SURGERY PATIENT SIGNATURE_________________________________  NURSE SIGNATURE__________________________________  ________________________________________________________________________

## 2016-10-29 NOTE — Progress Notes (Signed)
10-01-16 (EPIC), Stress 10-06-16 (EPIC) Echo 08-05-16 (EPIC) EKG

## 2016-11-01 ENCOUNTER — Encounter (HOSPITAL_COMMUNITY): Payer: Self-pay

## 2016-11-01 ENCOUNTER — Encounter (HOSPITAL_COMMUNITY)
Admission: RE | Admit: 2016-11-01 | Discharge: 2016-11-01 | Disposition: A | Discharge status: Home / Self Care | Payer: PPO | Source: Ambulatory Visit | Attending: Urology | Admitting: Urology

## 2016-11-01 DIAGNOSIS — Z79899 Other long term (current) drug therapy: Secondary | ICD-10-CM | POA: Diagnosis not present

## 2016-11-01 DIAGNOSIS — Z87891 Personal history of nicotine dependence: Secondary | ICD-10-CM | POA: Diagnosis not present

## 2016-11-01 DIAGNOSIS — R410 Disorientation, unspecified: Secondary | ICD-10-CM | POA: Diagnosis not present

## 2016-11-01 DIAGNOSIS — Z96642 Presence of left artificial hip joint: Secondary | ICD-10-CM | POA: Diagnosis not present

## 2016-11-01 DIAGNOSIS — Z7901 Long term (current) use of anticoagulants: Secondary | ICD-10-CM | POA: Diagnosis not present

## 2016-11-01 DIAGNOSIS — E785 Hyperlipidemia, unspecified: Secondary | ICD-10-CM | POA: Diagnosis not present

## 2016-11-01 DIAGNOSIS — I10 Essential (primary) hypertension: Secondary | ICD-10-CM | POA: Diagnosis not present

## 2016-11-01 DIAGNOSIS — Z85828 Personal history of other malignant neoplasm of skin: Secondary | ICD-10-CM | POA: Diagnosis not present

## 2016-11-01 DIAGNOSIS — R338 Other retention of urine: Secondary | ICD-10-CM | POA: Diagnosis not present

## 2016-11-01 DIAGNOSIS — N401 Enlarged prostate with lower urinary tract symptoms: Secondary | ICD-10-CM | POA: Diagnosis not present

## 2016-11-01 DIAGNOSIS — I350 Nonrheumatic aortic (valve) stenosis: Secondary | ICD-10-CM | POA: Diagnosis not present

## 2016-11-01 DIAGNOSIS — N400 Enlarged prostate without lower urinary tract symptoms: Secondary | ICD-10-CM | POA: Diagnosis not present

## 2016-11-01 DIAGNOSIS — I2510 Atherosclerotic heart disease of native coronary artery without angina pectoris: Secondary | ICD-10-CM | POA: Diagnosis not present

## 2016-11-01 DIAGNOSIS — Z951 Presence of aortocoronary bypass graft: Secondary | ICD-10-CM | POA: Diagnosis not present

## 2016-11-01 DIAGNOSIS — T402X5A Adverse effect of other opioids, initial encounter: Secondary | ICD-10-CM | POA: Diagnosis not present

## 2016-11-01 DIAGNOSIS — I480 Paroxysmal atrial fibrillation: Secondary | ICD-10-CM | POA: Diagnosis not present

## 2016-11-01 DIAGNOSIS — I252 Old myocardial infarction: Secondary | ICD-10-CM | POA: Diagnosis not present

## 2016-11-01 HISTORY — DX: Other complications of anesthesia, initial encounter: T88.59XA

## 2016-11-01 HISTORY — DX: Adverse effect of unspecified anesthetic, initial encounter: T41.45XA

## 2016-11-01 LAB — BASIC METABOLIC PANEL
ANION GAP: 7 (ref 5–15)
BUN: 13 mg/dL (ref 6–20)
CALCIUM: 9.2 mg/dL (ref 8.9–10.3)
CO2: 27 mmol/L (ref 22–32)
Chloride: 102 mmol/L (ref 101–111)
Creatinine, Ser: 0.93 mg/dL (ref 0.61–1.24)
GFR calc Af Amer: >60 mL/min (ref >60)
Glucose, Bld: 107 mg/dL — ABNORMAL HIGH (ref 65–99)
Potassium: 4.9 mmol/L (ref 3.5–5.1)
Sodium: 136 mmol/L (ref 135–145)

## 2016-11-01 LAB — CBC
HCT: 40.1 % (ref 39.0–52.0)
HEMOGLOBIN: 13.1 g/dL (ref 13.0–17.0)
MCH: 27.1 pg (ref 26.0–34.0)
MCHC: 32.7 g/dL (ref 30.0–36.0)
MCV: 82.9 fL (ref 78.0–100.0)
Platelets: 353 10*3/uL (ref 150–400)
RBC: 4.84 MIL/uL (ref 4.22–5.81)
RDW: 15.2 % (ref 11.5–15.5)
WBC: 6.4 10*3/uL (ref 4.0–10.5)

## 2016-11-01 LAB — ABO/RH: ABO/RH(D): O NEG

## 2016-11-01 NOTE — Progress Notes (Signed)
11-01-16 PAT visit: Pt indicated that he was told to stop Eliquis, two days prior to his procedure. He also had bowel prep instructions with him that we discussed.  Pt's wife indicated that she was concerned about his HR being slightly elevated in the last couple of days (99 to 100). Manual HR was 68, (Hx of A-fib). Pt wife stated that she will contact the MD's office to discuss elevated HR.

## 2016-11-02 NOTE — Progress Notes (Signed)
HPI: FU CAD s/p CABG (2001) and atrial fibrillation. Monitor March 2016 showed sinus rhythm with PAF. ABIs 7/17 showed many vessels noncompressible. Echo 4/18 showed EF 40-45, mild to moderate AS, moderate MR and severe LAE. Nuclear study 4/18 showed small area of ischemia in the mid anterior wall; treated medically. Since last seen, patient underwent surgery on his prostate recently. He apparently had difficulties with atrial fibrillation. At present he denies dyspnea, chest pain, palpitations, syncope or pedal edema. Mild hematuria.  Current Outpatient Prescriptions  Medication Sig Dispense Refill  . acetaminophen (TYLENOL) 325 MG tablet Take 1-2 tablets (325-650 mg total) by mouth every 6 (six) hours as needed for mild pain or moderate pain. (Patient taking differently: Take 325 mg by mouth every 6 (six) hours as needed for mild pain or moderate pain. ) 40 tablet 0  . Carboxymethylcellulose Sodium (LUBRICANT EYE DROPS OP) Apply 1 drop to eye 2 (two) times daily as needed (dry eyes).    [name] diltiazem (CARTIA XT) 120 MG 24 hr capsule Take 2 capsules (240 mg total) by mouth daily. 90 capsule 3  . finasteride (PROSCAR) 5 MG tablet Take 5 mg by mouth every evening.     . lovastatin (MEVACOR) 20 MG tablet TAKE ONE TABLET BY MOUTH ONCE DAILY AT BEDTIME 90 tablet 3  . meclizine (ANTIVERT) 25 MG tablet Take 1 tablet (25 mg total) by mouth 3 (three) times daily as needed for dizziness. 30 tablet 0  . Probiotic Product (PROBIOTIC DAILY PO) Take 1 capsule by mouth daily.    [name] sulfamethoxazole-trimethoprim (BACTRIM DS,SEPTRA DS) 800-160 MG tablet Take 1 tablet by mouth 2 (two) times daily. Start the day prior to foley removal appointment 6 tablet 0  . Sunscreens (AVEENO ESSENTIAL MOISTURE EX) Apply 1 application topically daily.    . traMADol (ULTRAM) 50 MG tablet Take 1 tablet (50 mg total) by mouth every 6 (six) hours as needed for severe pain. 20 tablet 0   No current facility-administered  medications for this visit.      Past Medical History:  Diagnosis Date  . Basal cell carcinoma   . BPH (benign prostatic hypertrophy)   . CAD (coronary artery disease) 1991   a. H/o MI w/ PTCA;  b. s/p CABG by Dr Roxan Hockey w/ LIMA-LAD, SVG-OM1-OM3, SVG-D1, SVG-PDA  . Complication of anesthesia    Wife reports history of confusion after last surgery in 3-17  . Disequilibrium 04/03/2012  . HTN (hypertension)   . Hyperlipidemia   . Internal hemorrhoids   . Medicare annual wellness visit, subsequent 03/16/2015   Follows with cardiology, Dr Stanford Breed, Follows with dermatology, Dr Altamese Cabal. Aged out of colonoscopy  . Moderate aortic stenosis    a. 08/2014 Echo: EF 55-60%, no rwma, Gr 1 DD, mod AS, mildly dil asc Ao, mild MR, sev dil LA.  [name] PAF (paroxysmal atrial fibrillation) ([place])    a. Dx 08/2014;  b. CHA2DS2VASc = 4 - decision made to withold [place] 2/2 unsteady gait and syncope.  . Sigmoid diverticulitis   . Syncope    a. 08/2014 - ? orthostatic    Past Surgical History:  Procedure Laterality Date  . COLONOSCOPY    . CORONARY ARTERY BYPASS GRAFT  2000   Dr Roxan Hockey  LIMA-LAD, SVG-OM1-OM3, SVG-D1, SVG-PDA.   [name] HIP ARTHROPLASTY Left 10/14/2015   Procedure: LEFT HIP HEMI ARTHROPLASTY;  Surgeon: [name], MD;  Location: [place];  Service: Orthopedics;  Laterality: Left;  . SHOULDER SURGERY  x2  . TONSILLECTOMY    . XI ROBOTIC ASSISTED SIMPLE PROSTATECTOMY N/A 11/04/2016   Procedure: XI ROBOTIC ASSISTED SIMPLE PROSTATECTOMY;  Surgeon: Cleon Gustin, MD;  Location: [facility];  Service: Urology;  Laterality: N/A;    Social History   Social History  . Marital status: Married    Spouse name: N/A  . Number of children: 2  . Years of education: N/A   Occupational History  . 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 Smoking status: Former Smoker    Packs/day: 0.50    Years: 15.00    Quit date: 06/29/1971  . Smokeless tobacco: Never Used  .  Alcohol use 0.6 - 1.2 oz/week    1 - 2 Glasses of wine per week     Comment: alternates wine and liquor. 1/2 pint liquor/week  . Drug use: No  . Sexual activity: No     Comment: lives with wife, retired English as a second language teacher, no dietary restrictions   Other Topics Concern  . Not on file   Social History Narrative  . No narrative on file    Family History  Problem Relation Age of Onset  . Coronary artery disease Father   . Hyperlipidemia Father   . Heart attack Father   . Alcohol abuse Father   . Bipolar disorder Mother   . Heart disease Mother   . Alcohol abuse Brother   . Cancer Brother   . Cancer Maternal Grandfather        oral cancer, chew tobacco  . Cancer Sister   . Glaucoma Sister   . Alcohol abuse Unknown     ROS: no fevers or chills, productive cough, hemoptysis, dysphasia, odynophagia, melena, hematochezia, dysuria, hematuria, rash, seizure activity, orthopnea, PND, pedal edema, claudication. Remaining systems are negative.  Physical Exam: Well-developed well-nourished, elderly in no acute distress.  Skin is warm and dry.  HEENT is normal.  Neck is supple.  Chest is clear to auscultation with normal expansion.  Cardiovascular exam is irregular and tachycardic Abdominal exam nontender or distended. No masses palpated. Extremities show no edema. neuro grossly intact  ECG- Atrial fibrillation with rapid ventricular response at 114. Occasional PVCs or aberrantly conducted beat. Left axis deviation. Nonspecific ST changes. personally reviewed  A/P  1 Paroxysmal atrial fibrillation-patient is in atrial fibrillation today. Continue Cardizem for rate control. I am hesitant to advance AV nodal blocking agents as his blood pressure is borderline and he has had difficulties with orthostatic hypotension previously. Add amiodarone 200 mg twice a day for 2 weeks and then 200 mg daily thereafter. I have asked him to resume apixaban when okay with urology from a  surgical standpoint.  2 coronary artery disease-continue statin. No aspirin given need for anticoagulation. Recent nuclear study low risk. We'll plan medical therapy.   3 hypertension-blood pressure controlled. Continue present medications. I would like to avoid advancing his blood pressure medications as he has a history of orthostasis.  4 hyperlipidemia-continue statin.  5 mild to moderate aortic stenosis-he will need follow-up studies in the future.  6 Peripheral vascular disease-continue statin.   Kirk Ruths, MD

## 2016-11-04 ENCOUNTER — Inpatient Hospital Stay (HOSPITAL_COMMUNITY): Payer: PPO | Admitting: Anesthesiology

## 2016-11-04 ENCOUNTER — Inpatient Hospital Stay (HOSPITAL_COMMUNITY)
Admission: RE | Admit: 2016-11-04 | Discharge: 2016-11-08 | DRG: 718 | Disposition: A | Discharge status: Skilled Nursing Facility | Payer: PPO | Source: Ambulatory Visit | Attending: Urology | Admitting: Urology

## 2016-11-04 ENCOUNTER — Encounter (HOSPITAL_COMMUNITY)
Admission: RE | Disposition: A | Discharge status: Skilled Nursing Facility | Payer: Self-pay | Source: Ambulatory Visit | Attending: Urology

## 2016-11-04 ENCOUNTER — Encounter (HOSPITAL_COMMUNITY): Payer: Self-pay | Admitting: *Deleted

## 2016-11-04 DIAGNOSIS — Z9079 Acquired absence of other genital organ(s): Secondary | ICD-10-CM

## 2016-11-04 DIAGNOSIS — Z79899 Other long term (current) drug therapy: Secondary | ICD-10-CM | POA: Diagnosis not present

## 2016-11-04 DIAGNOSIS — Z96642 Presence of left artificial hip joint: Secondary | ICD-10-CM | POA: Diagnosis present

## 2016-11-04 DIAGNOSIS — M6281 Muscle weakness (generalized): Secondary | ICD-10-CM | POA: Diagnosis not present

## 2016-11-04 DIAGNOSIS — Z951 Presence of aortocoronary bypass graft: Secondary | ICD-10-CM

## 2016-11-04 DIAGNOSIS — I480 Paroxysmal atrial fibrillation: Secondary | ICD-10-CM | POA: Diagnosis present

## 2016-11-04 DIAGNOSIS — Z87891 Personal history of nicotine dependence: Secondary | ICD-10-CM

## 2016-11-04 DIAGNOSIS — I252 Old myocardial infarction: Secondary | ICD-10-CM

## 2016-11-04 DIAGNOSIS — I10 Essential (primary) hypertension: Secondary | ICD-10-CM | POA: Diagnosis present

## 2016-11-04 DIAGNOSIS — T402X5A Adverse effect of other opioids, initial encounter: Secondary | ICD-10-CM | POA: Diagnosis not present

## 2016-11-04 DIAGNOSIS — R410 Disorientation, unspecified: Secondary | ICD-10-CM | POA: Diagnosis not present

## 2016-11-04 DIAGNOSIS — Z85828 Personal history of other malignant neoplasm of skin: Secondary | ICD-10-CM

## 2016-11-04 DIAGNOSIS — Z7901 Long term (current) use of anticoagulants: Secondary | ICD-10-CM | POA: Diagnosis not present

## 2016-11-04 DIAGNOSIS — R2681 Unsteadiness on feet: Secondary | ICD-10-CM | POA: Diagnosis not present

## 2016-11-04 DIAGNOSIS — E785 Hyperlipidemia, unspecified: Secondary | ICD-10-CM | POA: Diagnosis not present

## 2016-11-04 DIAGNOSIS — I350 Nonrheumatic aortic (valve) stenosis: Secondary | ICD-10-CM | POA: Diagnosis not present

## 2016-11-04 DIAGNOSIS — N400 Enlarged prostate without lower urinary tract symptoms: Secondary | ICD-10-CM | POA: Diagnosis not present

## 2016-11-04 DIAGNOSIS — R339 Retention of urine, unspecified: Secondary | ICD-10-CM | POA: Diagnosis not present

## 2016-11-04 DIAGNOSIS — R1013 Epigastric pain: Secondary | ICD-10-CM | POA: Diagnosis not present

## 2016-11-04 DIAGNOSIS — R338 Other retention of urine: Secondary | ICD-10-CM | POA: Diagnosis not present

## 2016-11-04 DIAGNOSIS — N401 Enlarged prostate with lower urinary tract symptoms: Secondary | ICD-10-CM | POA: Diagnosis not present

## 2016-11-04 DIAGNOSIS — N138 Other obstructive and reflux uropathy: Secondary | ICD-10-CM | POA: Diagnosis present

## 2016-11-04 DIAGNOSIS — I2510 Atherosclerotic heart disease of native coronary artery without angina pectoris: Secondary | ICD-10-CM | POA: Diagnosis present

## 2016-11-04 DIAGNOSIS — Z48816 Encounter for surgical aftercare following surgery on the genitourinary system: Secondary | ICD-10-CM | POA: Diagnosis not present

## 2016-11-04 HISTORY — PX: XI ROBOTIC ASSISTED SIMPLE PROSTATECTOMY: SHX6713

## 2016-11-04 LAB — TYPE AND SCREEN
ABO/RH(D): O NEG
ANTIBODY SCREEN: NEGATIVE

## 2016-11-04 LAB — HEMOGLOBIN AND HEMATOCRIT, BLOOD
HCT: 41 % (ref 39.0–52.0)
HEMOGLOBIN: 13.5 g/dL (ref 13.0–17.0)

## 2016-11-04 SURGERY — PROSTATECTOMY, SIMPLE, ROBOT-ASSISTED
Anesthesia: General | Site: Abdomen

## 2016-11-04 MED ORDER — DEXTROSE 5 % IV SOLN
2.0000 g | INTRAVENOUS | Status: AC
  Administered 2016-11-04: 2 g via INTRAVENOUS
  Filled 2016-11-04: qty 2

## 2016-11-04 MED ORDER — DILTIAZEM HCL 100 MG IV SOLR: 5.0000 mg | Freq: Once | INTRAVENOUS | Status: DC

## 2016-11-04 MED ORDER — PROPOFOL 10 MG/ML IV BOLUS
INTRAVENOUS | Status: AC
  Filled 2016-11-04: qty 20

## 2016-11-04 MED ORDER — ROCURONIUM BROMIDE 10 MG/ML (PF) SYRINGE
PREFILLED_SYRINGE | INTRAVENOUS | Status: DC | PRN
  Administered 2016-11-04 (×2): 10 mg via INTRAVENOUS
  Administered 2016-11-04: 50 mg via INTRAVENOUS
  Administered 2016-11-04: 10 mg via INTRAVENOUS

## 2016-11-04 MED ORDER — ROCURONIUM BROMIDE 50 MG/5ML IV SOSY
PREFILLED_SYRINGE | INTRAVENOUS | Status: AC
  Filled 2016-11-04: qty 5

## 2016-11-04 MED ORDER — ONDANSETRON HCL 4 MG/2ML IJ SOLN: 4.0000 mg | INTRAMUSCULAR | Status: DC | PRN

## 2016-11-04 MED ORDER — MECLIZINE HCL 25 MG PO TABS: 25.0000 mg | ORAL_TABLET | Freq: Three times a day (TID) | ORAL | Status: DC | PRN

## 2016-11-04 MED ORDER — LIDOCAINE 2% (20 MG/ML) 5 ML SYRINGE
INTRAMUSCULAR | Status: DC | PRN
  Administered 2016-11-04: 50 mg via INTRAVENOUS

## 2016-11-04 MED ORDER — TAMSULOSIN HCL 0.4 MG PO CAPS
0.4000 mg | ORAL_CAPSULE | Freq: Every day | ORAL | Status: DC
  Administered 2016-11-04 – 2016-11-05 (×2): 0.4 mg via ORAL
  Filled 2016-11-04 (×2): qty 1

## 2016-11-04 MED ORDER — POLYVINYL ALCOHOL 1.4 % OP SOLN
1.0000 [drp] | OPHTHALMIC | Status: DC | PRN
  Administered 2016-11-04: 1 [drp] via OPHTHALMIC
  Filled 2016-11-04: qty 15

## 2016-11-04 MED ORDER — DILTIAZEM HCL 100 MG IV SOLR
5.0000 mg/h | INTRAVENOUS | Status: AC
  Filled 2016-11-04: qty 100

## 2016-11-04 MED ORDER — HYDROCODONE-ACETAMINOPHEN 5-325 MG PO TABS
1.0000 | ORAL_TABLET | ORAL | Status: DC | PRN
  Administered 2016-11-04: 2 via ORAL
  Administered 2016-11-06: 1 via ORAL
  Filled 2016-11-04: qty 1
  Filled 2016-11-04: qty 2

## 2016-11-04 MED ORDER — SUGAMMADEX SODIUM 200 MG/2ML IV SOLN
INTRAVENOUS | Status: AC
  Filled 2016-11-04: qty 2

## 2016-11-04 MED ORDER — ROCURONIUM BROMIDE 50 MG/5ML IV SOSY
PREFILLED_SYRINGE | INTRAVENOUS | Status: AC
  Filled 2016-11-04: qty 15

## 2016-11-04 MED ORDER — ONDANSETRON HCL 4 MG/2ML IJ SOLN
INTRAMUSCULAR | Status: DC | PRN
  Administered 2016-11-04: 4 mg via INTRAVENOUS

## 2016-11-04 MED ORDER — SUCCINYLCHOLINE CHLORIDE 200 MG/10ML IV SOSY
PREFILLED_SYRINGE | INTRAVENOUS | Status: DC | PRN
  Administered 2016-11-04: 120 mg via INTRAVENOUS

## 2016-11-04 MED ORDER — LACTATED RINGERS IV SOLN
INTRAVENOUS | Status: DC | PRN
  Administered 2016-11-04: 13:00:00 via INTRAVENOUS

## 2016-11-04 MED ORDER — DIPHENHYDRAMINE HCL 12.5 MG/5ML PO ELIX: 12.5000 mg | ORAL_SOLUTION | Freq: Four times a day (QID) | ORAL | Status: DC | PRN

## 2016-11-04 MED ORDER — METOPROLOL TARTRATE 5 MG/5ML IV SOLN
2.0000 mg | Freq: Once | INTRAVENOUS | Status: AC
  Administered 2016-11-04: 3 mg via INTRAVENOUS

## 2016-11-04 MED ORDER — OXYCODONE HCL 5 MG PO TABS: 5.0000 mg | ORAL_TABLET | Freq: Once | ORAL | Status: DC | PRN

## 2016-11-04 MED ORDER — PROPOFOL 10 MG/ML IV BOLUS
INTRAVENOUS | Status: DC | PRN
  Administered 2016-11-04: 120 mg via INTRAVENOUS

## 2016-11-04 MED ORDER — DILTIAZEM HCL 100 MG IV SOLR
5.0000 mg/h | INTRAVENOUS | Status: DC
  Administered 2016-11-04: 5 mg/h via INTRAVENOUS
  Filled 2016-11-04: qty 100

## 2016-11-04 MED ORDER — FENTANYL CITRATE (PF) 100 MCG/2ML IJ SOLN
INTRAMUSCULAR | Status: AC
  Filled 2016-11-04: qty 2

## 2016-11-04 MED ORDER — SODIUM CHLORIDE 0.9 % IJ SOLN
INTRAMUSCULAR | Status: DC | PRN
  Administered 2016-11-04: 20 mL

## 2016-11-04 MED ORDER — ONDANSETRON HCL 4 MG/2ML IJ SOLN: 4.0000 mg | Freq: Once | INTRAMUSCULAR | Status: DC | PRN

## 2016-11-04 MED ORDER — PHENYLEPHRINE 40 MCG/ML (10ML) SYRINGE FOR IV PUSH (FOR BLOOD PRESSURE SUPPORT)
PREFILLED_SYRINGE | INTRAVENOUS | Status: DC | PRN
  Administered 2016-11-04 (×5): 80 ug via INTRAVENOUS

## 2016-11-04 MED ORDER — FENTANYL CITRATE (PF) 250 MCG/5ML IJ SOLN
INTRAMUSCULAR | Status: AC
  Filled 2016-11-04: qty 5

## 2016-11-04 MED ORDER — HYDROCODONE-ACETAMINOPHEN 5-325 MG PO TABS: 1.0000 | ORAL_TABLET | Freq: Four times a day (QID) | ORAL | 0 refills | Status: DC | PRN

## 2016-11-04 MED ORDER — SUCCINYLCHOLINE CHLORIDE 200 MG/10ML IV SOSY
PREFILLED_SYRINGE | INTRAVENOUS | Status: AC
  Filled 2016-11-04: qty 10

## 2016-11-04 MED ORDER — LACTATED RINGERS IV SOLN
INTRAVENOUS | Status: DC
  Administered 2016-11-04 (×2): via INTRAVENOUS

## 2016-11-04 MED ORDER — MORPHINE SULFATE (PF) 10 MG/ML IV SOLN: 2.0000 mg | INTRAVENOUS | Status: DC | PRN

## 2016-11-04 MED ORDER — OXYCODONE HCL 5 MG/5ML PO SOLN
5.0000 mg | Freq: Once | ORAL | Status: DC | PRN
  Filled 2016-11-04: qty 5

## 2016-11-04 MED ORDER — DEXAMETHASONE SODIUM PHOSPHATE 10 MG/ML IJ SOLN
INTRAMUSCULAR | Status: AC
  Filled 2016-11-04: qty 1

## 2016-11-04 MED ORDER — FENTANYL CITRATE (PF) 100 MCG/2ML IJ SOLN: 25.0000 ug | INTRAMUSCULAR | Status: DC | PRN

## 2016-11-04 MED ORDER — SODIUM CHLORIDE 0.9 % IJ SOLN
INTRAMUSCULAR | Status: AC
  Filled 2016-11-04: qty 10

## 2016-11-04 MED ORDER — FINASTERIDE 5 MG PO TABS
5.0000 mg | ORAL_TABLET | Freq: Every evening | ORAL | Status: DC
  Administered 2016-11-04 – 2016-11-07 (×3): 5 mg via ORAL
  Filled 2016-11-04 (×4): qty 1

## 2016-11-04 MED ORDER — LACTATED RINGERS IV SOLN: INTRAVENOUS | Status: DC

## 2016-11-04 MED ORDER — PRAVASTATIN SODIUM 20 MG PO TABS
20.0000 mg | ORAL_TABLET | Freq: Every day | ORAL | Status: DC
  Administered 2016-11-05 – 2016-11-07 (×3): 20 mg via ORAL
  Filled 2016-11-04 (×3): qty 1

## 2016-11-04 MED ORDER — METOPROLOL TARTRATE 5 MG/5ML IV SOLN
INTRAVENOUS | Status: AC
  Filled 2016-11-04: qty 5

## 2016-11-04 MED ORDER — DILTIAZEM HCL ER COATED BEADS 120 MG PO CP24
120.0000 mg | ORAL_CAPSULE | Freq: Every day | ORAL | Status: DC
  Administered 2016-11-04 – 2016-11-06 (×3): 120 mg via ORAL
  Filled 2016-11-04 (×3): qty 1

## 2016-11-04 MED ORDER — METOPROLOL TARTRATE 5 MG/5ML IV SOLN
INTRAVENOUS | Status: DC | PRN
  Administered 2016-11-04: 1 mg via INTRAVENOUS
  Administered 2016-11-04: .5 mg via INTRAVENOUS
  Administered 2016-11-04: 1 mg via INTRAVENOUS
  Administered 2016-11-04: 2.5 mg via INTRAVENOUS

## 2016-11-04 MED ORDER — PHENYLEPHRINE 40 MCG/ML (10ML) SYRINGE FOR IV PUSH (FOR BLOOD PRESSURE SUPPORT)
PREFILLED_SYRINGE | INTRAVENOUS | Status: AC
  Filled 2016-11-04: qty 10

## 2016-11-04 MED ORDER — FENTANYL CITRATE (PF) 100 MCG/2ML IJ SOLN
INTRAMUSCULAR | Status: DC | PRN
  Administered 2016-11-04 (×2): 50 ug via INTRAVENOUS
  Administered 2016-11-04: 100 ug via INTRAVENOUS
  Administered 2016-11-04: 50 ug via INTRAVENOUS

## 2016-11-04 MED ORDER — DIPHENHYDRAMINE HCL 50 MG/ML IJ SOLN
12.5000 mg | Freq: Four times a day (QID) | INTRAMUSCULAR | Status: DC | PRN
  Administered 2016-11-06: 12.5 mg via INTRAVENOUS
  Filled 2016-11-04: qty 1

## 2016-11-04 MED ORDER — FENTANYL CITRATE (PF) 100 MCG/2ML IJ SOLN
25.0000 ug | INTRAMUSCULAR | Status: DC | PRN
  Administered 2016-11-04 (×2): 25 ug via INTRAVENOUS

## 2016-11-04 MED ORDER — SULFAMETHOXAZOLE-TRIMETHOPRIM 800-160 MG PO TABS: 1.0000 | ORAL_TABLET | Freq: Two times a day (BID) | ORAL | 0 refills | Status: DC

## 2016-11-04 MED ORDER — DEXTROSE-NACL 5-0.45 % IV SOLN
INTRAVENOUS | Status: DC
  Administered 2016-11-04 – 2016-11-05 (×3): via INTRAVENOUS

## 2016-11-04 MED ORDER — SUGAMMADEX SODIUM 200 MG/2ML IV SOLN
INTRAVENOUS | Status: DC | PRN
  Administered 2016-11-04: 130 mg via INTRAVENOUS

## 2016-11-04 MED ORDER — ACETAMINOPHEN 325 MG PO TABS
650.0000 mg | ORAL_TABLET | ORAL | Status: DC | PRN
  Administered 2016-11-05: 650 mg via ORAL
  Filled 2016-11-04: qty 2

## 2016-11-04 MED ORDER — DILTIAZEM LOAD VIA INFUSION
5.0000 mg | Freq: Once | INTRAVENOUS | Status: AC
Start: 2016-11-04 — End: 2016-11-04
  Administered 2016-11-04: 5 mg via INTRAVENOUS
  Filled 2016-11-04: qty 5

## 2016-11-04 MED ORDER — LACTATED RINGERS IR SOLN
Status: DC | PRN
  Administered 2016-11-04: 1000 mL

## 2016-11-04 MED ORDER — BUPIVACAINE LIPOSOME 1.3 % IJ SUSP
20.0000 mL | Freq: Once | INTRAMUSCULAR | Status: AC
  Administered 2016-11-04: 20 mL
  Filled 2016-11-04: qty 20

## 2016-11-04 MED ORDER — LIDOCAINE 2% (20 MG/ML) 5 ML SYRINGE
INTRAMUSCULAR | Status: AC
  Filled 2016-11-04: qty 5

## 2016-11-04 SURGICAL SUPPLY — 60 items
ADH SKN CLS APL DERMABOND .7 (GAUZE/BANDAGES/DRESSINGS) ×1
APPLICATOR COTTON TIP 6IN STRL (MISCELLANEOUS) ×3 IMPLANT
BAG LAPAROSCOPIC 12 15 PORT 16 (BASKET) ×1 IMPLANT
BAG RETRIEVAL 12/15 (BASKET) ×2
BAG RETRIEVAL 12/15MM (BASKET) ×1
CATH FOLEY 3WAY 30CC 22FR (CATHETERS) ×3 IMPLANT
CATH TIEMANN FOLEY 18FR 5CC (CATHETERS) ×3 IMPLANT
CHLORAPREP W/TINT 26ML (MISCELLANEOUS) ×3 IMPLANT
CLOTH BEACON ORANGE TIMEOUT ST (SAFETY) ×3 IMPLANT
COVER SURGICAL LIGHT HANDLE (MISCELLANEOUS) ×3 IMPLANT
COVER TIP SHEARS 8 DVNC (MISCELLANEOUS) ×1 IMPLANT
COVER TIP SHEARS 8MM DA VINCI (MISCELLANEOUS) ×2
CUTTER ECHEON FLEX ENDO 45 340 (ENDOMECHANICALS) IMPLANT
DECANTER SPIKE VIAL GLASS SM (MISCELLANEOUS) ×3 IMPLANT
DERMABOND ADVANCED (GAUZE/BANDAGES/DRESSINGS) ×2
DERMABOND ADVANCED .7 DNX12 (GAUZE/BANDAGES/DRESSINGS) ×1 IMPLANT
DRAPE ARM DVNC X/XI (DISPOSABLE) ×4 IMPLANT
DRAPE COLUMN DVNC XI (DISPOSABLE) ×1 IMPLANT
DRAPE DA VINCI XI ARM (DISPOSABLE) ×8
DRAPE DA VINCI XI COLUMN (DISPOSABLE) ×2
DRSG TEGADERM 4X4.75 (GAUZE/BANDAGES/DRESSINGS) ×3 IMPLANT
ELECT REM PT RETURN 15FT ADLT (MISCELLANEOUS) ×3 IMPLANT
GAUZE SPONGE 2X2 8PLY STRL LF (GAUZE/BANDAGES/DRESSINGS) ×1 IMPLANT
GLOVE BIO SURGEON STRL SZ 6.5 (GLOVE) ×2 IMPLANT
GLOVE BIO SURGEON STRL SZ8 (GLOVE) ×3 IMPLANT
GLOVE BIO SURGEONS STRL SZ 6.5 (GLOVE) ×1
GLOVE BIOGEL PI IND STRL 8 (GLOVE) ×1 IMPLANT
GLOVE BIOGEL PI INDICATOR 8 (GLOVE) ×2
GOWN STRL REUS W/TWL LRG LVL3 (GOWN DISPOSABLE) ×6 IMPLANT
GOWN STRL REUS W/TWL LRG LVL4 (GOWN DISPOSABLE) ×9 IMPLANT
HOLDER FOLEY CATH W/STRAP (MISCELLANEOUS) ×3 IMPLANT
IRRIG SUCT STRYKERFLOW 2 WTIP (MISCELLANEOUS) ×3
IRRIGATION SUCT STRKRFLW 2 WTP (MISCELLANEOUS) ×1 IMPLANT
IV LACTATED RINGERS 1000ML (IV SOLUTION) ×3 IMPLANT
NEEDLE INSUFFLATION 14GA 120MM (NEEDLE) ×3 IMPLANT
PACK ROBOT UROLOGY CUSTOM (CUSTOM PROCEDURE TRAY) ×3 IMPLANT
PAD POSITIONING PINK XL (MISCELLANEOUS) ×3 IMPLANT
SEAL CANN UNIV 5-8 DVNC XI (MISCELLANEOUS) ×4 IMPLANT
SEAL XI 5MM-8MM UNIVERSAL (MISCELLANEOUS) ×8
SET IRRIG Y TYPE TUR BLADDER L (SET/KITS/TRAYS/PACK) ×3 IMPLANT
SHEET LAVH (DRAPES) IMPLANT
SOLUTION ELECTROLUBE (MISCELLANEOUS) ×3 IMPLANT
SPONGE GAUZE 2X2 STER 10/PKG (GAUZE/BANDAGES/DRESSINGS) ×2
SPONGE LAP 4X18 X RAY DECT (DISPOSABLE) ×3 IMPLANT
STAPLE RELOAD 45 GRN (STAPLE) IMPLANT
STAPLE RELOAD 45MM GREEN (STAPLE)
SUT ETHILON 3 0 PS 1 (SUTURE) ×3 IMPLANT
SUT MNCRL AB 4-0 PS2 18 (SUTURE) ×3 IMPLANT
SUT PDS AB 1 CT1 27 (SUTURE) ×3 IMPLANT
SUT V-LOC BARB 180 2/0GR6 GS22 (SUTURE) ×9
SUT VIC AB 0 CT1 27 (SUTURE) ×12
SUT VIC AB 0 CT1 27XBRD ANTBC (SUTURE) ×4 IMPLANT
SUT VIC AB 0 CT1 36 (SUTURE) ×6 IMPLANT
SUT VIC AB 2-0 SH 27 (SUTURE) ×3
SUT VIC AB 2-0 SH 27X BRD (SUTURE) ×1 IMPLANT
SUT VICRYL 0 UR6 27IN ABS (SUTURE) ×3 IMPLANT
SUTURE V-LC BRB 180 2/0GR6GS22 (SUTURE) ×3 IMPLANT
TOWEL OR 17X26 10 PK STRL BLUE (TOWEL DISPOSABLE) ×3 IMPLANT
TOWEL OR NON WOVEN STRL DISP B (DISPOSABLE) ×3 IMPLANT
WATER STERILE IRR 1500ML POUR (IV SOLUTION) ×6 IMPLANT

## 2016-11-04 NOTE — Anesthesia Preprocedure Evaluation (Addendum)
Anesthesia Evaluation  Patient identified by MRN, date of birth, ID band Patient awake    Reviewed: Allergy & Precautions, NPO status , Patient's Chart, lab work & pertinent test results  Airway Mallampati: II  TM Distance: >3 FB Neck ROM: Full    Dental  (+) Teeth Intact, Dental Advisory Given   Pulmonary former smoker,    breath sounds clear to auscultation       Cardiovascular hypertension,  Rhythm:Irregular Rate:Normal     Neuro/Psych    GI/Hepatic   Endo/Other    Renal/GU      Musculoskeletal   Abdominal   Peds  Hematology   Anesthesia Other Findings   Reproductive/Obstetrics                             Anesthesia Physical Anesthesia Plan  ASA: III  Anesthesia Plan: General   Post-op Pain Management:    Induction: Intravenous  Airway Management Planned: Oral ETT  Additional Equipment:   Intra-op Plan:   Post-operative Plan: Extubation in OR  Informed Consent: I have reviewed the patients History and Physical, chart, labs and discussed the procedure including the risks, benefits and alternatives for the proposed anesthesia with the patient or authorized representative who has indicated his/her understanding and acceptance.   Dental advisory given  Plan Discussed with: CRNA and Anesthesiologist  Anesthesia Plan Comments:         Anesthesia Quick Evaluation  

## 2016-11-04 NOTE — H&P (Signed)
Urology Admission H&P  Chief Complaint: urinary retention  History of Present Illness: Justin [name] is a 83yo with a hx of BPH with LUTS and urinary retention managed with a chronic foley. Prostate volume is 200g.   Past Medical History:  Diagnosis Date  . Basal cell carcinoma   . BPH (benign prostatic hypertrophy)   . CAD (coronary artery disease) 1991   a. H/o MI w/ PTCA;  b. s/p CABG by Dr Roxan Hockey w/ LIMA-LAD, SVG-OM1-OM3, SVG-D1, SVG-PDA  . Complication of anesthesia    Wife reports history of confusion after last surgery in 3-17  . Disequilibrium 04/03/2012  . HTN (hypertension)   . Hyperlipidemia   . Internal hemorrhoids   . Medicare annual wellness visit, subsequent 03/16/2015   Follows with cardiology, Dr Stanford Breed, Follows with dermatology, Dr Altamese Cabal. Aged out of colonoscopy  . Moderate aortic stenosis    a. 08/2014 Echo: EF 55-60%, no rwma, Gr 1 DD, mod AS, mildly dil asc Ao, mild Justin, sev dil LA.  [name] PAF (paroxysmal atrial fibrillation) ([place])    a. Dx 08/2014;  b. CHA2DS2VASc = 4 - decision made to withold [place] 2/2 unsteady gait and syncope.  . Sigmoid diverticulitis   . Syncope    a. 08/2014 - ? orthostatic   Past Surgical History:  Procedure Laterality Date  . COLONOSCOPY    . CORONARY ARTERY BYPASS GRAFT  2000   Dr Roxan Hockey  LIMA-LAD, SVG-OM1-OM3, SVG-D1, SVG-PDA.   [name] HIP ARTHROPLASTY Left 10/14/2015   Procedure: LEFT HIP HEMI ARTHROPLASTY;  Surgeon: [name], MD;  Location: [place];  Service: Orthopedics;  Laterality: Left;  . SHOULDER SURGERY     x2  . TONSILLECTOMY      Home Medications:  Prescriptions Prior to Admission  Medication Sig Dispense Refill Last Dose  . acetaminophen (TYLENOL) 325 MG tablet Take 1-2 tablets (325-650 mg total) by mouth every 6 (six) hours as needed for mild pain or moderate pain. (Patient taking differently: Take 325 mg by mouth every 6 (six) hours as needed for mild pain or moderate pain. ) 40 tablet 0 11/01/2016 at Unknown time  .  apixaban (ELIQUIS) 5 MG TABS tablet Take 1 tablet (5 mg total) by mouth 2 (two) times daily. 60 tablet 0 11/02/2016  . Carboxymethylcellulose Sodium (LUBRICANT EYE DROPS OP) Apply 1 drop to eye 2 (two) times daily as needed (dry eyes).   11/04/2016 at Unknown time  . CARTIA XT 120 MG 24 hr capsule TAKE ONE CAPSULE BY MOUTH ONCE DAILY 90 capsule 3 11/03/2016 at Unknown time  . Cholecalciferol (VITAMIN D3) 5000 units CAPS Take 5,000 Units by mouth daily.    Past Week at Unknown time  . finasteride (PROSCAR) 5 MG tablet Take 5 mg by mouth every evening.    11/03/2016 at Unknown time  . lovastatin (MEVACOR) 20 MG tablet TAKE ONE TABLET BY MOUTH ONCE DAILY AT BEDTIME 90 tablet 3 11/03/2016 at Unknown time  . meclizine (ANTIVERT) 25 MG tablet Take 1 tablet (25 mg total) by mouth 3 (three) times daily as needed for dizziness. 30 tablet 0 Past Month at Unknown time  . Multiple Vitamin (MULTIVITAMIN) capsule Take 1 capsule by mouth daily.    Past Week at Unknown time  . multivitamin-lutein (OCUVITE-LUTEIN) CAPS Take 1 capsule by mouth daily.    Past Week at Unknown time  . Probiotic Product (PROBIOTIC DAILY PO) Take 1 capsule by mouth daily.   Past Week at Unknown time  . silodosin (RAPAFLO) 8  MG CAPS capsule Take 8 mg by mouth daily with breakfast.   11/03/2016 at Unknown time  . Sunscreens (AVEENO ESSENTIAL MOISTURE EX) Apply 1 application topically daily.   11/03/2016 at Unknown time   Allergies:  Allergies  Allergen Reactions  . Dutasteride Diarrhea    GI upset and incontinence  . Toradol [Ketorolac Tromethamine] Other (See Comments)    CNS-AMS Dizzy and light headed    Family History  Problem Relation Age of Onset  . Coronary artery disease Father   . Hyperlipidemia Father   . Heart attack Father   . Alcohol abuse Father   . Bipolar disorder Mother   . Heart disease Mother   . Alcohol abuse Brother   . Cancer Brother   . Cancer Maternal Grandfather        oral cancer, chew tobacco  . Cancer Sister    . Glaucoma Sister   . Alcohol abuse Unknown    Social History:  reports that he quit smoking about 45 years ago. He has a 7.50 pack-year smoking history. He has never used smokeless tobacco. He reports that he drinks about 0.6 - 1.2 oz of alcohol per week . He reports that he does not use drugs.  Review of Systems  All other systems reviewed and are negative.   Physical Exam:  Vital signs in last 24 hours: Temp:  [97.8 F (36.6 C)] 97.8 F (36.6 C) (05/10 1021) Pulse Rate:  [112-132] 112 (05/10 1048) Resp:  [16] 16 (05/10 1021) BP: (136)/(98) 136/98 (05/10 1021) SpO2:  [98 %] 98 % (05/10 1021) Weight:  [63.6 kg (140 lb 4 oz)] 63.6 kg (140 lb 4 oz) (05/10 1021) Physical Exam  Constitutional: He is oriented to person, place, and time. He appears well-developed and well-nourished.  HENT:  Head: Normocephalic and atraumatic.  Eyes: EOM are normal. Pupils are equal, round, and reactive to light.  Neck: Normal range of motion. No thyromegaly present.  Cardiovascular: Normal rate and regular rhythm.   Respiratory: Effort normal. No respiratory distress.  GI: Soft. He exhibits no distension.  Musculoskeletal: Normal range of motion. He exhibits no edema.  Neurological: He is alert and oriented to person, place, and time.  Skin: Skin is warm and dry.  Psychiatric: He has a normal mood and affect. His behavior is normal. Judgment and thought content normal.    Laboratory Data:  No results found for this or any previous visit (from the past 24 hour(s)). No results found for this or any previous visit (from the past 240 hour(s)). Creatinine:  Recent Labs  11/01/16 1046  CREATININE 0.93   Baseline Creatinine: 0.9  Impression/Assessment:  83yo with BPh with LUTS, urinary retention  Plan:  The risks/benefits/alternatives to robotic simple prostatectomy was explained to the patient and he understands and wishes to proceed with surgery  Nicolette Bang 11/04/2016, 12:28 PM

## 2016-11-04 NOTE — Anesthesia Procedure Notes (Signed)

## 2016-11-04 NOTE — Anesthesia Postprocedure Evaluation (Addendum)
Anesthesia Post Note  Patient: Justin [name]  Procedure(s) Performed: Procedure(s) (LRB): XI ROBOTIC ASSISTED SIMPLE PROSTATECTOMY (N/A)  Patient location during evaluation: PACU Anesthesia Type: General Level of consciousness: awake, awake and alert and oriented Pain management: pain level controlled Vital Signs Assessment: post-procedure vital signs reviewed and stable Respiratory status: spontaneous breathing, nonlabored ventilation and respiratory function stable Cardiovascular status: blood pressure returned to baseline Anesthetic complications: no       Last Vitals:  Vitals:   11/04/16 1815 11/04/16 1830  BP: 126/84 119/89  Pulse: 84 93  Resp: 12 12  Temp:  36.5 C    Last Pain:  Vitals:   11/04/16 1842  TempSrc:   PainSc: 3                  Sharie Amorin COKER

## 2016-11-04 NOTE — Op Note (Signed)
PREOPERATIVE DIAGNOSIS: BPH with LUTS, urinary   POSTOPERATIVE DIAGNOSIS: Same  PROCEDURES: 1. Robotic-assisted laparoscopic simple prostatectomy.  ANESTHESIA: General  ASSISTANT: Clemetine Marker, PA   ESTIMATED BLOOD LOSS: 200 mL.  COMPLICATIONS: None.  SPECIMEN: 1.prostatic adenoma  ANTIBIOTICS: rocephin  FINDINGS: 4cm intravesical prostatic protrusion. Ureteral orifices in normal anatomic location. No leaks from cystotomy at 100cc of water.   DRAINS: 1. Jackson-Pratt drain to bulb suction. 2. 3 Way Foley catheter to straight drain.  INDICATION: Justin [name] is a very pleasant 83 year old gentleman, who has BPH with significant LUTS including urinary retention. His TRUS volume is 200cc.  Options were discussed with the patient in detail for primary manage including continued surveillance protocols versus surgical extirpation with and without minimally invasive assistance and he wished to proceed with robotic simple prostatectomy. Informed consent was obtained and placed in the medical record.  PROCEDURE IN DETAIL: The patient was brought to the operating room and a breif timeout was down to ensure correct patient, correct procedure, and correct site. Intravenous antibiotics were administered. General endotracheal anesthesia was introduced. The patient was placed into a low lithotomy position after tucking his arms with foam padding, placing on a pink and non-slide foam pad. A test of steep Trendelenburg positioning was performed and he was found to be suitably positioned. Sterile field was created by prepping and draping the patient's penis, perineum and proximal thighs using iodine and his infra-xiphoid abdomen using chlorhexidine gluconate. Next, a high-flow, low-pressure pneumoperitoneum was obtained using Veress technique in the infraumbilical midline having passed the aspiration and drop test. Next, a 8-mm robotic camera port was placed in the same  location. Laparoscopic examination of the peritoneal cavity revealed no significant adhesions and no visceral injury. Additional ports were placed as follows: Right paramedian 8-mm robotic port, right far lateral 12-mm assistant port, left paramedian 8-mm robotic port, left far lateral 8-mm robotic port, and right paramedian 5-mm suction port. Robot was docked and passed through the electronic checks. Next, attention was directed for the development of space of Retzius. Incision was made lateral to the left medial umbilical ligament from the midline towards the area of the internal ring and coursing along the iliac vessels towards the area of the ureter, which was positively identified. The left bladder was dissected away from the pelvic sidewall towards the area of the endopelvic fascia. A mirror-imaged dissection was performed on the right side.  Additional anterior attachments were taken down using cautery scissors. Next, the bladder neck was identified moving the Foley catheter back and forth. We then made a 8cm transverse cystotomy 3cm from the bladder neck. We identified the ureteral orifices and care was taken to exclude then from the dissection. We then placed 2 holding stitches in the anterior bladder wall and secured it to Coopers ligament.  We then made a circumscribing incision around the base of the prostate. We then used a 0 vicryl in a figure of eight fashion in the base of the adenoma for traction. We proceeded with a posterior dissection of the prostate until we identified the capsule. We then used a combination of electrocautery, blunt and sharp dissection to free the adenoma from the capsule. We then dissected laterally to the apex. Individual bleeders were cauterized. We placed 0 vicryl figure of 8 sutures at 5 and 7 o'clock for hemostasis in the prostatic fossas. We then dissected anteriorly along the capsule until we reached the apex. The adenoma was then freed and placed  in an endocatch bag. We noted  good hemostasis and no additional sutures were placed. A Foley catheter was then placed per urethra easily. We then tacked down the bladder neck to the prostatic fossa with a single interrupted 2-0 vicryl. We then proceeded to closed the cystotomy. We closed the bladder with a running 0 vicryl full thickness. We then performed a imbricating second layer  Closure with 0 vicryl. The bladder was then filled with 150cc of water and we noted no leak.  All sponge and needle counts were correct. A closed suction drain was brought to the previous left lateral robotic port site into the area of the peritoneal cavity. The previous right 12-mm assistant port was closed at the level of the fascia using a Carter-Thomason suture passer under laparoscopic vision. Robot was undocked. Specimen was retrieved by extending the previous camera port site inferiorly for distance approximately 3 cm and removing the prostatectomy specimens and setting aside for permanent pathology. The site was closed at the level of fascia using figure-of-eight 0 vicryl followed by reapproximation of the Scarpa's using running Vicryl. All incision sites were infiltrated with dilute Lyophilized Marcaine and closed at the level of the skin using subcuticular Monocryl followed by Dermabond. Procedure was then terminated. The patient tolerated the procedure well. There were no immediate periprocedural complications and the patient was taken to the postanesthesia care unit in stable Condition. The assistant was vital to the case and was utilized for retraction, irrigation, and instrument passage.   COMPLICATIONS: None  CONDITION: Stable, extubated, transferred to PACU  PLAN: The patient will be admitted for 1-2 for hydration, post operative monitoring and pain control. He will be discharged home with foley in place and foley will be removed in 14 days, He will have a cystogram prior to foley catheter  removal

## 2016-11-04 NOTE — Transfer of Care (Signed)
Immediate Anesthesia Transfer of Care Note  Patient: Justin [name]  Procedure(s) Performed: Procedure(s): XI ROBOTIC ASSISTED SIMPLE PROSTATECTOMY (N/A)  Patient Location: PACU  Anesthesia Type:General  Level of Consciousness:  sedated, patient cooperative and responds to stimulation  Airway & Oxygen Therapy:Patient Spontanous Breathing and Patient connected to face mask oxgen  Post-op Assessment:  Report given to PACU RN and Post -op Vital signs reviewed and stable  Post vital signs:  Reviewed and stable  Last Vitals:  Vitals:   11/04/16 1021 11/04/16 1048  BP: (!) 136/98   Pulse: (!) 132 (!) 112  Resp: 16   Temp: 12.1 C     Complications: No apparent anesthesia complications

## 2016-11-05 ENCOUNTER — Encounter (HOSPITAL_COMMUNITY): Payer: Self-pay | Admitting: Urology

## 2016-11-05 LAB — BASIC METABOLIC PANEL
ANION GAP: 8 (ref 5–15)
BUN: 13 mg/dL (ref 6–20)
CHLORIDE: 97 mmol/L — AB (ref 101–111)
CO2: 27 mmol/L (ref 22–32)
Calcium: 8.6 mg/dL — ABNORMAL LOW (ref 8.9–10.3)
Creatinine, Ser: 1.04 mg/dL (ref 0.61–1.24)
GFR calc Af Amer: >60 mL/min (ref >60)
GLUCOSE: 164 mg/dL — AB (ref 65–99)
POTASSIUM: 3.7 mmol/L (ref 3.5–5.1)
SODIUM: 132 mmol/L — AB (ref 135–145)

## 2016-11-05 LAB — HEMOGLOBIN AND HEMATOCRIT, BLOOD
HCT: 36.7 % — ABNORMAL LOW (ref 39.0–52.0)
HEMOGLOBIN: 11.9 g/dL — AB (ref 13.0–17.0)

## 2016-11-05 NOTE — Progress Notes (Signed)
Patient has gotten up to ambulate using one assist and a walker. Pt got out in the hall and walked about halfway and said he was feeling very dizzy and had to stand there for a minute but wanted to go back to room. Pt helped back into bed.

## 2016-11-05 NOTE — Care Management Note (Signed)
Case Management Note  Patient Details  Name: ISMAIL GRAZIANI MRN: [id number] Date of Birth: 03/20/1929  Subjective/Objective:  83 y/o m admitted w/BPH. From home.                  Action/Plan:d/c plan home.   Expected Discharge Date:                  Expected Discharge Plan:  Home/Self Care  In-House Referral:     Discharge planning Services  CM Consult  Post Acute Care Choice:    Choice offered to:     DME Arranged:    DME Agency:     HH Arranged:    HH Agency:     Status of Service:  In process, will continue to follow  If discussed at Long Length of Stay Meetings, dates discussed:    Additional Comments:  Dessa Phi, RN 11/05/2016, 2:05 PM

## 2016-11-05 NOTE — Progress Notes (Signed)
Pt catheter has remained bloody, clear throughout the night. Pt has continued to get IV fluids but output is reasonable. Patient hasn't complained of any pain in that area. RN did irrigate catheter to see if any clots were blocking extra fluid, no clots found. Continuing to monitor patient and patients color and amount of output. Cristy Hilts, RN

## 2016-11-05 NOTE — Progress Notes (Signed)
1 Day Post-Op Subjective: Patient reports incisional pain, tolerating PO and pain control good. Pt on cardizem drip post op which has been stopped.  Objective: Vital signs in last 24 hours: Temp:  [97.6 F (36.4 C)-98.4 F (36.9 C)] 98 F (36.7 C) (05/11 2036) Pulse Rate:  [100-114] 110 (05/11 2036) Resp:  [17-18] 18 (05/11 2036) BP: (135-140)/(87-90) 139/87 (05/11 2036) SpO2:  [97 %-98 %] 98 % (05/11 2036)  Intake/Output from previous day: 05/10 0701 - 05/11 0700 In: 4530.3 [P.O.:150; I.V.:4330.3; IV Piggyback:50] Out: 6433 [Urine:1300; Drains:15; Blood:200] Intake/Output this shift: No intake/output data recorded.  Physical Exam:  General:alert, cooperative and appears stated age GI: soft and tenderness: suprapubic Male genitalia: not done Extremities: extremities normal, atraumatic, no cyanosis or edema  Lab Results:  Recent Labs  11/04/16 1705 11/05/16 0547  HGB 13.5 11.9*  HCT 41.0 36.7*   BMET  Recent Labs  11/05/16 0547  NA 132*  K 3.7  CL 97*  CO2 27  GLUCOSE 164*  BUN 13  CREATININE 1.04  CALCIUM 8.6*   No results for input(s): LABPT, INR in the last 72 hours. No results for input(s): LABURIN in the last 72 hours. Results for orders placed or performed during the hospital encounter of 08/05/16  Urine culture     Status: Abnormal   Collection Time: 08/05/16 10:58 AM  Result Value Ref Range Status   Specimen Description URINE, CLEAN CATCH  Final   Special Requests NONE  Final   Culture 80,000 COLONIES/mL ENTEROCOCCUS FAECALIS (A)  Final   Report Status 08/07/2016 FINAL  Final   Organism ID, Bacteria ENTEROCOCCUS FAECALIS (A)  Final      Susceptibility   Enterococcus faecalis - MIC*    AMPICILLIN <=2 SENSITIVE Sensitive     LEVOFLOXACIN >=8 RESISTANT Resistant     NITROFURANTOIN <=16 SENSITIVE Sensitive     VANCOMYCIN 1 SENSITIVE Sensitive     * 80,000 COLONIES/mL ENTEROCOCCUS FAECALIS    Studies/Results: No results  found.  Assessment/Plan: 83yo POD #1 from robotic simple prostatectomy  Plan: 1. Clear liquid diet, advance tomorrow pending bowel function 2. Continue foley to straight drain. Irrigate prn 3. Continue PO cardizem   LOS: 1 day   Nicolette Bang 11/05/2016, 10:03 PM

## 2016-11-06 MED ORDER — DILTIAZEM HCL ER COATED BEADS 240 MG PO CP24
240.0000 mg | ORAL_CAPSULE | Freq: Every day | ORAL | Status: DC
  Administered 2016-11-07 – 2016-11-08 (×2): 240 mg via ORAL
  Filled 2016-11-06 (×2): qty 1

## 2016-11-06 MED ORDER — ACETAMINOPHEN 325 MG PO TABS
650.0000 mg | ORAL_TABLET | Freq: Four times a day (QID) | ORAL | Status: DC
  Administered 2016-11-06 – 2016-11-07 (×5): 650 mg via ORAL
  Filled 2016-11-06 (×6): qty 2

## 2016-11-06 MED ORDER — MORPHINE SULFATE (PF) 4 MG/ML IV SOLN
2.0000 mg | INTRAVENOUS | Status: DC | PRN
  Administered 2016-11-06: 4 mg via INTRAVENOUS
  Filled 2016-11-06: qty 1

## 2016-11-06 MED ORDER — METOPROLOL TARTRATE 5 MG/5ML IV SOLN
5.0000 mg | Freq: Once | INTRAVENOUS | Status: AC
  Administered 2016-11-06: 5 mg via INTRAVENOUS
  Filled 2016-11-06: qty 5

## 2016-11-06 MED ORDER — DILTIAZEM HCL 60 MG PO TABS
120.0000 mg | ORAL_TABLET | Freq: Once | ORAL | Status: AC
  Administered 2016-11-06: 120 mg via ORAL
  Filled 2016-11-06: qty 2

## 2016-11-06 NOTE — Progress Notes (Signed)
Patient is extremely confused and agitated. Patient has been trying to get out of bed repeatedly since 1910 this evening. The nursing staff have tried to do diversionary tactics and talked and walked with the patient but the patient is still very agitated.  The answering service was notified and the operator stated that she would contact the PCP on call for tonight.

## 2016-11-06 NOTE — Evaluation (Signed)
Physical Therapy Evaluation Patient Details Name: Justin [name] MRN: [id number] DOB: [birth year] Today's Date: 11/06/2016   History of Present Illness  Pt is an 83 year old male s/p robotic simple prostatectomy with PMHx of PAF, CAD, CABG, syncope and R hip hemiarthroplasty  Clinical Impression  Pt admitted with above diagnosis. Pt currently with functional limitations due to the deficits listed below (see PT Problem List).  Pt will benefit from skilled PT to increase their independence and safety with mobility to allow discharge to the venue listed below.  Spouse reports pt remains confused today since taking pain meds and she is questioning her ability to manage him at ILF in current condition.  Pt may need SNF however will continue to update d/c recommendations as pt progresses.  Pt and spouse also report pt's HR is normally 138 bpm at baseline (including taking meds) (?) so RN notified.     Follow Up Recommendations SNF;Supervision for mobility/OOB (may progress to HHPT however spouse worried about cognition and would prefer SNF if pt leaving today or tomorrow)    Equipment Recommendations  None recommended by PT    Recommendations for Other Services       Precautions / Restrictions Precautions Precautions: Fall Precaution Comments: monitor HR      Mobility  Bed Mobility Overal bed mobility: Needs Assistance Bed Mobility: Supine to Sit;Sit to Supine     Supine to sit: Min assist Sit to supine: Min assist   General bed mobility comments: slight assist for trunk upright and LEs onto bed  Transfers Overall transfer level: Needs assistance Equipment used: Rolling walker (2 wheeled) Transfers: Sit to/from Stand Sit to Stand: Min guard         General transfer comment: verbal cues for safe technique  Ambulation/Gait Ambulation/Gait assistance: Min guard Ambulation Distance (Feet): 80 Feet Assistive device: Rolling walker (2 wheeled) Gait Pattern/deviations:  Step-through pattern;Decreased stride length;Narrow base of support     General Gait Details: short quick steps, pt agreeable to use RW for more UE support, min/guard for safety, distance limited by elevated HR 158 bpm  Stairs            Wheelchair Mobility    Modified Rankin (Stroke Patients Only)       Balance Overall balance assessment: History of Falls;Needs assistance         Standing balance support: Bilateral upper extremity supported Standing balance-Leahy Scale: Poor Standing balance comment: requiring bil UE support today               High Level Balance Comments: hx of fall during a balance class resulting in fx s/p R hip hemiarthroplasty, pt had outpatient PT for balance and gait training end of last GUYQ(0347)             Pertinent Vitals/Pain Pain Assessment: No/denies pain    Home Living   Living Arrangements: Spouse/significant other   Type of Home: Independent living facility         Home Equipment: Gilford Rile - 2 wheels;Cane - single point Additional Comments: from [employer]    Prior Function Level of Independence: Independent with assistive device(s)         Comments: uses SPC, takes exercise classes      Hand Dominance        Extremity/Trunk Assessment        Lower Extremity Assessment Lower Extremity Assessment: Generalized weakness       Communication   Communication: No difficulties  Cognition Arousal/Alertness: Awake/alert  Overall Cognitive Status: Impaired/Different from baseline                                 General Comments: spouse reports confusion today from pain meds, pt able to follow simple commands      General Comments      Exercises     Assessment/Plan    PT Assessment Patient needs continued PT services  PT Problem List Decreased strength;Decreased mobility;Decreased safety awareness;Decreased activity tolerance;Decreased knowledge of use of DME       PT  Treatment Interventions Gait training;DME instruction;Therapeutic activities;Therapeutic exercise;Functional mobility training;Patient/family education;Balance training    PT Goals (Current goals can be found in the Care Plan section)  Acute Rehab PT Goals PT Goal Formulation: With patient Time For Goal Achievement: 11/13/16 Potential to Achieve Goals: Good    Frequency Min 3X/week   Barriers to discharge        Co-evaluation               AM-PAC PT "6 Clicks" Daily Activity  Outcome Measure Difficulty turning over in bed (including adjusting bedclothes, sheets and blankets)?: None Difficulty moving from lying on back to sitting on the side of the bed? : A Little Difficulty sitting down on and standing up from a chair with arms (e.g., wheelchair, bedside commode, etc,.)?: A Little Help needed moving to and from a bed to chair (including a wheelchair)?: A Little Help needed walking in hospital room?: A Little Help needed climbing 3-5 steps with a railing? : A Lot 6 Click Score: 18    End of Session Equipment Utilized During Treatment: Gait belt Activity Tolerance: Patient tolerated treatment well Patient left: in bed;with call bell/phone within reach;with bed alarm set;with family/visitor present Nurse Communication: Mobility status PT Visit Diagnosis: Difficulty in walking, not elsewhere classified (R26.2)    Time: 5188-4166 PT Time Calculation (min) (ACUTE ONLY): 17 min   Charges:   PT Evaluation $PT Eval Low Complexity: 1 Procedure     PT G CodesCarmelia Bake, PT, DPT 11/06/2016 Pager: [phone]   York Ram E 11/06/2016, 3:17 PM

## 2016-11-06 NOTE — Progress Notes (Signed)
Pt on telemetry noted to have 5 Ventricular beats. VSS and no acute changes noted in Pt's assessment. Lorayne Bender Resident for Urology aware and new orders placed. Maintain current plan of care.

## 2016-11-06 NOTE — Progress Notes (Signed)
2 Days Post-Op Subjective: Patient reports incisional pain, tolerating PO and pain control good. Delirium overnight secondary to hydrocodone. Not ambulating.   Objective: Vital signs in last 24 hours: Temp:  [97.8 F (36.6 C)-98.4 F (36.9 C)] 97.8 F (36.6 C) (05/12 0505) Pulse Rate:  [100-117] 117 (05/12 0505) Resp:  [18] 18 (05/12 0505) BP: (131-140)/(71-90) 131/71 (05/12 0505) SpO2:  [97 %-98 %] 97 % (05/12 0505)  Intake/Output from previous day: 05/11 0701 - 05/12 0700 In: 1281.7 [P.O.:360; I.V.:921.7] Out: 1550 [Urine:1500; Drains:50] Intake/Output this shift: No intake/output data recorded.  Physical Exam:  General:alert, cooperative and appears stated age GI: soft and tenderness: suprapubic Male genitalia: not done Extremities: extremities normal, atraumatic, no cyanosis or edema  Lab Results:  Recent Labs  11/04/16 1705 11/05/16 0547  HGB 13.5 11.9*  HCT 41.0 36.7*   BMET  Recent Labs  11/05/16 0547  NA 132*  K 3.7  CL 97*  CO2 27  GLUCOSE 164*  BUN 13  CREATININE 1.04  CALCIUM 8.6*   No results for input(s): LABPT, INR in the last 72 hours. No results for input(s): LABURIN in the last 72 hours. Results for orders placed or performed during the hospital encounter of 08/05/16  Urine culture     Status: Abnormal   Collection Time: 08/05/16 10:58 AM  Result Value Ref Range Status   Specimen Description URINE, CLEAN CATCH  Final   Special Requests NONE  Final   Culture 80,000 COLONIES/mL ENTEROCOCCUS FAECALIS (A)  Final   Report Status 08/07/2016 FINAL  Final   Organism ID, Bacteria ENTEROCOCCUS FAECALIS (A)  Final      Susceptibility   Enterococcus faecalis - MIC*    AMPICILLIN <=2 SENSITIVE Sensitive     LEVOFLOXACIN >=8 RESISTANT Resistant     NITROFURANTOIN <=16 SENSITIVE Sensitive     VANCOMYCIN 1 SENSITIVE Sensitive     * 80,000 COLONIES/mL ENTEROCOCCUS FAECALIS    Studies/Results: No results found.  Assessment/Plan: 83yo POD #1  from robotic simple prostatectomy  Plan: 1. Advance to regular diet, ML IV fluids 2. Continue foley, irrigate prn.  3. Continue JP drain until discharge. 4. Continue cardizem. 5. Stop Hydrocodone. Reorient prn.  6. PT and case management consult.   LOS: 2 days   Aeralyn Barna R 11/06/2016, 7:34 AM

## 2016-11-07 MED ORDER — BISACODYL 10 MG RE SUPP
10.0000 mg | Freq: Once | RECTAL | Status: AC
  Administered 2016-11-07: 10 mg via RECTAL
  Filled 2016-11-07: qty 1

## 2016-11-07 MED ORDER — DILTIAZEM HCL ER COATED BEADS 120 MG PO CP24: 240.0000 mg | ORAL_CAPSULE | Freq: Every day | ORAL | 3 refills | Status: DC

## 2016-11-07 MED ORDER — TRAMADOL HCL 50 MG PO TABS: 50.0000 mg | ORAL_TABLET | Freq: Four times a day (QID) | ORAL | 0 refills | Status: DC | PRN

## 2016-11-07 NOTE — NC FL2 (Signed)
[name] LEVEL OF CARE SCREENING TOOL     IDENTIFICATION  Patient Name: Justin [name] Birthdate: April 26, 1929 Sex: male Admission Date (Current Location): 11/04/2016  [facility] and [place] Number:  Herbalist and Address:  [facility],  [place] [address], [place]      Provider Number: [id number]  Attending Physician Name and Address:  Cleon Gustin, MD  Relative Name and Phone Number:       Current Level of Care: Hospital Recommended Level of Care: [place] Prior Approval Number:    Date Approved/Denied:   PASRR Number: [id number]  Discharge Plan: SNF    Current Diagnoses: Patient Active Problem List   Diagnosis Date Noted  . BPH with urinary obstruction 11/04/2016  . H/O prostatectomy 11/04/2016  . Fall   . Closed left hip fracture ([place]) 10/13/2015  . Medicare annual wellness visit, subsequent 03/16/2015  . Paroxysmal atrial fibrillation ([place]) 09/26/2014  . Orthostatic hypotension 09/15/2014  . Head trauma 09/14/2014  . Alteration in anticoagulation 09/14/2014  . Tinnitus 03/21/2014  . Epigastric pain 02/01/2013  . Gait instability 01/01/2013  . Disequilibrium 04/03/2012  . Hyperglycemia 09/13/2011  . Aortic stenosis 03/10/2011  . CAD, ARTERY BYPASS GRAFT 02/23/2010  . Hyperlipidemia 07/04/2008  . Essential hypertension 07/04/2008  . Coronary atherosclerosis 07/04/2008  . RHINITIS 07/04/2008  . BPH (benign prostatic hyperplasia) 07/04/2008    Orientation RESPIRATION BLADDER Height & Weight     Self, Time, Situation, Place  Normal Continent Weight: 140 lb 4 oz (63.6 kg) Height:  5\' 5"  (165.1 cm)  BEHAVIORAL SYMPTOMS/MOOD NEUROLOGICAL BOWEL NUTRITION STATUS      Continent Diet (diet regular/fluid consistency thin)  AMBULATORY STATUS COMMUNICATION OF NEEDS Skin       Surgical wounds                       Personal Care Assistance Level of Assistance              Functional  Limitations Info             SPECIAL CARE FACTORS FREQUENCY                       Contractures      Additional Factors Info  Code Status, Allergies Code Status Info: full code Allergies Info: Dutasteride, Toradol Ketorolac Tromethamine           Current Medications (11/07/2016):  This is the current hospital active medication list Current Facility-Administered Medications  Medication Dose Route Frequency Provider Last Rate Last Dose  . acetaminophen (TYLENOL) tablet 650 mg  650 mg Oral Q6H WA Cleon Gustin, MD   650 mg at 11/07/16 1300  . diltiazem (CARDIZEM CD) 24 hr capsule 240 mg  240 mg Oral Daily Cleon Gustin, MD   240 mg at 11/07/16 0902  . diphenhydrAMINE (BENADRYL) injection 12.5 mg  12.5 mg Intravenous Q6H PRN Debbrah Alar, PA-C   12.5 mg at 11/06/16 1945   Or  . diphenhydrAMINE (BENADRYL) 12.5 MG/5ML elixir 12.5 mg  12.5 mg Oral Q6H PRN Debbrah Alar, PA-C      . finasteride (PROSCAR) tablet 5 mg  5 mg Oral QPM Dancy, Amanda, PA-C   5 mg at 11/06/16 1750  . meclizine (ANTIVERT) tablet 25 mg  25 mg Oral TID PRN Debbrah Alar, PA-C      . morphine 4 MG/ML injection 2-4 mg  2-4 mg Intravenous  Q2H PRN Cleon Gustin, MD   4 mg at 11/06/16 2315  . ondansetron (ZOFRAN) injection 4 mg  4 mg Intravenous Q4H PRN Dancy, Amanda, PA-C      . polyvinyl alcohol (LIQUIFILM TEARS) 1.4 % ophthalmic solution 1 drop  1 drop Both Eyes PRN Jimmey Ralph, NP   1 drop at 11/04/16 2354  . pravastatin (PRAVACHOL) tablet 20 mg  20 mg Oral q1800 Debbrah Alar, PA-C   20 mg at 11/06/16 1749     Discharge Medications: Please see discharge summary for a list of discharge medications.  Relevant Imaging Results:  Relevant Lab Results:   Additional [place], LCSW

## 2016-11-07 NOTE — Discharge Summary (Signed)
Date of admission: 11/04/2016  Date of discharge: 11/08/2016  Admission diagnosis: BPH  Discharge diagnosis: Same  History and Physical: For full details, please see admission history and physical. Briefly, Justin [name] is a 83 y.o. male with BPH with LUTS. After discussing management/treatment options, he  elected to proceed with surgical treatment.  Hospital Course: Justin [name] was taken to the operating room on 11/04/2016 and underwent a robotic simple prostatectomy. He tolerated this procedure well and without complications. Postoperatively, the patient was able to be transferred to a regular hospital room following recovery from anesthesia.  They were able to begin ambulating the night of surgery and remained hemodynamically stable overnight. His diet was slowly advanced. He had issues with pain, delirium, and tachycardia on this admission. By discharge, his delirium was resolved. His pain was controlled. His Cardizem dose was doubled to 240 daily with improvement in his rate. He has close cardiology follow-up. PT and CM evaluated the patient for dispo. He met all discharge criteria and was able to be discharged to [facility] on POD# 4.  Laboratory values:   Recent Labs  11/04/16 1705 11/05/16 0547  HGB 13.5 11.9*  HCT 41.0 36.7*    Disposition: Home  Discharge instruction: They were instructed to be ambulatory but to refrain from heavy lifting, strenuous activity, or driving.  Discharge medications:   Allergies as of 11/07/2016      Reactions   Dutasteride Diarrhea   GI upset and incontinence   Toradol [ketorolac Tromethamine] Other (See Comments)   CNS-AMS Dizzy and light headed      Medication List    STOP taking these medications   apixaban 5 MG Tabs tablet Commonly known as:  ELIQUIS   multivitamin capsule   multivitamin-lutein Caps capsule   silodosin 8 MG Caps capsule Commonly known as:  RAPAFLO   Vitamin D3 5000 units Caps     TAKE these  medications   acetaminophen 325 MG tablet Commonly known as:  TYLENOL Take 1-2 tablets (325-650 mg total) by mouth every 6 (six) hours as needed for mild pain or moderate pain. What changed:  how much to take   AVEENO ESSENTIAL MOISTURE EX Apply 1 application topically daily.   diltiazem 120 MG 24 hr capsule Commonly known as:  CARTIA XT Take 2 capsules (240 mg total) by mouth daily. What changed:  See the new instructions.   finasteride 5 MG tablet Commonly known as:  PROSCAR Take 5 mg by mouth every evening.   lovastatin 20 MG tablet Commonly known as:  MEVACOR TAKE ONE TABLET BY MOUTH ONCE DAILY AT BEDTIME   LUBRICANT EYE DROPS OP Apply 1 drop to eye 2 (two) times daily as needed (dry eyes).   meclizine 25 MG tablet Commonly known as:  ANTIVERT Take 1 tablet (25 mg total) by mouth 3 (three) times daily as needed for dizziness.   PROBIOTIC DAILY PO Take 1 capsule by mouth daily.   sulfamethoxazole-trimethoprim 800-160 MG tablet Commonly known as:  BACTRIM DS,SEPTRA DS Take 1 tablet by mouth 2 (two) times daily. Start the day prior to foley removal appointment   traMADol 50 MG tablet Commonly known as:  ULTRAM Take 1 tablet (50 mg total) by mouth every 6 (six) hours as needed for severe pain.       Followup: He will follow-up as scheduled with Dr. Alyson Ingles.

## 2016-11-07 NOTE — Care Management Important Message (Signed)
Important Message  Patient Details  Name: CASHAWN YANKO MRN: [id number] Date of Birth: [birth year]   Medicare Important Message Given:  Yes    Erenest Rasher, RN 11/07/2016, 10:19 AM

## 2016-11-07 NOTE — Care Management Note (Addendum)
Case Management Note  Patient Details  Name: Justin [name] MRN: [id number] Date of Birth: Nov 09, 1928  Subjective/Objective:    POD #1 from robotic simple prostatectomy                Action/Plan: Discharge Planning: NCM spoke to pt and wife, Sonia Baller at bedside. Pt has RW and cane at home. States they currently live in [place] apt. Wife prefers pt go to their Rehab SNF. CSW referral for SNF rehab.   PCP [name] A MD  Expected Discharge Date:  11/08/2016                Expected Discharge Plan:  [place]  In-House Referral:  Clinical Social Work  Discharge planning Services  CM Consult  Post Acute Care Choice:  NA Choice offered to:  NA  DME Arranged:  N/A DME Agency:  NA  HH Arranged:  NA HH Agency:  NA  Status of Service:  Completed, signed off  If discussed at [place] of Stay Meetings, dates discussed:    Additional Comments:  Erenest Rasher, RN 11/07/2016, 10:24 AM

## 2016-11-07 NOTE — Progress Notes (Signed)
3 Days Post-Op Subjective: Patient reports incisional pain, tolerating PO and pain control good. Delirium improved. HR improved after increasing Cardizem dose. PT recommended SNF. CM reports patient must stay inpatient until tomorrow.   Objective: Vital signs in last 24 hours: Temp:  [97.6 F (36.4 C)-98.4 F (36.9 C)] 97.6 F (36.4 C) (05/13 0511) Pulse Rate:  [66-155] 66 (05/13 0511) Resp:  [16-20] 18 (05/13 0511) BP: (119-156)/(79-98) 119/79 (05/13 0511) SpO2:  [96 %-97 %] 96 % (05/13 0511)  Intake/Output from previous day: 05/12 0701 - 05/13 0700 In: 240 [P.O.:240] Out: 2007 [Urine:1925; Drains:82] Intake/Output this shift: Total I/O In: 220 [P.O.:220] Out: 300 [Urine:300]  Physical Exam:  General:alert, cooperative and appears stated age GI: soft and tenderness: suprapubic Male genitalia: not done Extremities: extremities normal, atraumatic, no cyanosis or edema  Lab Results:  Recent Labs  11/04/16 1705 11/05/16 0547  HGB 13.5 11.9*  HCT 41.0 36.7*   BMET  Recent Labs  11/05/16 0547  NA 132*  K 3.7  CL 97*  CO2 27  GLUCOSE 164*  BUN 13  CREATININE 1.04  CALCIUM 8.6*   No results for input(s): LABPT, INR in the last 72 hours. No results for input(s): LABURIN in the last 72 hours. Results for orders placed or performed during the hospital encounter of 08/05/16  Urine culture     Status: Abnormal   Collection Time: 08/05/16 10:58 AM  Result Value Ref Range Status   Specimen Description URINE, CLEAN CATCH  Final   Special Requests NONE  Final   Culture 80,000 COLONIES/mL ENTEROCOCCUS FAECALIS (A)  Final   Report Status 08/07/2016 FINAL  Final   Organism ID, Bacteria ENTEROCOCCUS FAECALIS (A)  Final      Susceptibility   Enterococcus faecalis - MIC*    AMPICILLIN <=2 SENSITIVE Sensitive     LEVOFLOXACIN >=8 RESISTANT Resistant     NITROFURANTOIN <=16 SENSITIVE Sensitive     VANCOMYCIN 1 SENSITIVE Sensitive     * 80,000 COLONIES/mL ENTEROCOCCUS  FAECALIS    Studies/Results: No results found.  Assessment/Plan: 83yo POD #3 from robotic simple prostatectomy  Plan: 1. Remove JP drain 2. Foley to straight drain 3. Dulcolax suppository.  4. Cardizem increased to 240 mg daily with improvement. Patient has close f/u with cardiology. 5. PT and CM to see, coordinating dispo, likely discharge tomorrow, 5/14 to rehab unit of patient's facility.    LOS: 3 days   Kron Everton R 11/07/2016, 10:51 AM

## 2016-11-07 NOTE — Progress Notes (Signed)
MD on call was paged through the answering service. Patient had 18 secs of PVCs. Vitals were: 97.83F;HR 85;RR 15;124/98; 97 % Room air

## 2016-11-07 NOTE — Discharge Instructions (Addendum)
1. Activity:  You are encouraged to ambulate frequently (about every hour during waking hours) to help prevent blood clots from forming in your legs or lungs.  However, you should not engage in any heavy lifting (> 10-15 lbs), strenuous activity, or straining. 2. Diet: You should continue a clear liquid diet until passing gas from below.  Once this occurs, you may advance your diet to a soft diet that would be easy to digest (i.e soups, scrambled eggs, mashed potatoes, etc.) for 24 hours just as you would if getting over a bad stomach flu.  If tolerating this diet well for 24 hours, you may then begin eating regular food.  It will be normal to have some amount of bloating, nausea, and abdominal discomfort intermittently. 3. Prescriptions:  You will be provided a prescription for pain medication to take as needed.  If your pain is not severe enough to require the prescription pain medication, you may take Tylenol instead.  You should also take an over the counter stool softener (Colace 100 mg twice daily) to avoid straining with bowel movements as the pain medication may constipate you. Finally, you will also be provided a prescription for an antibiotic to begin the day prior to your return visit in the office for catheter removal. 4. Catheter care: You will be taught how to take care of the catheter by the nursing staff prior to discharge from the hospital.  You may use both a leg bag and the larger bedside bag but it is recommended to at least use the bigger bedside bag at nighttime as the leg bag is small and will fill up overnight and also does not drain as well when lying flat. You may periodically feel a strong urge to void with the catheter in place.  This is a bladder spasm and most often can occur when having a bowel movement or when you are moving around. It is typically self-limited and usually will stop after a few minutes.  You may use some Vaseline or Neosporin around the tip of the catheter to  reduce friction at the tip of the penis. 5. Incisions: You may remove your dressing bandages the 2nd day after surgery.  You most likely will have a few small staples in each of the incisions and once the bandages are removed, the incisions may stay open to air.  You may start showering (not soaking or bathing in water) 48 hours after surgery and the incisions simply need to be patted dry after the shower.  No additional care is needed. 6. What to call us about: You should call the office [phone]) if you develop fever > 101, persistent vomiting, or the catheter stops draining. Also, feel free to call with any other questions you may have and remember the handout that was provided to you as a reference preoperatively which answers many of the common questions that arise after surgery. 7. You may resume Eliquis, aspirin, advil, aleve, vitamins, and supplements 7 days after surgery. 8. Take two pills of your Cardizem daily (for a total 240mg  dose). Follow-up with your cardiologist as scheduled. If you feel significantly weak or lightheaded, call a physician or proceed to the ER. 9. Start taking the antibiotic (Bactrim) the day BEFORE your follow-up appointment to get your foley catheter removed.

## 2016-11-08 DIAGNOSIS — R2681 Unsteadiness on feet: Secondary | ICD-10-CM | POA: Diagnosis not present

## 2016-11-08 DIAGNOSIS — R339 Retention of urine, unspecified: Secondary | ICD-10-CM | POA: Diagnosis not present

## 2016-11-08 DIAGNOSIS — N401 Enlarged prostate with lower urinary tract symptoms: Secondary | ICD-10-CM | POA: Diagnosis not present

## 2016-11-08 DIAGNOSIS — Z9079 Acquired absence of other genital organ(s): Secondary | ICD-10-CM | POA: Diagnosis not present

## 2016-11-08 DIAGNOSIS — Z48816 Encounter for surgical aftercare following surgery on the genitourinary system: Secondary | ICD-10-CM | POA: Diagnosis not present

## 2016-11-08 DIAGNOSIS — M6281 Muscle weakness (generalized): Secondary | ICD-10-CM | POA: Diagnosis not present

## 2016-11-08 NOTE — Care Management Note (Signed)
Case Management Note  Patient Details  Name: JOHNSTON MADDOCKS MRN: [id number] Date of Birth: 11-03-1928  Subjective/Objective:  CSW already following for d/c plan SNF.                  Action/Plan:d/c SNF.   Expected Discharge Date:  11/08/16               Expected Discharge Plan:  Skilled Nursing Facility  In-House Referral:  Clinical Social Work  Discharge planning Services  CM Consult  Post Acute Care Choice:  NA Choice offered to:  NA  DME Arranged:  N/A DME Agency:  NA  HH Arranged:  NA HH Agency:  NA  Status of Service:  Completed, signed off  If discussed at [employer] of Stay Meetings, dates discussed:    Additional Comments:  Dessa Phi, RN 11/08/2016, 12:25 PM

## 2016-11-08 NOTE — Clinical Social Work Note (Signed)
Patient has a rehab bed at [name] today should he be discharged.    Dede Query, [place] Worker - cell #: [phone]

## 2016-11-08 NOTE — Clinical Social Work Note (Signed)
LCSW spoke with RN who reported that patient's wife will be transporting him to rehab in her vehicle.  LCSW sent discharge summary through the hub.  Dede Query, LCSW [place] Worker - Weekend Coverage cell #: [phone]

## 2016-11-08 NOTE — Progress Notes (Signed)
Physical Therapy Treatment Patient Details Name: Justin [name] MRN: [id number] DOB: 1929/02/04 Today's Date: 11/08/2016    History of Present Illness Pt is an 83 year old male s/p robotic simple prostatectomy with PMHx of PAF, CAD, CABG, syncope and R hip hemiarthroplasty    PT Comments    Pt's cognition much improved today.  Pt ambulated in hallway with fluctuating HR.  Pt also able to perform a few exercises.  Follow Up Recommendations  SNF;Supervision for mobility/OOB     Equipment Recommendations  None recommended by PT    Recommendations for Other Services       Precautions / Restrictions Precautions Precautions: Fall Precaution Comments: monitor HR  (spouse reports pt off eliquis s/p procedure for a few days which usually does a better job at controlling his HR - afib)   Mobility  Bed Mobility               General bed mobility comments: pt up in recliner on arrival  Transfers Overall transfer level: Needs assistance Equipment used: Rolling walker (2 wheeled) Transfers: Sit to/from Stand Sit to Stand: Min guard         General transfer comment: verbal cues for safe technique  Ambulation/Gait Ambulation/Gait assistance: Min guard Ambulation Distance (Feet): 80 Feet (x2) Assistive device: Rolling walker (2 wheeled) Gait Pattern/deviations: Step-through pattern;Decreased stride length     General Gait Details: min/guard for safety, steady with RW, HR flucuating throughout ambulation 130s-166 bpm, seated rest break due to elevated HR   Stairs            Wheelchair Mobility    Modified Rankin (Stroke Patients Only)       Balance                                            Cognition Arousal/Alertness: Awake/alert Behavior During Therapy: WFL for tasks assessed/performed Overall Cognitive Status: Within Functional Limits for tasks assessed                                        Exercises General  Exercises - Lower Extremity Long Arc Quad: AROM;Seated;10 reps;Both Hip Flexion/Marching: AROM;Seated;Both;10 reps Heel Raises: AROM;Both;10 reps;Standing (standing exercises with RW) Mini-Sqauts: AROM;Both;10 reps;Standing    General Comments        Pertinent Vitals/Pain Pain Assessment: No/denies pain    Home Living                      Prior Function            PT Goals (current goals can now be found in the care plan section) Progress towards PT goals: Progressing toward goals    Frequency    Min 3X/week      PT Plan Current plan remains appropriate    Co-evaluation              AM-PAC PT "6 Clicks" Daily Activity  Outcome Measure  Difficulty turning over in bed (including adjusting bedclothes, sheets and blankets)?: None Difficulty moving from lying on back to sitting on the side of the bed? : A Little Difficulty sitting down on and standing up from a chair with arms (e.g., wheelchair, bedside commode, etc,.)?: A Little Help needed moving to and from a bed to chair (  including a wheelchair)?: A Little Help needed walking in hospital room?: A Little Help needed climbing 3-5 steps with a railing? : A Little 6 Click Score: 19    End of Session   Activity Tolerance: Patient tolerated treatment well Patient left: with call bell/phone within reach;with family/visitor present;in chair   PT Visit Diagnosis: Difficulty in walking, not elsewhere classified (R26.2)     Time: 1000-1017 PT Time Calculation (min) (ACUTE ONLY): 17 min  Charges:  $Gait Training: 8-22 mins                    G Codes:      Carmelia Bake, PT, DPT 11/08/2016 Pager: [phone]    York Ram E 11/08/2016, 12:23 PM

## 2016-11-09 DIAGNOSIS — M6281 Muscle weakness (generalized): Secondary | ICD-10-CM | POA: Diagnosis not present

## 2016-11-09 DIAGNOSIS — I2510 Atherosclerotic heart disease of native coronary artery without angina pectoris: Secondary | ICD-10-CM | POA: Diagnosis not present

## 2016-11-09 DIAGNOSIS — N401 Enlarged prostate with lower urinary tract symptoms: Secondary | ICD-10-CM | POA: Diagnosis not present

## 2016-11-09 DIAGNOSIS — I10 Essential (primary) hypertension: Secondary | ICD-10-CM | POA: Diagnosis not present

## 2016-11-09 DIAGNOSIS — I4891 Unspecified atrial fibrillation: Secondary | ICD-10-CM | POA: Diagnosis not present

## 2016-11-10 ENCOUNTER — Ambulatory Visit (INDEPENDENT_AMBULATORY_CARE_PROVIDER_SITE_OTHER): Payer: PPO | Admitting: Cardiology

## 2016-11-10 ENCOUNTER — Encounter: Payer: Self-pay | Admitting: Cardiology

## 2016-11-10 VITALS — BP 119/78 | HR 114 | Ht 65.0 in | Wt 139.1 lb

## 2016-11-10 DIAGNOSIS — E639 Nutritional deficiency, unspecified: Secondary | ICD-10-CM | POA: Diagnosis not present

## 2016-11-10 DIAGNOSIS — I2510 Atherosclerotic heart disease of native coronary artery without angina pectoris: Secondary | ICD-10-CM

## 2016-11-10 DIAGNOSIS — I480 Paroxysmal atrial fibrillation: Secondary | ICD-10-CM

## 2016-11-10 DIAGNOSIS — I10 Essential (primary) hypertension: Secondary | ICD-10-CM

## 2016-11-10 DIAGNOSIS — E7800 Pure hypercholesterolemia, unspecified: Secondary | ICD-10-CM

## 2016-11-10 DIAGNOSIS — Z48816 Encounter for surgical aftercare following surgery on the genitourinary system: Secondary | ICD-10-CM | POA: Diagnosis not present

## 2016-11-10 DIAGNOSIS — E559 Vitamin D deficiency, unspecified: Secondary | ICD-10-CM | POA: Diagnosis not present

## 2016-11-10 MED ORDER — AMIODARONE HCL 200 MG PO TABS: ORAL_TABLET | ORAL | 6 refills | Status: DC

## 2016-11-10 NOTE — Patient Instructions (Signed)
Medication Instructions:   START AMIODARONE 200 MG ONE TABLET TWICE DAILY X 2 WEEKS THEN ONE TABLET ONCE DAILY  Follow-Up:  Your physician recommends that you schedule a follow-up appointment in: [place]   If you need a refill on your cardiac medications before your next appointment, please call your pharmacy.

## 2016-11-23 DIAGNOSIS — N401 Enlarged prostate with lower urinary tract symptoms: Secondary | ICD-10-CM | POA: Diagnosis not present

## 2016-11-23 DIAGNOSIS — R338 Other retention of urine: Secondary | ICD-10-CM | POA: Diagnosis not present

## 2016-11-24 ENCOUNTER — Emergency Department (HOSPITAL_BASED_OUTPATIENT_CLINIC_OR_DEPARTMENT_OTHER): Payer: PPO

## 2016-11-24 ENCOUNTER — Encounter (HOSPITAL_BASED_OUTPATIENT_CLINIC_OR_DEPARTMENT_OTHER): Payer: Self-pay | Admitting: *Deleted

## 2016-11-24 ENCOUNTER — Inpatient Hospital Stay (HOSPITAL_BASED_OUTPATIENT_CLINIC_OR_DEPARTMENT_OTHER)
Admission: EM | Admit: 2016-11-24 | Discharge: 2016-11-26 | DRG: 640 | Disposition: A | Discharge status: Home Health | Payer: PPO | Attending: Family Medicine | Admitting: Family Medicine

## 2016-11-24 ENCOUNTER — Inpatient Hospital Stay (HOSPITAL_COMMUNITY): Payer: PPO

## 2016-11-24 DIAGNOSIS — R531 Weakness: Secondary | ICD-10-CM

## 2016-11-24 DIAGNOSIS — R319 Hematuria, unspecified: Secondary | ICD-10-CM | POA: Diagnosis present

## 2016-11-24 DIAGNOSIS — T83511A Infection and inflammatory reaction due to indwelling urethral catheter, initial encounter: Secondary | ICD-10-CM | POA: Diagnosis present

## 2016-11-24 DIAGNOSIS — N138 Other obstructive and reflux uropathy: Secondary | ICD-10-CM | POA: Diagnosis present

## 2016-11-24 DIAGNOSIS — E871 Hypo-osmolality and hyponatremia: Secondary | ICD-10-CM | POA: Diagnosis not present

## 2016-11-24 DIAGNOSIS — I25810 Atherosclerosis of coronary artery bypass graft(s) without angina pectoris: Secondary | ICD-10-CM | POA: Diagnosis not present

## 2016-11-24 DIAGNOSIS — I480 Paroxysmal atrial fibrillation: Secondary | ICD-10-CM | POA: Diagnosis not present

## 2016-11-24 DIAGNOSIS — I10 Essential (primary) hypertension: Secondary | ICD-10-CM | POA: Diagnosis present

## 2016-11-24 DIAGNOSIS — Z951 Presence of aortocoronary bypass graft: Secondary | ICD-10-CM

## 2016-11-24 DIAGNOSIS — D509 Iron deficiency anemia, unspecified: Secondary | ICD-10-CM | POA: Diagnosis present

## 2016-11-24 DIAGNOSIS — N3001 Acute cystitis with hematuria: Secondary | ICD-10-CM

## 2016-11-24 DIAGNOSIS — R739 Hyperglycemia, unspecified: Secondary | ICD-10-CM | POA: Diagnosis present

## 2016-11-24 DIAGNOSIS — Z85828 Personal history of other malignant neoplasm of skin: Secondary | ICD-10-CM

## 2016-11-24 DIAGNOSIS — R0602 Shortness of breath: Secondary | ICD-10-CM | POA: Diagnosis not present

## 2016-11-24 DIAGNOSIS — I110 Hypertensive heart disease with heart failure: Secondary | ICD-10-CM | POA: Diagnosis present

## 2016-11-24 DIAGNOSIS — E785 Hyperlipidemia, unspecified: Secondary | ICD-10-CM | POA: Diagnosis not present

## 2016-11-24 DIAGNOSIS — I2510 Atherosclerotic heart disease of native coronary artery without angina pectoris: Secondary | ICD-10-CM | POA: Diagnosis present

## 2016-11-24 DIAGNOSIS — Z8249 Family history of ischemic heart disease and other diseases of the circulatory system: Secondary | ICD-10-CM | POA: Diagnosis not present

## 2016-11-24 DIAGNOSIS — I252 Old myocardial infarction: Secondary | ICD-10-CM

## 2016-11-24 DIAGNOSIS — I5023 Acute on chronic systolic (congestive) heart failure: Secondary | ICD-10-CM | POA: Diagnosis not present

## 2016-11-24 DIAGNOSIS — Z9079 Acquired absence of other genital organ(s): Secondary | ICD-10-CM

## 2016-11-24 DIAGNOSIS — T502X5A Adverse effect of carbonic-anhydrase inhibitors, benzothiadiazides and other diuretics, initial encounter: Secondary | ICD-10-CM | POA: Diagnosis not present

## 2016-11-24 DIAGNOSIS — I350 Nonrheumatic aortic (valve) stenosis: Secondary | ICD-10-CM | POA: Diagnosis present

## 2016-11-24 DIAGNOSIS — Z7901 Long term (current) use of anticoagulants: Secondary | ICD-10-CM | POA: Diagnosis not present

## 2016-11-24 DIAGNOSIS — M7989 Other specified soft tissue disorders: Secondary | ICD-10-CM

## 2016-11-24 DIAGNOSIS — Z9861 Coronary angioplasty status: Secondary | ICD-10-CM

## 2016-11-24 DIAGNOSIS — N390 Urinary tract infection, site not specified: Secondary | ICD-10-CM | POA: Diagnosis present

## 2016-11-24 DIAGNOSIS — Z888 Allergy status to other drugs, medicaments and biological substances status: Secondary | ICD-10-CM

## 2016-11-24 DIAGNOSIS — Z87891 Personal history of nicotine dependence: Secondary | ICD-10-CM | POA: Diagnosis not present

## 2016-11-24 DIAGNOSIS — Z66 Do not resuscitate: Secondary | ICD-10-CM | POA: Diagnosis not present

## 2016-11-24 DIAGNOSIS — N401 Enlarged prostate with lower urinary tract symptoms: Secondary | ICD-10-CM | POA: Diagnosis not present

## 2016-11-24 DIAGNOSIS — A419 Sepsis, unspecified organism: Secondary | ICD-10-CM | POA: Insufficient documentation

## 2016-11-24 LAB — BASIC METABOLIC PANEL
Anion gap: 9 (ref 5–15)
Anion gap: 8 (ref 5–15)
Anion gap: 8 (ref 5–15)
BUN: 9 mg/dL (ref 6–20)
BUN: 9 mg/dL (ref 6–20)
BUN: 10 mg/dL (ref 6–20)
CHLORIDE: 98 mmol/L — AB (ref 101–111)
CO2: 24 mmol/L (ref 22–32)
CO2: 21 mmol/L — AB (ref 22–32)
CO2: 23 mmol/L (ref 22–32)
CREATININE: 1.01 mg/dL (ref 0.61–1.24)
CREATININE: 1.12 mg/dL (ref 0.61–1.24)
CREATININE: 1.16 mg/dL (ref 0.61–1.24)
Calcium: 8.8 mg/dL — ABNORMAL LOW (ref 8.9–10.3)
Calcium: 8.5 mg/dL — ABNORMAL LOW (ref 8.9–10.3)
Calcium: 8.3 mg/dL — ABNORMAL LOW (ref 8.9–10.3)
Chloride: 95 mmol/L — ABNORMAL LOW (ref 101–111)
Chloride: 96 mmol/L — ABNORMAL LOW (ref 101–111)
GFR calc Af Amer: >60 mL/min (ref >60)
GFR calc Af Amer: >60 mL/min (ref >60)
GFR calc non Af Amer: >60 mL/min (ref >60)
GFR calc non Af Amer: 57 mL/min — ABNORMAL LOW (ref >60)
GFR calc non Af Amer: 55 mL/min — ABNORMAL LOW (ref >60)
GLUCOSE: 141 mg/dL — AB (ref 65–99)
Glucose, Bld: 98 mg/dL (ref 65–99)
Glucose, Bld: 171 mg/dL — ABNORMAL HIGH (ref 65–99)
POTASSIUM: 4.2 mmol/L (ref 3.5–5.1)
Potassium: 4.2 mmol/L (ref 3.5–5.1)
Potassium: 4.4 mmol/L (ref 3.5–5.1)
Sodium: 128 mmol/L — ABNORMAL LOW (ref 135–145)
Sodium: 127 mmol/L — ABNORMAL LOW (ref 135–145)
Sodium: 127 mmol/L — ABNORMAL LOW (ref 135–145)

## 2016-11-24 LAB — CBC WITH DIFFERENTIAL/PLATELET
Basophils Absolute: 0 10*3/uL (ref 0.0–0.1)
Basophils Relative: 0 %
EOS PCT: 1 %
Eosinophils Absolute: 0.1 10*3/uL (ref 0.0–0.7)
HEMATOCRIT: 31.9 % — AB (ref 39.0–52.0)
Hemoglobin: 11.4 g/dL — ABNORMAL LOW (ref 13.0–17.0)
LYMPHS ABS: 0.9 10*3/uL (ref 0.7–4.0)
LYMPHS PCT: 7 %
MCH: 27.5 pg (ref 26.0–34.0)
MCHC: 35.7 g/dL (ref 30.0–36.0)
MCV: 76.9 fL — AB (ref 78.0–100.0)
MONO ABS: 0.7 10*3/uL (ref 0.1–1.0)
Monocytes Relative: 5 %
Neutro Abs: 11.6 10*3/uL — ABNORMAL HIGH (ref 1.7–7.7)
Neutrophils Relative %: 87 %
Platelets: 483 10*3/uL — ABNORMAL HIGH (ref 150–400)
RBC: 4.15 MIL/uL — ABNORMAL LOW (ref 4.22–5.81)
RDW: 14.3 % (ref 11.5–15.5)
WBC: 13.2 10*3/uL — ABNORMAL HIGH (ref 4.0–10.5)

## 2016-11-24 LAB — COMPREHENSIVE METABOLIC PANEL
ALT: 12 U/L — AB (ref 17–63)
AST: 21 U/L (ref 15–41)
Albumin: 3 g/dL — ABNORMAL LOW (ref 3.5–5.0)
Alkaline Phosphatase: 54 U/L (ref 38–126)
Anion gap: 10 (ref 5–15)
BILIRUBIN TOTAL: 0.3 mg/dL (ref 0.3–1.2)
BUN: 12 mg/dL (ref 6–20)
CALCIUM: 8.2 mg/dL — AB (ref 8.9–10.3)
CHLORIDE: 88 mmol/L — AB (ref 101–111)
CO2: 22 mmol/L (ref 22–32)
CREATININE: 0.93 mg/dL (ref 0.61–1.24)
Glucose, Bld: 146 mg/dL — ABNORMAL HIGH (ref 65–99)
Potassium: 3.6 mmol/L (ref 3.5–5.1)
Sodium: 120 mmol/L — ABNORMAL LOW (ref 135–145)
TOTAL PROTEIN: 6 g/dL — AB (ref 6.5–8.1)

## 2016-11-24 LAB — URINALYSIS, ROUTINE W REFLEX MICROSCOPIC
BILIRUBIN URINE: NEGATIVE
GLUCOSE, UA: NEGATIVE mg/dL
Ketones, ur: NEGATIVE mg/dL
Nitrite: NEGATIVE
PH: 7 (ref 5.0–8.0)
Protein, ur: NEGATIVE mg/dL
Specific Gravity, Urine: 1.003 — ABNORMAL LOW (ref 1.005–1.030)

## 2016-11-24 LAB — URINALYSIS, MICROSCOPIC (REFLEX): SQUAMOUS EPITHELIAL / LPF: NONE SEEN

## 2016-11-24 LAB — BRAIN NATRIURETIC PEPTIDE: B NATRIURETIC PEPTIDE 5: 719.5 pg/mL — AB (ref 0.0–100.0)

## 2016-11-24 LAB — MAGNESIUM: Magnesium: 1.4 mg/dL — ABNORMAL LOW (ref 1.7–2.4)

## 2016-11-24 LAB — CBG MONITORING, ED: Glucose-Capillary: 146 mg/dL — ABNORMAL HIGH (ref 65–99)

## 2016-11-24 LAB — TROPONIN I

## 2016-11-24 LAB — TSH: TSH: 7.432 u[IU]/mL — ABNORMAL HIGH (ref 0.350–4.500)

## 2016-11-24 LAB — I-STAT CG4 LACTIC ACID, ED: LACTIC ACID, VENOUS: 1.13 mmol/L (ref 0.5–1.9)

## 2016-11-24 MED ORDER — APIXABAN 5 MG PO TABS
5.0000 mg | ORAL_TABLET | Freq: Two times a day (BID) | ORAL | Status: DC
  Administered 2016-11-24 – 2016-11-26 (×5): 5 mg via ORAL
  Filled 2016-11-24 (×5): qty 1

## 2016-11-24 MED ORDER — TRAMADOL HCL 50 MG PO TABS: 50.0000 mg | ORAL_TABLET | Freq: Four times a day (QID) | ORAL | Status: DC | PRN

## 2016-11-24 MED ORDER — DOCUSATE SODIUM 100 MG PO CAPS
100.0000 mg | ORAL_CAPSULE | Freq: Two times a day (BID) | ORAL | Status: DC
  Administered 2016-11-24 – 2016-11-26 (×5): 100 mg via ORAL
  Filled 2016-11-24 (×5): qty 1

## 2016-11-24 MED ORDER — ACETAMINOPHEN 325 MG PO TABS
325.0000 mg | ORAL_TABLET | Freq: Four times a day (QID) | ORAL | Status: DC | PRN
  Administered 2016-11-24: 325 mg via ORAL
  Filled 2016-11-24: qty 1

## 2016-11-24 MED ORDER — SODIUM CHLORIDE 0.9 % IV SOLN: INTRAVENOUS | Status: DC

## 2016-11-24 MED ORDER — MAGNESIUM CITRATE PO SOLN: 1.0000 | Freq: Once | ORAL | Status: DC | PRN

## 2016-11-24 MED ORDER — AMIODARONE HCL 200 MG PO TABS
200.0000 mg | ORAL_TABLET | Freq: Two times a day (BID) | ORAL | Status: AC
  Administered 2016-11-24 (×2): 200 mg via ORAL
  Filled 2016-11-24 (×2): qty 1

## 2016-11-24 MED ORDER — MECLIZINE HCL 25 MG PO TABS: 25.0000 mg | ORAL_TABLET | Freq: Three times a day (TID) | ORAL | Status: DC | PRN

## 2016-11-24 MED ORDER — ENSURE ENLIVE PO LIQD
237.0000 mL | ORAL | Status: DC
  Administered 2016-11-25: 237 mL via ORAL

## 2016-11-24 MED ORDER — ONDANSETRON HCL 4 MG/2ML IJ SOLN: 4.0000 mg | Freq: Once | INTRAMUSCULAR | Status: DC

## 2016-11-24 MED ORDER — FINASTERIDE 5 MG PO TABS
5.0000 mg | ORAL_TABLET | Freq: Every evening | ORAL | Status: DC
  Administered 2016-11-25: 5 mg via ORAL
  Filled 2016-11-24: qty 1

## 2016-11-24 MED ORDER — DEXTROSE 5 % IV SOLN
1.0000 g | INTRAVENOUS | Status: DC
  Administered 2016-11-25: 1 g via INTRAVENOUS
  Filled 2016-11-24: qty 10

## 2016-11-24 MED ORDER — POTASSIUM CHLORIDE CRYS ER 20 MEQ PO TBCR
40.0000 meq | EXTENDED_RELEASE_TABLET | Freq: Once | ORAL | Status: AC
  Administered 2016-11-24: 40 meq via ORAL
  Filled 2016-11-24: qty 2

## 2016-11-24 MED ORDER — ACETAMINOPHEN 325 MG PO TABS: 650.0000 mg | ORAL_TABLET | Freq: Four times a day (QID) | ORAL | Status: DC | PRN

## 2016-11-24 MED ORDER — SODIUM CHLORIDE 0.9 % IV BOLUS (SEPSIS)
1000.0000 mL | Freq: Once | INTRAVENOUS | Status: AC
Start: 2016-11-24 — End: 2016-11-24
  Administered 2016-11-24: 1000 mL via INTRAVENOUS

## 2016-11-24 MED ORDER — SACCHAROMYCES BOULARDII 250 MG PO CAPS
ORAL_CAPSULE | Freq: Every day | ORAL | Status: DC
  Administered 2016-11-24 – 2016-11-26 (×3): 250 mg via ORAL
  Filled 2016-11-24 (×3): qty 1

## 2016-11-24 MED ORDER — BISACODYL 5 MG PO TBEC: 5.0000 mg | DELAYED_RELEASE_TABLET | Freq: Every day | ORAL | Status: DC | PRN

## 2016-11-24 MED ORDER — HYDROXYZINE HCL 50 MG/ML IM SOLN
25.0000 mg | Freq: Four times a day (QID) | INTRAMUSCULAR | Status: DC | PRN
  Filled 2016-11-24: qty 0.5

## 2016-11-24 MED ORDER — POLYETHYLENE GLYCOL 3350 17 G PO PACK: 17.0000 g | PACK | Freq: Every day | ORAL | Status: DC | PRN

## 2016-11-24 MED ORDER — DILTIAZEM HCL ER COATED BEADS 240 MG PO CP24
240.0000 mg | ORAL_CAPSULE | Freq: Every day | ORAL | Status: DC
  Administered 2016-11-24 – 2016-11-26 (×3): 240 mg via ORAL
  Filled 2016-11-24 (×3): qty 1

## 2016-11-24 MED ORDER — PRAVASTATIN SODIUM 20 MG PO TABS
20.0000 mg | ORAL_TABLET | Freq: Every day | ORAL | Status: DC
  Administered 2016-11-25: 20 mg via ORAL
  Filled 2016-11-24: qty 1

## 2016-11-24 MED ORDER — SODIUM CHLORIDE 0.9 % IV SOLN
INTRAVENOUS | Status: DC
  Administered 2016-11-24: 06:00:00 via INTRAVENOUS

## 2016-11-24 MED ORDER — AMIODARONE HCL 200 MG PO TABS
200.0000 mg | ORAL_TABLET | Freq: Every day | ORAL | Status: DC
  Administered 2016-11-25 – 2016-11-26 (×2): 200 mg via ORAL
  Filled 2016-11-24 (×2): qty 1

## 2016-11-24 MED ORDER — MAGNESIUM SULFATE 2 GM/50ML IV SOLN
2.0000 g | Freq: Once | INTRAVENOUS | Status: AC
  Administered 2016-11-24: 2 g via INTRAVENOUS
  Filled 2016-11-24: qty 50

## 2016-11-24 MED ORDER — DEXTROSE 5 % IV SOLN
1.0000 g | Freq: Once | INTRAVENOUS | Status: AC
  Administered 2016-11-24: 1 g via INTRAVENOUS
  Filled 2016-11-24: qty 10

## 2016-11-24 MED ORDER — PROSIGHT PO TABS
1.0000 | ORAL_TABLET | Freq: Every day | ORAL | Status: DC
  Administered 2016-11-24 – 2016-11-26 (×3): 1 via ORAL
  Filled 2016-11-24 (×4): qty 1

## 2016-11-24 MED ORDER — ADULT MULTIVITAMIN W/MINERALS CH
1.0000 | ORAL_TABLET | Freq: Every day | ORAL | Status: DC
  Administered 2016-11-24 – 2016-11-26 (×2): 1 via ORAL
  Filled 2016-11-24 (×3): qty 1

## 2016-11-24 MED ORDER — ENSURE ENLIVE PO LIQD
237.0000 mL | Freq: Two times a day (BID) | ORAL | Status: DC
  Administered 2016-11-24: 237 mL via ORAL

## 2016-11-24 MED ORDER — SODIUM CHLORIDE 0.9% FLUSH
3.0000 mL | Freq: Two times a day (BID) | INTRAVENOUS | Status: DC
  Administered 2016-11-24 – 2016-11-26 (×5): 3 mL via INTRAVENOUS

## 2016-11-24 MED ORDER — POLYVINYL ALCOHOL 1.4 % OP SOLN
1.0000 [drp] | Freq: Two times a day (BID) | OPHTHALMIC | Status: DC | PRN
  Filled 2016-11-24: qty 15

## 2016-11-24 NOTE — H&P (Addendum)
History and Physical    Justin [name] EYC:[id number] DOB: [birth year] DOA: 11/24/2016  Referring MD/NP/PA: Cyril Mourning Ward PCP: Mosie Lukes, MD  Outpatient Specialists: Dr. Alyson Ingles, Urology Patient coming from: ILF > [facility]  Chief Complaint: Generalized weakness, nausea and vomiting  HPI: Justin [name] is a 83 y.o. male with history of  coronary artery disease status post CABG, atrial fibrillation on amiodarone, diltiazem and Eliquis, hypertension, hyperlipidemia, BPH status post robotic-assisted laparoscopic simple prostatectomy by Dr. Trish Mage 11/04/16, who who presents with generalized weakness, nausea and vomiting.  He was discharged to rehab after his procedure on 5/10 and was walking with PT.  He was discharged to his ILF a weak ago.  For the last 3-4 days he has had worsening weakness that has made ambulation difficult.  He required assistance getting out of the bathtub which was unusual.  He also had worsening tremors/shakes.  He was started on amiodarone two weeks ago for his a-fib.  He also had his urinary catheter removed the day prior to admission on 5/29.  He was able to void in the office but had difficulty voiding at home and was pushing fluids (8-9 glasses of water and coffee). He noticed swelling last night and took HCTZ.  Last night, he passed a large blood clot and was finally able to start voiding afterwards.  Despite starting voiding, he had worsening weakness and felt SOB overnight.  When EMS arrived, he vomited.  Denies fevers. His urine has been mostly clear but intermittent pink tinged since removal of the catheter.  He was taken to [facility].     ED Course: WBC 13.2, urinalysis with TMTC of WBC, sodium 120, magnesium 1.4, temperature normal, tachycardia, oxygen saturation 97% on room air, negative chest x-ray, lactic acid 1.13, negative troponin. Pt has QTc 519. pt is accepted to tele bed as inpt. IV rocephin was started for possible UTI. Magnesium was repleted. 1 L normal  saline was given in ED.  He was started on IVF and given ceftriaxone and admitted to [facility] for further management.    Review of Systems:  General:  Denies fevers, chills, weight loss or gain HEENT:  Denies changes to hearing and vision, rhinorrhea, sinus congestion, sore throat CV:  Denies chest pain and palpitations, positive left  lower extremity edema.  PULM:  Denies SOB, wheezing, cough.   GI:  nausea, vomiting only during transport by EMS.  Denies constipation, diarrhea.   GU:  Voiding more regularly since he passed blood clot yesterday ENDO:  Denies polyuria, polydipsia.   HEME:  Denies hematemesis, blood in stools, melena, abnormal bruising or bleeding.  LYMPH:  Denies lymphadenopathy.   MSK:  Denies arthralgias, myalgias.   DERM:  Denies skin rash or ulcer.   NEURO:  Denies focal numbness, weakness, slurred speech, confusion, facial droop.  PSYCH:  Denies anxiety and depression.    Past Medical History:  Diagnosis Date  . Basal cell carcinoma   . BPH (benign prostatic hypertrophy)   . CAD (coronary artery disease) 1991   a. H/o MI w/ PTCA;  b. s/p CABG by Dr Roxan Hockey w/ LIMA-LAD, SVG-OM1-OM3, SVG-D1, SVG-PDA  . Complication of anesthesia    Wife reports history of confusion after last surgery in 3-17  . Disequilibrium 04/03/2012  . HTN (hypertension)   . Hyperlipidemia   . Internal hemorrhoids   . Medicare annual wellness visit, subsequent 03/16/2015   Follows with cardiology, Dr Stanford Breed, Follows with dermatology, Dr Altamese Cabal. Aged out of colonoscopy  .  Moderate aortic stenosis    a. 08/2014 Echo: EF 55-60%, no rwma, Gr 1 DD, mod AS, mildly dil asc Ao, mild MR, sev dil LA.  [name] PAF (paroxysmal atrial fibrillation) ([place])    a. Dx 08/2014;  b. CHA2DS2VASc = 4 - decision made to withold [place] 2/2 unsteady gait and syncope.  . Sigmoid diverticulitis   . Syncope    a. 08/2014 - ? orthostatic    Past Surgical History:  Procedure Laterality Date  . COLONOSCOPY    .  CORONARY ARTERY BYPASS GRAFT  2000   Dr Roxan Hockey  LIMA-LAD, SVG-OM1-OM3, SVG-D1, SVG-PDA.   [name] HIP ARTHROPLASTY Left 10/14/2015   Procedure: LEFT HIP HEMI ARTHROPLASTY;  Surgeon: [name], MD;  Location: [facility];  Service: Orthopedics;  Laterality: Left;  . SHOULDER SURGERY     x2  . TONSILLECTOMY    . XI ROBOTIC ASSISTED SIMPLE PROSTATECTOMY N/A 11/04/2016   Procedure: XI ROBOTIC ASSISTED SIMPLE PROSTATECTOMY;  Surgeon: Cleon Gustin, MD;  Location: [facility];  Service: Urology;  Laterality: N/A;     reports that he quit smoking about 45 years ago. He has a 7.50 pack-year smoking history. He has never used smokeless tobacco. He reports that he drinks about 0.6 - 1.2 oz of alcohol per week . He reports that he does not use drugs.  Allergies  Allergen Reactions  . Dutasteride Diarrhea    GI upset and incontinence  . Toradol [Ketorolac Tromethamine] Other (See Comments)    CNS-AMS Dizzy and light headed  . Vicodin [Hydrocodone-Acetaminophen] Other (See Comments)    hallucinations    Family History  Problem Relation Age of Onset  . Coronary artery disease Father   . Hyperlipidemia Father   . Heart attack Father   . Alcohol abuse Father   . Bipolar disorder Mother   . Heart disease Mother   . Alcohol abuse Brother   . Cancer Brother   . Cancer Maternal Grandfather        oral cancer, chew tobacco  . Cancer Sister   . Glaucoma Sister   . Alcohol abuse Unknown     Prior to Admission medications   Medication Sig Start Date End Date Taking? Authorizing Provider  acetaminophen (TYLENOL) 325 MG tablet Take 1-2 tablets (325-650 mg total) by mouth every 6 (six) hours as needed for mild pain or moderate pain. Patient taking differently: Take 325 mg by mouth every 6 (six) hours as needed for mild pain or moderate pain.  10/15/15  Yes Loni Dolly, PA-C  amiodarone (PACERONE) 200 MG tablet 1 TABLET TWICE DAILY X 2 WEEKS THEN 1 TABLET ONCE DAILY 11/10/16  Yes Crenshaw, Denice Bors, MD    apixaban (ELIQUIS) 5 MG TABS tablet Take 5 mg by mouth 2 (two) times daily.   Yes [provider]  beta carotene w/minerals (OCUVITE) tablet Take 1 tablet by mouth daily.   Yes [provider]  Carboxymethylcellulose Sodium (LUBRICANT EYE DROPS OP) Apply 1 drop to eye 2 (two) times daily as needed (dry eyes).   Yes [provider]  diltiazem (CARTIA XT) 120 MG 24 hr capsule Take 2 capsules (240 mg total) by mouth daily. 11/07/16  Yes McKenzie, Candee Furbish, MD  finasteride (PROSCAR) 5 MG tablet Take 5 mg by mouth every evening.    Yes [provider]  hydrochlorothiazide (HYDRODIURIL) 25 MG tablet Take 25 mg by mouth daily as needed (fluid).   Yes [provider]  lovastatin (MEVACOR) 20 MG tablet TAKE  ONE TABLET BY MOUTH ONCE DAILY AT BEDTIME 01/20/16  Yes Larey Dresser, MD  meclizine (ANTIVERT) 25 MG tablet Take 1 tablet (25 mg total) by mouth 3 (three) times daily as needed for dizziness. 08/05/16  Yes Fredia Sorrow, MD  Multiple Vitamin (MULTIVITAMIN WITH MINERALS) TABS tablet Take 1 tablet by mouth daily.   Yes [provider]  Probiotic Product (PROBIOTIC DAILY PO) Take 1 capsule by mouth daily.   Yes [provider]  sulfamethoxazole-trimethoprim (BACTRIM DS,SEPTRA DS) 800-160 MG tablet Take 1 tablet by mouth 2 (two) times daily. Start the day prior to foley removal appointment 11/04/16  Yes Dancy, Estill Bamberg, PA-C  Sunscreens (AVEENO ESSENTIAL MOISTURE EX) Apply 1 application topically daily.   Yes [provider]  traMADol (ULTRAM) 50 MG tablet Take 1 tablet (50 mg total) by mouth every 6 (six) hours as needed for severe pain. 11/07/16   Cleon Gustin, MD    Physical Exam: Vitals:   11/24/16 0300 11/24/16 0330 11/24/16 0400 11/24/16 0430  BP: 126/88 116/83 131/73 131/84  Pulse: 93 85 84 80  Resp: 15 15 13  (!) 22  Temp:      TempSrc:      SpO2: 99% 98% 97% 94%  Weight:      Height:       Constitutional: NAD,  calm, comfortable, hard of hearing Eyes: PERRL, lids and conjunctivae normal ENMT: Mucous membranes are moist. Posterior pharynx clear of any exudate or lesions.Normal dentition.  Neck: normal, supple, no masses, no thyromegaly Respiratory: clear to auscultation bilaterally, no wheezing, no crackles. Normal respiratory effort. No accessory muscle use.  Cardiovascular: IRRR, rate in 80s.  2+ pitting left lower extremity edema. 2+ pedal pulses. No carotid bruits.  Abdomen: no tenderness, no masses palpated. No hepatosplenomegaly. Bowel sounds positive.  Musculoskeletal: no clubbing / cyanosis. No joint deformity upper and lower extremities. Good ROM, no contractures. Normal muscle tone.  Skin: no rashes, lesions, ulcers. No induration Neurologic: CN 2-12 grossly intact. Sensation intact, DTR normal. Strength 5/5 in all 4.  Psychiatric: Normal judgment and insight. Alert and oriented x 3 but has subtle memory problems on interview and defers to his wife. Normal mood.   Labs on Admission: I have personally reviewed following labs and imaging studies  CBC:  Recent Labs Lab 11/24/16 0139  WBC 13.2*  NEUTROABS 11.6*  HGB 11.4*  HCT 31.9*  MCV 76.9*  PLT 696*   Basic Metabolic Panel:  Recent Labs Lab 11/24/16 0139  NA 120*  K 3.6  CL 88*  CO2 22  GLUCOSE 146*  BUN 12  CREATININE 0.93  CALCIUM 8.2*  MG 1.4*   GFR: Estimated Creatinine Clearance: 49.6 mL/min (by C-G formula based on SCr of 0.93 mg/dL). Liver Function Tests:  Recent Labs Lab 11/24/16 0139  AST 21  ALT 12*  ALKPHOS 54  BILITOT 0.3  PROT 6.0*  ALBUMIN 3.0*   No results for input(s): LIPASE, AMYLASE in the last 168 hours. No results for input(s): AMMONIA in the last 168 hours. Coagulation Profile: No results for input(s): INR, PROTIME in the last 168 hours. Cardiac Enzymes:  Recent Labs Lab 11/24/16 0139  TROPONINI <0.03   BNP (last 3 results) No results for input(s): PROBNP in the last 8760  hours. HbA1C: No results for input(s): HGBA1C in the last 72 hours. CBG:  Recent Labs Lab 11/24/16 0149  GLUCAP 146*   Lipid Profile: No results for input(s): CHOL, HDL, LDLCALC, TRIG, CHOLHDL, LDLDIRECT in the last  72 hours. Thyroid Function Tests: No results for input(s): TSH, T4TOTAL, FREET4, T3FREE, THYROIDAB in the last 72 hours. Anemia Panel: No results for input(s): VITAMINB12, FOLATE, FERRITIN, TIBC, IRON, RETICCTPCT in the last 72 hours. Urine analysis:    Component Value Date/Time   COLORURINE YELLOW 11/24/2016 0420   APPEARANCEUR CLEAR 11/24/2016 0420   LABSPEC 1.003 (L) 11/24/2016 0420   PHURINE 7.0 11/24/2016 0420   GLUCOSEU NEGATIVE 11/24/2016 0420   GLUCOSEU NEG mg/dL 02/16/2010 0000   HGBUR LARGE (A) 11/24/2016 0420   BILIRUBINUR NEGATIVE 11/24/2016 0420   BILIRUBINUR negative 03/23/2016 1005   KETONESUR NEGATIVE 11/24/2016 0420   PROTEINUR NEGATIVE 11/24/2016 0420   UROBILINOGEN negative 03/23/2016 1005   UROBILINOGEN 0.2 09/13/2014 1634   NITRITE NEGATIVE 11/24/2016 0420   LEUKOCYTESUR SMALL (A) 11/24/2016 0420   Sepsis Labs: @LABRCNTIP (procalcitonin:4,lacticidven:4) )No results found for this or any previous visit (from the past 240 hour(s)).   Radiological Exams on Admission: Dg Chest Port 1 View  Result Date: 11/24/2016 CLINICAL DATA:  83 year old male with generalized weakness and shortness of breath. EXAM: PORTABLE CHEST 1 VIEW COMPARISON:  Chest radiograph dated 08/15/2016 FINDINGS: There is diffuse chronic interstitial coarsening. Left lung base linear atelectasis/scarring. There is no focal consolidation, pleural effusion, or pneumothorax. The cardiac silhouette is within normal limits. Median sternotomy wires, CABG vascular clips and LIMA bypass graft noted. Multiple calcified mediastinal and hilar lymph nodes again seen. No acute osseous pathology. There degenerative changes of the shoulders. IMPRESSION: No active disease. Electronically Signed    By: Anner Crete M.D.   On: 11/24/2016 02:44    EKG: Independently reviewed.  Atrial fibrillation, rate 104, no ST segment elevations or depressions. Prolonged QTc 519  Assessment/Plan Principal Problem:   Hyponatremia Active Problems:   Essential hypertension   CAD, ARTERY BYPASS GRAFT   Aortic stenosis   Hyperglycemia   Paroxysmal atrial fibrillation (HCC)   UTI (urinary tract infection)   Generalized weakness   Generalized weakness, possibly due to UTI and exacerbated by hyponatremia due to urinary obstruction and HCTZ use.   -  TSH -  PT/OT evaluation -  OOB with assistance  Possible UTI, patient does NOT meet sepsis criteria.  Catheter associated due to indwelling catheter that was removed yesterday in office, present at time of admission.  WBC rapidly clearing as hematuria/blood clot resolving on repeat UA. -  Ceftriaxone -  F/u urine culture  Hyponatremia, likely due to urinary obstruction and HCTZ use -  q4h BMP until starting to trend up -  D/c HCTZ -  Free water restriction -  Urinary obstruction resolved spontaneously, but strict I/O and early intervention if recurs  Prolonged QTC with hypomagnesemia -  Magnesium 4gm once -  Minimize QTc prolonging medications  Paroxysmal atrial fibrillation, CHADs2vasc = 4 -  Continue eliquis -  Continue dilt  -  Decrease amiodarone 5/31 per schedule   CAD status post CABG, chest pain free -  Continue eliquis and CCB and statin  Acute on chronic systolic congestive heart failure with EF 40-45%, diffuse hypokinesis and mild to moderate aortic valve stenosis, swelling of lower extremity and SOB  -  D/c IVF -  Consider lasix, but already improving with relief of bladder obstruction -  Duplex LLE  Essential hypertension, blood pressures stable on CCB  Mild leukocytosis and thrombocytosis possibly due to urinary obstruction vs. UTI -  Repeat CBC in AM  Microcytic anemia -  Iron studies, B12, folate -  TSH -  Occult  stool -  Repeat hgb in AM  DVT prophylaxis: eliquis  Code Status: DNR Family Communication: patient, wife at bedside.  Questions answered, plan reviewed  Disposition Plan:  Unclear. PT/OT pending.  Discharge in 2-3 days  Consults called: none  Admission status:  Inpatient due to multiple comorbidities:  CAD, aortic valve stenosis, a-fib on anticoagulation, and being admitted with symptomatic hyponatremia and possible UTI.  Moderate to high risk of decompensation.     Janece Canterbury MD [facility] Pager [phone]  If 7PM-7AM, please contact night-coverage [email or web address] Password [email or web address]  11/24/2016, 8:55 AM

## 2016-11-24 NOTE — Progress Notes (Signed)
PHARMACY NOTE -  ANTIBIOTIC RENAL DOSE ADJUSTMENT   Request received for Pharmacy to assist with antibiotic renal dose adjustment.  Patient has been initiated on Ceftriaxone for UTI. SCr 0.93, estimated CrCl ~ 50 ml/min Current dosage of 1gm q24 hr is appropriate and need for further dosage adjustment appears unlikely at present - Ceftriaxone not renally excreted, needs no renal adjustment Will sign off at this time.  Please reconsult if a change in clinical status warrants re-evaluation of dosage.  Minda Ditto PharmD Pager [phone] 11/24/2016, 8:24 AM

## 2016-11-24 NOTE — ED Triage Notes (Addendum)
Pt c/o leg weakness x 1 hour w shaking some dizziness,  Family members states did vomit small amount on way to ed,   Started on new med last pm  Had cather removed Tuesday at 1100 am

## 2016-11-24 NOTE — Progress Notes (Signed)
**  Preliminary report by tech**  Left lower extremity venous duplex complete. There is no evidence of deep or superficial vein thrombosis involving the left lower extremity. All visualized vessels appear patent and compressible. There is no evidence of a Baker's cyst on the left.  11/24/16 11:25 AM [name] RVT

## 2016-11-24 NOTE — ED Provider Notes (Addendum)
TIME SEEN: 1:25 AM  CHIEF COMPLAINT: Generalized weakness  HPI: Patient is an 83 year old male with history of coronary artery disease status post CABG, atrial fibrillation on amiodarone, diltiazem and Eliquis, hypertension, hyperlipidemia, BPH status post robotic-assisted laparoscopic simple prostatectomy by Dr. Alyson Ingles on 11/04/16 who presents to the emergency department with complaints of generalized weakness that started yesterday. Wife reports that they live in independent living at [facility]. They called the nursing staff because he was getting weaker throughout the day. He was just seen at 11 AM yesterday morning and had his Foley catheter removed. He has been urinating normally since. He denies any fevers but has had chills. Had nausea and vomiting today. No diarrhea. Has been having normal bowel movements. Recently restarted his eloquent for his atrial fibrillation. He has no chest pain or shortness of breath currently but wife reports that he was complaining of feeling short of breath earlier this evening. No numbness, tingling or focal weakness. No headache, head injury.   Wife reports he was just started on amiodarone 2 weeks ago and started on finasteride yesterday by his urologist. She is wondering if the interaction of these 2 medications could be causing his symptoms today. She also reports that he has been on Cipro for the past 2 days prior to having his Foley catheter removed.   ROS: See HPI Constitutional: no fever  Eyes: no drainage  ENT: no runny nose   Cardiovascular:  no chest pain  Resp: Shortness of breath that has resolved GI:  vomiting GU: no dysuria Integumentary: no rash  Allergy: no hives  Musculoskeletal: no leg swelling  Neurological: no slurred speech ROS otherwise negative  PAST MEDICAL HISTORY/PAST SURGICAL HISTORY:  Past Medical History:  Diagnosis Date  . Basal cell carcinoma   . BPH (benign prostatic hypertrophy)   . CAD (coronary artery disease)  1991   a. H/o MI w/ PTCA;  b. s/p CABG by Dr Roxan Hockey w/ LIMA-LAD, SVG-OM1-OM3, SVG-D1, SVG-PDA  . Complication of anesthesia    Wife reports history of confusion after last surgery in 3-17  . Disequilibrium 04/03/2012  . HTN (hypertension)   . Hyperlipidemia   . Internal hemorrhoids   . Medicare annual wellness visit, subsequent 03/16/2015   Follows with cardiology, Dr Stanford Breed, Follows with dermatology, Dr Altamese Cabal. Aged out of colonoscopy  . Moderate aortic stenosis    a. 08/2014 Echo: EF 55-60%, no rwma, Gr 1 DD, mod AS, mildly dil asc Ao, mild MR, sev dil LA.  [name] PAF (paroxysmal atrial fibrillation) ([place])    a. Dx 08/2014;  b. CHA2DS2VASc = 4 - decision made to withold [place] 2/2 unsteady gait and syncope.  . Sigmoid diverticulitis   . Syncope    a. 08/2014 - ? orthostatic    MEDICATIONS:  Prior to Admission medications   Medication Sig Start Date End Date Taking? Authorizing Provider  acetaminophen (TYLENOL) 325 MG tablet Take 1-2 tablets (325-650 mg total) by mouth every 6 (six) hours as needed for mild pain or moderate pain. Patient taking differently: Take 325 mg by mouth every 6 (six) hours as needed for mild pain or moderate pain.  10/15/15   Loni Dolly, PA-C  amiodarone (PACERONE) 200 MG tablet 1 TABLET TWICE DAILY X 2 WEEKS THEN 1 TABLET ONCE DAILY 11/10/16   Lelon Perla, MD  Carboxymethylcellulose Sodium (LUBRICANT EYE DROPS OP) Apply 1 drop to eye 2 (two) times daily as needed (dry eyes).    [provider]  diltiazem (  CARTIA XT) 120 MG 24 hr capsule Take 2 capsules (240 mg total) by mouth daily. 11/07/16   McKenzie, Candee Furbish, MD  finasteride (PROSCAR) 5 MG tablet Take 5 mg by mouth every evening.     [provider]  lovastatin (MEVACOR) 20 MG tablet TAKE ONE TABLET BY MOUTH ONCE DAILY AT BEDTIME 01/20/16   Larey Dresser, MD  meclizine (ANTIVERT) 25 MG tablet Take 1 tablet (25 mg total) by mouth 3 (three) times daily as needed for dizziness. 08/05/16    Fredia Sorrow, MD  Probiotic Product (PROBIOTIC DAILY PO) Take 1 capsule by mouth daily.    [provider]  sulfamethoxazole-trimethoprim (BACTRIM DS,SEPTRA DS) 800-160 MG tablet Take 1 tablet by mouth 2 (two) times daily. Start the day prior to foley removal appointment 11/04/16   Debbrah Alar, PA-C  Sunscreens (AVEENO ESSENTIAL MOISTURE EX) Apply 1 application topically daily.    [provider]  traMADol (ULTRAM) 50 MG tablet Take 1 tablet (50 mg total) by mouth every 6 (six) hours as needed for severe pain. 11/07/16   McKenzie, Candee Furbish, MD    ALLERGIES:  Allergies  Allergen Reactions  . Dutasteride Diarrhea    GI upset and incontinence  . Toradol [Ketorolac Tromethamine] Other (See Comments)    CNS-AMS Dizzy and light headed    SOCIAL HISTORY:  Social History  Substance Use Topics  . Smoking status: Former Smoker    Packs/day: 0.50    Years: 15.00    Quit date: 06/29/1971  . Smokeless tobacco: Never Used  . Alcohol use 0.6 - 1.2 oz/week    1 - 2 Glasses of wine per week     Comment: alternates wine and liquor. 1/2 pint liquor/week    FAMILY HISTORY: Family History  Problem Relation Age of Onset  . Coronary artery disease Father   . Hyperlipidemia Father   . Heart attack Father   . Alcohol abuse Father   . Bipolar disorder Mother   . Heart disease Mother   . Alcohol abuse Brother   . Cancer Brother   . Cancer Maternal Grandfather        oral cancer, chew tobacco  . Cancer Sister   . Glaucoma Sister   . Alcohol abuse Unknown     EXAM: BP (!) 144/103 (BP Location: Right Arm)   Pulse (!) 108   Temp 97.8 F (36.6 C) (Oral)   Resp 20   Ht 5\' 5"  (1.651 m)   Wt 63 kg (139 lb)   SpO2 97%   BMI 23.13 kg/m  CONSTITUTIONAL: Alert and oriented 3 and responds appropriately to questions. Elderly, afebrile, nontoxic HEAD: Normocephalic EYES: Conjunctivae clear, pupils appear equal, EOMI ENT: normal nose; moist mucous membranes NECK: Supple, no  meningismus, no nuchal rigidity, no LAD  CARD: Tachycardic; Irregularly irregular, S1 and S2 appreciated; no murmurs, no clicks, no rubs, no gallops RESP: Normal chest excursion without splinting or tachypnea; breath sounds clear and equal bilaterally; no wheezes, no rhonchi, no rales, no hypoxia or respiratory distress, speaking full sentences ABD/GI: Normal bowel sounds; non-distended; soft, non-tender, no rebound, no guarding, no peritoneal signs, no hepatosplenomegaly, multiple laparoscopic surgical scars that are intact, clean and dry without bleeding or drainage BACK:  The back appears normal and is non-tender to palpation, there is no CVA tenderness EXT: Normal ROM in all joints; non-tender to palpation; no edema; normal capillary refill; no cyanosis, no calf tenderness or swelling    SKIN: Normal color for age and  race; warm; no rash NEURO: Moves all extremities equally, sensation to light touch intact diffusely, no pronator drift, normal speech, cranial nerves II through XII intact PSYCH: The patient's mood and manner are appropriate. Grooming and personal hygiene are appropriate.  MEDICAL DECISION MAKING: Patient here with complaints of generalized weakness. No focal neurologic deficits on exam. Rectal temperature is 98.5. Abdomen is soft and nontender. No sign of bleeding or any drainage from any of his recent laparoscopic surgical scars. Had his Foley catheter removed yesterday. Reports he has been able to urinate on his own. Differential diagnosis includes UTI, dehydration, anemia, electrolyte abnormality. Wife reports he was doing well after his surgery and able to walk up and down the street using a walker until today. Had a brief episode where he complained of shortness of breath but denies chest pain or shortness of breath currently. We'll obtain cardiac labs, chest x-ray. Patient had vomiting with EMS. Reports still feeling nauseated. We'll give IV Zofran. We'll give IV fluids as well.  Labs, urine, chest x-ray pending.  ED PROGRESS: 1:53 AM  Pt's EKG shows prolonged QT interval at 519 ms. He is in atrial fibrillation. We'll hold on giving any medications that would prolong his QT interval further including Zofran. Will check magnesium level.   3:00 AM  Pt's labs show mild leukocytosis with left shift. Urine shows too numerous to count white blood cells, red blood cells and few bacteria. Culture is pending. Patient's sodium is low in the 120. He appears dry on exam and not volume overloaded. Will continue gentle hydration. Potassium is 3.6 which we will optimize. Magnesium is 1.4. We'll give oral potassium replacement and IV magnesium replacement. Troponin negative. Lactate normal. We'll give Rocephin for his UTI. Chest x-ray is clear. We'll discuss with hospitalist at [facility] for admission. Patient and family comfortable with this plan.  3:25 AM Discussed patient's case with hospitalist, Dr. Blaine Hamper.  I have recommended admission and patient (and family if present) agree with this plan. Admitting physician will place admission orders.   I reviewed all nursing notes, vitals, pertinent previous records, EKGs, lab and urine results, imaging (as available).    EKG Interpretation  Date/Time:  Wednesday Nov 24 2016 01:44:37 EDT Ventricular Rate:  104 PR Interval:    QRS Duration: 104 QT Interval:  398 QTC Calculation: 519 R Axis:   -47 Text Interpretation:  Atrial fibrillation Left anterior fascicular block Anteroseptal infarct, old Prolonged QT interval Confirmed by Pryor Curia [phone]) on 11/24/2016 1:52:49 AM         CRITICAL CARE Performed by: Nyra Jabs   Total critical care time: 45 minutes  Critical care time was exclusive of separately billable procedures and treating other patients.  Patient has hyponatremia requiring IV hydration and admission.  Critical care was necessary to treat or prevent imminent or life-threatening  deterioration.  Critical care was time spent personally by me on the following activities: development of treatment plan with patient and/or surrogate as well as nursing, discussions with consultants, evaluation of patient's response to treatment, examination of patient, obtaining history from patient or surrogate, ordering and performing treatments and interventions, ordering and review of laboratory studies, ordering and review of radiographic studies, pulse oximetry and re-evaluation of patient's condition.     Ward, Delice Bison, DO 11/24/16 0326     5:20 AM  A repeat urinalysis was obtained as his urine has cleared with IV hydration. It still shows large hemoglobin was 6-30 RBCs, small leukocytes with  6-30 white blood cells and rare bacteria. We'll also send urine culture from the sample as well.   Ward, Delice Bison, DO 11/24/16 [phone]

## 2016-11-24 NOTE — Progress Notes (Signed)
Initial Nutrition Assessment  DOCUMENTATION CODES:   Not applicable  INTERVENTION:  - Will decrease Ensure Enlive to once/day, this supplement provides 350 kcal and 20 grams of protein - Continue to encourage PO intakes of meals and supplement. - RD will continue to monitor for additional nutrition-related needs.  NUTRITION DIAGNOSIS:   Inadequate oral intake related to acute illness, poor appetite as evidenced by per patient/family report.  GOAL:   Patient will meet greater than or equal to 90% of their needs  MONITOR:   PO intake, Supplement acceptance, Weight trends, Labs  REASON FOR ASSESSMENT:   Malnutrition Screening Tool  ASSESSMENT:   83 y.o. male with history of  coronary artery disease status post CABG, afib, HTN, hyperlipidemia, BPH status post robotic-assisted laparoscopic simple prostatectomy on 11/04/16, who presents with generalized weakness, nausea and vomiting.  For the last 3-4 days he has had worsening weakness that has made ambulation difficult.  He also had worsening tremors/shakes.  He had difficulty voiding at home and was pushing fluids (8-9 glasses of water and coffee). He noticed swelling last night and took HCTZ.  Last night, he passed a large blood clot and was able to start voiding afterwards. He had worsening weakness and felt SOB overnight.  When EMS arrived, he vomited.    Pt seen for MST. BMI indicates normal weight status. No intakes documented since admission. Pt reports that he ate breakfast but unable to state what or how much he ate. He did not yet have lunch and plans to order something around 1630; two meals today. Pt states that PTA his intakes varied and the number of meals he ate per day varied. Pt seemed to have some slight confusion during discussion which may have been d/t needing to be awakened at time of RD visit. No family/visitors present. Pt denies N/V PTA (despite notes stating vomiting at time of EMS arrival) or currently. He also  denies any abdominal pain.  Physical assessment unable to be done d/t pt request to be allowed to sleep; will attempt at follow-up. Per chart review, weight has fluctuated (139-146 lbs) over the past 4 months. Unable to state malnutrition at this time but will update if it is found that pt meets criteria once physical assessment is able to be performed.   Medications reviewed; 1 tablet Ocuvite/day, 100 mg Colace BID, 2 g IV Mg sulfate x2 runs today, daily multivitamin with minerals, 40 mEq oral KCl x1 dose today, 1 capsule Florastor/day. Labs reviewed; CBG: 146 mg/dL today, Na: 128 mmol/L, Cl: 95 mmol/L, Ca: 8.8 mg/dL.     Diet Order:  Diet Heart Room service appropriate? Yes; Fluid consistency: Thin; Fluid restriction: 1200 mL Fluid  Skin:  Reviewed, no issues  Last BM:  5/29  Height:   Ht Readings from Last 1 Encounters:  11/24/16 5\' 5"  (1.651 m)    Weight:   Wt Readings from Last 1 Encounters:  11/24/16 139 lb (63 kg)    Ideal Body Weight:  61.82 kg  BMI:  Body mass index is 23.13 kg/m.  Estimated Nutritional Needs:   Kcal:  1450-1640 (23-26 kcal/kg)  Protein:  55-65 grams  Fluid:  1.2-1.4 L/day  EDUCATION NEEDS:   No education needs identified at this time    Jarome Matin, MS, RD, LDN, CNSC Inpatient Clinical Dietitian Pager # [phone] After hours/weekend pager # [phone]

## 2016-11-24 NOTE — Progress Notes (Signed)
This is a no charge note  Transfer from [facility] per Dr. Leonides Schanz  83 year old man with past medical history of coronary artery disease status post CABG, atrial fibrillation on amiodarone, diltiazem and Eliquis, hypertension, hyperlipidemia, BPH status post robotic-assisted laparoscopic simple prostatectomy by Dr. Alyson Ingles on 11/04/16, who who presents with generalized weakness, nausea and vomiting. He denies any fevers, but has had chills. No numbness, tingling or focal weakness.   Patient was found to have WBC 13.2, urinalysis with TMTC of WBC, sodium 120, magnesium 1.4, temperature normal, tachycardia, oxygen saturation 97% on room air, negative chest x-ray, lactic acid 1.13, negative troponin. Pt has QTc 519. pt is accepted to tele bed as inpt. IV rocephin was started for possible UTI. Magnesium was repleted. 1 L normal saline was given in ED.  Ivor Costa, MD  [facility] Pager [phone]  If 7PM-7AM, please contact night-coverage [email or web address] Password [email or web address] 11/24/2016, 3:45 AM

## 2016-11-25 DIAGNOSIS — I10 Essential (primary) hypertension: Secondary | ICD-10-CM

## 2016-11-25 DIAGNOSIS — R531 Weakness: Secondary | ICD-10-CM

## 2016-11-25 DIAGNOSIS — E871 Hypo-osmolality and hyponatremia: Principal | ICD-10-CM

## 2016-11-25 DIAGNOSIS — I480 Paroxysmal atrial fibrillation: Secondary | ICD-10-CM

## 2016-11-25 LAB — CBC
HEMATOCRIT: 34.9 % — AB (ref 39.0–52.0)
HEMOGLOBIN: 12.1 g/dL — AB (ref 13.0–17.0)
MCH: 27.3 pg (ref 26.0–34.0)
MCHC: 34.7 g/dL (ref 30.0–36.0)
MCV: 78.8 fL (ref 78.0–100.0)
Platelets: 471 10*3/uL — ABNORMAL HIGH (ref 150–400)
RBC: 4.43 MIL/uL (ref 4.22–5.81)
RDW: 14.6 % (ref 11.5–15.5)
WBC: 7.4 10*3/uL (ref 4.0–10.5)

## 2016-11-25 LAB — IRON AND TIBC
IRON: 35 ug/dL — AB (ref 45–182)
Saturation Ratios: 14 % — ABNORMAL LOW (ref 17.9–39.5)
TIBC: 248 ug/dL — ABNORMAL LOW (ref 250–450)
UIBC: 213 ug/dL

## 2016-11-25 LAB — BASIC METABOLIC PANEL
ANION GAP: 7 (ref 5–15)
BUN: 9 mg/dL (ref 6–20)
CHLORIDE: 101 mmol/L (ref 101–111)
CO2: 25 mmol/L (ref 22–32)
Calcium: 8.4 mg/dL — ABNORMAL LOW (ref 8.9–10.3)
Creatinine, Ser: 1.09 mg/dL (ref 0.61–1.24)
GFR calc non Af Amer: 59 mL/min — ABNORMAL LOW (ref >60)
GLUCOSE: 102 mg/dL — AB (ref 65–99)
POTASSIUM: 4.5 mmol/L (ref 3.5–5.1)
Sodium: 133 mmol/L — ABNORMAL LOW (ref 135–145)

## 2016-11-25 LAB — FOLATE: Folate: 24 ng/mL (ref >5.9)

## 2016-11-25 LAB — VITAMIN B12: VITAMIN B 12: 741 pg/mL (ref 180–914)

## 2016-11-25 LAB — URINE CULTURE: CULTURE: NO GROWTH

## 2016-11-25 LAB — FERRITIN: FERRITIN: 155 ng/mL (ref 24–336)

## 2016-11-25 MED ORDER — FERROUS SULFATE 325 (65 FE) MG PO TABS
325.0000 mg | ORAL_TABLET | Freq: Two times a day (BID) | ORAL | Status: DC
  Administered 2016-11-25 – 2016-11-26 (×2): 325 mg via ORAL
  Filled 2016-11-25 (×2): qty 1

## 2016-11-25 NOTE — Evaluation (Signed)
Physical Therapy Evaluation Patient Details Name: Justin [name] MRN: [id number] DOB: 1928-10-15 Today's Date: 11/25/2016   History of Present Illness  83 y.o. male with history of  coronary artery disease status post CABG, atrial fibrillation on amiodarone, diltiazem and Eliquis, hypertension, hyperlipidemia, BPH status post robotic-assisted laparoscopic simple prostatectomy by Dr. Alyson Ingles on 11/04/16, who who presents with generalized weakness, nausea and vomiting.  He was discharged to rehab after his procedure on 5/10 and was walking with PT.  He was discharged to his ILF a week ago.  For the last 3-4 days he has had worsening weakness that has made ambulation difficult.  He required assistance getting out of the bathtub which was unusual.    Clinical Impression  Pt admitted with above diagnosis. Pt currently with functional limitations due to the deficits listed below (see PT Problem List). Pt ambulated 400' with RW. He required ome verbal cues for safety with transfers. HHPT recommended.  Pt will benefit from skilled PT to increase their independence and safety with mobility to allow discharge to the venue listed below.       Follow Up Recommendations Home health PT;Supervision for mobility/OOB    Equipment Recommendations  None recommended by PT    Recommendations for Other Services       Precautions / Restrictions Precautions Precautions: Fall Precaution Comments: most recent fall was ~13 months ago during a balance class, pt had L hip fracture Restrictions Weight Bearing Restrictions: No      Mobility  Bed Mobility               General bed mobility comments: pt up in recliner on arrival  Transfers Overall transfer level: Needs assistance Equipment used: Rolling walker (2 wheeled) Transfers: Sit to/from Stand Sit to Stand: Min guard         General transfer comment: verbal cues for hand placement and safe technique, attempted to sit prior to fully backing up  to recliner  Ambulation/Gait Ambulation/Gait assistance: Min guard Ambulation Distance (Feet): 400 Feet Assistive device: Rolling walker (2 wheeled) Gait Pattern/deviations: Step-through pattern;Decreased stride length;Decreased step length - right;Decreased step length - left   Gait velocity interpretation: at or above normal speed for age/gender General Gait Details: HR 107 max with walking, decr step length bilaterally, this fluctuated during walk, no LOB  Stairs            Wheelchair Mobility    Modified Rankin (Stroke Patients Only)       Balance Overall balance assessment: History of Falls;Needs assistance         Standing balance support: Bilateral upper extremity supported Standing balance-Leahy Scale: Poor Standing balance comment: requiring bil UE support today               High Level Balance Comments: hx of fall during a balance class resulting in fx s/p R hip hemiarthroplasty, pt had outpatient PT for balance and gait training end of last year(2017)             Pertinent Vitals/Pain Pain Assessment: No/denies pain    Home Living Family/patient expects to be discharged to:: Private residence Living Arrangements: Spouse/significant other   Type of Home: Independent living facility Home Access: Level entry     Home Layout: One level Home Equipment: Environmental consultant - 2 wheels;Kasandra Knudsen - single point Additional Comments: from [employer]    Prior Function Level of Independence: Independent with assistive device(s)         Comments: uses SPC inside, RW  outside, takes exercise classes; stopped driving due to memory issues, wife drives     Hand Dominance        Extremity/Trunk Assessment   Upper Extremity Assessment Upper Extremity Assessment: Overall WFL for tasks assessed    Lower Extremity Assessment Lower Extremity Assessment: Overall WFL for tasks assessed    Cervical / Trunk Assessment Cervical / Trunk Assessment: Kyphotic   Communication   Communication: No difficulties  Cognition Arousal/Alertness: Awake/alert Behavior During Therapy: WFL for tasks assessed/performed Overall Cognitive Status: Within Functional Limits for tasks assessed                                        General Comments      Exercises     Assessment/Plan    PT Assessment Patient needs continued PT services  PT Problem List Decreased strength;Decreased mobility;Decreased safety awareness;Decreased activity tolerance;Decreased knowledge of use of DME       PT Treatment Interventions Gait training;DME instruction;Therapeutic activities;Therapeutic exercise;Functional mobility training;Patient/family education;Balance training    PT Goals (Current goals can be found in the Care Plan section)  Acute Rehab PT Goals Patient Stated Goal: play golf, improve endurance PT Goal Formulation: With patient Time For Goal Achievement: 12/09/16 Potential to Achieve Goals: Good    Frequency Min 3X/week   Barriers to discharge        Co-evaluation               AM-PAC PT "6 Clicks" Daily Activity  Outcome Measure Difficulty turning over in bed (including adjusting bedclothes, sheets and blankets)?: None Difficulty moving from lying on back to sitting on the side of the bed? : A Little Difficulty sitting down on and standing up from a chair with arms (e.g., wheelchair, bedside commode, etc,.)?: A Little Help needed moving to and from a bed to chair (including a wheelchair)?: A Little Help needed walking in hospital room?: A Little Help needed climbing 3-5 steps with a railing? : A Little 6 Click Score: 19    End of Session Equipment Utilized During Treatment: Gait belt Activity Tolerance: Patient tolerated treatment well Patient left: in chair;with call bell/phone within reach Nurse Communication: Mobility status PT Visit Diagnosis: Difficulty in walking, not elsewhere classified (R26.2)    Time:  8938-1017 PT Time Calculation (min) (ACUTE ONLY): 17 min   Charges:   PT Evaluation $PT Eval Low Complexity: 1 Procedure     PT G CodesPhilomena Doheny 11/25/2016, 12:04 PM [phone]

## 2016-11-25 NOTE — Progress Notes (Signed)
[name] for HHPT-fax#[phone].

## 2016-11-25 NOTE — Care Management Note (Signed)
Case Management Note  Patient Details  Name: Justin [name] MRN: [id number] Date of Birth: April 07, 1929  Subjective/Objective: Received call from patient's spouse about HHC-patient stays @ [name]-they have their own HHPT. I will fax HHPT orders to [name]. [facility] notified of not to provide HHPT.                   Action/Plan:d/c home w/HHC.   Expected Discharge Date:                  Expected Discharge Plan:  [place]  In-House Referral:     Discharge planning Services  CM Consult  Post Acute Care Choice:    Choice offered to:  Patient  DME Arranged:    DME Agency:     HH Arranged:  PT HH Agency:     Status of Service:  In process, will continue to follow  If discussed at Long Length of Stay Meetings, dates discussed:    Additional Comments:  Dessa Phi, RN 11/25/2016, 3:46 PM

## 2016-11-25 NOTE — Progress Notes (Signed)
PROGRESS NOTE [facility]   SULO JANCZAK   DGL:[id number] DOB: 12-02-1928  DOA: 11/24/2016 PCP: Mosie Lukes, MD   Brief Narrative:  83 year old male with history of CAD, A. fib, hypertension, BPH status post robotic-assisted laparoscopic prostatectomy on 11/04/2016 presented to the emergency department complaining of generalized weakness nausea and vomiting. Patient urine catheter removed prior to admission on 11/23/2016, but he has been having difficulty with urination since then. Prior to coming to the emergency room he passed large blood clot and subsequently started voiding without any issues. In the ED his UA was grossly abnormal they placed a Foley catheter started on  IV antibiotic for possible UTI. Patient was found to have QTC prolongation 519 and sodium of 120. Patient admitted for treatment of symptomatic hyponatremia and possible UTI.  Subjective: Patient seen and examined, he reported his back to baseline. No complaints today. Patient with Foley catheter in place urine yellow and diuresing well. Sodium up to 133.  Assessment & Plan: Generalized weakness due to hyponatremia  See below  PT recommending HH PT   Hyponatremia - improved  Likely from HCTZ and urine retention  Patient feel back to baseline  Hold HCTZ  On water restriction  Monitor BMP in AM   Abnormal UA both no bacterial growth and urine likely to be UTI Leukocytosis - likely reactive - resolved  On Rocephin will d/c given no growth on urine cultures   S/p prostatectomy - 11/04/16 Foley placed in the ED - will discuss with Urology if need to be d/c with foley in place   Prolong QTc Mag given  Repeat EKG in AM   Paroxysmal atrial fibrillation - remains in A. fib heart rate well controlled Patient on Eliquis  On Dilt and amiodarone  Amio dose decrease per schedule  Continue to monitor   Essential hypertension - stable  Holding HCTZ due to hypoNa+  On dilt if bp increase will consider BB       Acute on chronic systolic congestive heart failure with EF of 40-45% Improve after relief bladder obstruction Seems to be compensated at this time, continue to monitor  Iron deficiency anemia We'll start iron supplement  DVT prophylaxis: Eliquis  Code Status: DNR  Family Communication: Wife at bedside  Disposition Plan: Likely d/c in next 24 hrs   Consultants:   None   Procedures:   None   Antimicrobials: Anti-infectives    Start     Dose/Rate Route Frequency Ordered Stop   11/25/16 0400  cefTRIAXone (ROCEPHIN) 1 g in dextrose 5 % 50 mL IVPB  Status:  Discontinued     1 g 100 mL/hr over 30 Minutes Intravenous Every 24 hours 11/24/16 0816 11/25/16 1741   11/24/16 0300  cefTRIAXone (ROCEPHIN) 1 g in dextrose 5 % 50 mL IVPB     1 g 100 mL/hr over 30 Minutes Intravenous  Once 11/24/16 0255 11/24/16 0401        Objective: Vitals:   11/24/16 1326 11/24/16 2140 11/25/16 0439 11/25/16 1158  BP: (!) 102/50 107/76 127/76   Pulse:  86 80 (!) 107  Resp: 16 17 16    Temp: 98.4 F (36.9 C) 97.9 F (36.6 C) 97.6 F (36.4 C)   TempSrc:  Oral Oral   SpO2: 100% 96% 97%   Weight:      Height:        Intake/Output Summary (Last 24 hours) at 11/25/16 1722 Last data filed at 11/25/16 1109  Gross per 24 hour  Intake               53 ml  Output             2807 ml  Net            -2754 ml   Filed Weights   11/24/16 0113  Weight: 63 kg (139 lb)    Examination:  General exam: Appears calm and comfortable, hard hearing  Respiratory system: Clear to auscultation. No wheezes,crackle or rhonchi Cardiovascular system: S1 & S2 heard, RRR. No JVD, murmurs, rubs or gallops Gastrointestinal system: Abdomen is nondistended, soft and nontender.  GU: Foley in place, yellow urine  Central nervous system: Alert and oriented. No focal neurological deficits. Extremities: No pedal edema. Symmetric Skin: No rashes, lesions or ulcers Psychiatry: Judgement and insight appear normal.  Mood & affect appropriate.    Data Reviewed: I have personally reviewed following labs and imaging studies  CBC:  Recent Labs Lab 11/24/16 0139 11/25/16 0557  WBC 13.2* 7.4  NEUTROABS 11.6*  --   HGB 11.4* 12.1*  HCT 31.9* 34.9*  MCV 76.9* 78.8  PLT 483* 237*   Basic Metabolic Panel:  Recent Labs Lab 11/24/16 0139 11/24/16 0909 11/24/16 1239 11/24/16 1716 11/25/16 0557  NA 120* 128* 127* 127* 133*  K 3.6 4.2 4.4 4.2 4.5  CL 88* 95* 98* 96* 101  CO2 22 24 21* 23 25  GLUCOSE 146* 141* 98 171* 102*  BUN 12 9 9 10 9   CREATININE 0.93 1.01 1.12 1.16 1.09  CALCIUM 8.2* 8.8* 8.5* 8.3* 8.4*  MG 1.4*  --   --   --   --    GFR: Estimated Creatinine Clearance: 42.3 mL/min (by C-G formula based on SCr of 1.09 mg/dL). Liver Function Tests:  Recent Labs Lab 11/24/16 0139  AST 21  ALT 12*  ALKPHOS 54  BILITOT 0.3  PROT 6.0*  ALBUMIN 3.0*   No results for input(s): LIPASE, AMYLASE in the last 168 hours. No results for input(s): AMMONIA in the last 168 hours. Coagulation Profile: No results for input(s): INR, PROTIME in the last 168 hours. Cardiac Enzymes:  Recent Labs Lab 11/24/16 0139  TROPONINI <0.03   BNP (last 3 results) No results for input(s): PROBNP in the last 8760 hours. HbA1C: No results for input(s): HGBA1C in the last 72 hours. CBG:  Recent Labs Lab 11/24/16 0149  GLUCAP 146*   Lipid Profile: No results for input(s): CHOL, HDL, LDLCALC, TRIG, CHOLHDL, LDLDIRECT in the last 72 hours. Thyroid Function Tests:  Recent Labs  11/24/16 0909  TSH 7.432*   Anemia Panel:  Recent Labs  11/25/16 0557  VITAMINB12 741  FOLATE 24.0  FERRITIN 155  TIBC 248*  IRON 35*   Sepsis Labs:  Recent Labs Lab 11/24/16 0210  LATICACIDVEN 1.13    Recent Results (from the past 240 hour(s))  Urine culture     Status: None   Collection Time: 11/24/16  4:20 AM  Result Value Ref Range Status   Specimen Description URINE, RANDOM  Final   Special  Requests NONE  Final   Culture   Final    NO GROWTH Performed at [facility], [place] [address], [place], [place] [address]    Report Status 11/25/2016 FINAL  Final  Culture, blood (Routine X 2) w Reflex to ID Panel     Status: None (Preliminary result)   Collection Time: 11/24/16  9:09 AM  Result Value Ref Range Status  Specimen Description BLOOD LEFT ARM  Final   Special Requests   Final    BOTTLES DRAWN AEROBIC AND ANAEROBIC Blood Culture adequate volume   Culture   Final    NO GROWTH 1 DAY Performed at [facility], [address], [place], [place] [address]    Report Status PENDING  Incomplete  Culture, blood (Routine X 2) w Reflex to ID Panel     Status: None (Preliminary result)   Collection Time: 11/24/16  9:09 AM  Result Value Ref Range Status   Specimen Description BLOOD RIGHT HAND  Final   Special Requests IN PEDIATRIC BOTTLE Blood Culture adequate volume  Final   Culture   Final    NO GROWTH 1 DAY Performed at [facility], [place] [address], [place], [place] [address]    Report Status PENDING  Incomplete         Radiology Studies: Dg Chest Port 1 View  Result Date: 11/24/2016 CLINICAL DATA:  83 year old male with generalized weakness and shortness of breath. EXAM: PORTABLE CHEST 1 VIEW COMPARISON:  Chest radiograph dated 08/15/2016 FINDINGS: There is diffuse chronic interstitial coarsening. Left lung base linear atelectasis/scarring. There is no focal consolidation, pleural effusion, or pneumothorax. The cardiac silhouette is within normal limits. Median sternotomy wires, CABG vascular clips and LIMA bypass graft noted. Multiple calcified mediastinal and hilar lymph nodes again seen. No acute osseous pathology. There degenerative changes of the shoulders. IMPRESSION: No active disease. Electronically Signed   By: Anner Crete M.D.   On: 11/24/2016 02:44      Scheduled Meds: . amiodarone  200 mg Oral Daily  . apixaban  5 mg Oral BID  .  diltiazem  240 mg Oral Daily  . docusate sodium  100 mg Oral BID  . feeding supplement (ENSURE ENLIVE)  237 mL Oral Q24H  . finasteride  5 mg Oral QPM  . multivitamin  1 tablet Oral Daily  . multivitamin with minerals  1 tablet Oral Daily  . pravastatin  20 mg Oral q1800  . saccharomyces boulardii   Oral Daily  . sodium chloride flush  3 mL Intravenous Q12H   Continuous Infusions: . cefTRIAXone (ROCEPHIN)  IV Stopped (11/25/16 0509)     LOS: 1 day    Chipper Oman, MD Pager: Text Page via [email or web address]  [phone]  If 7PM-7AM, please contact night-coverage [email or web address] Password [email or web address] 11/25/2016, 5:22 PM

## 2016-11-25 NOTE — Care Management Note (Signed)
Case Management Note  Patient Details  Name: BLANCHARD WILLHITE MRN: [id number] Date of Birth: 02/08/1929  Subjective/Objective: 83 y/o m admitted w/hyponatremia. Readmit. From home. PT-recc HHPT-ordered. Patient chose [facility] aware of order,& following.                   Action/Plan:d/c home w/HHC.   Expected Discharge Date:                  Expected Discharge Plan:  [place]  In-House Referral:     Discharge planning Services  CM Consult  Post Acute Care Choice:    Choice offered to:  Patient  DME Arranged:    DME Agency:     HH Arranged:  PT Grandfather:  [place]  Status of Service:  In process, will continue to follow  If discussed at Long Length of Stay Meetings, dates discussed:    Additional Comments:  Dessa Phi, RN 11/25/2016, 2:32 PM

## 2016-11-25 NOTE — Evaluation (Signed)
Occupational Therapy Evaluation Patient Details Name: Justin [name] MRN: [id number] DOB: January 23, 1929 Today's Date: 11/25/2016    History of Present Illness Per chart, this 83 y.o. male with history of  coronary artery disease status post CABG, atrial fibrillation on amiodarone, diltiazem and Eliquis, hypertension, hyperlipidemia, BPH status post robotic-assisted laparoscopic simple prostatectomy by Dr. Alyson Ingles on 11/04/16, who who presented with generalized weakness, nausea and vomiting.  He was discharged to rehab after his procedure on 5/10 and was walking with PT.  He was discharged to his ILF a week ago.  For the last 3-4 days he has had worsening weakness that has made ambulation difficult.  He required assistance getting out of the bathtub which was unusual.     Clinical Impression   Pt was admitted for the above.  Will follow in acute setting to increase safety with adls.  Pt currently needs min guard assistance at safety cues at this time.  Goals are for supervision level in acute    Follow Up Recommendations  No OT follow up;Supervision/Assistance - 24 hour    Equipment Recommendations  None recommended by OT    Recommendations for Other Services       Precautions / Restrictions Precautions Precautions: Fall Precaution Comments: most recent fall was ~13 months ago during a balance class, pt had L hip fracture Restrictions Weight Bearing Restrictions: No      Mobility Bed Mobility               General bed mobility comments: pt up in recliner on arrival  Transfers Overall transfer level: Needs assistance Equipment used: Rolling walker (2 wheeled) Transfers: Sit to/from Stand Sit to Stand: Min guard         General transfer comment: verbal cues for hand placement and safe technique    Balance Overall balance assessment: History of Falls;Needs assistance         Standing balance support: Bilateral upper extremity supported Standing balance-Leahy Scale:  Poor Standing balance comment: requiring bil UE support today               High Level Balance Comments: hx of fall during a balance class resulting in fx s/p R hip hemiarthroplasty, pt had outpatient PT for balance and gait training end of last year(2017)           ADL either performed or assessed with clinical judgement   ADL Overall ADL's : Needs assistance/impaired     Grooming: Min guard;Standing;Wash/dry hands   Upper Body Bathing: Set up;Sitting   Lower Body Bathing: Min guard;Sit to/from stand   Upper Body Dressing : Set up;Sitting   Lower Body Dressing: Sit to/from stand;Min guard   Toilet Transfer: Min guard;Ambulation;RW   Toileting- Water quality scientist and Hygiene: Min guard;Sit to/from stand         General ADL Comments: pt tends to move quickly. Cues for safety, especially when backing up to open door.  Reminded him to take walker with him when he was backing up from standing at sink.  Pt states he has had catheter for awhile     Vision         Perception     Praxis      Pertinent Vitals/Pain Pain Assessment: No/denies pain     Hand Dominance     Extremity/Trunk Assessment Upper Extremity Assessment Upper Extremity Assessment: RUE deficits/detail;LUE deficits/detail (bil shoulders limited to approximately 90; has had past sxs)  Does not interfere with function  Cervical / Trunk Assessment Cervical / Trunk Assessment: Kyphotic   Communication Communication Communication: No difficulties   Cognition Arousal/Alertness: Awake/alert Behavior During Therapy: WFL for tasks assessed/performed Overall Cognitive Status:  (decreased memory per chart)                                 General Comments: cues for safety and to bring walker out of bathroom after standing at sink   General Comments       Exercises     Shoulder Instructions      Home Living Family/patient expects to be discharged to:: Private  residence Living Arrangements: Spouse/significant other Available Help at Discharge: Family Type of Home: Independent living facility Home Access: Level entry     Home Layout: One level     Bathroom Shower/Tub: Walk-in shower (need to verify with wife; per chart, he had difficulty getting out of tub)   Bathroom Toilet: Handicapped height     Home Equipment: Environmental consultant - 2 wheels;Cane - single point;Shower seat;Grab bars - toilet;Grab bars - tub/shower   Additional Comments: pt and wife live in independent living at [facility]      Prior Functioning/Environment Level of Independence: Independent with assistive device(s)        Comments: uses SPC inside, RW outside, takes exercise classes; stopped driving due to memory issues, wife drives        OT Problem List: Decreased strength;Decreased activity tolerance;Impaired balance (sitting and/or standing);Decreased cognition      OT Treatment/Interventions: Self-care/ADL training;DME and/or AE instruction;Patient/family education;Balance training    OT Goals(Current goals can be found in the care plan section) Acute Rehab OT Goals Patient Stated Goal: go back home today OT Goal Formulation: With patient Time For Goal Achievement: 12/02/16 Potential to Achieve Goals: Good ADL Goals Pt Will Transfer to Toilet: (P) with supervision;ambulating;grab bars (high commode) Additional ADL Goal #1: (P) pt will perform LB adls and hygiene at supervision level with one vc for safety  OT Frequency: Min 2X/week   Barriers to D/C:            Co-evaluation              AM-PAC PT "6 Clicks" Daily Activity     Outcome Measure Help from another person eating meals?: None Help from another person taking care of personal grooming?: A Little Help from another person toileting, which includes using toliet, bedpan, or urinal?: A Little Help from another person bathing (including washing, rinsing, drying)?: A Little Help from another  person to put on and taking off regular upper body clothing?: A Little Help from another person to put on and taking off regular lower body clothing?: A Little 6 Click Score: 19   End of Session    Activity Tolerance: Patient tolerated treatment well Patient left: in chair;with call bell/phone within reach;with chair alarm set  OT Visit Diagnosis: Unsteadiness on feet (R26.81)                Time: 9509-3267 OT Time Calculation (min): 13 min Charges:  OT General Charges $OT Visit: 1 Procedure OT Evaluation $OT Eval Low Complexity: 1 Procedure G-Codes:     [place], OTR/L [phone] 11/25/2016  Remmy Crass 11/25/2016, 1:24 PM

## 2016-11-26 LAB — BASIC METABOLIC PANEL
Anion gap: 6 (ref 5–15)
BUN: 15 mg/dL (ref 6–20)
CHLORIDE: 100 mmol/L — AB (ref 101–111)
CO2: 27 mmol/L (ref 22–32)
CREATININE: 1.21 mg/dL (ref 0.61–1.24)
Calcium: 8.4 mg/dL — ABNORMAL LOW (ref 8.9–10.3)
GFR calc Af Amer: >60 mL/min (ref >60)
GFR calc non Af Amer: 52 mL/min — ABNORMAL LOW (ref >60)
GLUCOSE: 101 mg/dL — AB (ref 65–99)
POTASSIUM: 4 mmol/L (ref 3.5–5.1)
Sodium: 133 mmol/L — ABNORMAL LOW (ref 135–145)

## 2016-11-26 LAB — MAGNESIUM: Magnesium: 1.8 mg/dL (ref 1.7–2.4)

## 2016-11-26 MED ORDER — FERROUS SULFATE 325 (65 FE) MG PO TABS: 325.0000 mg | ORAL_TABLET | Freq: Two times a day (BID) | ORAL | 0 refills | Status: DC

## 2016-11-26 MED ORDER — SULFAMETHOXAZOLE-TRIMETHOPRIM 800-160 MG PO TABS: 1.0000 | ORAL_TABLET | Freq: Two times a day (BID) | ORAL | 0 refills | Status: AC

## 2016-11-26 MED ORDER — AMIODARONE HCL 200 MG PO TABS
200.0000 mg | ORAL_TABLET | Freq: Every day | ORAL | 6 refills | Status: DC
Start: 2016-11-26 — End: 2017-01-12

## 2016-11-26 NOTE — Discharge Summary (Signed)
Physician Discharge Summary  Justin [name]  EHM:[id number]  DOB: 25-Jun-1929  DOA: 11/24/2016 PCP: Mosie Lukes, MD  Admit date: 11/24/2016 Discharge date: 11/26/2016  Admitted From: ALF  Disposition:  ALF   Recommendations for Outpatient Follow-up:  1. Follow up with PCP in 1-2 weeks 2. Please obtain BMP/CBC in one week 3. Follow up with Urology in 1 week to re evaluate foley catheter   Discharge Condition: Stable  CODE STATUS: DNR  Diet recommendation: Heart Healthy    Brief/Interim Summary: Justin [name] is a 83 year old male with history of CAD, A. fib, hypertension, BPH status post robotic-assisted laparoscopic prostatectomy on 11/04/2016 presented to the emergency department complaining of generalized weakness nausea and vomiting. Patient urine catheter removed prior to admission on 11/23/2016, but he has been having difficulty with urination since then. Prior to coming to the emergency room he passed large blood clot and subsequently started voiding without any issues. In the ED his UA was grossly abnormal,  they placed a new Foley catheter and started IV antibiotic for possible UTI. Patient was found to have QTC prolongation 519 and sodium of 120. Patient admitted for treatment of symptomatic hyponatremia and possible UTI. Initially treat with rocephin which was d/ced given urine cultures showed no growth. Patient sodium increase with resolution of bladder obstruction as kidney function also improved. Patient was placed on fluid restriction initially. Patient has clinically improved and lab workup are normalizing. Patient will be discharge home with foley cath in place with Urology follow up.   Subjective: Patient seen and examined on the day of discharge, patient continues to do well, have no complaints today. Urine clear with good UOP. Remains afebrile, ambulating the hall with no issues and tolerating diet well.   Discharge Diagnoses/Hospital Course:  Generalized weakness due  to hyponatremia  Patient report weakness has improve, he is walking around the hall  PT recommending HH PT   Hyponatremia - improved  Likely from HCTZ and urine retention  Will d/c HCTZ for now, patient's BP stable during hospital stay  Was on water restriction, can resume normal water intake upon discharge   Monitor BMP in 1 week   Abnormal UA both no bacterial growth and urine likely to be UTI Leukocytosis - likely reactive - resolved  Initially treated w/ Rocephin was d/c given no growth on urine culture, although patient had abx prior to urine cultures, 1 dose of bactrim. Will complete a course of Bactrim x 3 day for completion purpose.  S/p prostatectomy - 11/04/16 Foley placed in the ED - Will continue foley cath until patient is evaluated by Urologist, recommended to make an appointment in 1 week.   Prolong QTc w/ hypomagnesemia  Magnesium repleted  QTc Improved - EKG upon discharge QTc 479 Follow up with PCP in 1-2 weeks to repeat labs and EKG   Paroxysmal atrial fibrillation - remains in A. fib heart rate well controlled Patient on Eliquis  On Dilt and amiodarone  Amio dose decrease per schedule  No changes in medications were made during hospital stay   Essential hypertension - stable  HCTZ d/ced due to hypoNa+  On dilt if bp increases can consider adding a BB - defer to PCP   Acute on chronic systolic congestive heart failure with EF of 40-45% Improve after relief bladder obstruction Seems to be compensated at this time, continue to monitor, follow up as outpatient.   Iron deficiency anemia Started on iron supplement  All other chronic medical condition were  stable during the hospitalization.  Patient was seen by physical therapy, recommending home health PT  On the day of the discharge the patient's vitals were stable, and no other acute medical condition were reported by patient. Patient was felt safe to be discharge to home   Discharge  Instructions  You were cared for by a hospitalist during your hospital stay. If you have any questions about your discharge medications or the care you received while you were in the hospital after you are discharged, you can call the unit and asked to speak with the hospitalist on call if the hospitalist that took care of you is not available. Once you are discharged, your primary care physician will handle any further medical issues. Please note that NO REFILLS for any discharge medications will be authorized once you are discharged, as it is imperative that you return to your primary care physician (or establish a relationship with a primary care physician if you do not have one) for your aftercare needs so that they can reassess your need for medications and monitor your lab values.  Discharge Instructions    Call MD for:  difficulty breathing, headache or visual disturbances    Complete by:  As directed    Call MD for:  extreme fatigue    Complete by:  As directed    Call MD for:  hives    Complete by:  As directed    Call MD for:  persistant dizziness or light-headedness    Complete by:  As directed    Call MD for:  persistant nausea and vomiting    Complete by:  As directed    Call MD for:  redness, tenderness, or signs of infection (pain, swelling, redness, odor or green/yellow discharge around incision site)    Complete by:  As directed    Call MD for:  severe uncontrolled pain    Complete by:  As directed    Call MD for:  temperature >100.4    Complete by:  As directed    Diet - low sodium heart healthy    Complete by:  As directed    Discharge instructions    Complete by:  As directed    Keep foley in place until evaluated by ED  Complete oral antibiotic until last pill is done  Follow up with PCP for lab work monitoring   Increase activity slowly    Complete by:  As directed      Allergies as of 11/26/2016      Reactions   Dutasteride Diarrhea   GI upset and incontinence    Toradol [ketorolac Tromethamine] Other (See Comments)   CNS-AMS Dizzy and light headed   Vicodin [hydrocodone-acetaminophen] Other (See Comments)   hallucinations      Medication List    STOP taking these medications   hydrochlorothiazide 25 MG tablet Commonly known as:  HYDRODIURIL     TAKE these medications   acetaminophen 325 MG tablet Commonly known as:  TYLENOL Take 1-2 tablets (325-650 mg total) by mouth every 6 (six) hours as needed for mild pain or moderate pain. What changed:  how much to take   amiodarone 200 MG tablet Commonly known as:  PACERONE Take 1 tablet (200 mg total) by mouth daily. 1 TABLET TWICE DAILY X 2 WEEKS THEN 1 TABLET ONCE DAILY What changed:  how much to take  how to take this  when to take this   [place] 1 application topically  daily.   beta carotene w/minerals tablet Take 1 tablet by mouth daily.   diltiazem 120 MG 24 hr capsule Commonly known as:  CARTIA XT Take 2 capsules (240 mg total) by mouth daily.   ELIQUIS 5 MG Tabs tablet Generic drug:  apixaban Take 5 mg by mouth 2 (two) times daily.   ferrous sulfate 325 (65 FE) MG tablet Take 1 tablet (325 mg total) by mouth 2 (two) times daily with a meal.   finasteride 5 MG tablet Commonly known as:  PROSCAR Take 5 mg by mouth every evening.   lovastatin 20 MG tablet Commonly known as:  MEVACOR TAKE ONE TABLET BY MOUTH ONCE DAILY AT BEDTIME   LUBRICANT EYE DROPS OP Apply 1 drop to eye 2 (two) times daily as needed (dry eyes).   meclizine 25 MG tablet Commonly known as:  ANTIVERT Take 1 tablet (25 mg total) by mouth 3 (three) times daily as needed for dizziness.   multivitamin with minerals Tabs tablet Take 1 tablet by mouth daily.   PROBIOTIC DAILY PO Take 1 capsule by mouth daily.   sulfamethoxazole-trimethoprim 800-160 MG tablet Commonly known as:  BACTRIM DS,SEPTRA DS Take 1 tablet by mouth 2 (two) times daily. Start the day prior to  foley removal appointment   traMADol 50 MG tablet Commonly known as:  ULTRAM Take 1 tablet (50 mg total) by mouth every 6 (six) hours as needed for severe pain.      Follow-up Information    Mosie Lukes, MD. Schedule an appointment as soon as possible for a visit in 1 week(s).   Specialty:  Family Medicine Why:  Hospital follow up Contact information: [address] [place] [address] [phone]        Festus Aloe, MD. Schedule an appointment as soon as possible for a visit in 1 week(s).   Specialty:  Urology Why:  Foley evaluation  Contact information: [address] [place] [address] [phone]          Allergies  Allergen Reactions  . Dutasteride Diarrhea    GI upset and incontinence  . Toradol [Ketorolac Tromethamine] Other (See Comments)    CNS-AMS Dizzy and light headed  . Vicodin [Hydrocodone-Acetaminophen] Other (See Comments)    hallucinations    Consultations: None   Procedures/Studies: Dg Chest Port 1 View  Result Date: 11/24/2016 CLINICAL DATA:  83 year old male with generalized weakness and shortness of breath. EXAM: PORTABLE CHEST 1 VIEW COMPARISON:  Chest radiograph dated 08/15/2016 FINDINGS: There is diffuse chronic interstitial coarsening. Left lung base linear atelectasis/scarring. There is no focal consolidation, pleural effusion, or pneumothorax. The cardiac silhouette is within normal limits. Median sternotomy wires, CABG vascular clips and LIMA bypass graft noted. Multiple calcified mediastinal and hilar lymph nodes again seen. No acute osseous pathology. There degenerative changes of the shoulders. IMPRESSION: No active disease. Electronically Signed   By: Anner Crete M.D.   On: 11/24/2016 02:44       Discharge Exam: Vitals:   11/25/16 2150 11/26/16 0436  BP: 117/71 130/73  Pulse: 73 71  Resp: 18 16  Temp: 98.2 F (36.8 C) 98.3 F (36.8 C)   Vitals:   11/25/16 1158 11/25/16 1300 11/25/16  2150 11/26/16 0436  BP:  130/74 117/71 130/73  Pulse: (!) 107 89 73 71  Resp:  18 18 16   Temp:  97.6 F (36.4 C) 98.2 F (36.8 C) 98.3 F (36.8 C)  TempSrc:  Oral Oral Oral  SpO2:  98% 98% 97%  Weight:      Height:        General: Pt is alert, awake, not in acute distress Cardiovascular: S1S2 Irr Irr, + 2/6 SM  Respiratory: CTA bilaterally, no wheezing, no rhonchi Abdominal: Soft, NT, ND, bowel sounds + GU: Foley cath in place, urine dark yellow Extremities: no edema, no cyanosis   The results of significant diagnostics from this hospitalization (including imaging, microbiology, ancillary and laboratory) are listed below for reference.     Microbiology: Recent Results (from the past 240 hour(s))  Urine culture     Status: None   Collection Time: 11/24/16  4:20 AM  Result Value Ref Range Status   Specimen Description URINE, RANDOM  Final   Special Requests NONE  Final   Culture   Final    NO GROWTH Performed at [facility], [address] [place], [place] [address]    Report Status 11/25/2016 FINAL  Final  Culture, blood (Routine X 2) w Reflex to ID Panel     Status: None (Preliminary result)   Collection Time: 11/24/16  9:09 AM  Result Value Ref Range Status   Specimen Description BLOOD LEFT ARM  Final   Special Requests   Final    BOTTLES DRAWN AEROBIC AND ANAEROBIC Blood Culture adequate volume   Culture   Final    NO GROWTH 2 DAYS Performed at [facility], [place] [address], [place], [place] [address]    Report Status PENDING  Incomplete  Culture, blood (Routine X 2) w Reflex to ID Panel     Status: None (Preliminary result)   Collection Time: 11/24/16  9:09 AM  Result Value Ref Range Status   Specimen Description BLOOD RIGHT HAND  Final   Special Requests IN PEDIATRIC BOTTLE Blood Culture adequate volume  Final   Culture   Final    NO GROWTH 2 DAYS Performed at [facility], [place] [address] [place], [place] [address]    Report Status  PENDING  Incomplete     Labs: BNP (last 3 results)  Recent Labs  11/24/16 0909  BNP 093.2*   Basic Metabolic Panel:  Recent Labs Lab 11/24/16 0139 11/24/16 0909 11/24/16 1239 11/24/16 1716 11/25/16 0557 11/26/16 0523  NA 120* 128* 127* 127* 133* 133*  K 3.6 4.2 4.4 4.2 4.5 4.0  CL 88* 95* 98* 96* 101 100*  CO2 22 24 21* 23 25 27   GLUCOSE 146* 141* 98 171* 102* 101*  BUN 12 9 9 10 9 15   CREATININE 0.93 1.01 1.12 1.16 1.09 1.21  CALCIUM 8.2* 8.8* 8.5* 8.3* 8.4* 8.4*  MG 1.4*  --   --   --   --  1.8   Liver Function Tests:  Recent Labs Lab 11/24/16 0139  AST 21  ALT 12*  ALKPHOS 54  BILITOT 0.3  PROT 6.0*  ALBUMIN 3.0*   No results for input(s): LIPASE, AMYLASE in the last 168 hours. No results for input(s): AMMONIA in the last 168 hours. CBC:  Recent Labs Lab 11/24/16 0139 11/25/16 0557  WBC 13.2* 7.4  NEUTROABS 11.6*  --   HGB 11.4* 12.1*  HCT 31.9* 34.9*  MCV 76.9* 78.8  PLT 483* 471*   Cardiac Enzymes:  Recent Labs Lab 11/24/16 0139  TROPONINI <0.03   BNP: Invalid input(s): POCBNP CBG:  Recent Labs Lab 11/24/16 0149  GLUCAP 146*   D-Dimer No results for input(s): DDIMER in the last 72  hours. Hgb A1c No results for input(s): HGBA1C in the last 72 hours. Lipid Profile No results for input(s): CHOL, HDL, LDLCALC, TRIG, CHOLHDL, LDLDIRECT in the last 72 hours. Thyroid function studies  Recent Labs  11/24/16 0909  TSH 7.432*   Anemia work up  Recent Labs  11/25/16 0557  VITAMINB12 741  FOLATE 24.0  FERRITIN 155  TIBC 248*  IRON 35*   Urinalysis    Component Value Date/Time   COLORURINE YELLOW 11/24/2016 0420   APPEARANCEUR CLEAR 11/24/2016 0420   LABSPEC 1.003 (L) 11/24/2016 0420   PHURINE 7.0 11/24/2016 0420   GLUCOSEU NEGATIVE 11/24/2016 0420   GLUCOSEU NEG mg/dL 02/16/2010 0000   HGBUR LARGE (A) 11/24/2016 0420   BILIRUBINUR NEGATIVE 11/24/2016 0420   BILIRUBINUR negative 03/23/2016 1005   KETONESUR NEGATIVE  11/24/2016 0420   PROTEINUR NEGATIVE 11/24/2016 0420   UROBILINOGEN negative 03/23/2016 1005   UROBILINOGEN 0.2 09/13/2014 1634   NITRITE NEGATIVE 11/24/2016 0420   LEUKOCYTESUR SMALL (A) 11/24/2016 0420   Sepsis Labs Invalid input(s): PROCALCITONIN,  WBC,  LACTICIDVEN Microbiology Recent Results (from the past 240 hour(s))  Urine culture     Status: None   Collection Time: 11/24/16  4:20 AM  Result Value Ref Range Status   Specimen Description URINE, RANDOM  Final   Special Requests NONE  Final   Culture   Final    NO GROWTH Performed at [facility], [place] [address], [place], [place] [address]    Report Status 11/25/2016 FINAL  Final  Culture, blood (Routine X 2) w Reflex to ID Panel     Status: None (Preliminary result)   Collection Time: 11/24/16  9:09 AM  Result Value Ref Range Status   Specimen Description BLOOD LEFT ARM  Final   Special Requests   Final    BOTTLES DRAWN AEROBIC AND ANAEROBIC Blood Culture adequate volume   Culture   Final    NO GROWTH 2 DAYS Performed at [facility], [place] [address] [place], [place] [address]    Report Status PENDING  Incomplete  Culture, blood (Routine X 2) w Reflex to ID Panel     Status: None (Preliminary result)   Collection Time: 11/24/16  9:09 AM  Result Value Ref Range Status   Specimen Description BLOOD RIGHT HAND  Final   Special Requests IN PEDIATRIC BOTTLE Blood Culture adequate volume  Final   Culture   Final    NO GROWTH 2 DAYS Performed at [facility], [place] [address], [place], [place] [address]    Report Status PENDING  Incomplete    Time coordinating discharge: 35 minutes  SIGNED:  Chipper Oman, MD  [facility] 11/26/2016, 12:15 PM  Pager please text page via  [email or web address] Password [email or web address]

## 2016-11-26 NOTE — Progress Notes (Signed)
[place] orders faxed to [facility] @ [name]-[phone].

## 2016-11-29 ENCOUNTER — Telehealth: Payer: Self-pay

## 2016-11-29 DIAGNOSIS — R3914 Feeling of incomplete bladder emptying: Secondary | ICD-10-CM | POA: Diagnosis not present

## 2016-11-29 DIAGNOSIS — N401 Enlarged prostate with lower urinary tract symptoms: Secondary | ICD-10-CM | POA: Diagnosis not present

## 2016-11-29 LAB — CULTURE, BLOOD (ROUTINE X 2)
Culture: NO GROWTH
Culture: NO GROWTH
SPECIAL REQUESTS: ADEQUATE
SPECIAL REQUESTS: ADEQUATE

## 2016-11-29 NOTE — Telephone Encounter (Signed)
11/29/16  Hospital Follow    Transition Care Management Follow-up Telephone Call  ADMISSION DATE: 11/24/16  DISCHARGE DATE: 11/26/16    How have you been since you were released from the hospital? Patient      Do you understand why you were in the hospital? YES   Do you understand the discharge instrcutions? YES just does not understand her condition  Items Reviewed:  Medications reviewed:  Reviewed medications with patients wife..  Allergies reviewed: Reviewed with wife   Dietary changes reviewed: Heart Healthy Referrals reviewed: Appointment   Functional Questionnaire:   Activities of Daily Living (ADLs):  No help needed at this time   Any transportation issues/concerns?: No   Any patient concerns? Missed 2 weeks of therapy while in hospital.   Confirmed importance and date/time of follow-up visits scheduled: Yes CARDS:[id number]}  Confirmed with patient if condition begins to worsen call PCP or go to the ER. Yes   Patient was given the [place] line [phone]: Yes

## 2016-12-02 DIAGNOSIS — N320 Bladder-neck obstruction: Secondary | ICD-10-CM | POA: Diagnosis not present

## 2016-12-03 ENCOUNTER — Encounter (HOSPITAL_COMMUNITY): Payer: Self-pay | Admitting: *Deleted

## 2016-12-03 ENCOUNTER — Telehealth: Payer: Self-pay | Admitting: Cardiology

## 2016-12-03 NOTE — Telephone Encounter (Signed)
Will forward for dr crenshaw review  

## 2016-12-03 NOTE — Telephone Encounter (Signed)
Request for surgical clearance:  1. What type of surgery is being performed? Cystoscopy and Bladder Neck Resection  2. When is this surgery scheduled? 12-07-16   3. Are there any medications that need to be held prior to surgery and how long  When can pt stop his Eliquis?-Need to know asap   4. Name of physician performing surgery? Dr Nicolette Bang  5. What is your office phone and fax number? [phone] and fax is [phone]

## 2016-12-03 NOTE — Telephone Encounter (Signed)
Dc apixaban 2 d prior to procedure and resume after when ok with urology Kirk Ruths

## 2016-12-03 NOTE — Patient Instructions (Addendum)
Justin [name]  12/03/2016   Your procedure is scheduled on: 12/07/2016    Report to [facility] Main  Entrance Take [place]  elevators to 3rd floor to  [place] at  1030 AM.     Call this number if you have problems the morning of surgery [phone]    Remember: ONLY 1 PERSON MAY GO WITH YOU TO SHORT STAY TO GET  READY MORNING OF YOUR SURGERY.  Do not eat food or drink liquids :After Midnight.     Take these medicines the morning of surgery with A SIP OF WATER: Amiodarone ( Pacerone), Diltiazem ( Cartia XT)                                You may not have any metal on your body including hair pins and              piercings  Do not wear jewelry, , lotions, powders or perfumes, deodorant        .              Men may shave face and neck.   Do not bring valuables to the hospital. [place].  Contacts, dentures or bridgework may not be worn into surgery.      Patients discharged the day of surgery will not be allowed to drive home.  Name and phone number of your driver:               Please read over the following fact sheets you were given: _____________________________________________________________________             [facility] - Preparing for Surgery Before surgery, you can play an important role.  Because skin is not sterile, your skin needs to be as free of germs as possible.  You can reduce the number of germs on your skin by washing with CHG (chlorahexidine gluconate) soap before surgery.  CHG is an antiseptic cleaner which kills germs and bonds with the skin to continue killing germs even after washing. Please DO NOT use if you have an allergy to CHG or antibacterial soaps.  If your skin becomes reddened/irritated stop using the CHG and inform your nurse when you arrive at Short Stay. Do not shave (including legs and underarms) for at least 48 hours prior to the first CHG shower.  You may  shave your face/neck. Please follow these instructions carefully:  1.  Shower with CHG Soap the night before surgery and the  morning of Surgery.  2.  If you choose to wash your hair, wash your hair first as usual with your  normal  shampoo.  3.  After you shampoo, rinse your hair and body thoroughly to remove the  shampoo.                           4.  Use CHG as you would any other liquid soap.  You can apply chg directly  to the skin and wash                       Gently with a scrungie or clean washcloth.  5.  Apply the  CHG Soap to your body ONLY FROM THE NECK DOWN.   Do not use on face/ open                           Wound or open sores. Avoid contact with eyes, ears mouth and genitals (private parts).                       Wash face,  Genitals (private parts) with your normal soap.             6.  Wash thoroughly, paying special attention to the area where your surgery  will be performed.  7.  Thoroughly rinse your body with warm water from the neck down.  8.  DO NOT shower/wash with your normal soap after using and rinsing off  the CHG Soap.                9.  Pat yourself dry with a clean towel.            10.  Wear clean pajamas.            11.  Place clean sheets on your bed the night of your first shower and do not  sleep with pets. Day of Surgery : Do not apply any lotions/deodorants the morning of surgery.  Please wear clean clothes to the hospital/surgery center.  FAILURE TO FOLLOW THESE INSTRUCTIONS MAY RESULT IN THE CANCELLATION OF YOUR SURGERY PATIENT SIGNATURE_________________________________  NURSE SIGNATURE__________________________________  ________________________________________________________________________

## 2016-12-03 NOTE — Telephone Encounter (Signed)
Will fax this note to the number provided. 

## 2016-12-06 ENCOUNTER — Encounter (HOSPITAL_COMMUNITY)
Admission: RE | Admit: 2016-12-06 | Discharge: 2016-12-06 | Disposition: A | Discharge status: Home / Self Care | Payer: PPO | Source: Ambulatory Visit | Attending: Urology | Admitting: Urology

## 2016-12-06 ENCOUNTER — Encounter (HOSPITAL_COMMUNITY): Payer: Self-pay

## 2016-12-06 ENCOUNTER — Other Ambulatory Visit: Payer: Self-pay | Admitting: Urology

## 2016-12-06 DIAGNOSIS — Z885 Allergy status to narcotic agent status: Secondary | ICD-10-CM | POA: Diagnosis not present

## 2016-12-06 DIAGNOSIS — N320 Bladder-neck obstruction: Secondary | ICD-10-CM | POA: Diagnosis not present

## 2016-12-06 DIAGNOSIS — Z888 Allergy status to other drugs, medicaments and biological substances status: Secondary | ICD-10-CM | POA: Diagnosis not present

## 2016-12-06 DIAGNOSIS — I252 Old myocardial infarction: Secondary | ICD-10-CM | POA: Diagnosis not present

## 2016-12-06 DIAGNOSIS — I480 Paroxysmal atrial fibrillation: Secondary | ICD-10-CM | POA: Diagnosis not present

## 2016-12-06 DIAGNOSIS — R338 Other retention of urine: Secondary | ICD-10-CM | POA: Diagnosis not present

## 2016-12-06 DIAGNOSIS — Z951 Presence of aortocoronary bypass graft: Secondary | ICD-10-CM | POA: Diagnosis not present

## 2016-12-06 DIAGNOSIS — E785 Hyperlipidemia, unspecified: Secondary | ICD-10-CM | POA: Diagnosis not present

## 2016-12-06 DIAGNOSIS — Z85828 Personal history of other malignant neoplasm of skin: Secondary | ICD-10-CM | POA: Diagnosis not present

## 2016-12-06 DIAGNOSIS — Z87891 Personal history of nicotine dependence: Secondary | ICD-10-CM | POA: Diagnosis not present

## 2016-12-06 DIAGNOSIS — I2510 Atherosclerotic heart disease of native coronary artery without angina pectoris: Secondary | ICD-10-CM | POA: Diagnosis not present

## 2016-12-06 DIAGNOSIS — Z7901 Long term (current) use of anticoagulants: Secondary | ICD-10-CM | POA: Diagnosis not present

## 2016-12-06 DIAGNOSIS — Z79899 Other long term (current) drug therapy: Secondary | ICD-10-CM | POA: Diagnosis not present

## 2016-12-06 DIAGNOSIS — Z9079 Acquired absence of other genital organ(s): Secondary | ICD-10-CM | POA: Diagnosis not present

## 2016-12-06 DIAGNOSIS — Z87438 Personal history of other diseases of male genital organs: Secondary | ICD-10-CM | POA: Diagnosis not present

## 2016-12-06 DIAGNOSIS — I509 Heart failure, unspecified: Secondary | ICD-10-CM | POA: Diagnosis not present

## 2016-12-06 DIAGNOSIS — K648 Other hemorrhoids: Secondary | ICD-10-CM | POA: Diagnosis not present

## 2016-12-06 DIAGNOSIS — Z955 Presence of coronary angioplasty implant and graft: Secondary | ICD-10-CM | POA: Diagnosis not present

## 2016-12-06 DIAGNOSIS — I110 Hypertensive heart disease with heart failure: Secondary | ICD-10-CM | POA: Diagnosis not present

## 2016-12-06 HISTORY — DX: Heart failure, unspecified: I50.9

## 2016-12-06 NOTE — Progress Notes (Signed)
12/03/16/ Telephone note clearance cards ekg 11/26/16 epic BMP  11/26/16 CBC 11/25/16 epic stress 10/01/16 epic Echo 10/06/16 epic

## 2016-12-06 NOTE — Progress Notes (Signed)
Pt. And his wife stated at  preop that surgeons office told them he did not have to stop Eliquis prior to surgery.

## 2016-12-07 ENCOUNTER — Ambulatory Visit (HOSPITAL_COMMUNITY)
Admission: RE | Admit: 2016-12-07 | Discharge: 2016-12-07 | Disposition: A | Discharge status: Home / Self Care | Payer: PPO | Source: Ambulatory Visit | Attending: Urology | Admitting: Urology

## 2016-12-07 ENCOUNTER — Encounter (HOSPITAL_COMMUNITY)
Admission: RE | Disposition: A | Discharge status: Home / Self Care | Payer: Self-pay | Source: Ambulatory Visit | Attending: Urology

## 2016-12-07 ENCOUNTER — Ambulatory Visit (HOSPITAL_COMMUNITY): Payer: PPO | Admitting: Registered Nurse

## 2016-12-07 DIAGNOSIS — E785 Hyperlipidemia, unspecified: Secondary | ICD-10-CM | POA: Diagnosis not present

## 2016-12-07 DIAGNOSIS — I2510 Atherosclerotic heart disease of native coronary artery without angina pectoris: Secondary | ICD-10-CM | POA: Diagnosis not present

## 2016-12-07 DIAGNOSIS — R338 Other retention of urine: Secondary | ICD-10-CM | POA: Insufficient documentation

## 2016-12-07 DIAGNOSIS — N401 Enlarged prostate with lower urinary tract symptoms: Secondary | ICD-10-CM

## 2016-12-07 DIAGNOSIS — I10 Essential (primary) hypertension: Secondary | ICD-10-CM | POA: Diagnosis not present

## 2016-12-07 DIAGNOSIS — I480 Paroxysmal atrial fibrillation: Secondary | ICD-10-CM | POA: Insufficient documentation

## 2016-12-07 DIAGNOSIS — Z87891 Personal history of nicotine dependence: Secondary | ICD-10-CM | POA: Insufficient documentation

## 2016-12-07 DIAGNOSIS — N320 Bladder-neck obstruction: Secondary | ICD-10-CM | POA: Insufficient documentation

## 2016-12-07 DIAGNOSIS — I110 Hypertensive heart disease with heart failure: Secondary | ICD-10-CM | POA: Diagnosis not present

## 2016-12-07 DIAGNOSIS — Z85828 Personal history of other malignant neoplasm of skin: Secondary | ICD-10-CM | POA: Insufficient documentation

## 2016-12-07 DIAGNOSIS — Z87438 Personal history of other diseases of male genital organs: Secondary | ICD-10-CM | POA: Insufficient documentation

## 2016-12-07 DIAGNOSIS — Z79899 Other long term (current) drug therapy: Secondary | ICD-10-CM | POA: Insufficient documentation

## 2016-12-07 DIAGNOSIS — Z888 Allergy status to other drugs, medicaments and biological substances status: Secondary | ICD-10-CM | POA: Insufficient documentation

## 2016-12-07 DIAGNOSIS — Z951 Presence of aortocoronary bypass graft: Secondary | ICD-10-CM | POA: Insufficient documentation

## 2016-12-07 DIAGNOSIS — I509 Heart failure, unspecified: Secondary | ICD-10-CM | POA: Diagnosis not present

## 2016-12-07 DIAGNOSIS — Z7901 Long term (current) use of anticoagulants: Secondary | ICD-10-CM | POA: Diagnosis not present

## 2016-12-07 DIAGNOSIS — K648 Other hemorrhoids: Secondary | ICD-10-CM | POA: Insufficient documentation

## 2016-12-07 DIAGNOSIS — Z9079 Acquired absence of other genital organ(s): Secondary | ICD-10-CM | POA: Diagnosis not present

## 2016-12-07 DIAGNOSIS — Z885 Allergy status to narcotic agent status: Secondary | ICD-10-CM | POA: Insufficient documentation

## 2016-12-07 DIAGNOSIS — Z955 Presence of coronary angioplasty implant and graft: Secondary | ICD-10-CM | POA: Insufficient documentation

## 2016-12-07 DIAGNOSIS — N138 Other obstructive and reflux uropathy: Secondary | ICD-10-CM

## 2016-12-07 DIAGNOSIS — I252 Old myocardial infarction: Secondary | ICD-10-CM | POA: Insufficient documentation

## 2016-12-07 HISTORY — PX: CYSTOSCOPY: SHX5120

## 2016-12-07 HISTORY — PX: TRANSURETHRAL RESECTION OF BLADDER NECK: SHX6196

## 2016-12-07 SURGERY — CYSTOSCOPY
Anesthesia: General | Site: Bladder

## 2016-12-07 MED ORDER — LIDOCAINE 2% (20 MG/ML) 5 ML SYRINGE
INTRAMUSCULAR | Status: DC | PRN
  Administered 2016-12-07: 100 mg via INTRAVENOUS

## 2016-12-07 MED ORDER — STERILE WATER FOR IRRIGATION IR SOLN
Status: DC | PRN
Start: 2016-12-07 — End: 2016-12-07
  Administered 2016-12-07: 6000 mL

## 2016-12-07 MED ORDER — LIDOCAINE 2% (20 MG/ML) 5 ML SYRINGE
INTRAMUSCULAR | Status: AC
  Filled 2016-12-07: qty 5

## 2016-12-07 MED ORDER — CEFAZOLIN SODIUM-DEXTROSE 2-4 GM/100ML-% IV SOLN
2.0000 g | INTRAVENOUS | Status: AC
  Administered 2016-12-07: 2 g via INTRAVENOUS
  Filled 2016-12-07: qty 100

## 2016-12-07 MED ORDER — MEPERIDINE HCL 50 MG/ML IJ SOLN: 6.2500 mg | INTRAMUSCULAR | Status: DC | PRN

## 2016-12-07 MED ORDER — HYDROMORPHONE HCL 1 MG/ML IJ SOLN: 0.2500 mg | INTRAMUSCULAR | Status: DC | PRN

## 2016-12-07 MED ORDER — PROPOFOL 10 MG/ML IV BOLUS
INTRAVENOUS | Status: DC | PRN
  Administered 2016-12-07: 100 mg via INTRAVENOUS

## 2016-12-07 MED ORDER — 0.9 % SODIUM CHLORIDE (POUR BTL) OPTIME
TOPICAL | Status: DC | PRN
  Administered 2016-12-07: 1000 mL

## 2016-12-07 MED ORDER — ONDANSETRON HCL 4 MG/2ML IJ SOLN
INTRAMUSCULAR | Status: AC
  Filled 2016-12-07: qty 2

## 2016-12-07 MED ORDER — EPHEDRINE SULFATE-NACL 50-0.9 MG/10ML-% IV SOSY
PREFILLED_SYRINGE | INTRAVENOUS | Status: DC | PRN
  Administered 2016-12-07: 10 mg via INTRAVENOUS
  Administered 2016-12-07: 5 mg via INTRAVENOUS

## 2016-12-07 MED ORDER — PHENYLEPHRINE 40 MCG/ML (10ML) SYRINGE FOR IV PUSH (FOR BLOOD PRESSURE SUPPORT)
PREFILLED_SYRINGE | INTRAVENOUS | Status: AC
  Filled 2016-12-07: qty 10

## 2016-12-07 MED ORDER — PROPOFOL 10 MG/ML IV BOLUS
INTRAVENOUS | Status: AC
  Filled 2016-12-07: qty 20

## 2016-12-07 MED ORDER — PHENYLEPHRINE 40 MCG/ML (10ML) SYRINGE FOR IV PUSH (FOR BLOOD PRESSURE SUPPORT)
PREFILLED_SYRINGE | INTRAVENOUS | Status: DC | PRN
  Administered 2016-12-07: 40 ug via INTRAVENOUS
  Administered 2016-12-07: 80 ug via INTRAVENOUS

## 2016-12-07 MED ORDER — FENTANYL CITRATE (PF) 100 MCG/2ML IJ SOLN
INTRAMUSCULAR | Status: AC
  Filled 2016-12-07: qty 2

## 2016-12-07 MED ORDER — EPHEDRINE 5 MG/ML INJ
INTRAVENOUS | Status: AC
  Filled 2016-12-07: qty 10

## 2016-12-07 MED ORDER — FENTANYL CITRATE (PF) 100 MCG/2ML IJ SOLN
INTRAMUSCULAR | Status: DC | PRN
  Administered 2016-12-07 (×2): 25 ug via INTRAVENOUS

## 2016-12-07 MED ORDER — TRAMADOL HCL 50 MG PO TABS: 50.0000 mg | ORAL_TABLET | Freq: Four times a day (QID) | ORAL | 0 refills | Status: DC | PRN

## 2016-12-07 MED ORDER — ONDANSETRON HCL 4 MG/2ML IJ SOLN
INTRAMUSCULAR | Status: DC | PRN
  Administered 2016-12-07: 4 mg via INTRAVENOUS

## 2016-12-07 MED ORDER — LACTATED RINGERS IV SOLN
INTRAVENOUS | Status: DC
  Administered 2016-12-07: 11:00:00 via INTRAVENOUS

## 2016-12-07 MED ORDER — ONDANSETRON HCL 4 MG/2ML IJ SOLN: 4.0000 mg | Freq: Once | INTRAMUSCULAR | Status: DC | PRN

## 2016-12-07 SURGICAL SUPPLY — 19 items
BAG URINE DRAINAGE (UROLOGICAL SUPPLIES) IMPLANT
BAG URO CATCHER STRL LF (MISCELLANEOUS) ×3 IMPLANT
CATH FOLEY 3WAY 30CC 22FR (CATHETERS) ×3 IMPLANT
CATH INTERMIT  6FR 70CM (CATHETERS) IMPLANT
CLOTH BEACON ORANGE TIMEOUT ST (SAFETY) ×3 IMPLANT
COVER SURGICAL LIGHT HANDLE (MISCELLANEOUS) ×3 IMPLANT
ELECT REM PT RETURN 15FT ADLT (MISCELLANEOUS) ×3 IMPLANT
EVACUATOR MICROVAS BLADDER (UROLOGICAL SUPPLIES) IMPLANT
GLOVE BIOGEL M 8.0 STRL (GLOVE) ×3 IMPLANT
GOWN STRL REUS W/TWL LRG LVL3 (GOWN DISPOSABLE) ×6 IMPLANT
GOWN STRL REUS W/TWL XL LVL3 (GOWN DISPOSABLE) ×3 IMPLANT
GUIDEWIRE STR DUAL SENSOR (WIRE) IMPLANT
LOOP CUT BIPOLAR 24F LRG (ELECTROSURGICAL) ×3 IMPLANT
MANIFOLD NEPTUNE II (INSTRUMENTS) ×3 IMPLANT
PACK CYSTO (CUSTOM PROCEDURE TRAY) ×3 IMPLANT
SET ASPIRATION TUBING (TUBING) IMPLANT
SYRINGE IRR TOOMEY STRL 70CC (SYRINGE) IMPLANT
TUBING CONNECTING 10 (TUBING) ×2 IMPLANT
TUBING CONNECTING 10' (TUBING) ×1

## 2016-12-07 NOTE — Anesthesia Preprocedure Evaluation (Signed)
Anesthesia Evaluation  Patient identified by MRN, date of birth, ID band Patient awake    Reviewed: Allergy & Precautions, NPO status , Patient's Chart, lab work & pertinent test results  Airway Mallampati: I  TM Distance: >3 FB Neck ROM: Full    Dental   Pulmonary former smoker,    Pulmonary exam normal        Cardiovascular hypertension, Pt. on medications + CAD and + CABG  Normal cardiovascular exam+ dysrhythmias Atrial Fibrillation      Neuro/Psych    GI/Hepatic   Endo/Other    Renal/GU      Musculoskeletal   Abdominal   Peds  Hematology   Anesthesia Other Findings   Reproductive/Obstetrics                             Anesthesia Physical Anesthesia Plan  ASA: III  Anesthesia Plan: General   Post-op Pain Management:    Induction: Intravenous  PONV Risk Score and Plan: 1 and Ondansetron  Airway Management Planned: LMA  Additional Equipment:   Intra-op Plan:   Post-operative Plan: Extubation in OR  Informed Consent: I have reviewed the patients History and Physical, chart, labs and discussed the procedure including the risks, benefits and alternatives for the proposed anesthesia with the patient or authorized representative who has indicated his/her understanding and acceptance.     Plan Discussed with: CRNA and Surgeon  Anesthesia Plan Comments:         Anesthesia Quick Evaluation

## 2016-12-07 NOTE — H&P (Signed)
Urology Admission H&P  Chief Complaint: urinary retention  History of Present Illness: [name] is a 83yo with a hx of BPH who underwent simple prostatectomy and developed urinary retention post op. He was found to have a narrow bladder neck on office cystoscopy  Past Medical History:  Diagnosis Date  . Basal cell carcinoma   . BPH (benign prostatic hypertrophy)   . CAD (coronary artery disease) 1991   a. H/o MI w/ PTCA;  b. s/p CABG by Dr Roxan Hockey w/ LIMA-LAD, SVG-OM1-OM3, SVG-D1, SVG-PDA  . CHF (congestive heart failure) ([place])   . Complication of anesthesia    Wife reports history of confusion after last surgery in 3-17  . Disequilibrium 04/03/2012  . Dysrhythmia   . HTN (hypertension)   . Hyperlipidemia   . Internal hemorrhoids   . Medicare annual wellness visit, subsequent 03/16/2015   Follows with cardiology, Dr Stanford Breed, Follows with dermatology, Dr Altamese Cabal. Aged out of colonoscopy  . Moderate aortic stenosis    a. 08/2014 Echo: EF 55-60%, no rwma, Gr 1 DD, mod AS, mildly dil asc Ao, mild MR, sev dil LA.  [name] PAF (paroxysmal atrial fibrillation) ([place])    a. Dx 08/2014;  b. CHA2DS2VASc = 4 - decision made to withold [place] 2/2 unsteady gait and syncope.  . Pneumonia    as a baby  . Sigmoid diverticulitis   . Syncope    a. 08/2014 - ? orthostatic   Past Surgical History:  Procedure Laterality Date  . COLONOSCOPY    . CORONARY ARTERY BYPASS GRAFT  2000   Dr Roxan Hockey  LIMA-LAD, SVG-OM1-OM3, SVG-D1, SVG-PDA.   [name] EYE SURGERY     bil cataract  . HIP ARTHROPLASTY Left 10/14/2015   Procedure: LEFT HIP HEMI ARTHROPLASTY;  Surgeon: [name], MD;  Location: [facility];  Service: Orthopedics;  Laterality: Left;  . SHOULDER SURGERY     x2  . TONSILLECTOMY    . XI ROBOTIC ASSISTED SIMPLE PROSTATECTOMY N/A 11/04/2016   Procedure: XI ROBOTIC ASSISTED SIMPLE PROSTATECTOMY;  Surgeon: Cleon Gustin, MD;  Location: [facility];  Service: Urology;  Laterality: N/A;    Home Medications:   Prescriptions Prior to Admission  Medication Sig Dispense Refill Last Dose  . acetaminophen (TYLENOL) 325 MG tablet Take 1-2 tablets (325-650 mg total) by mouth every 6 (six) hours as needed for mild pain or moderate pain. (Patient taking differently: Take 325 mg by mouth every 6 (six) hours as needed for mild pain or moderate pain. ) 40 tablet 0 Past Week at Unknown time  . amiodarone (PACERONE) 200 MG tablet Take 1 tablet (200 mg total) by mouth daily. 1 TABLET TWICE DAILY X 2 WEEKS THEN 1 TABLET ONCE DAILY (Patient taking differently: Take 200 mg by mouth 2 (two) times daily. ) 74 tablet 6 12/07/2016 at 0800  . apixaban (ELIQUIS) 5 MG TABS tablet Take 5 mg by mouth 2 (two) times daily.   12/06/2016 at 0800  . beta carotene w/minerals (OCUVITE) tablet Take 1 tablet by mouth daily.   Past Week at Unknown time  . Carboxymethylcellulose Sodium (LUBRICANT EYE DROPS OP) Apply 1 drop to eye 2 (two) times daily as needed (dry eyes).   Past Month at Unknown time  . diltiazem (CARTIA XT) 120 MG 24 hr capsule Take 2 capsules (240 mg total) by mouth daily. 90 capsule 3 12/07/2016 at 0800  . ferrous sulfate 325 (65 FE) MG tablet Take 1 tablet (325 mg total) by mouth 2 (two) times daily  with a meal. 60 tablet 0 Past Week at Unknown time  . finasteride (PROSCAR) 5 MG tablet Take 5 mg by mouth every evening.    12/06/2016 at Unknown time  . lovastatin (MEVACOR) 20 MG tablet TAKE ONE TABLET BY MOUTH ONCE DAILY AT BEDTIME 90 tablet 3 12/06/2016 at Unknown time  . meclizine (ANTIVERT) 25 MG tablet Take 1 tablet (25 mg total) by mouth 3 (three) times daily as needed for dizziness. 30 tablet 0 Past Week at Unknown time  . Multiple Vitamin (MULTIVITAMIN WITH MINERALS) TABS tablet Take 1 tablet by mouth daily.   Past Week at Unknown time  . Probiotic Product (PROBIOTIC DAILY PO) Take 1 capsule by mouth daily.   Past Week at Unknown time  . Sunscreens (AVEENO ESSENTIAL MOISTURE EX) Apply 1 application topically daily.   Past  Week at Unknown time  . traMADol (ULTRAM) 50 MG tablet Take 1 tablet (50 mg total) by mouth every 6 (six) hours as needed for severe pain. (Patient not taking: Reported on 12/03/2016) 20 tablet 0 Not Taking at Unknown time   Allergies:  Allergies  Allergen Reactions  . Dutasteride Diarrhea    GI upset and incontinence  . Toradol [Ketorolac Tromethamine] Other (See Comments)    CNS-AMS Dizzy and light headed  . Vicodin [Hydrocodone-Acetaminophen] Other (See Comments)    hallucinations    Family History  Problem Relation Age of Onset  . Coronary artery disease Father   . Hyperlipidemia Father   . Heart attack Father   . Alcohol abuse Father   . Bipolar disorder Mother   . Heart disease Mother   . Alcohol abuse Brother   . Cancer Brother   . Cancer Maternal Grandfather        oral cancer, chew tobacco  . Cancer Sister   . Glaucoma Sister   . Alcohol abuse Unknown    Social History:  reports that he quit smoking about 45 years ago. He has a 7.50 pack-year smoking history. He has never used smokeless tobacco. He reports that he drinks about 0.6 - 1.2 oz of alcohol per week . He reports that he does not use drugs.  Review of Systems  All other systems reviewed and are negative.   Physical Exam:  Vital signs in last 24 hours: Temp:  [97.4 F (36.3 C)-98 F (36.7 C)] 97.4 F (36.3 C) (06/12 1017) Pulse Rate:  [49-66] 49 (06/12 1017) Resp:  [16-18] 18 (06/12 1017) BP: (144-147)/(71-72) 147/71 (06/12 1017) SpO2:  [98 %-100 %] 100 % (06/12 1017) Weight:  [63 kg (139 lb)] 63 kg (139 lb) (06/12 1044) Physical Exam  Constitutional: He is oriented to person, place, and time. He appears well-developed and well-nourished. No distress.  HENT:  Head: Normocephalic and atraumatic.  Eyes: EOM are normal. Pupils are equal, round, and reactive to light.  Neck: Normal range of motion. No thyromegaly present.  Cardiovascular: Normal rate and regular rhythm.   Respiratory: Effort normal.  No respiratory distress.  GI: Soft. He exhibits no distension.  Musculoskeletal: Normal range of motion. He exhibits no edema.  Neurological: He is alert and oriented to person, place, and time.  Skin: Skin is warm and dry.  Psychiatric: He has a normal mood and affect. His behavior is normal. Judgment and thought content normal.    Laboratory Data:  No results found for this or any previous visit (from the past 24 hour(s)). No results found for this or any previous visit (from the past 240  hour(s)). Creatinine: No results for input(s): CREATININE in the last 168 hours. Baseline Creatinine: unknown  Impression/Assessment:  83yo with bladder neck contracture  Plan:  The risks/benefits/alternatives to resection of the bladder neck was explained to the patient and he understands and wishes to proceed with surgery  Nicolette Bang 12/07/2016, 12:22 PM

## 2016-12-07 NOTE — Anesthesia Postprocedure Evaluation (Signed)
Anesthesia Post Note  Patient: Justin [name]  Procedure(s) Performed: Procedure(s) (LRB): CYSTOSCOPY (N/A) TRANSURETHRAL RESECTION OF BLADDER NECK (N/A)     Patient location during evaluation: PACU Anesthesia Type: General Level of consciousness: awake and alert Pain management: pain level controlled Vital Signs Assessment: post-procedure vital signs reviewed and stable Respiratory status: spontaneous breathing, nonlabored ventilation, respiratory function stable and patient connected to nasal cannula oxygen Cardiovascular status: blood pressure returned to baseline and stable Postop Assessment: no signs of nausea or vomiting Anesthetic complications: no    Last Vitals:  Vitals:   12/07/16 1405 12/07/16 1500  BP: 132/68 136/72  Pulse: 65 64  Resp: 13 16  Temp: 36.4 C 36.2 C    Last Pain:  Vitals:   12/07/16 1500  TempSrc: Oral  PainSc:                  Maddilynn Esperanza DAVID

## 2016-12-07 NOTE — Transfer of Care (Signed)
Immediate Anesthesia Transfer of Care Note  Patient: Justin [name]  Procedure(s) Performed: Procedure(s) with comments: CYSTOSCOPY (N/A) - NEEDS 30 MIN TOTAL FOR SURGERY TRANSURETHRAL RESECTION OF BLADDER NECK (N/A)  Patient Location: PACU  Anesthesia Type:General  Level of Consciousness: awake, alert , oriented and patient cooperative  Airway & Oxygen Therapy: Patient Spontanous Breathing and Patient connected to face mask oxygen  Post-op Assessment: Report given to RN, Post -op Vital signs reviewed and stable and Patient moving all extremities  Post vital signs: Reviewed and stable  Last Vitals:  Vitals:   12/07/16 1017  BP: (!) 147/71  Pulse: (!) 49  Resp: 18  Temp: 36.3 C    Last Pain:  Vitals:   12/07/16 1017  TempSrc: Oral      Patients Stated Pain Goal: 3 (77/41/28 7867)  Complications: No apparent anesthesia complications

## 2016-12-07 NOTE — Anesthesia Procedure Notes (Signed)
Procedure Name: LMA Insertion Date/Time: 12/07/2016 12:36 PM Performed by: Carleene Cooper A Pre-anesthesia Checklist: Patient identified, Emergency Drugs available, Patient being monitored, Suction available and Timeout performed Patient Re-evaluated:Patient Re-evaluated prior to inductionOxygen Delivery Method: Circle system utilized Preoxygenation: Pre-oxygenation with 100% oxygen Intubation Type: IV induction Ventilation: Mask ventilation without difficulty LMA: LMA with gastric port inserted LMA Size: 4.0 Number of attempts: 1 Placement Confirmation: positive ETCO2 and breath sounds checked- equal and bilateral Tube secured with: Tape Dental Injury: Teeth and Oropharynx as per pre-operative assessment

## 2016-12-07 NOTE — Brief Op Note (Signed)
12/07/2016  1:07 PM  PATIENT:  [name]  83 y.o. male  PRE-OPERATIVE DIAGNOSIS:  BLADDER NECK CONTRACTURE  POST-OPERATIVE DIAGNOSIS:  bladder neck contracture  PROCEDURE:  Procedure(s) with comments: CYSTOSCOPY (N/A) - NEEDS 30 MIN TOTAL FOR SURGERY TRANSURETHRAL RESECTION OF BLADDER NECK (N/A)  SURGEON:  Surgeon(s) and Role:    * Chayil Gantt, Candee Furbish, MD - Primary  PHYSICIAN ASSISTANT:   ASSISTANTS: none   ANESTHESIA:   general  EBL:  Total I/O In: -  Out: 250 [Urine:250]  BLOOD ADMINISTERED:none  DRAINS: Urinary Catheter (Foley)   LOCAL MEDICATIONS USED:  NONE  SPECIMEN:  No Specimen  DISPOSITION OF SPECIMEN:  N/A  COUNTS:  YES  TOURNIQUET:  * No tourniquets in log *  DICTATION: .Note written in EPIC  PLAN OF CARE: Discharge to home after PACU  PATIENT DISPOSITION:  PACU - hemodynamically stable.   Delay start of Pharmacological VTE agent (>24hrs) due to surgical blood loss or risk of bleeding: not applicable

## 2016-12-07 NOTE — Discharge Instructions (Signed)
General Anesthesia, Adult, Care After These instructions provide you with information about caring for yourself after your procedure. Your health care provider may also give you more specific instructions. Your treatment has been planned according to current medical practices, but problems sometimes occur. Call your health care provider if you have any problems or questions after your procedure. What can I expect after the procedure? After the procedure, it is common to have:  Vomiting.  A sore throat.  Mental slowness.  It is common to feel:  Nauseous.  Cold or shivery.  Sleepy.  Tired.  Sore or achy, even in parts of your body where you did not have surgery.  Follow these instructions at home: For at least 24 hours after the procedure:  Do not: ? Participate in activities where you could fall or become injured. ? Drive. ? Use heavy machinery. ? Drink alcohol. ? Take sleeping pills or medicines that cause drowsiness. ? Make important decisions or sign legal documents. ? Take care of children on your own.  Rest. Eating and drinking  If you vomit, drink water, juice, or soup when you can drink without vomiting.  Drink enough fluid to keep your urine clear or pale yellow.  Make sure you have little or no nausea before eating solid foods.  Follow the diet recommended by your health care provider. General instructions  Have a responsible adult stay with you until you are awake and alert.  Return to your normal activities as told by your health care provider. Ask your health care provider what activities are safe for you.  Take over-the-counter and prescription medicines only as told by your health care provider.  If you smoke, do not smoke without supervision.  Keep all follow-up visits as told by your health care provider. This is important. Contact a health care provider if:  You continue to have nausea or vomiting at home, and medicines are not helpful.  You  cannot drink fluids or start eating again.  You cannot urinate after 8-12 hours.  You develop a skin rash.  You have fever.  You have increasing redness at the site of your procedure. Get help right away if:  You have difficulty breathing.  You have chest pain.  You have unexpected bleeding.  You feel that you are having a life-threatening or urgent problem. This information is not intended to replace advice given to you by your health care provider. Make sure you discuss any questions you have with your health care provider. Document Released: 09/20/2000 Document Revised: 11/17/2015 Document Reviewed: 05/29/2015 Elsevier Interactive Patient Education  2018 [employer].    Indwelling Urinary Catheter Care, Adult Take good care of your catheter to keep it working and to prevent problems. How to wear your catheter Attach your catheter to your leg with tape (adhesive tape) or a leg strap. Make sure it is not too tight. If you use tape, remove any bits of tape that are already on the catheter. How to wear a drainage bag You should have:  A large overnight bag.  A small leg bag.  Overnight Bag You may wear the overnight bag at any time. Always keep the bag below the level of your bladder but off the floor. When you sleep, put a clean plastic bag in a wastebasket. Then hang the bag inside the wastebasket. Leg Bag Never wear the leg bag at night. Always wear the leg bag below your knee. Keep the leg bag secure with a leg strap or tape.  How to care for your skin  Clean the skin around the catheter at least once every day.  Shower every day. Do not take baths.  Put creams, lotions, or ointments on your genital area only as told by your doctor.  Do not use powders, sprays, or lotions on your genital area. How to clean your catheter and your skin 1. Wash your hands with soap and water. 2. Wet a washcloth in warm water and gentle (mild) soap. 3. Use the washcloth to clean the  skin where the catheter enters your body. Clean downward and wipe away from the catheter in small circles. Do not wipe toward the catheter. 4. Pat the area dry with a clean towel. Make sure to clean off all soap. How to care for your drainage bags Empty your drainage bag when it is ?- full or at least 2-3 times a day. Replace your drainage bag once a month or sooner if it starts to smell bad or look dirty. Do not clean your drainage bag unless told by your doctor. Emptying a drainage bag  Supplies Needed  Rubbing alcohol.  Gauze pad or cotton ball.  Tape or a leg strap.  Steps 1. Wash your hands with soap and water. 2. Separate (detach) the bag from your leg. 3. Hold the bag over the toilet or a clean container. Keep the bag below your hips and bladder. This stops pee (urine) from going back into the tube. 4. Open the pour spout at the bottom of the bag. 5. Empty the pee into the toilet or container. Do not let the pour spout touch any surface. 6. Put rubbing alcohol on a gauze pad or cotton ball. 7. Use the gauze pad or cotton ball to clean the pour spout. 8. Close the pour spout. 9. Attach the bag to your leg with tape or a leg strap. 10. Wash your hands.  Changing a drainage bag Supplies Needed  Alcohol wipes.  A clean drainage bag.  Adhesive tape or a leg strap.  Steps 1. Wash your hands with soap and water. 2. Separate the dirty bag from your leg. 3. Pinch the rubber catheter with your fingers so that pee does not spill out. 4. Separate the catheter tube from the drainage tube where these tubes connect (at the connection valve). Do not let the tubes touch any surface. 5. Clean the end of the catheter tube with an alcohol wipe. Use a different alcohol wipe to clean the end of the drainage tube. 6. Connect the catheter tube to the drainage tube of the clean bag. 7. Attach the new bag to the leg with adhesive tape or a leg strap. 8. Wash your hands.  How to prevent  infection and other problems  Never pull on your catheter or try to remove it. Pulling can damage tissue in your body.  Always wash your hands before and after touching your catheter.  If a leg strap gets wet, replace it with a dry one.  Drink enough fluids to keep your pee clear or pale yellow, or as told by your doctor.  Do not let the drainage bag or tubing touch the floor.  Wear cotton underwear.  If you are male, wipe from front to back after you poop (have a bowel movement).  Check on the catheter often to make sure it works and the tubing is not twisted. Get help if:  Your pee is cloudy.  Your pee smells unusually bad.  Your pee is not  draining into the bag.  Your tube gets clogged.  Your catheter starts to leak.  Your bladder feels full. Get help right away if:  You have redness, swelling, or pain where the catheter enters your body.  You have fluid, pus, or a bad smell coming from the area where the catheter enters your body.  The area where the catheter enters your body feels warm.  You have a fever.  You have pain in your: ? Stomach (abdomen). ? Legs. ? Lower back. ? Bladder.  You see blood fill the catheter.  Your pee is pink or red.  You feel sick to your stomach (nauseous).  You throw up (vomit).  You have chills.  Your catheter gets pulled out. This information is not intended to replace advice given to you by your health care provider. Make sure you discuss any questions you have with your health care provider. Document Released: 10/09/2012 Document Revised: 05/12/2016 Document Reviewed: 11/27/2013 Elsevier Interactive Patient Education  [employer].

## 2016-12-08 ENCOUNTER — Encounter (HOSPITAL_COMMUNITY): Payer: Self-pay | Admitting: Urology

## 2016-12-09 ENCOUNTER — Inpatient Hospital Stay: Payer: PPO | Admitting: Family Medicine

## 2016-12-09 NOTE — Op Note (Signed)
Preoperative diagnosis: Bladder neck contracture  Postoperative diagnosis: same  Procedure: 1 cystoscopy 2. Transurethral resection of the bladder neck  Attending: Nicolette Bang  Anesthesia: General  Estimated blood loss: Minimal  Drains: 22 French foley  Specimens: none  Antibiotics: Rocephin  Findings: apical tissue flap causes obstruction Ureteral orifices in normal anatomic location.   Indications: Patient is a 83 year old male with a history of BPH and urinary retention. He underwent simple prostatectomy and failed multiple voiding trials. On office cystoscopy he was found to have apical tissue obstructing his urethra.  After discussing treatment options, they decided proceed with transurethral resection of the bladder neck.  Procedure her in detail: The patient was brought to the operating room and a brief timeout was done to ensure correct patient, correct procedure, correct site.  General anesthesia was administered patient was placed in dorsal lithotomy position.  Their genitalia was then prepped and draped in usual sterile fashion.  A rigid 77 French cystoscope was passed in the urethra and the bladder.  Bladder was inspected and we noted no masses or lesions.  the ureteral orifices were in the normal orthotopic locations. removed the cystoscope and placed a resectoscope into the bladder. We then turned our attention to apical tissue resection. Using the bipolar resectoscope we resected the tissue then obtained hemostasis with electrocautery.  the bladder was then drained, a 22 French foley was placed and this concluded the procedure which was well tolerated by patient.  Complications: None  Condition: Stable, extubated, transferred to PACU  Plan: Patient is to be discharged home and followup in 5 days for foley catheter removal and pathology discussion.

## 2016-12-23 ENCOUNTER — Encounter: Payer: Self-pay | Admitting: Family Medicine

## 2016-12-23 ENCOUNTER — Ambulatory Visit (INDEPENDENT_AMBULATORY_CARE_PROVIDER_SITE_OTHER): Payer: PPO | Admitting: Family Medicine

## 2016-12-23 VITALS — BP 130/70 | HR 65 | Temp 97.4°F | Ht 65.0 in | Wt 134.4 lb

## 2016-12-23 DIAGNOSIS — Z09 Encounter for follow-up examination after completed treatment for conditions other than malignant neoplasm: Secondary | ICD-10-CM | POA: Diagnosis not present

## 2016-12-23 DIAGNOSIS — R350 Frequency of micturition: Secondary | ICD-10-CM

## 2016-12-23 LAB — COMPREHENSIVE METABOLIC PANEL
ALT: 19 U/L (ref 0–53)
AST: 21 U/L (ref 0–37)
Albumin: 4.1 g/dL (ref 3.5–5.2)
Alkaline Phosphatase: 63 U/L (ref 39–117)
BUN: 22 mg/dL (ref 6–23)
CALCIUM: 9.8 mg/dL (ref 8.4–10.5)
CHLORIDE: 99 meq/L (ref 96–112)
CO2: 25 meq/L (ref 19–32)
CREATININE: 1.18 mg/dL (ref 0.40–1.50)
GFR: 62.16 mL/min (ref >60.00)
Glucose, Bld: 97 mg/dL (ref 70–99)
POTASSIUM: 4.5 meq/L (ref 3.5–5.1)
Sodium: 133 mEq/L — ABNORMAL LOW (ref 135–145)
Total Bilirubin: 0.6 mg/dL (ref 0.2–1.2)
Total Protein: 7 g/dL (ref 6.0–8.3)

## 2016-12-23 LAB — CBC
HEMATOCRIT: 39.5 % (ref 39.0–52.0)
Hemoglobin: 13 g/dL (ref 13.0–17.0)
MCHC: 32.9 g/dL (ref 30.0–36.0)
MCV: 83.3 fl (ref 78.0–100.0)
PLATELETS: 411 10*3/uL — AB (ref 150.0–400.0)
RBC: 4.74 Mil/uL (ref 4.22–5.81)
RDW: 17.3 % — ABNORMAL HIGH (ref 11.5–15.5)
WBC: 8.2 10*3/uL (ref 4.0–10.5)

## 2016-12-23 NOTE — Progress Notes (Signed)
Chief Complaint  Patient presents with  . Hospitalization Follow-up    for BPH w. urinary obstruction  . Fall    hurt (L) lower leg and elbow    Subjective: Patient is a 83 y.o. male here for hospital f/u. He presents after 14 days of discharge. He is here with his wife.  The patient was admitted on 11/24/16 through 11/26/16 at [facility] for generalized weakness due to hyponatremia. His hydrochlorothiazide was discontinued and his blood pressure remained controlled. Today, he feels much better. He continues to improve with his weakness. He is eating and drinking normally. His balance issues continue. His wife is pleased with how much he is moving around. He does require the use of a walker or cane. He brought his cane to his appointment today.  The patient's biggest complaint is that he urinates frequently throughout the night. He will go maybe 1 or 2 times during the day, however will wake up around 4 times during the night. He states that he does not have incomplete emptying or pain. His stream is weak but steady. He last saw blood in his urine around 1 week ago. This is getting less frequent since his bladder procedure on 12/07/16. He consumes coffee in the morning however nothing after noon. He does drink 2-3 glasses of wine with dinner and before bed.  ROS: Neuro: No weakness GU: As noted in HPI  Family History  Problem Relation Age of Onset  . Coronary artery disease Father   . Hyperlipidemia Father   . Heart attack Father   . Alcohol abuse Father   . Bipolar disorder Mother   . Heart disease Mother   . Alcohol abuse Brother   . Cancer Brother   . Cancer Maternal Grandfather        oral cancer, chew tobacco  . Cancer Sister   . Glaucoma Sister   . Alcohol abuse Unknown    Past Medical History:  Diagnosis Date  . Basal cell carcinoma   . BPH (benign prostatic hypertrophy)   . CAD (coronary artery disease) 1991   a. H/o MI w/ PTCA;  b. s/p CABG by Dr Roxan Hockey w/  LIMA-LAD, SVG-OM1-OM3, SVG-D1, SVG-PDA  . CHF (congestive heart failure) ([place])   . Complication of anesthesia    Wife reports history of confusion after last surgery in 3-17  . Disequilibrium 04/03/2012  . Dysrhythmia   . HTN (hypertension)   . Hyperlipidemia   . Internal hemorrhoids   . Medicare annual wellness visit, subsequent 03/16/2015   Follows with cardiology, Dr Stanford Breed, Follows with dermatology, Dr Altamese Cabal. Aged out of colonoscopy  . Moderate aortic stenosis    a. 08/2014 Echo: EF 55-60%, no rwma, Gr 1 DD, mod AS, mildly dil asc Ao, mild MR, sev dil LA.  [name] PAF (paroxysmal atrial fibrillation) ([place])    a. Dx 08/2014;  b. CHA2DS2VASc = 4 - decision made to withold [place] 2/2 unsteady gait and syncope.  . Pneumonia    as a baby  . Sigmoid diverticulitis   . Syncope    a. 08/2014 - ? orthostatic   Allergies  Allergen Reactions  . Dutasteride Diarrhea    GI upset and incontinence  . Toradol [Ketorolac Tromethamine] Other (See Comments)    CNS-AMS Dizzy and light headed  . Vicodin [Hydrocodone-Acetaminophen] Other (See Comments)    hallucinations    Current Outpatient Prescriptions:  .  acetaminophen (TYLENOL) 325 MG tablet, Take 1-2 tablets (325-650 mg total) by mouth every  6 (six) hours as needed for mild pain or moderate pain. (Patient taking differently: Take 325 mg by mouth every 6 (six) hours as needed for mild pain or moderate pain. ), Disp: 40 tablet, Rfl: 0 .  amiodarone (PACERONE) 200 MG tablet, Take 1 tablet (200 mg total) by mouth daily. 1 TABLET TWICE DAILY X 2 WEEKS THEN 1 TABLET ONCE DAILY (Patient taking differently: Take 200 mg by mouth 2 (two) times daily. ), Disp: 74 tablet, Rfl: 6 .  apixaban (ELIQUIS) 5 MG TABS tablet, Take 5 mg by mouth 2 (two) times daily., Disp: , Rfl:  .  beta carotene w/minerals (OCUVITE) tablet, Take 1 tablet by mouth daily., Disp: , Rfl:  .  Carboxymethylcellulose Sodium (LUBRICANT EYE DROPS OP), Apply 1 drop to eye 2 (two) times daily as  needed (dry eyes)., Disp: , Rfl:  .  diltiazem (CARTIA XT) 120 MG 24 hr capsule, Take 2 capsules (240 mg total) by mouth daily., Disp: 90 capsule, Rfl: 3 .  ferrous sulfate 325 (65 FE) MG tablet, Take 1 tablet (325 mg total) by mouth 2 (two) times daily with a meal. (Patient taking differently: Take 325 mg by mouth daily with breakfast. ), Disp: 60 tablet, Rfl: 0 .  finasteride (PROSCAR) 5 MG tablet, Take 5 mg by mouth every morning. , Disp: , Rfl:  .  lovastatin (MEVACOR) 20 MG tablet, TAKE ONE TABLET BY MOUTH ONCE DAILY AT BEDTIME, Disp: 90 tablet, Rfl: 3 .  meclizine (ANTIVERT) 25 MG tablet, Take 1 tablet (25 mg total) by mouth 3 (three) times daily as needed for dizziness., Disp: 30 tablet, Rfl: 0 .  Multiple Vitamin (MULTIVITAMIN WITH MINERALS) TABS tablet, Take 1 tablet by mouth daily., Disp: , Rfl:  .  OVER THE COUNTER MEDICATION, Stool softner-As needed, Disp: , Rfl:  .  Probiotic Product (PROBIOTIC DAILY PO), Take 1 capsule by mouth daily., Disp: , Rfl:  .  Sunscreens (AVEENO ESSENTIAL MOISTURE EX), Apply 1 application topically daily., Disp: , Rfl:  .  traMADol (ULTRAM) 50 MG tablet, Take 1 tablet (50 mg total) by mouth every 6 (six) hours as needed for severe pain., Disp: 20 tablet, Rfl: 0  Objective: BP 130/70 (BP Location: Right Arm, Patient Position: Sitting, Cuff Size: Normal)   Pulse 65   Temp 97.4 F (36.3 C) (Oral)   Ht 5\' 5"  (1.651 m)   Wt 134 lb 6.4 oz (61 kg)   SpO2 98%   BMI 22.37 kg/m  General: Awake, appears stated age HEENT: MMM, EOMi Heart: RRR, 2/6 SEM, no LE edema Lungs: CTAB, no rales, wheezes or rhonchi. No accessory muscle use Abd: BS+, soft, NT, ND, no masses or organomegaly Neuro: Poor balance Psych: Age appropriate judgment and insight, normal affect and mood  Assessment and Plan: Hospital discharge follow-up - Plan: CBC, Comprehensive metabolic panel  Urinary frequency  Orders as above. Follow up on labs per D/C summary. Recommended stopping  alcohol consumption prior to bed. Suggested cutting out alcohol altogether or perhaps drinking during lunch. If he is still having issues, recommended bringing up at his urology appointment on 12/31/16. Follow-up as originally scheduled with regular PCP. The patient voiced understanding and agreement to the plan.  [place], DO 12/23/16  11:56 AM

## 2016-12-23 NOTE — Patient Instructions (Signed)
It may be reasonable to avoid alcohol so close to bedtime. This is likely the cause of your increased urination at night.   Let us know if you need anything.

## 2016-12-30 NOTE — Progress Notes (Signed)
HPI: FU CAD s/p CABG (2001) and atrial fibrillation. Monitor March 2016 showed sinus rhythm with PAF. ABIs 7/17 showed vessels noncompressible. Echo 4/18 showed EF 40-45, mild to moderate AS, moderate MR and severe LAE. Nuclear study 4/18 showed small area of ischemia in the mid anterior wall; treated medically. At last office visit patient was noted to be in recurrent atrial fibrillation following prostate surgery. Amiodarone was added to his medical regimen. Since last seen, patient has mild dyspnea on exertion but no orthopnea, PND, pedal edema, chest pain or syncope.  Current Outpatient Prescriptions  Medication Sig Dispense Refill  . acetaminophen (TYLENOL) 325 MG tablet Take 1-2 tablets (325-650 mg total) by mouth every 6 (six) hours as needed for mild pain or moderate pain. (Patient taking differently: Take 325 mg by mouth every 6 (six) hours as needed for mild pain or moderate pain. ) 40 tablet 0  . amiodarone (PACERONE) 200 MG tablet Take 200 mg by mouth daily.    [name] apixaban (ELIQUIS) 5 MG TABS tablet Take 5 mg by mouth 2 (two) times daily.    . beta carotene w/minerals (OCUVITE) tablet Take 1 tablet by mouth daily.    . Carboxymethylcellulose Sodium (LUBRICANT EYE DROPS OP) Apply 1 drop to eye 2 (two) times daily as needed (dry eyes).    [name] diltiazem (CARTIA XT) 120 MG 24 hr capsule Take 120 mg by mouth 2 (two) times daily.    . ferrous sulfate 325 (65 FE) MG tablet Take 325 mg by mouth daily with breakfast.    . finasteride (PROSCAR) 5 MG tablet Take 5 mg by mouth every morning.     . lovastatin (MEVACOR) 20 MG tablet TAKE ONE TABLET BY MOUTH ONCE DAILY AT BEDTIME 90 tablet 3  . meclizine (ANTIVERT) 25 MG tablet Take 1 tablet (25 mg total) by mouth 3 (three) times daily as needed for dizziness. 30 tablet 0  . Multiple Vitamin (MULTIVITAMIN WITH MINERALS) TABS tablet Take 1 tablet by mouth daily.    [name] OVER THE COUNTER MEDICATION Stool softner-As needed    . Probiotic Product  (PROBIOTIC DAILY PO) Take 1 capsule by mouth daily.    . traMADol (ULTRAM) 50 MG tablet Take 1 tablet (50 mg total) by mouth every 6 (six) hours as needed for severe pain. 20 tablet 0  . Sunscreens (AVEENO ESSENTIAL MOISTURE EX) Apply 1 application topically daily.     No current facility-administered medications for this visit.      Past Medical History:  Diagnosis Date  . Basal cell carcinoma   . BPH (benign prostatic hypertrophy)   . CAD (coronary artery disease) 1991   a. H/o MI w/ PTCA;  b. s/p CABG by Dr Roxan Hockey w/ LIMA-LAD, SVG-OM1-OM3, SVG-D1, SVG-PDA  . CHF (congestive heart failure) ([place])   . Complication of anesthesia    Wife reports history of confusion after last surgery in 3-17  . Disequilibrium 04/03/2012  . Dysrhythmia   . HTN (hypertension)   . Hyperlipidemia   . Internal hemorrhoids   . Medicare annual wellness visit, subsequent 03/16/2015   Follows with cardiology, Dr Stanford Breed, Follows with dermatology, Dr Altamese Cabal. Aged out of colonoscopy  . Moderate aortic stenosis    a. 08/2014 Echo: EF 55-60%, no rwma, Gr 1 DD, mod AS, mildly dil asc Ao, mild MR, sev dil LA.  [name] PAF (paroxysmal atrial fibrillation) ([place])    a. Dx 08/2014;  b. CHA2DS2VASc = 4 - decision made to withold [facility]  2/2 unsteady gait and syncope.  . Pneumonia    as a baby  . Sigmoid diverticulitis   . Syncope    a. 08/2014 - ? orthostatic    Past Surgical History:  Procedure Laterality Date  . COLONOSCOPY    . CORONARY ARTERY BYPASS GRAFT  2000   Dr Roxan Hockey  LIMA-LAD, SVG-OM1-OM3, SVG-D1, SVG-PDA.   Consuela Mimes N/A 12/07/2016   Procedure: CYSTOSCOPY;  Surgeon: Cleon Gustin, MD;  Location: [facility];  Service: Urology;  Laterality: N/A;  NEEDS 30 MIN TOTAL FOR SURGERY  . EYE SURGERY     bil cataract  . HIP ARTHROPLASTY Left 10/14/2015   Procedure: LEFT HIP HEMI ARTHROPLASTY;  Surgeon: [name], MD;  Location: [facility];  Service: Orthopedics;  Laterality: Left;  . SHOULDER SURGERY      x2  . TONSILLECTOMY    . TRANSURETHRAL RESECTION OF BLADDER NECK N/A 12/07/2016   Procedure: TRANSURETHRAL RESECTION OF BLADDER NECK;  Surgeon: Cleon Gustin, MD;  [facility];  Service: Urology;  Laterality: N/A;  . XI ROBOTIC ASSISTED SIMPLE PROSTATECTOMY N/A 11/04/2016   Procedure: XI ROBOTIC ASSISTED SIMPLE PROSTATECTOMY;  Surgeon: Cleon Gustin, MD;  [facility];  Service: Urology;  Laterality: N/A;    Social History   Social History  . Marital status: Married    Spouse name: N/A  . Number of children: 2  . Years of education: N/A   Occupational History  . 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 Smoking status: Former Smoker    Packs/day: 0.50    Years: 15.00    Quit date: 06/29/1971  . Smokeless tobacco: Never Used  . Alcohol use 0.6 - 1.2 oz/week    1 - 2 Glasses of wine per week     Comment: alternates wine and liquor. 1/2 pint liquor/week  . Drug use: No  . Sexual activity: No     Comment: lives with wife, retired English as a second language teacher, no dietary restrictions   Other Topics Concern  . Not on file   Social History Narrative  . No narrative on file    Family History  Problem Relation Age of Onset  . Coronary artery disease Father   . Hyperlipidemia Father   . Heart attack Father   . Alcohol abuse Father   . Bipolar disorder Mother   . Heart disease Mother   . Alcohol abuse Brother   . Cancer Brother   . Cancer Maternal Grandfather        oral cancer, chew tobacco  . Cancer Sister   . Glaucoma Sister   . Alcohol abuse Unknown     ROS: no fevers or chills, productive cough, hemoptysis, dysphasia, odynophagia, melena, hematochezia, dysuria, hematuria, rash, seizure activity, orthopnea, PND, pedal edema, claudication. Remaining systems are negative.  Physical Exam: Well-developed frail in no acute distress.  Skin is warm and dry.  HEENT is normal.  Neck is supple.  Chest is clear to  auscultation with normal expansion.  Cardiovascular exam is regular rate and rhythm. 3/6 systolic murmur Abdominal exam nontender or distended. No masses palpated. Extremities show no edema. neuro grossly intact  A/P  1 paroxysmal atrial fibrillation-patient is now back in sinus rhythm on exam. We will continue amiodarone 200 mg daily. Continue Cardizem. Continue apixaban. We will check TSH, liver functions, chest x-ray, hemoglobin and renal function in 6 months when he returns.   2 coronary artery disease-continue statin. No aspirin given need for  anticoagulation. He is not having chest pain and we will plan medical therapy.  3 hypertension-blood pressure is controlled. Continue present medications.  4 hyperlipidemia-continue statin.  5 mild to moderate aortic stenosis-plan follow-up echocardiograms in the future.  6 peripheral vascular disease-continue statin.   Kirk Ruths, MD

## 2016-12-31 DIAGNOSIS — N3000 Acute cystitis without hematuria: Secondary | ICD-10-CM | POA: Diagnosis not present

## 2016-12-31 DIAGNOSIS — N401 Enlarged prostate with lower urinary tract symptoms: Secondary | ICD-10-CM | POA: Diagnosis not present

## 2017-01-12 ENCOUNTER — Ambulatory Visit (INDEPENDENT_AMBULATORY_CARE_PROVIDER_SITE_OTHER): Payer: PPO | Admitting: Cardiology

## 2017-01-12 ENCOUNTER — Encounter: Payer: Self-pay | Admitting: Cardiology

## 2017-01-12 VITALS — BP 134/80 | HR 69 | Ht 65.0 in | Wt 136.0 lb

## 2017-01-12 DIAGNOSIS — E7800 Pure hypercholesterolemia, unspecified: Secondary | ICD-10-CM | POA: Diagnosis not present

## 2017-01-12 DIAGNOSIS — I10 Essential (primary) hypertension: Secondary | ICD-10-CM

## 2017-01-12 DIAGNOSIS — I350 Nonrheumatic aortic (valve) stenosis: Secondary | ICD-10-CM

## 2017-01-12 DIAGNOSIS — I2510 Atherosclerotic heart disease of native coronary artery without angina pectoris: Secondary | ICD-10-CM

## 2017-01-12 DIAGNOSIS — I480 Paroxysmal atrial fibrillation: Secondary | ICD-10-CM

## 2017-01-12 NOTE — Patient Instructions (Signed)
Your physician wants you to follow-up in: 6 MONTHS WITH DR CRENSHAW You will receive a reminder letter in the mail two months in advance. If you don't receive a letter, please call our office to schedule the follow-up appointment.   If you need a refill on your cardiac medications before your next appointment, please call your pharmacy.  

## 2017-01-20 ENCOUNTER — Other Ambulatory Visit: Payer: Self-pay | Admitting: Cardiology

## 2017-01-20 MED ORDER — DILTIAZEM HCL ER COATED BEADS 120 MG PO CP24: 120.0000 mg | ORAL_CAPSULE | Freq: Two times a day (BID) | ORAL | 3 refills | Status: DC

## 2017-01-20 NOTE — Telephone Encounter (Signed)
Spoke with patients wife and she stated patient will run out of meds on Saturday and wanted a Diltiazem rx  sent to [facility]. Rx sent to pharmacy and patients wife notified directly.

## 2017-01-20 NOTE — Telephone Encounter (Signed)
Pt's wife is calling requesting a refill on diltiazem 120 mg tablet. Would like for refill to be sent to [facility] on [address]. Please address. Thank you

## 2017-02-02 ENCOUNTER — Other Ambulatory Visit: Payer: Self-pay | Admitting: Cardiology

## 2017-02-04 ENCOUNTER — Telehealth: Payer: Self-pay | Admitting: Cardiology

## 2017-02-04 MED ORDER — APIXABAN 5 MG PO TABS: 5.0000 mg | ORAL_TABLET | Freq: Two times a day (BID) | ORAL | 3 refills | Status: DC

## 2017-02-04 MED ORDER — LOVASTATIN 20 MG PO TABS: 20.0000 mg | ORAL_TABLET | Freq: Every day | ORAL | 3 refills | Status: DC

## 2017-02-04 NOTE — Telephone Encounter (Signed)
apixaban (ELIQUIS) 5 MG TABS tablet 180 tablet 3 02/04/2017   Sig - Route:  Take 1 tablet (5 mg total) by mouth 2 (two) times daily. - Oral  Class:  Normal  DAW:  No  Authorizing Provider:  Lelon Perla, MD  Ordering User:  Cristopher Estimable, RN  Visit Pharmacy   [place], [place] - [address]

## 2017-02-04 NOTE — Telephone Encounter (Signed)
°*  STAT* If patient is at the pharmacy, call can be transferred to refill team.   1. Which medications need to be refilled? (please list name of each medication and dose if known) Eliquis 5 mg and Lovastatin 20 mg  2. Which pharmacy/location (including street and city if local pharmacy) is medication to be sent to? [place], [address], [place], [place]  3. Do they need a 30 day or 90 day supply? [place]

## 2017-02-04 NOTE — Telephone Encounter (Signed)
Refill sent to the pharmacy electronically.  

## 2017-02-04 NOTE — Telephone Encounter (Signed)
Followed at [facility] 

## 2017-02-17 ENCOUNTER — Other Ambulatory Visit: Payer: Self-pay | Admitting: Urology

## 2017-02-17 ENCOUNTER — Encounter (HOSPITAL_BASED_OUTPATIENT_CLINIC_OR_DEPARTMENT_OTHER): Payer: Self-pay | Admitting: *Deleted

## 2017-02-17 DIAGNOSIS — N401 Enlarged prostate with lower urinary tract symptoms: Secondary | ICD-10-CM | POA: Diagnosis not present

## 2017-02-17 DIAGNOSIS — R3912 Poor urinary stream: Secondary | ICD-10-CM | POA: Diagnosis not present

## 2017-02-17 NOTE — Progress Notes (Signed)
REVIEWING CHART PRE-OPERATIVELY FOR SURGERY.  NOTED IN CARDIOLOGIST NOTE, DR CRENSHAW, PT HAD MODERATE AORTIC STENOSIS.  LAST ECHO 10-06-2016, AORTIC VALVE AREA 0.61 cm^2 THIS IS BELOW 1.0 ANESTHESIA GUIDELINES.  CALLED AND LM FOR [facility] VIA PHONE , Emily.  PT WILL NEED TO BE DONE AT MAIN OR.

## 2017-02-17 NOTE — Addendum Note (Signed)
Addendum  created 02/17/17 1421 by Zari Cly, MD   Sign clinical note    

## 2017-02-18 ENCOUNTER — Encounter (HOSPITAL_BASED_OUTPATIENT_CLINIC_OR_DEPARTMENT_OTHER): Payer: Self-pay | Admitting: *Deleted

## 2017-02-18 NOTE — Progress Notes (Signed)
Reviewed instructions for surgery on 02/21/2017 with patient and patient's wife  Via phone with patient and wife verbalizing understanding.

## 2017-02-18 NOTE — Progress Notes (Signed)
Spoke with Dr. Lyn Hollingshead, Anesthesia about medical history on patient. Dr. Jillyn Hidden reviewed EKG from 11/26/2016,ECHO from 10/06/2016,Stress test from 10/01/2016, and last progress note from Dr. Stanford Breed on 01/12/2017 and does not need a Cardiac Clearance for Anesthesia for surgery. Per Dr. Jillyn Hidden, patient is OK to have surgery.

## 2017-02-21 ENCOUNTER — Encounter (HOSPITAL_COMMUNITY)
Admission: RE | Disposition: A | Discharge status: Home / Self Care | Payer: Self-pay | Source: Ambulatory Visit | Attending: Urology

## 2017-02-21 ENCOUNTER — Encounter (HOSPITAL_COMMUNITY): Payer: Self-pay | Admitting: *Deleted

## 2017-02-21 ENCOUNTER — Ambulatory Visit (HOSPITAL_COMMUNITY): Payer: PPO | Admitting: Anesthesiology

## 2017-02-21 ENCOUNTER — Ambulatory Visit (HOSPITAL_COMMUNITY)
Admission: RE | Admit: 2017-02-21 | Discharge: 2017-02-21 | Disposition: A | Discharge status: Home / Self Care | Payer: PPO | Source: Ambulatory Visit | Attending: Urology | Admitting: Urology

## 2017-02-21 DIAGNOSIS — Z955 Presence of coronary angioplasty implant and graft: Secondary | ICD-10-CM | POA: Diagnosis not present

## 2017-02-21 DIAGNOSIS — I10 Essential (primary) hypertension: Secondary | ICD-10-CM | POA: Diagnosis not present

## 2017-02-21 DIAGNOSIS — I2510 Atherosclerotic heart disease of native coronary artery without angina pectoris: Secondary | ICD-10-CM | POA: Insufficient documentation

## 2017-02-21 DIAGNOSIS — Z87891 Personal history of nicotine dependence: Secondary | ICD-10-CM | POA: Diagnosis not present

## 2017-02-21 DIAGNOSIS — N320 Bladder-neck obstruction: Secondary | ICD-10-CM | POA: Diagnosis not present

## 2017-02-21 DIAGNOSIS — N401 Enlarged prostate with lower urinary tract symptoms: Secondary | ICD-10-CM

## 2017-02-21 DIAGNOSIS — I110 Hypertensive heart disease with heart failure: Secondary | ICD-10-CM | POA: Insufficient documentation

## 2017-02-21 DIAGNOSIS — Z79899 Other long term (current) drug therapy: Secondary | ICD-10-CM | POA: Insufficient documentation

## 2017-02-21 DIAGNOSIS — I509 Heart failure, unspecified: Secondary | ICD-10-CM | POA: Diagnosis not present

## 2017-02-21 DIAGNOSIS — Z951 Presence of aortocoronary bypass graft: Secondary | ICD-10-CM | POA: Diagnosis not present

## 2017-02-21 DIAGNOSIS — Z85828 Personal history of other malignant neoplasm of skin: Secondary | ICD-10-CM | POA: Diagnosis not present

## 2017-02-21 DIAGNOSIS — R338 Other retention of urine: Secondary | ICD-10-CM | POA: Diagnosis not present

## 2017-02-21 DIAGNOSIS — N138 Other obstructive and reflux uropathy: Secondary | ICD-10-CM | POA: Diagnosis not present

## 2017-02-21 DIAGNOSIS — I252 Old myocardial infarction: Secondary | ICD-10-CM | POA: Diagnosis not present

## 2017-02-21 DIAGNOSIS — E785 Hyperlipidemia, unspecified: Secondary | ICD-10-CM | POA: Insufficient documentation

## 2017-02-21 HISTORY — PX: CYSTOSCOPY WITH URETHRAL DILATATION: SHX5125

## 2017-02-21 LAB — CBC
HCT: 40.4 % (ref 39.0–52.0)
HEMOGLOBIN: 13.5 g/dL (ref 13.0–17.0)
MCH: 28.3 pg (ref 26.0–34.0)
MCHC: 33.4 g/dL (ref 30.0–36.0)
MCV: 84.7 fL (ref 78.0–100.0)
PLATELETS: 348 10*3/uL (ref 150–400)
RBC: 4.77 MIL/uL (ref 4.22–5.81)
RDW: 17.6 % — AB (ref 11.5–15.5)
WBC: 11.7 10*3/uL — ABNORMAL HIGH (ref 4.0–10.5)

## 2017-02-21 LAB — BASIC METABOLIC PANEL
Anion gap: 9 (ref 5–15)
BUN: 18 mg/dL (ref 6–20)
CALCIUM: 8.6 mg/dL — AB (ref 8.9–10.3)
CO2: 24 mmol/L (ref 22–32)
CREATININE: 0.88 mg/dL (ref 0.61–1.24)
Chloride: 105 mmol/L (ref 101–111)
Glucose, Bld: 90 mg/dL (ref 65–99)
Potassium: 3.8 mmol/L (ref 3.5–5.1)
SODIUM: 138 mmol/L (ref 135–145)

## 2017-02-21 SURGERY — CYSTOSCOPY, WITH URETHRAL DILATION
Anesthesia: General | Site: Bladder

## 2017-02-21 MED ORDER — SODIUM CHLORIDE 0.9 % IR SOLN
Status: DC | PRN
  Administered 2017-02-21: 3000 mL

## 2017-02-21 MED ORDER — ONDANSETRON HCL 4 MG/2ML IJ SOLN
INTRAMUSCULAR | Status: AC
  Filled 2017-02-21: qty 2

## 2017-02-21 MED ORDER — PROPOFOL 10 MG/ML IV BOLUS
INTRAVENOUS | Status: DC | PRN
  Administered 2017-02-21: 20 mg via INTRAVENOUS
  Administered 2017-02-21: 110 mg via INTRAVENOUS

## 2017-02-21 MED ORDER — FENTANYL CITRATE (PF) 100 MCG/2ML IJ SOLN
INTRAMUSCULAR | Status: DC | PRN
  Administered 2017-02-21 (×2): 25 ug via INTRAVENOUS

## 2017-02-21 MED ORDER — MITOMYCIN CHEMO FOR BLADDER INSTILLATION 40 MG: 1.5000 mg | Freq: Once | INTRAVENOUS | Status: DC

## 2017-02-21 MED ORDER — STERILE WATER FOR IRRIGATION IR SOLN
Status: DC | PRN
  Administered 2017-02-21: 2000 mL

## 2017-02-21 MED ORDER — ONDANSETRON HCL 4 MG/2ML IJ SOLN
INTRAMUSCULAR | Status: DC | PRN
Start: 2017-02-21 — End: 2017-02-21
  Administered 2017-02-21: 4 mg via INTRAVENOUS

## 2017-02-21 MED ORDER — MITOMYCIN CHEMO FOR BLADDER INSTILLATION 20 MG
3.0000 mg | Freq: Once | INTRAVENOUS | Status: DC
  Filled 2017-02-21: qty 3

## 2017-02-21 MED ORDER — LACTATED RINGERS IV SOLN
INTRAVENOUS | Status: DC
  Administered 2017-02-21: 15:00:00 via INTRAVENOUS

## 2017-02-21 MED ORDER — DEXAMETHASONE SODIUM PHOSPHATE 10 MG/ML IJ SOLN
INTRAMUSCULAR | Status: DC | PRN
  Administered 2017-02-21: 10 mg via INTRAVENOUS

## 2017-02-21 MED ORDER — MITOMYCIN CHEMO IV INJECTION 5 MG
1.5000 mg | Freq: Once | INTRAVENOUS | Status: DC
  Filled 2017-02-21: qty 3

## 2017-02-21 MED ORDER — FENTANYL CITRATE (PF) 100 MCG/2ML IJ SOLN: 25.0000 ug | INTRAMUSCULAR | Status: DC | PRN

## 2017-02-21 MED ORDER — LIDOCAINE 2% (20 MG/ML) 5 ML SYRINGE
INTRAMUSCULAR | Status: AC
  Filled 2017-02-21: qty 5

## 2017-02-21 MED ORDER — FENTANYL CITRATE (PF) 100 MCG/2ML IJ SOLN
INTRAMUSCULAR | Status: AC
Start: 2017-02-21 — End: ?
  Filled 2017-02-21: qty 2

## 2017-02-21 MED ORDER — 0.9 % SODIUM CHLORIDE (POUR BTL) OPTIME
TOPICAL | Status: DC | PRN
  Administered 2017-02-21: 1000 mL

## 2017-02-21 MED ORDER — CEFAZOLIN SODIUM-DEXTROSE 2-4 GM/100ML-% IV SOLN
2.0000 g | INTRAVENOUS | Status: AC
  Administered 2017-02-21: 2 g via INTRAVENOUS
  Filled 2017-02-21: qty 100

## 2017-02-21 MED ORDER — EPHEDRINE SULFATE 50 MG/ML IJ SOLN
INTRAMUSCULAR | Status: DC | PRN
  Administered 2017-02-21 (×2): 10 mg via INTRAVENOUS

## 2017-02-21 MED ORDER — LIDOCAINE 2% (20 MG/ML) 5 ML SYRINGE
INTRAMUSCULAR | Status: DC | PRN
  Administered 2017-02-21: 60 mg via INTRAVENOUS

## 2017-02-21 MED ORDER — TRAMADOL HCL 50 MG PO TABS: 50.0000 mg | ORAL_TABLET | Freq: Four times a day (QID) | ORAL | 0 refills | Status: DC | PRN

## 2017-02-21 MED ORDER — DEXAMETHASONE SODIUM PHOSPHATE 10 MG/ML IJ SOLN
INTRAMUSCULAR | Status: AC
  Filled 2017-02-21: qty 1

## 2017-02-21 MED ORDER — PROPOFOL 10 MG/ML IV BOLUS
INTRAVENOUS | Status: AC
  Filled 2017-02-21: qty 20

## 2017-02-21 MED ORDER — PROMETHAZINE HCL 25 MG/ML IJ SOLN: 6.2500 mg | INTRAMUSCULAR | Status: DC | PRN

## 2017-02-21 SURGICAL SUPPLY — 28 items
BAG URINE DRAINAGE (UROLOGICAL SUPPLIES) ×6 IMPLANT
BALLN NEPHROSTOMY (BALLOONS)
BALLOON NEPHROSTOMY (BALLOONS) IMPLANT
CATH FOLEY 2W COUNCIL 20FR 5CC (CATHETERS) IMPLANT
CATH FOLEY 2WAY SLVR 30CC 22FR (CATHETERS) ×3 IMPLANT
CATH INTERMIT  6FR 70CM (CATHETERS) IMPLANT
CATH ROBINSON RED A/P 14FR (CATHETERS) IMPLANT
CATH URET 5FR 28IN CONE TIP (BALLOONS)
CATH URET 5FR 70CM CONE TIP (BALLOONS) IMPLANT
CLOTH BEACON ORANGE TIMEOUT ST (SAFETY) ×3 IMPLANT
COVER FOOTSWITCH UNIV (MISCELLANEOUS) ×3 IMPLANT
COVER SURGICAL LIGHT HANDLE (MISCELLANEOUS) ×3 IMPLANT
ELECT KNIFE MONO 22-24F 12/30D (ELECTROSURGICAL) ×3
ELECTRODE KNF MON 22-24F 12/30 (ELECTROSURGICAL) ×1 IMPLANT
GLOVE BIO SURGEON STRL SZ7.5 (GLOVE) ×6 IMPLANT
GOWN STRL REUS W/TWL XL LVL3 (GOWN DISPOSABLE) ×6 IMPLANT
GUIDEWIRE .038 (WIRE) IMPLANT
GUIDEWIRE ANG ZIPWIRE 038X150 (WIRE) IMPLANT
GUIDEWIRE STR DUAL SENSOR (WIRE) IMPLANT
IV NS IRRIG 3000ML ARTHROMATIC (IV SOLUTION) ×3 IMPLANT
MANIFOLD NEPTUNE II (INSTRUMENTS) ×3 IMPLANT
NS IRRIG 1000ML POUR BTL (IV SOLUTION) ×3 IMPLANT
PACK CYSTO (CUSTOM PROCEDURE TRAY) ×3 IMPLANT
PLUG CATH AND CAP STER (CATHETERS) ×3 IMPLANT
SYR 10ML LL (SYRINGE) ×3 IMPLANT
TUBING INSUFFLATION 10FT LAP (TUBING) IMPLANT
WATER STERILE IRR 1000ML UROMA (IV SOLUTION) ×6 IMPLANT
WATER STERILE IRR 3000ML UROMA (IV SOLUTION) IMPLANT

## 2017-02-21 NOTE — H&P (Signed)
Urology Admission H&P  Chief Complaint: urinary retention  History of Present Illness: Justin [name] is a 83yo with a hx of BPH s/p simple prostatectomy who developed a bladder neck contracture  Past Medical History:  Diagnosis Date  . Basal cell carcinoma   . BPH (benign prostatic hypertrophy)   . CAD (coronary artery disease) 1991   a. H/o MI w/ PTCA;  b. s/p CABG by Dr Roxan Hockey w/ LIMA-LAD, SVG-OM1-OM3, SVG-D1, SVG-PDA  . CHF (congestive heart failure) ([place])   . Complication of anesthesia    Wife reports history of confusion after last surgery in 10/2016-lasted a few days  . Disequilibrium 04/03/2012  . Dysrhythmia   . HTN (hypertension)   . Hyperlipidemia   . Internal hemorrhoids   . Medicare annual wellness visit, subsequent 03/16/2015   Follows with cardiology, Dr Stanford Breed, Follows with dermatology, Dr Altamese Cabal. Aged out of colonoscopy  . Moderate aortic stenosis    a. 08/2014 Echo: EF 55-60%, no rwma, Gr 1 DD, mod AS, mildly dil asc Ao, mild Justin, sev dil LA.  [name] PAF (paroxysmal atrial fibrillation) ([place])    a. Dx 08/2014;  b. CHA2DS2VASc = 4 - decision made to withold [place] 2/2 unsteady gait and syncope.  . Pneumonia    as a baby  . Sigmoid diverticulitis   . Syncope    a. 08/2014 - ? orthostatic   Past Surgical History:  Procedure Laterality Date  . COLONOSCOPY    . CORONARY ARTERY BYPASS GRAFT  2000   Dr Roxan Hockey  LIMA-LAD, SVG-OM1-OM3, SVG-D1, SVG-PDA.   Consuela Mimes N/A 12/07/2016   Procedure: CYSTOSCOPY;  Surgeon: Cleon Gustin, MD;  [facility];  Service: Urology;  Laterality: N/A;  NEEDS 30 MIN TOTAL FOR SURGERY  . EYE SURGERY     bil cataract  . HIP ARTHROPLASTY Left 10/14/2015   Procedure: LEFT HIP HEMI ARTHROPLASTY;  Surgeon: [name], MD;  Location: [facility];  Service: Orthopedics;  Laterality: Left;  . SHOULDER SURGERY     x2  . TONSILLECTOMY    . TRANSTHORACIC ECHOCARDIOGRAM  10-06-2016   dr Stanford Breed   mild LVH, ef 40-45%, diffuse hypokinesis/  moderate AV calcification leaflets and annulus,  severe thickened leaflets, mod.-sev. aortic stenosis (Valva area 0.61cm^2, mean grandient 6mmHg, peak gradiant 48mmHg)/  moderate thickened , mild calcified MV leaflets without stenosis, moderate Justin/  severe LAE/ trivial PR and TR  . TRANSURETHRAL RESECTION OF BLADDER NECK N/A 12/07/2016   Procedure: TRANSURETHRAL RESECTION OF BLADDER NECK;  Surgeon: Cleon Gustin, MD;  [facility];  Service: Urology;  Laterality: N/A;  . XI ROBOTIC ASSISTED SIMPLE PROSTATECTOMY N/A 11/04/2016   Procedure: XI ROBOTIC ASSISTED SIMPLE PROSTATECTOMY;  Surgeon: Cleon Gustin, MD;  [facility];  Service: Urology;  Laterality: N/A;    Home Medications:  Prescriptions Prior to Admission  Medication Sig Dispense Refill Last Dose  . acetaminophen (TYLENOL) 500 MG tablet Take 500 mg by mouth daily as needed for moderate pain.   Past Month at Unknown time  . amiodarone (PACERONE) 200 MG tablet Take 200 mg by mouth daily.   02/21/2017 at 0800  . apixaban (ELIQUIS) 5 MG TABS tablet Take 1 tablet (5 mg total) by mouth 2 (two) times daily. 180 tablet 3 02/18/2017  . beta carotene w/minerals (OCUVITE) tablet Take 1 tablet by mouth daily.   Past Week at Unknown time  . Carboxymethylcellulose Sodium (THERATEARS OP) Apply 1 drop to eye daily as needed (dry eyes).  Past Week at Unknown time  . diltiazem (CARTIA XT) 120 MG 24 hr capsule Take 1 capsule (120 mg total) by mouth 2 (two) times daily. 180 capsule 3 02/21/2017 at 0800  . docusate sodium (COLACE) 100 MG capsule Take 100 mg by mouth 2 (two) times daily as needed for mild constipation.   Past Week at Unknown time  . ferrous sulfate 325 (65 FE) MG tablet Take 325 mg by mouth 2 (two) times daily.    Past Week at Unknown time  . finasteride (PROSCAR) 5 MG tablet Take 5 mg by mouth every morning.    02/20/2017 at Unknown time  . lovastatin (MEVACOR) 20 MG tablet Take 1 tablet (20 mg total) by mouth daily with  breakfast. 90 tablet 3 02/21/2017 at 0800  . Multiple Vitamin (MULTIVITAMIN WITH MINERALS) TABS tablet Take 1 tablet by mouth daily.   Past Week at Unknown time  . Probiotic Product (PROBIOTIC DAILY PO) Take 1 capsule by mouth daily.   Past Week at Unknown time  . meclizine (ANTIVERT) 25 MG tablet Take 1 tablet (25 mg total) by mouth 3 (three) times daily as needed for dizziness. 30 tablet 0 More than a month at Unknown time  . traMADol (ULTRAM) 50 MG tablet Take 1 tablet (50 mg total) by mouth every 6 (six) hours as needed for severe pain. (Patient not taking: Reported on 02/18/2017) 20 tablet 0 Completed Course at Unknown time   Allergies:  Allergies  Allergen Reactions  . Toradol [Ketorolac Tromethamine] Other (See Comments)    Dizzy and light headed  . Hydrocodone Other (See Comments)    hallucinations  . Dutasteride Diarrhea    GI upset and incontinence    Family History  Problem Relation Age of Onset  . Coronary artery disease Father   . Hyperlipidemia Father   . Heart attack Father   . Alcohol abuse Father   . Bipolar disorder Mother   . Heart disease Mother   . Alcohol abuse Brother   . Cancer Brother   . Cancer Maternal Grandfather        oral cancer, chew tobacco  . Cancer Sister   . Glaucoma Sister   . Alcohol abuse Unknown    Social History:  reports that he quit smoking about 45 years ago. He has a 7.50 pack-year smoking history. He has never used smokeless tobacco. He reports that he drinks about 0.6 - 1.2 oz of alcohol per week . He reports that he does not use drugs.  Review of Systems  Genitourinary: Positive for dysuria, frequency and urgency.  All other systems reviewed and are negative.   Physical Exam:  Vital signs in last 24 hours: Temp:  [98 F (36.7 C)] 98 F (36.7 C) (08/27 1437) Pulse Rate:  [60] 60 (08/27 1437) Resp:  [18] 18 (08/27 1437) BP: (177)/(79) 177/79 (08/27 1437) SpO2:  [98 %] 98 % (08/27 1437) Weight:  [61.2 kg (135 lb)] 61.2 kg  (135 lb) (08/27 1437) Physical Exam  Constitutional: He is oriented to person, place, and time. He appears well-developed and well-nourished.  HENT:  Head: Normocephalic and atraumatic.  Eyes: Pupils are equal, round, and reactive to light. EOM are normal.  Neck: Normal range of motion. No thyromegaly present.  Cardiovascular: Normal rate and regular rhythm.   Respiratory: Effort normal. No respiratory distress.  GI: Soft. He exhibits no distension.  Musculoskeletal: Normal range of motion. He exhibits no edema.  Neurological: He is alert and oriented to person,  place, and time.  Skin: Skin is warm and dry.  Psychiatric: He has a normal mood and affect. His behavior is normal. Judgment and thought content normal.    Laboratory Data:  Results for orders placed or performed during the hospital encounter of 02/21/17 (from the past 24 hour(s))  CBC     Status: Abnormal   Collection Time: 02/21/17  2:30 PM  Result Value Ref Range   WBC 11.7 (H) 4.0 - 10.5 K/uL   RBC 4.77 4.22 - 5.81 MIL/uL   Hemoglobin 13.5 13.0 - 17.0 g/dL   HCT 40.4 39.0 - 52.0 %   MCV 84.7 78.0 - 100.0 fL   MCH 28.3 26.0 - 34.0 pg   MCHC 33.4 30.0 - 36.0 g/dL   RDW 17.6 (H) 11.5 - 15.5 %   Platelets 348 150 - 400 K/uL  Basic metabolic panel     Status: Abnormal   Collection Time: 02/21/17  3:30 PM  Result Value Ref Range   Sodium 138 135 - 145 mmol/L   Potassium 3.8 3.5 - 5.1 mmol/L   Chloride 105 101 - 111 mmol/L   CO2 24 22 - 32 mmol/L   Glucose, Bld 90 65 - 99 mg/dL   BUN 18 6 - 20 mg/dL   Creatinine, Ser 0.88 0.61 - 1.24 mg/dL   Calcium 8.6 (L) 8.9 - 10.3 mg/dL   GFR calc non Af Amer >60 >60 mL/min   GFR calc Af Amer >60 >60 mL/min   Anion gap 9 5 - 15   No results found for this or any previous visit (from the past 240 hour(s)). Creatinine:  Recent Labs  02/21/17 1530  CREATININE 0.88   Baseline Creatinine: 0.9  Impression/Assessment:  83yo with a bladder neck contracture  Plan:  The  risks/benefits/alterantives to bladder neck resection and mitomycin installation was explained to the patient and he understands and wishes to proceed with surgery  Justin [name] 02/21/2017, 4:32 PM

## 2017-02-21 NOTE — Brief Op Note (Signed)
02/21/2017  5:59 PM  PATIENT:  Justin [name]  83 y.o. male  PRE-OPERATIVE DIAGNOSIS:  BLADDER NECK CONTRACTURE  POST-OPERATIVE DIAGNOSIS:  Bladder neck contracture  PROCEDURE:  Procedure(s) with comments: DILATATION OF BLADDER NECK WITH INJECTION OF MITOMYCIN C (N/A) - 30 MINS [phone] [employer] [id number]  SURGEON:  Surgeon(s) and Role:    * Miryam Mcelhinney, Candee Furbish, MD - Primary  PHYSICIAN ASSISTANT:   ASSISTANTS: none   ANESTHESIA:   general  EBL:  No intake/output data recorded.  BLOOD ADMINISTERED:none  DRAINS: Urinary Catheter (Foley)   LOCAL MEDICATIONS USED:  NONE  SPECIMEN:  No Specimen  DISPOSITION OF SPECIMEN:  N/A  COUNTS:  YES  TOURNIQUET:  * No tourniquets in log *  DICTATION: .Note written in EPIC  PLAN OF CARE: Discharge to home after PACU  PATIENT DISPOSITION:  PACU - hemodynamically stable.   Delay start of Pharmacological VTE agent (>24hrs) due to surgical blood loss or risk of bleeding: not applicable

## 2017-02-21 NOTE — Anesthesia Preprocedure Evaluation (Signed)
Anesthesia Evaluation  Patient identified by MRN, date of birth, ID band Patient awake    Reviewed: Allergy & Precautions, NPO status , Patient's Chart, lab work & pertinent test results  Airway Mallampati: II  TM Distance: >3 FB Neck ROM: Limited    Dental no notable dental hx.    Pulmonary neg pulmonary ROS, former smoker,    Pulmonary exam normal breath sounds clear to auscultation       Cardiovascular hypertension, + CAD and + CABG  + dysrhythmias Atrial Fibrillation + Valvular Problems/Murmurs AS  Rhythm:Regular Rate:Normal + Systolic murmurs    Neuro/Psych negative neurological ROS  negative psych ROS   GI/Hepatic negative GI ROS, Neg liver ROS,   Endo/Other  negative endocrine ROS  Renal/GU negative Renal ROS  negative genitourinary   Musculoskeletal negative musculoskeletal ROS (+)   Abdominal   Peds negative pediatric ROS (+)  Hematology negative hematology ROS (+)   Anesthesia Other Findings   Reproductive/Obstetrics negative OB ROS                             Anesthesia Physical Anesthesia Plan  ASA: III  Anesthesia Plan: General   Post-op Pain Management:    Induction: Intravenous  PONV Risk Score and Plan: 2 and Ondansetron and Dexamethasone  Airway Management Planned: LMA  Additional Equipment:   Intra-op Plan:   Post-operative Plan: Extubation in OR  Informed Consent: I have reviewed the patients History and Physical, chart, labs and discussed the procedure including the risks, benefits and alternatives for the proposed anesthesia with the patient or authorized representative who has indicated his/her understanding and acceptance.   Dental advisory given  Plan Discussed with: CRNA and Surgeon  Anesthesia Plan Comments:         Anesthesia Quick Evaluation

## 2017-02-21 NOTE — Discharge Instructions (Addendum)
Indwelling Urinary Catheter Care, Adult  Take good care of your catheter to keep it working and to prevent problems.  How to wear your catheter  Attach your catheter to your leg with tape (adhesive tape) or a leg strap. Make sure it is not too tight. If you use tape, remove any bits of tape that are already on the catheter.  How to wear a drainage bag  You should have:   A large overnight bag.   A small leg bag.    Overnight Bag  You may wear the overnight bag at any time. Always keep the bag below the level of your bladder but off the floor. When you sleep, put a clean plastic bag in a wastebasket. Then hang the bag inside the wastebasket.  Leg Bag  Never wear the leg bag at night. Always wear the leg bag below your knee. Keep the leg bag secure with a leg strap or tape.  How to care for your skin   Clean the skin around the catheter at least once every day.   Shower every day. Do not take baths.   Put creams, lotions, or ointments on your genital area only as told by your doctor.   Do not use powders, sprays, or lotions on your genital area.  How to clean your catheter and your skin  1. Wash your hands with soap and water.  2. Wet a washcloth in warm water and gentle (mild) soap.  3. Use the washcloth to clean the skin where the catheter enters your body. Clean downward and wipe away from the catheter in small circles. Do not wipe toward the catheter.  4. Pat the area dry with a clean towel. Make sure to clean off all soap.  How to care for your drainage bags  Empty your drainage bag when it is ?- full or at least 2-3 times a day. Replace your drainage bag once a month or sooner if it starts to smell bad or look dirty. Do not clean your drainage bag unless told by your doctor.  Emptying a drainage bag    Supplies Needed   Rubbing alcohol.   Gauze pad or cotton ball.   Tape or a leg strap.    Steps  1. Wash your hands with soap and water.  2. Separate (detach) the bag from your leg.  3. Hold the bag over  the toilet or a clean container. Keep the bag below your hips and bladder. This stops pee (urine) from going back into the tube.  4. Open the pour spout at the bottom of the bag.  5. Empty the pee into the toilet or container. Do not let the pour spout touch any surface.  6. Put rubbing alcohol on a gauze pad or cotton ball.  7. Use the gauze pad or cotton ball to clean the pour spout.  8. Close the pour spout.  9. Attach the bag to your leg with tape or a leg strap.  10. Wash your hands.    Changing a drainage bag  Supplies Needed   Alcohol wipes.   A clean drainage bag.   Adhesive tape or a leg strap.    Steps  1. Wash your hands with soap and water.  2. Separate the dirty bag from your leg.  3. Pinch the rubber catheter with your fingers so that pee does not spill out.  4. Separate the catheter tube from the drainage tube where these tubes connect (at the   connection valve). Do not let the tubes touch any surface.  5. Clean the end of the catheter tube with an alcohol wipe. Use a different alcohol wipe to clean the end of the drainage tube.  6. Connect the catheter tube to the drainage tube of the clean bag.  7. Attach the new bag to the leg with adhesive tape or a leg strap.  8. Wash your hands.    How to prevent infection and other problems   Never pull on your catheter or try to remove it. Pulling can damage tissue in your body.   Always wash your hands before and after touching your catheter.   If a leg strap gets wet, replace it with a dry one.   Drink enough fluids to keep your pee clear or pale yellow, or as told by your doctor.   Do not let the drainage bag or tubing touch the floor.   Wear cotton underwear.   If you are male, wipe from front to back after you poop (have a bowel movement).   Check on the catheter often to make sure it works and the tubing is not twisted.  Get help if:   Your pee is cloudy.   Your pee smells unusually bad.   Your pee is not draining into the bag.   Your  tube gets clogged.   Your catheter starts to leak.   Your bladder feels full.  Get help right away if:   You have redness, swelling, or pain where the catheter enters your body.   You have fluid, pus, or a bad smell coming from the area where the catheter enters your body.   The area where the catheter enters your body feels warm.   You have a fever.   You have pain in your:  ? Stomach (abdomen).  ? Legs.  ? Lower back.  ? Bladder.   You see blood fill the catheter.   Your pee is pink or red.   You feel sick to your stomach (nauseous).   You throw up (vomit).   You have chills.   Your catheter gets pulled out.  This information is not intended to replace advice given to you by your health care provider. Make sure you discuss any questions you have with your health care provider.  Document Released: 10/09/2012 Document Revised: 05/12/2016 Document Reviewed: 11/27/2013  Elsevier Interactive Patient Education  2018 [employer]

## 2017-02-21 NOTE — Transfer of Care (Signed)
Immediate Anesthesia Transfer of Care Note  Patient: Justin [name]  Procedure(s) Performed: Procedure(s) with comments: DILATATION OF BLADDER NECK WITH INJECTION OF MITOMYCIN C (N/A) - 36 MINS [phone] [id number]  Patient Location: PACU  Anesthesia Type:General  Level of Consciousness:  sedated, patient cooperative and responds to stimulation  Airway & Oxygen Therapy:Patient Spontanous Breathing and Patient connected to face mask oxgen  Post-op Assessment:  Report given to PACU RN and Post -op Vital signs reviewed and stable  Post vital signs:  Reviewed and stable  Last Vitals:  Vitals:   02/21/17 1815 02/21/17 1817  BP: (!) (P) 175/104 (!) (P) 170/83  Pulse: (P) 61   Resp: (P) 15   Temp: (P) 36.6 C   SpO2: (P) 503%     Complications: No apparent anesthesia complications

## 2017-02-21 NOTE — Anesthesia Procedure Notes (Signed)
Procedure Name: LMA Insertion Date/Time: 02/21/2017 5:04 PM Performed by: West Pugh Pre-anesthesia Checklist: Patient identified, Emergency Drugs available, Suction available and Patient being monitored Patient Re-evaluated:Patient Re-evaluated prior to induction Oxygen Delivery Method: Circle system utilized Preoxygenation: Pre-oxygenation with 100% oxygen Induction Type: IV induction Ventilation: Mask ventilation without difficulty LMA: LMA inserted LMA Size: 4.0 Tube type: Oral Number of attempts: 1 Placement Confirmation: ETT inserted through vocal cords under direct vision,  positive ETCO2 and breath sounds checked- equal and bilateral Dental Injury: Teeth and Oropharynx as per pre-operative assessment

## 2017-02-22 ENCOUNTER — Ambulatory Visit: Payer: PPO | Admitting: Neurology

## 2017-02-22 ENCOUNTER — Encounter (HOSPITAL_COMMUNITY): Payer: Self-pay | Admitting: Urology

## 2017-02-22 NOTE — Anesthesia Postprocedure Evaluation (Signed)
Anesthesia Post Note  Patient: Justin [name]  Procedure(s) Performed: Procedure(s) (LRB): DILATATION OF BLADDER NECK WITH INJECTION OF MITOMYCIN C (N/A)     Patient location during evaluation: PACU Anesthesia Type: General Level of consciousness: awake and alert Pain management: pain level controlled Vital Signs Assessment: post-procedure vital signs reviewed and stable Respiratory status: spontaneous breathing, nonlabored ventilation, respiratory function stable and patient connected to nasal cannula oxygen Cardiovascular status: blood pressure returned to baseline and stable Postop Assessment: no signs of nausea or vomiting Anesthetic complications: no    Last Vitals:  Vitals:   02/21/17 1848 02/21/17 1856  BP: (!) 182/78 (!) 179/87  Pulse: 60 (!) 59  Resp: 13 13  Temp:    SpO2: 98% 98%    Last Pain:  Vitals:   02/21/17 1845  TempSrc:   PainSc: 0-No pain                 Jaythan Hinely S

## 2017-02-23 NOTE — Op Note (Signed)
Preoperative diagnosis: bladder neck contracture  Postoperative diagnosis: same  Procedure: 1 cystoscopy 2. Transurethral incision of the bladder neck 3. Mitomycin C injection in the bladder neck  Attending: Nicolette Bang  Anesthesia: General  Estimated blood loss: Minimal  Drains: 22 French foley  Specimens: none  Antibiotics: ancef  Findings: pinpoint bladder neck contracture. Ureteral orifices in normal anatomic location.   Indications: Patient is a 83 year old male with a history of BPH who developed a bladder neck contracture after simple prostatectomy.  After discussing treatment options, they decided proceed with bladder neck incision and mitomycin injection.  Procedure her in detail: The patient was brought to the operating room and a brief timeout was done to ensure correct patient, correct procedure, correct site.  General anesthesia was administered patient was placed in dorsal lithotomy position.  Their genitalia was then prepped and draped in usual sterile fashion.  A rigid 44 French cystoscope was passed in the urethra. We encountered a pinpoint contracture at the bladder neck. Using the monopolar electrocautery we made an incision at 5,7, and 12 oclock location. We were then able to enter the bladder.  Bladder was inspected and we noted no masses or lesions.  the ureteral orifices were in the normal orthotopic locations.  We then re-inspected the prostatic fossa and found no residual bleeding.  We then injected 31ml of 0.5mg /67ml of mitomycin into 3 locations at the bladder neck.  the bladder was then drained, a 22 French foley was placed and this concluded the procedure which was well tolerated by patient.  Complications: None  Condition: Stable, extubated, transferred to PACU  Plan: Patient is to be discharged home and followup in 5 days for foley catheter removal.

## 2017-03-01 DIAGNOSIS — R3914 Feeling of incomplete bladder emptying: Secondary | ICD-10-CM | POA: Diagnosis not present

## 2017-03-21 DIAGNOSIS — N401 Enlarged prostate with lower urinary tract symptoms: Secondary | ICD-10-CM | POA: Diagnosis not present

## 2017-03-21 DIAGNOSIS — R3912 Poor urinary stream: Secondary | ICD-10-CM | POA: Diagnosis not present

## 2017-03-28 ENCOUNTER — Encounter: Payer: Self-pay | Admitting: Neurology

## 2017-03-28 ENCOUNTER — Ambulatory Visit (INDEPENDENT_AMBULATORY_CARE_PROVIDER_SITE_OTHER): Payer: PPO | Admitting: Neurology

## 2017-03-28 VITALS — BP 124/80 | HR 63 | Ht 65.0 in | Wt 137.8 lb

## 2017-03-28 DIAGNOSIS — R2681 Unsteadiness on feet: Secondary | ICD-10-CM

## 2017-03-28 DIAGNOSIS — G3184 Mild cognitive impairment, so stated: Secondary | ICD-10-CM

## 2017-03-28 DIAGNOSIS — G20 Parkinson's disease: Secondary | ICD-10-CM | POA: Diagnosis not present

## 2017-03-28 MED ORDER — CARBIDOPA-LEVODOPA 25-100 MG PO TABS: ORAL_TABLET | ORAL | 0 refills | Status: DC

## 2017-03-28 NOTE — Patient Instructions (Signed)
1. Start carbidopa (Sinemet) 25/100mg  tablets.  Week 1:  Take 0.5 tablet in evening/bedtime. Week 2: Take 0.5 tablet in morning and 0.5 tablet in evening/bedtime.  Week 3: Take 0.5 tablet in morning, 0.5 tablet in afternoon, and 0.5 tablet in evening/bedtime.  Week 4 & thereafter: Take 1 tablet three times daily.   Take the medication at the same time everyday. The medication does not get absorbed into your body as well, if you take it with protein-containing foods (meat, dairy, beans). Try taking the medication about one hour before meals. If you experience nausea by taking it on an empty stomach, you may take it with carbohydrate-containing food,such as bread.  Side effects to look out for, include dizziness, nausea, vivid dreams and hallucinations. If you experience any of these symptoms, please call us.  2.  Follow up in 5 months.

## 2017-03-28 NOTE — Progress Notes (Signed)
NEUROLOGY FOLLOW UP OFFICE NOTE  Justin [name] [id number]  HISTORY OF PRESENT ILLNESS: Justin [name] is an 83 year old man with paroxysmal atrial fibrillation, orthostatic hypotension, hypertension, hyperlipidemia, and CAD who follows up for memory problems and continued balance problems.  He is accompanied by his wife who supplements history.  UPDATE: In evaluating memory, B12 from 05/25/16 was 599.  Repeat testing on 11/25/16 was 741.  TSH was 7.432.  He was started on Aricept, which was discontinued due to tiredness and dizziness.  He has had problems with BPH and underwent surgery.  Each time he has a surgery, he becomes confused and his memory gets worse.     HISTORY: He has had problems with balance for 6 years.  He loses balance easily if his eyes are closed or if he stands in one place for several minutes.  He cannot stand on one foot. He also notes trouble walking down hills as well as walking up stairs. When he walks, he will sometimes stumble.  He also has chronic problems with his left foot though, such as bunions. He denies any neck pain or lower back pain radiating down the legs. He denies any sensation of numbness and tingling. He denies focal weakness but occasionally he reports generalized weakness where his legs give out. He denies any bowel or bladder dysfunction. When he closes his eyes, he has a sensation of sway, but no actual vertigo or fullness in his head. He does have occasional tinnitus and wears hearing aids. He has not had any sensation of passing out.     I saw him in 2014 and he underwent a neuropathy workup.  NCV-EMG was normal.  2 hour glucose tolerance test was normal.  B12 was 542, methylmalonic acid 0.11,  Sed Rate 12, folate over 24.8, RPR nonreactive, SPEP and IFE negative, ANA negative.  Recent Hgb A1c wasa 5.2 and TSH was 4.34.   For a couple of years he had had episodes of confusion where he cannot process or collect his thoughts.  It may last up to an hour  and occur once a week.  He may forget medications or names of people.  After he had knee surgery in April, he was more confused for a bit.   He had had imaging of the head for dizziness.  CT of head from 09/13/14 was personally reviewed and revealed atrophy and chronic small vessel ischemic changes but nothing acute.  Repeat CT of head from 03/23/16 was unchanged.  PAST MEDICAL HISTORY: Past Medical History:  Diagnosis Date  . Basal cell carcinoma   . BPH (benign prostatic hypertrophy)   . CAD (coronary artery disease) 1991   a. H/o MI w/ PTCA;  b. s/p CABG by Dr Roxan Hockey w/ LIMA-LAD, SVG-OM1-OM3, SVG-D1, SVG-PDA  . CHF (congestive heart failure) ([place])   . Complication of anesthesia    Wife reports history of confusion after last surgery in 10/2016-lasted a few days  . Disequilibrium 04/03/2012  . Dysrhythmia   . HTN (hypertension)   . Hyperlipidemia   . Internal hemorrhoids   . Medicare annual wellness visit, subsequent 03/16/2015   Follows with cardiology, Dr Stanford Breed, Follows with dermatology, Dr Altamese Cabal. Aged out of colonoscopy  . Moderate aortic stenosis    a. 08/2014 Echo: EF 55-60%, no rwma, Gr 1 DD, mod AS, mildly dil asc Ao, mild MR, sev dil LA.  [name] PAF (paroxysmal atrial fibrillation) ([place])    a. Dx 08/2014;  b. CHA2DS2VASc = 4 -  decision made to withold [place] 2/2 unsteady gait and syncope.  . Pneumonia    as a baby  . Sigmoid diverticulitis   . Syncope    a. 08/2014 - ? orthostatic    MEDICATIONS: Current Outpatient Prescriptions on File Prior to Visit  Medication Sig Dispense Refill  . acetaminophen (TYLENOL) 500 MG tablet Take 500 mg by mouth daily as needed for moderate pain.    [name] amiodarone (PACERONE) 200 MG tablet Take 200 mg by mouth daily.    [name] apixaban (ELIQUIS) 5 MG TABS tablet Take 1 tablet (5 mg total) by mouth 2 (two) times daily. 180 tablet 3  . beta carotene w/minerals (OCUVITE) tablet Take 1 tablet by mouth daily.    . Carboxymethylcellulose Sodium  (THERATEARS OP) Apply 1 drop to eye daily as needed (dry eyes).    [name] diltiazem (CARTIA XT) 120 MG 24 hr capsule Take 1 capsule (120 mg total) by mouth 2 (two) times daily. 180 capsule 3  . docusate sodium (COLACE) 100 MG capsule Take 100 mg by mouth 2 (two) times daily as needed for mild constipation.    . ferrous sulfate 325 (65 FE) MG tablet Take 325 mg by mouth 2 (two) times daily.     . finasteride (PROSCAR) 5 MG tablet Take 5 mg by mouth every morning.     . lovastatin (MEVACOR) 20 MG tablet Take 1 tablet (20 mg total) by mouth daily with breakfast. 90 tablet 3  . meclizine (ANTIVERT) 25 MG tablet Take 1 tablet (25 mg total) by mouth 3 (three) times daily as needed for dizziness. 30 tablet 0  . Multiple Vitamin (MULTIVITAMIN WITH MINERALS) TABS tablet Take 1 tablet by mouth daily.    . Probiotic Product (PROBIOTIC DAILY PO) Take 1 capsule by mouth daily.    . traMADol (ULTRAM) 50 MG tablet Take 1 tablet (50 mg total) by mouth every 6 (six) hours as needed for severe pain. 30 tablet 0   No current facility-administered medications on file prior to visit.     ALLERGIES: Allergies  Allergen Reactions  . Toradol [Ketorolac Tromethamine] Other (See Comments)    Dizzy and light headed  . Hydrocodone Other (See Comments)    hallucinations  . Dutasteride Diarrhea    GI upset and incontinence    FAMILY HISTORY: Family History  Problem Relation Age of Onset  . Coronary artery disease Father   . Hyperlipidemia Father   . Heart attack Father   . Alcohol abuse Father   . Bipolar disorder Mother   . Heart disease Mother   . Alcohol abuse Brother   . Cancer Brother   . Cancer Maternal Grandfather        oral cancer, chew tobacco  . Cancer Sister   . Glaucoma Sister   . Alcohol abuse Unknown     SOCIAL HISTORY: Social History   Social History  . Marital status: Married    Spouse name: N/A  . Number of children: 2  . Years of education: N/A   Occupational History  .  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 Smoking status: Former Smoker    Packs/day: 0.50    Years: 15.00    Quit date: 06/29/1971  . Smokeless tobacco: Never Used  . Alcohol use 0.6 - 1.2 oz/week    1 - 2 Glasses of wine per week     Comment: alternates wine and liquor. 1/2 pint liquor/week  . Drug use: No  .  Sexual activity: No     Comment: lives with wife, retired English as a second language teacher, no dietary restrictions   Other Topics Concern  . Not on file   Social History Narrative  . No narrative on file    REVIEW OF SYSTEMS: Constitutional: No fevers, chills, or sweats, no generalized fatigue, change in appetite Eyes: No visual changes, double vision, eye pain Ear, nose and throat: No hearing loss, ear pain, nasal congestion, sore throat Cardiovascular: No chest pain, palpitations Respiratory:  No shortness of breath at rest or with exertion, wheezes GastrointestinaI: No nausea, vomiting, diarrhea, abdominal pain, fecal incontinence Genitourinary:  No dysuria, urinary retention or frequency Musculoskeletal:  No neck pain, back pain Integumentary: No rash, pruritus, skin lesions Neurological: as above Psychiatric: No depression, insomnia, anxiety Endocrine: No palpitations, fatigue, diaphoresis, mood swings, change in appetite, change in weight, increased thirst Hematologic/Lymphatic:  No purpura, petechiae. Allergic/Immunologic: no itchy/runny eyes, nasal congestion, recent allergic reactions, rashes  PHYSICAL EXAM: Vitals:   03/28/17 1125  BP: 124/80  Pulse: 63  SpO2: 96%   General: No acute distress.  Patient appears well-groomed.   Head:  Normocephalic/atraumatic Eyes:  Fundi examined but not visualized Neck: supple, no paraspinal tenderness, full range of motion Heart:  Regular rate and rhythm Lungs:  Clear to auscultation bilaterally Back: No paraspinal tenderness Neurological Exam: Mental status: alert and oriented to person,  place, and time, delayed recall poor, remote memory intact, fund of knowledge intact, attention and concentration intact, speech fluent and not dysarthric, language intact. MMSE - Mini Mental State Exam 03/28/2017 05/25/2016  Orientation to time 4 5  Orientation to Place 5 4  Registration 3 3  Attention/ Calculation 5 5  Recall 0 1  Language- name 2 objects 2 2  Language- repeat 1 1  Language- follow 3 step command 3 3  Language- read & follow direction 1 1  Write a sentence 1 1  Copy design 1 1  Total score 26 27  .  CN:  CN II-XII intact.  Bulk & Tone: normal, no fasciculations.  Motor:  Bradykinetic; 5/5 throughout.  Mild cogwheel rigidity in wrists.  Fine tremor.  Sensation: temperature intact and vibration sensation reduced in feet. Deep Tendon Reflexes:  2+ throughout, toes downgoing. Finger to nose testing:  Without dysmetria.  Gait:  Unsteady, shuffling gait.  Able to turn, unable to tandem walk. Romberg positive.  IMPRESSION: 1.  Mild cognitive impairment 2.  Gait instability.  Multifactorial, however he does exhibit parkinsonism.  Underlying Parkinson disease possible.  PLAN: 1.  We will try Sinemet and see how he does.  If he notes improvement, then underlying Parkinson disease is likely.  We will start with 0.5 tablet at bedtime and titrate very slowly (due to concerns for potential side effects) to goal of 1 tablet three times daily.  Side effects discussed. 2.  Follow up in 5 months.  19 minutes spent face to face with patient, over 50% spent discussing possible diagnosis and plan.  Metta Clines, DO  CC:  [name], MD

## 2017-03-29 DIAGNOSIS — Z23 Encounter for immunization: Secondary | ICD-10-CM | POA: Diagnosis not present

## 2017-03-31 DIAGNOSIS — R3914 Feeling of incomplete bladder emptying: Secondary | ICD-10-CM | POA: Diagnosis not present

## 2017-03-31 DIAGNOSIS — R8271 Bacteriuria: Secondary | ICD-10-CM | POA: Diagnosis not present

## 2017-04-01 DIAGNOSIS — R339 Retention of urine, unspecified: Secondary | ICD-10-CM | POA: Diagnosis not present

## 2017-04-11 DIAGNOSIS — Z85828 Personal history of other malignant neoplasm of skin: Secondary | ICD-10-CM | POA: Diagnosis not present

## 2017-04-11 DIAGNOSIS — Z08 Encounter for follow-up examination after completed treatment for malignant neoplasm: Secondary | ICD-10-CM | POA: Diagnosis not present

## 2017-04-11 DIAGNOSIS — L570 Actinic keratosis: Secondary | ICD-10-CM | POA: Diagnosis not present

## 2017-04-11 DIAGNOSIS — D0439 Carcinoma in situ of skin of other parts of face: Secondary | ICD-10-CM | POA: Diagnosis not present

## 2017-04-22 DIAGNOSIS — Z961 Presence of intraocular lens: Secondary | ICD-10-CM | POA: Diagnosis not present

## 2017-04-22 DIAGNOSIS — H02053 Trichiasis without entropian right eye, unspecified eyelid: Secondary | ICD-10-CM | POA: Diagnosis not present

## 2017-04-22 DIAGNOSIS — H35033 Hypertensive retinopathy, bilateral: Secondary | ICD-10-CM | POA: Diagnosis not present

## 2017-04-22 DIAGNOSIS — H353132 Nonexudative age-related macular degeneration, bilateral, intermediate dry stage: Secondary | ICD-10-CM | POA: Diagnosis not present

## 2017-04-22 DIAGNOSIS — H02052 Trichiasis without entropian right lower eyelid: Secondary | ICD-10-CM | POA: Diagnosis not present

## 2017-04-25 DIAGNOSIS — R3912 Poor urinary stream: Secondary | ICD-10-CM | POA: Diagnosis not present

## 2017-04-25 DIAGNOSIS — N401 Enlarged prostate with lower urinary tract symptoms: Secondary | ICD-10-CM | POA: Diagnosis not present

## 2017-05-04 DIAGNOSIS — R339 Retention of urine, unspecified: Secondary | ICD-10-CM | POA: Diagnosis not present

## 2017-05-28 DIAGNOSIS — I214 Non-ST elevation (NSTEMI) myocardial infarction: Secondary | ICD-10-CM

## 2017-05-28 HISTORY — DX: Non-ST elevation (NSTEMI) myocardial infarction: I21.4

## 2017-06-08 ENCOUNTER — Telehealth: Payer: Self-pay | Admitting: Neurology

## 2017-06-08 NOTE — Telephone Encounter (Signed)
Pt needs a prescription for carbidopa called in to the pharmacy

## 2017-06-09 MED ORDER — CARBIDOPA-LEVODOPA 25-100 MG PO TABS: 1.0000 | ORAL_TABLET | Freq: Three times a day (TID) | ORAL | 2 refills | Status: DC

## 2017-06-09 NOTE — Telephone Encounter (Signed)
Sent in Rx, called and spokw ith Pts wife, advsd

## 2017-06-10 ENCOUNTER — Inpatient Hospital Stay (HOSPITAL_COMMUNITY): Payer: PPO

## 2017-06-10 ENCOUNTER — Other Ambulatory Visit: Payer: Self-pay

## 2017-06-10 ENCOUNTER — Inpatient Hospital Stay (HOSPITAL_BASED_OUTPATIENT_CLINIC_OR_DEPARTMENT_OTHER)
Admission: EM | Admit: 2017-06-10 | Discharge: 2017-06-14 | DRG: 247 | Disposition: A | Discharge status: Home / Self Care | Payer: PPO | Attending: Cardiology | Admitting: Cardiology

## 2017-06-10 ENCOUNTER — Encounter (HOSPITAL_BASED_OUTPATIENT_CLINIC_OR_DEPARTMENT_OTHER): Payer: Self-pay | Admitting: Emergency Medicine

## 2017-06-10 ENCOUNTER — Emergency Department (HOSPITAL_BASED_OUTPATIENT_CLINIC_OR_DEPARTMENT_OTHER): Payer: PPO

## 2017-06-10 DIAGNOSIS — I25110 Atherosclerotic heart disease of native coronary artery with unstable angina pectoris: Secondary | ICD-10-CM | POA: Diagnosis not present

## 2017-06-10 DIAGNOSIS — I5022 Chronic systolic (congestive) heart failure: Secondary | ICD-10-CM | POA: Diagnosis not present

## 2017-06-10 DIAGNOSIS — E785 Hyperlipidemia, unspecified: Secondary | ICD-10-CM | POA: Diagnosis not present

## 2017-06-10 DIAGNOSIS — I480 Paroxysmal atrial fibrillation: Secondary | ICD-10-CM | POA: Diagnosis present

## 2017-06-10 DIAGNOSIS — I340 Nonrheumatic mitral (valve) insufficiency: Secondary | ICD-10-CM

## 2017-06-10 DIAGNOSIS — Z85828 Personal history of other malignant neoplasm of skin: Secondary | ICD-10-CM

## 2017-06-10 DIAGNOSIS — I739 Peripheral vascular disease, unspecified: Secondary | ICD-10-CM | POA: Diagnosis present

## 2017-06-10 DIAGNOSIS — Z87891 Personal history of nicotine dependence: Secondary | ICD-10-CM | POA: Diagnosis not present

## 2017-06-10 DIAGNOSIS — T1490XA Injury, unspecified, initial encounter: Secondary | ICD-10-CM

## 2017-06-10 DIAGNOSIS — Z79899 Other long term (current) drug therapy: Secondary | ICD-10-CM

## 2017-06-10 DIAGNOSIS — I110 Hypertensive heart disease with heart failure: Secondary | ICD-10-CM | POA: Diagnosis present

## 2017-06-10 DIAGNOSIS — I2583 Coronary atherosclerosis due to lipid rich plaque: Secondary | ICD-10-CM | POA: Diagnosis present

## 2017-06-10 DIAGNOSIS — F05 Delirium due to known physiological condition: Secondary | ICD-10-CM | POA: Diagnosis not present

## 2017-06-10 DIAGNOSIS — Z7901 Long term (current) use of anticoagulants: Secondary | ICD-10-CM

## 2017-06-10 DIAGNOSIS — Z96642 Presence of left artificial hip joint: Secondary | ICD-10-CM | POA: Diagnosis not present

## 2017-06-10 DIAGNOSIS — I200 Unstable angina: Secondary | ICD-10-CM | POA: Diagnosis present

## 2017-06-10 DIAGNOSIS — Z9861 Coronary angioplasty status: Secondary | ICD-10-CM

## 2017-06-10 DIAGNOSIS — I2510 Atherosclerotic heart disease of native coronary artery without angina pectoris: Secondary | ICD-10-CM | POA: Diagnosis present

## 2017-06-10 DIAGNOSIS — Z955 Presence of coronary angioplasty implant and graft: Secondary | ICD-10-CM

## 2017-06-10 DIAGNOSIS — N400 Enlarged prostate without lower urinary tract symptoms: Secondary | ICD-10-CM | POA: Diagnosis present

## 2017-06-10 DIAGNOSIS — M19031 Primary osteoarthritis, right wrist: Secondary | ICD-10-CM | POA: Diagnosis not present

## 2017-06-10 DIAGNOSIS — Z951 Presence of aortocoronary bypass graft: Secondary | ICD-10-CM | POA: Diagnosis not present

## 2017-06-10 DIAGNOSIS — R079 Chest pain, unspecified: Secondary | ICD-10-CM | POA: Diagnosis present

## 2017-06-10 DIAGNOSIS — I350 Nonrheumatic aortic (valve) stenosis: Secondary | ICD-10-CM | POA: Diagnosis not present

## 2017-06-10 DIAGNOSIS — I214 Non-ST elevation (NSTEMI) myocardial infarction: Principal | ICD-10-CM

## 2017-06-10 DIAGNOSIS — R410 Disorientation, unspecified: Secondary | ICD-10-CM | POA: Diagnosis not present

## 2017-06-10 DIAGNOSIS — I10 Essential (primary) hypertension: Secondary | ICD-10-CM | POA: Diagnosis present

## 2017-06-10 DIAGNOSIS — I25810 Atherosclerosis of coronary artery bypass graft(s) without angina pectoris: Secondary | ICD-10-CM | POA: Diagnosis not present

## 2017-06-10 DIAGNOSIS — M25521 Pain in right elbow: Secondary | ICD-10-CM | POA: Diagnosis not present

## 2017-06-10 LAB — CBC
HCT: 42.3 % (ref 39.0–52.0)
Hemoglobin: 14.3 g/dL (ref 13.0–17.0)
MCH: 29.4 pg (ref 26.0–34.0)
MCHC: 33.8 g/dL (ref 30.0–36.0)
MCV: 86.9 fL (ref 78.0–100.0)
PLATELETS: 393 10*3/uL (ref 150–400)
RBC: 4.87 MIL/uL (ref 4.22–5.81)
RDW: 15.9 % — AB (ref 11.5–15.5)
WBC: 9.9 10*3/uL (ref 4.0–10.5)

## 2017-06-10 LAB — APTT
aPTT: 34 seconds (ref 24–36)
aPTT: 79 seconds — ABNORMAL HIGH (ref 24–36)

## 2017-06-10 LAB — BASIC METABOLIC PANEL
Anion gap: 7 (ref 5–15)
BUN: 18 mg/dL (ref 6–20)
CHLORIDE: 98 mmol/L — AB (ref 101–111)
CO2: 28 mmol/L (ref 22–32)
CREATININE: 1.14 mg/dL (ref 0.61–1.24)
Calcium: 8.9 mg/dL (ref 8.9–10.3)
GFR calc Af Amer: >60 mL/min (ref >60)
GFR calc non Af Amer: 56 mL/min — ABNORMAL LOW (ref >60)
GLUCOSE: 102 mg/dL — AB (ref 65–99)
Potassium: 3.9 mmol/L (ref 3.5–5.1)
SODIUM: 133 mmol/L — AB (ref 135–145)

## 2017-06-10 LAB — HEPARIN LEVEL (UNFRACTIONATED): Heparin Unfractionated: 1.86 IU/mL — ABNORMAL HIGH (ref 0.30–0.70)

## 2017-06-10 LAB — BRAIN NATRIURETIC PEPTIDE: B Natriuretic Peptide: 423.1 pg/mL — ABNORMAL HIGH (ref 0.0–100.0)

## 2017-06-10 LAB — TROPONIN I
TROPONIN I: 5.67 ng/mL — AB (ref <0.03)
Troponin I: 1.62 ng/mL (ref <0.03)
Troponin I: 3.41 ng/mL (ref <0.03)

## 2017-06-10 MED ORDER — ACETAMINOPHEN 325 MG PO TABS: 650.0000 mg | ORAL_TABLET | ORAL | Status: DC | PRN

## 2017-06-10 MED ORDER — MORPHINE SULFATE (PF) 2 MG/ML IV SOLN: 2.0000 mg | INTRAVENOUS | Status: DC | PRN

## 2017-06-10 MED ORDER — METOPROLOL TARTRATE 12.5 MG HALF TABLET
12.5000 mg | ORAL_TABLET | Freq: Two times a day (BID) | ORAL | Status: DC
  Administered 2017-06-10 – 2017-06-14 (×7): 12.5 mg via ORAL
  Filled 2017-06-10 (×8): qty 1

## 2017-06-10 MED ORDER — HEPARIN (PORCINE) IN NACL 100-0.45 UNIT/ML-% IJ SOLN
1000.0000 [IU]/h | INTRAMUSCULAR | Status: DC
  Administered 2017-06-10: 750 [IU]/h via INTRAVENOUS
  Administered 2017-06-12: 1000 [IU]/h via INTRAVENOUS
  Filled 2017-06-10: qty 250

## 2017-06-10 MED ORDER — MORPHINE SULFATE (PF) 4 MG/ML IV SOLN
4.0000 mg | Freq: Once | INTRAVENOUS | Status: AC
  Administered 2017-06-10: 4 mg via INTRAVENOUS
  Filled 2017-06-10: qty 1

## 2017-06-10 MED ORDER — CARBIDOPA-LEVODOPA 25-100 MG PO TABS
1.0000 | ORAL_TABLET | Freq: Three times a day (TID) | ORAL | Status: DC
  Administered 2017-06-10 – 2017-06-14 (×11): 1 via ORAL
  Filled 2017-06-10 (×11): qty 1

## 2017-06-10 MED ORDER — AMIODARONE HCL 200 MG PO TABS
200.0000 mg | ORAL_TABLET | Freq: Every day | ORAL | Status: DC
  Administered 2017-06-10 – 2017-06-14 (×5): 200 mg via ORAL
  Filled 2017-06-10 (×5): qty 1

## 2017-06-10 MED ORDER — PRAVASTATIN SODIUM 40 MG PO TABS
20.0000 mg | ORAL_TABLET | Freq: Every day | ORAL | Status: DC
  Administered 2017-06-10 – 2017-06-13 (×4): 20 mg via ORAL
  Filled 2017-06-10 (×4): qty 1

## 2017-06-10 MED ORDER — SODIUM CHLORIDE 0.9% FLUSH: 3.0000 mL | INTRAVENOUS | Status: DC | PRN

## 2017-06-10 MED ORDER — NITROGLYCERIN 2 % TD OINT
1.0000 [in_us] | TOPICAL_OINTMENT | Freq: Four times a day (QID) | TRANSDERMAL | Status: DC
  Administered 2017-06-10: 1 [in_us] via TOPICAL
  Filled 2017-06-10: qty 1

## 2017-06-10 MED ORDER — SODIUM CHLORIDE 0.9 % IV SOLN: 250.0000 mL | INTRAVENOUS | Status: DC | PRN

## 2017-06-10 MED ORDER — NITROGLYCERIN 2 % TD OINT
0.5000 [in_us] | TOPICAL_OINTMENT | Freq: Four times a day (QID) | TRANSDERMAL | Status: DC
  Administered 2017-06-10 – 2017-06-14 (×14): 0.5 [in_us] via TOPICAL
  Filled 2017-06-10 (×2): qty 30

## 2017-06-10 MED ORDER — ASPIRIN EC 81 MG PO TBEC
81.0000 mg | DELAYED_RELEASE_TABLET | Freq: Every day | ORAL | Status: DC
  Administered 2017-06-11 – 2017-06-12 (×2): 81 mg via ORAL
  Filled 2017-06-10 (×2): qty 1

## 2017-06-10 MED ORDER — ONDANSETRON HCL 4 MG/2ML IJ SOLN: 4.0000 mg | Freq: Four times a day (QID) | INTRAMUSCULAR | Status: DC | PRN

## 2017-06-10 MED ORDER — TRAMADOL HCL 50 MG PO TABS
50.0000 mg | ORAL_TABLET | Freq: Four times a day (QID) | ORAL | Status: DC | PRN
  Administered 2017-06-11: 50 mg via ORAL
  Filled 2017-06-10: qty 1

## 2017-06-10 MED ORDER — CARBIDOPA-LEVODOPA 25-100 MG PO TABS
1.0000 | ORAL_TABLET | Freq: Three times a day (TID) | ORAL | 3 refills | Status: DC
Start: 2017-06-10 — End: 2017-08-26

## 2017-06-10 MED ORDER — HEPARIN (PORCINE) IN NACL 100-0.45 UNIT/ML-% IJ SOLN
INTRAMUSCULAR | Status: AC
  Filled 2017-06-10: qty 250

## 2017-06-10 MED ORDER — ASPIRIN 81 MG PO CHEW
324.0000 mg | CHEWABLE_TABLET | Freq: Once | ORAL | Status: DC
Start: 2017-06-10 — End: 2017-06-10

## 2017-06-10 MED ORDER — FINASTERIDE 5 MG PO TABS
5.0000 mg | ORAL_TABLET | Freq: Every morning | ORAL | Status: DC
  Administered 2017-06-10 – 2017-06-14 (×5): 5 mg via ORAL
  Filled 2017-06-10 (×5): qty 1

## 2017-06-10 MED ORDER — SODIUM CHLORIDE 0.9% FLUSH
3.0000 mL | Freq: Two times a day (BID) | INTRAVENOUS | Status: DC
  Administered 2017-06-10 – 2017-06-13 (×6): 3 mL via INTRAVENOUS

## 2017-06-10 MED ORDER — MORPHINE SULFATE (PF) 2 MG/ML IV SOLN
2.0000 mg | Freq: Once | INTRAVENOUS | Status: AC
  Administered 2017-06-10: 2 mg via INTRAVENOUS
  Filled 2017-06-10: qty 1

## 2017-06-10 MED ORDER — NITROGLYCERIN 0.4 MG SL SUBL: 0.4000 mg | SUBLINGUAL_TABLET | SUBLINGUAL | Status: DC | PRN

## 2017-06-10 MED ORDER — DILTIAZEM HCL ER COATED BEADS 120 MG PO CP24
120.0000 mg | ORAL_CAPSULE | Freq: Two times a day (BID) | ORAL | Status: DC
  Administered 2017-06-10 – 2017-06-14 (×7): 120 mg via ORAL
  Filled 2017-06-10 (×8): qty 1

## 2017-06-10 MED ORDER — HEPARIN BOLUS VIA INFUSION
3500.0000 [IU] | Freq: Once | INTRAVENOUS | Status: AC
  Administered 2017-06-10: 3500 [IU] via INTRAVENOUS

## 2017-06-10 NOTE — Progress Notes (Signed)
ANTICOAGULATION CONSULT NOTE - Follow Up Consult  Pharmacy Consult for Heparin Indication: chest pain/ACS  Allergies  Allergen Reactions  . Toradol [Ketorolac Tromethamine] Other (See Comments)    Dizzy and light headed  . Hydrocodone Other (See Comments)    hallucinations  . Dutasteride Diarrhea    GI upset and incontinence    Patient Measurements: Height: 5\' 5"  (165.1 cm) Weight: 141 lb (64 kg) IBW/kg (Calculated) : 61.5  Vital Signs: Temp: 97.5 F (36.4 C) (12/14 1415) Temp Source: Oral (12/14 1415) BP: 129/73 (12/14 2010) Pulse Rate: 60 (12/14 2010)  Labs: Recent Labs    06/10/17 0830 06/10/17 1203 06/10/17 1539 06/10/17 2019  HGB 14.3  --   --   --   HCT 42.3  --   --   --   PLT 393  --   --   --   APTT 34  --   --  79*  CREATININE 1.14  --   --   --   TROPONINI 1.62* 3.41* 5.67*  --     Estimated Creatinine Clearance: 40.5 mL/min (by C-G formula based on SCr of 1.14 mg/dL).   Medications:  Heparin @ 750 units/hr  Assessment: 88yom continues on heparin for NSTEMI while eliquis on hold. Using aPTTs to guide heparin dosing as eliquis falsely elevates heparin levels. Initial aPTT is therapeutic at 79 seconds. No bleeding reported.  Goal of Therapy: APTT 66-102 seconds  Heparin level 0.3-0.7 units/ml Monitor platelets by anticoagulation protocol: Yes   Plan:  1) Continue heparin at 750 units/hr 2) Follow up daily heparin level and aPTT  Deboraha Sprang 06/10/2017,10:02 PM

## 2017-06-10 NOTE — Progress Notes (Signed)
ANTICOAGULATION CONSULT NOTE - Initial Consult  Pharmacy Consult for Heparin Indication: chest pain/ACS  Allergies  Allergen Reactions  . Toradol [Ketorolac Tromethamine] Other (See Comments)    Dizzy and light headed  . Hydrocodone Other (See Comments)    hallucinations  . Dutasteride Diarrhea    GI upset and incontinence   Patient Measurements: Height: 5\' 5"  (165.1 cm) Weight: 135 lb (61.2 kg) IBW/kg (Calculated) : 61.5 Heparin Dosing Weight: 61.2 kg  Vital Signs: Temp: 97.7 F (36.5 C) (12/14 0830) Temp Source: Oral (12/14 0830) BP: 180/88 (12/14 0830) Pulse Rate: 66 (12/14 0830)  Labs: Recent Labs    06/10/17 0830  HGB 14.3  HCT 42.3  PLT 393  CREATININE 1.14  TROPONINI 1.62*    Estimated Creatinine Clearance: 40.3 mL/min (by C-G formula based on SCr of 1.14 mg/dL).   Medical History: Past Medical History:  Diagnosis Date  . Basal cell carcinoma   . BPH (benign prostatic hypertrophy)   . CAD (coronary artery disease) 1991   a. H/o MI w/ PTCA;  b. s/p CABG by Dr Roxan Hockey w/ LIMA-LAD, SVG-OM1-OM3, SVG-D1, SVG-PDA  . CHF (congestive heart failure) ([place])   . Complication of anesthesia    Wife reports history of confusion after last surgery in 10/2016-lasted a few days  . Disequilibrium 04/03/2012  . Dysrhythmia   . HTN (hypertension)   . Hyperlipidemia   . Internal hemorrhoids   . Medicare annual wellness visit, subsequent 03/16/2015   Follows with cardiology, Dr Stanford Breed, Follows with dermatology, Dr Altamese Cabal. Aged out of colonoscopy  . Moderate aortic stenosis    a. 08/2014 Echo: EF 55-60%, no rwma, Gr 1 DD, mod AS, mildly dil asc Ao, mild MR, sev dil LA.  [name] PAF (paroxysmal atrial fibrillation) ([place])    a. Dx 08/2014;  b. CHA2DS2VASc = 4 - decision made to withold [place] 2/2 unsteady gait and syncope.  . Pneumonia    as a baby  . Sigmoid diverticulitis   . Syncope    a. 08/2014 - ? orthostatic   Assessment: 44 yoM with history of afib on Eliquis PTA  presents with chest pain, associated left arm pain, and weakness. Pharmacy consulted to start heparin for suspected STEMI. Contacted RN, last dose of Eliquis taken ~1800 on 12/13. H&H and pltc WNL, no bleeding noted. Will still give heparin bolus despite PTA anticoagulation 2/2 possible STEMI. Note increased risk of bleeding.  Goal of Therapy:  Heparin level 0.3-0.7 units/ml aPTT 66-102 seconds Monitor platelets by anticoagulation protocol: Yes   Plan:  Will check baseline aPTT and anti-Xa level Give 3500 units bolus x 1 Start heparin infusion at 750 units/hr Check anti-Xa level and aPTT in 8 hours and daily while on heparin Use aPTT to dose heparin until effect of Eliquis is diminished Continue to monitor H&H and platelets  Shields Pautz N. Gerarda Fraction, PharmD PGY1 Pharmacy Resident Pager: [phone] 06/10/2017,9:22 AM

## 2017-06-10 NOTE — ED Provider Notes (Addendum)
[facility] EMERGENCY DEPARTMENT Provider Note   CSN: [id number] Arrival date & time: 06/10/17  0814     History   Chief Complaint Chief Complaint  Patient presents with  . Chest Pain    HPI Justin [name] is a 83 y.o. male.  HPI Patient presents to the emergency room for evaluation of chest pain.  Patient has a history of valvular heart disease as well as coronary artery disease.  He first had an episode of chest pain about a week or so ago.  His wife gave him some aspirin and an antacid.  The symptoms resolved shortly thereafter.  The symptoms did not recur and he was feeling fine his doctor.  This morning he started having another episode of chest pain.  It lasted maybe up to half an hour.  Pain also went into his left arm.  He denies feeling nauseated or short of breath.  He has noticed over the last couple of days he is feeling very weak in his legs when he tries to walk.  He is not noticing any this weakness when he is sitting down or lying down.  Right now his symptoms have resolved and he is not having any pain or discomfort. Past Medical History:  Diagnosis Date  . Basal cell carcinoma   . BPH (benign prostatic hypertrophy)   . CAD (coronary artery disease) 1991   a. H/o MI w/ PTCA;  b. s/p CABG by Dr Roxan Hockey w/ LIMA-LAD, SVG-OM1-OM3, SVG-D1, SVG-PDA  . CHF (congestive heart failure) ([place])   . Complication of anesthesia    Wife reports history of confusion after last surgery in 10/2016-lasted a few days  . Disequilibrium 04/03/2012  . Dysrhythmia   . HTN (hypertension)   . Hyperlipidemia   . Internal hemorrhoids   . Medicare annual wellness visit, subsequent 03/16/2015   Follows with cardiology, Dr Stanford Breed, Follows with dermatology, Dr Altamese Cabal. Aged out of colonoscopy  . Moderate aortic stenosis    a. 08/2014 Echo: EF 55-60%, no rwma, Gr 1 DD, mod AS, mildly dil asc Ao, mild MR, sev dil LA.  [name] PAF (paroxysmal atrial fibrillation) ([place])    a. Dx 08/2014;   b. CHA2DS2VASc = 4 - decision made to withold [place] 2/2 unsteady gait and syncope.  . Pneumonia    as a baby  . Sigmoid diverticulitis   . Syncope    a. 08/2014 - ? orthostatic    Patient Active Problem List   Diagnosis Date Noted  . UTI (urinary tract infection) 11/24/2016  . Sepsis ([place]) 11/24/2016  . Generalized weakness 11/24/2016  . Hyponatremia 11/24/2016  . BPH with urinary obstruction 11/04/2016  . H/O prostatectomy 11/04/2016  . Fall   . Closed left hip fracture ([place]) 10/13/2015  . Medicare annual wellness visit, subsequent 03/16/2015  . Paroxysmal atrial fibrillation ([place]) 09/26/2014  . Orthostatic hypotension 09/15/2014  . Head trauma 09/14/2014  . Alteration in anticoagulation 09/14/2014  . Tinnitus 03/21/2014  . Epigastric pain 02/01/2013  . Gait instability 01/01/2013  . Disequilibrium 04/03/2012  . Hyperglycemia 09/13/2011  . Aortic stenosis 03/10/2011  . CAD, ARTERY BYPASS GRAFT 02/23/2010  . Hyperlipidemia 07/04/2008  . Essential hypertension 07/04/2008  . Coronary atherosclerosis 07/04/2008  . RHINITIS 07/04/2008  . BPH (benign prostatic hyperplasia) 07/04/2008    Past Surgical History:  Procedure Laterality Date  . COLONOSCOPY    . CORONARY ARTERY BYPASS GRAFT  2000   Dr Roxan Hockey  LIMA-LAD, SVG-OM1-OM3, SVG-D1, SVG-PDA.   . CYSTOSCOPY  N/A 12/07/2016   Procedure: CYSTOSCOPY;  Surgeon: Cleon Gustin, MD;  Location: [facility];  Service: Urology;  Laterality: N/A;  NEEDS 30 MIN TOTAL FOR SURGERY  . CYSTOSCOPY WITH URETHRAL DILATATION N/A 02/21/2017   Procedure: DILATATION OF BLADDER NECK WITH INJECTION OF MITOMYCIN C;  Surgeon: Cleon Gustin, MD;  Location: [facility];  Service: Urology;  Laterality: N/A;  . EYE SURGERY     bil cataract  . HIP ARTHROPLASTY Left 10/14/2015   Procedure: LEFT HIP HEMI ARTHROPLASTY;  Surgeon: [name], MD;  Location: [facility];  Service: Orthopedics;  Laterality: Left;  . SHOULDER SURGERY     x2  . TONSILLECTOMY      . TRANSTHORACIC ECHOCARDIOGRAM  10-06-2016   dr Stanford Breed   mild LVH, ef 40-45%, diffuse hypokinesis/ moderate AV calcification leaflets and annulus,  severe thickened leaflets, mod.-sev. aortic stenosis (Valva area 0.61cm^2, mean grandient 15mmHg, peak gradiant 64mmHg)/  moderate thickened , mild calcified MV leaflets without stenosis, moderate MR/  severe LAE/ trivial PR and TR  . TRANSURETHRAL RESECTION OF BLADDER NECK N/A 12/07/2016   Procedure: TRANSURETHRAL RESECTION OF BLADDER NECK;  Surgeon: Cleon Gustin, MD;  Location: [facility];  Service: Urology;  Laterality: N/A;  . XI ROBOTIC ASSISTED SIMPLE PROSTATECTOMY N/A 11/04/2016   Procedure: XI ROBOTIC ASSISTED SIMPLE PROSTATECTOMY;  Surgeon: Cleon Gustin, MD;  Location: [facility];  Service: Urology;  Laterality: N/A;       Home Medications    Prior to Admission medications   Medication Sig Start Date End Date Taking? Authorizing Provider  acetaminophen (TYLENOL) 500 MG tablet Take 500 mg by mouth daily as needed for moderate pain.    [provider]  amiodarone (PACERONE) 200 MG tablet Take 200 mg by mouth daily.    [provider]  apixaban (ELIQUIS) 5 MG TABS tablet Take 1 tablet (5 mg total) by mouth 2 (two) times daily. 02/04/17   Lelon Perla, MD  beta carotene w/minerals (OCUVITE) tablet Take 1 tablet by mouth daily.    [provider]  carbidopa-levodopa (SINEMET IR) 25-100 MG tablet Take 1 tablet by mouth 3 (three) times daily. 06/09/17   Pieter Partridge, DO  carbidopa-levodopa (SINEMET) 25-100 MG tablet Take 0.5 tab QHS x 1week, then 0.5 tab BID x1week, then 0.5 tab TID x 1week, then 1 tab TID 03/28/17   Jaffe, Adam R, DO  Carboxymethylcellulose Sodium (THERATEARS OP) Apply 1 drop to eye daily as needed (dry eyes).    [provider]  diltiazem (CARTIA XT) 120 MG 24 hr capsule Take 1 capsule (120 mg total) by mouth 2 (two) times daily. 01/20/17   Lelon Perla, MD  docusate sodium  (COLACE) 100 MG capsule Take 100 mg by mouth 2 (two) times daily as needed for mild constipation.    [provider]  ferrous sulfate 325 (65 FE) MG tablet Take 325 mg by mouth 2 (two) times daily.     [provider]  finasteride (PROSCAR) 5 MG tablet Take 5 mg by mouth every morning.     [provider]  lovastatin (MEVACOR) 20 MG tablet Take 1 tablet (20 mg total) by mouth daily with breakfast. 02/04/17   Lelon Perla, MD  meclizine (ANTIVERT) 25 MG tablet Take 1 tablet (25 mg total) by mouth 3 (three) times daily as needed for dizziness. 08/05/16   Fredia Sorrow, MD  Multiple Vitamin (MULTIVITAMIN WITH MINERALS) TABS tablet Take 1 tablet by mouth daily.  [provider]  Probiotic Product (PROBIOTIC DAILY PO) Take 1 capsule by mouth daily.    [provider]  traMADol (ULTRAM) 50 MG tablet Take 1 tablet (50 mg total) by mouth every 6 (six) hours as needed for severe pain. 02/21/17   McKenzie, Candee Furbish, MD    Family History Family History  Problem Relation Age of Onset  . Coronary artery disease Father   . Hyperlipidemia Father   . Heart attack Father   . Alcohol abuse Father   . Bipolar disorder Mother   . Heart disease Mother   . Alcohol abuse Brother   . Cancer Brother   . Cancer Maternal Grandfather        oral cancer, chew tobacco  . Cancer Sister   . Glaucoma Sister   . Alcohol abuse Unknown     Social History Social History   Tobacco Use  . Smoking status: Former Smoker    Packs/day: 0.50    Years: 15.00    Pack years: 7.50    Last attempt to quit: 06/29/1971    Years since quitting: 45.9  . Smokeless tobacco: Never Used  Substance Use Topics  . Alcohol use: Yes    Alcohol/week: 0.6 - 1.2 oz    Types: 1 - 2 Glasses of wine per week    Comment: alternates wine and liquor. 1/2 pint liquor/week  . Drug use: No     Allergies   Toradol [ketorolac tromethamine]; Hydrocodone; and Dutasteride   Review of  Systems Review of Systems  All other systems reviewed and are negative.    Physical Exam Updated Vital Signs BP (!) 175/88 (BP Location: Right Arm)   Pulse 60   Temp 97.7 F (36.5 C) (Oral)   Resp 15   Ht 1.651 m (5\' 5" )   Wt 61.2 kg (135 lb)   SpO2 97%   BMI 22.47 kg/m   Physical Exam  Constitutional: No distress.  Elderly  HENT:  Head: Normocephalic and atraumatic.  Right Ear: External ear normal.  Left Ear: External ear normal.  Eyes: Conjunctivae are normal. Right eye exhibits no discharge. Left eye exhibits no discharge. No scleral icterus.  Neck: Neck supple. No tracheal deviation present.  Cardiovascular: Normal rate, regular rhythm and intact distal pulses.  Murmur heard.  Systolic murmur is present. Pulmonary/Chest: Effort normal and breath sounds normal. No stridor. No respiratory distress. He has no wheezes. He has no rales.  Abdominal: Soft. Bowel sounds are normal. He exhibits no distension. There is no tenderness. There is no rebound and no guarding.  Musculoskeletal: He exhibits no edema or tenderness.  Neurological: He is alert. He has normal strength. No cranial nerve deficit (no facial droop, extraocular movements intact, no slurred speech) or sensory deficit. He exhibits normal muscle tone. He displays no seizure activity. Coordination normal.  Skin: Skin is warm and dry. No rash noted.  Psychiatric: He has a normal mood and affect.  Nursing note and vitals reviewed.    ED Treatments / Results  Labs (all labs ordered are listed, but only abnormal results are displayed) Labs Reviewed  BASIC METABOLIC PANEL - Abnormal; Notable for the following components:      Result Value   Sodium 133 (*)    Chloride 98 (*)    Glucose, Bld 102 (*)    GFR calc non Af Amer 56 (*)    All other components within normal limits  CBC - Abnormal; Notable for the following components:   RDW  15.9 (*)    All other components within normal limits  TROPONIN I - Abnormal;  Notable for the following components:   Troponin I 1.62 (*)    All other components within normal limits  TROPONIN I    EKG  EKG Interpretation  Date/Time:  Friday June 10 2017 08:31:13 EST Ventricular Rate:  67 PR Interval:    QRS Duration: 139 QT Interval:  455 QTC Calculation: 481 R Axis:   -76 Text Interpretation:  Sinus rhythm Probable left atrial enlargement Nonspecific IVCD with LAD LVH with secondary repolarization abnormality Probable anterior infarct, age indeterminate No significant change since last tracing Confirmed by Dorie Rank [phone]) on 06/10/2017 8:35:53 AM Also confirmed by Dorie Rank [phone]), editor Philomena Doheny [phone])  on 06/10/2017 9:11:54 AM        EKG Interpretation  Date/Time:  Friday June 10 2017 09:55:55 EST Ventricular Rate:  65 PR Interval:    QRS Duration: 132 QT Interval:  478 QTC Calculation: 498 R Axis:   -72 Text Interpretation:  Sinus rhythm Probable left atrial enlargement Nonspecific IVCD with LAD LVH with secondary repolarization abnormality Anterior infarct, old No significant change since last tracing Confirmed by Dorie Rank [phone]) on 06/10/2017 10:06:12 AM        Radiology Dg Chest 2 View  Result Date: 06/10/2017 CLINICAL DATA:  Chest pain.  Prior CABG. EXAM: CHEST  2 VIEW COMPARISON:  11/24/2016 . FINDINGS: Mediastinum hilar structures are stable. Stable calcified mediastinal hilar lymph nodes are present. Prior CABG. No cardiomegaly. No pulmonary venous congestion. Mild bibasilar subsegmental atelectasis and/or scarring again noted. No focal alveolar infiltrate. No pleural effusion or pneumothorax. IMPRESSION: 1. Mild bibasilar subsegmental atelectasis and/or scarring again noted. No acute cardiopulmonary disease. Stable calcified mediastinal hilar lymph nodes again noted. 2.  Prior CABG.  Heart size normal. Electronically Signed   By: Marcello Moores  Register   On: 06/10/2017 09:05    Procedures .Critical Care Performed by:  Dorie Rank, MD Authorized by: Dorie Rank, MD   Critical care provider statement:    Critical care time (minutes):  30   Critical care was time spent personally by me on the following activities:  Discussions with consultants, evaluation of patient's response to treatment, examination of patient, ordering and performing treatments and interventions, ordering and review of laboratory studies, ordering and review of radiographic studies, pulse oximetry, re-evaluation of patient's condition, obtaining history from patient or surrogate and review of old charts    Medications Ordered in ED Medications  aspirin chewable tablet 324 mg (not administered)  nitroGLYCERIN (NITROGLYN) 2 % ointment 1 inch (not administered)     Initial Impression / Assessment and Plan / ED Course  I have reviewed the triage vital signs and the nursing notes.  Pertinent labs & imaging results that were available during my care of the patient were reviewed by me and considered in my medical decision making (see chart for details).  Clinical Course as of Jun 10 930  Fri Jun 10, 2017  0919 Wife states pt took 2 adult asa this am prior to arrival.  Will hold off on dose in the ED  [JK]  0930 Discussed the findings with the patient.  He confirms that he is pain-free right now  [JK]  0930 Agrees with transfer to [facility]  [JK]    Clinical Course User Index [JK] Dorie Rank, MD    Patient presented to the emergency room for evaluation of an episode of chest pain.  Recent symptoms  had resolved by the time I spoke to him.  He does have history of coronary artery disease and valvular heart disease.  Patient's initial blood tests show an elevated troponin of 1.62.  I am concerned that he has had a ST elevation MI.  I have ordered heparin and nitroglycerin paste.  Patient understands to tell us if the has any recurrent episodes of pain.  I will consult with cardiology for admission to [facility] Final  Clinical Impressions(s) / ED Diagnoses   Final diagnoses:  NSTEMI (non-ST elevated myocardial infarction) [facility])    ED Discharge Orders    None       Dorie Rank, MD 06/10/17 0932   Dr Percival Spanish and I discussed the case.  Plan on transfer and admission to [facility].  OK to hold off on heparin now, pt is on eliquis.   Dorie Rank, MD 06/10/17 [phone]  Patient started having chest pain again.  EKG is unchanged.  He was given a dose of morphine.  Plan on admission to stepdown unit.  We will continue to monitor closely.    Dorie Rank, MD 06/10/17 1028

## 2017-06-10 NOTE — Progress Notes (Signed)
  Echocardiogram 2D Echocardiogram has been performed.  Justin [name] 06/10/2017, 6:04 PM

## 2017-06-10 NOTE — ED Notes (Signed)
Patient took 2 adult aspirin prior to arrival

## 2017-06-10 NOTE — Progress Notes (Signed)
83 yo male history of CAD and PAF admitted with NSTEMI. Took eliquis last night, on hold for plans for cath Monday. Trop up to 5.67 and has not peaked, EKG nonspecific ST/T changes.   Current medical therapy with ASA 81mg , dilt 120bid, hep gtt, lopressor 12.5mg  bid, prava 20, NG paste  Complains of 3/10 chest pain. We will write for 2mg  of IV moprhine, has NG patch currently. Follow symptoms. If progress can redose prn morphine, may need to consider NG drip. Per signout from Dr Percival Spanish if persistent pain over the weekend would plan for cath over the weekend, if controlled medically then still plan for cath Monday   Justin Abts MD

## 2017-06-10 NOTE — ED Triage Notes (Signed)
Patient reports chest pain which began this morning.  States that he also has pain in his left arm and feels "weaker than normal".  Patient states he took 2 baby aspirin prior to arrival to ED.  Denies shortness of breath, N/V.  Reports hx of afib.  States taking medications as prescribed but he has not taken them yet today. States previous episode of chest pain 2 weeks ago but was not evaluated for this as he thought it was due to reflux.

## 2017-06-10 NOTE — Progress Notes (Signed)
Pt Trop increased to 5.67 he has no chest pain or SOB at this time. Cardiology team notified.  Ferdinand Lango, RN

## 2017-06-10 NOTE — ED Notes (Signed)
Patient placed on 2L oxygen via [place].

## 2017-06-10 NOTE — H&P (Signed)
Cardiology Admission History and Physical:   Patient ID: Justin [name]; MRN: [id number]; DOB: [birth year]   Admission date: 06/10/2017  Primary Care Provider: Mosie Lukes, MD Primary Cardiologist: Dr. Stanford Breed Primary Electrophysiologist:    Chief Complaint:  Chest pain  Patient Profile:   Justin [name] is a 83 y.o. male with a history of PAF (since 2016 initially not on AC due to gait disturbance and hx of syncope, now on eliquis), moderate AS, moderate MR, CAD s/p CABG 2001, HTN, HLD, and PVD. He was transferred from [facility] for unstable angina.  History of Present Illness:   Justin [name] is known to this service and last saw Dr. Stanford Breed in clinic on 01/12/17. At that time, he was in his usual state of health and was found to be in NSR. Amiodarone and cardizem were both continued. He was also continued on eliquis.   Per ED provider note, he presented to [place] with onset of chest pain. Chest pain started about a week ago. His symptoms resolved with an ASA and antacid. Today, he had a recurrence of the pain that lasted approximately 30 min and radiated to his left arm.   On my interview, wife is at bedside and helps provide history. He had chest pain at rest on Sunday 06/05/17 that lasted about 30 min and resolved after ASA 325 mg and tums. He did not have a recurrence until 0400 this morning. Chest pain woke him from sleep. It lasted about 30 min and eventually resolved after ASA 325 mg. He tried to sleep again, but again woke up with chest pain. His wife gave another ASA 325 mg and brought him to [place]. He states the chest pain radiated down his arm. His trponins are positive at 1.62 --> 3.41. He received one dose of morphine and nitro paste with resolution of his pain. He was accepted in transfer to [facility] under Cardiology. He is in NSR but states a dull chest pain is returning.   He last took home medications, including eliquis, last evening.    Of note, he walks 0.5 miles 2 times weekly outside with his walker. He reports that over the past week, he has felt more tired and weakness in his legs that prevented him from walking his normal distance.  Past Medical History:  Diagnosis Date  . Basal cell carcinoma   . BPH (benign prostatic hypertrophy)   . CAD (coronary artery disease) 1991   a. H/o MI w/ PTCA;  b. s/p CABG by Dr Roxan Hockey w/ LIMA-LAD, SVG-OM1-OM3, SVG-D1, SVG-PDA  . CHF (congestive heart failure) ([place])   . Complication of anesthesia    Wife reports history of confusion after last surgery in 10/2016-lasted a few days  . Disequilibrium 04/03/2012  . Dysrhythmia   . HTN (hypertension)   . Hyperlipidemia   . Internal hemorrhoids   . Medicare annual wellness visit, subsequent 03/16/2015   Follows with cardiology, Dr Stanford Breed, Follows with dermatology, Dr Altamese Cabal. Aged out of colonoscopy  . Moderate aortic stenosis    a. 08/2014 Echo: EF 55-60%, no rwma, Gr 1 DD, mod AS, mildly dil asc Ao, mild MR, sev dil LA.  [name] PAF (paroxysmal atrial fibrillation) ([place])    a. Dx 08/2014;  b. CHA2DS2VASc = 4 - decision made to withold Kirk 2/2 unsteady gait and syncope.  . Pneumonia    as a baby  . Sigmoid diverticulitis   . Syncope    a.  08/2014 - ? orthostatic    Past Surgical History:  Procedure Laterality Date  . COLONOSCOPY    . CORONARY ARTERY BYPASS GRAFT  2000   Dr Roxan Hockey  LIMA-LAD, SVG-OM1-OM3, SVG-D1, SVG-PDA.   Consuela Mimes N/A 12/07/2016   Procedure: CYSTOSCOPY;  Surgeon: Cleon Gustin, MD;  Location: [facility];  Service: Urology;  Laterality: N/A;  NEEDS 30 MIN TOTAL FOR SURGERY  . CYSTOSCOPY WITH URETHRAL DILATATION N/A 02/21/2017   Procedure: DILATATION OF BLADDER NECK WITH INJECTION OF MITOMYCIN C;  Surgeon: Cleon Gustin, MD;  Location: [facility];  Service: Urology;  Laterality: N/A;  . EYE SURGERY     bil cataract  . HIP ARTHROPLASTY Left 10/14/2015   Procedure: LEFT HIP HEMI ARTHROPLASTY;  Surgeon:  [name], MD;  Location: [facility];  Service: Orthopedics;  Laterality: Left;  . SHOULDER SURGERY     x2  . TONSILLECTOMY    . TRANSTHORACIC ECHOCARDIOGRAM  10-06-2016   dr Stanford Breed   mild LVH, ef 40-45%, diffuse hypokinesis/ moderate AV calcification leaflets and annulus,  severe thickened leaflets, mod.-sev. aortic stenosis (Valva area 0.61cm^2, mean grandient 75mmHg, peak gradiant 41mmHg)/  moderate thickened , mild calcified MV leaflets without stenosis, moderate MR/  severe LAE/ trivial PR and TR  . TRANSURETHRAL RESECTION OF BLADDER NECK N/A 12/07/2016   Procedure: TRANSURETHRAL RESECTION OF BLADDER NECK;  Surgeon: Cleon Gustin, MD;  Location: [facility];  Service: Urology;  Laterality: N/A;  . XI ROBOTIC ASSISTED SIMPLE PROSTATECTOMY N/A 11/04/2016   Procedure: XI ROBOTIC ASSISTED SIMPLE PROSTATECTOMY;  Surgeon: Cleon Gustin, MD;  Location: [facility];  Service: Urology;  Laterality: N/A;     Medications Prior to Admission: Prior to Admission medications   Medication Sig Start Date End Date Taking? Authorizing Provider  acetaminophen (TYLENOL) 500 MG tablet Take 500 mg by mouth daily as needed for moderate pain.   Yes [provider]  amiodarone (PACERONE) 200 MG tablet Take 200 mg by mouth daily.   Yes [provider]  apixaban (ELIQUIS) 5 MG TABS tablet Take 1 tablet (5 mg total) by mouth 2 (two) times daily. 02/04/17  Yes Lelon Perla, MD  beta carotene w/minerals (OCUVITE) tablet Take 1 tablet by mouth daily.   Yes [provider]  carbidopa-levodopa (SINEMET) 25-100 MG tablet Take 0.5 tab QHS x 1week, then 0.5 tab BID x1week, then 0.5 tab TID x 1week, then 1 tab TID Patient taking differently: Take 1 tablet by mouth 3 (three) times daily.  03/28/17  Yes Jaffe, Adam R, DO  Carboxymethylcellulose Sodium (THERATEARS OP) Apply 1 drop to eye daily as needed (dry eyes).   Yes [provider]  diltiazem (CARTIA XT) 120 MG 24 hr capsule Take 1  capsule (120 mg total) by mouth 2 (two) times daily. 01/20/17  Yes Lelon Perla, MD  docusate sodium (COLACE) 100 MG capsule Take 100 mg by mouth every morning.    Yes [provider]  ferrous sulfate 325 (65 FE) MG tablet Take 325 mg by mouth daily with breakfast.    Yes [provider]  finasteride (PROSCAR) 5 MG tablet Take 5 mg by mouth every morning.    Yes [provider]  lovastatin (MEVACOR) 20 MG tablet Take 1 tablet (20 mg total) by mouth daily with breakfast. 02/04/17  Yes Crenshaw, Denice Bors, MD  meclizine (ANTIVERT) 25 MG tablet Take 1 tablet (25 mg total) by mouth 3 (three) times daily as needed for dizziness. 08/05/16  Yes Fredia Sorrow, MD  Multiple Vitamin (MULTIVITAMIN WITH MINERALS) TABS tablet Take 1 tablet by mouth daily.   Yes [provider]  Probiotic Product (PROBIOTIC DAILY PO) Take 1 capsule by mouth daily.   Yes [provider]  traMADol (ULTRAM) 50 MG tablet Take 1 tablet (50 mg total) by mouth every 6 (six) hours as needed for severe pain. 02/21/17  Yes McKenzie, Candee Furbish, MD     Allergies:    Allergies  Allergen Reactions  . Toradol [Ketorolac Tromethamine] Other (See Comments)    Dizzy and light headed  . Hydrocodone Other (See Comments)    hallucinations  . Dutasteride Diarrhea    GI upset and incontinence    Social History:   Social History   Socioeconomic History  . Marital status: Married    Spouse name: Not on file  . Number of children: 2  . Years of education: Not on file  . Highest education level: Not on file  Social Needs  . Financial resource strain: Not on file  . Food insecurity - worry: Not on file  . Food insecurity - inability: Not on file  . Transportation needs - medical: Not on file  . Transportation needs - non-medical: Not on file  Occupational History  . 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 Smoking status: Former Smoker    Packs/day: 0.50     Years: 15.00    Pack years: 7.50    Last attempt to quit: 06/29/1971    Years since quitting: 45.9  . Smokeless tobacco: Never Used  Substance and Sexual Activity  . Alcohol use: Yes    Alcohol/week: 0.6 - 1.2 oz    Types: 1 - 2 Glasses of wine per week    Comment: alternates wine and liquor. 1/2 pint liquor/week  . Drug use: No  . 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 Not on file  Social History Narrative  . Not on file    Family History:   The patient's family history includes Alcohol abuse in his brother, father, and unknown relative; Bipolar disorder in his mother; Cancer in his brother, maternal grandfather, and sister; Coronary artery disease in his father; Glaucoma in his sister; Heart attack in his father; Heart disease in his mother; Hyperlipidemia in his father.    ROS:  Please see the history of present illness.  All other ROS reviewed and negative.     Physical Exam/Data:   Vitals:   06/10/17 1100 06/10/17 1130 06/10/17 1200 06/10/17 1415  BP: 116/77 121/81 119/77 140/78  Pulse: 63 63 62 68  Resp: 15 16 17    Temp:    (!) 97.5 F (36.4 C)  TempSrc:    Oral  SpO2: 98% 99% 100%   Weight:    141 lb (64 kg)  Height:       No intake or output data in the 24 hours ending 06/10/17 1427 Filed Weights   06/10/17 0829 06/10/17 1415  Weight: 135 lb (61.2 kg) 141 lb (64 kg)   Body mass index is 23.46 kg/m.  General:  Well nourished, well developed, in no acute distress HEENT: normal Neck: no JVD Vascular: No carotid bruits Cardiac:  normal S1, S2; RRR; soft murmur Lungs:  clear to auscultation bilaterally, no wheezing, rhonchi or rales  Abd: soft, nontender, no hepatomegaly  Ext: no edema Musculoskeletal:  No deformities, BUE and BLE  strength normal and equal Skin: warm and dry  Neuro:  CNs 2-12 intact, no focal abnormalities noted Psych:  Normal affect    EKG:   The ECG that was done 06/10/17 was personally reviewed and demonstrates sinus rhythm with   Relevant CV Studies:  Echo pending  Echo 10/06/16: Study Conclusions - Left ventricle: Wall thickness was increased in a pattern of mild   LVH. Systolic function was mildly to moderately reduced. The   estimated ejection fraction was in the range of 40% to 45%.   Diffuse hypokinesis. - Aortic valve: Moderately calcified annulus. Severely thickened,   severely calcified leaflets. There was mild to moderate stenosis.   Valve area (VTI): 0.61 cm^2. Valve area (Vmax): 0.52 cm^2. Valve   area (Vmean): 0.6 cm^2. - Mitral valve: There was moderate regurgitation. - Left atrium: The atrium was severely dilated.   Myoview 10/01/16:  There was no ST segment deviation noted during stress.  Findings consistent with ischemia.  This is a low risk study.   Small area of mid anterior wall ischema Images not gated due to afib  Laboratory Data:  Chemistry Recent Labs  Lab 06/10/17 0830  NA 133*  K 3.9  CL 98*  CO2 28  GLUCOSE 102*  BUN 18  CREATININE 1.14  CALCIUM 8.9  GFRNONAA 56*  GFRAA >60  ANIONGAP 7    No results for input(s): PROT, ALBUMIN, AST, ALT, ALKPHOS, BILITOT in the last 168 hours. Hematology Recent Labs  Lab 06/10/17 0830  WBC 9.9  RBC 4.87  HGB 14.3  HCT 42.3  MCV 86.9  MCH 29.4  MCHC 33.8  RDW 15.9*  PLT 393   Cardiac Enzymes Recent Labs  Lab 06/10/17 0830 06/10/17 1203  TROPONINI 1.62* 3.41*   No results for input(s): TROPIPOC in the last 168 hours.  BNPNo results for input(s): BNP, PROBNP in the last 168 hours.  DDimer No results for input(s): DDIMER in the last 168 hours.  Radiology/Studies:  Dg Chest 2 View  Result Date: 06/10/2017 CLINICAL DATA:  Chest pain.  Prior CABG. EXAM: CHEST  2 VIEW COMPARISON:  11/24/2016 . FINDINGS: Mediastinum hilar structures are stable. Stable calcified mediastinal hilar lymph nodes are present. Prior CABG. No  cardiomegaly. No pulmonary venous congestion. Mild bibasilar subsegmental atelectasis and/or scarring again noted. No focal alveolar infiltrate. No pleural effusion or pneumothorax. IMPRESSION: 1. Mild bibasilar subsegmental atelectasis and/or scarring again noted. No acute cardiopulmonary disease. Stable calcified mediastinal hilar lymph nodes again noted. 2.  Prior CABG.  Heart size normal. Electronically Signed   By: Marcello Moores  Register   On: 06/10/2017 09:05    Assessment and Plan:   1. Unstable angina, NSTEMI - troponin 1.62 --> 3.41 - EKG with possible anterior infarct, difficult to compare with previous tracings - Pt chest pain sounds like unstable angina that was relieved with ASA, morphine, and nitro paste. He is currently reporting a dull chest pain. Will discuss with attending, but will need a heart cath. He last took eliquis last night.    2. Paroxysmal Afib - continue amio and cardizem This patients CHA2DS2-VASc Score and unadjusted Ischemic Stroke Rate (% per year) is equal to 4.8 % stroke rate/year from a score of 4 (HTN, MI, age) - on heparin drip, holding eliquis   3. HLD, hx of CAD, s/p CABG - continue lovastatin - no ASA given eliquis   4. HTN - pressures have been controlled here    Severity of Illness: The appropriate patient status for  this patient is OBSERVATION. Observation status is judged to be reasonable and necessary in order to provide the required intensity of service to ensure the patient's safety. The patient's presenting symptoms, physical exam findings, and initial radiographic and laboratory data in the context of their medical condition is felt to place them at decreased risk for further clinical deterioration. Furthermore, it is anticipated that the patient will be medically stable for discharge from the hospital within 2 midnights of admission. The following factors support the patient status of observation.   " The patient's presenting symptoms  include  Unstable angina. " The physical exam findings include soft murmur on exam " The initial radiographic and laboratory data are elevated troponin     For questions or updates, please contact [place] Please consult [email or web address] for contact info under Cardiology/STEMI.    Signed, Ledora Bottcher, PA  06/10/2017 2:27 PM    History and all data above reviewed.  Patient examined.  I agree with the findings as above.  He had chest pain starting on Sunday.  This was substernal and moderate.  He had no further pain until he woke this morning.  8/10 with radiation to the left arm.  He did not have SLNTG.  He took ASA.  he came to [facility] ED.  Initial troponin was mildly elevated.  He had recurrent chest pain.  Troponin rose.  Now pain free and admitted to [place].  He did take Eliquis last night.  The patient the exam reveals COR:RRR  ,  Lungs: Clear  ,  Abd: Positive bowel sounds, no rebound no guarding, Ext No edema   .  All available labs, radiology testing, previous records reviewed. Agree with documented assessment and plan. The patient is admitted with NSTEMI:  Cannot get cathed today because of his Eliquis.   He is on for Monday.  However, would need urgent cath this weekend if he has severe symptoms that do not easily respond to treatment.   Admit with heparin, ASA,  Beta blocker.   Hold Eliquis.   Rayelle Armor  4:00 PM  06/10/2017

## 2017-06-11 ENCOUNTER — Encounter (HOSPITAL_COMMUNITY): Payer: Self-pay | Admitting: *Deleted

## 2017-06-11 LAB — ECHOCARDIOGRAM COMPLETE
HEIGHTINCHES: 65 in
WEIGHTICAEL: 2256 [oz_av]

## 2017-06-11 LAB — CBC
HCT: 35.3 % — ABNORMAL LOW (ref 39.0–52.0)
Hemoglobin: 11.7 g/dL — ABNORMAL LOW (ref 13.0–17.0)
MCH: 29.1 pg (ref 26.0–34.0)
MCHC: 33.1 g/dL (ref 30.0–36.0)
MCV: 87.8 fL (ref 78.0–100.0)
PLATELETS: 321 10*3/uL (ref 150–400)
RBC: 4.02 MIL/uL — ABNORMAL LOW (ref 4.22–5.81)
RDW: 15.5 % (ref 11.5–15.5)
WBC: 10.4 10*3/uL (ref 4.0–10.5)

## 2017-06-11 LAB — BASIC METABOLIC PANEL
Anion gap: 11 (ref 5–15)
BUN: 17 mg/dL (ref 6–20)
CALCIUM: 8.6 mg/dL — AB (ref 8.9–10.3)
CHLORIDE: 99 mmol/L — AB (ref 101–111)
CO2: 24 mmol/L (ref 22–32)
CREATININE: 1.04 mg/dL (ref 0.61–1.24)
GFR calc non Af Amer: >60 mL/min (ref >60)
GLUCOSE: 106 mg/dL — AB (ref 65–99)
Potassium: 3.9 mmol/L (ref 3.5–5.1)
Sodium: 134 mmol/L — ABNORMAL LOW (ref 135–145)

## 2017-06-11 LAB — LIPID PANEL
Cholesterol: 154 mg/dL (ref 0–200)
HDL: 76 mg/dL (ref >40)
LDL CALC: 70 mg/dL (ref 0–99)
TRIGLYCERIDES: 39 mg/dL (ref <150)
Total CHOL/HDL Ratio: 2 RATIO
VLDL: 8 mg/dL (ref 0–40)

## 2017-06-11 LAB — APTT: aPTT: 71 seconds — ABNORMAL HIGH (ref 24–36)

## 2017-06-11 LAB — HEPARIN LEVEL (UNFRACTIONATED): HEPARIN UNFRACTIONATED: 1.34 [IU]/mL — AB (ref 0.30–0.70)

## 2017-06-11 LAB — TROPONIN I
TROPONIN I: 23.24 ng/mL — AB (ref <0.03)
Troponin I: 28.48 ng/mL (ref <0.03)

## 2017-06-11 NOTE — Progress Notes (Signed)
ANTICOAGULATION CONSULT NOTE - Follow Up Consult  Pharmacy Consult for Heparin Indication: chest pain/ACS  Allergies  Allergen Reactions  . Toradol [Ketorolac Tromethamine] Other (See Comments)    Dizzy and light headed  . Hydrocodone Other (See Comments)    hallucinations  . Dutasteride Diarrhea    GI upset and incontinence    Patient Measurements: Height: 5\' 5"  (165.1 cm) Weight: 139 lb 5.3 oz (63.2 kg) IBW/kg (Calculated) : 61.5  Vital Signs: Temp: 97.7 F (36.5 C) (12/15 0509) Temp Source: Oral (12/15 0509) BP: 126/73 (12/14 2010) Pulse Rate: 55 (12/15 0509)  Labs: Recent Labs    06/10/17 0830  06/10/17 1539 06/10/17 2019 06/10/17 2249 06/11/17 0406  HGB 14.3  --   --   --   --  11.7*  HCT 42.3  --   --   --   --  35.3*  PLT 393  --   --   --   --  321  APTT 34  --   --  79*  --  71*  HEPARINUNFRC  --   --   --  1.86*  --  1.34*  CREATININE 1.14  --   --   --   --  1.04  TROPONINI 1.62*   < > 5.67*  --  28.48* 23.24*   < > = values in this interval not displayed.    Estimated Creatinine Clearance: 44.4 mL/min (by C-G formula based on SCr of 1.04 mg/dL).   Medications:  Heparin @ 750 units/hr  Assessment: 88yom continues on heparin for NSTEMI while eliquis on hold. Using aPTTs to guide heparin dosing as eliquis falsely elevates heparin levels. APTT therapeutic at 71 sec, and heparin level remains falsely elevated at 1.34 but downtrending; Not yet correlated.  CBC drop, no bleeding observed or noted from overnight RN.     Goal of Therapy: APTT 66-102 seconds  Heparin level 0.3-0.7 units/ml Monitor platelets by anticoagulation protocol: Yes   Plan:  1) Continue heparin gtt at 750 units/hr 2) Monitor daily heparin level and aPTT  Carnella Guadalajara 06/11/2017,7:16 AM

## 2017-06-11 NOTE — Progress Notes (Signed)
Progress Note  Patient Name: Justin [name] Date of Encounter: 06/11/2017  Primary Cardiologist: Kirk Ruths MD  Subjective   Feels well this am. No chest pain overnight. Reports first chest pain 6 days ago. Had 2 episodes yesterday and pain didn't resolve so he came to ED.   Inpatient Medications    Scheduled Meds: . amiodarone  200 mg Oral Daily  . aspirin EC  81 mg Oral Daily  . carbidopa-levodopa  1 tablet Oral TID  . diltiazem  120 mg Oral BID  . finasteride  5 mg Oral q morning - 10a  . metoprolol tartrate  12.5 mg Oral BID  . nitroGLYCERIN  0.5 inch Topical Q6H  . pravastatin  20 mg Oral q1800  . sodium chloride flush  3 mL Intravenous Q12H   Continuous Infusions: . sodium chloride    . heparin 750 Units/hr (06/10/17 0946)   PRN Meds: sodium chloride, acetaminophen, morphine injection, ondansetron (ZOFRAN) IV, sodium chloride flush, traMADol   Vital Signs    Vitals:   06/10/17 2010 06/11/17 0457 06/11/17 0509 06/11/17 1001  BP: 126/73   138/81  Pulse: 60  (!) 55 60  Resp: 12     Temp: 98.2 F (36.8 C)  97.7 F (36.5 C)   TempSrc: Oral  Oral   SpO2:   97%   Weight:  139 lb 5.3 oz (63.2 kg)    Height:        Intake/Output Summary (Last 24 hours) at 06/11/2017 1214 Last data filed at 06/11/2017 0842 Gross per 24 hour  Intake 394.75 ml  Output 200 ml  Net 194.75 ml   Filed Weights   06/10/17 0829 06/10/17 1415 06/11/17 0457  Weight: 135 lb (61.2 kg) 141 lb (64 kg) 139 lb 5.3 oz (63.2 kg)    Telemetry    NSR - Personally Reviewed  ECG    Sinus brady- LAFB. ST depression in V4-6.  Personally Reviewed  Physical Exam   GEN: No acute distress.   Neck: No JVD Cardiac: RRR, gr 2/6 harsh systolic murmur RUSB. Respiratory: Clear to auscultation bilaterally. GI: Soft, nontender, non-distended  MS: No edema; No deformity. Neuro:  Nonfocal  Psych: Normal affect   Labs    Chemistry Recent Labs  Lab 06/10/17 0830 06/11/17 0406  NA 133*  134*  K 3.9 3.9  CL 98* 99*  CO2 28 24  GLUCOSE 102* 106*  BUN 18 17  CREATININE 1.14 1.04  CALCIUM 8.9 8.6*  GFRNONAA 56* >60  GFRAA >60 >60  ANIONGAP 7 11     Hematology Recent Labs  Lab 06/10/17 0830 06/11/17 0406  WBC 9.9 10.4  RBC 4.87 4.02*  HGB 14.3 11.7*  HCT 42.3 35.3*  MCV 86.9 87.8  MCH 29.4 29.1  MCHC 33.8 33.1  RDW 15.9* 15.5  PLT 393 321    Cardiac Enzymes Recent Labs  Lab 06/10/17 1203 06/10/17 1539 06/10/17 2249 06/11/17 0406  TROPONINI 3.41* 5.67* 28.48* 23.24*   No results for input(s): TROPIPOC in the last 168 hours.   BNP Recent Labs  Lab 06/10/17 1539  BNP 423.1*     DDimer No results for input(s): DDIMER in the last 168 hours.   Radiology    Dg Chest 2 View  Result Date: 06/10/2017 CLINICAL DATA:  Chest pain.  Prior CABG. EXAM: CHEST  2 VIEW COMPARISON:  11/24/2016 . FINDINGS: Mediastinum hilar structures are stable. Stable calcified mediastinal hilar lymph nodes are present. Prior CABG. No cardiomegaly. No  pulmonary venous congestion. Mild bibasilar subsegmental atelectasis and/or scarring again noted. No focal alveolar infiltrate. No pleural effusion or pneumothorax. IMPRESSION: 1. Mild bibasilar subsegmental atelectasis and/or scarring again noted. No acute cardiopulmonary disease. Stable calcified mediastinal hilar lymph nodes again noted. 2.  Prior CABG.  Heart size normal. Electronically Signed   By: Marcello Moores  Register   On: 06/10/2017 09:05    Cardiac Studies   None   Patient Profile     83 y.o. male with a history of PAF (since 2016 initially not on AC due to gait disturbance and hx of syncope, now on eliquis), moderate AS, moderate MR, CAD s/p CABG 2001, HTN, HLD, and PVD. He was transferred from [facility] for NSTEMI.    Assessment & Plan    1. NSTEMI. Significant troponin elevation to 28.48. Now declining. Chest pain resolved and hemodynamically stable. Ecg shows new ST depression in lateral precordial  leads. Deferred cardiac cath yesterday since he did not meet STEMI criteria and he was fully anticoagulated on Eliquis. Eliquis on hold. Now on ASA and IV heparin. CP resolved. Will plan on cardiac cath on [facility] unless symptoms recur. 2. CAD s/p remote CABG in 2001.  3. Paroxysmal Afib. In NSR on amiodarone. Eliquis held for cardiac cath. 4. HTN. Controlled.  5. HDL on statin.   For questions or updates, please contact [place] Please consult [email or web address] for contact info under Cardiology/STEMI.      Signed, Jaiyla Granados [place], MD  06/11/2017, 12:14 PM

## 2017-06-12 DIAGNOSIS — R410 Disorientation, unspecified: Secondary | ICD-10-CM

## 2017-06-12 LAB — CBC
HCT: 36.9 % — ABNORMAL LOW (ref 39.0–52.0)
HEMOGLOBIN: 12.4 g/dL — AB (ref 13.0–17.0)
MCH: 29.4 pg (ref 26.0–34.0)
MCHC: 33.6 g/dL (ref 30.0–36.0)
MCV: 87.4 fL (ref 78.0–100.0)
PLATELETS: 346 10*3/uL (ref 150–400)
RBC: 4.22 MIL/uL (ref 4.22–5.81)
RDW: 15.4 % (ref 11.5–15.5)
WBC: 10.2 10*3/uL (ref 4.0–10.5)

## 2017-06-12 LAB — HEPARIN LEVEL (UNFRACTIONATED)
HEPARIN UNFRACTIONATED: 0.82 [IU]/mL — AB (ref 0.30–0.70)
Heparin Unfractionated: 0.95 IU/mL — ABNORMAL HIGH (ref 0.30–0.70)

## 2017-06-12 LAB — APTT
APTT: 53 s — AB (ref 24–36)
aPTT: 55 seconds — ABNORMAL HIGH (ref 24–36)

## 2017-06-12 MED ORDER — SODIUM CHLORIDE 0.9 % IV SOLN: 250.0000 mL | INTRAVENOUS | Status: DC | PRN

## 2017-06-12 MED ORDER — SODIUM CHLORIDE 0.9% FLUSH
3.0000 mL | Freq: Two times a day (BID) | INTRAVENOUS | Status: DC
  Administered 2017-06-13: 3 mL via INTRAVENOUS

## 2017-06-12 MED ORDER — SODIUM CHLORIDE 0.9 % WEIGHT BASED INFUSION
1.0000 mL/kg/h | INTRAVENOUS | Status: DC
  Administered 2017-06-13: 1 mL/kg/h via INTRAVENOUS

## 2017-06-12 MED ORDER — SODIUM CHLORIDE 0.9 % WEIGHT BASED INFUSION
3.0000 mL/kg/h | INTRAVENOUS | Status: DC
  Administered 2017-06-13: 3 mL/kg/h via INTRAVENOUS

## 2017-06-12 MED ORDER — ASPIRIN 81 MG PO CHEW
81.0000 mg | CHEWABLE_TABLET | ORAL | Status: AC
  Administered 2017-06-13: 81 mg via ORAL
  Filled 2017-06-12: qty 1

## 2017-06-12 MED ORDER — SODIUM CHLORIDE 0.9% FLUSH: 3.0000 mL | INTRAVENOUS | Status: DC | PRN

## 2017-06-12 NOTE — Progress Notes (Signed)
Patient noted with increased confusion and agitation at the beginning of the shift. He was combative, verbally abusive to staff and climbing out of bed. He sustained multiple skin tears to both hands while he was fighting and attempting to get out of bed. Reorientation was unsuccessful. Security called, but that did not help. Wife called and came to assist.  Patient has been calm and cooperative since wife came.  He is currently in bed with eyes opened.  Will continue to monitor patient.

## 2017-06-12 NOTE — Progress Notes (Signed)
Progress Note  Patient Name: Justin [name] Date of Encounter: 06/12/2017  Primary Cardiologist: Kirk Ruths MD  Subjective   Patient confused last night. Combative initially. Now calm with wife present. Denies any pain.  Inpatient Medications    Scheduled Meds: . amiodarone  200 mg Oral Daily  . aspirin EC  81 mg Oral Daily  . carbidopa-levodopa  1 tablet Oral TID  . diltiazem  120 mg Oral BID  . finasteride  5 mg Oral q morning - 10a  . metoprolol tartrate  12.5 mg Oral BID  . nitroGLYCERIN  0.5 inch Topical Q6H  . pravastatin  20 mg Oral q1800  . sodium chloride flush  3 mL Intravenous Q12H   Continuous Infusions: . sodium chloride    . heparin 850 Units/hr (06/12/17 0957)   PRN Meds: sodium chloride, acetaminophen, morphine injection, ondansetron (ZOFRAN) IV, sodium chloride flush   Vital Signs    Vitals:   06/11/17 1900 06/11/17 2144 06/12/17 0332 06/12/17 0913  BP: (!) 159/73 128/73 135/69 123/73  Pulse: 70  (!) 59   Resp:      Temp: 98 F (36.7 C)  98 F (36.7 C)   TempSrc: Oral  Oral   SpO2: 98%     Weight:   139 lb 12.8 oz (63.4 kg)   Height:        Intake/Output Summary (Last 24 hours) at 06/12/2017 1112 Last data filed at 06/12/2017 0824 Gross per 24 hour  Intake 960 ml  Output 325 ml  Net 635 ml   Filed Weights   06/10/17 1415 06/11/17 0457 06/12/17 0332  Weight: 141 lb (64 kg) 139 lb 5.3 oz (63.2 kg) 139 lb 12.8 oz (63.4 kg)    Telemetry    NSR - Personally Reviewed  ECG    Sinus brady- LAFB. ST depression in V4-6 slightly better today.  Personally Reviewed  Physical Exam   GENERAL:  Well appearing, elderly WM  HEENT:  PERRL, EOMI, sclera are clear. Oropharynx is clear. NECK:  No jugular venous distention, carotid upstroke brisk and symmetric, no bruits, no thyromegaly or adenopathy LUNGS:  Clear to auscultation bilaterally CHEST:  Unremarkable HEART:  RRR,  PMI not displaced or sustained,S1 and S2 within normal limits, no  S3, no S4: no clicks, no rubs, no murmurs ABD:  Soft, nontender. BS +, no masses or bruits. No hepatomegaly, no splenomegaly EXT:  2 + pulses throughout, no edema, no cyanosis no clubbing SKIN:  Warm and dry.  No rashes NEURO:  Alert but oriented to self only. Thinks there is a baseball game and car race taking place out his window. Doesn't know where he is [employer] shop". Thinks he is in [place].  Cranial nerves II through XII intact. PSYCH:  Cognitively intact    Labs    Chemistry Recent Labs  Lab 06/10/17 0830 06/11/17 0406  NA 133* 134*  K 3.9 3.9  CL 98* 99*  CO2 28 24  GLUCOSE 102* 106*  BUN 18 17  CREATININE 1.14 1.04  CALCIUM 8.9 8.6*  GFRNONAA 56* >60  GFRAA >60 >60  ANIONGAP 7 11     Hematology Recent Labs  Lab 06/10/17 0830 06/11/17 0406 06/12/17 0808  WBC 9.9 10.4 10.2  RBC 4.87 4.02* 4.22  HGB 14.3 11.7* 12.4*  HCT 42.3 35.3* 36.9*  MCV 86.9 87.8 87.4  MCH 29.4 29.1 29.4  MCHC 33.8 33.1 33.6  RDW 15.9* 15.5 15.4  PLT 393 321 346  Cardiac Enzymes Recent Labs  Lab 06/10/17 1203 06/10/17 1539 06/10/17 2249 06/11/17 0406  TROPONINI 3.41* 5.67* 28.48* 23.24*   No results for input(s): TROPIPOC in the last 168 hours.   BNP Recent Labs  Lab 06/10/17 1539  BNP 423.1*     DDimer No results for input(s): DDIMER in the last 168 hours.   Radiology    No results found.  Cardiac Studies   None   Patient Profile     83 y.o. male with a history of PAF (since 2016 initially not on AC due to gait disturbance and hx of syncope, now on eliquis), moderate AS, moderate MR, CAD s/p CABG 2001, HTN, HLD, and PVD. He was transferred from [facility] for NSTEMI.    Assessment & Plan    1. NSTEMI. Significant troponin elevation to 28.48. Now declining. Chest pain resolved and hemodynamically stable. Ecg shows new ST depression in lateral precordial leads. Eliquis on hold. Now on ASA and IV heparin. CP resolved. Will plan on cardiac cath on  Monday. 2. CAD s/p remote CABG in 2001.  3. Paroxysmal Afib. In NSR on amiodarone. Eliquis held for cardiac cath. 4. HTN. Controlled.  5. HDL on statin.  6. Delirium. Wife reports similar problems in the past with surgery and with tramadol. He did receive a dose of Tramadol early yesterday am. Will avoid any tramadol, narcotics, sedatives. Observe for now.   For questions or updates, please contact [place] Please consult [email or web address] for contact info under Cardiology/STEMI.      Signed, Wakeelah Solan [place], MD  06/12/2017, 11:12 AM

## 2017-06-12 NOTE — Progress Notes (Signed)
ANTICOAGULATION CONSULT NOTE - Follow Up Consult  Pharmacy Consult for Heparin Indication: chest pain/ACS  Allergies  Allergen Reactions  . Toradol [Ketorolac Tromethamine] Other (See Comments)    Dizzy and light headed  . Hydrocodone Other (See Comments)    hallucinations  . Dutasteride Diarrhea    GI upset and incontinence    Patient Measurements: Height: 5\' 5"  (165.1 cm) Weight: 139 lb 12.8 oz (63.4 kg) IBW/kg (Calculated) : 61.5  Vital Signs: Temp: 97.8 F (36.6 C) (12/16 1222) Temp Source: Oral (12/16 1222) BP: 129/69 (12/16 1222) Pulse Rate: 60 (12/16 1222)  Labs: Recent Labs    06/10/17 0830  06/10/17 1539  06/10/17 2249 06/11/17 0406 06/12/17 0808 06/12/17 1727  HGB 14.3  --   --   --   --  11.7* 12.4*  --   HCT 42.3  --   --   --   --  35.3* 36.9*  --   PLT 393  --   --   --   --  321 346  --   APTT 34  --   --    < >  --  71* 53* 55*  HEPARINUNFRC  --   --   --    < >  --  1.34* 0.82* 0.95*  CREATININE 1.14  --   --   --   --  1.04  --   --   TROPONINI 1.62*   < > 5.67*  --  28.48* 23.24*  --   --    < > = values in this interval not displayed.    Estimated Creatinine Clearance: 44.4 mL/min (by C-G formula based on SCr of 1.04 mg/dL).   Medications:  Heparin @ 850 units/hr  Assessment: 88yom continues on heparin for NSTEMI while eliquis on hold. Using aPTTs to guide heparin dosing as eliquis falsely elevates heparin levels. APTT remains subtherapeutic at 55 sec, and heparin level remains falsely elevated at 0.95 but downtrending; Not yet correlated.  CBC stabilized and no bleeding observed.  Goal of Therapy: APTT 66-102 seconds  Heparin level 0.3-0.7 units/ml Monitor platelets by anticoagulation protocol: Yes   Plan:  Increase heparin gtt to 1000 units/hr Follow up 0300 aPTT Monitor daily heparin level and aPTT  Albertina Parr, PharmD., BCPS Clinical Pharmacist Pager [phone]

## 2017-06-12 NOTE — Progress Notes (Signed)
ANTICOAGULATION CONSULT NOTE - Follow Up Consult  Pharmacy Consult for Heparin Indication: chest pain/ACS  Allergies  Allergen Reactions  . Toradol [Ketorolac Tromethamine] Other (See Comments)    Dizzy and light headed  . Hydrocodone Other (See Comments)    hallucinations  . Dutasteride Diarrhea    GI upset and incontinence    Patient Measurements: Height: 5\' 5"  (165.1 cm) Weight: 139 lb 12.8 oz (63.4 kg) IBW/kg (Calculated) : 61.5  Vital Signs: Temp: 98 F (36.7 C) (12/16 0332) Temp Source: Oral (12/16 0332) BP: 135/69 (12/16 0332) Pulse Rate: 59 (12/16 0332)  Labs: Recent Labs    06/10/17 0830  06/10/17 1539 06/10/17 2019 06/10/17 2249 06/11/17 0406 06/12/17 0808  HGB 14.3  --   --   --   --  11.7* 12.4*  HCT 42.3  --   --   --   --  35.3* 36.9*  PLT 393  --   --   --   --  321 346  APTT 34  --   --  79*  --  71* 53*  HEPARINUNFRC  --   --   --  1.86*  --  1.34* 0.82*  CREATININE 1.14  --   --   --   --  1.04  --   TROPONINI 1.62*   < > 5.67*  --  28.48* 23.24*  --    < > = values in this interval not displayed.    Estimated Creatinine Clearance: 44.4 mL/min (by C-G formula based on SCr of 1.04 mg/dL).   Medications:  Heparin @ 750 units/hr  Assessment: 88yom continues on heparin for NSTEMI while eliquis on hold. Using aPTTs to guide heparin dosing as eliquis falsely elevates heparin levels. APTT subtherapeutic at 53 sec, and heparin level remains falsely elevated at 0.82 but downtrending; Not yet correlated.  CBC stabilized and no bleeding observed.      Goal of Therapy: APTT 66-102 seconds  Heparin level 0.3-0.7 units/ml Monitor platelets by anticoagulation protocol: Yes   Plan:  Increase heparin gtt to 850 units/hr Follow up 1800 aPTT Monitor daily heparin level and aPTT  Bertis Ruddy M 06/12/2017,9:10 AM

## 2017-06-13 ENCOUNTER — Inpatient Hospital Stay (HOSPITAL_COMMUNITY)
Admission: EM | Disposition: A | Discharge status: Home / Self Care | Payer: Self-pay | Source: Home / Self Care | Attending: Cardiology

## 2017-06-13 ENCOUNTER — Inpatient Hospital Stay (HOSPITAL_COMMUNITY): Payer: PPO

## 2017-06-13 ENCOUNTER — Encounter (HOSPITAL_COMMUNITY): Payer: Self-pay | Admitting: Cardiovascular Disease

## 2017-06-13 DIAGNOSIS — I25810 Atherosclerosis of coronary artery bypass graft(s) without angina pectoris: Secondary | ICD-10-CM

## 2017-06-13 DIAGNOSIS — I214 Non-ST elevation (NSTEMI) myocardial infarction: Secondary | ICD-10-CM | POA: Diagnosis present

## 2017-06-13 HISTORY — PX: CORONARY STENT INTERVENTION: CATH118234

## 2017-06-13 HISTORY — PX: LEFT HEART CATH AND CORS/GRAFTS ANGIOGRAPHY: CATH118250

## 2017-06-13 LAB — BASIC METABOLIC PANEL
ANION GAP: 8 (ref 5–15)
BUN: 22 mg/dL — ABNORMAL HIGH (ref 6–20)
CALCIUM: 8.6 mg/dL — AB (ref 8.9–10.3)
CO2: 25 mmol/L (ref 22–32)
Chloride: 102 mmol/L (ref 101–111)
Creatinine, Ser: 1.05 mg/dL (ref 0.61–1.24)
GFR calc Af Amer: >60 mL/min (ref >60)
Glucose, Bld: 104 mg/dL — ABNORMAL HIGH (ref 65–99)
Potassium: 3.6 mmol/L (ref 3.5–5.1)
Sodium: 135 mmol/L (ref 135–145)

## 2017-06-13 LAB — POCT ACTIVATED CLOTTING TIME: Activated Clotting Time: 323 seconds

## 2017-06-13 LAB — CBC
HEMATOCRIT: 31.3 % — AB (ref 39.0–52.0)
Hemoglobin: 10.3 g/dL — ABNORMAL LOW (ref 13.0–17.0)
MCH: 28.9 pg (ref 26.0–34.0)
MCHC: 32.9 g/dL (ref 30.0–36.0)
MCV: 87.7 fL (ref 78.0–100.0)
PLATELETS: 323 10*3/uL (ref 150–400)
RBC: 3.57 MIL/uL — ABNORMAL LOW (ref 4.22–5.81)
RDW: 15.4 % (ref 11.5–15.5)
WBC: 9.3 10*3/uL (ref 4.0–10.5)

## 2017-06-13 LAB — APTT: aPTT: 88 seconds — ABNORMAL HIGH (ref 24–36)

## 2017-06-13 SURGERY — LEFT HEART CATH AND CORS/GRAFTS ANGIOGRAPHY
Anesthesia: LOCAL

## 2017-06-13 MED ORDER — NITROGLYCERIN 1 MG/10 ML FOR IR/CATH LAB
INTRA_ARTERIAL | Status: AC
  Filled 2017-06-13: qty 10

## 2017-06-13 MED ORDER — FAMOTIDINE IN NACL 20-0.9 MG/50ML-% IV SOLN
INTRAVENOUS | Status: AC | PRN
  Administered 2017-06-13: 20 mg via INTRAVENOUS

## 2017-06-13 MED ORDER — BIVALIRUDIN BOLUS VIA INFUSION - CUPID
INTRAVENOUS | Status: DC | PRN
  Administered 2017-06-13: 47.775 mg via INTRAVENOUS

## 2017-06-13 MED ORDER — SODIUM CHLORIDE 0.9 % IV SOLN: 250.0000 mL | INTRAVENOUS | Status: DC | PRN

## 2017-06-13 MED ORDER — IOPAMIDOL (ISOVUE-370) INJECTION 76%
INTRAVENOUS | Status: DC | PRN
  Administered 2017-06-13: 145 mL via INTRA_ARTERIAL

## 2017-06-13 MED ORDER — HYDRALAZINE HCL 20 MG/ML IJ SOLN
10.0000 mg | INTRAMUSCULAR | Status: DC | PRN
Start: 2017-06-13 — End: 2017-06-14

## 2017-06-13 MED ORDER — CLOPIDOGREL BISULFATE 300 MG PO TABS
ORAL_TABLET | ORAL | Status: AC
  Filled 2017-06-13: qty 1

## 2017-06-13 MED ORDER — CLOPIDOGREL BISULFATE 75 MG PO TABS
75.0000 mg | ORAL_TABLET | Freq: Every day | ORAL | Status: DC
  Administered 2017-06-14: 75 mg via ORAL
  Filled 2017-06-13: qty 1

## 2017-06-13 MED ORDER — LABETALOL HCL 5 MG/ML IV SOLN: 10.0000 mg | INTRAVENOUS | Status: AC | PRN

## 2017-06-13 MED ORDER — HYDRALAZINE HCL 20 MG/ML IJ SOLN: 5.0000 mg | INTRAMUSCULAR | Status: AC | PRN

## 2017-06-13 MED ORDER — SODIUM CHLORIDE 0.9 % IV SOLN
1.7500 mg/kg/h | INTRAVENOUS | Status: AC
  Administered 2017-06-13: 1.75 mg/kg/h via INTRAVENOUS
  Filled 2017-06-13: qty 250

## 2017-06-13 MED ORDER — SODIUM CHLORIDE 0.9% FLUSH: 3.0000 mL | Freq: Two times a day (BID) | INTRAVENOUS | Status: DC

## 2017-06-13 MED ORDER — SODIUM CHLORIDE 0.9% FLUSH: 3.0000 mL | INTRAVENOUS | Status: DC | PRN

## 2017-06-13 MED ORDER — LIDOCAINE HCL (PF) 1 % IJ SOLN
INTRAMUSCULAR | Status: AC
  Filled 2017-06-13: qty 30

## 2017-06-13 MED ORDER — HEPARIN (PORCINE) IN NACL 2-0.9 UNIT/ML-% IJ SOLN
INTRAMUSCULAR | Status: AC | PRN
  Administered 2017-06-13: 1000 mL

## 2017-06-13 MED ORDER — SODIUM CHLORIDE 0.9 % IV SOLN
INTRAVENOUS | Status: AC
  Administered 2017-06-13: 12:00:00 via INTRAVENOUS

## 2017-06-13 MED ORDER — FAMOTIDINE IN NACL 20-0.9 MG/50ML-% IV SOLN
INTRAVENOUS | Status: AC
  Filled 2017-06-13: qty 50

## 2017-06-13 MED ORDER — CLOPIDOGREL BISULFATE 300 MG PO TABS
ORAL_TABLET | ORAL | Status: DC | PRN
  Administered 2017-06-13: 600 mg via ORAL

## 2017-06-13 MED ORDER — MORPHINE SULFATE (PF) 4 MG/ML IV SOLN: 2.0000 mg | INTRAVENOUS | Status: DC | PRN

## 2017-06-13 MED ORDER — ACETAMINOPHEN 325 MG PO TABS
650.0000 mg | ORAL_TABLET | ORAL | Status: DC | PRN
Start: 2017-06-13 — End: 2017-06-14
  Administered 2017-06-13: 650 mg via ORAL
  Filled 2017-06-13: qty 2

## 2017-06-13 MED ORDER — BIVALIRUDIN TRIFLUOROACETATE 250 MG IV SOLR
INTRAVENOUS | Status: AC
  Filled 2017-06-13: qty 250

## 2017-06-13 MED ORDER — SODIUM CHLORIDE 0.9 % IV SOLN
INTRAVENOUS | Status: AC | PRN
  Administered 2017-06-13: 1.75 mg/kg/h
  Administered 2017-06-13: 1.75 mg/kg/h via INTRAVENOUS

## 2017-06-13 MED ORDER — LIDOCAINE HCL (PF) 1 % IJ SOLN
INTRAMUSCULAR | Status: DC | PRN
  Administered 2017-06-13: 22 mL

## 2017-06-13 MED ORDER — ASPIRIN 81 MG PO CHEW
81.0000 mg | CHEWABLE_TABLET | Freq: Every day | ORAL | Status: DC
  Administered 2017-06-14: 10:00:00 81 mg via ORAL
  Filled 2017-06-13: qty 1

## 2017-06-13 MED ORDER — HEPARIN (PORCINE) IN NACL 2-0.9 UNIT/ML-% IJ SOLN
INTRAMUSCULAR | Status: AC
  Filled 2017-06-13: qty 1000

## 2017-06-13 MED ORDER — ONDANSETRON HCL 4 MG/2ML IJ SOLN: 4.0000 mg | Freq: Four times a day (QID) | INTRAMUSCULAR | Status: DC | PRN

## 2017-06-13 SURGICAL SUPPLY — 20 items
BALLN EMERGE MR 2.0X12 (BALLOONS) ×2
BALLOON EMERGE MR 2.0X12 (BALLOONS) ×1 IMPLANT
CATH INFINITI 5 FR RCB (CATHETERS) ×2 IMPLANT
CATH INFINITI 5FR JL5 (CATHETERS) ×2 IMPLANT
CATH INFINITI 5FR MULTPACK ANG (CATHETERS) ×2 IMPLANT
CATH LAUNCHER 6FR JR4 (CATHETERS) ×2 IMPLANT
CATH VISTA GUIDE 6FR JR4 (CATHETERS) ×2 IMPLANT
CATH VISTA GUIDE 6FR LCB (CATHETERS) ×2 IMPLANT
KIT ENCORE 26 ADVANTAGE (KITS) ×2 IMPLANT
KIT HEART LEFT (KITS) ×2 IMPLANT
PACK CARDIAC CATHETERIZATION (CUSTOM PROCEDURE TRAY) ×2 IMPLANT
SHEATH PINNACLE 5F 10CM (SHEATH) ×2 IMPLANT
SHEATH PINNACLE 6F 10CM (SHEATH) ×2 IMPLANT
STENT SYNERGY DES 2.5X16 (Permanent Stent) ×2 IMPLANT
SYR MEDRAD MARK V 150ML (SYRINGE) ×2 IMPLANT
TRANSDUCER W/STOPCOCK (MISCELLANEOUS) ×2 IMPLANT
TUBING CIL FLEX 10 FLL-RA (TUBING) ×2 IMPLANT
WIRE ASAHI PROWATER 180CM (WIRE) ×2 IMPLANT
WIRE EMERALD 3MM-J .035X150CM (WIRE) ×2 IMPLANT
WIRE HI TORQ VERSACORE-J 145CM (WIRE) ×2 IMPLANT

## 2017-06-13 NOTE — Progress Notes (Signed)
ANTICOAGULATION CONSULT NOTE - Follow Up Consult  Pharmacy Consult for Heparin Indication: chest pain/ACS  Allergies  Allergen Reactions  . Toradol [Ketorolac Tromethamine] Other (See Comments)    Dizzy and light headed  . Hydrocodone Other (See Comments)    hallucinations  . Dutasteride Diarrhea    GI upset and incontinence    Patient Measurements: Height: 5\' 5"  (165.1 cm) Weight: 140 lb 8 oz (63.7 kg) IBW/kg (Calculated) : 61.5  Vital Signs: Temp: 97.8 F (36.6 C) (12/17 0347) Temp Source: Oral (12/17 0347) BP: 114/62 (12/17 0347) Pulse Rate: 55 (12/17 0347)  Labs: Recent Labs    06/10/17 0830  06/10/17 1539  06/10/17 2249 06/11/17 0406 06/12/17 0808 06/12/17 1727 06/13/17 0325 06/13/17 0720  HGB 14.3  --   --   --   --  11.7* 12.4*  --  10.3*  --   HCT 42.3  --   --   --   --  35.3* 36.9*  --  31.3*  --   PLT 393  --   --   --   --  321 346  --  323  --   APTT 34  --   --    < >  --  71* 53* 55*  --  88*  HEPARINUNFRC  --   --   --    < >  --  1.34* 0.82* 0.95*  --   --   CREATININE 1.14  --   --   --   --  1.04  --   --  1.05  --   TROPONINI 1.62*   < > 5.67*  --  28.48* 23.24*  --   --   --   --    < > = values in this interval not displayed.    Estimated Creatinine Clearance: 43.9 mL/min (by C-G formula based on SCr of 1.05 mg/dL).   Medications:  Heparin @ 1000 units/hr  Assessment: 88yom continues on heparin for NSTEMI while eliquis on hold. Using aPTTs to guide heparin dosing as eliquis falsely elevates heparin levels. APTT therapeutic at 88 seconds. Hgb 12.4>10.3, platelets ok. Plan for cath today.  Goal of Therapy: APTT 66-102 seconds  Heparin level 0.3-0.7 units/ml Monitor platelets by anticoagulation protocol: Yes   Plan:  1) Continue heparin at 1000 units/hr 2) Follow up after cath  Deboraha Sprang 06/13/2017,8:18 AM

## 2017-06-13 NOTE — H&P (View-Only) (Signed)
Progress Note  Patient Name: Justin [name] Date of Encounter: 06/13/2017  Primary Cardiologist: Dr Stanford Breed  Subjective   Pt denies CP; no dyspnea; much less confused (pt's wife states previous confusion likely related to MSO4)  Inpatient Medications    Scheduled Meds: . amiodarone  200 mg Oral Daily  . aspirin EC  81 mg Oral Daily  . carbidopa-levodopa  1 tablet Oral TID  . diltiazem  120 mg Oral BID  . finasteride  5 mg Oral q morning - 10a  . metoprolol tartrate  12.5 mg Oral BID  . nitroGLYCERIN  0.5 inch Topical Q6H  . pravastatin  20 mg Oral q1800  . sodium chloride flush  3 mL Intravenous Q12H  . sodium chloride flush  3 mL Intravenous Q12H   Continuous Infusions: . sodium chloride    . sodium chloride    . sodium chloride    . heparin 1,000 Units/hr (06/12/17 1852)   PRN Meds: sodium chloride, sodium chloride, acetaminophen, morphine injection, ondansetron (ZOFRAN) IV, sodium chloride flush, sodium chloride flush   Vital Signs    Vitals:   06/12/17 1222 06/12/17 1932 06/12/17 2008 06/13/17 0347  BP: 129/69 107/69 119/69 114/62  Pulse: 60 65 66 (!) 55  Resp: 18 18 18 16   Temp: 97.8 F (36.6 C) 98 F (36.7 C) 98 F (36.7 C) 97.8 F (36.6 C)  TempSrc: Oral Oral Oral Oral  SpO2: 98%  98% 97%  Weight:    140 lb 8 oz (63.7 kg)  Height:        Intake/Output Summary (Last 24 hours) at 06/13/2017 0725 Last data filed at 06/13/2017 0653 Gross per 24 hour  Intake 1461.61 ml  Output 200 ml  Net 1261.61 ml   Filed Weights   06/11/17 0457 06/12/17 0332 06/13/17 0347  Weight: 139 lb 5.3 oz (63.2 kg) 139 lb 12.8 oz (63.4 kg) 140 lb 8 oz (63.7 kg)    Telemetry    Sinus - Personally Reviewed   Physical Exam   GEN: No acute distress.  Frail Neck: No JVD Cardiac: RRR, 3/6 systolic murmur Respiratory: Clear to auscultation bilaterally. GI: Soft, nontender, non-distended  MS: No edema; No deformity. Neuro:  Nonfocal, A and O x 3 Psych: Normal  affect   Labs    Chemistry Recent Labs  Lab 06/10/17 0830 06/11/17 0406 06/13/17 0325  NA 133* 134* 135  K 3.9 3.9 3.6  CL 98* 99* 102  CO2 28 24 25   GLUCOSE 102* 106* 104*  BUN 18 17 22*  CREATININE 1.14 1.04 1.05  CALCIUM 8.9 8.6* 8.6*  GFRNONAA 56* >60 >60  GFRAA >60 >60 >60  ANIONGAP 7 11 8      Hematology Recent Labs  Lab 06/11/17 0406 06/12/17 0808 06/13/17 0325  WBC 10.4 10.2 9.3  RBC 4.02* 4.22 3.57*  HGB 11.7* 12.4* 10.3*  HCT 35.3* 36.9* 31.3*  MCV 87.8 87.4 87.7  MCH 29.1 29.4 28.9  MCHC 33.1 33.6 32.9  RDW 15.5 15.4 15.4  PLT 321 346 323    Cardiac Enzymes Recent Labs  Lab 06/10/17 1203 06/10/17 1539 06/10/17 2249 06/11/17 0406  TROPONINI 3.41* 5.67* 28.48* 23.24*    BNP Recent Labs  Lab 06/10/17 1539  BNP 423.1*     Patient Profile     83 y.o. male admitted with NSTEMI. Echo shows EF 40-45, moderate AS and mild MR  Assessment & Plan    1 NSTEMI-continue ASA, heparin, statin and metoprolol. Mental status much  improved and likely related to combination of MSO4 and sundowning. Plan cath today (risks and benefits including MI, death and CVA discussed and pt agrees to proceed).  2 PAF-remains in sinus; continue amiodarone; if PCI required, would likely need to hold apixaban for at least time pt is on dual antiplt therapy due to risk of bleeding.  3 Confusion-improved; likely related to meds and sundowning. Pt slid out of bed earlier but no obvious trauma; wife was with him and states he did not hit head.  4 Hyperlipidemia-continue statin.   5 CAD (s/p CABG)-continue ASA and statin.  6 Htn-BP controlled; continue present meds  7 AS-moderate on echo.  For questions or updates, please contact [place] Please consult [email or web address] for contact info under Cardiology/STEMI.      Signed, Kirk Ruths, MD  06/13/2017, 7:25 AM

## 2017-06-13 NOTE — Progress Notes (Signed)
Per patient's wife, patient slid from the recliner while he was reaching for something from the floor. Upon entering the room patient was noted sitting on the floor near the recliner. He sustained abrasion to right posterior forearm. Area cleansed with saline then covered with guaze. Patient denies pain. On call Dr. Eula Fried made aware. Order received for right elbow and wrist xray. Wife remains at bedside.  Patient is currently in bed with eyes closed.  Will continue to monitor.

## 2017-06-13 NOTE — Progress Notes (Signed)
Site area: right groin  Site Prior to Removal:  Level 0  Pressure Applied For 20 MINUTES    Minutes Beginning at 1730  Manual:   Yes.    Patient Status During Pull:  stable  Post Pull Groin Site:  Level 0  Post Pull Instructions Given:  Yes.    Post Pull Pulses Present:  Yes.    Dressing Applied:  Yes.    Comments:

## 2017-06-13 NOTE — Interval H&P Note (Signed)
Cath Lab Visit (complete for each Cath Lab visit)  Clinical Evaluation Leading to the Procedure:   ACS: Yes.    Non-ACS:    Anginal Classification: CCS III  Anti-ischemic medical therapy: Minimal Therapy (1 class of medications)  Non-Invasive Test Results: No non-invasive testing performed  Prior CABG: Previous CABG      History and Physical Interval Note:  06/13/2017 9:43 AM  [name]  has presented today for surgery, with the diagnosis of NSTEMI  The various methods of treatment have been discussed with the patient and family. After consideration of risks, benefits and other options for treatment, the patient has consented to  Procedure(s): LEFT HEART CATH AND CORS/GRAFTS ANGIOGRAPHY (N/A) as a surgical intervention .  The patient's history has been reviewed, patient examined, no change in status, stable for surgery.  I have reviewed the patient's chart and labs.  Questions were answered to the patient's satisfaction.     Quay Burow

## 2017-06-13 NOTE — Progress Notes (Signed)
Pt left the unit to cath lab. Wife present at the bedside at the of transfer. Elita Boone, BSN, RN

## 2017-06-13 NOTE — Progress Notes (Signed)
Progress Note  Patient Name: Justin [name] Date of Encounter: 06/13/2017  Primary Cardiologist: Dr Stanford Breed  Subjective   Pt denies CP; no dyspnea; much less confused (pt's wife states previous confusion likely related to MSO4)  Inpatient Medications    Scheduled Meds: . amiodarone  200 mg Oral Daily  . aspirin EC  81 mg Oral Daily  . carbidopa-levodopa  1 tablet Oral TID  . diltiazem  120 mg Oral BID  . finasteride  5 mg Oral q morning - 10a  . metoprolol tartrate  12.5 mg Oral BID  . nitroGLYCERIN  0.5 inch Topical Q6H  . pravastatin  20 mg Oral q1800  . sodium chloride flush  3 mL Intravenous Q12H  . sodium chloride flush  3 mL Intravenous Q12H   Continuous Infusions: . sodium chloride    . sodium chloride    . sodium chloride    . heparin 1,000 Units/hr (06/12/17 1852)   PRN Meds: sodium chloride, sodium chloride, acetaminophen, morphine injection, ondansetron (ZOFRAN) IV, sodium chloride flush, sodium chloride flush   Vital Signs    Vitals:   06/12/17 1222 06/12/17 1932 06/12/17 2008 06/13/17 0347  BP: 129/69 107/69 119/69 114/62  Pulse: 60 65 66 (!) 55  Resp: 18 18 18 16   Temp: 97.8 F (36.6 C) 98 F (36.7 C) 98 F (36.7 C) 97.8 F (36.6 C)  TempSrc: Oral Oral Oral Oral  SpO2: 98%  98% 97%  Weight:    140 lb 8 oz (63.7 kg)  Height:        Intake/Output Summary (Last 24 hours) at 06/13/2017 0725 Last data filed at 06/13/2017 0653 Gross per 24 hour  Intake 1461.61 ml  Output 200 ml  Net 1261.61 ml   Filed Weights   06/11/17 0457 06/12/17 0332 06/13/17 0347  Weight: 139 lb 5.3 oz (63.2 kg) 139 lb 12.8 oz (63.4 kg) 140 lb 8 oz (63.7 kg)    Telemetry    Sinus - Personally Reviewed   Physical Exam   GEN: No acute distress.  Frail Neck: No JVD Cardiac: RRR, 3/6 systolic murmur Respiratory: Clear to auscultation bilaterally. GI: Soft, nontender, non-distended  MS: No edema; No deformity. Neuro:  Nonfocal, A and O x 3 Psych: Normal  affect   Labs    Chemistry Recent Labs  Lab 06/10/17 0830 06/11/17 0406 06/13/17 0325  NA 133* 134* 135  K 3.9 3.9 3.6  CL 98* 99* 102  CO2 28 24 25   GLUCOSE 102* 106* 104*  BUN 18 17 22*  CREATININE 1.14 1.04 1.05  CALCIUM 8.9 8.6* 8.6*  GFRNONAA 56* >60 >60  GFRAA >60 >60 >60  ANIONGAP 7 11 8      Hematology Recent Labs  Lab 06/11/17 0406 06/12/17 0808 06/13/17 0325  WBC 10.4 10.2 9.3  RBC 4.02* 4.22 3.57*  HGB 11.7* 12.4* 10.3*  HCT 35.3* 36.9* 31.3*  MCV 87.8 87.4 87.7  MCH 29.1 29.4 28.9  MCHC 33.1 33.6 32.9  RDW 15.5 15.4 15.4  PLT 321 346 323    Cardiac Enzymes Recent Labs  Lab 06/10/17 1203 06/10/17 1539 06/10/17 2249 06/11/17 0406  TROPONINI 3.41* 5.67* 28.48* 23.24*    BNP Recent Labs  Lab 06/10/17 1539  BNP 423.1*     Patient Profile     83 y.o. male admitted with NSTEMI. Echo shows EF 40-45, moderate AS and mild MR  Assessment & Plan    1 NSTEMI-continue ASA, heparin, statin and metoprolol. Mental status much  improved and likely related to combination of MSO4 and sundowning. Plan cath today (risks and benefits including MI, death and CVA discussed and pt agrees to proceed).  2 PAF-remains in sinus; continue amiodarone; if PCI required, would likely need to hold apixaban for at least time pt is on dual antiplt therapy due to risk of bleeding.  3 Confusion-improved; likely related to meds and sundowning. Pt slid out of bed earlier but no obvious trauma; wife was with him and states he did not hit head.  4 Hyperlipidemia-continue statin.   5 CAD (s/p CABG)-continue ASA and statin.  6 Htn-BP controlled; continue present meds  7 AS-moderate on echo.  For questions or updates, please contact [place] Please consult [email or web address] for contact info under Cardiology/STEMI.      Signed, Kirk Ruths, MD  06/13/2017, 7:25 AM

## 2017-06-14 ENCOUNTER — Telehealth: Payer: Self-pay | Admitting: Cardiology

## 2017-06-14 LAB — BASIC METABOLIC PANEL
Anion gap: 9 (ref 5–15)
BUN: 15 mg/dL (ref 6–20)
CALCIUM: 8.6 mg/dL — AB (ref 8.9–10.3)
CHLORIDE: 104 mmol/L (ref 101–111)
CO2: 22 mmol/L (ref 22–32)
CREATININE: 1.03 mg/dL (ref 0.61–1.24)
Glucose, Bld: 99 mg/dL (ref 65–99)
Potassium: 3.9 mmol/L (ref 3.5–5.1)
SODIUM: 135 mmol/L (ref 135–145)

## 2017-06-14 LAB — CBC
HCT: 31.9 % — ABNORMAL LOW (ref 39.0–52.0)
Hemoglobin: 10.9 g/dL — ABNORMAL LOW (ref 13.0–17.0)
MCH: 29.9 pg (ref 26.0–34.0)
MCHC: 34.2 g/dL (ref 30.0–36.0)
MCV: 87.4 fL (ref 78.0–100.0)
PLATELETS: 310 10*3/uL (ref 150–400)
RBC: 3.65 MIL/uL — AB (ref 4.22–5.81)
RDW: 15.4 % (ref 11.5–15.5)
WBC: 9.8 10*3/uL (ref 4.0–10.5)

## 2017-06-14 MED ORDER — APIXABAN 5 MG PO TABS: 5.0000 mg | ORAL_TABLET | Freq: Two times a day (BID) | ORAL | 3 refills | Status: DC

## 2017-06-14 MED ORDER — NITROGLYCERIN 0.4 MG SL SUBL: 0.4000 mg | SUBLINGUAL_TABLET | SUBLINGUAL | Status: DC | PRN

## 2017-06-14 MED ORDER — METOPROLOL TARTRATE 25 MG PO TABS: 12.5000 mg | ORAL_TABLET | Freq: Two times a day (BID) | ORAL | 1 refills | Status: DC

## 2017-06-14 MED ORDER — NITROGLYCERIN 0.4 MG SL SUBL: 0.4000 mg | SUBLINGUAL_TABLET | SUBLINGUAL | 1 refills | Status: DC | PRN

## 2017-06-14 MED ORDER — ANGIOPLASTY BOOK
Freq: Once | Status: AC
  Administered 2017-06-14: 10:00:00
  Filled 2017-06-14: qty 1

## 2017-06-14 MED ORDER — ASPIRIN 81 MG PO CHEW: 81.0000 mg | CHEWABLE_TABLET | Freq: Every day | ORAL | 0 refills | Status: DC

## 2017-06-14 MED ORDER — CLOPIDOGREL BISULFATE 75 MG PO TABS: 75.0000 mg | ORAL_TABLET | Freq: Every day | ORAL | 3 refills | Status: DC

## 2017-06-14 MED FILL — Nitroglycerin IV Soln 100 MCG/ML in D5W: INTRA_ARTERIAL | Qty: 10 | Status: AC

## 2017-06-14 NOTE — Discharge Summary (Signed)
Discharge Summary    Patient ID: Justin [name],  MRN: [id number], DOB/AGE: 1928-11-28 83 y.o.  Admit date: 06/10/2017 Discharge date: 06/14/2017   Primary Care Provider: [name] [name] Primary Cardiologist: Dr. Stanford [name]  Discharge Diagnoses    Principal Problem:   NSTEMI (non-ST elevated myocardial infarction) [facility]) Active Problems:   Hyperlipidemia   Essential hypertension   CAD, ARTERY BYPASS GRAFT   BPH (benign prostatic hyperplasia)   Aortic stenosis   Paroxysmal atrial fibrillation (HCC)   Chest pain   Unstable angina (HCC)   Allergies Allergies  Allergen Reactions  . Toradol [Ketorolac Tromethamine] Other (See Comments)    Dizzy and light headed  . Hydrocodone Other (See Comments)    hallucinations  . Dutasteride Diarrhea    GI upset and incontinence     History of Present Illness     Justin [name] is [name] 83 y.o. male with [name] history of PAF (since 2016 initially not on AC due to gait disturbance and hx of syncope, now on eliquis), moderate AS, moderate MR, CAD s/p CABG 2001, HTN, HLD, and PVD. He was transferred from [facility] for unstable angina.  Justin [name] is known to this service and last saw Dr. Stanford [name] in clinic on 01/12/17. At that time, he was in his usual state of health and was found to be in NSR. Amiodarone and cardizem were both continued. He was also continued on eliquis.   Per ED provider note, he presented to [place] with onset of chest pain. Chest pain started about [name] week ago. His symptoms resolved with an ASA and antacid. Today, he had [name] recurrence of the pain that lasted approximately 30 min and radiated to his left arm.   On my interview, wife is at bedside and helps provide history. He had chest pain at rest on Sunday 06/05/17 that lasted about 30 min and resolved after ASA 325 mg and tums. He did not have [name] recurrence until 0400 this morning. Chest pain woke him from sleep. It lasted about 30 min and eventually  resolved after ASA 325 mg. He tried to sleep again, but again woke up with chest pain. His wife gave another ASA 325 mg and brought him to [place]. He states the chest pain radiated down his arm. His trponins are positive at 1.62 --> 3.41. He received one dose of morphine and nitro paste with resolution of his pain. He was accepted in transfer to [facility] under Cardiology. He is in NSR but states [name] dull chest pain is returning.   He last took home medications, including eliquis, last evening.   Of note, he walks 0.5 miles 2 times weekly outside with his walker. He reports that over the past week, he has felt more tired and weakness in his legs that prevented him from walking his normal distance.  Hospital Course     Consultants: none  Pt was admitted to cardiology with heparin drip for NSTEMI. Given his recent eliquis, unable to cath him on 06/10/17. He went to heart cath on 06/13/17. DES placed proximal SVG to first OM. He recoverd fro the procedure well. He will need to be on ASA and plavix for at least one month.    PAF He remained in NSR throughout this hospitalization. No changes to amio or diltiazem. Because he's been in NSR and also needs ASA and plavix for 1 month, will stop eliquis for 30 days. In 30 days,  will stop ASA and resume Eliquis. Continue plavix throughout.   Chronic systolic heart failure Echo with LVEF of 40-45%. This is unchanged from previous echo n 10/06/16. Medical management. Low dose lopressor started this admission. He is baseline bradycardic in the 50-60s. Has tolerated this well in addition to dilt and amio. Will continue lopressor, transition to toprol OP.  HLD - will continue pravachol. LDL this admission was 70. HDL 76, triglycerides 39.  HTN - pressures have been well-controlled, no medication changes.  AS Was moderate on echo. Will follow as outpatient.  Patient seen and examined by Dr. Stanford [name] today and was stable for discharge. All follow up  has been arranged.  _____________  Discharge Vitals Blood pressure (!) 152/71, pulse 71, temperature 98.3 F (36.8 C), temperature source Oral, resp. rate 16, height 5\' 5"  (1.651 m), weight 138 lb 10.7 oz (62.9 kg), SpO2 95 %.  Filed Weights   06/12/17 0332 06/13/17 0347 06/14/17 0435  Weight: 139 lb 12.8 oz (63.4 kg) 140 lb 8 oz (63.7 kg) 138 lb 10.7 oz (62.9 kg)    Labs & Radiologic Studies    CBC Recent Labs    06/13/17 0325 06/14/17 0703  WBC 9.3 9.8  HGB 10.3* 10.9*  HCT 31.3* 31.9*  MCV 87.7 87.4  PLT 323 875   Basic Metabolic Panel Recent Labs    06/13/17 0325 06/14/17 0703  NA 135 135  K 3.6 3.9  CL 102 104  CO2 25 22  GLUCOSE 104* 99  BUN 22* 15  CREATININE 1.05 1.03  CALCIUM 8.6* 8.6*   Liver Function Tests No results for input(s): AST, ALT, ALKPHOS, BILITOT, PROT, ALBUMIN in the last 72 hours. No results for input(s): LIPASE, AMYLASE in the last 72 hours. Cardiac Enzymes No results for input(s): CKTOTAL, CKMB, CKMBINDEX, TROPONINI in the last 72 hours. BNP Invalid input(s): POCBNP D-Dimer No results for input(s): DDIMER in the last 72 hours. Hemoglobin A1C No results for input(s): HGBA1C in the last 72 hours. Fasting Lipid Panel No results for input(s): CHOL, HDL, LDLCALC, TRIG, CHOLHDL, LDLDIRECT in the last 72 hours. Thyroid Function Tests No results for input(s): TSH, T4TOTAL, T3FREE, THYROIDAB in the last 72 hours.  Invalid input(s): FREET3 _____________  Dg Chest 2 View  Result Date: 06/10/2017 CLINICAL DATA:  Chest pain.  Prior CABG. EXAM: CHEST  2 VIEW COMPARISON:  11/24/2016 . FINDINGS: Mediastinum hilar structures are stable. Stable calcified mediastinal hilar lymph nodes are present. Prior CABG. No cardiomegaly. No pulmonary venous congestion. Mild bibasilar subsegmental atelectasis and/or scarring again noted. No focal alveolar infiltrate. No pleural effusion or pneumothorax. IMPRESSION: 1. Mild bibasilar subsegmental atelectasis  and/or scarring again noted. No acute cardiopulmonary disease. Stable calcified mediastinal hilar lymph nodes again noted. 2.  Prior CABG.  Heart size normal. Electronically Signed   By: Marcello Moores  Register   On: 06/10/2017 09:05   Dg Elbow Complete Right (3+view)  Result Date: 06/13/2017 CLINICAL DATA:  Pain. EXAM: RIGHT ELBOW - COMPLETE 3+ VIEW COMPARISON:  None. FINDINGS: There is no evidence of fracture or dislocation. Suggestion of [name] joint effusion. There is no evidence of arthropathy or other focal bone abnormality. Extensive arterial vascular calcification. IMPRESSION: Probable small elbow joint effusion.  No bone abnormality. Electronically Signed   By: Lorriane Shire M.D.   On: 06/13/2017 08:17   Dg Wrist 2 Views Right  Result Date: 06/13/2017 CLINICAL DATA:  Pain EXAM: RIGHT WRIST - 2 VIEW COMPARISON:  None. FINDINGS: No fracture or dislocation is seen.  Mild narrowing of the radiocarpal joint. Mild degenerative changes the 1st CMC joint. Vascular calcifications. IMPRESSION: Negative. Electronically Signed   By: Julian Hy M.D.   On: Jul 08, 2017 08:14     Diagnostic Studies/Procedures    Heart cath July 08, 2017:  Ost Cx to Prox Cx lesion is 100% stenosed.  Ost LAD to Prox LAD lesion is 100% stenosed.  Mid RCA lesion is 100% stenosed.  Origin to Prox Graft lesion before Ost 2nd Mrg is 100% stenosed.  Origin to Prox Graft lesion is 95% stenosed.  [name] stent was successfully placed.  Post intervention, there is [name] 0% residual stenosis.  IMPRESSION: Successful PCI and drug-eluting stenting to the very proximal SVG to the first high obtuse marginal branch reducing 95% stenosis to 0% residual with [name] 2.5 x 16 mm long synergy drug-eluting stent. The SVG G2 OM 2 and 3 is occluded at the origin and this appears to potentially be the "culprit vessel since the occlusion appears "fresh". The vein to the distal right is patent as is the LIMA to the LAD. He apparently needs to be on Eliquis  which I'm somewhat concerned about. He'll need to be on aspirin and Plavix for 1 month after which the aspirin can be discontinued and Plavix and this can be continued. I would continue Plavix for at least 12 months uninterrupted if he has [name] compelling reason to stop.  Diagnostic Diagram       Post-Intervention Diagram          Echo 06/10/17: ------------------------------------------------------------------- Study Conclusions - Left ventricle: The cavity size was normal. Wall thickness was   increased in [name] pattern of moderate LVH. Basal to mid   anterolateral akinesis. Basal to mid inferolateral akinesis.   Anterior wall appears hypokinetic. Systolic function was mildly   to moderately reduced. The estimated ejection fraction was in the   range of 40% to 45%. Doppler parameters are consistent with   abnormal left ventricular relaxation (grade 1 diastolic   dysfunction). - Aortic valve: Probably trileaflet; severely calcified leaflets.   There was moderate stenosis. Mean gradient (S): 21 mm Hg. Peak   gradient (S): 37 mm Hg. Valve area (VTI): 1.06 cm^2. - Aorta: Mildly dilated aortic root. Aortic root dimension: 38 mm   (ED). - Mitral valve: Mildly calcified annulus. There was mild   regurgitation. - Left atrium: The atrium was mildly dilated. - Right ventricle: The cavity size was normal. Systolic function   was mildly reduced. - Tricuspid valve: Peak RV-RA gradient (S): 18 mm Hg. - Pulmonary arteries: PA peak pressure: 21 mm Hg (S). - Inferior vena cava: The vessel was normal in size. The   respirophasic diameter changes were in the normal range (>= 50%),   consistent with normal central venous pressure.  Impressions: - Normal LV size with moderate LV hypertrophy. EF 40-45% with wall   motion abnormalities as noted above. Normal RV size with mildly   decreased systolic function. Moderate aortic stenosis. Mild   mitral regurgitation.  Disposition   Pt is being  discharged home today in good condition.  Follow-up Plans & Appointments    Follow-up Information    Erlene Quan, PA-C Follow up on 06/24/2017.   Specialties:  Cardiology, Radiology Why:  11:30 am for [facility] Contact information: [address] [place] [address] [phone]          Discharge Instructions    Amb Referral to Cardiac Rehabilitation   Complete by:  As directed  Diagnosis:   PTCA Coronary Stents NSTEMI     Diet - low sodium heart healthy   Complete by:  As directed    Discharge instructions   Complete by:  As directed    No driving for 2 days. No lifting over 5 lbs for 1 week. No sexual activity for 1 week. Keep procedure site clean & dry. If you notice increased pain, swelling, bleeding or pus, call/return!  You may shower, but no soaking baths/hot tubs/pools for 1 week.   Increase activity slowly   Complete by:  As directed       Discharge Medications   Allergies as of 06/14/2017      Reactions   Toradol [ketorolac Tromethamine] Other (See Comments)   Dizzy and light headed   Hydrocodone Other (See Comments)   hallucinations   Dutasteride Diarrhea   GI upset and incontinence      Medication List    TAKE these medications   acetaminophen 500 MG tablet Commonly known as:  TYLENOL Take 500 mg by mouth daily as needed for moderate pain.   amiodarone 200 MG tablet Commonly known as:  PACERONE Take 200 mg by mouth daily.   apixaban 5 MG Tabs tablet Commonly known as:  ELIQUIS Take 1 tablet (5 mg total) by mouth 2 (two) times daily. Stop taking for 30 days. Will resume in 30 days when ASA is discontinued. What changed:  additional instructions   aspirin 81 MG chewable tablet Chew 1 tablet (81 mg total) by mouth daily. Start taking on:  06/15/2017   beta carotene w/minerals tablet Take 1 tablet by mouth daily.   carbidopa-levodopa 25-100 MG tablet Commonly known as:  SINEMET Take 0.5 tab QHS x 1week, then 0.5 tab BID  x1week, then 0.5 tab TID x 1week, then 1 tab TID What changed:    how much to take  how to take this  when to take this  additional instructions   carbidopa-levodopa 25-100 MG tablet Commonly known as:  SINEMET IR Take 1 tablet by mouth 3 (three) times daily. What changed:  You were already taking [name] medication with the same name, and this prescription was added. Make sure you understand how and when to take each.   clopidogrel 75 MG tablet Commonly known as:  PLAVIX Take 1 tablet (75 mg total) by mouth daily with breakfast. Start taking on:  06/15/2017   diltiazem 120 MG 24 hr capsule Commonly known as:  CARTIA XT Take 1 capsule (120 mg total) by mouth 2 (two) times daily.   docusate sodium 100 MG capsule Commonly known as:  COLACE Take 100 mg by mouth every morning.   ferrous sulfate 325 (65 FE) MG tablet Take 325 mg by mouth daily with breakfast.   finasteride 5 MG tablet Commonly known as:  PROSCAR Take 5 mg by mouth every morning.   lovastatin 20 MG tablet Commonly known as:  MEVACOR Take 1 tablet (20 mg total) by mouth daily with breakfast.   meclizine 25 MG tablet Commonly known as:  ANTIVERT Take 1 tablet (25 mg total) by mouth 3 (three) times daily as needed for dizziness.   metoprolol tartrate 25 MG tablet Commonly known as:  LOPRESSOR Take 0.5 tablets (12.5 mg total) by mouth 2 (two) times daily.   multivitamin with minerals Tabs tablet Take 1 tablet by mouth daily.   nitroGLYCERIN 0.4 MG SL tablet Commonly known as:  NITROSTAT Place 1 tablet (0.4 mg total) under the tongue every 5 (five)  minutes as needed for chest pain.   PROBIOTIC DAILY PO Take 1 capsule by mouth daily.   THERATEARS OP Apply 1 drop to eye daily as needed (dry eyes).   traMADol 50 MG tablet Commonly known as:  ULTRAM Take 1 tablet (50 mg total) by mouth every 6 (six) hours as needed for severe pain.        Aspirin prescribed at discharge?  Yes High Intensity Statin  Prescribed? (Lipitor 40-80mg  or Crestor 20-40mg ): No: home pravachol Beta Blocker Prescribed? Yes For EF <40%, was ACEI/ARB Prescribed? No: marginal pressure ADP Receptor Inhibitor Prescribed? (i.e. Plavix etc.-Includes Medically Managed Patients): Yes For EF <40%, Aldosterone Inhibitor Prescribed? No: marginal pressure Was EF assessed during THIS hospitalization? Yes Was Cardiac Rehab II ordered? (Included Medically managed Patients): Yes   Outstanding Labs/Studies   Follow up with [facility] in [place] in 6-8 weeks  Duration of Discharge Encounter   Greater than 30 minutes including physician time.  Signed, Tami Lin Duke PA-C 06/14/2017, 10:01 AM

## 2017-06-14 NOTE — Care Management Important Message (Signed)
Important Message  Patient Details  Name: Justin [name] MRN: [id number] Date of Birth: April 03, 1929   Medicare Important Message Given:  Yes    Niko Penson 06/14/2017, 2:42 PM

## 2017-06-14 NOTE — Telephone Encounter (Signed)
New message    pt verbalized that she is calling for the rn  Pt c/o medication issue:  1. Name of Medication: eliquis 5mg  pt is on pravax , amadalodapine 200mg  1x day  finasteride 1x day    2. How are you currently taking this medication (dosage and times per day)?   3. Are you having a reaction (difficulty breathing--STAT)? no 4. What is your medication issue? He has taken double the medications for each

## 2017-06-14 NOTE — Telephone Encounter (Signed)
Recommendations discussed with patient's wife, who verbalized understanding and thanks.

## 2017-06-14 NOTE — Progress Notes (Signed)
CARDIAC REHAB PHASE I   PRE:  Rate/Rhythm: 61 SR    BP: sitting 139/67    SaO2:   MODE:  Ambulation: 500 ft   POST:  Rate/Rhythm: 78 SR    BP: sitting 181/70     SaO2:   Pt able to stand and walk with RW, fairly steady. However upon return to room, pt stepped away from RW and sat too quickly on bed. Discussed the safety issues with this with pt and wife. He seems to understand better correct mechanics. He declines HHPT but has PT services at his living facility that his wife will look in to. He requested his referral be sent to [facility]. Understand importance of Plavix. Gave walking gl for home. Wife is very receptive and able. [place], ACSM 06/14/2017 9:44 AM

## 2017-06-14 NOTE — Progress Notes (Signed)
Progress Note  Patient Name: Justin [name] Date of Encounter: 06/14/2017  Primary Cardiologist: Dr Stanford Breed  Subjective   No chest pain or dyspnea  Inpatient Medications    Scheduled Meds: . amiodarone  200 mg Oral Daily  . angioplasty book   Does not apply Once  . aspirin  81 mg Oral Daily  . carbidopa-levodopa  1 tablet Oral TID  . clopidogrel  75 mg Oral Q breakfast  . diltiazem  120 mg Oral BID  . finasteride  5 mg Oral q morning - 10a  . metoprolol tartrate  12.5 mg Oral BID  . nitroGLYCERIN  0.5 inch Topical Q6H  . pravastatin  20 mg Oral q1800  . sodium chloride flush  3 mL Intravenous Q12H  . sodium chloride flush  3 mL Intravenous Q12H   Continuous Infusions: . sodium chloride    . sodium chloride     PRN Meds: sodium chloride, sodium chloride, acetaminophen, hydrALAZINE, morphine injection, ondansetron (ZOFRAN) IV, sodium chloride flush, sodium chloride flush   Vital Signs    Vitals:   06/14/17 0200 06/14/17 0400 06/14/17 0435 06/14/17 0725  BP: 132/63 104/67  (!) 152/71  Pulse: (!) 56 (!) 52  71  Resp: 13 14  16   Temp: (!) 97.5 F (36.4 C)   98.3 F (36.8 C)  TempSrc: Oral   Oral  SpO2: 95% 94%  95%  Weight:   138 lb 10.7 oz (62.9 kg)   Height:        Intake/Output Summary (Last 24 hours) at 06/14/2017 0749 Last data filed at 06/14/2017 0700 Gross per 24 hour  Intake 1486.47 ml  Output 1545 ml  Net -58.53 ml   Filed Weights   06/12/17 0332 06/13/17 0347 06/14/17 0435  Weight: 139 lb 12.8 oz (63.4 kg) 140 lb 8 oz (63.7 kg) 138 lb 10.7 oz (62.9 kg)    Telemetry    Sinus rhythm - Personally Reviewed   Physical Exam   GEN: Frail WD Neck: No JVD, supple Cardiac: RRR, 3/6 systolic murmur, no DM Respiratory: Clear to auscultation bilaterally; no wheeze GI: Soft, nontender, non-distended, no masses MS: No edema; Right groin with mild ecchymoses; no bruit Neuro:  Grossly intact; some sundowning last PM per nursing  Labs      Chemistry Recent Labs  Lab 06/10/17 0830 06/11/17 0406 06/13/17 0325  NA 133* 134* 135  K 3.9 3.9 3.6  CL 98* 99* 102  CO2 28 24 25   GLUCOSE 102* 106* 104*  BUN 18 17 22*  CREATININE 1.14 1.04 1.05  CALCIUM 8.9 8.6* 8.6*  GFRNONAA 56* >60 >60  GFRAA >60 >60 >60  ANIONGAP 7 11 8      Hematology Recent Labs  Lab 06/12/17 0808 06/13/17 0325 06/14/17 0703  WBC 10.2 9.3 9.8  RBC 4.22 3.57* 3.65*  HGB 12.4* 10.3* 10.9*  HCT 36.9* 31.3* 31.9*  MCV 87.4 87.7 87.4  MCH 29.4 28.9 29.9  MCHC 33.6 32.9 34.2  RDW 15.4 15.4 15.4  PLT 346 323 310    Cardiac Enzymes Recent Labs  Lab 06/10/17 1203 06/10/17 1539 06/10/17 2249 06/11/17 0406  TROPONINI 3.41* 5.67* 28.48* 23.24*    BNP Recent Labs  Lab 06/10/17 1539  BNP 423.1*     Patient Profile     83 y.o. male admitted with NSTEMI. Echo shows EF 40-45, moderate AS and mild MR  Assessment & Plan    1 NSTEMI-s/p PCI of SVG to OM1. Continue ASA, plavix, statin  and metoprolol.  2 PAF-remains in sinus; continue amiodarone; I will not resume apixaban for now due to risk of bleeding with triple therapy; plan DC ASA after 30 days and continue plavix; resume apixaban at that time.  3 Confusion-continues to sundown per nursing; A and O x 3 today; baseline mild dementia.  4 Hyperlipidemia-continue pravachol.   5 CAD (s/p CABG)-plan as outlined above.  6 HTN-BP controlled.  7 AS-moderate on echo.  DC today with TOC appt APP one week; fu with me in [place] 6-8 weeks  >30 min PA and physician time D2  For questions or updates, please contact [facility] Please consult [email or web address] for contact info under Cardiology/STEMI.      Signed, Kirk Ruths, MD  06/14/2017, 7:49 AM

## 2017-06-14 NOTE — Telephone Encounter (Signed)
Skip PM eliquis dose today ONLY, then resume medication tomorrow as prescribed.  No additional recommendation at this time

## 2017-06-14 NOTE — Telephone Encounter (Signed)
Returned call to wife. She states patient "got confused" and took additional doses of his AM meds after coming in from lunch out today.  Patient took extra doses of the following at approx 1:30pm: Eliquis, Plavix, Amiodarone, Cartia, and Finasteride.  Pt currently napping.  I advised to rest and take it easy today, check his BP once he wakes from nap, call if BP low (<100/60) or HR below 50, or if he is having symptoms of faintness, dizziness, lightheadedness, or fatigue.  Informed her I would check w pharmD for recommendation on medication holds and relay advice back to her later today. Wife verbalized understanding & thanks.

## 2017-06-15 ENCOUNTER — Telehealth: Payer: Self-pay

## 2017-06-15 NOTE — Telephone Encounter (Signed)
06/15/17  TCM Hospital follow Up  Transition Care Management Follow-up Telephone Call  ADMISSION DATE: 06/10/17  DISCHARGE DATE: 06/14/17   How have you been since you were released from the hospital?  Patient is doig better today per wife.   Do you understand why you were in the hospital? Yes per wife   Do you understand the discharge instrcutions? Yes per wife    Items Reviewed:  Medications reviewed:  Reviewed with wife   Allergies reviewed: Yes   Dietary changes reviewed:Low sodium Heart healthy   Referrals reviewed: Appointment scheduled with Dr. Nani Ravens   Functional Questionnaire:   Activities of Daily Living (ADLs):  Needs assistance with all ADL'S at this time  Any patient concerns?  None at this time per wife   Confirmed importance and date/time of follow-up visits scheduled: No   Confirmed with patient if condition begins to worsen call PCP or go to the ER. No    Patient was given the office number and encouragred to call back with questions or concerns. No

## 2017-06-15 NOTE — Telephone Encounter (Signed)
CAlled patient to complete TCM hospital follow Up. Left message for return call.

## 2017-06-16 ENCOUNTER — Telehealth (HOSPITAL_COMMUNITY): Payer: Self-pay

## 2017-06-16 NOTE — Telephone Encounter (Signed)
Patients insurance is active and benefits verified through [facility] - $15.00 co-pay, no deductible, out of pocket amount of $3,400/$3,252.15 has been met, no co-insurance, and no pre-authorization is required. Passport/reference [phone]  Patient will be contacted and scheduled after their follow up appt with the Cardiologist office upon review by the RN Navigator.

## 2017-06-20 DIAGNOSIS — R55 Syncope and collapse: Secondary | ICD-10-CM | POA: Insufficient documentation

## 2017-06-20 DIAGNOSIS — K5732 Diverticulitis of large intestine without perforation or abscess without bleeding: Secondary | ICD-10-CM | POA: Insufficient documentation

## 2017-06-20 DIAGNOSIS — I509 Heart failure, unspecified: Secondary | ICD-10-CM | POA: Insufficient documentation

## 2017-06-20 DIAGNOSIS — T8859XA Other complications of anesthesia, initial encounter: Secondary | ICD-10-CM | POA: Insufficient documentation

## 2017-06-20 DIAGNOSIS — T4145XA Adverse effect of unspecified anesthetic, initial encounter: Secondary | ICD-10-CM | POA: Insufficient documentation

## 2017-06-20 DIAGNOSIS — K648 Other hemorrhoids: Secondary | ICD-10-CM | POA: Insufficient documentation

## 2017-06-20 DIAGNOSIS — I10 Essential (primary) hypertension: Secondary | ICD-10-CM | POA: Insufficient documentation

## 2017-06-20 DIAGNOSIS — C4491 Basal cell carcinoma of skin, unspecified: Secondary | ICD-10-CM | POA: Insufficient documentation

## 2017-06-24 ENCOUNTER — Encounter: Payer: Self-pay | Admitting: Cardiology

## 2017-06-24 ENCOUNTER — Ambulatory Visit: Payer: PPO | Admitting: Cardiology

## 2017-06-24 VITALS — BP 131/75 | HR 62 | Ht 65.0 in | Wt 142.0 lb

## 2017-06-24 DIAGNOSIS — I480 Paroxysmal atrial fibrillation: Secondary | ICD-10-CM | POA: Diagnosis not present

## 2017-06-24 DIAGNOSIS — Z9861 Coronary angioplasty status: Secondary | ICD-10-CM

## 2017-06-24 DIAGNOSIS — F05 Delirium due to known physiological condition: Secondary | ICD-10-CM

## 2017-06-24 DIAGNOSIS — Z7901 Long term (current) use of anticoagulants: Secondary | ICD-10-CM | POA: Diagnosis not present

## 2017-06-24 DIAGNOSIS — I350 Nonrheumatic aortic (valve) stenosis: Secondary | ICD-10-CM

## 2017-06-24 DIAGNOSIS — I255 Ischemic cardiomyopathy: Secondary | ICD-10-CM | POA: Diagnosis not present

## 2017-06-24 DIAGNOSIS — Z951 Presence of aortocoronary bypass graft: Secondary | ICD-10-CM | POA: Diagnosis not present

## 2017-06-24 DIAGNOSIS — E782 Mixed hyperlipidemia: Secondary | ICD-10-CM

## 2017-06-24 DIAGNOSIS — I214 Non-ST elevation (NSTEMI) myocardial infarction: Secondary | ICD-10-CM | POA: Diagnosis not present

## 2017-06-24 DIAGNOSIS — I2510 Atherosclerotic heart disease of native coronary artery without angina pectoris: Secondary | ICD-10-CM | POA: Diagnosis not present

## 2017-06-24 MED ORDER — APIXABAN 5 MG PO TABS: 5.0000 mg | ORAL_TABLET | Freq: Two times a day (BID) | ORAL | 11 refills | Status: DC

## 2017-06-24 NOTE — Progress Notes (Signed)
06/24/2017 Justin [name]   1928-09-01  [id number]  Primary Physician Mosie Lukes, MD Primary Cardiologist: Dr Stanford Breed  HPI:  Pleasant 83 y/o male followed by Dr Stanford Breed with a history of CAD and PAF. The pt had CABG in 2001 and has done well since. He has PAF and has been holding NSR on Amiodarone. He is on Eliquis now but in the past anticoagulation was held secondary to gait problems and fall risk. The pt presented to the ED 06/10/17 with chest pain. He ruled in for a NSTEMI. The pt was admitted and Eliquis held. That night became confused and combative, we feel this was secondary to MSO4. He di have some mild sundowning the couple of days as well. On 06/14/17 he underwent cath and PCI. The SVG to OM1 and the SVG-OM3 were occluded. The LIMA-LAD and SVG-RCA were patent. He had PCI with DES to the SVG-OM1. Echo done this admission showed his EF to be 40-45%, new for him. The pt was discharged 06/14/17. The plan is for ASA and Plavix for 30 days (hold Eliquis), then drop the ASA and resume Eliquis.   The pt is in the office today for follow up. He accompanied by his wife. Since discharge he has done pretty well, he adits to feeling a little weak. He denies any chest pain. His wife notes his confusion has resolved.    Current Outpatient Medications  Medication Sig Dispense Refill  . acetaminophen (TYLENOL) 500 MG tablet Take 500 mg by mouth daily as needed for moderate pain.    [name] amiodarone (PACERONE) 200 MG tablet Take 200 mg by mouth daily.    . beta carotene w/minerals (OCUVITE) tablet Take 1 tablet by mouth daily.    . carbidopa-levodopa (SINEMET IR) 25-100 MG tablet Take 1 tablet by mouth 3 (three) times daily. 90 tablet 3  . Carboxymethylcellulose Sodium (THERATEARS OP) Apply 1 drop to eye daily as needed (dry eyes).    . clopidogrel (PLAVIX) 75 MG tablet Take 1 tablet (75 mg total) by mouth daily with breakfast. 90 tablet 3  . diltiazem (CARTIA XT) 120 MG 24 hr capsule Take 1  capsule (120 mg total) by mouth 2 (two) times daily. 180 capsule 3  . docusate sodium (COLACE) 100 MG capsule Take 100 mg by mouth every morning.     . ferrous sulfate 325 (65 FE) MG tablet Take 325 mg by mouth daily with breakfast.     . finasteride (PROSCAR) 5 MG tablet Take 5 mg by mouth every morning.     . lovastatin (MEVACOR) 20 MG tablet Take 1 tablet (20 mg total) by mouth daily with breakfast. 90 tablet 3  . meclizine (ANTIVERT) 25 MG tablet Take 1 tablet (25 mg total) by mouth 3 (three) times daily as needed for dizziness. 30 tablet 0  . metoprolol tartrate (LOPRESSOR) 25 MG tablet Take 0.5 tablets (12.5 mg total) by mouth 2 (two) times daily. 30 tablet 1  . Multiple Vitamin (MULTIVITAMIN WITH MINERALS) TABS tablet Take 1 tablet by mouth daily.    . nitroGLYCERIN (NITROSTAT) 0.4 MG SL tablet Place 1 tablet (0.4 mg total) under the tongue every 5 (five) minutes as needed for chest pain. 25 tablet 1  . Probiotic Product (PROBIOTIC DAILY PO) Take 1 capsule by mouth daily.    . traMADol (ULTRAM) 50 MG tablet Take 1 tablet (50 mg total) by mouth every 6 (six) hours as needed for severe pain. 30 tablet 0  . apixaban (  ELIQUIS) 5 MG TABS tablet Take 1 tablet (5 mg total) by mouth 2 (two) times daily. 60 tablet 11   No current facility-administered medications for this visit.     Allergies  Allergen Reactions  . Morphine And Related Other (See Comments)    Severe confusion  . Toradol [Ketorolac Tromethamine] Other (See Comments)    Dizzy and light headed  . Hydrocodone Other (See Comments)    hallucinations  . Dutasteride Diarrhea    GI upset and incontinence    Past Medical History:  Diagnosis Date  . Basal cell carcinoma   . BPH (benign prostatic hypertrophy)   . CAD (coronary artery disease) 1991   a. H/o MI w/ PTCA;  b. s/p CABG by Dr Roxan Hockey w/ LIMA-LAD, SVG-OM1-OM3, SVG-D1, SVG-PDA  . CHF (congestive heart failure) ([place])   . Complication of anesthesia    Wife reports  history of confusion after last surgery in 10/2016-lasted a few days  . HTN (hypertension)   . Hyperlipidemia   . Internal hemorrhoids   . Moderate aortic stenosis    a. 08/2014 Echo: EF 55-60%, no rwma, Gr 1 DD, mod AS, mildly dil asc Ao, mild MR, sev dil LA.  [name] PAF (paroxysmal atrial fibrillation) ([place])    a. Dx 08/2014;  b. CHA2DS2VASc = 4 - decision made to withold [place] 2/2 unsteady gait and syncope.  . Sigmoid diverticulitis   . Syncope    a. 08/2014 - ? orthostatic    Social History   Socioeconomic History  . Marital status: Married    Spouse name: Not on file  . Number of children: 2  . Years of education: Not on file  . Highest education level: Not on file  Social Needs  . Financial resource strain: Not on file  . Food insecurity - worry: Not on file  . Food insecurity - inability: Not on file  . Transportation needs - medical: Not on file  . Transportation needs - non-medical: Not on file  Occupational History  . 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 Smoking status: Former Smoker    Packs/day: 0.50    Years: 15.00    Pack years: 7.50    Last attempt to quit: 06/29/1971    Years since quitting: 46.0  . Smokeless tobacco: Never Used  Substance and Sexual Activity  . Alcohol use: Yes    Alcohol/week: 0.6 - 1.2 oz    Types: 1 - 2 Glasses of wine per week    Comment: alternates wine and liquor. 1/2 pint liquor/week  . Drug use: No  . 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 Not on file  Social History Narrative  . Not on file     Family History  Problem Relation Age of Onset  . Coronary artery disease Father   . Hyperlipidemia Father   . Heart attack Father   . Alcohol abuse Father   . Bipolar disorder Mother   . Heart disease Mother   . Alcohol abuse Brother   . Cancer Brother   . Cancer Maternal Grandfather        oral cancer, chew  tobacco  . Cancer Sister   . Glaucoma Sister   . Alcohol abuse Unknown      Review of Systems: General: negative for chills, fever, night sweats or weight changes.  Cardiovascular: negative for chest pain, dyspnea on exertion, edema, orthopnea,  palpitations, paroxysmal nocturnal dyspnea or shortness of breath Dermatological: negative for rash Respiratory: negative for cough or wheezing Urologic: negative for hematuria Abdominal: negative for nausea, vomiting, diarrhea, bright red blood per rectum, melena, or hematemesis Neurologic: negative for visual changes, syncope, or dizziness All other systems reviewed and are otherwise negative except as noted above.    Blood pressure 131/75, pulse 62, height 5\' 5"  (1.651 m), weight 142 lb (64.4 kg).  General appearance: alert, cooperative, appears stated age and no distress Neck: no carotid bruit and no JVD Lungs: clear to auscultation bilaterally Heart: regular rate and rhythm Extremities: ecchymosis of his arms from being restrained. He has a wound on his Lt hand, a skin tear. This did not look infected to me- not hot, swollen, or red.  Skin: cool and dry Neurologic: Grossly normal  EKG NSR, SB, QTc 484  ASSESSMENT AND PLAN:   NSTEMI (non-ST elevated myocardial infarction) ([place]) Pt presented 06/10/17 with a NSTEMI  CAD S/P percutaneous coronary angioplasty NSTEMI Dec 2018- PCI with DES to SVG-OM1. SVG-OM3 occluded, LIMA-LAD and SVG-RCA patent  Aortic stenosis Echo Dec 2018- moderate AS  Paroxysmal atrial fibrillation ([facility]) NSR (SB) on Amiodarone  Anticoagulated Eliquis on hold for 30 days post PCI/DES  Sundowning During admission Dec 2018  Ischemic cardiomyopathy EF 40-45% Dec 2018 echo   PLAN  The pt appears to be doing well post NSTEMI and PCI. In light of his EF of 40-45% post NSTEMI, and his HR of 55 I considered stopping his Diltiazem and adding low dose ACE but he is frail and currently stable and I didn't want to  make any changes at this time. The pt has an appointment with Dr Stanford Breed in 5 weeks and he can review that then. The pt and his wife were instructed to take his last dose of ASA 81 mg on Jan 18th, then resume Eliquis 5 mg BID.   Kerin Ransom PA-C 06/24/2017 11:50 AM

## 2017-06-24 NOTE — Patient Instructions (Signed)
Medication Instructions:  STOP- Aspirin January 18th and START- Eliquis 5 mg twice a day  If you need a refill on your cardiac medications before your next appointment, please call your pharmacy.  Labwork: None Ordered   Testing/Procedures: None Ordered  Special Instructions:  Happy New Years!!  Follow-Up: Your physician wants you to follow-up in: Keep appointment with Dr Stanford Breed February 6th at 9:20 am.    Thank you for choosing [facility] at [facility]!!

## 2017-06-24 NOTE — Assessment & Plan Note (Signed)
Eliquis on hold for 30 days post PCI/DES

## 2017-06-24 NOTE — Assessment & Plan Note (Signed)
NSTEMI Dec 2018- PCI with DES to SVG-OM1. SVG-OM3 occluded, LIMA-LAD and SVG-RCA patent 

## 2017-06-24 NOTE — Assessment & Plan Note (Signed)
Pt presented 06/10/17 with a NSTEMI

## 2017-06-24 NOTE — Assessment & Plan Note (Signed)
During admission Dec 2018

## 2017-06-24 NOTE — Assessment & Plan Note (Signed)
NSR (SB) on Amiodarone

## 2017-06-24 NOTE — Assessment & Plan Note (Signed)
EF 40-45% Dec 2018 echo

## 2017-06-24 NOTE — Assessment & Plan Note (Signed)
Echo Dec 2018- moderate AS 

## 2017-06-27 ENCOUNTER — Ambulatory Visit: Payer: PPO | Admitting: Family Medicine

## 2017-06-27 ENCOUNTER — Encounter: Payer: Self-pay | Admitting: Family Medicine

## 2017-06-27 VITALS — BP 128/72 | HR 64 | Temp 98.0°F | Ht 65.0 in | Wt 144.0 lb

## 2017-06-27 DIAGNOSIS — D649 Anemia, unspecified: Secondary | ICD-10-CM | POA: Diagnosis not present

## 2017-06-27 DIAGNOSIS — I214 Non-ST elevation (NSTEMI) myocardial infarction: Secondary | ICD-10-CM

## 2017-06-27 NOTE — Patient Instructions (Addendum)
We will send you a MyChart message when the labs come back.   Let us know if you need anything.

## 2017-06-27 NOTE — Progress Notes (Signed)
Chief Complaint  Patient presents with  . Hospitalization Follow-up    HPI Justin [name] is a 83 y.o. y.o. male who presents for a transition of care visit.  Pt was discharged from [facility] on [facility]/14/17.  Within 48 business hours of discharge our office contacted him via telephone to coordinate his care and needs.   Admitted for NSTEMI and tx'd by cardiology team. Saw them 3 days ago and had nml EKG. Currently on ASA and Plavix, holding on Eliquis for now. Feels well overall, still weak. He is trying to steadily return to normal activity.   Social History   Socioeconomic History  . Marital status: Married  Occupational History  . 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 Smoking status: Former Smoker    Packs/day: 0.50    Years: 15.00    Pack years: 7.50    Last attempt to quit: 06/29/1971    Years since quitting: 46.0  . Smokeless tobacco: Never Used  Substance and Sexual Activity  . Alcohol use: Yes    Alcohol/week: 0.6 - 1.2 oz    Types: 1 - 2 Glasses of wine per week    Comment: alternates wine and liquor. 1/2 pint liquor/week  . Drug use: No  . 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 Basal cell carcinoma   . BPH (benign prostatic hypertrophy)   . CAD (coronary artery disease) 1991   a. H/o MI w/ PTCA;  b. s/p CABG by Dr Roxan Hockey w/ LIMA-LAD, SVG-OM1-OM3, SVG-D1, SVG-PDA  . CHF (congestive heart failure) ([place])   . Complication of anesthesia    Wife reports history of confusion after last surgery in 10/2016-lasted a few days  . HTN (hypertension)   . Hyperlipidemia   . Internal hemorrhoids   . Moderate aortic stenosis    a. 08/2014 Echo: EF 55-60%, no rwma, Gr 1 DD, mod AS, mildly dil asc Ao, mild MR, sev dil LA.  [name] PAF (paroxysmal atrial fibrillation) ([place])    a. Dx 08/2014;  b. CHA2DS2VASc = 4 - decision made to withold [place] 2/2  unsteady gait and syncope.  . Sigmoid diverticulitis   . Syncope    a. 08/2014 - ? orthostatic   Family History  Problem Relation Age of Onset  . Coronary artery disease Father   . Hyperlipidemia Father   . Heart attack Father   . Alcohol abuse Father   . Bipolar disorder Mother   . Heart disease Mother   . Alcohol abuse Brother   . Cancer Brother   . Cancer Maternal Grandfather        oral cancer, chew tobacco  . Cancer Sister   . Glaucoma Sister   . Alcohol abuse Unknown    Allergies as of 06/27/2017      Reactions   Morphine And Related Other (See Comments)   Severe confusion   Toradol [ketorolac Tromethamine] Other (See Comments)   Dizzy and light headed   Hydrocodone Other (See Comments)   hallucinations   Dutasteride Diarrhea   GI upset and incontinence      Medication List        Accurate as of 06/27/17 10:44 AM. Always use your most recent med list.          acetaminophen 500 MG tablet Commonly known as:  TYLENOL Take 500 mg by mouth daily as needed for moderate pain.  amiodarone 200 MG tablet Commonly known as:  PACERONE Take 200 mg by mouth daily.   apixaban 5 MG Tabs tablet Commonly known as:  ELIQUIS Take 1 tablet (5 mg total) by mouth 2 (two) times daily.   beta carotene w/minerals tablet Take 1 tablet by mouth daily.   carbidopa-levodopa 25-100 MG tablet Commonly known as:  SINEMET IR Take 1 tablet by mouth 3 (three) times daily.   clopidogrel 75 MG tablet Commonly known as:  PLAVIX Take 1 tablet (75 mg total) by mouth daily with breakfast.   diltiazem 120 MG 24 hr capsule Commonly known as:  CARTIA XT Take 1 capsule (120 mg total) by mouth 2 (two) times daily.   docusate sodium 100 MG capsule Commonly known as:  COLACE Take 100 mg by mouth every morning.   ferrous sulfate 325 (65 FE) MG tablet Take 325 mg by mouth daily with breakfast.   finasteride 5 MG tablet Commonly known as:  PROSCAR Take 5 mg by mouth every morning.     lovastatin 20 MG tablet Commonly known as:  MEVACOR Take 1 tablet (20 mg total) by mouth daily with breakfast.   meclizine 25 MG tablet Commonly known as:  ANTIVERT Take 1 tablet (25 mg total) by mouth 3 (three) times daily as needed for dizziness.   metoprolol tartrate 25 MG tablet Commonly known as:  LOPRESSOR Take 0.5 tablets (12.5 mg total) by mouth 2 (two) times daily.   multivitamin with minerals Tabs tablet Take 1 tablet by mouth daily.   nitroGLYCERIN 0.4 MG SL tablet Commonly known as:  NITROSTAT Place 1 tablet (0.4 mg total) under the tongue every 5 (five) minutes as needed for chest pain.   PROBIOTIC DAILY PO Take 1 capsule by mouth daily.   THERATEARS OP Apply 1 drop to eye daily as needed (dry eyes).       ROS:  Constitutional: No fevers or chills, no weight loss HEENT: No headaches, hearing loss, or runny nose, no sore throat Heart: No chest pain Lungs: No SOB, no cough Abd: No bowel changes, no pain, no N/V GU: No urinary complaints Neuro: No numbness, tingling or weakness Msk: No joint or muscle pain   Objective BP 128/72 (BP Location: Left Arm, Patient Position: Sitting, Cuff Size: Normal)   Pulse 64   Temp 98 F (36.7 C) (Oral)   Ht 5\' 5"  (1.651 m)   Wt 144 lb (65.3 kg)   SpO2 93%   BMI 23.96 kg/m  General Appearance:  awake, alert, oriented, in no acute distress and well developed, well nourished Skin:  there are no suspicious lesions or rashes of concern Head/face:  NCAT Eyes:  EOMI, PERRLA Ears:  canals and TMs NI Nose/Sinuses:  negative Mouth/Throat:  Mucosa moist, no lesions; pharynx without erythema, edema or exudate. Neck:  neck- supple, no mass, non-tender and no jvd Lungs: Clear to auscultation.  No rales, rhonchi, or wheezing. Normal effort, no accessory muscle use. Heart:  Heart sounds are normal.  Regular rate and rhythm without murmur, gallop or rub. No bruits. Abdomen:  BS+, soft, NT, ND, no masses or  organomegaly Musculoskeletal:  No muscle group atrophy or asymmetry, 5/5 strength throughout UE's and LE's b/l Neurologic:  Alert and oriented x 3, gait normal., reflexes normal and symmetri Psych exam: Nml mood and affect, age appropriate judgment and insight  NSTEMI (non-ST elevated myocardial infarction) ([place]) - Plan: Basic metabolic panel  Anemia, unspecified type - Plan: CBC  Discharge summary and medication  list have been reviewed/reconciled.  Labs pending at the time of discharge have been reviewed or are still pending at the time of this visit.  Follow-up labs and appointments have been ordered and/or coordinated appropriately. Educational materials regarding the patient's admitting diagnosis provided.  TRANSITIONAL CARE MANAGEMENT CERTIFICATION:  I certify the following are true:   1. Communication with the patient/care giver was made within 2 business days of discharge.  2. Complexity of Medical decision making is moderate.  3. Face to face visit occurred within 14 days of discharge.   F/u prn. The patient voiced understanding and agreement to the plan.  Bernie, DO 06/27/17 10:44 AM

## 2017-06-27 NOTE — Progress Notes (Signed)
Pre visit review using our clinic review tool, if applicable. No additional management support is needed unless otherwise documented below in the visit note. 

## 2017-06-28 LAB — BASIC METABOLIC PANEL
BUN / CREAT RATIO: 16 (calc) (ref 6–22)
BUN: 22 mg/dL (ref 7–25)
CALCIUM: 9.2 mg/dL (ref 8.6–10.3)
CHLORIDE: 102 mmol/L (ref 98–110)
CO2: 29 mmol/L (ref 20–32)
Creat: 1.36 mg/dL — ABNORMAL HIGH (ref 0.70–1.11)
GLUCOSE: 93 mg/dL (ref 65–99)
Potassium: 5.3 mmol/L (ref 3.5–5.3)
Sodium: 139 mmol/L (ref 135–146)

## 2017-06-28 LAB — CBC
HEMATOCRIT: 37.9 % — AB (ref 38.5–50.0)
Hemoglobin: 12.4 g/dL — ABNORMAL LOW (ref 13.2–17.1)
MCH: 29.1 pg (ref 27.0–33.0)
MCHC: 32.7 g/dL (ref 32.0–36.0)
MCV: 89 fL (ref 80.0–100.0)
MPV: 9.5 fL (ref 7.5–12.5)
PLATELETS: 461 10*3/uL — AB (ref 140–400)
RBC: 4.26 10*6/uL (ref 4.20–5.80)
RDW: 13.6 % (ref 11.0–15.0)
WBC: 9.3 10*3/uL (ref 3.8–10.8)

## 2017-06-29 ENCOUNTER — Other Ambulatory Visit: Payer: Self-pay | Admitting: Family Medicine

## 2017-06-29 DIAGNOSIS — D649 Anemia, unspecified: Secondary | ICD-10-CM

## 2017-06-30 ENCOUNTER — Telehealth (HOSPITAL_COMMUNITY): Payer: Self-pay

## 2017-06-30 ENCOUNTER — Other Ambulatory Visit (INDEPENDENT_AMBULATORY_CARE_PROVIDER_SITE_OTHER): Payer: PPO

## 2017-06-30 DIAGNOSIS — D649 Anemia, unspecified: Secondary | ICD-10-CM

## 2017-06-30 LAB — CBC
HCT: 40 % (ref 39.0–52.0)
HEMOGLOBIN: 13 g/dL (ref 13.0–17.0)
MCHC: 32.4 g/dL (ref 30.0–36.0)
MCV: 91.2 fl (ref 78.0–100.0)
PLATELETS: 443 10*3/uL — AB (ref 150.0–400.0)
RBC: 4.38 Mil/uL (ref 4.22–5.81)
RDW: 15.9 % — ABNORMAL HIGH (ref 11.5–15.5)
WBC: 8 10*3/uL (ref 4.0–10.5)

## 2017-06-30 NOTE — Telephone Encounter (Signed)
Called and spoke with patient in regards to Cardiac Rehab - Patient is interested in the program. Spoke with wife. Scheduled orientation on 08/04/2017 at 1:30pm. Patient will attend the 11:15am exc class.

## 2017-07-07 ENCOUNTER — Telehealth: Payer: Self-pay | Admitting: Cardiology

## 2017-07-07 NOTE — Telephone Encounter (Signed)
She thinks pt needs to be seen,been short of breath this week.This happen when the pt is not exerting himself.

## 2017-07-07 NOTE — Telephone Encounter (Signed)
Returned call to patient.He stated for the past 1 week he has been sob with exertion.Stated he noticed today sob seemed worse.No chest pain.No edema.Weight stable.He feels weak requesting appointment.Appointment scheduled with [name] PA 07/11/17 at 2:00 pm.Advised to go to ED if needed.

## 2017-07-11 ENCOUNTER — Ambulatory Visit: Payer: PPO | Admitting: Physician Assistant

## 2017-07-11 ENCOUNTER — Encounter: Payer: Self-pay | Admitting: Physician Assistant

## 2017-07-11 VITALS — BP 132/82 | HR 51 | Ht 65.0 in | Wt 143.0 lb

## 2017-07-11 DIAGNOSIS — Z7901 Long term (current) use of anticoagulants: Secondary | ICD-10-CM | POA: Diagnosis not present

## 2017-07-11 DIAGNOSIS — I10 Essential (primary) hypertension: Secondary | ICD-10-CM | POA: Diagnosis not present

## 2017-07-11 DIAGNOSIS — R3912 Poor urinary stream: Secondary | ICD-10-CM | POA: Diagnosis not present

## 2017-07-11 DIAGNOSIS — I214 Non-ST elevation (NSTEMI) myocardial infarction: Secondary | ICD-10-CM

## 2017-07-11 DIAGNOSIS — R001 Bradycardia, unspecified: Secondary | ICD-10-CM | POA: Diagnosis not present

## 2017-07-11 DIAGNOSIS — N401 Enlarged prostate with lower urinary tract symptoms: Secondary | ICD-10-CM | POA: Diagnosis not present

## 2017-07-11 DIAGNOSIS — I480 Paroxysmal atrial fibrillation: Secondary | ICD-10-CM

## 2017-07-11 MED ORDER — METOPROLOL TARTRATE 25 MG PO TABS: 25.0000 mg | ORAL_TABLET | Freq: Two times a day (BID) | ORAL | 6 refills | Status: DC

## 2017-07-11 NOTE — Progress Notes (Signed)
Cardiology Office Note   Date:  07/11/2017   ID:  [name], DOB 1928-12-15, MRN [id number]  PCP:  Mosie Lukes, MD  Cardiologist: Dr. Stanford Breed, 06/14/2017 in hospital [facility], PA-C, 06/24/2017 [name], PA-C 07/22/2016  Chief Complaint  Patient presents with  . Follow-up    here for shortness of breath X 3 wks, has questions about Eliquis/Plavix, CATH questions    History of Present Illness: Justin [name] is a 84 y.o. male with a history of  PAF on Eliquis, CABG x 5 2000 w/ LIMA-LAD, SVG-OM1-OM3, SVG-D1, SVG-PDA. Hx HTN, HLD, BPH, mod AS, EF 40-45% echo 05/2017  Admitted 12/14- 06/14/2017 with non-STEMI, s/p DES to SVG-OM1, SVG-OM 3 occluded.  He will be on aspirin and Plavix for 1 month with no Eliquis.  In 30 days, resume Eliquis and stop aspirin.  Continue Plavix.  On Lopressor, transition to Toprol-XL as an outpatient 12/28 office visit, patient advised to stop aspirin after January 18 and resume Eliquis.  Because of his decreased EF and bradycardia with a heart rate of 55, he may need to stop his diltiazem and add an ACE but he was weak and frail and no changes were made at the time.  [name] presents for cardiology follow up.  His legs get tired very quickly.  He tries but cannot walk very far without stopping.  He gets tired changing his clothes and doing other ADLs and has to rest.  He has been trying to walk more. He can do about 15-20 minutes at a time, no hills. He is very tired afterwards and wonders if he is doing too much. He is having SOB with this. No chest pain. His NSTEMI Sx was CP.   He has had some orthostatic dizziness, does not drink that much water. Has nocturia, limits water to get up only once.   No LE edema upon waking, no orthopnea or PND. He has had some LE edema, daytime, it resolves.   He is compliant with his medications.  He is not having palpitations, some orthostatic dizziness, but no presyncope or syncope.  He does  not think he has had any additional atrial fibrillation.   Past Medical History:  Diagnosis Date  . Basal cell carcinoma   . BPH (benign prostatic hypertrophy)   . CAD (coronary artery disease) 1991   a. H/o MI w/ PTCA;  b. s/p CABG by Dr Roxan Hockey w/ LIMA-LAD, SVG-OM1-OM3, SVG-D1, SVG-PDA  . CHF (congestive heart failure) ([place])   . Complication of anesthesia    Wife reports history of confusion after last surgery in 10/2016-lasted a few days  . HTN (hypertension)   . Hyperlipidemia   . Internal hemorrhoids   . Moderate aortic stenosis    a. 08/2014 Echo: EF 55-60%, no rwma, Gr 1 DD, mod AS, mildly dil asc Ao, mild MR, sev dil LA.  . NSTEMI (non-ST elevated myocardial infarction) ([place]) 05/2017    PCI with DES to SVG-OM1. SVG-OM3 occluded, LIMA-LAD and SVG-RCA patent  . PAF (paroxysmal atrial fibrillation) ([place])    a. Dx 08/2014;  b. CHA2DS2VASc = 4 - decision made to withold [place] 2/2 unsteady gait and syncope.  . Sigmoid diverticulitis   . Syncope    a. 08/2014 - ? orthostatic    Past Surgical History:  Procedure Laterality Date  . COLONOSCOPY    . CORONARY ARTERY BYPASS GRAFT  2000   Dr Roxan Hockey  LIMA-LAD, SVG-OM1-OM3, SVG-D1, SVG-PDA.   [name]  CORONARY STENT INTERVENTION N/A 06/13/2017   Procedure: CORONARY STENT INTERVENTION;  Surgeon: Lorretta Harp, MD;  Location: [place] [facility];  Service: Cardiovascular;  Laterality: N/A;  . CYSTOSCOPY N/A 12/07/2016   Procedure: CYSTOSCOPY;  Surgeon: Cleon Gustin, MD;  Location: [facility];  Service: Urology;  Laterality: N/A;  NEEDS 30 MIN TOTAL FOR SURGERY  . CYSTOSCOPY WITH URETHRAL DILATATION N/A 02/21/2017   Procedure: DILATATION OF BLADDER NECK WITH INJECTION OF MITOMYCIN C;  Surgeon: Cleon Gustin, MD;  Location: [facility];  Service: Urology;  Laterality: N/A;  . EYE SURGERY     bil cataract  . HIP ARTHROPLASTY Left 10/14/2015   Procedure: LEFT HIP HEMI ARTHROPLASTY;  Surgeon: [name], MD;  Location: [place];  Service:  Orthopedics;  Laterality: Left;  . LEFT HEART CATH AND CORS/GRAFTS ANGIOGRAPHY N/A 06/13/2017   Procedure: LEFT HEART CATH AND CORS/GRAFTS ANGIOGRAPHY;  Surgeon: Lorretta Harp, MD;  Location: [place] [facility];  Service: Cardiovascular;  Laterality: N/A;  . SHOULDER SURGERY     x2  . TONSILLECTOMY    . TRANSTHORACIC ECHOCARDIOGRAM  10-06-2016   dr Stanford Breed   mild LVH, ef 40-45%, diffuse hypokinesis/ moderate AV calcification leaflets and annulus,  severe thickened leaflets, mod.-sev. aortic stenosis (Valva area 0.61cm^2, mean grandient 64mmHg, peak gradiant 49mmHg)/  moderate thickened , mild calcified MV leaflets without stenosis, moderate MR/  severe LAE/ trivial PR and TR  . TRANSURETHRAL RESECTION OF BLADDER NECK N/A 12/07/2016   Procedure: TRANSURETHRAL RESECTION OF BLADDER NECK;  Surgeon: Cleon Gustin, MD;  Location: [facility];  Service: Urology;  Laterality: N/A;  . XI ROBOTIC ASSISTED SIMPLE PROSTATECTOMY N/A 11/04/2016   Procedure: XI ROBOTIC ASSISTED SIMPLE PROSTATECTOMY;  Surgeon: Cleon Gustin, MD;  Location: [facility];  Service: Urology;  Laterality: N/A;    Current Outpatient Medications  Medication Sig Dispense Refill  . acetaminophen (TYLENOL) 500 MG tablet Take 500 mg by mouth daily as needed for moderate pain.    [name] amiodarone (PACERONE) 200 MG tablet Take 200 mg by mouth daily.    [name] apixaban (ELIQUIS) 5 MG TABS tablet Take 1 tablet (5 mg total) by mouth 2 (two) times daily. 60 tablet 11  . beta carotene w/minerals (OCUVITE) tablet Take 1 tablet by mouth daily.    . carbidopa-levodopa (SINEMET IR) 25-100 MG tablet Take 1 tablet by mouth 3 (three) times daily. 90 tablet 3  . Carboxymethylcellulose Sodium (THERATEARS OP) Apply 1 drop to eye daily as needed (dry eyes).    . clopidogrel (PLAVIX) 75 MG tablet Take 1 tablet (75 mg total) by mouth daily with breakfast. 90 tablet 3  . diltiazem (CARTIA XT) 120 MG 24 hr capsule Take 1 capsule (120 mg total) by mouth 2 (two)  times daily. 180 capsule 3  . docusate sodium (COLACE) 100 MG capsule Take 100 mg by mouth every morning.     . ferrous sulfate 325 (65 FE) MG tablet Take 325 mg by mouth daily with breakfast.     . finasteride (PROSCAR) 5 MG tablet Take 5 mg by mouth every morning.     . lovastatin (MEVACOR) 20 MG tablet Take 1 tablet (20 mg total) by mouth daily with breakfast. 90 tablet 3  . meclizine (ANTIVERT) 25 MG tablet Take 1 tablet (25 mg total) by mouth 3 (three) times daily as needed for dizziness. 30 tablet 0  . metoprolol tartrate (LOPRESSOR) 25 MG tablet Take 0.5 tablets (12.5 mg total) by mouth 2 (  two) times daily. 30 tablet 1  . Multiple Vitamin (MULTIVITAMIN WITH MINERALS) TABS tablet Take 1 tablet by mouth daily.    . nitroGLYCERIN (NITROSTAT) 0.4 MG SL tablet Place 1 tablet (0.4 mg total) under the tongue every 5 (five) minutes as needed for chest pain. 25 tablet 1  . Probiotic Product (PROBIOTIC DAILY PO) Take 1 capsule by mouth daily.     No current facility-administered medications for this visit.     Allergies:   Morphine and related; Toradol [ketorolac tromethamine]; Hydrocodone; and Dutasteride    Social History:  The patient  reports that he quit smoking about 46 years ago. He has a 7.50 pack-year smoking history. he has never used smokeless tobacco. He reports that he drinks about 0.6 - 1.2 oz of alcohol per week. He reports that he does not use drugs.   Family History:  The patient's family history includes Alcohol abuse in his brother, father, and unknown relative; Bipolar disorder in his mother; Cancer in his brother, maternal grandfather, and sister; Coronary artery disease in his father; Glaucoma in his sister; Heart attack in his father; Heart disease in his mother; Hyperlipidemia in his father.    ROS:  Please see the history of present illness. All other systems are reviewed and negative.    PHYSICAL EXAM: VS:  BP 132/82   Pulse (!) 51   Ht 5\' 5"  (1.651 m)   Wt 143 lb  (64.9 kg)   BMI 23.80 kg/m  , BMI Body mass index is 23.8 kg/m. GEN: Well nourished, well developed, male in no acute distress  HEENT: normal for age  Neck: no JVD, no carotid bruit, no masses Cardiac: RRR; 2-3/murmur, no rubs, or gallops Respiratory:  clear to auscultation bilaterally, normal work of breathing GI: soft, nontender, nondistended, + BS MS: no deformity or atrophy; no edema; distal pulses are 2+ in all 4 extremities   Skin: warm and dry, no rash Neuro:  Strength and sensation are intact Psych: euthymic mood, full affect   EKG:  EKG is ordered today. The ekg ordered today demonstrates sinus bradycardia, heart rate 51 with first-degree AV block and PR 210 ms, left anterior fascicular block (unchanged), lateral T wave inversions seen in V5 and 6 on 12/28 ECG but now deeper and also in V4  CARDIAC CATH 06/13/2017  Ost Cx to Prox Cx lesion is 100% stenosed.  Ost LAD to Prox LAD lesion is 100% stenosed.  Mid RCA lesion is 100% stenosed.  Origin to Prox Graft lesion before Ost 2nd Mrg is 100% stenosed.  Origin to Prox Graft lesion is 95% stenosed.  A stent was successfully placed.  Post intervention, there is a 0% residual stenosis. Post-Intervention Diagram       ECHO: 06/10/2017 - Left ventricle: The cavity size was normal. Wall thickness was   increased in a pattern of moderate LVH. Basal to mid   anterolateral akinesis. Basal to mid inferolateral akinesis.   Anterior wall appears hypokinetic. Systolic function was mildly   to moderately reduced. The estimated ejection fraction was in the   range of 40% to 45%. Doppler parameters are consistent with   abnormal left ventricular relaxation (grade 1 diastolic   dysfunction). - Aortic valve: Probably trileaflet; severely calcified leaflets.   There was moderate stenosis. Mean gradient (S): 21 mm Hg. Peak   gradient (S): 37 mm Hg. Valve area (VTI): 1.06 cm^2. - Aorta: Mildly dilated aortic root. Aortic root  dimension: 38 mm (ED). - Mitral valve:  Mildly calcified annulus. There was mild regurgitation. - Left atrium: The atrium was mildly dilated. - Right ventricle: The cavity size was normal. Systolic function   was mildly reduced. - Tricuspid valve: Peak RV-RA gradient (S): 18 mm Hg. - Pulmonary arteries: PA peak pressure: 21 mm Hg (S). - Inferior vena cava: The vessel was normal in size. The   respirophasic diameter changes were in the normal range (>= 50%),   consistent with normal central venous pressure. Impressions: - Normal LV size with moderate LV hypertrophy. EF 40-45% with wall   motion abnormalities as noted above. Normal RV size with mildly   decreased systolic function. Moderate aortic stenosis. Mild   mitral regurgitation.  Recent Labs: 11/24/2016: TSH 7.432 11/26/2016: Magnesium 1.8 12/23/2016: ALT 19 06/10/2017: B Natriuretic Peptide 423.1 06/27/2017: BUN 22; Creat 1.36; Potassium 5.3; Sodium 139 06/30/2017: Hemoglobin 13.0; Platelets 443.0    Lipid Panel    Component Value Date/Time   CHOL 154 06/11/2017 0406   TRIG 39 06/11/2017 0406   HDL 76 06/11/2017 0406   CHOLHDL 2.0 06/11/2017 0406   VLDL 8 06/11/2017 0406   LDLCALC 70 06/11/2017 0406     Wt Readings from Last 3 Encounters:  07/11/17 143 lb (64.9 kg)  06/27/17 144 lb (65.3 kg)  06/24/17 142 lb (64.4 kg)     Other studies Reviewed: Additional studies/ records that were reviewed today include: Office notes, hospital records and testing.  ASSESSMENT AND PLAN:  1.  CAD: He and his wife are aware of the timetable to stop the aspirin, but will continue the Plavix.  Continue metoprolol but increase the dose because he will be coming off the diltiazem.  Continue statin.  His last LDL was 70.  2.  Sinus bradycardia: He has been having problems with weakness and early fatigue, has not been able to increase his activity.  He ambulated in the office and got short of breath after about 100 feet.  His heart rate at  that time was 58.  He continued walking but was not able to get his heart rate up to 60.  I explained the concept of chronotropic incompetence to he and his wife.    I will stop the diltiazem and see if he tolerates an increase of the metoprolol off of it.  He is to hold the metoprolol tonight, and tomorrow start with a whole tablet of metoprolol 25 mg twice daily.  Discontinue the diltiazem.  3.  PAF: He has not had any palpitations and does not think he has had any more atrial fibrillation.  Continue amiodarone and beta-blocker as heart rate will tolerate.  Discuss with Dr. Stanford Breed if we should decrease the amiodarone to 100 mg daily.  4.  Chronic anticoagulation: He is not having any bleeding issues on the aspirin and Plavix, he is to let us know if he has any bleeding issues on the Plavix and Eliquis (which he will restart later this week).  5.  Hypertension: His blood pressure is minimally above target today.  Follow this with the medication changes listed below and see if additional management as needed.   Current medicines are reviewed at length with the patient today.  The patient has concerns regarding medicines.  The following changes have been made: Stop diltiazem, increase metoprolol  Labs/ tests ordered today include:  No orders of the defined types were placed in this encounter.    Disposition:   FU with Dr. Stanford Breed  Signed, [name], PA-C  07/11/2017 2:46  PM    [facility] Phone: [phone]; Fax: [phone]  This note was written with the assistance of speech recognition software. Please excuse any transcriptional errors.

## 2017-07-11 NOTE — Patient Instructions (Addendum)
MEDICATION CHANGES  STOP TAKING DILTIAZEM  DO NOT TAKE METOPROLOL TONIGHT. AND THEN INCREASE DOSE TOMORROW TAKING METOPROLOL TARTRATE TO 25 MG TWICE A DAY NEW PRESCRIPTION SENT TO PHARMACY.   NO LABS OR FURTHER TEST TODAY.  KEEP A MONITOR OF HEART RATE AND BLOOD PRESSURE  AFTER EXERCISING AND WHEN YOU BAD , LET us KNOW.    Your physician recommends that you KEEP schedule  follow-up appointment WITH DR CRENSHAW.    If you need a refill on your cardiac medications before your next appointment, please call your pharmacy.

## 2017-07-18 ENCOUNTER — Telehealth: Payer: Self-pay | Admitting: Physician Assistant

## 2017-07-18 NOTE — Telephone Encounter (Signed)
Returned the call to the patient's wife, per the dpr. Recently at his last visit on 1/14, his Diltiazem was discontinued and Metoprolol was increased to 25 mg bid. His heart rate has remained in the 50's since then and today it was 49. Blood pressure was 170/72. The patient is asymptomatic with feelings of weakness.   Per [name], PA, the patient should discontinue the Metoprolol. An appointment has been made for tomorrow at 9 am. He has been advised to rest today, stay hydrated and if he gets worse he should go to the ED. The wife has verbalized her understanding.

## 2017-07-18 NOTE — Progress Notes (Signed)
Cardiology Office Note   Date:  07/19/2017   ID:  Justin [name], DOB 11-15-1928, MRN [id number]  PCP:  Mosie Lukes, MD  Cardiologist:  Dr. Stanford Breed   Chief Complaint  Patient presents with  . Follow-up  . Bradycardia  . Headache  . Shortness of Breath    History of Present Illness: Justin [name] is a 84 y.o. male with a history of CABG x5 in 2000 w/ LIMA-LAD, SVG-OM1-OM3, SVG-D1, SVG-PDA. He also has a history of PAF on Eliquis, HTN, HLD, moderate aortic stenosis, EF of 40-45% in 2018, and BPH.  He was admitted 12/14-12/18/2018 with non-STEMI, s/p DES to SVG-OM1, SVG-OM 3 occluded. Aspirin and Plavix for 1 month with no Eliquis. After 30 days, he is to resume Eliquis, stop Aspirin, and continue Plavix.  He was on Lopressor in the hospital, and the plan was to transition him to Toprol-XL as an outpatient  07/11/2017 office visit, he was reminded to stop Aspirin after January 18 and resume Eliquis. Because of his decreased EF and bradycardia with a heart rate in the 50s, Diltiazem was stopped and Metoprolol was increased to 25mg .  The combination of the calcium channel blocker and beta-blocker caused chronotropic incompetence  01/21, Phone notes regarding pt feeling weak and low HR.>>metoprolol stopped.   Justin [name] presents today for follow up.   Pt feels more normal today. He has not had metoprolol in >24 hours. Per home BP records, HR has been dropping steadily, high 50s>>low 50s and yesterday in the 40s.   Today, BP at home was 156/88 w/ HR 61, HR got up to 71 w/ activity.   He feels much better, wants to get back to walking. No CP. He is getting SOB w/ exertion, no orthopnea or PND. No LE edema. Plans to keep cardiac rehab appt.   He was on lisinopril in 2016, but had syncope, was diagnosed with afib and was changed to Diltiazem.   Systolic blood pressure has been running in the 130s to the 150s and 160s.  Because of the low heart rate, he has been weak and has  not been doing very much, but is feeling better now.  He has also not been eating very much but feels that he will be able to increase his intake.  He ambulated in the office, and got a little short of breath with ambulation.  His heart rate increased up to 71.  Standing still after ambulation, his oxygen saturation was 88%.  However, he took a few deep breaths and got up to 100%.   Past Medical History:  Diagnosis Date  . Basal cell carcinoma   . BPH (benign prostatic hypertrophy)   . CAD (coronary artery disease) 1991   a. H/o MI w/ PTCA;  b. s/p CABG by Dr Roxan Hockey w/ LIMA-LAD, SVG-OM1-OM3, SVG-D1, SVG-PDA  . CHF (congestive heart failure) ([place])   . Complication of anesthesia    Wife reports history of confusion after last surgery in 10/2016-lasted a few days  . HTN (hypertension)   . Hyperlipidemia   . Internal hemorrhoids   . Moderate aortic stenosis    a. 08/2014 Echo: EF 55-60%, no rwma, Gr 1 DD, mod AS, mildly dil asc Ao, mild MR, sev dil LA.  . NSTEMI (non-ST elevated myocardial infarction) ([place]) 05/2017    PCI with DES to SVG-OM1. SVG-OM3 occluded, LIMA-LAD and SVG-RCA patent  . PAF (paroxysmal atrial fibrillation) ([place])    a. Dx 08/2014;  b.  CHA2DS2VASc = 4 - decision made to withold [place] 2/2 unsteady gait and syncope.  . Sigmoid diverticulitis   . Syncope    a. 08/2014 - ? orthostatic    Past Surgical History:  Procedure Laterality Date  . COLONOSCOPY    . CORONARY ARTERY BYPASS GRAFT  2000   Dr Roxan Hockey  LIMA-LAD, SVG-OM1-OM3, SVG-D1, SVG-PDA.   [name] CORONARY STENT INTERVENTION N/A 06/13/2017   Procedure: CORONARY STENT INTERVENTION;  Surgeon: Lorretta Harp, MD;  Location: [facility];  Service: Cardiovascular;  Laterality: N/A;  . CYSTOSCOPY N/A 12/07/2016   Procedure: CYSTOSCOPY;  Surgeon: Cleon Gustin, MD;  [facility];  Service: Urology;  Laterality: N/A;  NEEDS 30 MIN TOTAL FOR SURGERY  . CYSTOSCOPY WITH URETHRAL DILATATION N/A 02/21/2017    Procedure: DILATATION OF BLADDER NECK WITH INJECTION OF MITOMYCIN C;  Surgeon: Cleon Gustin, MD;  [facility];  Service: Urology;  Laterality: N/A;  . EYE SURGERY     bil cataract  . HIP ARTHROPLASTY Left 10/14/2015   Procedure: LEFT HIP HEMI ARTHROPLASTY;  Surgeon: [name], MD;  Location: [facility];  Service: Orthopedics;  Laterality: Left;  . LEFT HEART CATH AND CORS/GRAFTS ANGIOGRAPHY N/A 06/13/2017   Procedure: LEFT HEART CATH AND CORS/GRAFTS ANGIOGRAPHY;  Surgeon: Lorretta Harp, MD;  Location: [facility];  Service: Cardiovascular;  Laterality: N/A;  . SHOULDER SURGERY     x2  . TONSILLECTOMY    . TRANSTHORACIC ECHOCARDIOGRAM  10-06-2016   dr Stanford Breed   mild LVH, ef 40-45%, diffuse hypokinesis/ moderate AV calcification leaflets and annulus,  severe thickened leaflets, mod.-sev. aortic stenosis (Valva area 0.61cm^2, mean grandient 28mmHg, peak gradiant 62mmHg)/  moderate thickened , mild calcified MV leaflets without stenosis, moderate MR/  severe LAE/ trivial PR and TR  . TRANSURETHRAL RESECTION OF BLADDER NECK N/A 12/07/2016   Procedure: TRANSURETHRAL RESECTION OF BLADDER NECK;  Surgeon: Cleon Gustin, MD;  [facility];  Service: Urology;  Laterality: N/A;  . XI ROBOTIC ASSISTED SIMPLE PROSTATECTOMY N/A 11/04/2016   Procedure: XI ROBOTIC ASSISTED SIMPLE PROSTATECTOMY;  Surgeon: Cleon Gustin, MD;  [facility];  Service: Urology;  Laterality: N/A;    Current Outpatient Medications  Medication Sig Dispense Refill  . acetaminophen (TYLENOL) 500 MG tablet Take 500 mg by mouth daily as needed for moderate pain.    [name] amiodarone (PACERONE) 200 MG tablet Take 200 mg by mouth daily.    [name] apixaban (ELIQUIS) 5 MG TABS tablet Take 1 tablet (5 mg total) by mouth 2 (two) times daily. 60 tablet 11  . beta carotene w/minerals (OCUVITE) tablet Take 1 tablet by mouth daily.    . carbidopa-levodopa (SINEMET IR) 25-100 MG tablet Take 1 tablet by mouth 3 (three)  times daily. 90 tablet 3  . Carboxymethylcellulose Sodium (THERATEARS OP) Apply 1 drop to eye daily as needed (dry eyes).    . clopidogrel (PLAVIX) 75 MG tablet Take 1 tablet (75 mg total) by mouth daily with breakfast. 90 tablet 3  . docusate sodium (COLACE) 100 MG capsule Take 100 mg by mouth every morning.     . ferrous sulfate 325 (65 FE) MG tablet Take 325 mg by mouth daily with breakfast.     . finasteride (PROSCAR) 5 MG tablet Take 5 mg by mouth every morning.     . lovastatin (MEVACOR) 20 MG tablet Take 1 tablet (20 mg total) by mouth daily with breakfast. 90 tablet 3  . meclizine (  ANTIVERT) 25 MG tablet Take 1 tablet (25 mg total) by mouth 3 (three) times daily as needed for dizziness. 30 tablet 0  . Multiple Vitamin (MULTIVITAMIN WITH MINERALS) TABS tablet Take 1 tablet by mouth daily.    . nitroGLYCERIN (NITROSTAT) 0.4 MG SL tablet Place 1 tablet (0.4 mg total) under the tongue every 5 (five) minutes as needed for chest pain. 25 tablet 1  . Probiotic Product (PROBIOTIC DAILY PO) Take 1 capsule by mouth daily.     No current facility-administered medications for this visit.     Allergies:   Morphine and related; Diltiazem hcl; Metoprolol; Toradol [ketorolac tromethamine]; Hydrocodone; and Dutasteride    Social History:  The patient  reports that he quit smoking about 46 years ago. He has a 7.50 pack-year smoking history. he has never used smokeless tobacco. He reports that he drinks about 0.6 - 1.2 oz of alcohol per week. He reports that he does not use drugs.   Family History:  The patient's family history includes Alcohol abuse in his brother, father, and unknown relative; Bipolar disorder in his mother; Cancer in his brother, maternal grandfather, and sister; Coronary artery disease in his father; Glaucoma in his sister; Heart attack in his father; Heart disease in his mother; Hyperlipidemia in his father.    ROS:  Please see the history of present illness. All other systems are  reviewed and negative.    PHYSICAL EXAM: VS:  BP 138/78   Pulse 64   Ht 5\' 5"  (1.651 m)   Wt 146 lb (66.2 kg)   BMI 24.30 kg/m  , BMI Body mass index is 24.3 kg/m. GEN: Well nourished, well developed, male in no acute distress  HEENT: normal for age  Neck: no JVD, no carotid bruit, no masses Cardiac: RRR; 2-3/6 murmur, no rubs, or gallops Respiratory:  clear to auscultation bilaterally, normal work of breathing GI: soft, nontender, nondistended, + BS MS: no deformity or atrophy; no edema; distal pulses are 2+ in all 4 extremities   Skin: warm and dry, no rash Neuro:  Strength and sensation are intact Psych: euthymic mood, full affect   EKG:  EKG is ordered today. ECG is sinus rhythm, heart rate 64, normal intervals, no acute ischemic changes, T wave inversions in V5 and 6 only  EKG 1/142019 demonstrated sinus bradycardia, heart rate 51 with first-degree AV block and PR 210 ms, left anterior fascicular block (unchanged), lateral T wave inversions seen in V5 and 6 on 12/28 ECG but now deeper and also in V4.  CARDIAC CATH 06/13/2017: CARDIAC CATH 06/13/2017  Ost Cx to Prox Cx lesion is 100% stenosed.  Ost LAD to Prox LAD lesion is 100% stenosed.  Mid RCA lesion is 100% stenosed.  Origin to Prox Graft lesion before Ost 2nd Mrg is 100% stenosed.  Origin to Prox Graft lesion is 95% stenosed.  A stent was successfully placed.  Post intervention, there is a 0% residual stenosis. Post-Intervention Diagram       ECHO 06/10/2017: Study Conclusions: - Left ventricle: The cavity size was normal. Wall thickness was   increased in a pattern of moderate LVH. Basal to mid   anterolateral akinesis. Basal to mid inferolateral akinesis.   Anterior wall appears hypokinetic. Systolic function was mildly   to moderately reduced. The estimated ejection fraction was in the   range of 40% to 45%. Doppler parameters are consistent with   abnormal left ventricular relaxation (grade 1  diastolic   dysfunction). - Aortic valve:  Probably trileaflet; severely calcified leaflets.   There was moderate stenosis. Mean gradient (S): 21 mm Hg. Peak   gradient (S): 37 mm Hg. Valve area (VTI): 1.06 cm^2. - Aorta: Mildly dilated aortic root. Aortic root dimension: 38 mm   (ED). - Mitral valve: Mildly calcified annulus. There was mild   regurgitation. - Left atrium: The atrium was mildly dilated. - Right ventricle: The cavity size was normal. Systolic function   was mildly reduced. - Tricuspid valve: Peak RV-RA gradient (S): 18 mm Hg. - Pulmonary arteries: PA peak pressure: 21 mm Hg (S). - Inferior vena cava: The vessel was normal in size. The   respirophasic diameter changes were in the normal range (>= 50%),   consistent with normal central venous pressure. Impressions: - Normal LV size with moderate LV hypertrophy. EF 40-45% with wall   motion abnormalities as noted above. Normal RV size with mildly   decreased systolic function. Moderate aortic stenosis. Mild   mitral regurgitation.   Recent Labs: 11/24/2016: TSH 7.432 11/26/2016: Magnesium 1.8 12/23/2016: ALT 19 06/10/2017: B Natriuretic Peptide 423.1 06/27/2017: BUN 22; Creat 1.36; Potassium 5.3; Sodium 139 06/30/2017: Hemoglobin 13.0; Platelets 443.0    Lipid Panel    Component Value Date/Time   CHOL 154 06/11/2017 0406   TRIG 39 06/11/2017 0406   HDL 76 06/11/2017 0406   CHOLHDL 2.0 06/11/2017 0406   VLDL 8 06/11/2017 0406   LDLCALC 70 06/11/2017 0406     Wt Readings from Last 3 Encounters:  07/19/17 146 lb (66.2 kg)  07/11/17 143 lb (64.9 kg)  06/27/17 144 lb (65.3 kg)     Other studies Reviewed: Additional studies/ records that were reviewed today include: Office notes, hospital records and testing.  ASSESSMENT AND PLAN:  1. CAD with Recent NSTEMI - Patient was recently admitted on 12/14-12/18/2018 with NSTEMI and underwent a PCI with DES to SVG-OM1, SVG-OM 3. - No long-term aspirin because of the  Eliquis -He has stopped the aspirin and continued the Plavix per instructions.  -He tolerates Mevacor and is on this. -He is not to be on any more beta-blockers (or other rate lowering medications) because of his bradycardia  2. PAF -His heart was regular in rate and rhythm today.  - ECG showed sinus rhythm   3. HTN -Patient is not on any medications for his blood pressure at this time. -SBP in the office today is 138, no medication changes right now. -His wife and he are to continue to track his blood pressure.  They have a follow-up appointment coming up with Dr. Stanford Breed and he can decide at that time if blood pressure medication is needed.  4.  Aortic stenosis: -He has a distinct S2.  Now that his heart rate is improved, he is encouraged to increase his activity and let our office know if he has problems with this. -In the office today and with ambulation, he had no chest pain, was not lightheaded or dizzy.   Current medicines are reviewed at length with the patient today.  The patient does not have concerns regarding medicines.  The following changes have been made:  no change  Labs/ tests ordered today include:   Orders Placed This Encounter  Procedures  . EKG 12-Lead     Disposition:   FU with Dr. Stanford Breed as scheduled  Signed, [name], PA-C  07/19/2017 11:44 AM    [place] Phone: [phone]; Fax: [phone]  This note was written with the assistance  of speech recognition software. Please excuse any transcriptional errors.

## 2017-07-18 NOTE — Telephone Encounter (Signed)
Pt woke up this morning lightheaded.She took his blood pressure it was 170/90 and pulse rate was 49.Pt was given medicine to help bring his pulse rate up.Please call asap to advise.

## 2017-07-18 NOTE — Telephone Encounter (Signed)
Call placed to the wife of the patient to check on how he was feeling. She stated that his heart rate was staying in the mid 50's now and that he was feeling somewhat better. He will be keeping his appointment for tomorrow morning.

## 2017-07-19 ENCOUNTER — Encounter: Payer: Self-pay | Admitting: Physician Assistant

## 2017-07-19 ENCOUNTER — Ambulatory Visit: Payer: PPO | Admitting: Physician Assistant

## 2017-07-19 VITALS — BP 138/78 | HR 64 | Ht 65.0 in | Wt 146.0 lb

## 2017-07-19 DIAGNOSIS — R001 Bradycardia, unspecified: Secondary | ICD-10-CM

## 2017-07-19 DIAGNOSIS — I10 Essential (primary) hypertension: Secondary | ICD-10-CM

## 2017-07-19 DIAGNOSIS — I2510 Atherosclerotic heart disease of native coronary artery without angina pectoris: Secondary | ICD-10-CM | POA: Diagnosis not present

## 2017-07-19 DIAGNOSIS — I350 Nonrheumatic aortic (valve) stenosis: Secondary | ICD-10-CM

## 2017-07-19 DIAGNOSIS — I480 Paroxysmal atrial fibrillation: Secondary | ICD-10-CM | POA: Diagnosis not present

## 2017-07-19 NOTE — Patient Instructions (Signed)
Medication Instructions:  Your physician recommends that you continue on your current medications as directed. Please refer to the Current Medication list given to you today.  AVOID ALL BETA BLOCKERS AND HEART RATE LOWERING MEDICATIONS  Labwork: NONE   Testing/Procedures: NONE   Follow-Up: KEEP YOUR UPCOMING APOINTMENT WITH DR Stanford Breed AS SCHEDULED  Any Other Special Instructions Will Be Listed Below (If Applicable).  If you need a refill on your cardiac medications before your next appointment, please call your pharmacy.

## 2017-07-25 NOTE — Progress Notes (Signed)
HPI: FU CAD s/p CABG (2001) and atrial fibrillation. Monitor March 2016 showed sinus rhythm with PAF. ABIs 7/17 showed vessels noncompressible. Had NSTEMI 12/18; cath showed occluded native vessels, 95 SVG to OM1 (PCI with DES), occluded SVG to OM2 and 3, patent SVG to RCA and patent LIMA to LAD. Echo 12/18 showed EF 40-45, moderate AS (mean gradient 21 mmHg), mild MR and mild LAE. Also on amiodarone for PAF. Metoprolol and cardizem DCed during recent ov due to bradycardia. Since last seen,  patient has dyspnea with more vigorous activities but not routine activities.  No orthopnea, PND, pedal edema, chest pain.  He has occasions where he feels suddenly dizzy for 30 seconds to 1 minute but has not had syncope.  No associated palpitations.  Current Outpatient Medications  Medication Sig Dispense Refill  . acetaminophen (TYLENOL) 500 MG tablet Take 500 mg by mouth daily as needed for moderate pain.    [name] amiodarone (PACERONE) 200 MG tablet Take 200 mg by mouth daily.    [name] apixaban (ELIQUIS) 5 MG TABS tablet Take 1 tablet (5 mg total) by mouth 2 (two) times daily. 60 tablet 11  . BETA CAROTENE PO Take by mouth as directed.    . beta carotene w/minerals (OCUVITE) tablet Take 1 tablet by mouth daily.    . carbidopa-levodopa (SINEMET IR) 25-100 MG tablet Take 1 tablet by mouth 3 (three) times daily. 90 tablet 3  . Carboxymethylcellulose Sodium (THERATEARS OP) Apply 1 drop to eye daily as needed (dry eyes).    . clopidogrel (PLAVIX) 75 MG tablet Take 1 tablet (75 mg total) by mouth daily with breakfast. 90 tablet 3  . docusate sodium (COLACE) 100 MG capsule Take 100 mg by mouth every morning.     . ferrous sulfate 325 (65 FE) MG tablet Take 325 mg by mouth daily with breakfast.     . finasteride (PROSCAR) 5 MG tablet Take 5 mg by mouth every morning.     . lovastatin (MEVACOR) 20 MG tablet Take 1 tablet (20 mg total) by mouth daily with breakfast. 90 tablet 3  . meclizine (ANTIVERT) 25 MG tablet  Take 1 tablet (25 mg total) by mouth 3 (three) times daily as needed for dizziness. 30 tablet 0  . Multiple Vitamin (MULTIVITAMIN WITH MINERALS) TABS tablet Take 1 tablet by mouth daily.    . nitroGLYCERIN (NITROSTAT) 0.4 MG SL tablet Place 1 tablet (0.4 mg total) under the tongue every 5 (five) minutes as needed for chest pain. 25 tablet 1  . Probiotic Product (PROBIOTIC DAILY PO) Take 1 capsule by mouth daily.     No current facility-administered medications for this visit.      Past Medical History:  Diagnosis Date  . Basal cell carcinoma   . BPH (benign prostatic hypertrophy)   . CAD (coronary artery disease) 1991   a. H/o MI w/ PTCA;  b. s/p CABG by Dr Roxan Hockey w/ LIMA-LAD, SVG-OM1-OM3, SVG-D1, SVG-PDA  . CHF (congestive heart failure) ([employer])   . Complication of anesthesia    Wife reports history of confusion after last surgery in 10/2016-lasted a few days  . HTN (hypertension)   . Hyperlipidemia   . Internal hemorrhoids   . Moderate aortic stenosis    a. 08/2014 Echo: EF 55-60%, no rwma, Gr 1 DD, mod AS, mildly dil asc Ao, mild MR, sev dil LA.  . NSTEMI (non-ST elevated myocardial infarction) ([place]) 05/2017    PCI with DES to SVG-OM1. SVG-OM3  occluded, LIMA-LAD and SVG-RCA patent  . PAF (paroxysmal atrial fibrillation) ([place])    a. Dx 08/2014;  b. CHA2DS2VASc = 4 - decision made to withold [place] 2/2 unsteady gait and syncope.  . Sigmoid diverticulitis   . Syncope    a. 08/2014 - ? orthostatic    Past Surgical History:  Procedure Laterality Date  . COLONOSCOPY    . CORONARY ARTERY BYPASS GRAFT  2000   Dr Roxan Hockey  LIMA-LAD, SVG-OM1-OM3, SVG-D1, SVG-PDA.   [name] CORONARY STENT INTERVENTION N/A 06/13/2017   Procedure: CORONARY STENT INTERVENTION;  Surgeon: Lorretta Harp, MD;  Location: [facility];  Service: Cardiovascular;  Laterality: N/A;  . CYSTOSCOPY N/A 12/07/2016   Procedure: CYSTOSCOPY;  Surgeon: Cleon Gustin, MD;  [facility];  Service: Urology;   Laterality: N/A;  NEEDS 30 MIN TOTAL FOR SURGERY  . CYSTOSCOPY WITH URETHRAL DILATATION N/A 02/21/2017   Procedure: DILATATION OF BLADDER NECK WITH INJECTION OF MITOMYCIN C;  Surgeon: Cleon Gustin, MD;  [facility];  Service: Urology;  Laterality: N/A;  . EYE SURGERY     bil cataract  . HIP ARTHROPLASTY Left 10/14/2015   Procedure: LEFT HIP HEMI ARTHROPLASTY;  Surgeon: [name], MD;  Location: [facility];  Service: Orthopedics;  Laterality: Left;  . LEFT HEART CATH AND CORS/GRAFTS ANGIOGRAPHY N/A 06/13/2017   Procedure: LEFT HEART CATH AND CORS/GRAFTS ANGIOGRAPHY;  Surgeon: Lorretta Harp, MD;  Location: [facility];  Service: Cardiovascular;  Laterality: N/A;  . SHOULDER SURGERY     x2  . TONSILLECTOMY    . TRANSTHORACIC ECHOCARDIOGRAM  10-06-2016   dr Stanford Breed   mild LVH, ef 40-45%, diffuse hypokinesis/ moderate AV calcification leaflets and annulus,  severe thickened leaflets, mod.-sev. aortic stenosis (Valva area 0.61cm^2, mean grandient 77mmHg, peak gradiant 62mmHg)/  moderate thickened , mild calcified MV leaflets without stenosis, moderate MR/  severe LAE/ trivial PR and TR  . TRANSURETHRAL RESECTION OF BLADDER NECK N/A 12/07/2016   Procedure: TRANSURETHRAL RESECTION OF BLADDER NECK;  Surgeon: Cleon Gustin, MD;  [facility];  Service: Urology;  Laterality: N/A;  . XI ROBOTIC ASSISTED SIMPLE PROSTATECTOMY N/A 11/04/2016   Procedure: XI ROBOTIC ASSISTED SIMPLE PROSTATECTOMY;  Surgeon: Cleon Gustin, MD;  [facility];  Service: Urology;  Laterality: N/A;    Social History   Socioeconomic History  . Marital status: Married    Spouse name: Not on file  . Number of children: 2  . Years of education: Not on file  . Highest education level: Not on file  Social Needs  . Financial resource strain: Not on file  . Food insecurity - worry: Not on file  . Food insecurity - inability: Not on file  . Transportation needs - medical: Not on file  .  Transportation needs - non-medical: Not on file  Occupational History  . 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 Smoking status: Former Smoker    Packs/day: 0.50    Years: 15.00    Pack years: 7.50    Last attempt to quit: 06/29/1971    Years since quitting: 46.1  . Smokeless tobacco: Never Used  Substance and Sexual Activity  . Alcohol use: Yes    Alcohol/week: 0.6 - 1.2 oz    Types: 1 - 2 Glasses of wine per week    Comment: alternates wine and liquor. 1/2 pint liquor/week  . Drug use: No  . Sexual activity: No    Comment: lives  with wife, retired English as a second language teacher, no dietary restrictions  Other Topics Concern  . Not on file  Social History Narrative   Pt and wife have an Independent home at [facility], wants to move to an apartment soon.    Family History  Problem Relation Age of Onset  . Coronary artery disease Father   . Hyperlipidemia Father   . Heart attack Father   . Alcohol abuse Father   . Bipolar disorder Mother   . Heart disease Mother   . Alcohol abuse Brother   . Cancer Brother   . Cancer Maternal Grandfather        oral cancer, chew tobacco  . Cancer Sister   . Glaucoma Sister   . Alcohol abuse Unknown     ROS: no fevers or chills, productive cough, hemoptysis, dysphasia, odynophagia, melena, hematochezia, dysuria, hematuria, rash, seizure activity, orthopnea, PND, pedal edema, claudication. Remaining systems are negative.  Physical Exam: Well-developed well-nourished in no acute distress.  Skin is warm and dry.  HEENT is normal.  Neck is supple.  Chest is clear to auscultation with normal expansion.  Cardiovascular exam is regular rate and rhythm.  3/6 systolic murmur left sternal border. Abdominal exam nontender or distended. No masses palpated. Extremities show no edema. neuro grossly intact  A/P  1 paroxysmal atrial fibrillation-patient remains in sinus rhythm.  Continue amiodarone.  Continue apixaban.   Check TSH and liver functions.  2 coronary artery disease-continue Plavix.  Continue statin.  No chest pain.  3 hypertension-blood pressure is elevated.  Add amlodipine 2.5 mg daily and follow blood pressure.  Adjust regimen as needed.  4 hyperlipidemia-continue statin.  5 moderate aortic stenosis-he will need follow-up echoes in the future.  6 peripheral vascular disease-continue statin.  7 dizziness-patient has transient dizziness.  Question bradycardia mediated.  Will place event monitor to further assess.  Kirk Ruths, MD

## 2017-08-03 ENCOUNTER — Ambulatory Visit (INDEPENDENT_AMBULATORY_CARE_PROVIDER_SITE_OTHER): Payer: PPO | Admitting: Cardiology

## 2017-08-03 ENCOUNTER — Encounter: Payer: Self-pay | Admitting: Cardiology

## 2017-08-03 ENCOUNTER — Telehealth (HOSPITAL_COMMUNITY): Payer: Self-pay | Admitting: *Deleted

## 2017-08-03 ENCOUNTER — Telehealth (HOSPITAL_COMMUNITY): Payer: Self-pay | Admitting: Pharmacist

## 2017-08-03 VITALS — BP 163/88 | HR 75 | Ht 65.0 in | Wt 145.1 lb

## 2017-08-03 DIAGNOSIS — I480 Paroxysmal atrial fibrillation: Secondary | ICD-10-CM

## 2017-08-03 DIAGNOSIS — I10 Essential (primary) hypertension: Secondary | ICD-10-CM

## 2017-08-03 DIAGNOSIS — R42 Dizziness and giddiness: Secondary | ICD-10-CM

## 2017-08-03 DIAGNOSIS — I2510 Atherosclerotic heart disease of native coronary artery without angina pectoris: Secondary | ICD-10-CM | POA: Diagnosis not present

## 2017-08-03 MED ORDER — AMLODIPINE BESYLATE 2.5 MG PO TABS: 2.5000 mg | ORAL_TABLET | Freq: Every day | ORAL | 3 refills | Status: DC

## 2017-08-03 NOTE — Patient Instructions (Signed)
Medication Instructions:   START AMLODIPINE 2.5 MG ONCE DAILY  Labwork:  Your physician recommends that you HAVE LAB WORK TODAY  Testing/Procedures:  Your physician has recommended that you wear an event monitor. Event monitors are medical devices that record the heart's electrical activity. Doctors most often [place] these monitors to diagnose arrhythmias. Arrhythmias are problems with the speed or rhythm of the heartbeat. The monitor is a small, portable device. You can wear one while you do your normal daily activities. This is usually used to diagnose what is causing palpitations/syncope (passing out).    Follow-Up:  Your physician recommends that you schedule a follow-up appointment in: [place]   If you need a refill on your cardiac medications before your next appointment, please call your pharmacy.

## 2017-08-03 NOTE — Telephone Encounter (Signed)
Lelon Perla, MD  Rowe Pavy, RN        I think he can exercise  Kirk Ruths   Previous Messages    ----- Message -----  From: Rowe Pavy, RN  Sent: 08/03/2017  1:07 PM  To: Lelon Perla, MD   Dr. Stanford Breed  I reviewed your office note. Noted that he will have an event monitor placed. Since the etiology of pt dizziness is unkown and pt reported falls totaling 8 for the past 12 months. Should we hold pt from exercise until further determination is made? There are definite safety concerns in a group setting where we are unable to provided one on one care.  I appreciate your thoughts  Psychologist, clinical, BSN  Cardiac and Pulmonary Rehab Nurse Navigator

## 2017-08-03 NOTE — Telephone Encounter (Signed)
Cardiac Rehab Medication Review by a Pharmacist  Does the patient  feel that his/her medications are working for him/her?  yes  Has the patient been experiencing any side effects to the medications prescribed?  Yes - some dizziness, low heart rate. Changed meds within last month, so this has improved  Does the patient measure his/her own blood pressure or blood glucose at home?  yes - SBP > 150, which is why MD recently added amlodipine  Does the patient have any problems obtaining medications due to transportation or finances?   yes  Understanding of regimen: good Understanding of indications: good Potential of compliance: good    Pharmacist comments: Justin [name] is a pleasant 84 y/o male who endorses compliance with all medications. I also spoke with his wife Mrs. Heckendorn who provided more information, as she is the one who fills his pill boxes. He saw his cardiologist day for ongoing issues with dizziness and bradycardia, which has improved. Of note, Dr. Stanford Breed did start a new BP medication at his appointment today (amlodipine 2.5 mg) which Mr. Bochenek has picked up from the pharmacy and has not started yet.    Charlene Brooke, PharmD PGY1 Pharmacy Resident Email: [email or web address] Pager: [phone] 08/03/2017 4:49 PM

## 2017-08-04 ENCOUNTER — Encounter (HOSPITAL_COMMUNITY): Payer: Self-pay

## 2017-08-04 ENCOUNTER — Telehealth: Payer: Self-pay | Admitting: *Deleted

## 2017-08-04 ENCOUNTER — Encounter (HOSPITAL_COMMUNITY)
Admission: RE | Admit: 2017-08-04 | Discharge: 2017-08-04 | Disposition: A | Discharge status: Home / Self Care | Payer: PPO | Source: Ambulatory Visit | Attending: Cardiology | Admitting: Cardiology

## 2017-08-04 VITALS — BP 132/84 | HR 66 | Ht 65.0 in | Wt 143.7 lb

## 2017-08-04 DIAGNOSIS — R7989 Other specified abnormal findings of blood chemistry: Secondary | ICD-10-CM

## 2017-08-04 DIAGNOSIS — I214 Non-ST elevation (NSTEMI) myocardial infarction: Secondary | ICD-10-CM | POA: Insufficient documentation

## 2017-08-04 DIAGNOSIS — Z955 Presence of coronary angioplasty implant and graft: Secondary | ICD-10-CM | POA: Insufficient documentation

## 2017-08-04 LAB — HEPATIC FUNCTION PANEL
ALT: 13 IU/L (ref 0–44)
AST: 28 IU/L (ref 0–40)
Albumin: 4.1 g/dL (ref 3.5–4.7)
Alkaline Phosphatase: 65 IU/L (ref 39–117)
BILIRUBIN TOTAL: 0.4 mg/dL (ref 0.0–1.2)
BILIRUBIN, DIRECT: 0.14 mg/dL (ref 0.00–0.40)
Total Protein: 6.8 g/dL (ref 6.0–8.5)

## 2017-08-04 LAB — TSH: TSH: 64.51 u[IU]/mL — ABNORMAL HIGH (ref 0.450–4.500)

## 2017-08-04 MED ORDER — LEVOTHYROXINE SODIUM 50 MCG PO TABS: 50.0000 ug | ORAL_TABLET | Freq: Every day | ORAL | 3 refills | Status: DC

## 2017-08-04 NOTE — Telephone Encounter (Signed)
Spoke with pt wife, aware of labs. New script sent to the pharmacy and Lab orders mailed to the pt.

## 2017-08-04 NOTE — Telephone Encounter (Addendum)
-----   Message from Lelon Perla, MD sent at 08/04/2017  7:30 AM EST ----- Add synthroid 50 micrograms daily; TSH and free T4 12 weeks Kirk Ruths  Left message for pt to call

## 2017-08-04 NOTE — Progress Notes (Signed)
Cardiac Individual Treatment Plan  Patient Details  Name: Justin [name] MRN: [id number] Date of Birth: Apr 07, 1929 Referring Provider:     CARDIAC REHAB PHASE II ORIENTATION from 08/04/2017 in [facility]  Referring Provider  Kirk Ruths MD      Initial Encounter Date:    CARDIAC REHAB PHASE II ORIENTATION from 08/04/2017 in [facility]  Date  08/04/17  Referring Provider  Kirk Ruths MD      Visit Diagnosis: NSTEMI (non-ST elevated myocardial infarction) ([place]) 06/10/17  Status post coronary artery stent placement 06/13/17 DES SVG OM  Patient's Home Medications on Admission:  Current Outpatient Medications:  .  acetaminophen (TYLENOL) 500 MG tablet, Take 500 mg by mouth daily as needed for moderate pain., Disp: , Rfl:  .  amiodarone (PACERONE) 200 MG tablet, Take 200 mg by mouth daily., Disp: , Rfl:  .  amLODipine (NORVASC) 2.5 MG tablet, Take 1 tablet (2.5 mg total) by mouth daily., Disp: 180 tablet, Rfl: 3 .  apixaban (ELIQUIS) 5 MG TABS tablet, Take 1 tablet (5 mg total) by mouth 2 (two) times daily., Disp: 60 tablet, Rfl: 11 .  beta carotene w/minerals (OCUVITE) tablet, Take 1 tablet by mouth daily., Disp: , Rfl:  .  carbidopa-levodopa (SINEMET IR) 25-100 MG tablet, Take 1 tablet by mouth 3 (three) times daily., Disp: 90 tablet, Rfl: 3 .  Carboxymethylcellulose Sodium (THERATEARS OP), Apply 1 drop to eye daily as needed (dry eyes)., Disp: , Rfl:  .  clopidogrel (PLAVIX) 75 MG tablet, Take 1 tablet (75 mg total) by mouth daily with breakfast., Disp: 90 tablet, Rfl: 3 .  docusate sodium (COLACE) 100 MG capsule, Take 100 mg by mouth every morning. , Disp: , Rfl:  .  ferrous sulfate 325 (65 FE) MG tablet, Take 325 mg by mouth daily with breakfast. , Disp: , Rfl:  .  finasteride (PROSCAR) 5 MG tablet, Take 5 mg by mouth every morning. , Disp: , Rfl:  .  levothyroxine (SYNTHROID) 50 MCG tablet, Take 1 tablet (50 mcg  total) by mouth daily before breakfast., Disp: 90 tablet, Rfl: 3 .  lovastatin (MEVACOR) 20 MG tablet, Take 1 tablet (20 mg total) by mouth daily with breakfast. (Patient taking differently: Take 20 mg by mouth daily at 6 PM. ), Disp: 90 tablet, Rfl: 3 .  meclizine (ANTIVERT) 25 MG tablet, Take 1 tablet (25 mg total) by mouth 3 (three) times daily as needed for dizziness., Disp: 30 tablet, Rfl: 0 .  Multiple Vitamin (MULTIVITAMIN WITH MINERALS) TABS tablet, Take 1 tablet by mouth daily., Disp: , Rfl:  .  nitroGLYCERIN (NITROSTAT) 0.4 MG SL tablet, Place 1 tablet (0.4 mg total) under the tongue every 5 (five) minutes as needed for chest pain., Disp: 25 tablet, Rfl: 1 .  Probiotic Product (PROBIOTIC DAILY PO), Take 1 capsule by mouth daily., Disp: , Rfl:   Past Medical History: Past Medical History:  Diagnosis Date  . Basal cell carcinoma   . BPH (benign prostatic hypertrophy)   . CAD (coronary artery disease) 1991   a. H/o MI w/ PTCA;  b. s/p CABG by Dr Roxan Hockey w/ LIMA-LAD, SVG-OM1-OM3, SVG-D1, SVG-PDA  . CHF (congestive heart failure) ([place])   . Complication of anesthesia    Wife reports history of confusion after last surgery in 10/2016-lasted a few days  . HTN (hypertension)   . Hyperlipidemia   . Internal hemorrhoids   . Moderate aortic stenosis  a. 08/2014 Echo: EF 55-60%, no rwma, Gr 1 DD, mod AS, mildly dil asc Ao, mild MR, sev dil LA.  . NSTEMI (non-ST elevated myocardial infarction) ([place]) 05/2017    PCI with DES to SVG-OM1. SVG-OM3 occluded, LIMA-LAD and SVG-RCA patent  . PAF (paroxysmal atrial fibrillation) ([employer])    a. Dx 08/2014;  b. CHA2DS2VASc = 4 - decision made to withold [place] 2/2 unsteady gait and syncope.  . Sigmoid diverticulitis   . Syncope    a. 08/2014 - ? orthostatic    Tobacco Use: Social History   Tobacco Use  Smoking Status Former Smoker  . Packs/day: 0.50  . Years: 15.00  . Pack years: 7.50  . Last attempt to quit: 06/29/1971  . Years since quitting:  46.1  Smokeless Tobacco Never Used    Labs: Recent Chemical engineer    Labs for ITP Cardiac and Pulmonary Rehab Latest Ref Rng & Units 02/13/2014 02/28/2015 01/19/2016 05/03/2016 06/11/2017   Cholestrol 0 - 200 mg/dL 167 170 178 - 154   LDLCALC 0 - 99 mg/dL 73 64 60 - 70   HDL >40 mg/dL 80.60 96 102 - 76   Trlycerides <150 mg/dL 65.0 48 81 - 39   Hemoglobin A1c 4.6 - 6.5 % - - - 5.2 -      Capillary Blood Glucose: Lab Results  Component Value Date   GLUCAP 146 (H) 11/24/2016     Exercise Target Goals: Date: 08/04/17  Exercise Program Goal: Individual exercise prescription set using results from initial 6 min walk test and THRR while considering  patient's activity barriers and safety.   Exercise Prescription Goal: Initial exercise prescription builds to 30-45 minutes a day of aerobic activity, 2-3 days per week.  Home exercise guidelines will be given to patient during program as part of exercise prescription that the participant will acknowledge.  Activity Barriers & Risk Stratification: Activity Barriers & Cardiac Risk Stratification - 08/04/17 1459      Activity Barriers & Cardiac Risk Stratification   Activity Barriers  Deconditioning;Other (comment);Balance Concerns;History of Falls    Comments  Left Partial Hip Replacement, Bilateral Shoulder Surgeries     Cardiac Risk Stratification  High       6 Minute Walk: 6 Minute Walk    Row Name 08/04/17 1449         6 Minute Walk   Phase  Initial     Distance  955 feet     Walk Time  6 minutes     # of Rest Breaks  1     MPH  1.81     METS  1.62     RPE  13     Perceived Dyspnea   1     VO2 Peak  5.69     Symptoms  Yes (comment)     Comments  Mild SOB +1, Muscle weakness "tired", pt took 1 rest break for a total of 65 seconds.      Resting HR  67 bpm     Resting BP  132/84     Resting Oxygen Saturation   98 %     Exercise Oxygen Saturation  during 6 min walk  98 %     Max Ex. HR  96 bpm     Max Ex. BP   142/68     2 Minute Post BP  122/60        Oxygen Initial Assessment:   Oxygen Re-Evaluation:   Oxygen Discharge (Final  Oxygen Re-Evaluation):   Initial Exercise Prescription: Initial Exercise Prescription - 08/04/17 1500      Date of Initial Exercise RX and Referring Provider   Date  08/04/17    Referring Provider  Kirk Ruths MD      NuStep   Level  1    SPM  70    Minutes  20    METs  1.3      Arm Ergometer   Level  1    Watts  10    Minutes  10    METs  1.4      Prescription Details   Frequency (times per week)  3x    Duration  Progress to 30 minutes of continuous aerobic without signs/symptoms of physical distress      Intensity   THRR 40-80% of Max Heartrate  53-106    Ratings of Perceived Exertion  11-13    Perceived Dyspnea  0-4      Progression   Progression  Continue progressive overload as per policy without signs/symptoms or physical distress.      Resistance Training   Training Prescription  Yes    Weight  2lbs    Reps  10-15       Perform Capillary Blood Glucose checks as needed.  Exercise Prescription Changes:   Exercise Comments:   Exercise Goals and Review: Exercise Goals    Row Name 08/04/17 1456             Exercise Goals   Increase Physical Activity  Yes       Intervention  Develop an individualized exercise prescription for aerobic and resistive training based on initial evaluation findings, risk stratification, comorbidities and participant's personal goals.;Provide advice, education, support and counseling about physical activity/exercise needs.       Expected Outcomes  Short Term: Attend rehab on a regular basis to increase amount of physical activity.;Long Term: Add in home exercise to make exercise part of routine and to increase amount of physical activity.;Long Term: Exercising regularly at least 3-5 days a week.       Increase Strength and Stamina  Yes       Intervention  Provide advice, education, support and  counseling about physical activity/exercise needs.;Develop an individualized exercise prescription for aerobic and resistive training based on initial evaluation findings, risk stratification, comorbidities and participant's personal goals.       Expected Outcomes  Short Term: Increase workloads from initial exercise prescription for resistance, speed, and METs.;Short Term: Perform resistance training exercises routinely during rehab and add in resistance training at home;Long Term: Improve cardiorespiratory fitness, muscular endurance and strength as measured by increased METs and functional capacity (6MWT)       Able to understand and use rate of perceived exertion (RPE) scale  Yes       Intervention  Provide education and explanation on how to use RPE scale       Expected Outcomes  Short Term: Able to use RPE daily in rehab to express subjective intensity level;Long Term:  Able to use RPE to guide intensity level when exercising independently       Knowledge and understanding of Target Heart Rate Range (THRR)  Yes       Intervention  Provide education and explanation of THRR including how the numbers were predicted and where they are located for reference       Expected Outcomes  Short Term: Able to state/look up THRR;Short Term: Able to use daily as guideline  for intensity in rehab;Long Term: Able to use THRR to govern intensity when exercising independently       Able to check pulse independently  Yes       Intervention  Provide education and demonstration on how to check pulse in carotid and radial arteries.;Review the importance of being able to check your own pulse for safety during independent exercise       Expected Outcomes  Short Term: Able to explain why pulse checking is important during independent exercise;Long Term: Able to check pulse independently and accurately       Understanding of Exercise Prescription  Yes       Intervention  Provide education, explanation, and written materials  on patient's individual exercise prescription       Expected Outcomes  Short Term: Able to explain program exercise prescription;Long Term: Able to explain home exercise prescription to exercise independently          Exercise Goals Re-Evaluation :    Discharge Exercise Prescription (Final Exercise Prescription Changes):   Nutrition:  Target Goals: Understanding of nutrition guidelines, daily intake of sodium 1500mg , cholesterol 200mg , calories 30% from fat and 7% or less from saturated fats, daily to have 5 or more servings of fruits and vegetables.  Biometrics: Pre Biometrics - 08/04/17 1456      Pre Biometrics   Height  5\' 5"  (1.651 m)    Weight  143 lb 11.8 oz (65.2 kg)    Waist Circumference  36.5 inches    Hip Circumference  39.5 inches    Waist to Hip Ratio  0.92 %    BMI (Calculated)  23.92    Triceps Skinfold  23 mm    % Body Fat  27.2 %    Grip Strength  36 kg    Flexibility  11.5 in    Single Leg Stand  0 seconds        Nutrition Therapy Plan and Nutrition Goals: Nutrition Therapy & Goals - 08/04/17 1331      Nutrition Therapy   Diet  Heart Healthy      Intervention Plan   Intervention  Prescribe, educate and counsel regarding individualized specific dietary modifications aiming towards targeted core components such as weight, hypertension, lipid management, diabetes, heart failure and other comorbidities.    Expected Outcomes  Short Term Goal: Understand basic principles of dietary content, such as calories, fat, sodium, cholesterol and nutrients.;Long Term Goal: Adherence to prescribed nutrition plan.       Nutrition Assessments: Nutrition Assessments - 08/04/17 1417      MEDFICTS Scores   Pre Score  121       Nutrition Goals Re-Evaluation:   Nutrition Goals Re-Evaluation:   Nutrition Goals Discharge (Final Nutrition Goals Re-Evaluation):   Psychosocial: Target Goals: Acknowledge presence or absence of significant depression and/or  stress, maximize coping skills, provide positive support system. Participant is able to verbalize types and ability to use techniques and skills needed for reducing stress and depression.  Initial Review & Psychosocial Screening: Initial Psych Review & Screening - 08/04/17 1625      Initial Review   Current issues with  None Identified      Family Dynamics   Good Support System?  Yes Justin [name] has his wife for support.      Barriers   Psychosocial barriers to participate in program  There are no identifiable barriers or psychosocial needs.      Screening Interventions   Interventions  Encouraged to exercise  Expected Outcomes  Long Term Goal: Stressors or current issues are controlled or eliminated.;Short Term goal: Identification and review with participant of any Quality of Life or Depression concerns found by scoring the questionnaire.;Long Term goal: The participant improves quality of Life and PHQ9 Scores as seen by post scores and/or verbalization of changes;Short Term goal: Utilizing psychosocial counselor, staff and physician to assist with identification of specific Stressors or current issues interfering with healing process. Setting desired goal for each stressor or current issue identified.       Quality of Life Scores: Quality of Life - 08/04/17 1458      Quality of Life Scores   Health/Function Pre  13 %    Socioeconomic Pre  26.42 %    Psych/Spiritual Pre  21.43 %    Family Pre  24.7 %    GLOBAL Pre  19 %      Scores of 19 and below usually indicate a poorer quality of life in these areas.  A difference of  2-3 points is a clinically meaningful difference.  A difference of 2-3 points in the total score of the Quality of Life Index has been associated with significant improvement in overall quality of life, self-image, physical symptoms, and general health in studies assessing change in quality of life.  PHQ-9: Recent Review Flowsheet Data    Depression screen [facility] 2/9  07/01/2016 03/15/2016 02/28/2015   Decreased Interest 1 0 0   Down, Depressed, Hopeless 3 0 0   PHQ - 2 Score 4 0 0   Altered sleeping 1 - -   Tired, decreased energy 3 - -   Change in appetite 1 - -   Feeling bad or failure about yourself  1 - -   Trouble concentrating 0 - -   Moving slowly or fidgety/restless 0 - -   Suicidal thoughts 0 - -   PHQ-9 Score 10 - -   Difficult doing work/chores Somewhat difficult - -     Interpretation of Total Score  Total Score Depression Severity:  1-4 = Minimal depression, 5-9 = Mild depression, 10-14 = Moderate depression, 15-19 = Moderately severe depression, 20-27 = Severe depression   Psychosocial Evaluation and Intervention:   Psychosocial Re-Evaluation:   Psychosocial Discharge (Final Psychosocial Re-Evaluation):   Vocational Rehabilitation: Provide vocational rehab assistance to qualifying candidates.   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 Participant will state understanding/return demonstration of topics presented.  Learning Barriers/Preferences: Learning Barriers/Preferences - 08/04/17 1415      Learning Barriers/Preferences   Learning Barriers  Sight    Learning Preferences  Written Material;Skilled Demonstration;Individual Instruction       Education Topics: Count Your Pulse:  -Group instruction provided by verbal instruction, demonstration, patient participation and written materials to support subject.  Instructors address importance of being able to find your pulse and how to count your pulse when at home without a heart monitor.  Patients get hands on experience counting their pulse  with staff help and individually.   Heart Attack, Angina, and Risk Factor Modification:  -Group instruction provided by verbal instruction, video, and written materials to support subject.  Instructors address  signs and symptoms of angina and heart attacks.    Also discuss risk factors for heart disease and how to make changes to improve heart health risk factors.   Functional Fitness:  -Group instruction provided by verbal instruction, demonstration, patient participation, and written materials to support subject.  Instructors address safety measures for doing things around the house.  Discuss how to get up and down off the floor, how to pick things up properly, how to safely get out of a chair without assistance, and balance training.   Meditation and Mindfulness:  -Group instruction provided by verbal instruction, patient participation, and written materials to support subject.  Instructor addresses importance of mindfulness and meditation practice to help reduce stress and improve awareness.  Instructor also leads participants through a meditation exercise.    Stretching for Flexibility and Mobility:  -Group instruction provided by verbal instruction, patient participation, and written materials to support subject.  Instructors lead participants through series of stretches that are designed to increase flexibility thus improving mobility.  These stretches are additional exercise for major muscle groups that are typically performed during regular warm up and cool down.   Hands Only CPR:  -Group verbal, video, and participation provides a basic overview of AHA guidelines for community CPR. Role-play of emergencies allow participants the opportunity to practice calling for help and chest compression technique with discussion of AED use.   Hypertension: -Group verbal and written instruction that provides a basic overview of hypertension including the most recent diagnostic guidelines, risk  factor reduction with self-care instructions and medication management.    Nutrition I class: Heart Healthy Eating:  -Group instruction provided by PowerPoint slides, verbal discussion, and written materials to support subject matter. The instructor gives an explanation and review of the Therapeutic Lifestyle Changes diet recommendations, which includes a discussion on lipid goals, dietary fat, sodium, fiber, plant stanol/sterol esters, sugar, and the components of a well-balanced, healthy diet.   Nutrition II class: Lifestyle Skills:  -Group instruction provided by PowerPoint slides, verbal discussion, and written materials to support subject matter. The instructor gives an explanation and review of label reading, grocery shopping for heart health, heart healthy recipe modifications, and ways to make healthier choices when eating out.   Diabetes Question & Answer:  -Group instruction provided by PowerPoint slides, verbal discussion, and written materials to support subject matter. The instructor gives an explanation and review of diabetes co-morbidities, pre- and post-prandial blood glucose goals, pre-exercise blood glucose goals, signs, symptoms, and treatment of hypoglycemia and hyperglycemia, and foot care basics.   Diabetes Blitz:  -Group instruction provided by PowerPoint slides, verbal discussion, and written materials to support subject matter. The instructor gives an explanation and review of the physiology behind type 1 and type 2 diabetes, diabetes medications and rational behind using different medications, pre- and post-prandial blood glucose recommendations and Hemoglobin A1c goals, diabetes diet, and exercise including blood glucose guidelines for exercising safely.    Portion Distortion:  -Group instruction provided by PowerPoint slides, verbal discussion, written materials, and food models to support subject matter. The instructor gives an explanation of serving size versus  portion size, changes in portions sizes over the last 20 years, and what consists of a serving from each food group.   Stress Management:  -Group instruction provided by verbal instruction, video, and written materials to support subject matter.  Instructors review role of stress in heart disease and how to cope with stress positively.     Exercising on Your Own:  -  Group instruction provided by verbal instruction, power point, and written materials to support subject.  Instructors discuss benefits of exercise, components of exercise, frequency and intensity of exercise, and end points for exercise.  Also discuss use of nitroglycerin and activating EMS.  Review options of places to exercise outside of rehab.  Review guidelines for sex with heart disease.   Cardiac Drugs I:  -Group instruction provided by verbal instruction and written materials to support subject.  Instructor reviews cardiac drug classes: antiplatelets, anticoagulants, beta blockers, and statins.  Instructor discusses reasons, side effects, and lifestyle considerations for each drug class.   Cardiac Drugs II:  -Group instruction provided by verbal instruction and written materials to support subject.  Instructor reviews cardiac drug classes: angiotensin converting enzyme inhibitors (ACE-I), angiotensin II receptor blockers (ARBs), nitrates, and calcium channel blockers.  Instructor discusses reasons, side effects, and lifestyle considerations for each drug class.   Anatomy and Physiology of the Circulatory System:  Group verbal and written instruction and models provide basic cardiac anatomy and physiology, with the coronary electrical and arterial systems. Review of: AMI, Angina, Valve disease, Heart Failure, Peripheral Artery Disease, Cardiac Arrhythmia, Pacemakers, and the ICD.   Other Education:  -Group or individual verbal, written, or video instructions that support the educational goals of the cardiac rehab  program.   Holiday Eating Survival Tips:  -Group instruction provided by PowerPoint slides, verbal discussion, and written materials to support subject matter. The instructor gives patients tips, tricks, and techniques to help them not only survive but enjoy the holidays despite the onslaught of food that accompanies the holidays.   Knowledge Questionnaire Score: Knowledge Questionnaire Score - 08/04/17 1414      Knowledge Questionnaire Score   Pre Score  20/24       Core Components/Risk Factors/Patient Goals at Admission: Personal Goals and Risk Factors at Admission - 08/04/17 1448      Core Components/Risk Factors/Patient Goals on Admission    Weight Management  Yes;Weight Maintenance    Intervention  Weight Management: Develop a combined nutrition and exercise program designed to reach desired caloric intake, while maintaining appropriate intake of nutrient and fiber, sodium and fats, and appropriate energy expenditure required for the weight goal.;Weight Management: Provide education and appropriate resources to help participant work on and attain dietary goals.;Weight Management/Obesity: Establish reasonable short term and long term weight goals.    Admit Weight  143 lb 6.4 oz (65 kg)    Goal Weight: Short Term  140 lb (63.5 kg)    Goal Weight: Long Term  138 lb (62.6 kg)    Expected Outcomes  Short Term: Continue to assess and modify interventions until short term weight is achieved;Long Term: Adherence to nutrition and physical activity/exercise program aimed toward attainment of established weight goal;Weight Maintenance: Understanding of the daily nutrition guidelines, which includes 25-35% calories from fat, 7% or less cal from saturated fats, less than 200mg  cholesterol, less than 1.5gm of sodium, & 5 or more servings of fruits and vegetables daily;Understanding recommendations for meals to include 15-35% energy as protein, 25-35% energy from fat, 35-60% energy from carbohydrates,  less than 200mg  of dietary cholesterol, 20-35 gm of total fiber daily;Understanding of distribution of calorie intake throughout the day with the consumption of 4-5 meals/snacks    Hypertension  Yes    Intervention  Monitor prescription use compliance.;Provide education on lifestyle modifcations including regular physical activity/exercise, weight management, moderate sodium restriction and increased consumption of fresh fruit, vegetables, and low fat dairy, alcohol  moderation, and smoking cessation.    Expected Outcomes  Short Term: Continued assessment and intervention until BP is < 140/46mm HG in hypertensive participants. < 130/56mm HG in hypertensive participants with diabetes, heart failure or chronic kidney disease.;Long Term: Maintenance of blood pressure at goal levels.    Lipids  Yes    Intervention  Provide education and support for participant on nutrition & aerobic/resistive exercise along with prescribed medications to achieve LDL 70mg , HDL >40mg .    Expected Outcomes  Short Term: Participant states understanding of desired cholesterol values and is compliant with medications prescribed. Participant is following exercise prescription and nutrition guidelines.;Long Term: Cholesterol controlled with medications as prescribed, with individualized exercise RX and with personalized nutrition plan. Value goals: LDL < 70mg , HDL > 40 mg.       Core Components/Risk Factors/Patient Goals Review:    Core Components/Risk Factors/Patient Goals at Discharge (Final Review):    ITP Comments: ITP Comments    Row Name 08/04/17 1412           ITP Comments  Dr. Fransico Him, Medical Director          Comments: Justin [name] attended orientation from 1333 to 1453 to review rules and guidelines for program. Completed 6 minute walk test, Intitial ITP, and exercise prescription.  VSS. Telemetry-Sinus Rhythm with an inverted T wave and a first degree heart block.Justin [name] is deconditioned and has poor posture. Justin [name]  uses a rollator to assist with his mobility. Justin [name] did stop to rest for 65 seconds during his walk test and complained of mild shortness of breath. Justin [name] denied feeling any dizziness today during orientation. Justin [name] had no complaints upon exit from cardiac rehab. Vital signs stable. Will review Justin [name]'s quality of life scores next week as they were low in the health and functioning area.Barnet Pall, RN,BSN 08/04/2017 4:39 PM

## 2017-08-04 NOTE — Progress Notes (Signed)
BROOKS KINNAN 84 y.o. male DOB: [birth year] MRN: [id number]      Nutrition Note  Dx; NSTEMI, s/p DES Past Medical History:  Diagnosis Date  . Basal cell carcinoma   . BPH (benign prostatic hypertrophy)   . CAD (coronary artery disease) 1991   a. H/o MI w/ PTCA;  b. s/p CABG by Dr Roxan Hockey w/ LIMA-LAD, SVG-OM1-OM3, SVG-D1, SVG-PDA  . CHF (congestive heart failure) ([place])   . Complication of anesthesia    Wife reports history of confusion after last surgery in 10/2016-lasted a few days  . HTN (hypertension)   . Hyperlipidemia   . Internal hemorrhoids   . Moderate aortic stenosis    a. 08/2014 Echo: EF 55-60%, no rwma, Gr 1 DD, mod AS, mildly dil asc Ao, mild MR, sev dil LA.  . NSTEMI (non-ST elevated myocardial infarction) ([place]) 05/2017    PCI with DES to SVG-OM1. SVG-OM3 occluded, LIMA-LAD and SVG-RCA patent  . PAF (paroxysmal atrial fibrillation) ([place])    a. Dx 08/2014;  b. CHA2DS2VASc = 4 - decision made to withold [place] 2/2 unsteady gait and syncope.  . Sigmoid diverticulitis   . Syncope    a. 08/2014 - ? orthostatic   Meds reviewed. Sinemet noted  HT: Ht Readings from Last 1 Encounters:  08/03/17 5\' 5"  (1.651 m)    WT: Wt Readings from Last 3 Encounters:  08/03/17 145 lb 1.9 oz (65.8 kg)  07/19/17 146 lb (66.2 kg)  07/11/17 143 lb (64.9 kg)     BMI 24.2   Current tobacco use? No   Labs:  Lipid Panel     Component Value Date/Time   CHOL 154 06/11/2017 0406   TRIG 39 06/11/2017 0406   HDL 76 06/11/2017 0406   CHOLHDL 2.0 06/11/2017 0406   VLDL 8 06/11/2017 0406   LDLCALC 70 06/11/2017 0406    Lab Results  Component Value Date   HGBA1C 5.2 05/03/2016   CBG (last 3)  No results for input(s): GLUCAP in the last 72 hours.  Nutrition Note Spoke with pt and pt's wife. Nutrition plan and goals reviewed with pt. Pt is not currently following a Heart Healthy diet according to pt's MEDFICTS score. Pt and pt's wife report having gone through Cardiac Rehab "10-15 years  ago or so and we know what we should be eating." Pt lives in a retirement home, which provides 3 meals/d. Age-appropriate nutrition recommendations discussed. Pt expressed understanding of the information reviewed. Pt aware of nutrition education classes offered and does not plan on attending nutrition classes.  Nutrition Diagnosis Food-and nutrition-related knowledge deficit related to lack of exposure to information as related to diagnosis of: ? CHF  Nutrition Intervention ? Pt's individual nutrition plan and goals reviewed with pt.  Nutrition Goal(s):  ? Pt to describe the benefit of including fruits, vegetables, whole grains, and low-fat dairy products in a heart healthy meal plan.  Plan:  Pt to attend nutrition classes ? Portion Distortion  Will provide client-centered nutrition education as part of interdisciplinary care.   Monitor and evaluate progress toward nutrition goal with team.  Derek Mound, M.Ed, RD, LDN, CDE 08/04/2017 1:28 PM

## 2017-08-08 ENCOUNTER — Encounter (HOSPITAL_COMMUNITY)
Admission: RE | Admit: 2017-08-08 | Discharge: 2017-08-08 | Disposition: A | Discharge status: Home / Self Care | Payer: PPO | Source: Ambulatory Visit | Attending: Cardiology | Admitting: Cardiology

## 2017-08-08 DIAGNOSIS — I214 Non-ST elevation (NSTEMI) myocardial infarction: Secondary | ICD-10-CM

## 2017-08-08 DIAGNOSIS — L300 Nummular dermatitis: Secondary | ICD-10-CM | POA: Diagnosis not present

## 2017-08-08 DIAGNOSIS — Z955 Presence of coronary angioplasty implant and graft: Secondary | ICD-10-CM

## 2017-08-08 DIAGNOSIS — L570 Actinic keratosis: Secondary | ICD-10-CM | POA: Diagnosis not present

## 2017-08-08 NOTE — Progress Notes (Addendum)
Daily Session Note  Patient Details  Name: Justin [name] MRN: [id number] Date of Birth: 1928/07/08 Referring Provider:     CARDIAC REHAB PHASE II ORIENTATION from 08/04/2017 in [place]  Referring Provider  Justin Ruths MD      Encounter Date: 08/08/2017  Check In: Session Check In - 08/08/17 1153      Check-In   Location  [facility]    Staff Present  Justin Russell, MS,ACSM CEP, Exercise Physiologist;Justin Palmeri Venetia Maxon, RN, BSN    Supervising physician immediately available to respond to emergencies  [facility] immediately available    Physician(s)  Justin [name]    Medication changes reported      No    Fall or balance concerns reported     No    Tobacco Cessation  No Change    Warm-up and Cool-down  Performed as group-led instruction    Resistance Training Performed  Yes    VAD Patient?  No      Pain Assessment   Currently in Pain?  No/denies       Capillary Blood Glucose: No results found for this or any previous visit (from the past 24 hour(s)).    Social History   Tobacco Use  Smoking Status Former Smoker  . Packs/day: 0.50  . Years: 15.00  . Pack years: 7.50  . Last attempt to quit: 06/29/1971  . Years since quitting: 46.1  Smokeless Tobacco Never Used    Goals Met:  No report of cardiac concerns or symptoms  Goals Unmet:  Not Applicable  Comments: Pt started cardiac rehab today.  Pt tolerated light exercise without difficulty. VSS, telemetry-Sinus Rhythm with PAC's, asymptomatic.  Medication list reconciled. Pt denies barriers to medicaiton compliance.  PSYCHOSOCIAL ASSESSMENT:  PHQ-0. Pt exhibits positive coping skills, hopeful outlook with supportive family. No psychosocial needs identified at this time, no psychosocial interventions necessary.    Pt enjoys reading taking a walk and watching TV.   Pt oriented to exercise equipment and routine. Justin [name] is deconditioned and uses a rollator for stability. Justin [name]  hopes participating in cardiac rehab will make [name] feel stronger.   Understanding verbalized.Justin Pall, RN,BSN 08/08/2017 12:31 PM   Justin. Fransico [name] is Medical Director for Cardiac Rehab at [facility].

## 2017-08-10 ENCOUNTER — Encounter (HOSPITAL_COMMUNITY)
Admission: RE | Admit: 2017-08-10 | Discharge: 2017-08-10 | Disposition: A | Discharge status: Home / Self Care | Payer: PPO | Source: Ambulatory Visit | Attending: Cardiology | Admitting: Cardiology

## 2017-08-10 ENCOUNTER — Ambulatory Visit (INDEPENDENT_AMBULATORY_CARE_PROVIDER_SITE_OTHER): Payer: PPO

## 2017-08-10 DIAGNOSIS — R42 Dizziness and giddiness: Secondary | ICD-10-CM

## 2017-08-10 DIAGNOSIS — Z955 Presence of coronary angioplasty implant and graft: Secondary | ICD-10-CM

## 2017-08-10 DIAGNOSIS — I214 Non-ST elevation (NSTEMI) myocardial infarction: Secondary | ICD-10-CM

## 2017-08-12 ENCOUNTER — Encounter (HOSPITAL_COMMUNITY)
Admission: RE | Admit: 2017-08-12 | Discharge: 2017-08-12 | Disposition: A | Discharge status: Home / Self Care | Payer: PPO | Source: Ambulatory Visit | Attending: Cardiology | Admitting: Cardiology

## 2017-08-12 DIAGNOSIS — I214 Non-ST elevation (NSTEMI) myocardial infarction: Secondary | ICD-10-CM

## 2017-08-12 DIAGNOSIS — Z955 Presence of coronary angioplasty implant and graft: Secondary | ICD-10-CM

## 2017-08-15 ENCOUNTER — Encounter (HOSPITAL_COMMUNITY)
Admission: RE | Admit: 2017-08-15 | Discharge: 2017-08-15 | Disposition: A | Discharge status: Home / Self Care | Payer: PPO | Source: Ambulatory Visit | Attending: Cardiology | Admitting: Cardiology

## 2017-08-15 DIAGNOSIS — I214 Non-ST elevation (NSTEMI) myocardial infarction: Secondary | ICD-10-CM | POA: Diagnosis not present

## 2017-08-15 DIAGNOSIS — Z955 Presence of coronary angioplasty implant and graft: Secondary | ICD-10-CM

## 2017-08-17 ENCOUNTER — Encounter (HOSPITAL_COMMUNITY)
Admission: RE | Admit: 2017-08-17 | Discharge: 2017-08-17 | Disposition: A | Discharge status: Home / Self Care | Payer: PPO | Source: Ambulatory Visit | Attending: Cardiology | Admitting: Cardiology

## 2017-08-17 DIAGNOSIS — I214 Non-ST elevation (NSTEMI) myocardial infarction: Secondary | ICD-10-CM | POA: Diagnosis not present

## 2017-08-17 DIAGNOSIS — Z955 Presence of coronary angioplasty implant and graft: Secondary | ICD-10-CM

## 2017-08-18 NOTE — Progress Notes (Signed)
Cardiac Individual Treatment Plan  Patient Details  Name: Justin [name] MRN: [id number] Date of Birth: 10-03-1928 Referring Provider:     CARDIAC REHAB PHASE II ORIENTATION from 08/04/2017 in [facility]  Referring Provider  Kirk Ruths MD      Initial Encounter Date:    CARDIAC REHAB PHASE II ORIENTATION from 08/04/2017 in [facility]  Date  08/04/17  Referring Provider  Kirk Ruths MD      Visit Diagnosis: NSTEMI (non-ST elevated myocardial infarction) ([place]) 06/10/17  Status post coronary artery stent placement 06/13/17 DES SVG OM  Patient's Home Medications on Admission:  Current Outpatient Medications:  .  acetaminophen (TYLENOL) 500 MG tablet, Take 500 mg by mouth daily as needed for moderate pain., Disp: , Rfl:  .  amiodarone (PACERONE) 200 MG tablet, Take 200 mg by mouth daily., Disp: , Rfl:  .  amLODipine (NORVASC) 2.5 MG tablet, Take 1 tablet (2.5 mg total) by mouth daily., Disp: 180 tablet, Rfl: 3 .  apixaban (ELIQUIS) 5 MG TABS tablet, Take 1 tablet (5 mg total) by mouth 2 (two) times daily., Disp: 60 tablet, Rfl: 11 .  beta carotene w/minerals (OCUVITE) tablet, Take 1 tablet by mouth daily., Disp: , Rfl:  .  carbidopa-levodopa (SINEMET IR) 25-100 MG tablet, Take 1 tablet by mouth 3 (three) times daily., Disp: 90 tablet, Rfl: 3 .  Carboxymethylcellulose Sodium (THERATEARS OP), Apply 1 drop to eye daily as needed (dry eyes)., Disp: , Rfl:  .  clopidogrel (PLAVIX) 75 MG tablet, Take 1 tablet (75 mg total) by mouth daily with breakfast., Disp: 90 tablet, Rfl: 3 .  docusate sodium (COLACE) 100 MG capsule, Take 100 mg by mouth every morning. , Disp: , Rfl:  .  ferrous sulfate 325 (65 FE) MG tablet, Take 325 mg by mouth daily with breakfast. , Disp: , Rfl:  .  finasteride (PROSCAR) 5 MG tablet, Take 5 mg by mouth every morning. , Disp: , Rfl:  .  levothyroxine (SYNTHROID) 50 MCG tablet, Take 1 tablet (50 mcg  total) by mouth daily before breakfast., Disp: 90 tablet, Rfl: 3 .  lovastatin (MEVACOR) 20 MG tablet, Take 1 tablet (20 mg total) by mouth daily with breakfast. (Patient taking differently: Take 20 mg by mouth daily at 6 PM. ), Disp: 90 tablet, Rfl: 3 .  meclizine (ANTIVERT) 25 MG tablet, Take 1 tablet (25 mg total) by mouth 3 (three) times daily as needed for dizziness., Disp: 30 tablet, Rfl: 0 .  Multiple Vitamin (MULTIVITAMIN WITH MINERALS) TABS tablet, Take 1 tablet by mouth daily., Disp: , Rfl:  .  nitroGLYCERIN (NITROSTAT) 0.4 MG SL tablet, Place 1 tablet (0.4 mg total) under the tongue every 5 (five) minutes as needed for chest pain., Disp: 25 tablet, Rfl: 1 .  Probiotic Product (PROBIOTIC DAILY PO), Take 1 capsule by mouth daily., Disp: , Rfl:   Past Medical History: Past Medical History:  Diagnosis Date  . Basal cell carcinoma   . BPH (benign prostatic hypertrophy)   . CAD (coronary artery disease) 1991   a. H/o MI w/ PTCA;  b. s/p CABG by Dr Roxan Hockey w/ LIMA-LAD, SVG-OM1-OM3, SVG-D1, SVG-PDA  . CHF (congestive heart failure) ([place])   . Complication of anesthesia    Wife reports history of confusion after last surgery in 10/2016-lasted a few days  . HTN (hypertension)   . Hyperlipidemia   . Internal hemorrhoids   . Moderate aortic stenosis  a. 08/2014 Echo: EF 55-60%, no rwma, Gr 1 DD, mod AS, mildly dil asc Ao, mild MR, sev dil LA.  . NSTEMI (non-ST elevated myocardial infarction) ([place]) 05/2017    PCI with DES to SVG-OM1. SVG-OM3 occluded, LIMA-LAD and SVG-RCA patent  . PAF (paroxysmal atrial fibrillation) ([place])    a. Dx 08/2014;  b. CHA2DS2VASc = 4 - decision made to withold [place] 2/2 unsteady gait and syncope.  . Sigmoid diverticulitis   . Syncope    a. 08/2014 - ? orthostatic    Tobacco Use: Social History   Tobacco Use  Smoking Status Former Smoker  . Packs/day: 0.50  . Years: 15.00  . Pack years: 7.50  . Last attempt to quit: 06/29/1971  . Years since quitting:  46.1  Smokeless Tobacco Never Used    Labs: Recent Chemical engineer    Labs for ITP Cardiac and Pulmonary Rehab Latest Ref Rng & Units 02/13/2014 02/28/2015 01/19/2016 05/03/2016 06/11/2017   Cholestrol 0 - 200 mg/dL 167 170 178 - 154   LDLCALC 0 - 99 mg/dL 73 64 60 - 70   HDL >40 mg/dL 80.60 96 102 - 76   Trlycerides <150 mg/dL 65.0 48 81 - 39   Hemoglobin A1c 4.6 - 6.5 % - - - 5.2 -      Capillary Blood Glucose: Lab Results  Component Value Date   GLUCAP 146 (H) 11/24/2016     Exercise Target Goals:    Exercise Program Goal: Individual exercise prescription set using results from initial 6 min walk test and THRR while considering  patient's activity barriers and safety.   Exercise Prescription Goal: Initial exercise prescription builds to 30-45 minutes a day of aerobic activity, 2-3 days per week.  Home exercise guidelines will be given to patient during program as part of exercise prescription that the participant will acknowledge.  Activity Barriers & Risk Stratification: Activity Barriers & Cardiac Risk Stratification - 08/04/17 1459      Activity Barriers & Cardiac Risk Stratification   Activity Barriers  Deconditioning;Other (comment);Balance Concerns;History of Falls    Comments  Left Partial Hip Replacement, Bilateral Shoulder Surgeries     Cardiac Risk Stratification  High       6 Minute Walk: 6 Minute Walk    Row Name 08/04/17 1449         6 Minute Walk   Phase  Initial     Distance  955 feet     Walk Time  6 minutes     # of Rest Breaks  1     MPH  1.81     METS  1.62     RPE  13     Perceived Dyspnea   1     VO2 Peak  5.69     Symptoms  Yes (comment)     Comments  Mild SOB +1, Muscle weakness "tired", pt took 1 rest break for a total of 65 seconds.      Resting HR  67 bpm     Resting BP  132/84     Resting Oxygen Saturation   98 %     Exercise Oxygen Saturation  during 6 min walk  98 %     Max Ex. HR  96 bpm     Max Ex. BP  142/68     2  Minute Post BP  122/60        Oxygen Initial Assessment:   Oxygen Re-Evaluation:   Oxygen Discharge (Final  Oxygen Re-Evaluation):   Initial Exercise Prescription: Initial Exercise Prescription - 08/04/17 1500      Date of Initial Exercise RX and Referring Provider   Date  08/04/17    Referring Provider  Kirk Ruths MD      NuStep   Level  1    SPM  70    Minutes  20    METs  1.3      Arm Ergometer   Level  1    Watts  10    Minutes  10    METs  1.4      Prescription Details   Frequency (times per week)  3x    Duration  Progress to 30 minutes of continuous aerobic without signs/symptoms of physical distress      Intensity   THRR 40-80% of Max Heartrate  53-106    Ratings of Perceived Exertion  11-13    Perceived Dyspnea  0-4      Progression   Progression  Continue progressive overload as per policy without signs/symptoms or physical distress.      Resistance Training   Training Prescription  Yes    Weight  2lbs    Reps  10-15       Perform Capillary Blood Glucose checks as needed.  Exercise Prescription Changes: Exercise Prescription Changes    Row Name 08/08/17 1600 08/17/17 1600           Response to Exercise   Blood Pressure (Admit)  138/80  122/78      Blood Pressure (Exercise)  140/82  124/60      Blood Pressure (Exit)  116/80  118/62      Heart Rate (Admit)  64 bpm  60 bpm      Heart Rate (Exercise)  86 bpm  83 bpm      Heart Rate (Exit)  71 bpm  64 bpm      Rating of Perceived Exertion (Exercise)  12  12      Perceived Dyspnea (Exercise)  0  -      Symptoms  None   -      Comments  Pt oriented to exercise equipement   -      Duration  Progress to 30 minutes of  aerobic without signs/symptoms of physical distress  Progress to 30 minutes of  aerobic without signs/symptoms of physical distress      Intensity  THRR New  THRR unchanged        Progression   Progression  Continue to progress workloads to maintain intensity without  signs/symptoms of physical distress.  Continue to progress workloads to maintain intensity without signs/symptoms of physical distress.      Average METs  2.3  3.4        Resistance Training   Training Prescription  Yes  No      Weight   2lbs  -      Reps  10-15  -      Time  10 Minutes  -        Interval Training   Interval Training  No  No        NuStep   Level  1  3      SPM  75  80      Minutes  20  30      METs  2.8  3.4        Arm Ergometer   Level  1  -  Watts  10  -      Minutes  10  -      METs  1.81  -         Exercise Comments: Exercise Comments    Row Name 08/08/17 1630 08/17/17 1613         Exercise Comments  Pt oriented to exercise equipment today. Pt off to a good start with cardiac rehab.   Pt is responding well to exercise prescription. Pt able to exercise for 30 minutes with minimal difficulty. Will continue to progress and monitor pt.          Exercise Goals and Review: Exercise Goals    Row Name 08/04/17 1456             Exercise Goals   Increase Physical Activity  Yes       Intervention  Develop an individualized exercise prescription for aerobic and resistive training based on initial evaluation findings, risk stratification, comorbidities and participant's personal goals.;Provide advice, education, support and counseling about physical activity/exercise needs.       Expected Outcomes  Short Term: Attend rehab on a regular basis to increase amount of physical activity.;Long Term: Add in home exercise to make exercise part of routine and to increase amount of physical activity.;Long Term: Exercising regularly at least 3-5 days a week.       Increase Strength and Stamina  Yes       Intervention  Provide advice, education, support and counseling about physical activity/exercise needs.;Develop an individualized exercise prescription for aerobic and resistive training based on initial evaluation findings, risk stratification, comorbidities and  participant's personal goals.       Expected Outcomes  Short Term: Increase workloads from initial exercise prescription for resistance, speed, and METs.;Short Term: Perform resistance training exercises routinely during rehab and add in resistance training at home;Long Term: Improve cardiorespiratory fitness, muscular endurance and strength as measured by increased METs and functional capacity (6MWT)       Able to understand and use rate of perceived exertion (RPE) scale  Yes       Intervention  Provide education and explanation on how to use RPE scale       Expected Outcomes  Short Term: Able to use RPE daily in rehab to express subjective intensity level;Long Term:  Able to use RPE to guide intensity level when exercising independently       Knowledge and understanding of Target Heart Rate Range (THRR)  Yes       Intervention  Provide education and explanation of THRR including how the numbers were predicted and where they are located for reference       Expected Outcomes  Short Term: Able to state/look up THRR;Short Term: Able to use daily as guideline for intensity in rehab;Long Term: Able to use THRR to govern intensity when exercising independently       Able to check pulse independently  Yes       Intervention  Provide education and demonstration on how to check pulse in carotid and radial arteries.;Review the importance of being able to check your own pulse for safety during independent exercise       Expected Outcomes  Short Term: Able to explain why pulse checking is important during independent exercise;Long Term: Able to check pulse independently and accurately       Understanding of Exercise Prescription  Yes       Intervention  Provide education, explanation, and written materials on patient's individual  exercise prescription       Expected Outcomes  Short Term: Able to explain program exercise prescription;Long Term: Able to explain home exercise prescription to exercise independently           Exercise Goals Re-Evaluation : Exercise Goals Re-Evaluation    [place] Name 08/17/17 1613             Exercise Goal Re-Evaluation   Exercise Goals Review  Understanding of Exercise Prescription;Knowledge and understanding of Target Heart Rate Range (THRR);Increase Physical Activity;Able to understand and use rate of perceived exertion (RPE) scale;Increase Strength and Stamina;Able to check pulse independently       Comments  Pt is tolerating exercise very well. Will follow up with pt on plans for exercising at home.        Expected Outcomes  Will continue to increase workloads as tolerated by pt. Will continue to build stamnia.            Discharge Exercise Prescription (Final Exercise Prescription Changes): Exercise Prescription Changes - 08/17/17 1600      Response to Exercise   Blood Pressure (Admit)  122/78    Blood Pressure (Exercise)  124/60    Blood Pressure (Exit)  118/62    Heart Rate (Admit)  60 bpm    Heart Rate (Exercise)  83 bpm    Heart Rate (Exit)  64 bpm    Rating of Perceived Exertion (Exercise)  12    Duration  Progress to 30 minutes of  aerobic without signs/symptoms of physical distress    Intensity  THRR unchanged      Progression   Progression  Continue to progress workloads to maintain intensity without signs/symptoms of physical distress.    Average METs  3.4      Resistance Training   Training Prescription  No      Interval Training   Interval Training  No      NuStep   Level  3    SPM  80    Minutes  30    METs  3.4       Nutrition:  Target Goals: Understanding of nutrition guidelines, daily intake of sodium 1500mg , cholesterol 200mg , calories 30% from fat and 7% or less from saturated fats, daily to have 5 or more servings of fruits and vegetables.  Biometrics: Pre Biometrics - 08/04/17 1456      Pre Biometrics   Height  5\' 5"  (1.651 m)    Weight  143 lb 11.8 oz (65.2 kg)    Waist Circumference  36.5 inches    Hip  Circumference  39.5 inches    Waist to Hip Ratio  0.92 %    BMI (Calculated)  23.92    Triceps Skinfold  23 mm    % Body Fat  27.2 %    Grip Strength  36 kg    Flexibility  11.5 in    Single Leg Stand  0 seconds        Nutrition Therapy Plan and Nutrition Goals: Nutrition Therapy & Goals - 08/04/17 1331      Nutrition Therapy   Diet  Heart Healthy      Intervention Plan   Intervention  Prescribe, educate and counsel regarding individualized specific dietary modifications aiming towards targeted core components such as weight, hypertension, lipid management, diabetes, heart failure and other comorbidities.    Expected Outcomes  Short Term Goal: Understand basic principles of dietary content, such as calories, fat, sodium, cholesterol and nutrients.;Long Term Goal:  Adherence to prescribed nutrition plan.       Nutrition Assessments: Nutrition Assessments - 08/04/17 1417      MEDFICTS Scores   Pre Score  121       Nutrition Goals Re-Evaluation:   Nutrition Goals Re-Evaluation:   Nutrition Goals Discharge (Final Nutrition Goals Re-Evaluation):   Psychosocial: Target Goals: Acknowledge presence or absence of significant depression and/or stress, maximize coping skills, provide positive support system. Participant is able to verbalize types and ability to use techniques and skills needed for reducing stress and depression.  Initial Review & Psychosocial Screening: Initial Psych Review & Screening - 08/04/17 1625      Initial Review   Current issues with  None Identified      Family Dynamics   Good Support System?  Yes [name] has his wife for support.      Barriers   Psychosocial barriers to participate in program  There are no identifiable barriers or psychosocial needs.      Screening Interventions   Interventions  Encouraged to exercise    Expected Outcomes  Long Term Goal: Stressors or current issues are controlled or eliminated.;Short Term goal: Identification and  review with participant of any Quality of Life or Depression concerns found by scoring the questionnaire.;Long Term goal: The participant improves quality of Life and PHQ9 Scores as seen by post scores and/or verbalization of changes;Short Term goal: Utilizing psychosocial counselor, staff and physician to assist with identification of specific Stressors or current issues interfering with healing process. Setting desired goal for each stressor or current issue identified.       Quality of Life Scores: Quality of Life - 08/04/17 1458      Quality of Life Scores   Health/Function Pre  13 %    Socioeconomic Pre  26.42 %    Psych/Spiritual Pre  21.43 %    Family Pre  24.7 %    GLOBAL Pre  19 %      Scores of 19 and below usually indicate a poorer quality of life in these areas.  A difference of  2-3 points is a clinically meaningful difference.  A difference of 2-3 points in the total score of the Quality of Life Index has been associated with significant improvement in overall quality of life, self-image, physical symptoms, and general health in studies assessing change in quality of life.  PHQ-9: Recent Review Flowsheet Data    Depression screen [facility] 2/9 08/08/2017 07/01/2016 03/15/2016 02/28/2015   Decreased Interest 0 1 0 0   Down, Depressed, Hopeless 0 3 0 0   PHQ - 2 Score 0 4 0 0   Altered sleeping - 1 - -   Tired, decreased energy - 3 - -   Change in appetite - 1 - -   Feeling bad or failure about yourself  - 1 - -   Trouble concentrating - 0 - -   Moving slowly or fidgety/restless - 0 - -   Suicidal thoughts - 0 - -   PHQ-9 Score - 10 - -   Difficult doing work/chores - Somewhat difficult - -     Interpretation of Total Score  Total Score Depression Severity:  1-4 = Minimal depression, 5-9 = Mild depression, 10-14 = Moderate depression, 15-19 = Moderately severe depression, 20-27 = Severe depression   Psychosocial Evaluation and Intervention:   Psychosocial  Re-Evaluation: Psychosocial Re-Evaluation    Row Name 08/18/17 1110  Psychosocial Re-Evaluation   Current issues with  Current Stress Concerns       Interventions  Stress management education;Encouraged to attend Cardiac Rehabilitation for the exercise       Continue Psychosocial Services   No Follow up required       Comments  [name] will continue to participate in phase 2 cardiac rehab for stress reduction.         Initial Review   Source of Stress Concerns  Chronic Illness          Psychosocial Discharge (Final Psychosocial Re-Evaluation): Psychosocial Re-Evaluation - 08/18/17 1110      Psychosocial Re-Evaluation   Current issues with  Current Stress Concerns    Interventions  Stress management education;Encouraged to attend Cardiac Rehabilitation for the exercise    Continue Psychosocial Services   No Follow up required    Comments  [name] will continue to participate in phase 2 cardiac rehab for stress reduction.      Initial Review   Source of Stress Concerns  Chronic Illness       Vocational Rehabilitation: Provide vocational rehab assistance to qualifying candidates.   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 Participant will state understanding/return demonstration of topics presented.  Learning Barriers/Preferences: Learning Barriers/Preferences - 08/04/17 1415      Learning Barriers/Preferences   Learning Barriers  Sight    Learning Preferences  Written Material;Skilled Demonstration;Individual Instruction       Education Topics: Count Your Pulse:  -Group instruction provided by verbal instruction, demonstration,  patient participation and written materials to support subject.  Instructors address importance of being able to find your pulse and how to count your pulse when at home without a heart monitor.  Patients get hands on experience counting their pulse with staff help and individually.   Heart Attack, Angina, and Risk Factor Modification:  -Group instruction provided by verbal instruction, video, and written materials to support subject.  Instructors address signs and symptoms of angina and heart attacks.    Also discuss risk factors for heart disease and how to make changes to improve heart health risk factors.   Functional Fitness:  -Group instruction provided by verbal instruction, demonstration, patient participation, and written materials to support subject.  Instructors address safety measures for doing things around the house.  Discuss how to get up and down off the floor, how to pick things up properly, how to safely get out of a chair without assistance, and balance training.   Meditation and Mindfulness:  -Group instruction provided by verbal instruction, patient participation, and written materials to support subject.  Instructor addresses importance of mindfulness and meditation practice to help reduce stress and improve awareness.  Instructor also leads participants through a meditation exercise.    Stretching for Flexibility and Mobility:  -Group instruction provided by verbal instruction, patient participation, and written materials to support subject.  Instructors lead participants through series of stretches that are designed to increase flexibility thus improving mobility.  These stretches are additional exercise for major muscle groups that are typically performed during regular warm up and cool down.   Hands Only CPR:  -Group verbal, video, and  participation provides a basic overview of AHA guidelines for community CPR. Role-play of emergencies allow participants the opportunity  to practice calling for help and chest compression technique with discussion of AED use.   Hypertension: -Group verbal and written instruction that provides a basic overview of hypertension including the most recent diagnostic guidelines, risk factor reduction with self-care instructions and medication management.    Nutrition I class: Heart Healthy Eating:  -Group instruction provided by PowerPoint slides, verbal discussion, and written materials to support subject matter. The instructor gives an explanation and review of the Therapeutic Lifestyle Changes diet recommendations, which includes a discussion on lipid goals, dietary fat, sodium, fiber, plant stanol/sterol esters, sugar, and the components of a well-balanced, healthy diet.   Nutrition II class: Lifestyle Skills:  -Group instruction provided by PowerPoint slides, verbal discussion, and written materials to support subject matter. The instructor gives an explanation and review of label reading, grocery shopping for heart health, heart healthy recipe modifications, and ways to make healthier choices when eating out.   Diabetes Question & Answer:  -Group instruction provided by PowerPoint slides, verbal discussion, and written materials to support subject matter. The instructor gives an explanation and review of diabetes co-morbidities, pre- and post-prandial blood glucose goals, pre-exercise blood glucose goals, signs, symptoms, and treatment of hypoglycemia and hyperglycemia, and foot care basics.   Diabetes Blitz:  -Group instruction provided by PowerPoint slides, verbal discussion, and written materials to support subject matter. The instructor gives an explanation and review of the physiology behind type 1 and type 2 diabetes, diabetes medications and rational behind using different medications, pre- and post-prandial blood glucose recommendations and Hemoglobin A1c goals, diabetes diet, and exercise including blood glucose  guidelines for exercising safely.    Portion Distortion:  -Group instruction provided by PowerPoint slides, verbal discussion, written materials, and food models to support subject matter. The instructor gives an explanation of serving size versus portion size, changes in portions sizes over the last 20 years, and what consists of a serving from each food group.   Stress Management:  -Group instruction provided by verbal instruction, video, and written materials to support subject matter.  Instructors review role of stress in heart disease and how to cope with stress positively.     Exercising on Your Own:  -Group instruction provided by verbal instruction, power point, and written materials to support subject.  Instructors discuss benefits of exercise, components of exercise, frequency and intensity of exercise, and end points for exercise.  Also discuss use of nitroglycerin and activating EMS.  Review options of places to exercise outside of rehab.  Review guidelines for sex with heart disease.   Cardiac Drugs I:  -Group instruction provided by verbal instruction and written materials to support subject.  Instructor reviews cardiac drug classes: antiplatelets, anticoagulants, beta blockers, and statins.  Instructor discusses reasons, side effects, and lifestyle considerations for each drug class.   Cardiac Drugs II:  -Group instruction provided by verbal instruction and written materials to support subject.  Instructor reviews cardiac drug classes: angiotensin converting enzyme inhibitors (ACE-I), angiotensin II receptor blockers (ARBs), nitrates, and calcium channel blockers.  Instructor discusses reasons, side effects, and lifestyle considerations for each drug class.   CARDIAC REHAB PHASE II EXERCISE from 08/17/2017 in [place]  Date  08/10/17  Instruction Review Code  2- Demonstrated Understanding      Anatomy and Physiology of the Circulatory System:   Group verbal and written instruction and models provide basic  cardiac anatomy and physiology, with the coronary electrical and arterial systems. Review of: AMI, Angina, Valve disease, Heart Failure, Peripheral Artery Disease, Cardiac Arrhythmia, Pacemakers, and the ICD.   CARDIAC REHAB PHASE II EXERCISE from 08/17/2017 in [place]  Date  08/17/17  Educator  RN  Instruction Review Code  2- Demonstrated Understanding      Other Education:  -Group or individual verbal, written, or video instructions that support the educational goals of the cardiac rehab program.   Holiday Eating Survival Tips:  -Group instruction provided by PowerPoint slides, verbal discussion, and written materials to support subject matter. The instructor gives patients tips, tricks, and techniques to help them not only survive but enjoy the holidays despite the onslaught of food that accompanies the holidays.   Knowledge Questionnaire Score: Knowledge Questionnaire Score - 08/04/17 1414      Knowledge Questionnaire Score   Pre Score  20/24       Core Components/Risk Factors/Patient Goals at Admission: Personal Goals and Risk Factors at Admission - 08/04/17 1448      Core Components/Risk Factors/Patient Goals on Admission    Weight Management  Yes;Weight Maintenance    Intervention  Weight Management: Develop a combined nutrition and exercise program designed to reach desired caloric intake, while maintaining appropriate intake of nutrient and fiber, sodium and fats, and appropriate energy expenditure required for the weight goal.;Weight Management: Provide education and appropriate resources to help participant work on and attain dietary goals.;Weight Management/Obesity: Establish reasonable short term and long term weight goals.    Admit Weight  143 lb 6.4 oz (65 kg)    Goal Weight: Short Term  140 lb (63.5 kg)    Goal Weight: Long Term  138 lb (62.6 kg)    Expected Outcomes   Short Term: Continue to assess and modify interventions until short term weight is achieved;Long Term: Adherence to nutrition and physical activity/exercise program aimed toward attainment of established weight goal;Weight Maintenance: Understanding of the daily nutrition guidelines, which includes 25-35% calories from fat, 7% or less cal from saturated fats, less than 200mg  cholesterol, less than 1.5gm of sodium, & 5 or more servings of fruits and vegetables daily;Understanding recommendations for meals to include 15-35% energy as protein, 25-35% energy from fat, 35-60% energy from carbohydrates, less than 200mg  of dietary cholesterol, 20-35 gm of total fiber daily;Understanding of distribution of calorie intake throughout the day with the consumption of 4-5 meals/snacks    Hypertension  Yes    Intervention  Monitor prescription use compliance.;Provide education on lifestyle modifcations including regular physical activity/exercise, weight management, moderate sodium restriction and increased consumption of fresh fruit, vegetables, and low fat dairy, alcohol moderation, and smoking cessation.    Expected Outcomes  Short Term: Continued assessment and intervention until BP is < 140/55mm HG in hypertensive participants. < 130/80mm HG in hypertensive participants with diabetes, heart failure or chronic kidney disease.;Long Term: Maintenance of blood pressure at goal levels.    Lipids  Yes    Intervention  Provide education and support for participant on nutrition & aerobic/resistive exercise along with prescribed medications to achieve LDL 70mg , HDL >40mg .    Expected Outcomes  Short Term: Participant states understanding of desired cholesterol values and is compliant with medications prescribed. Participant is following exercise prescription and nutrition guidelines.;Long Term: Cholesterol controlled with medications as prescribed, with individualized exercise RX and with personalized nutrition plan. Value  goals: LDL < 70mg , HDL > 40 mg.  Core Components/Risk Factors/Patient Goals Review:  Goals and Risk Factor Review    Row Name 08/18/17 1107             Core Components/Risk Factors/Patient Goals Review   Personal Goals Review  Weight Management/Obesity;Hypertension;Lipids       Review  [name]'s vital signs have been stable at cardiac rehab. [name] is off to a good start to exercise       Expected Outcomes  [name] will continue to particpate in phase 2 cardiac rehab and take his medicaitons as presribed          Core Components/Risk Factors/Patient Goals at Discharge (Final Review):  Goals and Risk Factor Review - 08/18/17 1107      Core Components/Risk Factors/Patient Goals Review   Personal Goals Review  Weight Management/Obesity;Hypertension;Lipids    Review  [name]'s vital signs have been stable at cardiac rehab. [name] is off to a good start to exercise    Expected Outcomes  [name] will continue to particpate in phase 2 cardiac rehab and take his medicaitons as presribed       ITP Comments: ITP Comments    Row Name 08/04/17 1412 08/18/17 1111         ITP Comments  Dr. Fransico Him, Medical Director  30 day ITP review. [name] is of to a good start to exercise.         Comments: See ITP comments.Barnet Pall, RN,BSN 08/18/2017 11:13 AM

## 2017-08-19 ENCOUNTER — Encounter (HOSPITAL_COMMUNITY)
Admission: RE | Admit: 2017-08-19 | Discharge: 2017-08-19 | Disposition: A | Discharge status: Home / Self Care | Payer: PPO | Source: Ambulatory Visit | Attending: Cardiology | Admitting: Cardiology

## 2017-08-19 DIAGNOSIS — I214 Non-ST elevation (NSTEMI) myocardial infarction: Secondary | ICD-10-CM | POA: Diagnosis not present

## 2017-08-19 DIAGNOSIS — Z955 Presence of coronary angioplasty implant and graft: Secondary | ICD-10-CM

## 2017-08-22 ENCOUNTER — Encounter (HOSPITAL_COMMUNITY)
Admission: RE | Admit: 2017-08-22 | Discharge: 2017-08-22 | Disposition: A | Discharge status: Home / Self Care | Payer: PPO | Source: Ambulatory Visit | Attending: Cardiology | Admitting: Cardiology

## 2017-08-22 DIAGNOSIS — I214 Non-ST elevation (NSTEMI) myocardial infarction: Secondary | ICD-10-CM | POA: Diagnosis not present

## 2017-08-22 DIAGNOSIS — Z955 Presence of coronary angioplasty implant and graft: Secondary | ICD-10-CM

## 2017-08-23 ENCOUNTER — Telehealth: Payer: Self-pay | Admitting: Cardiology

## 2017-08-23 NOTE — Telephone Encounter (Signed)
New message     Critical ekg

## 2017-08-23 NOTE — Telephone Encounter (Signed)
Critical EKG notification from [employer]. Patient was not called by monitoring company.  Per chart review, patient was ordered to wear monitor to evaluate source of dizziness. He has known AF - is anticoagulated.   AF - sustained 1 minute HR 120 11:20am CT  Left message for patient to call back to determine if he was symptomatic during this time.   Routed to MD/RN

## 2017-08-23 NOTE — Telephone Encounter (Signed)
Patient returned call. He states he has had no symptoms. He is doing fine.

## 2017-08-24 ENCOUNTER — Encounter (HOSPITAL_COMMUNITY)
Admission: RE | Admit: 2017-08-24 | Discharge: 2017-08-24 | Disposition: A | Discharge status: Home / Self Care | Payer: PPO | Source: Ambulatory Visit | Attending: Cardiology | Admitting: Cardiology

## 2017-08-24 DIAGNOSIS — I214 Non-ST elevation (NSTEMI) myocardial infarction: Secondary | ICD-10-CM

## 2017-08-24 DIAGNOSIS — Z955 Presence of coronary angioplasty implant and graft: Secondary | ICD-10-CM

## 2017-08-26 ENCOUNTER — Encounter: Payer: Self-pay | Admitting: Neurology

## 2017-08-26 ENCOUNTER — Encounter (HOSPITAL_COMMUNITY): Payer: PPO

## 2017-08-26 ENCOUNTER — Ambulatory Visit: Payer: PPO | Admitting: Neurology

## 2017-08-26 ENCOUNTER — Encounter (HOSPITAL_COMMUNITY)
Admission: RE | Admit: 2017-08-26 | Discharge: 2017-08-26 | Disposition: A | Discharge status: Home / Self Care | Payer: PPO | Source: Ambulatory Visit | Attending: Cardiology | Admitting: Cardiology

## 2017-08-26 VITALS — BP 108/80 | HR 72 | Ht 65.0 in | Wt 141.1 lb

## 2017-08-26 DIAGNOSIS — Z955 Presence of coronary angioplasty implant and graft: Secondary | ICD-10-CM | POA: Insufficient documentation

## 2017-08-26 DIAGNOSIS — G20 Parkinson's disease: Secondary | ICD-10-CM | POA: Diagnosis not present

## 2017-08-26 DIAGNOSIS — I214 Non-ST elevation (NSTEMI) myocardial infarction: Secondary | ICD-10-CM | POA: Insufficient documentation

## 2017-08-26 DIAGNOSIS — G3184 Mild cognitive impairment, so stated: Secondary | ICD-10-CM

## 2017-08-26 DIAGNOSIS — G609 Hereditary and idiopathic neuropathy, unspecified: Secondary | ICD-10-CM

## 2017-08-26 MED ORDER — CARBIDOPA-LEVODOPA 25-100 MG PO TABS: 1.5000 | ORAL_TABLET | Freq: Three times a day (TID) | ORAL | 4 refills | Status: DC

## 2017-08-26 NOTE — Progress Notes (Signed)
NEUROLOGY FOLLOW UP OFFICE NOTE  Justin [name] [id number]  HISTORY OF PRESENT ILLNESS: Justin [name] is an 84 year old man with paroxysmal atrial fibrillation, orthostatic hypotension, hypertension, hyperlipidemia, and CAD who follows up for memory problems and continued balance problems.  He is accompanied by his wife who supplements history.   UPDATE: Last visit, he underwent trial of carbidopa-levodopa to see if it helped with gait and balance.  He did not notice a different but his wife thinks it initially seemed to help.  However, he had a NSTEMI requiring a STENT in December.  He had adverse reaction to morphine.  It seemed to be a set back.  Memory and gait is slightly worse.  He is currently participating in cardiac rehab.[name]  HISTORY: He has had problems with balance for 6 years.  He loses balance easily if his eyes are closed or if he stands in one place for several minutes.  He cannot stand on one foot. He also notes trouble walking down hills as well as walking up stairs. When he walks, he will sometimes stumble.  He also has chronic problems with his left foot though, such as bunions. He denies any neck pain or lower back pain radiating down the legs. He denies any sensation of numbness and tingling. He denies focal weakness but occasionally he reports generalized weakness where his legs give out. He denies any bowel or bladder dysfunction. When he closes his eyes, he has a sensation of sway, but no actual vertigo or fullness in his head. He does have occasional tinnitus and wears hearing aids. He has not had any sensation of passing out.     I saw him in 2014 and he underwent a neuropathy workup.  NCV-EMG was normal.  2 hour glucose tolerance test was normal.  B12 was 542, methylmalonic acid 0.11,  Sed Rate 12, folate over 24.8, RPR nonreactive, SPEP and IFE negative, ANA negative.  Recent Hgb A1c wasa 5.2 and TSH was 4.34.   For a couple of years he had had episodes of confusion  where he cannot process or collect his thoughts.  It may last up to an hour and occur once a week.  He may forget medications or names of people.  Each time he has a surgery, he becomes confused and his memory gets worse.  This occurred both after knee surgery and dilatation of bladder neck.  In evaluating memory, B12 from 05/25/16 was 599.  Repeat testing on 11/25/16 was 741.  TSH was 7.432.  He was previously on Aricept which was discontinued due to tiredness and dizziness.   He had had imaging of the head for dizziness.  CT of head from 09/13/14 was personally reviewed and revealed atrophy and chronic small vessel ischemic changes but nothing acute.  Repeat CT of head from 03/23/16 was unchanged.  PAST MEDICAL HISTORY: Past Medical History:  Diagnosis Date  . Basal cell carcinoma   . BPH (benign prostatic hypertrophy)   . CAD (coronary artery disease) 1991   a. H/o MI w/ PTCA;  b. s/p CABG by Dr Roxan Hockey w/ LIMA-LAD, SVG-OM1-OM3, SVG-D1, SVG-PDA  . CHF (congestive heart failure) ([place])   . Complication of anesthesia    Wife reports history of confusion after last surgery in 10/2016-lasted a few days  . HTN (hypertension)   . Hyperlipidemia   . Internal hemorrhoids   . Moderate aortic stenosis    a. 08/2014 Echo: EF 55-60%, no rwma, Gr 1 DD, mod  AS, mildly dil asc Ao, mild MR, sev dil LA.  . NSTEMI (non-ST elevated myocardial infarction) ([place]) 05/2017    PCI with DES to SVG-OM1. SVG-OM3 occluded, LIMA-LAD and SVG-RCA patent  . PAF (paroxysmal atrial fibrillation) ([place])    a. Dx 08/2014;  b. CHA2DS2VASc = 4 - decision made to withold [place] 2/2 unsteady gait and syncope.  . Sigmoid diverticulitis   . Syncope    a. 08/2014 - ? orthostatic    MEDICATIONS: Current Outpatient Medications on File Prior to Visit  Medication Sig Dispense Refill  . acetaminophen (TYLENOL) 500 MG tablet Take 500 mg by mouth daily as needed for moderate pain.    [name] amiodarone (PACERONE) 200 MG tablet Take 200 mg by  mouth daily.    [name] amLODipine (NORVASC) 2.5 MG tablet Take 1 tablet (2.5 mg total) by mouth daily. 180 tablet 3  . apixaban (ELIQUIS) 5 MG TABS tablet Take 1 tablet (5 mg total) by mouth 2 (two) times daily. 60 tablet 11  . beta carotene w/minerals (OCUVITE) tablet Take 1 tablet by mouth daily.    . Carboxymethylcellulose Sodium (THERATEARS OP) Apply 1 drop to eye daily as needed (dry eyes).    . clopidogrel (PLAVIX) 75 MG tablet Take 1 tablet (75 mg total) by mouth daily with breakfast. 90 tablet 3  . docusate sodium (COLACE) 100 MG capsule Take 100 mg by mouth every morning.     . ferrous sulfate 325 (65 FE) MG tablet Take 325 mg by mouth daily with breakfast.     . finasteride (PROSCAR) 5 MG tablet Take 5 mg by mouth every morning.     [name] levothyroxine (SYNTHROID) 50 MCG tablet Take 1 tablet (50 mcg total) by mouth daily before breakfast. 90 tablet 3  . lovastatin (MEVACOR) 20 MG tablet Take 1 tablet (20 mg total) by mouth daily with breakfast. (Patient taking differently: Take 20 mg by mouth daily at 6 PM. ) 90 tablet 3  . meclizine (ANTIVERT) 25 MG tablet Take 1 tablet (25 mg total) by mouth 3 (three) times daily as needed for dizziness. 30 tablet 0  . Multiple Vitamin (MULTIVITAMIN WITH MINERALS) TABS tablet Take 1 tablet by mouth daily.    . nitroGLYCERIN (NITROSTAT) 0.4 MG SL tablet Place 1 tablet (0.4 mg total) under the tongue every 5 (five) minutes as needed for chest pain. 25 tablet 1  . Probiotic Product (PROBIOTIC DAILY PO) Take 1 capsule by mouth daily.     No current facility-administered medications on file prior to visit.     ALLERGIES: Allergies  Allergen Reactions  . Morphine And Related Other (See Comments)    Severe confusion  . Diltiazem Hcl     bradycardia  . Metoprolol     bradycardia  . Toradol [Ketorolac Tromethamine] Other (See Comments)    Dizzy and light headed  . Hydrocodone Other (See Comments)    hallucinations  . Dutasteride Diarrhea    GI upset and  incontinence    FAMILY HISTORY: Family History  Problem Relation Age of Onset  . Coronary artery disease Father   . Hyperlipidemia Father   . Heart attack Father   . Alcohol abuse Father   . Bipolar disorder Mother   . Heart disease Mother   . Alcohol abuse Brother   . Cancer Brother   . Cancer Maternal Grandfather        oral cancer, chew tobacco  . Cancer Sister   . Glaucoma Sister   . Alcohol  abuse Unknown     SOCIAL HISTORY: Social History   Socioeconomic History  . Marital status: Married    Spouse name: Not on file  . Number of children: 2  . Years of education: Not on file  . Highest education level: Not on file  Social Needs  . Financial resource strain: Not very hard  . Food insecurity - worry: Never true  . Food insecurity - inability: Never true  . Transportation needs - medical: No  . Transportation needs - non-medical: No  Occupational History  . 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 Smoking status: Former Smoker    Packs/day: 0.50    Years: 15.00    Pack years: 7.50    Last attempt to quit: 06/29/1971    Years since quitting: 46.1  . Smokeless tobacco: Never Used  Substance and Sexual Activity  . Alcohol use: Yes    Alcohol/week: 0.6 - 1.2 oz    Types: 1 - 2 Glasses of wine per week    Comment: alternates wine and liquor. 1/2 pint liquor/week  . Drug use: No  . 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 Not on file  Social History Narrative   Pt and wife have an Independent home at [facility], wants to move to an apartment soon.    REVIEW OF SYSTEMS: Constitutional: No fevers, chills, or sweats, no generalized fatigue, change in appetite Eyes: No visual changes, double vision, eye pain Ear, nose and throat: No hearing loss, ear pain, nasal congestion, sore throat Cardiovascular: No chest pain,  palpitations Respiratory:  No shortness of breath at rest or with exertion, wheezes GastrointestinaI: No nausea, vomiting, diarrhea, abdominal pain, fecal incontinence Genitourinary:  No dysuria, urinary retention or frequency Musculoskeletal:  No neck pain, back pain Integumentary: No rash, pruritus, skin lesions Neurological: as above Psychiatric: No depression, insomnia, anxiety Endocrine: No palpitations, fatigue, diaphoresis, mood swings, change in appetite, change in weight, increased thirst Hematologic/Lymphatic:  No purpura, petechiae. Allergic/Immunologic: no itchy/runny eyes, nasal congestion, recent allergic reactions, rashes  PHYSICAL EXAM: Vitals:   08/26/17 1109  BP: 108/80  Pulse: 72  SpO2: 98%   General: No acute distress.  Patient appears well-groomed.   Head:  Normocephalic/atraumatic Eyes:  Fundi examined but not visualized Neck: supple, no paraspinal tenderness, full range of motion Heart:  Regular rate and rhythm Lungs:  Clear to auscultation bilaterally Back: No paraspinal tenderness Neurological Exam: alert and oriented to person, place, and time. Attention span and concentration intact, delayed recall, remote memory intact, fund of knowledge intact.  Speech fluent and not dysarthric, language intact.  CN II-XII intact. Bulk normal with trace cogwheel rigidity in wrists, no tremor, bradykinesia, muscle strength 5/5 throughout.  Sensation to light touch  intact.  Deep tendon reflexes 2+ throughout.  Finger to nose testing intact.  Upright posture side-bent right with reduced arm swing.  Reduced stride but able to pick up feet.  Several steps to turn.  Romberg with sway.  IMPRESSION: 1.  Mild cognitive impairment 2.  Gait instability.  He does exhibit parkinsonism.  He exhibits less shuffling than last visit, possibly result of carbidopa-levodopa  PLAN: 1.  We will titrate carbidopa-levodopa up to goal of 1.5 tablets three times daily to see if he notices any  improvement 2.  Follow up in 5 months.  21 minutes spent face to face with patient, over 50% spent discussing  management.  Metta Clines, DO  CC:  [name], MD

## 2017-08-26 NOTE — Patient Instructions (Signed)
1.  We will slowly increase the carbidopa-levodopa to see if it continues to help your balance and walking:  Take 1 tablet in AM, 1 tablet afternoon and 1.5 tablets at bedtime for one week  Then 1.5 tablets in AM, 1 tablet in afternoon and 1.5 tablets at bedtime for one week  Then 1.5 tablets three times daily 2.  Follow up in 5 months.

## 2017-08-29 ENCOUNTER — Encounter (HOSPITAL_COMMUNITY)
Admission: RE | Admit: 2017-08-29 | Discharge: 2017-08-29 | Disposition: A | Discharge status: Home / Self Care | Payer: PPO | Source: Ambulatory Visit | Attending: Cardiology | Admitting: Cardiology

## 2017-08-29 DIAGNOSIS — I214 Non-ST elevation (NSTEMI) myocardial infarction: Secondary | ICD-10-CM

## 2017-08-29 DIAGNOSIS — Z955 Presence of coronary angioplasty implant and graft: Secondary | ICD-10-CM

## 2017-08-29 NOTE — Progress Notes (Signed)
Justin [name] 84 y.o. male DOB: 06-17-1929 MRN: [id number]      Nutrition Note  Dx; NSTEMI, s/p DES  Nutrition Note Spoke with pt . Nutrition plan reviewed with pt. Pt is not currently following a Heart Healthy diet according to pt's MEDFICTS score. Pt lives in a retirement home, which provides 3 meals/d. Age-appropriate nutrition recommendations discussed. Pt expressed understanding of the information reviewed. Pt aware of nutrition education classes offered.  Nutrition Diagnosis Food-and nutrition-related knowledge deficit related to lack of exposure to information as related to diagnosis of: ? CHF  Nutrition Intervention ? Pt's individual nutrition plan and goals reviewed with pt.  Nutrition Goal(s):  ? Pt to describe the benefit of including fruits, vegetables, whole grains, and low-fat dairy products in a heart healthy meal plan.  Plan:  Pt to attend nutrition classes ? Portion Distortion  Will provide client-centered nutrition education as part of interdisciplinary care.   Monitor and evaluate progress toward nutrition goal with team.  Derek Mound, M.Ed, RD, LDN, CDE 08/29/2017 11:51 AM

## 2017-08-31 ENCOUNTER — Encounter (HOSPITAL_COMMUNITY)
Admission: RE | Admit: 2017-08-31 | Discharge: 2017-08-31 | Disposition: A | Discharge status: Home / Self Care | Payer: PPO | Source: Ambulatory Visit | Attending: Cardiology | Admitting: Cardiology

## 2017-08-31 DIAGNOSIS — I214 Non-ST elevation (NSTEMI) myocardial infarction: Secondary | ICD-10-CM

## 2017-08-31 DIAGNOSIS — Z955 Presence of coronary angioplasty implant and graft: Secondary | ICD-10-CM

## 2017-09-02 ENCOUNTER — Encounter (HOSPITAL_COMMUNITY)
Admission: RE | Admit: 2017-09-02 | Discharge: 2017-09-02 | Disposition: A | Discharge status: Home / Self Care | Payer: PPO | Source: Ambulatory Visit | Attending: Cardiology | Admitting: Cardiology

## 2017-09-02 DIAGNOSIS — Z955 Presence of coronary angioplasty implant and graft: Secondary | ICD-10-CM

## 2017-09-02 DIAGNOSIS — I214 Non-ST elevation (NSTEMI) myocardial infarction: Secondary | ICD-10-CM | POA: Diagnosis not present

## 2017-09-05 ENCOUNTER — Encounter (HOSPITAL_COMMUNITY)
Admission: RE | Admit: 2017-09-05 | Discharge: 2017-09-05 | Disposition: A | Discharge status: Home / Self Care | Payer: PPO | Source: Ambulatory Visit | Attending: Cardiology | Admitting: Cardiology

## 2017-09-05 DIAGNOSIS — Z955 Presence of coronary angioplasty implant and graft: Secondary | ICD-10-CM

## 2017-09-05 DIAGNOSIS — I214 Non-ST elevation (NSTEMI) myocardial infarction: Secondary | ICD-10-CM

## 2017-09-07 ENCOUNTER — Encounter (HOSPITAL_COMMUNITY)
Admission: RE | Admit: 2017-09-07 | Discharge: 2017-09-07 | Disposition: A | Discharge status: Home / Self Care | Payer: PPO | Source: Ambulatory Visit | Attending: Cardiology | Admitting: Cardiology

## 2017-09-07 DIAGNOSIS — I214 Non-ST elevation (NSTEMI) myocardial infarction: Secondary | ICD-10-CM

## 2017-09-07 DIAGNOSIS — Z955 Presence of coronary angioplasty implant and graft: Secondary | ICD-10-CM

## 2017-09-07 NOTE — Progress Notes (Signed)
I have reviewed a Home Exercise Prescription with [name] . Justin [name] is currently exercising at home one day.  The patient was advised to walk and use the Nustep at home 2  days a week for 30-45 minutes. Justin [name] was encouraged to always use his rollator when walking at home to be extra careful to help prevent future falls. [name] and I discussed how to progress their exercise prescription. The patient stated that they understand the exercise prescription.  We reviewed exercise guidelines, target heart rate during exercise, weather, endpoints for exercise, and goals.  Patient is encouraged to come to me with any questions. I will continue to follow up with the patient to assist them with progression and safety.   Carma Lair 09/07/2017 2:54 PM

## 2017-09-08 ENCOUNTER — Telehealth: Payer: Self-pay | Admitting: Neurology

## 2017-09-08 NOTE — Telephone Encounter (Signed)
Patient wife called and left a message on the voicemail stating that we needed to call in the correct dosage on the carbidopa levodopa  This is Dr Tomi Likens patient I picked the wrong Dr

## 2017-09-08 NOTE — Telephone Encounter (Signed)
Spoke with Mrs. Echevarria she was concern because of Rx bottle stating 1 tablet TID of Carbidopa-levodopa but patient was prescribed 1.5 tablet. After speaking with [facility] they had refilled the wrong Rx and will refill the 1.5 tablets for the patient which was sent 08/26/17

## 2017-09-09 ENCOUNTER — Encounter (HOSPITAL_COMMUNITY)
Admission: RE | Admit: 2017-09-09 | Discharge: 2017-09-09 | Disposition: A | Discharge status: Home / Self Care | Payer: PPO | Source: Ambulatory Visit | Attending: Cardiology | Admitting: Cardiology

## 2017-09-09 DIAGNOSIS — Z955 Presence of coronary angioplasty implant and graft: Secondary | ICD-10-CM

## 2017-09-09 DIAGNOSIS — I214 Non-ST elevation (NSTEMI) myocardial infarction: Secondary | ICD-10-CM | POA: Diagnosis not present

## 2017-09-12 ENCOUNTER — Encounter (HOSPITAL_COMMUNITY)
Admission: RE | Admit: 2017-09-12 | Discharge: 2017-09-12 | Disposition: A | Discharge status: Home / Self Care | Payer: PPO | Source: Ambulatory Visit | Attending: Cardiology | Admitting: Cardiology

## 2017-09-12 DIAGNOSIS — Z955 Presence of coronary angioplasty implant and graft: Secondary | ICD-10-CM

## 2017-09-12 DIAGNOSIS — I214 Non-ST elevation (NSTEMI) myocardial infarction: Secondary | ICD-10-CM | POA: Diagnosis not present

## 2017-09-14 ENCOUNTER — Encounter (HOSPITAL_COMMUNITY)
Admission: RE | Admit: 2017-09-14 | Discharge: 2017-09-14 | Disposition: A | Discharge status: Home / Self Care | Payer: PPO | Source: Ambulatory Visit | Attending: Cardiology | Admitting: Cardiology

## 2017-09-14 DIAGNOSIS — Z955 Presence of coronary angioplasty implant and graft: Secondary | ICD-10-CM

## 2017-09-14 DIAGNOSIS — I214 Non-ST elevation (NSTEMI) myocardial infarction: Secondary | ICD-10-CM

## 2017-09-15 ENCOUNTER — Encounter (HOSPITAL_COMMUNITY): Payer: Self-pay

## 2017-09-15 NOTE — Progress Notes (Signed)
Cardiac Individual Treatment Plan  Patient Details  Name: Justin [name] MRN: [id number] Date of Birth: 10-06-1928 Referring Provider:     CARDIAC REHAB PHASE II ORIENTATION from 08/04/2017 in [place]  Referring Provider  Kirk Ruths MD      Initial Encounter Date:    CARDIAC REHAB PHASE II ORIENTATION from 08/04/2017 in [place]  Date  08/04/17  Referring Provider  Kirk Ruths MD      Visit Diagnosis: NSTEMI (non-ST elevated myocardial infarction) ([place]) 06/10/17  Status post coronary artery stent placement 06/13/17 DES SVG OM  Patient's Home Medications on Admission:  Current Outpatient Medications:  .  acetaminophen (TYLENOL) 500 MG tablet, Take 500 mg by mouth daily as needed for moderate pain., Disp: , Rfl:  .  amiodarone (PACERONE) 200 MG tablet, Take 200 mg by mouth daily., Disp: , Rfl:  .  amLODipine (NORVASC) 2.5 MG tablet, Take 1 tablet (2.5 mg total) by mouth daily., Disp: 180 tablet, Rfl: 3 .  apixaban (ELIQUIS) 5 MG TABS tablet, Take 1 tablet (5 mg total) by mouth 2 (two) times daily., Disp: 60 tablet, Rfl: 11 .  beta carotene w/minerals (OCUVITE) tablet, Take 1 tablet by mouth daily., Disp: , Rfl:  .  carbidopa-levodopa (SINEMET IR) 25-100 MG tablet, Take 1.5 tablets by mouth 3 (three) times daily., Disp: 150 tablet, Rfl: 4 .  Carboxymethylcellulose Sodium (THERATEARS OP), Apply 1 drop to eye daily as needed (dry eyes)., Disp: , Rfl:  .  clopidogrel (PLAVIX) 75 MG tablet, Take 1 tablet (75 mg total) by mouth daily with breakfast., Disp: 90 tablet, Rfl: 3 .  docusate sodium (COLACE) 100 MG capsule, Take 100 mg by mouth every morning. , Disp: , Rfl:  .  ferrous sulfate 325 (65 FE) MG tablet, Take 325 mg by mouth daily with breakfast. , Disp: , Rfl:  .  finasteride (PROSCAR) 5 MG tablet, Take 5 mg by mouth every morning. , Disp: , Rfl:  .  levothyroxine (SYNTHROID) 50 MCG tablet, Take 1 tablet (50  mcg total) by mouth daily before breakfast., Disp: 90 tablet, Rfl: 3 .  lovastatin (MEVACOR) 20 MG tablet, Take 1 tablet (20 mg total) by mouth daily with breakfast. (Patient taking differently: Take 20 mg by mouth daily at 6 PM. ), Disp: 90 tablet, Rfl: 3 .  meclizine (ANTIVERT) 25 MG tablet, Take 1 tablet (25 mg total) by mouth 3 (three) times daily as needed for dizziness., Disp: 30 tablet, Rfl: 0 .  Multiple Vitamin (MULTIVITAMIN WITH MINERALS) TABS tablet, Take 1 tablet by mouth daily., Disp: , Rfl:  .  nitroGLYCERIN (NITROSTAT) 0.4 MG SL tablet, Place 1 tablet (0.4 mg total) under the tongue every 5 (five) minutes as needed for chest pain., Disp: 25 tablet, Rfl: 1 .  Probiotic Product (PROBIOTIC DAILY PO), Take 1 capsule by mouth daily., Disp: , Rfl:   Past Medical History: Past Medical History:  Diagnosis Date  . Basal cell carcinoma   . BPH (benign prostatic hypertrophy)   . CAD (coronary artery disease) 1991   a. H/o MI w/ PTCA;  b. s/p CABG by Dr Roxan Hockey w/ LIMA-LAD, SVG-OM1-OM3, SVG-D1, SVG-PDA  . CHF (congestive heart failure) ([place])   . Complication of anesthesia    Wife reports history of confusion after last surgery in 10/2016-lasted a few days  . HTN (hypertension)   . Hyperlipidemia   . Internal hemorrhoids   . Moderate aortic stenosis  a. 08/2014 Echo: EF 55-60%, no rwma, Gr 1 DD, mod AS, mildly dil asc Ao, mild MR, sev dil LA.  . NSTEMI (non-ST elevated myocardial infarction) ([place]) 05/2017    PCI with DES to SVG-OM1. SVG-OM3 occluded, LIMA-LAD and SVG-RCA patent  . PAF (paroxysmal atrial fibrillation) ([place])    a. Dx 08/2014;  b. CHA2DS2VASc = 4 - decision made to withold [place] 2/2 unsteady gait and syncope.  . Sigmoid diverticulitis   . Syncope    a. 08/2014 - ? orthostatic    Tobacco Use: Social History   Tobacco Use  Smoking Status Former Smoker  . Packs/day: 0.50  . Years: 15.00  . Pack years: 7.50  . Last attempt to quit: 06/29/1971  . Years since  quitting: 46.2  Smokeless Tobacco Never Used    Labs: Recent Chemical engineer    Labs for ITP Cardiac and Pulmonary Rehab Latest Ref Rng & Units 02/13/2014 02/28/2015 01/19/2016 05/03/2016 06/11/2017   Cholestrol 0 - 200 mg/dL 167 170 178 - 154   LDLCALC 0 - 99 mg/dL 73 64 60 - 70   HDL >40 mg/dL 80.60 96 102 - 76   Trlycerides <150 mg/dL 65.0 48 81 - 39   Hemoglobin A1c 4.6 - 6.5 % - - - 5.2 -      Capillary Blood Glucose: Lab Results  Component Value Date   GLUCAP 146 (H) 11/24/2016     Exercise Target Goals:    Exercise Program Goal: Individual exercise prescription set using results from initial 6 min walk test and THRR while considering  patient's activity barriers and safety.   Exercise Prescription Goal: Initial exercise prescription builds to 30-45 minutes a day of aerobic activity, 2-3 days per week.  Home exercise guidelines will be given to patient during program as part of exercise prescription that the participant will acknowledge.  Activity Barriers & Risk Stratification: Activity Barriers & Cardiac Risk Stratification - 08/04/17 1459      Activity Barriers & Cardiac Risk Stratification   Activity Barriers  Deconditioning;Other (comment);Balance Concerns;History of Falls    Comments  Left Partial Hip Replacement, Bilateral Shoulder Surgeries     Cardiac Risk Stratification  High       6 Minute Walk: 6 Minute Walk    Row Name 08/04/17 1449         6 Minute Walk   Phase  Initial     Distance  955 feet     Walk Time  6 minutes     # of Rest Breaks  1     MPH  1.81     METS  1.62     RPE  13     Perceived Dyspnea   1     VO2 Peak  5.69     Symptoms  Yes (comment)     Comments  Mild SOB +1, Muscle weakness "tired", pt took 1 rest break for a total of 65 seconds.      Resting HR  67 bpm     Resting BP  132/84     Resting Oxygen Saturation   98 %     Exercise Oxygen Saturation  during 6 min walk  98 %     Max Ex. HR  96 bpm     Max Ex. BP  142/68      2 Minute Post BP  122/60        Oxygen Initial Assessment:   Oxygen Re-Evaluation:   Oxygen Discharge (Final  Oxygen Re-Evaluation):   Initial Exercise Prescription: Initial Exercise Prescription - 08/04/17 1500      Date of Initial Exercise RX and Referring Provider   Date  08/04/17    Referring Provider  Kirk Ruths MD      NuStep   Level  1    SPM  70    Minutes  20    METs  1.3      Arm Ergometer   Level  1    Watts  10    Minutes  10    METs  1.4      Prescription Details   Frequency (times per week)  3x    Duration  Progress to 30 minutes of continuous aerobic without signs/symptoms of physical distress      Intensity   THRR 40-80% of Max Heartrate  53-106    Ratings of Perceived Exertion  11-13    Perceived Dyspnea  0-4      Progression   Progression  Continue progressive overload as per policy without signs/symptoms or physical distress.      Resistance Training   Training Prescription  Yes    Weight  2lbs    Reps  10-15       Perform Capillary Blood Glucose checks as needed.  Exercise Prescription Changes: Exercise Prescription Changes    Row Name 08/08/17 1600 08/17/17 1600 08/31/17 1602 09/14/17 1700       Response to Exercise   Blood Pressure (Admit)  138/80  122/78  120/60  122/60    Blood Pressure (Exercise)  140/82  124/60  136/80  154/68    Blood Pressure (Exit)  116/80  118/62  114/70  124/60    Heart Rate (Admit)  64 bpm  60 bpm  69 bpm  57 bpm    Heart Rate (Exercise)  86 bpm  83 bpm  88 bpm  91 bpm    Heart Rate (Exit)  71 bpm  64 bpm  68 bpm  60 bpm    Rating of Perceived Exertion (Exercise)  12  12  11  12     Perceived Dyspnea (Exercise)  0  -  -  -    Symptoms  None   -  None  None    Comments  Pt oriented to exercise equipement   -  -  -    Duration  Progress to 30 minutes of  aerobic without signs/symptoms of physical distress  Progress to 30 minutes of  aerobic without signs/symptoms of physical distress  Continue  with 30 min of aerobic exercise without signs/symptoms of physical distress.  Continue with 30 min of aerobic exercise without signs/symptoms of physical distress.    Intensity  THRR New  THRR unchanged  THRR unchanged  THRR unchanged      Progression   Progression  Continue to progress workloads to maintain intensity without signs/symptoms of physical distress.  Continue to progress workloads to maintain intensity without signs/symptoms of physical distress.  Continue to progress workloads to maintain intensity without signs/symptoms of physical distress.  Continue to progress workloads to maintain intensity without signs/symptoms of physical distress.    Average METs  2.3  3.4  3.9  4.6      Resistance Training   Training Prescription  Yes  No  No  Yes    Weight   2lbs  -  -  2lbs    Reps  10-15  -  -  10-15    Time  10 Minutes  -  -  10 Minutes      Interval Training   Interval Training  No  No  No  No      NuStep   Level  1  3  3  4     SPM  75  80  80  85    Minutes  20  30  30  30     METs  2.8  3.4  3.9  4.6      Arm Ergometer   Level  1  -  -  -    Watts  10  -  -  -    Minutes  10  -  -  -    METs  1.81  -  -  -      Home Exercise Plan   Plans to continue exercise at  -  -  -  Home (comment) Walking, Nustep, Chair Exercises    Frequency  -  -  -  Add 2 additional days to program exercise sessions.    Initial Home Exercises Provided  -  -  -  09/07/17       Exercise Comments: Exercise Comments    Row Name 08/08/17 1630 08/17/17 1613 09/07/17 1434 09/14/17 1717     Exercise Comments  Pt oriented to exercise equipment today. Pt off to a good start with cardiac rehab.   Pt is responding well to exercise prescription. Pt able to exercise for 30 minutes with minimal difficulty. Will continue to progress and monitor pt.   Reviewed HEP with pt. Pt has access to Nustep at home. Pt also walks, but was reminded to take extra steps to avoid falling, pt is a high fall risk. Will  continue to monitor and follow up with pt about home exercise program.   Pt is responding well to exercise prescription. Pt is very motivated to exercise. Will continue to progress pt as tolerated and monitor.        Exercise Goals and Review: Exercise Goals    Row Name 08/04/17 1456             Exercise Goals   Increase Physical Activity  Yes       Intervention  Develop an individualized exercise prescription for aerobic and resistive training based on initial evaluation findings, risk stratification, comorbidities and participant's personal goals.;Provide advice, education, support and counseling about physical activity/exercise needs.       Expected Outcomes  Short Term: Attend rehab on a regular basis to increase amount of physical activity.;Long Term: Add in home exercise to make exercise part of routine and to increase amount of physical activity.;Long Term: Exercising regularly at least 3-5 days a week.       Increase Strength and Stamina  Yes       Intervention  Provide advice, education, support and counseling about physical activity/exercise needs.;Develop an individualized exercise prescription for aerobic and resistive training based on initial evaluation findings, risk stratification, comorbidities and participant's personal goals.       Expected Outcomes  Short Term: Increase workloads from initial exercise prescription for resistance, speed, and METs.;Short Term: Perform resistance training exercises routinely during rehab and add in resistance training at home;Long Term: Improve cardiorespiratory fitness, muscular endurance and strength as measured by increased METs and functional capacity (6MWT)       Able to understand and use rate of perceived exertion (RPE) scale  Yes       Intervention  Provide education and explanation on how to use RPE scale       Expected Outcomes  Short Term: Able to use RPE daily in rehab to express subjective intensity level;Long Term:  Able to use RPE  to guide intensity level when exercising independently       Knowledge and understanding of Target Heart Rate Range (THRR)  Yes       Intervention  Provide education and explanation of THRR including how the numbers were predicted and where they are located for reference       Expected Outcomes  Short Term: Able to state/look up THRR;Short Term: Able to use daily as guideline for intensity in rehab;Long Term: Able to use THRR to govern intensity when exercising independently       Able to check pulse independently  Yes       Intervention  Provide education and demonstration on how to check pulse in carotid and radial arteries.;Review the importance of being able to check your own pulse for safety during independent exercise       Expected Outcomes  Short Term: Able to explain why pulse checking is important during independent exercise;Long Term: Able to check pulse independently and accurately       Understanding of Exercise Prescription  Yes       Intervention  Provide education, explanation, and written materials on patient's individual exercise prescription       Expected Outcomes  Short Term: Able to explain program exercise prescription;Long Term: Able to explain home exercise prescription to exercise independently          Exercise Goals Re-Evaluation : Exercise Goals Re-Evaluation    Row Name 08/17/17 1613 09/07/17 1438 09/14/17 1722         Exercise Goal Re-Evaluation   Exercise Goals Review  Understanding of Exercise Prescription;Knowledge and understanding of Target Heart Rate Range (THRR);Increase Physical Activity;Able to understand and use rate of perceived exertion (RPE) scale;Increase Strength and Stamina;Able to check pulse independently  Understanding of Exercise Prescription;Knowledge and understanding of Target Heart Rate Range (THRR);Able to understand and use rate of perceived exertion (RPE) scale;Increase Physical Activity;Increase Strength and Stamina  Understanding of  Exercise Prescription;Increase Strength and Stamina;Increase Physical Activity     Comments  Pt is tolerating exercise very well. Will follow up with pt on plans for exercising at home.   Reviewed HEP with pt, including weather precuations, RPE scale, THRR, NTG use, endpoints of exercise, warmup and cool down.  Pt is making great improvements with exercise in rehab. Pt is now able to do standing stretches more independently due to increased stamina. Will continue to monitor and progress pt.      Expected Outcomes  Will continue to increase workloads as tolerated by pt. Will continue to build stamnia.   Pt will exercise 2 addiitonal days at home using the Nustep, walking and chair exericses.  Pt will continue to exercise 2 days at home fitness center. Pt will continue to improve cardiovascular endurance and balance.          Discharge Exercise Prescription (Final Exercise Prescription Changes): Exercise Prescription Changes - 09/14/17 1700      Response to Exercise   Blood Pressure (Admit)  122/60    Blood Pressure (Exercise)  154/68    Blood Pressure (Exit)  124/60    Heart Rate (Admit)  57 bpm    Heart Rate (Exercise)  91 bpm    Heart Rate (Exit)  60 bpm    Rating  of Perceived Exertion (Exercise)  12    Symptoms  None    Duration  Continue with 30 min of aerobic exercise without signs/symptoms of physical distress.    Intensity  THRR unchanged      Progression   Progression  Continue to progress workloads to maintain intensity without signs/symptoms of physical distress.    Average METs  4.6      Resistance Training   Training Prescription  Yes    Weight  2lbs    Reps  10-15    Time  10 Minutes      Interval Training   Interval Training  No      NuStep   Level  4    SPM  85    Minutes  30    METs  4.6      Home Exercise Plan   Plans to continue exercise at  Home (comment) Walking, Nustep, Chair Exercises    Frequency  Add 2 additional days to program exercise sessions.     Initial Home Exercises Provided  09/07/17       Nutrition:  Target Goals: Understanding of nutrition guidelines, daily intake of sodium 1500mg , cholesterol 200mg , calories 30% from fat and 7% or less from saturated fats, daily to have 5 or more servings of fruits and vegetables.  Biometrics: Pre Biometrics - 08/04/17 1456      Pre Biometrics   Height  5\' 5"  (1.651 m)    Weight  143 lb 11.8 oz (65.2 kg)    Waist Circumference  36.5 inches    Hip Circumference  39.5 inches    Waist to Hip Ratio  0.92 %    BMI (Calculated)  23.92    Triceps Skinfold  23 mm    % Body Fat  27.2 %    Grip Strength  36 kg    Flexibility  11.5 in    Single Leg Stand  0 seconds        Nutrition Therapy Plan and Nutrition Goals: Nutrition Therapy & Goals - 08/04/17 1331      Nutrition Therapy   Diet  Heart Healthy      Intervention Plan   Intervention  Prescribe, educate and counsel regarding individualized specific dietary modifications aiming towards targeted core components such as weight, hypertension, lipid management, diabetes, heart failure and other comorbidities.    Expected Outcomes  Short Term Goal: Understand basic principles of dietary content, such as calories, fat, sodium, cholesterol and nutrients.;Long Term Goal: Adherence to prescribed nutrition plan.       Nutrition Assessments: Nutrition Assessments - 08/04/17 1417      MEDFICTS Scores   Pre Score  121       Nutrition Goals Re-Evaluation:   Nutrition Goals Re-Evaluation:   Nutrition Goals Discharge (Final Nutrition Goals Re-Evaluation):   Psychosocial: Target Goals: Acknowledge presence or absence of significant depression and/or stress, maximize coping skills, provide positive support system. Participant is able to verbalize types and ability to use techniques and skills needed for reducing stress and depression.  Initial Review & Psychosocial Screening: Initial Psych Review & Screening - 08/04/17 1625       Initial Review   Current issues with  None Identified      Family Dynamics   Good Support System?  Yes [name] has his wife for support.      Barriers   Psychosocial barriers to participate in program  There are no identifiable barriers or psychosocial needs.  Screening Interventions   Interventions  Encouraged to exercise    Expected Outcomes  Long Term Goal: Stressors or current issues are controlled or eliminated.;Short Term goal: Identification and review with participant of any Quality of Life or Depression concerns found by scoring the questionnaire.;Long Term goal: The participant improves quality of Life and PHQ9 Scores as seen by post scores and/or verbalization of changes;Short Term goal: Utilizing psychosocial counselor, staff and physician to assist with identification of specific Stressors or current issues interfering with healing process. Setting desired goal for each stressor or current issue identified.       Quality of Life Scores: Quality of Life - 08/04/17 1458      Quality of Life Scores   Health/Function Pre  13 %    Socioeconomic Pre  26.42 %    Psych/Spiritual Pre  21.43 %    Family Pre  24.7 %    GLOBAL Pre  19 %      Scores of 19 and below usually indicate a poorer quality of life in these areas.  A difference of  2-3 points is a clinically meaningful difference.  A difference of 2-3 points in the total score of the Quality of Life Index has been associated with significant improvement in overall quality of life, self-image, physical symptoms, and general health in studies assessing change in quality of life.  PHQ-9: Recent Review Flowsheet Data    Depression screen [facility] 2/9 08/08/2017 07/01/2016 03/15/2016 02/28/2015   Decreased Interest 0 1 0 0   Down, Depressed, Hopeless 0 3 0 0   PHQ - 2 Score 0 4 0 0   Altered sleeping - 1 - -   Tired, decreased energy - 3 - -   Change in appetite - 1 - -   Feeling bad or failure about yourself  - 1 - -   Trouble  concentrating - 0 - -   Moving slowly or fidgety/restless - 0 - -   Suicidal thoughts - 0 - -   PHQ-9 Score - 10 - -   Difficult doing work/chores - Somewhat difficult - -     Interpretation of Total Score  Total Score Depression Severity:  1-4 = Minimal depression, 5-9 = Mild depression, 10-14 = Moderate depression, 15-19 = Moderately severe depression, 20-27 = Severe depression   Psychosocial Evaluation and Intervention:   Psychosocial Re-Evaluation: Psychosocial Re-Evaluation    Row Name 08/18/17 1110             Psychosocial Re-Evaluation   Current issues with  Current Stress Concerns       Interventions  Stress management education;Encouraged to attend Cardiac Rehabilitation for the exercise       Continue Psychosocial Services   No Follow up required       Comments  [name] will continue to participate in phase 2 cardiac rehab for stress reduction.         Initial Review   Source of Stress Concerns  Chronic Illness          Psychosocial Discharge (Final Psychosocial Re-Evaluation): Psychosocial Re-Evaluation - 08/18/17 1110      Psychosocial Re-Evaluation   Current issues with  Current Stress Concerns    Interventions  Stress management education;Encouraged to attend Cardiac Rehabilitation for the exercise    Continue Psychosocial Services   No Follow up required    Comments  [name] will continue to participate in phase 2 cardiac rehab for stress reduction.      Initial Review  Source of Stress Concerns  Chronic Illness       Vocational Rehabilitation: Provide vocational rehab assistance to qualifying candidates.   Vocational Rehab Evaluation & Intervention: Vocational Rehab - 08/04/17 1623      Initial Vocational Rehab Evaluation & Intervention   Assessment shows need for Vocational Rehabilitation  No Mr Maish has been retired for 25 years and does not need vocational rehab       Education: Education Goals: Education classes will be provided on a weekly  basis, covering required topics. Participant will state understanding/return demonstration of topics presented.  Learning Barriers/Preferences: Learning Barriers/Preferences - 08/04/17 1415      Learning Barriers/Preferences   Learning Barriers  Sight    Learning Preferences  Written Material;Skilled Demonstration;Individual Instruction       Education Topics: Count Your Pulse:  -Group instruction provided by verbal instruction, demonstration, patient participation and written materials to support subject.  Instructors address importance of being able to find your pulse and how to count your pulse when at home without a heart monitor.  Patients get hands on experience counting their pulse with staff help and individually.   Heart Attack, Angina, and Risk Factor Modification:  -Group instruction provided by verbal instruction, video, and written materials to support subject.  Instructors address signs and symptoms of angina and heart attacks.    Also discuss risk factors for heart disease and how to make changes to improve heart health risk factors.   CARDIAC REHAB PHASE II EXERCISE from 08/31/2017 in [place]  Date  08/24/17  Educator  RN  Instruction Review Code  2- Demonstrated Understanding      Functional Fitness:  -Group instruction provided by verbal instruction, demonstration, patient participation, and written materials to support subject.  Instructors address safety measures for doing things around the house.  Discuss how to get up and down off the floor, how to pick things up properly, how to safely get out of a chair without assistance, and balance training.   CARDIAC REHAB PHASE II EXERCISE from 09/09/2017 in [place]  Date  09/09/17  Instruction Review Code  2- Demonstrated Understanding      Meditation and Mindfulness:  -Group instruction provided by verbal instruction, patient participation, and written  materials to support subject.  Instructor addresses importance of mindfulness and meditation practice to help reduce stress and improve awareness.  Instructor also leads participants through a meditation exercise.    CARDIAC REHAB PHASE II EXERCISE from 09/14/2017 in [place]  Date  09/14/17  Educator  Jeanella Craze  Instruction Review Code  2- Demonstrated Understanding      Stretching for Flexibility and Mobility:  -Group instruction provided by verbal instruction, patient participation, and written materials to support subject.  Instructors lead participants through series of stretches that are designed to increase flexibility thus improving mobility.  These stretches are additional exercise for major muscle groups that are typically performed during regular warm up and cool down.   Hands Only CPR:  -Group verbal, video, and participation provides a basic overview of AHA guidelines for community CPR. Role-play of emergencies allow participants the opportunity to practice calling for help and chest compression technique with discussion of AED use.   Hypertension: -Group verbal and written instruction that provides a basic overview of hypertension including the most recent diagnostic guidelines, risk factor reduction with self-care instructions and medication management.    Nutrition I class: Heart Healthy  Eating:  -Group instruction provided by PowerPoint slides, verbal discussion, and written materials to support subject matter. The instructor gives an explanation and review of the Therapeutic Lifestyle Changes diet recommendations, which includes a discussion on lipid goals, dietary fat, sodium, fiber, plant stanol/sterol esters, sugar, and the components of a well-balanced, healthy diet.   Nutrition II class: Lifestyle Skills:  -Group instruction provided by PowerPoint slides, verbal discussion, and written materials to support subject matter. The  instructor gives an explanation and review of label reading, grocery shopping for heart health, heart healthy recipe modifications, and ways to make healthier choices when eating out.   Diabetes Question & Answer:  -Group instruction provided by PowerPoint slides, verbal discussion, and written materials to support subject matter. The instructor gives an explanation and review of diabetes co-morbidities, pre- and post-prandial blood glucose goals, pre-exercise blood glucose goals, signs, symptoms, and treatment of hypoglycemia and hyperglycemia, and foot care basics.   CARDIAC REHAB PHASE II EXERCISE from 08/31/2017 in [place]  Date  08/19/17  Educator  RD  Instruction Review Code  2- Demonstrated Understanding      Diabetes Blitz:  -Group instruction provided by PowerPoint slides, verbal discussion, and written materials to support subject matter. The instructor gives an explanation and review of the physiology behind type 1 and type 2 diabetes, diabetes medications and rational behind using different medications, pre- and post-prandial blood glucose recommendations and Hemoglobin A1c goals, diabetes diet, and exercise including blood glucose guidelines for exercising safely.    Portion Distortion:  -Group instruction provided by PowerPoint slides, verbal discussion, written materials, and food models to support subject matter. The instructor gives an explanation of serving size versus portion size, changes in portions sizes over the last 20 years, and what consists of a serving from each food group.   Stress Management:  -Group instruction provided by verbal instruction, video, and written materials to support subject matter.  Instructors review role of stress in heart disease and how to cope with stress positively.     Exercising on Your Own:  -Group instruction provided by verbal instruction, power point, and written materials to support subject.   Instructors discuss benefits of exercise, components of exercise, frequency and intensity of exercise, and end points for exercise.  Also discuss use of nitroglycerin and activating EMS.  Review options of places to exercise outside of rehab.  Review guidelines for sex with heart disease.   CARDIAC REHAB PHASE II EXERCISE from 08/31/2017 in [place]  Date  08/31/17  Instruction Review Code  2- Demonstrated Understanding      Cardiac Drugs I:  -Group instruction provided by verbal instruction and written materials to support subject.  Instructor reviews cardiac drug classes: antiplatelets, anticoagulants, beta blockers, and statins.  Instructor discusses reasons, side effects, and lifestyle considerations for each drug class.   Cardiac Drugs II:  -Group instruction provided by verbal instruction and written materials to support subject.  Instructor reviews cardiac drug classes: angiotensin converting enzyme inhibitors (ACE-I), angiotensin II receptor blockers (ARBs), nitrates, and calcium channel blockers.  Instructor discusses reasons, side effects, and lifestyle considerations for each drug class.   CARDIAC REHAB PHASE II EXERCISE from 08/31/2017 in [place]  Date  08/10/17  Instruction Review Code  2- Demonstrated Understanding      Anatomy and Physiology of the Circulatory System:  Group verbal and written instruction and models provide basic cardiac anatomy and physiology, with  the coronary electrical and arterial systems. Review of: AMI, Angina, Valve disease, Heart Failure, Peripheral Artery Disease, Cardiac Arrhythmia, Pacemakers, and the ICD.   CARDIAC REHAB PHASE II EXERCISE from 08/31/2017 in [place]  Date  08/17/17  Educator  RN  Instruction Review Code  2- Demonstrated Understanding      Other Education:  -Group or individual verbal, written, or video instructions that support the  educational goals of the cardiac rehab program.   Holiday Eating Survival Tips:  -Group instruction provided by PowerPoint slides, verbal discussion, and written materials to support subject matter. The instructor gives patients tips, tricks, and techniques to help them not only survive but enjoy the holidays despite the onslaught of food that accompanies the holidays.   Knowledge Questionnaire Score: Knowledge Questionnaire Score - 08/04/17 1414      Knowledge Questionnaire Score   Pre Score  20/24       Core Components/Risk Factors/Patient Goals at Admission: Personal Goals and Risk Factors at Admission - 08/04/17 1448      Core Components/Risk Factors/Patient Goals on Admission    Weight Management  Yes;Weight Maintenance    Intervention  Weight Management: Develop a combined nutrition and exercise program designed to reach desired caloric intake, while maintaining appropriate intake of nutrient and fiber, sodium and fats, and appropriate energy expenditure required for the weight goal.;Weight Management: Provide education and appropriate resources to help participant work on and attain dietary goals.;Weight Management/Obesity: Establish reasonable short term and long term weight goals.    Admit Weight  143 lb 6.4 oz (65 kg)    Goal Weight: Short Term  140 lb (63.5 kg)    Goal Weight: Long Term  138 lb (62.6 kg)    Expected Outcomes  Short Term: Continue to assess and modify interventions until short term weight is achieved;Long Term: Adherence to nutrition and physical activity/exercise program aimed toward attainment of established weight goal;Weight Maintenance: Understanding of the daily nutrition guidelines, which includes 25-35% calories from fat, 7% or less cal from saturated fats, less than 200mg  cholesterol, less than 1.5gm of sodium, & 5 or more servings of fruits and vegetables daily;Understanding recommendations for meals to include 15-35% energy as protein, 25-35% energy from  fat, 35-60% energy from carbohydrates, less than 200mg  of dietary cholesterol, 20-35 gm of total fiber daily;Understanding of distribution of calorie intake throughout the day with the consumption of 4-5 meals/snacks    Hypertension  Yes    Intervention  Monitor prescription use compliance.;Provide education on lifestyle modifcations including regular physical activity/exercise, weight management, moderate sodium restriction and increased consumption of fresh fruit, vegetables, and low fat dairy, alcohol moderation, and smoking cessation.    Expected Outcomes  Short Term: Continued assessment and intervention until BP is < 140/13mm HG in hypertensive participants. < 130/58mm HG in hypertensive participants with diabetes, heart failure or chronic kidney disease.;Long Term: Maintenance of blood pressure at goal levels.    Lipids  Yes    Intervention  Provide education and support for participant on nutrition & aerobic/resistive exercise along with prescribed medications to achieve LDL 70mg , HDL >40mg .    Expected Outcomes  Short Term: Participant states understanding of desired cholesterol values and is compliant with medications prescribed. Participant is following exercise prescription and nutrition guidelines.;Long Term: Cholesterol controlled with medications as prescribed, with individualized exercise RX and with personalized nutrition plan. Value goals: LDL < 70mg , HDL > 40 mg.       Core Components/Risk Factors/Patient Goals Review:  Goals and Risk Factor Review    Row Name 08/18/17 1107 09/15/17 1225           Core Components/Risk Factors/Patient Goals Review   Personal Goals Review  Weight Management/Obesity;Hypertension;Lipids  Weight Management/Obesity;Hypertension;Lipids      Review  [name]'s vital signs have been stable at cardiac rehab. [name] is off to a good start to exercise  [name]'s vital signs have been stable at cardiac rehab. [name] is doing well with exercsie and enjoys coming to class.       Expected Outcomes  [name] will continue to particpate in phase 2 cardiac rehab and take his medicaitons as presribed  [name] will continue to particpate in phase 2 cardiac rehab and take his medicaitons as presribed         Core Components/Risk Factors/Patient Goals at Discharge (Final Review):  Goals and Risk Factor Review - 09/15/17 1225      Core Components/Risk Factors/Patient Goals Review   Personal Goals Review  Weight Management/Obesity;Hypertension;Lipids    Review  [name]'s vital signs have been stable at cardiac rehab. [name] is doing well with exercsie and enjoys coming to class.    Expected Outcomes  [name] will continue to particpate in phase 2 cardiac rehab and take his medicaitons as presribed       ITP Comments: ITP Comments    Row Name 08/04/17 1412 08/18/17 1111 09/15/17 1222       ITP Comments  Dr. Fransico Him, Medical Director  30 day ITP review. [name] is of to a good start to exercise.  30 day ITP review.  Patient with good attendance and partcipation in phase 2 cardiac rehab        Comments: See ITP comments.Barnet Pall, RN,BSN 09/15/2017 1:19 PM

## 2017-09-16 ENCOUNTER — Encounter (HOSPITAL_COMMUNITY): Payer: PPO

## 2017-09-19 ENCOUNTER — Telehealth: Payer: Self-pay | Admitting: Cardiology

## 2017-09-19 ENCOUNTER — Encounter (HOSPITAL_COMMUNITY)
Admission: RE | Admit: 2017-09-19 | Discharge: 2017-09-19 | Disposition: A | Discharge status: Home / Self Care | Payer: PPO | Source: Ambulatory Visit | Attending: Cardiovascular Disease | Admitting: Cardiovascular Disease

## 2017-09-19 DIAGNOSIS — I214 Non-ST elevation (NSTEMI) myocardial infarction: Secondary | ICD-10-CM | POA: Diagnosis not present

## 2017-09-19 DIAGNOSIS — Z955 Presence of coronary angioplasty implant and graft: Secondary | ICD-10-CM

## 2017-09-19 NOTE — Telephone Encounter (Signed)
Made pt wife, Apolonio Schneiders, okay per DPR, aware of monitor results.

## 2017-09-19 NOTE — Telephone Encounter (Signed)
Justin [name] is returning a call about Justin [name] . Please call

## 2017-09-21 ENCOUNTER — Encounter (HOSPITAL_COMMUNITY): Payer: PPO

## 2017-09-21 ENCOUNTER — Telehealth (HOSPITAL_COMMUNITY): Payer: Self-pay | Admitting: Family Medicine

## 2017-09-22 ENCOUNTER — Telehealth: Payer: Self-pay | Admitting: Cardiology

## 2017-09-22 ENCOUNTER — Telehealth: Payer: Self-pay | Admitting: Neurology

## 2017-09-22 NOTE — Telephone Encounter (Signed)
Called and advsd [place]

## 2017-09-22 NOTE — Telephone Encounter (Signed)
Spoke to wife. She states patient is having increase dizziness and Symptoms of like he going to pass out for at least week .  Yesterday patient had an episode. Wife states patient  Increase sinemet to 1.5 tablet  3 x daily per DR JAFFE. Wife states they are in the   RN asked wife if she contacted pcp. Wife stated she thought she called that office , RN informed her she called  Cardiologist. She states she will call primary

## 2017-09-22 NOTE — Telephone Encounter (Signed)
He may continue carbidopa-levodopa 1 tablet three times daily.  If he continues to feel dizzy, he should contact his PCP.

## 2017-09-22 NOTE — Telephone Encounter (Signed)
Pt c/o medication issue:  1. Name of Medication: carbidopa-levodopa (SINEMET IR) 25-100 MG tablet  2. How are you currently taking this medication (dosage and times per day)? Take 1.5 tablets by mouth 3 (three) times daily. 3. Are you having a reaction (difficulty breathing--STAT)? no 4. What is your medication issue? Pt wife said that she has been gradually increasing his medication and pt is starting to get  Lightheaded and having fainting spells

## 2017-09-22 NOTE — Telephone Encounter (Signed)
Pt's spouse left a VM message saying pt is have some allergic reactions to a medication Dr Tomi Likens prescribed, did not say what the medication was and wanted to know how to correct this

## 2017-09-22 NOTE — Telephone Encounter (Signed)
Called and spoke with Justin [name]. She states carbidopa levodopa was increased to 1 1/2 tabs TID 4 days ago. Has been somewhat off balance, this morning is extremely dizzy, felt faint trying to reach bathroom, had to lay down. Should medication be adjusted? No problem with 1 tab TID. Report from Cardiology (heart monitor) was normal

## 2017-09-22 NOTE — Telephone Encounter (Signed)
Patient's wife called back with the Carbidopa Levodopa. He was taking  1 tab 3 x a day no problem. It was increased to 1 1/2  3 x daily 25- 100 mg and she feels it may be too much. Please Call. Thanks

## 2017-09-23 ENCOUNTER — Encounter (HOSPITAL_COMMUNITY)
Admission: RE | Admit: 2017-09-23 | Discharge: 2017-09-23 | Disposition: A | Discharge status: Home / Self Care | Payer: PPO | Source: Ambulatory Visit | Attending: Cardiovascular Disease | Admitting: Cardiovascular Disease

## 2017-09-23 DIAGNOSIS — Z955 Presence of coronary angioplasty implant and graft: Secondary | ICD-10-CM

## 2017-09-23 DIAGNOSIS — I214 Non-ST elevation (NSTEMI) myocardial infarction: Secondary | ICD-10-CM

## 2017-09-26 ENCOUNTER — Encounter (HOSPITAL_COMMUNITY)
Admission: RE | Admit: 2017-09-26 | Discharge: 2017-09-26 | Disposition: A | Discharge status: Home / Self Care | Payer: PPO | Source: Ambulatory Visit | Attending: Cardiology | Admitting: Cardiology

## 2017-09-26 DIAGNOSIS — I214 Non-ST elevation (NSTEMI) myocardial infarction: Secondary | ICD-10-CM | POA: Insufficient documentation

## 2017-09-26 DIAGNOSIS — Z955 Presence of coronary angioplasty implant and graft: Secondary | ICD-10-CM | POA: Diagnosis not present

## 2017-09-28 ENCOUNTER — Encounter (HOSPITAL_COMMUNITY)
Admission: RE | Admit: 2017-09-28 | Discharge: 2017-09-28 | Disposition: A | Discharge status: Home / Self Care | Payer: PPO | Source: Ambulatory Visit | Attending: Cardiology | Admitting: Cardiology

## 2017-09-28 DIAGNOSIS — I214 Non-ST elevation (NSTEMI) myocardial infarction: Secondary | ICD-10-CM | POA: Diagnosis not present

## 2017-09-28 DIAGNOSIS — Z955 Presence of coronary angioplasty implant and graft: Secondary | ICD-10-CM

## 2017-09-30 ENCOUNTER — Encounter (HOSPITAL_COMMUNITY)
Admission: RE | Admit: 2017-09-30 | Discharge: 2017-09-30 | Disposition: A | Discharge status: Home / Self Care | Payer: PPO | Source: Ambulatory Visit | Attending: Cardiology | Admitting: Cardiology

## 2017-09-30 DIAGNOSIS — Z955 Presence of coronary angioplasty implant and graft: Secondary | ICD-10-CM

## 2017-09-30 DIAGNOSIS — I214 Non-ST elevation (NSTEMI) myocardial infarction: Secondary | ICD-10-CM | POA: Diagnosis not present

## 2017-10-03 ENCOUNTER — Encounter (HOSPITAL_COMMUNITY)
Admission: RE | Admit: 2017-10-03 | Discharge: 2017-10-03 | Disposition: A | Discharge status: Home / Self Care | Payer: PPO | Source: Ambulatory Visit | Attending: Cardiology | Admitting: Cardiology

## 2017-10-03 DIAGNOSIS — L300 Nummular dermatitis: Secondary | ICD-10-CM | POA: Diagnosis not present

## 2017-10-03 DIAGNOSIS — D485 Neoplasm of uncertain behavior of skin: Secondary | ICD-10-CM | POA: Diagnosis not present

## 2017-10-03 DIAGNOSIS — L720 Epidermal cyst: Secondary | ICD-10-CM | POA: Diagnosis not present

## 2017-10-03 DIAGNOSIS — I214 Non-ST elevation (NSTEMI) myocardial infarction: Secondary | ICD-10-CM | POA: Diagnosis not present

## 2017-10-03 DIAGNOSIS — Z955 Presence of coronary angioplasty implant and graft: Secondary | ICD-10-CM

## 2017-10-05 ENCOUNTER — Encounter (HOSPITAL_COMMUNITY)
Admission: RE | Admit: 2017-10-05 | Discharge: 2017-10-05 | Disposition: A | Discharge status: Home / Self Care | Payer: PPO | Source: Ambulatory Visit | Attending: Cardiology | Admitting: Cardiology

## 2017-10-05 DIAGNOSIS — I214 Non-ST elevation (NSTEMI) myocardial infarction: Secondary | ICD-10-CM | POA: Diagnosis not present

## 2017-10-05 DIAGNOSIS — Z955 Presence of coronary angioplasty implant and graft: Secondary | ICD-10-CM

## 2017-10-07 ENCOUNTER — Encounter (HOSPITAL_COMMUNITY)
Admission: RE | Admit: 2017-10-07 | Discharge: 2017-10-07 | Disposition: A | Discharge status: Home / Self Care | Payer: PPO | Source: Ambulatory Visit | Attending: Cardiology | Admitting: Cardiology

## 2017-10-07 DIAGNOSIS — Z955 Presence of coronary angioplasty implant and graft: Secondary | ICD-10-CM

## 2017-10-07 DIAGNOSIS — I214 Non-ST elevation (NSTEMI) myocardial infarction: Secondary | ICD-10-CM

## 2017-10-10 ENCOUNTER — Encounter (HOSPITAL_COMMUNITY)
Admission: RE | Admit: 2017-10-10 | Discharge: 2017-10-10 | Disposition: A | Discharge status: Home / Self Care | Payer: PPO | Source: Ambulatory Visit | Attending: Cardiology | Admitting: Cardiology

## 2017-10-10 DIAGNOSIS — I214 Non-ST elevation (NSTEMI) myocardial infarction: Secondary | ICD-10-CM

## 2017-10-10 DIAGNOSIS — Z955 Presence of coronary angioplasty implant and graft: Secondary | ICD-10-CM

## 2017-10-12 ENCOUNTER — Encounter (HOSPITAL_COMMUNITY)
Admission: RE | Admit: 2017-10-12 | Discharge: 2017-10-12 | Disposition: A | Discharge status: Home / Self Care | Payer: PPO | Source: Ambulatory Visit | Attending: Cardiology | Admitting: Cardiology

## 2017-10-12 DIAGNOSIS — I214 Non-ST elevation (NSTEMI) myocardial infarction: Secondary | ICD-10-CM | POA: Diagnosis not present

## 2017-10-12 DIAGNOSIS — Z955 Presence of coronary angioplasty implant and graft: Secondary | ICD-10-CM

## 2017-10-13 ENCOUNTER — Encounter (HOSPITAL_COMMUNITY): Payer: Self-pay

## 2017-10-13 NOTE — Progress Notes (Signed)
Cardiac Individual Treatment Plan  Patient Details  Name: Justin [name] MRN: [id number] Date of Birth: 12/22/1928 Referring Provider:     CARDIAC REHAB PHASE II ORIENTATION from 08/04/2017 in [facility]  Referring Provider  Kirk Ruths MD      Initial Encounter Date:    CARDIAC REHAB PHASE II ORIENTATION from 08/04/2017 in [facility]  Date  08/04/17  Referring Provider  Kirk Ruths MD      Visit Diagnosis: Status post coronary artery stent placement 06/13/17 DES SVG OM  NSTEMI (non-ST elevated myocardial infarction) ([place]) 06/10/17  Patient's Home Medications on Admission:  Current Outpatient Medications:  .  acetaminophen (TYLENOL) 500 MG tablet, Take 500 mg by mouth daily as needed for moderate pain., Disp: , Rfl:  .  amiodarone (PACERONE) 200 MG tablet, Take 200 mg by mouth daily., Disp: , Rfl:  .  amLODipine (NORVASC) 2.5 MG tablet, Take 1 tablet (2.5 mg total) by mouth daily., Disp: 180 tablet, Rfl: 3 .  apixaban (ELIQUIS) 5 MG TABS tablet, Take 1 tablet (5 mg total) by mouth 2 (two) times daily., Disp: 60 tablet, Rfl: 11 .  beta carotene w/minerals (OCUVITE) tablet, Take 1 tablet by mouth daily., Disp: , Rfl:  .  carbidopa-levodopa (SINEMET IR) 25-100 MG tablet, Take 1.5 tablets by mouth 3 (three) times daily., Disp: 150 tablet, Rfl: 4 .  Carboxymethylcellulose Sodium (THERATEARS OP), Apply 1 drop to eye daily as needed (dry eyes)., Disp: , Rfl:  .  clopidogrel (PLAVIX) 75 MG tablet, Take 1 tablet (75 mg total) by mouth daily with breakfast., Disp: 90 tablet, Rfl: 3 .  docusate sodium (COLACE) 100 MG capsule, Take 100 mg by mouth every morning. , Disp: , Rfl:  .  ferrous sulfate 325 (65 FE) MG tablet, Take 325 mg by mouth daily with breakfast. , Disp: , Rfl:  .  finasteride (PROSCAR) 5 MG tablet, Take 5 mg by mouth every morning. , Disp: , Rfl:  .  levothyroxine (SYNTHROID) 50 MCG tablet, Take 1 tablet (50  mcg total) by mouth daily before breakfast., Disp: 90 tablet, Rfl: 3 .  lovastatin (MEVACOR) 20 MG tablet, Take 1 tablet (20 mg total) by mouth daily with breakfast. (Patient taking differently: Take 20 mg by mouth daily at 6 PM. ), Disp: 90 tablet, Rfl: 3 .  meclizine (ANTIVERT) 25 MG tablet, Take 1 tablet (25 mg total) by mouth 3 (three) times daily as needed for dizziness., Disp: 30 tablet, Rfl: 0 .  Multiple Vitamin (MULTIVITAMIN WITH MINERALS) TABS tablet, Take 1 tablet by mouth daily., Disp: , Rfl:  .  nitroGLYCERIN (NITROSTAT) 0.4 MG SL tablet, Place 1 tablet (0.4 mg total) under the tongue every 5 (five) minutes as needed for chest pain., Disp: 25 tablet, Rfl: 1 .  Probiotic Product (PROBIOTIC DAILY PO), Take 1 capsule by mouth daily., Disp: , Rfl:   Past Medical History: Past Medical History:  Diagnosis Date  . Basal cell carcinoma   . BPH (benign prostatic hypertrophy)   . CAD (coronary artery disease) 1991   a. H/o MI w/ PTCA;  b. s/p CABG by Dr Roxan Hockey w/ LIMA-LAD, SVG-OM1-OM3, SVG-D1, SVG-PDA  . CHF (congestive heart failure) ([place])   . Complication of anesthesia    Wife reports history of confusion after last surgery in 10/2016-lasted a few days  . HTN (hypertension)   . Hyperlipidemia   . Internal hemorrhoids   . Moderate aortic stenosis  a. 08/2014 Echo: EF 55-60%, no rwma, Gr 1 DD, mod AS, mildly dil asc Ao, mild MR, sev dil LA.  . NSTEMI (non-ST elevated myocardial infarction) ([place]) 05/2017    PCI with DES to SVG-OM1. SVG-OM3 occluded, LIMA-LAD and SVG-RCA patent  . PAF (paroxysmal atrial fibrillation) ([place])    a. Dx 08/2014;  b. CHA2DS2VASc = 4 - decision made to withold [place] 2/2 unsteady gait and syncope.  . Sigmoid diverticulitis   . Syncope    a. 08/2014 - ? orthostatic    Tobacco Use: Social History   Tobacco Use  Smoking Status Former Smoker  . Packs/day: 0.50  . Years: 15.00  . Pack years: 7.50  . Last attempt to quit: 06/29/1971  . Years since  quitting: 46.3  Smokeless Tobacco Never Used    Labs: Recent Chemical engineer    Labs for ITP Cardiac and Pulmonary Rehab Latest Ref Rng & Units 02/13/2014 02/28/2015 01/19/2016 05/03/2016 06/11/2017   Cholestrol 0 - 200 mg/dL 167 170 178 - 154   LDLCALC 0 - 99 mg/dL 73 64 60 - 70   HDL >40 mg/dL 80.60 96 102 - 76   Trlycerides <150 mg/dL 65.0 48 81 - 39   Hemoglobin A1c 4.6 - 6.5 % - - - 5.2 -      Capillary Blood Glucose: Lab Results  Component Value Date   GLUCAP 146 (H) 11/24/2016     Exercise Target Goals:    Exercise Program Goal: Individual exercise prescription set using results from initial 6 min walk test and THRR while considering  patient's activity barriers and safety.   Exercise Prescription Goal: Initial exercise prescription builds to 30-45 minutes a day of aerobic activity, 2-3 days per week.  Home exercise guidelines will be given to patient during program as part of exercise prescription that the participant will acknowledge.  Activity Barriers & Risk Stratification: Activity Barriers & Cardiac Risk Stratification - 08/04/17 1459      Activity Barriers & Cardiac Risk Stratification   Activity Barriers  Deconditioning;Other (comment);Balance Concerns;History of Falls    Comments  Left Partial Hip Replacement, Bilateral Shoulder Surgeries     Cardiac Risk Stratification  High       6 Minute Walk: 6 Minute Walk    Row Name 08/04/17 1449         6 Minute Walk   Phase  Initial     Distance  955 feet     Walk Time  6 minutes     # of Rest Breaks  1     MPH  1.81     METS  1.62     RPE  13     Perceived Dyspnea   1     VO2 Peak  5.69     Symptoms  Yes (comment)     Comments  Mild SOB +1, Muscle weakness "tired", pt took 1 rest break for a total of 65 seconds.      Resting HR  67 bpm     Resting BP  132/84     Resting Oxygen Saturation   98 %     Exercise Oxygen Saturation  during 6 min walk  98 %     Max Ex. HR  96 bpm     Max Ex. BP  142/68      2 Minute Post BP  122/60        Oxygen Initial Assessment:   Oxygen Re-Evaluation:   Oxygen Discharge (Final  Oxygen Re-Evaluation):   Initial Exercise Prescription: Initial Exercise Prescription - 08/04/17 1500      Date of Initial Exercise RX and Referring Provider   Date  08/04/17    Referring Provider  Kirk Ruths MD      NuStep   Level  1    SPM  70    Minutes  20    METs  1.3      Arm Ergometer   Level  1    Watts  10    Minutes  10    METs  1.4      Prescription Details   Frequency (times per week)  3x    Duration  Progress to 30 minutes of continuous aerobic without signs/symptoms of physical distress      Intensity   THRR 40-80% of Max Heartrate  53-106    Ratings of Perceived Exertion  11-13    Perceived Dyspnea  0-4      Progression   Progression  Continue progressive overload as per policy without signs/symptoms or physical distress.      Resistance Training   Training Prescription  Yes    Weight  2lbs    Reps  10-15       Perform Capillary Blood Glucose checks as needed.  Exercise Prescription Changes: Exercise Prescription Changes    Row Name 08/08/17 1600 08/17/17 1600 08/31/17 1602 09/14/17 1700 09/26/17 1042     Response to Exercise   Blood Pressure (Admit)  138/80  122/78  120/60  122/60  108/70   Blood Pressure (Exercise)  140/82  124/60  136/80  154/68  142/70   Blood Pressure (Exit)  116/80  118/62  114/70  124/60  112/60   Heart Rate (Admit)  64 bpm  60 bpm  69 bpm  57 bpm  72 bpm   Heart Rate (Exercise)  86 bpm  83 bpm  88 bpm  91 bpm  88 bpm   Heart Rate (Exit)  71 bpm  64 bpm  68 bpm  60 bpm  77 bpm   Rating of Perceived Exertion (Exercise)  12  12  11  12  12    Perceived Dyspnea (Exercise)  0  -  -  -  -   Symptoms  None   -  None  None  None   Comments  Pt oriented to exercise equipement   -  -  -  -   Duration  Progress to 30 minutes of  aerobic without signs/symptoms of physical distress  Progress to 30 minutes  of  aerobic without signs/symptoms of physical distress  Continue with 30 min of aerobic exercise without signs/symptoms of physical distress.  Continue with 30 min of aerobic exercise without signs/symptoms of physical distress.  Continue with 30 min of aerobic exercise without signs/symptoms of physical distress.   Intensity  THRR New  THRR unchanged  THRR unchanged  THRR unchanged  THRR unchanged     Progression   Progression  Continue to progress workloads to maintain intensity without signs/symptoms of physical distress.  Continue to progress workloads to maintain intensity without signs/symptoms of physical distress.  Continue to progress workloads to maintain intensity without signs/symptoms of physical distress.  Continue to progress workloads to maintain intensity without signs/symptoms of physical distress.  Continue to progress workloads to maintain intensity without signs/symptoms of physical distress.   Average METs  2.3  3.4  3.9  4.6  3.9     Resistance Training  Training Prescription  Yes  No  No  Yes  Yes   Weight   2lbs  -  -  2lbs  2lbs   Reps  10-15  -  -  10-15  10-15   Time  10 Minutes  -  -  10 Minutes  10 Minutes     Interval Training   Interval Training  No  No  No  No  No     NuStep   Level  1  3  3  4  4    SPM  75  80  80  85  85   Minutes  20  30  30  30  20    METs  2.8  3.4  3.9  4.6  3.9     Arm Ergometer   Level  1  -  -  -  4   Watts  10  -  -  -  -   Minutes  10  -  -  -  10   METs  1.81  -  -  -  2     Home Exercise Plan   Plans to continue exercise at  -  -  -  Home (comment) Walking, Nustep, Chair Exercises  Home (comment) Walking, Nustep, Chair Exercises   Frequency  -  -  -  Add 2 additional days to program exercise sessions.  Add 2 additional days to program exercise sessions.   Initial Home Exercises Provided  -  -  -  09/07/17  09/07/17   Row Name 10/10/17 0735             Response to Exercise   Blood Pressure (Admit)  112/70        Blood Pressure (Exercise)  148/72       Blood Pressure (Exit)  114/72       Heart Rate (Admit)  66 bpm       Heart Rate (Exercise)  86 bpm       Heart Rate (Exit)  72 bpm       Rating of Perceived Exertion (Exercise)  11       Perceived Dyspnea (Exercise)  0       Symptoms  None       Duration  Continue with 30 min of aerobic exercise without signs/symptoms of physical distress.       Intensity  THRR unchanged         Progression   Progression  Continue to progress workloads to maintain intensity without signs/symptoms of physical distress.       Average METs  4.02         Resistance Training   Training Prescription  Yes       Weight  2lbs       Reps  10-15       Time  10 Minutes         Interval Training   Interval Training  No         NuStep   Level  4       SPM  85       Minutes  20       METs  3.8         Arm Ergometer   Level  4       Watts  40       Minutes  10       METs  4.23  Home Exercise Plan   Plans to continue exercise at  Home (comment)       Frequency  Add 2 additional days to program exercise sessions. Walking, Chair Exercises, Nustep       Initial Home Exercises Provided  09/07/17          Exercise Comments: Exercise Comments    Row Name 08/08/17 1630 08/17/17 1613 09/07/17 1434 09/14/17 1717 10/13/17 0738   Exercise Comments  Pt oriented to exercise equipment today. Pt off to a good start with cardiac rehab.   Pt is responding well to exercise prescription. Pt able to exercise for 30 minutes with minimal difficulty. Will continue to progress and monitor pt.   Reviewed HEP with pt. Pt has access to Nustep at home. Pt also walks, but was reminded to take extra steps to avoid falling, pt is a high fall risk. Will continue to monitor and follow up with pt about home exercise program.   Pt is responding well to exercise prescription. Pt is very motivated to exercise. Will continue to progress pt as tolerated and monitor.   Pt is doing well with  exercise. Pt puts forth great effort while in rehab and enjoys the education classes. Will continue to monitor and progress pt.       Exercise Goals and Review: Exercise Goals    Row Name 08/04/17 1456             Exercise Goals   Increase Physical Activity  Yes       Intervention  Develop an individualized exercise prescription for aerobic and resistive training based on initial evaluation findings, risk stratification, comorbidities and participant's personal goals.;Provide advice, education, support and counseling about physical activity/exercise needs.       Expected Outcomes  Short Term: Attend rehab on a regular basis to increase amount of physical activity.;Long Term: Add in home exercise to make exercise part of routine and to increase amount of physical activity.;Long Term: Exercising regularly at least 3-5 days a week.       Increase Strength and Stamina  Yes       Intervention  Provide advice, education, support and counseling about physical activity/exercise needs.;Develop an individualized exercise prescription for aerobic and resistive training based on initial evaluation findings, risk stratification, comorbidities and participant's personal goals.       Expected Outcomes  Short Term: Increase workloads from initial exercise prescription for resistance, speed, and METs.;Short Term: Perform resistance training exercises routinely during rehab and add in resistance training at home;Long Term: Improve cardiorespiratory fitness, muscular endurance and strength as measured by increased METs and functional capacity (6MWT)       Able to understand and use rate of perceived exertion (RPE) scale  Yes       Intervention  Provide education and explanation on how to use RPE scale       Expected Outcomes  Short Term: Able to use RPE daily in rehab to express subjective intensity level;Long Term:  Able to use RPE to guide intensity level when exercising independently       Knowledge and  understanding of Target Heart Rate Range (THRR)  Yes       Intervention  Provide education and explanation of THRR including how the numbers were predicted and where they are located for reference       Expected Outcomes  Short Term: Able to state/look up THRR;Short Term: Able to use daily as guideline for intensity in rehab;Long Term: Able to use  THRR to govern intensity when exercising independently       Able to check pulse independently  Yes       Intervention  Provide education and demonstration on how to check pulse in carotid and radial arteries.;Review the importance of being able to check your own pulse for safety during independent exercise       Expected Outcomes  Short Term: Able to explain why pulse checking is important during independent exercise;Long Term: Able to check pulse independently and accurately       Understanding of Exercise Prescription  Yes       Intervention  Provide education, explanation, and written materials on patient's individual exercise prescription       Expected Outcomes  Short Term: Able to explain program exercise prescription;Long Term: Able to explain home exercise prescription to exercise independently          Exercise Goals Re-Evaluation : Exercise Goals Re-Evaluation    Row Name 08/17/17 1613 09/07/17 1438 09/14/17 1722 10/13/17 0740       Exercise Goal Re-Evaluation   Exercise Goals Review  Understanding of Exercise Prescription;Knowledge and understanding of Target Heart Rate Range (THRR);Increase Physical Activity;Able to understand and use rate of perceived exertion (RPE) scale;Increase Strength and Stamina;Able to check pulse independently  Understanding of Exercise Prescription;Knowledge and understanding of Target Heart Rate Range (THRR);Able to understand and use rate of perceived exertion (RPE) scale;Increase Physical Activity;Increase Strength and Stamina  Understanding of Exercise Prescription;Increase Strength and Stamina;Increase Physical  Activity  Understanding of Exercise Prescription;Increase Strength and Stamina;Increase Physical Activity    Comments  Pt is tolerating exercise very well. Will follow up with pt on plans for exercising at home.   Reviewed HEP with pt, including weather precuations, RPE scale, THRR, NTG use, endpoints of exercise, warmup and cool down.  Pt is making great improvements with exercise in rehab. Pt is now able to do standing stretches more independently due to increased stamina. Will continue to monitor and progress pt.   Pt is still doing standing stretches independently. Pt is still exercising at home once a week using a Nustep for 20 minutes. Pt also goes for walk.     Expected Outcomes  Will continue to increase workloads as tolerated by pt. Will continue to build stamnia.   Pt will exercise 2 addiitonal days at home using the Nustep, walking and chair exericses.  Pt will continue to exercise 2 days at home fitness center. Pt will continue to improve cardiovascular endurance and balance.   Pt will continue to exercise 1-2 days a week. Pt will work to gradually increase total exercise time at home to 30 minutes. Pt will continue to improve functinal mobility, balance and cardiovascular strength.         Discharge Exercise Prescription (Final Exercise Prescription Changes): Exercise Prescription Changes - 10/10/17 0735      Response to Exercise   Blood Pressure (Admit)  112/70    Blood Pressure (Exercise)  148/72    Blood Pressure (Exit)  114/72    Heart Rate (Admit)  66 bpm    Heart Rate (Exercise)  86 bpm    Heart Rate (Exit)  72 bpm    Rating of Perceived Exertion (Exercise)  11    Perceived Dyspnea (Exercise)  0    Symptoms  None    Duration  Continue with 30 min of aerobic exercise without signs/symptoms of physical distress.    Intensity  THRR unchanged      Progression  Progression  Continue to progress workloads to maintain intensity without signs/symptoms of physical distress.     Average METs  4.02      Resistance Training   Training Prescription  Yes    Weight  2lbs    Reps  10-15    Time  10 Minutes      Interval Training   Interval Training  No      NuStep   Level  4    SPM  85    Minutes  20    METs  3.8      Arm Ergometer   Level  4    Watts  40    Minutes  10    METs  4.23      Home Exercise Plan   Plans to continue exercise at  Home (comment)    Frequency  Add 2 additional days to program exercise sessions. Walking, Chair Exercises, Nustep    Initial Home Exercises Provided  09/07/17       Nutrition:  Target Goals: Understanding of nutrition guidelines, daily intake of sodium 1500mg , cholesterol 200mg , calories 30% from fat and 7% or less from saturated fats, daily to have 5 or more servings of fruits and vegetables.  Biometrics: Pre Biometrics - 08/04/17 1456      Pre Biometrics   Height  5\' 5"  (1.651 m)    Weight  143 lb 11.8 oz (65.2 kg)    Waist Circumference  36.5 inches    Hip Circumference  39.5 inches    Waist to Hip Ratio  0.92 %    BMI (Calculated)  23.92    Triceps Skinfold  23 mm    % Body Fat  27.2 %    Grip Strength  36 kg    Flexibility  11.5 in    Single Leg Stand  0 seconds        Nutrition Therapy Plan and Nutrition Goals: Nutrition Therapy & Goals - 08/04/17 1331      Nutrition Therapy   Diet  Heart Healthy      Intervention Plan   Intervention  Prescribe, educate and counsel regarding individualized specific dietary modifications aiming towards targeted core components such as weight, hypertension, lipid management, diabetes, heart failure and other comorbidities.    Expected Outcomes  Short Term Goal: Understand basic principles of dietary content, such as calories, fat, sodium, cholesterol and nutrients.;Long Term Goal: Adherence to prescribed nutrition plan.       Nutrition Assessments: Nutrition Assessments - 08/04/17 1417      MEDFICTS Scores   Pre Score  121       Nutrition Goals  Re-Evaluation:   Nutrition Goals Re-Evaluation:   Nutrition Goals Discharge (Final Nutrition Goals Re-Evaluation):   Psychosocial: Target Goals: Acknowledge presence or absence of significant depression and/or stress, maximize coping skills, provide positive support system. Participant is able to verbalize types and ability to use techniques and skills needed for reducing stress and depression.  Initial Review & Psychosocial Screening: Initial Psych Review & Screening - 08/04/17 1625      Initial Review   Current issues with  None Identified      Family Dynamics   Good Support System?  Yes [name] has his wife for support.      Barriers   Psychosocial barriers to participate in program  There are no identifiable barriers or psychosocial needs.      Screening Interventions   Interventions  Encouraged to exercise    Expected  Outcomes  Long Term Goal: Stressors or current issues are controlled or eliminated.;Short Term goal: Identification and review with participant of any Quality of Life or Depression concerns found by scoring the questionnaire.;Long Term goal: The participant improves quality of Life and PHQ9 Scores as seen by post scores and/or verbalization of changes;Short Term goal: Utilizing psychosocial counselor, staff and physician to assist with identification of specific Stressors or current issues interfering with healing process. Setting desired goal for each stressor or current issue identified.       Quality of Life Scores: Quality of Life - 08/04/17 1458      Quality of Life Scores   Health/Function Pre  13 %    Socioeconomic Pre  26.42 %    Psych/Spiritual Pre  21.43 %    Family Pre  24.7 %    GLOBAL Pre  19 %      Scores of 19 and below usually indicate a poorer quality of life in these areas.  A difference of  2-3 points is a clinically meaningful difference.  A difference of 2-3 points in the total score of the Quality of Life Index has been associated with  significant improvement in overall quality of life, self-image, physical symptoms, and general health in studies assessing change in quality of life.  PHQ-9: Recent Review Flowsheet Data    Depression screen [facility] 2/9 08/08/2017 07/01/2016 03/15/2016 02/28/2015   Decreased Interest 0 1 0 0   Down, Depressed, Hopeless 0 3 0 0   PHQ - 2 Score 0 4 0 0   Altered sleeping - 1 - -   Tired, decreased energy - 3 - -   Change in appetite - 1 - -   Feeling bad or failure about yourself  - 1 - -   Trouble concentrating - 0 - -   Moving slowly or fidgety/restless - 0 - -   Suicidal thoughts - 0 - -   PHQ-9 Score - 10 - -   Difficult doing work/chores - Somewhat difficult - -     Interpretation of Total Score  Total Score Depression Severity:  1-4 = Minimal depression, 5-9 = Mild depression, 10-14 = Moderate depression, 15-19 = Moderately severe depression, 20-27 = Severe depression   Psychosocial Evaluation and Intervention:   Psychosocial Re-Evaluation: Psychosocial Re-Evaluation    Row Name 08/18/17 1110 09/15/17 1317 10/13/17 0742         Psychosocial Re-Evaluation   Current issues with  Current Stress Concerns  Current Stress Concerns  None Identified     Comments  -  -  No psychosocial needs identified. No intervention necessary.      Expected Outcomes  -  -  Pt will continue to exhibit a positve outlook with good coping skills.      Interventions  Stress management education;Encouraged to attend Cardiac Rehabilitation for the exercise  Stress management education;Encouraged to attend Cardiac Rehabilitation for the exercise  Stress management education;Encouraged to attend Cardiac Rehabilitation for the exercise     Continue Psychosocial Services   No Follow up required  No Follow up required  No Follow up required     Comments  [name] will continue to participate in phase 2 cardiac rehab for stress reduction.  [name] will continue to participate in phase 2 cardiac rehab for stress reduction.  -        Initial Review   Source of Stress Concerns  Chronic Illness  Chronic Illness  -  Psychosocial Discharge (Final Psychosocial Re-Evaluation): Psychosocial Re-Evaluation - 10/13/17 6767      Psychosocial Re-Evaluation   Current issues with  None Identified    Comments  No psychosocial needs identified. No intervention necessary.     Expected Outcomes  Pt will continue to exhibit a positve outlook with good coping skills.     Interventions  Stress management education;Encouraged to attend Cardiac Rehabilitation for the exercise    Continue Psychosocial Services   No Follow up required       Vocational Rehabilitation: Provide vocational rehab assistance to qualifying candidates.   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 Participant will state understanding/return demonstration of topics presented.  Learning Barriers/Preferences: Learning Barriers/Preferences - 08/04/17 1415      Learning Barriers/Preferences   Learning Barriers  Sight    Learning Preferences  Written Material;Skilled Demonstration;Individual Instruction       Education Topics: Count Your Pulse:  -Group instruction provided by verbal instruction, demonstration, patient participation and written materials to support subject.  Instructors address importance of being able to find your pulse and how to count your pulse when at home without a heart monitor.  Patients get hands on experience counting their pulse with staff help and individually.   CARDIAC REHAB PHASE II EXERCISE from 10/12/2017 in [place]  Date  10/07/17  Instruction Review  Code  1- Verbalizes Understanding      Heart Attack, Angina, and Risk Factor Modification:  -Group instruction provided by verbal instruction, video, and written materials to support subject.  Instructors address signs and symptoms of angina and heart attacks.    Also discuss risk factors for heart disease and how to make changes to improve heart health risk factors.   CARDIAC REHAB PHASE II EXERCISE from 10/12/2017 in [place]  Date  08/24/17  Educator  RN  Instruction Review Code  2- Demonstrated Understanding      Functional Fitness:  -Group instruction provided by verbal instruction, demonstration, patient participation, and written materials to support subject.  Instructors address safety measures for doing things around the house.  Discuss how to get up and down off the floor, how to pick things up properly, how to safely get out of a chair without assistance, and balance training.   CARDIAC REHAB PHASE II EXERCISE from 10/12/2017 in [place]  Date  09/23/17  Instruction Review Code  2- Demonstrated Understanding      Meditation and Mindfulness:  -Group instruction provided by verbal instruction, patient participation, and written materials to support subject.  Instructor addresses importance of mindfulness and meditation practice to help reduce stress and improve awareness.  Instructor also leads participants through a meditation exercise.    CARDIAC REHAB PHASE II EXERCISE from 10/12/2017 in [place]  Date  09/14/17  Educator  Jeanella Craze  Instruction Review Code  2- Demonstrated Understanding      Stretching for Flexibility and Mobility:  -Group instruction provided by verbal instruction, patient participation, and written materials to support subject.  Instructors lead participants through series of stretches that are designed  to increase flexibility thus improving mobility.   These stretches are additional exercise for major muscle groups that are typically performed during regular warm up and cool down.   Hands Only CPR:  -Group verbal, video, and participation provides a basic overview of AHA guidelines for community CPR. Role-play of emergencies allow participants the opportunity to practice calling for help and chest compression technique with discussion of AED use.   Hypertension: -Group verbal and written instruction that provides a basic overview of hypertension including the most recent diagnostic guidelines, risk factor reduction with self-care instructions and medication management.   CARDIAC REHAB PHASE II EXERCISE from 10/12/2017 in [place]  Date  10/12/17  Educator  Barnet Pall  Instruction Review Code  2- Demonstrated Understanding       Nutrition I class: Heart Healthy Eating:  -Group instruction provided by PowerPoint slides, verbal discussion, and written materials to support subject matter. The instructor gives an explanation and review of the Therapeutic Lifestyle Changes diet recommendations, which includes a discussion on lipid goals, dietary fat, sodium, fiber, plant stanol/sterol esters, sugar, and the components of a well-balanced, healthy diet.   Nutrition II class: Lifestyle Skills:  -Group instruction provided by PowerPoint slides, verbal discussion, and written materials to support subject matter. The instructor gives an explanation and review of label reading, grocery shopping for heart health, heart healthy recipe modifications, and ways to make healthier choices when eating out.   Diabetes Question & Answer:  -Group instruction provided by PowerPoint slides, verbal discussion, and written materials to support subject matter. The instructor gives an explanation and review of diabetes co-morbidities, pre- and post-prandial blood glucose goals, pre-exercise blood glucose goals, signs, symptoms, and  treatment of hypoglycemia and hyperglycemia, and foot care basics.   CARDIAC REHAB PHASE II EXERCISE from 10/12/2017 in [place]  Date  08/19/17  Educator  RD  Instruction Review Code  2- Demonstrated Understanding      Diabetes Blitz:  -Group instruction provided by PowerPoint slides, verbal discussion, and written materials to support subject matter. The instructor gives an explanation and review of the physiology behind type 1 and type 2 diabetes, diabetes medications and rational behind using different medications, pre- and post-prandial blood glucose recommendations and Hemoglobin A1c goals, diabetes diet, and exercise including blood glucose guidelines for exercising safely.    Portion Distortion:  -Group instruction provided by PowerPoint slides, verbal discussion, written materials, and food models to support subject matter. The instructor gives an explanation of serving size versus portion size, changes in portions sizes over the last 20 years, and what consists of a serving from each food group.   Stress Management:  -Group instruction provided by verbal instruction, video, and written materials to support subject matter.  Instructors review role of stress in heart disease and how to cope with stress positively.     CARDIAC REHAB PHASE II EXERCISE from 10/12/2017 in [place]  Date  09/28/17  Instruction Review Code  2- Demonstrated Understanding      Exercising on Your Own:  -Group instruction provided by verbal instruction, power point, and written materials to support subject.  Instructors discuss benefits of exercise, components of exercise, frequency and intensity of exercise, and end points for exercise.  Also discuss use of nitroglycerin and activating EMS.  Review options of places to exercise outside of rehab.  Review guidelines for sex with heart disease.   CARDIAC REHAB PHASE II EXERCISE  from 10/12/2017 in  [place]  Date  08/31/17  Instruction Review Code  2- Demonstrated Understanding      Cardiac Drugs I:  -Group instruction provided by verbal instruction and written materials to support subject.  Instructor reviews cardiac drug classes: antiplatelets, anticoagulants, beta blockers, and statins.  Instructor discusses reasons, side effects, and lifestyle considerations for each drug class.   Cardiac Drugs II:  -Group instruction provided by verbal instruction and written materials to support subject.  Instructor reviews cardiac drug classes: angiotensin converting enzyme inhibitors (ACE-I), angiotensin II receptor blockers (ARBs), nitrates, and calcium channel blockers.  Instructor discusses reasons, side effects, and lifestyle considerations for each drug class.   CARDIAC REHAB PHASE II EXERCISE from 10/12/2017 in [place]  Date  10/05/17  Instruction Review Code  2- Demonstrated Understanding      Anatomy and Physiology of the Circulatory System:  Group verbal and written instruction and models provide basic cardiac anatomy and physiology, with the coronary electrical and arterial systems. Review of: AMI, Angina, Valve disease, Heart Failure, Peripheral Artery Disease, Cardiac Arrhythmia, Pacemakers, and the ICD.   CARDIAC REHAB PHASE II EXERCISE from 10/12/2017 in [place]  Date  08/17/17  Educator  RN  Instruction Review Code  2- Demonstrated Understanding      Other Education:  -Group or individual verbal, written, or video instructions that support the educational goals of the cardiac rehab program.   Holiday Eating Survival Tips:  -Group instruction provided by PowerPoint slides, verbal discussion, and written materials to support subject matter. The instructor gives patients tips, tricks, and techniques to help them not only survive but enjoy the holidays despite the onslaught of  food that accompanies the holidays.   Knowledge Questionnaire Score: Knowledge Questionnaire Score - 08/04/17 1414      Knowledge Questionnaire Score   Pre Score  20/24       Core Components/Risk Factors/Patient Goals at Admission: Personal Goals and Risk Factors at Admission - 08/04/17 1448      Core Components/Risk Factors/Patient Goals on Admission    Weight Management  Yes;Weight Maintenance    Intervention  Weight Management: Develop a combined nutrition and exercise program designed to reach desired caloric intake, while maintaining appropriate intake of nutrient and fiber, sodium and fats, and appropriate energy expenditure required for the weight goal.;Weight Management: Provide education and appropriate resources to help participant work on and attain dietary goals.;Weight Management/Obesity: Establish reasonable short term and long term weight goals.    Admit Weight  143 lb 6.4 oz (65 kg)    Goal Weight: Short Term  140 lb (63.5 kg)    Goal Weight: Long Term  138 lb (62.6 kg)    Expected Outcomes  Short Term: Continue to assess and modify interventions until short term weight is achieved;Long Term: Adherence to nutrition and physical activity/exercise program aimed toward attainment of established weight goal;Weight Maintenance: Understanding of the daily nutrition guidelines, which includes 25-35% calories from fat, 7% or less cal from saturated fats, less than 200mg  cholesterol, less than 1.5gm of sodium, & 5 or more servings of fruits and vegetables daily;Understanding recommendations for meals to include 15-35% energy as protein, 25-35% energy from fat, 35-60% energy from carbohydrates, less than 200mg  of dietary cholesterol, 20-35 gm of total fiber daily;Understanding of distribution of calorie intake throughout the day with the consumption of 4-5 meals/snacks    Hypertension  Yes    Intervention  Monitor prescription use compliance.;Provide education on lifestyle modifcations  including regular physical activity/exercise, weight management, moderate sodium restriction and increased consumption of fresh fruit, vegetables, and low fat dairy, alcohol moderation, and smoking cessation.    Expected Outcomes  Short Term: Continued assessment and intervention until BP is < 140/13mm HG in hypertensive participants. < 130/30mm HG in hypertensive participants with diabetes, heart failure or chronic kidney disease.;Long Term: Maintenance of blood pressure at goal levels.    Lipids  Yes    Intervention  Provide education and support for participant on nutrition & aerobic/resistive exercise along with prescribed medications to achieve LDL 70mg , HDL >40mg .    Expected Outcomes  Short Term: Participant states understanding of desired cholesterol values and is compliant with medications prescribed. Participant is following exercise prescription and nutrition guidelines.;Long Term: Cholesterol controlled with medications as prescribed, with individualized exercise RX and with personalized nutrition plan. Value goals: LDL < 70mg , HDL > 40 mg.       Core Components/Risk Factors/Patient Goals Review:  Goals and Risk Factor Review    Row Name 08/18/17 1107 09/15/17 1225 10/13/17 0738         Core Components/Risk Factors/Patient Goals Review   Personal Goals Review  Weight Management/Obesity;Hypertension;Lipids  Weight Management/Obesity;Hypertension;Lipids  Weight Management/Obesity;Hypertension;Lipids     Review  [name]'s vital signs have been stable at cardiac rehab. [name] is off to a good start to exercise  [name]'s vital signs have been stable at cardiac rehab. [name] is doing well with exercsie and enjoys coming to class.  Pt with multiple CAD RF continues to enjoy CR Program.  [name] states that he is increasing his strength with exercise.      Expected Outcomes  [name] will continue to particpate in phase 2 cardiac rehab and take his medicaitons as presribed  [name] will continue to particpate in phase 2  cardiac rehab and take his medicaitons as presribed  [name] will continue to participate in CR exercise and lifestyle modification opportunities.         Core Components/Risk Factors/Patient Goals at Discharge (Final Review):  Goals and Risk Factor Review - 10/13/17 0738      Core Components/Risk Factors/Patient Goals Review   Personal Goals Review  Weight Management/Obesity;Hypertension;Lipids    Review  Pt with multiple CAD RF continues to enjoy CR Program.  [name] states that he is increasing his strength with exercise.     Expected Outcomes  [name] will continue to participate in CR exercise and lifestyle modification opportunities.        ITP Comments: ITP Comments    Row Name 08/04/17 1412 08/18/17 1111 09/15/17 1222 10/13/17 0736     ITP Comments  Dr. Fransico Him, Medical Director  30 day ITP review. [name] is of to a good start to exercise.  30 day ITP review.  Patient with good attendance and partcipation in phase 2 cardiac rehab  30 day ITP review. Pt demonstrates eagerness to participate in CR exercise and RF modification opportunities with great attendance.        Comments: See IT Comments.

## 2017-10-14 ENCOUNTER — Encounter (HOSPITAL_COMMUNITY)
Admission: RE | Admit: 2017-10-14 | Discharge: 2017-10-14 | Disposition: A | Discharge status: Home / Self Care | Payer: PPO | Source: Ambulatory Visit | Attending: Cardiology | Admitting: Cardiology

## 2017-10-14 DIAGNOSIS — I214 Non-ST elevation (NSTEMI) myocardial infarction: Secondary | ICD-10-CM

## 2017-10-14 DIAGNOSIS — Z955 Presence of coronary angioplasty implant and graft: Secondary | ICD-10-CM

## 2017-10-17 ENCOUNTER — Encounter (HOSPITAL_COMMUNITY)
Admission: RE | Admit: 2017-10-17 | Discharge: 2017-10-17 | Disposition: A | Discharge status: Home / Self Care | Payer: PPO | Source: Ambulatory Visit | Attending: Cardiology | Admitting: Cardiology

## 2017-10-17 DIAGNOSIS — Z955 Presence of coronary angioplasty implant and graft: Secondary | ICD-10-CM

## 2017-10-17 DIAGNOSIS — I214 Non-ST elevation (NSTEMI) myocardial infarction: Secondary | ICD-10-CM

## 2017-10-19 ENCOUNTER — Encounter: Payer: Self-pay | Admitting: Cardiology

## 2017-10-19 ENCOUNTER — Encounter (HOSPITAL_COMMUNITY)
Admission: RE | Admit: 2017-10-19 | Discharge: 2017-10-19 | Disposition: A | Discharge status: Home / Self Care | Payer: PPO | Source: Ambulatory Visit | Attending: Cardiology | Admitting: Cardiology

## 2017-10-19 DIAGNOSIS — I214 Non-ST elevation (NSTEMI) myocardial infarction: Secondary | ICD-10-CM

## 2017-10-19 DIAGNOSIS — Z955 Presence of coronary angioplasty implant and graft: Secondary | ICD-10-CM

## 2017-10-21 ENCOUNTER — Encounter (HOSPITAL_COMMUNITY): Admission: RE | Admit: 2017-10-21 | Payer: PPO | Source: Ambulatory Visit

## 2017-10-24 ENCOUNTER — Encounter (HOSPITAL_COMMUNITY)
Admission: RE | Admit: 2017-10-24 | Discharge: 2017-10-24 | Disposition: A | Discharge status: Home / Self Care | Payer: PPO | Source: Ambulatory Visit | Attending: Cardiology | Admitting: Cardiology

## 2017-10-24 VITALS — Ht 65.0 in | Wt 142.0 lb

## 2017-10-24 DIAGNOSIS — I214 Non-ST elevation (NSTEMI) myocardial infarction: Secondary | ICD-10-CM

## 2017-10-24 DIAGNOSIS — Z955 Presence of coronary angioplasty implant and graft: Secondary | ICD-10-CM

## 2017-10-24 DIAGNOSIS — R7989 Other specified abnormal findings of blood chemistry: Secondary | ICD-10-CM | POA: Diagnosis not present

## 2017-10-25 ENCOUNTER — Encounter: Payer: Self-pay | Admitting: Neurology

## 2017-10-25 ENCOUNTER — Telehealth: Payer: Self-pay | Admitting: *Deleted

## 2017-10-25 DIAGNOSIS — R7989 Other specified abnormal findings of blood chemistry: Secondary | ICD-10-CM

## 2017-10-25 LAB — T4, FREE: Free T4: 1.39 ng/dL (ref 0.82–1.77)

## 2017-10-25 LAB — TSH: TSH: 19.01 u[IU]/mL — AB (ref 0.450–4.500)

## 2017-10-25 MED ORDER — LEVOTHYROXINE SODIUM 75 MCG PO TABS: 75.0000 ug | ORAL_TABLET | Freq: Every day | ORAL | 3 refills | Status: DC

## 2017-10-25 NOTE — Telephone Encounter (Addendum)
-----   Message from Lelon Perla, MD sent at 10/25/2017  7:53 AM EDT ----- Change synthroid to 75 micrograms daily; TSH and free T4 12 weeks [place] with pt wife, aware of medication change. New script sent to the pharmacy and Lab orders mailed to the pt

## 2017-10-26 ENCOUNTER — Encounter (HOSPITAL_COMMUNITY)
Admission: RE | Admit: 2017-10-26 | Discharge: 2017-10-26 | Disposition: A | Discharge status: Home / Self Care | Payer: PPO | Source: Ambulatory Visit | Attending: Cardiology | Admitting: Cardiology

## 2017-10-26 DIAGNOSIS — I214 Non-ST elevation (NSTEMI) myocardial infarction: Secondary | ICD-10-CM | POA: Insufficient documentation

## 2017-10-26 DIAGNOSIS — Z955 Presence of coronary angioplasty implant and graft: Secondary | ICD-10-CM

## 2017-10-27 NOTE — Progress Notes (Signed)
HPI: FU CAD s/p CABG (2001) and atrial fibrillation. ABIs 7/17 showed vessels noncompressible. Had NSTEMI 12/18; cath showed occluded native vessels, 95 SVG to OM1 (PCI with DES), occluded SVG to OM2 and 3, patent SVG to RCA and patent LIMA to LAD. Echo 12/18 showed EF 40-45, moderate AS (mean gradient 21 mmHg), mild MR and mild LAE. Also on amiodarone for PAF. Metoprolol and cardizem DCed during previous ov due to bradycardia. Monitor 2/19 showed sinus with PAF, no pauses. Since last seen,  patient denies dyspnea, chest pain, palpitations or syncope.  He is unsteady but has not fallen recently.  Current Outpatient Medications  Medication Sig Dispense Refill  . acetaminophen (TYLENOL) 500 MG tablet Take 500 mg by mouth daily as needed for moderate pain.    [name] amiodarone (PACERONE) 200 MG tablet Take 200 mg by mouth daily.    [name] amLODipine (NORVASC) 2.5 MG tablet Take 1 tablet (2.5 mg total) by mouth daily. 180 tablet 3  . apixaban (ELIQUIS) 5 MG TABS tablet Take 1 tablet (5 mg total) by mouth 2 (two) times daily. 60 tablet 11  . beta carotene w/minerals (OCUVITE) tablet Take 1 tablet by mouth daily.    . carbidopa-levodopa (SINEMET IR) 25-100 MG tablet Take 1 tablet by mouth 3 (three) times daily.    . Carboxymethylcellulose Sodium (THERATEARS OP) Apply 1 drop to eye daily as needed (dry eyes).    . clopidogrel (PLAVIX) 75 MG tablet Take 1 tablet (75 mg total) by mouth daily with breakfast. 90 tablet 3  . docusate sodium (COLACE) 100 MG capsule Take 100 mg by mouth every morning.     . ferrous sulfate 325 (65 FE) MG tablet Take 325 mg by mouth daily with breakfast.     . finasteride (PROSCAR) 5 MG tablet Take 5 mg by mouth every morning.     [name] levothyroxine (SYNTHROID, LEVOTHROID) 75 MCG tablet Take 1 tablet (75 mcg total) by mouth daily before breakfast. 90 tablet 3  . lovastatin (MEVACOR) 20 MG tablet Take 1 tablet (20 mg total) by mouth daily with breakfast. (Patient taking differently:  Take 20 mg by mouth daily at 6 PM. ) 90 tablet 3  . meclizine (ANTIVERT) 25 MG tablet Take 1 tablet (25 mg total) by mouth 3 (three) times daily as needed for dizziness. 30 tablet 0  . Multiple Vitamin (MULTIVITAMIN WITH MINERALS) TABS tablet Take 1 tablet by mouth daily.    . nitroGLYCERIN (NITROSTAT) 0.4 MG SL tablet Place 1 tablet (0.4 mg total) under the tongue every 5 (five) minutes as needed for chest pain. 25 tablet 1  . Probiotic Product (PROBIOTIC DAILY PO) Take 1 capsule by mouth daily.     No current facility-administered medications for this visit.      Past Medical History:  Diagnosis Date  . Basal cell carcinoma   . BPH (benign prostatic hypertrophy)   . CAD (coronary artery disease) 1991   a. H/o MI w/ PTCA;  b. s/p CABG by Dr Roxan Hockey w/ LIMA-LAD, SVG-OM1-OM3, SVG-D1, SVG-PDA  . CHF (congestive heart failure) ([place])   . Complication of anesthesia    Wife reports history of confusion after last surgery in 10/2016-lasted a few days  . HTN (hypertension)   . Hyperlipidemia   . Internal hemorrhoids   . Moderate aortic stenosis    a. 08/2014 Echo: EF 55-60%, no rwma, Gr 1 DD, mod AS, mildly dil asc Ao, mild MR, sev dil LA.  . NSTEMI (  non-ST elevated myocardial infarction) ([place]) 05/2017    PCI with DES to SVG-OM1. SVG-OM3 occluded, LIMA-LAD and SVG-RCA patent  . PAF (paroxysmal atrial fibrillation) ([place])    a. Dx 08/2014;  b. CHA2DS2VASc = 4 - decision made to withold [place] 2/2 unsteady gait and syncope.  . Sigmoid diverticulitis   . Syncope    a. 08/2014 - ? orthostatic    Past Surgical History:  Procedure Laterality Date  . CARDIAC CATHETERIZATION    . COLONOSCOPY    . CORONARY ARTERY BYPASS GRAFT  2000   Dr Roxan Hockey  LIMA-LAD, SVG-OM1-OM3, SVG-D1, SVG-PDA.   [name] CORONARY STENT INTERVENTION N/A 06/13/2017   Procedure: CORONARY STENT INTERVENTION;  Surgeon: Lorretta Harp, MD;  Location: [facility];  Service: Cardiovascular;  Laterality: N/A;  . CYSTOSCOPY  N/A 12/07/2016   Procedure: CYSTOSCOPY;  Surgeon: Cleon Gustin, MD;  Location: [facility];  Service: Urology;  Laterality: N/A;  NEEDS 30 MIN TOTAL FOR SURGERY  . CYSTOSCOPY WITH URETHRAL DILATATION N/A 02/21/2017   Procedure: DILATATION OF BLADDER NECK WITH INJECTION OF MITOMYCIN C;  Surgeon: Cleon Gustin, MD;  Location: [facility];  Service: Urology;  Laterality: N/A;  . EYE SURGERY     bil cataract  . HIP ARTHROPLASTY Left 10/14/2015   Procedure: LEFT HIP HEMI ARTHROPLASTY;  Surgeon: [name], MD;  Location: [facility];  Service: Orthopedics;  Laterality: Left;  . LEFT HEART CATH AND CORS/GRAFTS ANGIOGRAPHY N/A 06/13/2017   Procedure: LEFT HEART CATH AND CORS/GRAFTS ANGIOGRAPHY;  Surgeon: Lorretta Harp, MD;  Location: [facility];  Service: Cardiovascular;  Laterality: N/A;  . SHOULDER SURGERY     x2  . TONSILLECTOMY    . TRANSTHORACIC ECHOCARDIOGRAM  10-06-2016   dr Stanford Breed   mild LVH, ef 40-45%, diffuse hypokinesis/ moderate AV calcification leaflets and annulus,  severe thickened leaflets, mod.-sev. aortic stenosis (Valva area 0.61cm^2, mean grandient 61mmHg, peak gradiant 55mmHg)/  moderate thickened , mild calcified MV leaflets without stenosis, moderate MR/  severe LAE/ trivial PR and TR  . TRANSURETHRAL RESECTION OF BLADDER NECK N/A 12/07/2016   Procedure: TRANSURETHRAL RESECTION OF BLADDER NECK;  Surgeon: Cleon Gustin, MD;  Location: [facility];  Service: Urology;  Laterality: N/A;  . XI ROBOTIC ASSISTED SIMPLE PROSTATECTOMY N/A 11/04/2016   Procedure: XI ROBOTIC ASSISTED SIMPLE PROSTATECTOMY;  Surgeon: Cleon Gustin, MD;  Location: [facility];  Service: Urology;  Laterality: N/A;    Social History   Socioeconomic History  . Marital status: Married    Spouse name: Not on file  . Number of children: 2  . Years of education: Not on file  . Highest education level: Not on file  Occupational History  . 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 Financial resource strain: Not very hard  . Food insecurity:    Worry: Never true    Inability: Never true  . Transportation needs:    Medical: No    Non-medical: No  Tobacco Use  . Smoking status: Former Smoker    Packs/day: 0.50    Years: 15.00    Pack years: 7.50    Last attempt to quit: 06/29/1971    Years since quitting: 46.3  . Smokeless tobacco: Never Used  Substance and Sexual Activity  . Alcohol use: Yes    Alcohol/week: 0.6 - 1.2 oz    Types: 1 - 2 Glasses of wine per week    Comment: alternates wine and liquor. 1/2 pint liquor/week  .  Drug use: No  . 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 Physical activity:    Days per week: 0 days    Minutes per session: 0 min  . Stress: Not at all  Relationships  . Social connections:    Talks on phone: Not on file    Gets together: Not on file    Attends religious service: Not on file    Active member of club or organization: Not on file    Attends meetings of clubs or organizations: Not on file    Relationship status: Not on file  . Intimate partner violence:    Fear of current or ex partner: Not on file    Emotionally abused: Not on file    Physically abused: Not on file    Forced sexual activity: Not on file  Other Topics Concern  . Not on file  Social History Narrative   Pt and wife have an Independent home at [facility], wants to move to an apartment soon.    Family History  Problem Relation Age of Onset  . Coronary artery disease Father   . Hyperlipidemia Father   . Heart attack Father   . Alcohol abuse Father   . Bipolar disorder Mother   . Heart disease Mother   . Alcohol abuse Brother   . Cancer Brother   . Cancer Maternal Grandfather        oral cancer, chew tobacco  . Cancer Sister   . Glaucoma Sister   . Alcohol abuse Unknown     ROS: no fevers or chills, productive cough, hemoptysis, dysphasia,  odynophagia, melena, hematochezia, dysuria, hematuria, rash, seizure activity, orthopnea, PND, pedal edema, claudication. Remaining systems are negative.  Physical Exam: Well-developed frail in no acute distress.  Skin is warm and dry.  HEENT is normal.  Neck is supple.  Chest is clear to auscultation with normal expansion.  Cardiovascular exam is regular rate and rhythm.  2/6 systolic murmur left sternal border. Abdominal exam nontender or distended. No masses palpated. Extremities show no edema. neuro grossly intact   A/P  1 paroxysmal atrial fibrillation-patient remains in sinus rhythm today.  We will continue with amiodarone.  Check chest x-ray.  Continue apixaban.  Check hemoglobin and renal function.  Patient is unsteady but has not fallen recently.  We may need to consider discontinuing anticoagulation in the future if he has more frequent falls.  2 moderate aortic stenosis-we will plan repeat echocardiogram December 2019.  3 coronary artery disease-continue Plavix and statin.  4 hypertension-blood pressure is controlled.  Continue present medications.  5 hyperlipidemia-continue statin.  6 peripheral vascular disease-continue statin.  7 hypothyroid-Synthroid increased recently.  Plan recheck laboratories 12 weeks.  Kirk Ruths, MD

## 2017-10-28 ENCOUNTER — Encounter (HOSPITAL_COMMUNITY)
Admission: RE | Admit: 2017-10-28 | Discharge: 2017-10-28 | Disposition: A | Discharge status: Home / Self Care | Payer: PPO | Source: Ambulatory Visit | Attending: Cardiology | Admitting: Cardiology

## 2017-10-28 DIAGNOSIS — I214 Non-ST elevation (NSTEMI) myocardial infarction: Secondary | ICD-10-CM

## 2017-10-28 DIAGNOSIS — Z955 Presence of coronary angioplasty implant and graft: Secondary | ICD-10-CM

## 2017-10-31 ENCOUNTER — Encounter (HOSPITAL_COMMUNITY)
Admission: RE | Admit: 2017-10-31 | Discharge: 2017-10-31 | Disposition: A | Discharge status: Home / Self Care | Payer: PPO | Source: Ambulatory Visit | Attending: Cardiology | Admitting: Cardiology

## 2017-10-31 ENCOUNTER — Encounter (HOSPITAL_COMMUNITY): Payer: Self-pay

## 2017-10-31 DIAGNOSIS — I214 Non-ST elevation (NSTEMI) myocardial infarction: Secondary | ICD-10-CM

## 2017-10-31 DIAGNOSIS — Z955 Presence of coronary angioplasty implant and graft: Secondary | ICD-10-CM

## 2017-11-02 ENCOUNTER — Encounter: Payer: Self-pay | Admitting: Cardiology

## 2017-11-02 ENCOUNTER — Ambulatory Visit (INDEPENDENT_AMBULATORY_CARE_PROVIDER_SITE_OTHER): Payer: PPO | Admitting: Cardiology

## 2017-11-02 VITALS — BP 127/80 | HR 74 | Ht 65.0 in | Wt 143.4 lb

## 2017-11-02 DIAGNOSIS — I350 Nonrheumatic aortic (valve) stenosis: Secondary | ICD-10-CM

## 2017-11-02 DIAGNOSIS — I2510 Atherosclerotic heart disease of native coronary artery without angina pectoris: Secondary | ICD-10-CM | POA: Diagnosis not present

## 2017-11-02 DIAGNOSIS — I480 Paroxysmal atrial fibrillation: Secondary | ICD-10-CM | POA: Diagnosis not present

## 2017-11-02 DIAGNOSIS — R7989 Other specified abnormal findings of blood chemistry: Secondary | ICD-10-CM

## 2017-11-02 NOTE — Patient Instructions (Signed)
Medication Instructions:   NO CHANGE  Labwork:  Your physician recommends that you return for lab work in: 12 WEEKS  Testing/Procedures:  A chest x-ray takes a picture of the organs and structures inside the chest, including the heart, lungs, and blood vessels. This test can show several things, including, whether the heart is enlarges; whether fluid is building up in the lungs; and whether pacemaker / defibrillator leads are still in place. IN 12 WEEKS AT [facility]  Follow-Up:  Your physician wants you to follow-up in: [place] will receive a reminder letter in the mail two months in advance. If you don't receive a letter, please call our office to schedule the follow-up appointment.   If you need a refill on your cardiac medications before your next appointment, please call your pharmacy.

## 2017-11-03 NOTE — Progress Notes (Signed)
Discharge Progress Report  Patient Details  Name: Justin [name] MRN: [id number] Date of Birth: August 22, 1928 Referring Provider:     CARDIAC REHAB PHASE II ORIENTATION from 08/04/2017 in [facility]  Referring Provider  Kirk Ruths MD       Number of Visits: 35  Reason for Discharge:  Patient reached a stable level of exercise. Patient has met program and personal goals.  Smoking History:  Social History   Tobacco Use  Smoking Status Former Smoker  . Packs/day: 0.50  . Years: 15.00  . Pack years: 7.50  . Last attempt to quit: 06/29/1971  . Years since quitting: 46.3  Smokeless Tobacco Never Used    Diagnosis:  Status post coronary artery stent placement 06/13/17 DES SVG OM  NSTEMI (non-ST elevated myocardial infarction) ([facility]) 06/10/17  ADL [facility]:   Initial Exercise Prescription: Initial Exercise Prescription - 08/04/17 1500      Date of Initial Exercise RX and Referring Provider   Date  08/04/17    Referring Provider  Kirk Ruths MD      NuStep   Level  1    SPM  70    Minutes  20    METs  1.3      Arm Ergometer   Level  1    Watts  10    Minutes  10    METs  1.4      Prescription Details   Frequency (times per week)  3x    Duration  Progress to 30 minutes of continuous aerobic without signs/symptoms of physical distress      Intensity   THRR 40-80% of Max Heartrate  53-106    Ratings of Perceived Exertion  11-13    Perceived Dyspnea  0-4      Progression   Progression  Continue progressive overload as per policy without signs/symptoms or physical distress.      Resistance Training   Training Prescription  Yes    Weight  2lbs    Reps  10-15       Discharge Exercise Prescription (Final Exercise Prescription Changes): Exercise Prescription Changes - 10/31/17 1450      Response to Exercise   Blood Pressure (Admit)  102/70    Blood Pressure (Exercise)  138/64    Blood Pressure (Exit)  120/62    Heart Rate  (Admit)  67 bpm    Heart Rate (Exercise)  83 bpm    Heart Rate (Exit)  49 bpm    Rating of Perceived Exertion (Exercise)  12    Perceived Dyspnea (Exercise)  0    Symptoms  None    Duration  Continue with 30 min of aerobic exercise without signs/symptoms of physical distress.    Intensity  THRR unchanged      Progression   Progression  Continue to progress workloads to maintain intensity without signs/symptoms of physical distress.    Average METs  4      Resistance Training   Training Prescription  Yes    Weight  2lbs    Reps  10-15    Time  10 Minutes      Interval Training   Interval Training  No      NuStep   Level  4    SPM  95    Minutes  30    METs  4      Home Exercise Plan   Plans to continue exercise at  Home (comment) Walking, Nustep,  Chair Exercises     Frequency  Add 2 additional days to program exercise sessions.    Initial Home Exercises Provided  09/07/17       Functional Capacity: 6 Minute Walk    Row Name 08/04/17 1449 10/24/17 1354 11/03/17 1525     6 Minute Walk   Phase  Initial  Discharge  -   Distance  955 feet  920 feet  -   Distance % Change  -  -3.66 %  -   Distance Feet Change  -  -35 ft  -   Walk Time  6 minutes  6 minutes  -   # of Rest Breaks  1  0  -   MPH  1.81  1.74  -   METS  1.62  1.15  -   RPE  13  13  -   Perceived Dyspnea   1  0  -   VO2 Peak  5.69  4.02  -   Symptoms  Yes (comment)  No  -   Comments  Mild SOB +1, Muscle weakness "tired", pt took 1 rest break for a total of 65 seconds.   -  no symptoms   Resting HR  67 bpm  66 bpm  -   Resting BP  132/84  100/66  -   Resting Oxygen Saturation   98 %  -  -   Exercise Oxygen Saturation  during 6 min walk  98 %  -  -   Max Ex. HR  96 bpm  78 bpm  -   Max Ex. BP  142/68  110/64  -   2 Minute Post BP  122/60  118/76  -      Psychological, QOL, Others - Outcomes: PHQ 2/9: Depression screen [facility] 2/9 10/31/2017 08/08/2017 07/01/2016 03/15/2016 02/28/2015  Decreased Interest 0 0 1 0  0  Down, Depressed, Hopeless 0 0 3 0 0  PHQ - 2 Score 0 0 4 0 0  Altered sleeping - - 1 - -  Tired, decreased energy - - 3 - -  Change in appetite - - 1 - -  Feeling bad or failure about yourself  - - 1 - -  Trouble concentrating - - 0 - -  Moving slowly or fidgety/restless - - 0 - -  Suicidal thoughts - - 0 - -  PHQ-9 Score - - 10 - -  Difficult doing work/chores - - Somewhat difficult - -    Quality of Life: Quality of Life - 10/26/17 1457      Quality of Life Scores   Health/Function Pre  13 %    Health/Function Post  13.07 %    Health/Function % Change  0.54 %    Socioeconomic Pre  26.42 %    Socioeconomic Post  21.25 %    Socioeconomic % Change   -19.57 %    Psych/Spiritual Pre  21.43 %    Psych/Spiritual Post  20.07 %    Psych/Spiritual % Change  -6.35 %    Family Pre  24.7 %    Family Post  25.4 %    Family % Change  2.83 %    GLOBAL Pre  19 %    GLOBAL Post  17.91 %    GLOBAL % Change  -5.74 %       Personal Goals: Goals established at orientation with interventions provided to work toward goal. Personal Goals and Risk Factors at Admission -  08/04/17 1448      Core Components/Risk Factors/Patient Goals on Admission    Weight Management  Yes;Weight Maintenance    Intervention  Weight Management: Develop a combined nutrition and exercise program designed to reach desired caloric intake, while maintaining appropriate intake of nutrient and fiber, sodium and fats, and appropriate energy expenditure required for the weight goal.;Weight Management: Provide education and appropriate resources to help participant work on and attain dietary goals.;Weight Management/Obesity: Establish reasonable short term and long term weight goals.    Admit Weight  143 lb 6.4 oz (65 kg)    Goal Weight: Short Term  140 lb (63.5 kg)    Goal Weight: Long Term  138 lb (62.6 kg)    Expected Outcomes  Short Term: Continue to assess and modify interventions until short term weight is  achieved;Long Term: Adherence to nutrition and physical activity/exercise program aimed toward attainment of established weight goal;Weight Maintenance: Understanding of the daily nutrition guidelines, which includes 25-35% calories from fat, 7% or less cal from saturated fats, less than 253m cholesterol, less than 1.5gm of sodium, & 5 or more servings of fruits and vegetables daily;Understanding recommendations for meals to include 15-35% energy as protein, 25-35% energy from fat, 35-60% energy from carbohydrates, less than 2052mof dietary cholesterol, 20-35 gm of total fiber daily;Understanding of distribution of calorie intake throughout the day with the consumption of 4-5 meals/snacks    Hypertension  Yes    Intervention  Monitor prescription use compliance.;Provide education on lifestyle modifcations including regular physical activity/exercise, weight management, moderate sodium restriction and increased consumption of fresh fruit, vegetables, and low fat dairy, alcohol moderation, and smoking cessation.    Expected Outcomes  Short Term: Continued assessment and intervention until BP is < 140/9079mG in hypertensive participants. < 130/7m65m in hypertensive participants with diabetes, heart failure or chronic kidney disease.;Long Term: Maintenance of blood pressure at goal levels.    Lipids  Yes    Intervention  Provide education and support for participant on nutrition & aerobic/resistive exercise along with prescribed medications to achieve LDL <70mg101mL >40mg.60mExpected Outcomes  Short Term: Participant states understanding of desired cholesterol values and is compliant with medications prescribed. Participant is following exercise prescription and nutrition guidelines.;Long Term: Cholesterol controlled with medications as prescribed, with individualized exercise RX and with personalized nutrition plan. Value goals: LDL < 70mg, 4m> 40 mg.        Personal Goals Discharge: Goals and Risk  Factor Review    Row Name 08/18/17 1107 09/15/17 1225 10/13/17 0738 10/31/17 1206       Core Components/Risk Factors/Patient Goals Review   Personal Goals Review  Weight Management/Obesity;Hypertension;Lipids  Weight Management/Obesity;Hypertension;Lipids  Weight Management/Obesity;Hypertension;Lipids  Weight Management/Obesity;Hypertension;Lipids    Review  Justin [name]'s vital signs have been stable at cardiac rehab. Justin [name] is Justin [name] to a good start to exercise  Justin [name]'s vital signs have been stable at cardiac rehab. Justin [name] is Justin Musselng well with exercsie and enjoys coming to class.  Pt with multiple CAD RF continues to enjoy CR Program.  Justin [name] staGershon [name] that he is increasing his strength with exercise.   Pt with multiple CAD RF has finished CR Program with 35 sessions.     Expected Outcomes  Justin [name] wilGershon Musselontinue to particpate in phase 2 cardiac rehab and take his medicaitons as presribed  Justin [name] wilGershon Musselontinue to particpate in phase 2 cardiac rehab and take his medicaitons as presribed  Justin [name] wilGershon Musselontinue to participate in CR exercise and  lifestyle modification opportunities.   Justin [name] will continue to exercise at a the gym at his living facility.  His wife will motivate and ensure that Justin [name] continues his exercise.        Exercise Goals and Review: Exercise Goals    Row Name 08/04/17 1456             Exercise Goals   Increase Physical Activity  Yes       Intervention  Develop an individualized exercise prescription for aerobic and resistive training based on initial evaluation findings, risk stratification, comorbidities and participant's personal goals.;Provide advice, education, support and counseling about physical activity/exercise needs.       Expected Outcomes  Short Term: Attend rehab on a regular basis to increase amount of physical activity.;Long Term: Add in home exercise to make exercise part of routine and to increase amount of physical activity.;Long Term: Exercising regularly at least 3-5 days a week.       Increase  Strength and Stamina  Yes       Intervention  Provide advice, education, support and counseling about physical activity/exercise needs.;Develop an individualized exercise prescription for aerobic and resistive training based on initial evaluation findings, risk stratification, comorbidities and participant's personal goals.       Expected Outcomes  Short Term: Increase workloads from initial exercise prescription for resistance, speed, and METs.;Short Term: Perform resistance training exercises routinely during rehab and add in resistance training at home;Long Term: Improve cardiorespiratory fitness, muscular endurance and strength as measured by increased METs and functional capacity (6MWT)       Able to understand and use rate of perceived exertion (RPE) scale  Yes       Intervention  Provide education and explanation on how to use RPE scale       Expected Outcomes  Short Term: Able to use RPE daily in rehab to express subjective intensity level;Long Term:  Able to use RPE to guide intensity level when exercising independently       Knowledge and understanding of Target Heart Rate Range (THRR)  Yes       Intervention  Provide education and explanation of THRR including how the numbers were predicted and where they are located for reference       Expected Outcomes  Short Term: Able to state/look up THRR;Short Term: Able to use daily as guideline for intensity in rehab;Long Term: Able to use THRR to govern intensity when exercising independently       Able to check pulse independently  Yes       Intervention  Provide education and demonstration on how to check pulse in carotid and radial arteries.;Review the importance of being able to check your own pulse for safety during independent exercise       Expected Outcomes  Short Term: Able to explain why pulse checking is important during independent exercise;Long Term: Able to check pulse independently and accurately       Understanding of Exercise  Prescription  Yes       Intervention  Provide education, explanation, and written materials on patient's individual exercise prescription       Expected Outcomes  Short Term: Able to explain program exercise prescription;Long Term: Able to explain home exercise prescription to exercise independently          Nutrition & Weight - Outcomes: Pre Biometrics - 08/04/17 1456      Pre Biometrics   Height  _0  (1.651 m)    Weight  143  lb 11.8 oz (65.2 kg)    Waist Circumference  36.5 inches    Hip Circumference  39.5 inches    Waist to Hip Ratio  0.92 %    BMI (Calculated)  23.92    Triceps Skinfold  23 mm    % Body Fat  27.2 %    Grip Strength  36 kg    Flexibility  11.5 in    Single Leg Stand  0 seconds      Post Biometrics - 10/24/17 1353       Post  Biometrics   Height  _0  (1.651 m)    Weight  141 lb 15.6 oz (64.4 kg)    Waist Circumference  35 inches    Hip Circumference  39 inches    Waist to Hip Ratio  0.9 %    BMI (Calculated)  23.63    Triceps Skinfold  20 mm    % Body Fat  25.8 %    Grip Strength  36 kg    Flexibility  12 in    Single Leg Stand  0 seconds       Nutrition: Nutrition Therapy & Goals - 08/04/17 1331      Nutrition Therapy   Diet  Heart Healthy      Intervention Plan   Intervention  Prescribe, educate and counsel regarding individualized specific dietary modifications aiming towards targeted core components such as weight, hypertension, lipid management, diabetes, heart failure and other comorbidities.    Expected Outcomes  Short Term Goal: Understand basic principles of dietary content, such as calories, fat, sodium, cholesterol and nutrients.;Long Term Goal: Adherence to prescribed nutrition plan.       Nutrition Discharge: Nutrition Assessments - 08/04/17 1417      MEDFICTS Scores   Pre Score  121       Education Questionnaire Score: Knowledge Questionnaire Score - 10/26/17 1457      Knowledge Questionnaire Score   Pre Score   20/24    Post Score  22/24       Goals reviewed with patient; copy given to patient.

## 2017-12-03 ENCOUNTER — Other Ambulatory Visit: Payer: Self-pay | Admitting: Cardiology

## 2017-12-08 DIAGNOSIS — N481 Balanitis: Secondary | ICD-10-CM | POA: Diagnosis not present

## 2017-12-08 DIAGNOSIS — L570 Actinic keratosis: Secondary | ICD-10-CM | POA: Diagnosis not present

## 2017-12-08 DIAGNOSIS — L738 Other specified follicular disorders: Secondary | ICD-10-CM | POA: Diagnosis not present

## 2018-01-11 DIAGNOSIS — S61511A Laceration without foreign body of right wrist, initial encounter: Secondary | ICD-10-CM | POA: Diagnosis not present

## 2018-01-11 DIAGNOSIS — Z5189 Encounter for other specified aftercare: Secondary | ICD-10-CM | POA: Diagnosis not present

## 2018-01-11 DIAGNOSIS — S61501A Unspecified open wound of right wrist, initial encounter: Secondary | ICD-10-CM | POA: Diagnosis not present

## 2018-01-12 DIAGNOSIS — S61501D Unspecified open wound of right wrist, subsequent encounter: Secondary | ICD-10-CM | POA: Diagnosis not present

## 2018-01-12 DIAGNOSIS — Z5189 Encounter for other specified aftercare: Secondary | ICD-10-CM | POA: Diagnosis not present

## 2018-01-12 DIAGNOSIS — S61511D Laceration without foreign body of right wrist, subsequent encounter: Secondary | ICD-10-CM | POA: Diagnosis not present

## 2018-01-13 DIAGNOSIS — S61501D Unspecified open wound of right wrist, subsequent encounter: Secondary | ICD-10-CM | POA: Diagnosis not present

## 2018-01-13 DIAGNOSIS — S61511D Laceration without foreign body of right wrist, subsequent encounter: Secondary | ICD-10-CM | POA: Diagnosis not present

## 2018-01-13 DIAGNOSIS — Z5189 Encounter for other specified aftercare: Secondary | ICD-10-CM | POA: Diagnosis not present

## 2018-01-16 DIAGNOSIS — S61511D Laceration without foreign body of right wrist, subsequent encounter: Secondary | ICD-10-CM | POA: Diagnosis not present

## 2018-01-16 DIAGNOSIS — S61501D Unspecified open wound of right wrist, subsequent encounter: Secondary | ICD-10-CM | POA: Diagnosis not present

## 2018-01-16 DIAGNOSIS — Z5189 Encounter for other specified aftercare: Secondary | ICD-10-CM | POA: Diagnosis not present

## 2018-01-17 DIAGNOSIS — Z5189 Encounter for other specified aftercare: Secondary | ICD-10-CM | POA: Diagnosis not present

## 2018-01-17 DIAGNOSIS — S61501D Unspecified open wound of right wrist, subsequent encounter: Secondary | ICD-10-CM | POA: Diagnosis not present

## 2018-01-17 DIAGNOSIS — S61511D Laceration without foreign body of right wrist, subsequent encounter: Secondary | ICD-10-CM | POA: Diagnosis not present

## 2018-01-18 DIAGNOSIS — S61501D Unspecified open wound of right wrist, subsequent encounter: Secondary | ICD-10-CM | POA: Diagnosis not present

## 2018-01-18 DIAGNOSIS — Z5189 Encounter for other specified aftercare: Secondary | ICD-10-CM | POA: Diagnosis not present

## 2018-01-18 DIAGNOSIS — I480 Paroxysmal atrial fibrillation: Secondary | ICD-10-CM | POA: Diagnosis not present

## 2018-01-18 DIAGNOSIS — R7989 Other specified abnormal findings of blood chemistry: Secondary | ICD-10-CM | POA: Diagnosis not present

## 2018-01-18 DIAGNOSIS — S61511D Laceration without foreign body of right wrist, subsequent encounter: Secondary | ICD-10-CM | POA: Diagnosis not present

## 2018-01-19 LAB — BASIC METABOLIC PANEL
BUN/Creatinine Ratio: 18 (ref 10–24)
BUN: 25 mg/dL (ref 8–27)
CALCIUM: 9.5 mg/dL (ref 8.6–10.2)
CO2: 24 mmol/L (ref 20–29)
CREATININE: 1.41 mg/dL — AB (ref 0.76–1.27)
Chloride: 101 mmol/L (ref 96–106)
GFR calc Af Amer: 51 mL/min/{1.73_m2} — ABNORMAL LOW (ref >59)
GFR, EST NON AFRICAN AMERICAN: 44 mL/min/{1.73_m2} — AB (ref >59)
GLUCOSE: 111 mg/dL — AB (ref 65–99)
Potassium: 5.1 mmol/L (ref 3.5–5.2)
SODIUM: 139 mmol/L (ref 134–144)

## 2018-01-19 LAB — CBC
HEMATOCRIT: 41.3 % (ref 37.5–51.0)
HEMOGLOBIN: 13.5 g/dL (ref 13.0–17.7)
MCH: 29 pg (ref 26.6–33.0)
MCHC: 32.7 g/dL (ref 31.5–35.7)
MCV: 89 fL (ref 79–97)
Platelets: 393 10*3/uL (ref 150–450)
RBC: 4.66 x10E6/uL (ref 4.14–5.80)
RDW: 15.6 % — ABNORMAL HIGH (ref 12.3–15.4)
WBC: 8.9 10*3/uL (ref 3.4–10.8)

## 2018-01-19 LAB — TSH+FREE T4
FREE T4: 1.68 ng/dL (ref 0.82–1.77)
TSH: 6.38 u[IU]/mL — AB (ref 0.450–4.500)

## 2018-01-20 DIAGNOSIS — Z5189 Encounter for other specified aftercare: Secondary | ICD-10-CM | POA: Diagnosis not present

## 2018-01-21 DIAGNOSIS — R410 Disorientation, unspecified: Secondary | ICD-10-CM | POA: Diagnosis not present

## 2018-01-23 ENCOUNTER — Ambulatory Visit: Payer: PPO | Admitting: Adult Health

## 2018-01-23 ENCOUNTER — Encounter: Payer: Self-pay | Admitting: Adult Health

## 2018-01-23 ENCOUNTER — Telehealth: Payer: Self-pay | Admitting: Cardiology

## 2018-01-23 ENCOUNTER — Other Ambulatory Visit: Payer: Self-pay | Admitting: Cardiology

## 2018-01-23 VITALS — BP 132/76 | HR 66 | Ht 65.0 in | Wt 146.0 lb

## 2018-01-23 DIAGNOSIS — I10 Essential (primary) hypertension: Secondary | ICD-10-CM

## 2018-01-23 DIAGNOSIS — Z5189 Encounter for other specified aftercare: Secondary | ICD-10-CM | POA: Diagnosis not present

## 2018-01-23 DIAGNOSIS — E039 Hypothyroidism, unspecified: Secondary | ICD-10-CM

## 2018-01-23 DIAGNOSIS — R55 Syncope and collapse: Secondary | ICD-10-CM

## 2018-01-23 DIAGNOSIS — I480 Paroxysmal atrial fibrillation: Secondary | ICD-10-CM

## 2018-01-23 NOTE — Telephone Encounter (Signed)
New Message:     Pt's wife called and said pt needs to be seen.Pt's heart rate  have been dropping, also having dizziness >pt passed out Friday night and EMS was called. I made an appt with Lurena Joiner on Thursday.Please call to evaluate.

## 2018-01-23 NOTE — Telephone Encounter (Signed)
Spoke with pt wife, Friday after getting up and walking across the room, the patient became dizzy and passed out. His wife was able to get him in a chair so he did not fall. EMS reported a bp 152/78 and pulse 68. They mentioned to the patient about going to the ER but he refused. The rest of the weekend he was fine. This morning he had dizziness and bp 144/79 and pulse 60 by wife report. Medications conformed. They will come to the office today to be seen. They will bring his ECG, bp records and medications to the appointment.

## 2018-01-23 NOTE — Progress Notes (Addendum)
Cardiology Office Note   Date:  01/23/2018   ID:  Justin [name], DOB 05/07/1929, MRN [id number]  PCP:  Mosie Lukes, MD  Cardiologist:  [facility]  Chief Complaint  Patient presents with  . Follow-up    Heart rate dropping.  . Dizziness  . Loss of Consciousness     History of Present Illness: Justin [name] is a 84 y.o. male who presents as an add-on today after having a syncopal episode over the weekend. He has a history of CAD with CABG in 2001, and atrial fibrillaiton. NSTEMI 12/18 which demonstrated occluded native vessels, 95% SVG to OM1 (PCI with DES), occluded SVG to RCA and patent LIMA to LAD. Most recent echocardiogram demonstrated EF of 40%-45%. Moderate AS (mean gradient of 21 mmHg, mild MR and mild LAE. He was placed on amiodarone for atrial fib. Metoprolol and diltiazem were discontinued due to bradycardia.   He reports daily near syncope and 11 episodes of actual los of consciousness. He is a resident of [name] and had episode of yesterday. EMS and Firemen were called. Rhythm strip was completed revealing SR with PAC's. Rate of 68 bpm. The patient states that he feels suddenly weak, everything goes gray, and he feels himself falling. Sometimes he can feel this coming on and is able to sit down before he falls. He denies chest pain or dyspnea, aura associated with these episodes.   Past Medical History:  Diagnosis Date  . Basal cell carcinoma   . BPH (benign prostatic hypertrophy)   . CAD (coronary artery disease) 1991   a. H/o MI w/ PTCA;  b. s/p CABG by Dr Roxan Hockey w/ LIMA-LAD, SVG-OM1-OM3, SVG-D1, SVG-PDA  . CHF (congestive heart failure) ([place])   . Complication of anesthesia    Wife reports history of confusion after last surgery in 10/2016-lasted a few days  . HTN (hypertension)   . Hyperlipidemia   . Internal hemorrhoids   . Moderate aortic stenosis    a. 08/2014 Echo: EF 55-60%, no rwma, Gr 1 DD, mod AS, mildly dil asc Ao, mild MR, sev dil LA.  . NSTEMI  (non-ST elevated myocardial infarction) ([place]) 05/2017    PCI with DES to SVG-OM1. SVG-OM3 occluded, LIMA-LAD and SVG-RCA patent  . PAF (paroxysmal atrial fibrillation) ([place])    a. Dx 08/2014;  b. CHA2DS2VASc = 4 - decision made to withold [place] 2/2 unsteady gait and syncope.  . Sigmoid diverticulitis   . Syncope    a. 08/2014 - ? orthostatic    Past Surgical History:  Procedure Laterality Date  . CARDIAC CATHETERIZATION    . COLONOSCOPY    . CORONARY ARTERY BYPASS GRAFT  2000   Dr Roxan Hockey  LIMA-LAD, SVG-OM1-OM3, SVG-D1, SVG-PDA.   [name] CORONARY STENT INTERVENTION N/A 06/13/2017   Procedure: CORONARY STENT INTERVENTION;  Surgeon: Lorretta Harp, MD;  Location: [place] [facility];  Service: Cardiovascular;  Laterality: N/A;  . CYSTOSCOPY N/A 12/07/2016   Procedure: CYSTOSCOPY;  Surgeon: Cleon Gustin, MD;  [facility];  Service: Urology;  Laterality: N/A;  NEEDS 30 MIN TOTAL FOR SURGERY  . CYSTOSCOPY WITH URETHRAL DILATATION N/A 02/21/2017   Procedure: DILATATION OF BLADDER NECK WITH INJECTION OF MITOMYCIN C;  Surgeon: Cleon Gustin, MD;  [facility];  Service: Urology;  Laterality: N/A;  . EYE SURGERY     bil cataract  . HIP ARTHROPLASTY Left 10/14/2015   Procedure: LEFT HIP HEMI ARTHROPLASTY;  Surgeon: [name], MD;  Location: [place];  Service: Orthopedics;  Laterality: Left;  . LEFT HEART CATH AND CORS/GRAFTS ANGIOGRAPHY N/A 06/13/2017   Procedure: LEFT HEART CATH AND CORS/GRAFTS ANGIOGRAPHY;  Surgeon: Lorretta Harp, MD;  Location: [place] [facility];  Service: Cardiovascular;  Laterality: N/A;  . SHOULDER SURGERY     x2  . TONSILLECTOMY    . TRANSTHORACIC ECHOCARDIOGRAM  10-06-2016   dr Stanford Breed   mild LVH, ef 40-45%, diffuse hypokinesis/ moderate AV calcification leaflets and annulus,  severe thickened leaflets, mod.-sev. aortic stenosis (Valva area 0.61cm^2, mean grandient 53mmHg, peak gradiant 14mmHg)/  moderate thickened , mild calcified MV leaflets  without stenosis, moderate MR/  severe LAE/ trivial PR and TR  . TRANSURETHRAL RESECTION OF BLADDER NECK N/A 12/07/2016   Procedure: TRANSURETHRAL RESECTION OF BLADDER NECK;  Surgeon: Cleon Gustin, MD;  [facility];  Service: Urology;  Laterality: N/A;  . XI ROBOTIC ASSISTED SIMPLE PROSTATECTOMY N/A 11/04/2016   Procedure: XI ROBOTIC ASSISTED SIMPLE PROSTATECTOMY;  Surgeon: Cleon Gustin, MD;  [facility];  Service: Urology;  Laterality: N/A;     Current Outpatient Medications  Medication Sig Dispense Refill  . acetaminophen (TYLENOL) 500 MG tablet Take 500 mg by mouth daily as needed for moderate pain.    [name] amiodarone (PACERONE) 200 MG tablet Take 200 mg by mouth daily.    [name] amiodarone (PACERONE) 200 MG tablet TAKE 1 TABLET BY MOUTH TWICE DAILY FOR 2 WEEKS THEN 1 TABLET ONCE DAILY 74 tablet 6  . apixaban (ELIQUIS) 5 MG TABS tablet Take 1 tablet (5 mg total) by mouth 2 (two) times daily. 60 tablet 11  . beta carotene w/minerals (OCUVITE) tablet Take 1 tablet by mouth daily.    . carbidopa-levodopa (SINEMET IR) 25-100 MG tablet Take 1 tablet by mouth 3 (three) times daily.    . Carboxymethylcellulose Sodium (THERATEARS OP) Apply 1 drop to eye daily as needed (dry eyes).    . clopidogrel (PLAVIX) 75 MG tablet Take 1 tablet (75 mg total) by mouth daily with breakfast. 90 tablet 3  . docusate sodium (COLACE) 100 MG capsule Take 100 mg by mouth every morning.     . ferrous sulfate 325 (65 FE) MG tablet Take 325 mg by mouth daily with breakfast.     . finasteride (PROSCAR) 5 MG tablet Take 5 mg by mouth every morning.     [name] levothyroxine (SYNTHROID, LEVOTHROID) 75 MCG tablet Take 1 tablet (75 mcg total) by mouth daily before breakfast. 90 tablet 3  . lovastatin (MEVACOR) 20 MG tablet Take 1 tablet (20 mg total) by mouth daily with breakfast. (Patient taking differently: Take 20 mg by mouth daily at 6 PM. ) 90 tablet 3  . meclizine (ANTIVERT) 25 MG tablet Take 1 tablet (25 mg  total) by mouth 3 (three) times daily as needed for dizziness. 30 tablet 0  . Multiple Vitamin (MULTIVITAMIN WITH MINERALS) TABS tablet Take 1 tablet by mouth daily.    . nitroGLYCERIN (NITROSTAT) 0.4 MG SL tablet Place 1 tablet (0.4 mg total) under the tongue every 5 (five) minutes as needed for chest pain. 25 tablet 1  . Probiotic Product (PROBIOTIC DAILY PO) Take 1 capsule by mouth daily.    [name] amLODipine (NORVASC) 2.5 MG tablet Take 1 tablet (2.5 mg total) by mouth daily. 180 tablet 3   No current facility-administered medications for this visit.     Allergies:   Morphine and related; Diltiazem hcl; Metoprolol; Toradol [ketorolac tromethamine]; Hydrocodone; and Dutasteride    Social  History:  The patient  reports that he quit smoking about 46 years ago. He has a 7.50 pack-year smoking history. He has never used smokeless tobacco. He reports that he drinks about 0.6 - 1.2 oz of alcohol per week. He reports that he does not use drugs.   Family History:  The patient's family history includes Alcohol abuse in his brother, father, and unknown relative; Bipolar disorder in his mother; Cancer in his brother, maternal grandfather, and sister; Coronary artery disease in his father; Glaucoma in his sister; Heart attack in his father; Heart disease in his mother; Hyperlipidemia in his father.    ROS: All other systems are reviewed and negative. Unless otherwise mentioned in H&P    PHYSICAL EXAM: VS:  Pulse 66   Ht 5\' 5"  (1.651 m)   Wt 146 lb (66.2 kg)   BMI 24.30 kg/m  , BMI Body mass index is 24.3 kg/m. GEN: Well nourished, well developed, in no acute distress  HEENT: normal  Neck: no JVD, carotid bruits, or masses Cardiac: RRR, occasional irregular systole; 2/6 systolic murmur, rubs, or gallops,no edema  Respiratory:  clear to auscultation bilaterally, normal work of breathing GI: soft, nontender, nondistended, + BS MS: no deformity or atrophy  Skin: warm and dry, no rash,lacerations and  skin tears on the right arm.  Neuro:  Strength and sensation are intact Psych: euthymic mood, full affect   EKG:  NSR with LAFB, T-wave inversion V5-V6. Unchanged from previous EKG.  Recent Labs: 2017-06-30: B Natriuretic Peptide 423.1 08/03/2017: ALT 13 01/18/2018: BUN 25; Creatinine, Ser 1.41; Hemoglobin 13.5; Platelets 393; Potassium 5.1; Sodium 139; TSH 6.380    Lipid Panel    Component Value Date/Time   CHOL 154 06/11/2017 0406   TRIG 39 06/11/2017 0406   HDL 76 06/11/2017 0406   CHOLHDL 2.0 06/11/2017 0406   VLDL 8 06/11/2017 0406   LDLCALC 70 06/11/2017 0406      Wt Readings from Last 3 Encounters:  01/23/18 146 lb (66.2 kg)  11/02/17 143 lb 6.4 oz (65 kg)  10/24/17 141 lb 15.6 oz (64.4 kg)      Other studies Reviewed: Echocardiogram 30-Jun-2017 Left ventricle: The cavity size was normal. Wall thickness was   increased in a pattern of moderate LVH. Basal to mid   anterolateral akinesis. Basal to mid inferolateral akinesis.   Anterior wall appears hypokinetic. Systolic function was mildly   to moderately reduced. The estimated ejection fraction was in the   range of 40% to 45%. Doppler parameters are consistent with   abnormal left ventricular relaxation (grade 1 diastolic   dysfunction). - Aortic valve: Probably trileaflet; severely calcified leaflets.   There was moderate stenosis. Mean gradient (S): 21 mm Hg. Peak   gradient (S): 37 mm Hg. Valve area (VTI): 1.06 cm^2. - Aorta: Mildly dilated aortic root. Aortic root dimension: 38 mm   (ED). - Mitral valve: Mildly calcified annulus. There was mild   regurgitation. - Left atrium: The atrium was mildly dilated. - Right ventricle: The cavity size was normal. Systolic function   was mildly reduced. - Tricuspid valve: Peak RV-RA gradient (S): 18 mm Hg. - Pulmonary arteries: PA peak pressure: 21 mm Hg (S). - Inferior vena cava: The vessel was normal in size. The   respirophasic diameter changes were in the normal  range (>= 50%),   consistent with normal central venous pressure.  Impressions:  - Normal LV size with moderate LV hypertrophy. EF 40-45% with wall   motion  abnormalities as noted above. Normal RV size with mildly   decreased systolic function. Moderate aortic stenosis. Mild   mitral regurgitation.  Cardiac Cath 06/13/2017  Conclusion     Ost Cx to Prox Cx lesion is 100% stenosed.  Ost LAD to Prox LAD lesion is 100% stenosed.  Mid RCA lesion is 100% stenosed.  Origin to Prox Graft lesion before Ost 2nd Mrg is 100% stenosed.  Origin to Prox Graft lesion is 95% stenosed.  A stent was successfully placed.  Post intervention, there is a 0% residual stenosis     ASSESSMENT AND PLAN:  1. Syncope: Has had 11 episodes of syncope, over the last 4 months with almost daily near syncope. He had syncopal episode yesterday where he injured his right am with several skin tears. Suspect post-termination pauses, although uncertain. Will place a 30 day cardiac monitor on him for evaluation of arrhythmia, morphology, and duration.   I have checked orthostatics in the office today. Lying 131/74, HR 65; Sitting 118/71, HR 66;  Standing 121/69 HR 69. Standing (>2 mints) 117/71` HR 72.   I have discussed this with Dr. Stanford Breed, who graciously review his case with me, who is an agreement with this. I have also discussed stopping Eliquis because of the high risk of falls, with risks outweighing benefits. He is in agreement with stopping the Eliquis.   I have explained this to the patient and his wife who verbalize understanding and will stop the Eliquis. He has not driven for over 2 years.   2. Atrial fib: He may be having post termination pauses, causing near syncope. He will continue amiodarone but no longer take anticoagulation therapy. He will continue amiodarone.   3. CAD: Hx of DES to the SVG to OM. Continue Plavix.   4. . Hypothyroidism: TSH has been reviewed and is improved to 6.2. He  will continue levothyroxine as directed without change in dose.    Current medicines are reviewed at length with the patient today.    Labs/ tests ordered today include: Cardiac monitor. See Dr. Stanford Breed on follow up.   Phill Myron. West Pugh, ANP, AACC   01/23/2018 2:08 PM    [facility] [address] [place], [place] [address] Phone: [phone]; Fax: [phone]

## 2018-01-23 NOTE — Patient Instructions (Signed)
Medication Instructions:  STOP ELIQUIS   If you need a refill on your cardiac medications before your next appointment, please call your pharmacy.  Testing/Procedures: Your physician has recommended that you wear an event monitor-30 day. Event monitors are medical devices that record the heart's electrical activity. Doctors most often [place] these monitors to diagnose arrhythmias. Arrhythmias are problems with the speed or rhythm of the heartbeat. The monitor is a small, portable device. You can wear one while you do your normal daily activities. This is usually used to diagnose what is causing palpitations/syncope (passing out). This will be performed at our [facility] location - [address].  Follow-Up: Your physician wants you to follow-up in: [place]    Thank you for choosing [facility] at [facility]!!

## 2018-01-25 ENCOUNTER — Ambulatory Visit (INDEPENDENT_AMBULATORY_CARE_PROVIDER_SITE_OTHER): Payer: PPO

## 2018-01-25 DIAGNOSIS — R55 Syncope and collapse: Secondary | ICD-10-CM | POA: Diagnosis not present

## 2018-01-25 DIAGNOSIS — Z5189 Encounter for other specified aftercare: Secondary | ICD-10-CM | POA: Diagnosis not present

## 2018-01-26 ENCOUNTER — Ambulatory Visit: Payer: PPO | Admitting: Neurology

## 2018-01-26 ENCOUNTER — Ambulatory Visit: Payer: PPO | Admitting: Cardiology

## 2018-01-30 ENCOUNTER — Telehealth: Payer: Self-pay | Admitting: *Deleted

## 2018-01-30 NOTE — Telephone Encounter (Signed)
Received FL2 from [facility] Medicaid Program [place] via [place] at [place]; forwarded to provider/SLS 08/05

## 2018-01-30 NOTE — Telephone Encounter (Signed)
Received FL2 from [facility] Medicaid Program [place] via [place] at [place]; forwarded to provider/SLS 08/05

## 2018-01-31 NOTE — Telephone Encounter (Signed)
[place] needs form back today, please advise. CB # [id number], Larena Glassman

## 2018-01-31 NOTE — Telephone Encounter (Signed)
Me    01/30/18 1:42 PM  Note    Received FL2 from [place] via [place] at [place]; forwarded to provider/SLS 08/05

## 2018-02-01 DIAGNOSIS — E785 Hyperlipidemia, unspecified: Secondary | ICD-10-CM | POA: Diagnosis not present

## 2018-02-01 DIAGNOSIS — I480 Paroxysmal atrial fibrillation: Secondary | ICD-10-CM | POA: Diagnosis not present

## 2018-02-01 DIAGNOSIS — I110 Hypertensive heart disease with heart failure: Secondary | ICD-10-CM | POA: Diagnosis not present

## 2018-02-01 DIAGNOSIS — I509 Heart failure, unspecified: Secondary | ICD-10-CM | POA: Diagnosis not present

## 2018-02-02 DIAGNOSIS — F0390 Unspecified dementia without behavioral disturbance: Secondary | ICD-10-CM | POA: Diagnosis not present

## 2018-02-02 DIAGNOSIS — I10 Essential (primary) hypertension: Secondary | ICD-10-CM | POA: Diagnosis not present

## 2018-02-02 DIAGNOSIS — G20 Parkinson's disease: Secondary | ICD-10-CM | POA: Diagnosis not present

## 2018-02-02 DIAGNOSIS — I4891 Unspecified atrial fibrillation: Secondary | ICD-10-CM | POA: Diagnosis not present

## 2018-02-02 DIAGNOSIS — I2510 Atherosclerotic heart disease of native coronary artery without angina pectoris: Secondary | ICD-10-CM | POA: Diagnosis not present

## 2018-02-03 ENCOUNTER — Encounter: Payer: Self-pay | Admitting: *Deleted

## 2018-02-16 ENCOUNTER — Ambulatory Visit (HOSPITAL_BASED_OUTPATIENT_CLINIC_OR_DEPARTMENT_OTHER)
Admission: RE | Admit: 2018-02-16 | Discharge: 2018-02-16 | Disposition: A | Discharge status: Home / Self Care | Payer: PPO | Source: Ambulatory Visit | Attending: Cardiology | Admitting: Cardiology

## 2018-02-16 DIAGNOSIS — J449 Chronic obstructive pulmonary disease, unspecified: Secondary | ICD-10-CM | POA: Diagnosis not present

## 2018-02-16 DIAGNOSIS — Z7901 Long term (current) use of anticoagulants: Secondary | ICD-10-CM | POA: Insufficient documentation

## 2018-02-16 DIAGNOSIS — I480 Paroxysmal atrial fibrillation: Secondary | ICD-10-CM | POA: Insufficient documentation

## 2018-02-16 DIAGNOSIS — Z79899 Other long term (current) drug therapy: Secondary | ICD-10-CM | POA: Diagnosis not present

## 2018-03-03 ENCOUNTER — Other Ambulatory Visit: Payer: Self-pay | Admitting: *Deleted

## 2018-03-03 MED ORDER — LOVASTATIN 20 MG PO TABS: 20.0000 mg | ORAL_TABLET | Freq: Every day | ORAL | 1 refills | Status: DC

## 2018-03-13 NOTE — Progress Notes (Signed)
HPI: FU CAD s/p CABG (2001) and atrial fibrillation. ABIs 7/17 showed vessels noncompressible.Had NSTEMI 12/18; cath showed occluded native vessels, 95 SVG to OM1 (PCI with DES), occluded SVG to OM2 and 3, patent SVG to RCA and patent LIMA to LAD. Echo 12/18 showed EF 40-45, moderate AS (mean gradient 21 mmHg), mild MR and mild LAE. Also on amiodarone for PAF. Metoprolol and cardizem DCed during previous ov due to bradycardia.Seen with syncope 7/19; concern for post termination pauses. Monitor 7/19 showed no significant arrhythmia. Since last seen,he has had improvement in his dizzy spells.  No syncope.  He denies increased dyspnea, chest pain or recurrent falls.  Current Outpatient Medications  Medication Sig Dispense Refill  . acetaminophen (TYLENOL) 500 MG tablet Take 500 mg by mouth daily as needed for moderate pain.    [name] amiodarone (PACERONE) 200 MG tablet Take 200 mg by mouth daily.    . beta carotene w/minerals (OCUVITE) tablet Take 1 tablet by mouth daily.    . carbidopa-levodopa (SINEMET IR) 25-100 MG tablet Take 1 tablet by mouth 3 (three) times daily.    . Carboxymethylcellulose Sodium (THERATEARS OP) Apply 1 drop to eye daily as needed (dry eyes).    . clopidogrel (PLAVIX) 75 MG tablet Take 1 tablet (75 mg total) by mouth daily with breakfast. 90 tablet 3  . docusate sodium (COLACE) 100 MG capsule Take 100 mg by mouth every morning.     [name] ELIQUIS 5 MG TABS tablet TAKE ONE TABLET BY MOUTH TWICE DAILY 180 tablet 1  . ferrous sulfate 325 (65 FE) MG tablet Take 325 mg by mouth daily with breakfast.     . finasteride (PROSCAR) 5 MG tablet Take 5 mg by mouth every morning.     [name] levothyroxine (SYNTHROID, LEVOTHROID) 75 MCG tablet Take 1 tablet (75 mcg total) by mouth daily before breakfast. 90 tablet 3  . lovastatin (MEVACOR) 20 MG tablet Take 1 tablet (20 mg total) by mouth daily at 6 PM. 90 tablet 1  . meclizine (ANTIVERT) 25 MG tablet Take 1 tablet (25 mg total) by mouth 3  (three) times daily as needed for dizziness. 30 tablet 0  . Multiple Vitamin (MULTIVITAMIN WITH MINERALS) TABS tablet Take 1 tablet by mouth daily.    . nitroGLYCERIN (NITROSTAT) 0.4 MG SL tablet Place 1 tablet (0.4 mg total) under the tongue every 5 (five) minutes as needed for chest pain. 25 tablet 1  . Probiotic Product (PROBIOTIC DAILY PO) Take 1 capsule by mouth daily.    [name] amLODipine (NORVASC) 2.5 MG tablet Take 1 tablet (2.5 mg total) by mouth daily. 180 tablet 3   No current facility-administered medications for this visit.      Past Medical History:  Diagnosis Date  . Basal cell carcinoma   . BPH (benign prostatic hypertrophy)   . CAD (coronary artery disease) 1991   a. H/o MI w/ PTCA;  b. s/p CABG by Dr Roxan Hockey w/ LIMA-LAD, SVG-OM1-OM3, SVG-D1, SVG-PDA  . CHF (congestive heart failure) ([name])   . Complication of anesthesia    Wife reports history of confusion after last surgery in 10/2016-lasted a few days  . HTN (hypertension)   . Hyperlipidemia   . Internal hemorrhoids   . Moderate aortic stenosis    a. 08/2014 Echo: EF 55-60%, no rwma, Gr 1 DD, mod AS, mildly dil asc Ao, mild MR, sev dil LA.  . NSTEMI (non-ST elevated myocardial infarction) ([employer]) 05/2017    PCI with  DES to SVG-OM1. SVG-OM3 occluded, LIMA-LAD and SVG-RCA patent  . PAF (paroxysmal atrial fibrillation) ([place])    a. Dx 08/2014;  b. CHA2DS2VASc = 4 - decision made to withold [place] 2/2 unsteady gait and syncope.  . Sigmoid diverticulitis   . Syncope    a. 08/2014 - ? orthostatic    Past Surgical History:  Procedure Laterality Date  . CARDIAC CATHETERIZATION    . COLONOSCOPY    . CORONARY ARTERY BYPASS GRAFT  2000   Dr Roxan Hockey  LIMA-LAD, SVG-OM1-OM3, SVG-D1, SVG-PDA.   [name] CORONARY STENT INTERVENTION N/A 06/13/2017   Procedure: CORONARY STENT INTERVENTION;  Surgeon: Lorretta Harp, MD;  Location: [place] [facility];  Service: Cardiovascular;  Laterality: N/A;  . CYSTOSCOPY N/A 12/07/2016   Procedure:  CYSTOSCOPY;  Surgeon: Cleon Gustin, MD;  [facility];  Service: Urology;  Laterality: N/A;  NEEDS 30 MIN TOTAL FOR SURGERY  . CYSTOSCOPY WITH URETHRAL DILATATION N/A 02/21/2017   Procedure: DILATATION OF BLADDER NECK WITH INJECTION OF MITOMYCIN C;  Surgeon: Cleon Gustin, MD;  [facility];  Service: Urology;  Laterality: N/A;  . EYE SURGERY     bil cataract  . HIP ARTHROPLASTY Left 10/14/2015   Procedure: LEFT HIP HEMI ARTHROPLASTY;  Surgeon: [name], MD;  Location: [place] [facility];  Service: Orthopedics;  Laterality: Left;  . LEFT HEART CATH AND CORS/GRAFTS ANGIOGRAPHY N/A 06/13/2017   Procedure: LEFT HEART CATH AND CORS/GRAFTS ANGIOGRAPHY;  Surgeon: Lorretta Harp, MD;  Location: [place] [facility];  Service: Cardiovascular;  Laterality: N/A;  . SHOULDER SURGERY     x2  . TONSILLECTOMY    . TRANSTHORACIC ECHOCARDIOGRAM  10-06-2016   dr Stanford Breed   mild LVH, ef 40-45%, diffuse hypokinesis/ moderate AV calcification leaflets and annulus,  severe thickened leaflets, mod.-sev. aortic stenosis (Valva area 0.61cm^2, mean grandient 83mmHg, peak gradiant 3mmHg)/  moderate thickened , mild calcified MV leaflets without stenosis, moderate MR/  severe LAE/ trivial PR and TR  . TRANSURETHRAL RESECTION OF BLADDER NECK N/A 12/07/2016   Procedure: TRANSURETHRAL RESECTION OF BLADDER NECK;  Surgeon: Cleon Gustin, MD;  [facility];  Service: Urology;  Laterality: N/A;  . XI ROBOTIC ASSISTED SIMPLE PROSTATECTOMY N/A 11/04/2016   Procedure: XI ROBOTIC ASSISTED SIMPLE PROSTATECTOMY;  Surgeon: Cleon Gustin, MD;  [facility];  Service: Urology;  Laterality: N/A;    Social History   Socioeconomic History  . Marital status: Married    Spouse name: Not on file  . Number of children: 2  . Years of education: Not on file  . Highest education level: Not on file  Occupational History  . 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 Financial  resource strain: Not very hard  . Food insecurity:    Worry: Never true    Inability: Never true  . Transportation needs:    Medical: No    Non-medical: No  Tobacco Use  . Smoking status: Former Smoker    Packs/day: 0.50    Years: 15.00    Pack years: 7.50    Last attempt to quit: 06/29/1971    Years since quitting: 46.7  . Smokeless tobacco: Never Used  Substance and Sexual Activity  . Alcohol use: Yes    Alcohol/week: 1.0 - 2.0 standard drinks    Types: 1 - 2 Glasses of wine per week    Comment: alternates wine and liquor. 1/2 pint liquor/week  . Drug use: No  . Sexual activity: Never  Comment: lives with wife, retired English as a second language teacher, no dietary restrictions  Lifestyle  . Physical activity:    Days per week: 0 days    Minutes per session: 0 min  . Stress: Not at all  Relationships  . Social connections:    Talks on phone: Not on file    Gets together: Not on file    Attends religious service: Not on file    Active member of club or organization: Not on file    Attends meetings of clubs or organizations: Not on file    Relationship status: Not on file  . Intimate partner violence:    Fear of current or ex partner: Not on file    Emotionally abused: Not on file    Physically abused: Not on file    Forced sexual activity: Not on file  Other Topics Concern  . Not on file  Social History Narrative   Pt and wife have an Independent home at [facility], wants to move to an apartment soon.    Family History  Problem Relation Age of Onset  . Coronary artery disease Father   . Hyperlipidemia Father   . Heart attack Father   . Alcohol abuse Father   . Bipolar disorder Mother   . Heart disease Mother   . Alcohol abuse Brother   . Cancer Brother   . Cancer Maternal Grandfather        oral cancer, chew tobacco  . Cancer Sister   . Glaucoma Sister   . Alcohol abuse Unknown     ROS: no fevers or chills, productive cough, hemoptysis, dysphasia,  odynophagia, melena, hematochezia, dysuria, hematuria, rash, seizure activity, orthopnea, PND, pedal edema, claudication. Remaining systems are negative.  Physical Exam: Well-developed well-nourished frail in no acute distress.  Skin is warm and dry.  HEENT is normal.  Neck is supple.  Chest is clear to auscultation with normal expansion.  Cardiovascular exam is regular rate and rhythm. 3/6 systolic mumur Abdominal exam nontender or distended. No masses palpated. Extremities show trace edema. neuro grossly intact   A/P  1 paroxysmal atrial fibrillation-patient remains in sinus rhythm today on exam.  Continue present dose of amiodarone.  Recent chest x-ray with COPD but no amiodarone toxicity.  Recheck TSH, free T4 and liver functions.  Apixaban was discontinued at previous office visit because of dizzy spells and falling.  We will continue off for now.  He understands the higher risk of CVA but for now I feel risk outweighs benefit.  We will reconsider this when he returns in 3 months.  2 history of moderate aortic stenosis-we will plan to repeat echocardiogram December 2019.  3 history of syncope-no recurrent episodes.  Monitor without significant arrhythmia.  We will follow for now.  4 coronary artery disease-continue Plavix and statin.  5 hypertension-blood pressure is borderline.  Discontinue amlodipine and follow.  6 hyperlipidemia-continue statin.  7 peripheral vascular disease-continue statin.    Kirk Ruths, MD

## 2018-03-20 ENCOUNTER — Telehealth: Payer: Self-pay | Admitting: Neurology

## 2018-03-20 NOTE — Telephone Encounter (Signed)
Patient wife is calling about the paperwork she brought in to our office she has not heard anything back about it please call

## 2018-03-21 ENCOUNTER — Encounter: Payer: Self-pay | Admitting: Cardiology

## 2018-03-21 ENCOUNTER — Ambulatory Visit: Payer: PPO | Admitting: Cardiology

## 2018-03-21 VITALS — BP 104/70 | HR 76 | Ht 65.0 in | Wt 147.0 lb

## 2018-03-21 DIAGNOSIS — E039 Hypothyroidism, unspecified: Secondary | ICD-10-CM | POA: Diagnosis not present

## 2018-03-21 DIAGNOSIS — I2510 Atherosclerotic heart disease of native coronary artery without angina pectoris: Secondary | ICD-10-CM

## 2018-03-21 DIAGNOSIS — I480 Paroxysmal atrial fibrillation: Secondary | ICD-10-CM | POA: Diagnosis not present

## 2018-03-21 DIAGNOSIS — I350 Nonrheumatic aortic (valve) stenosis: Secondary | ICD-10-CM

## 2018-03-21 NOTE — Patient Instructions (Signed)
Medication Instructions:   STOP AMLODIPINE  Labwork:  Your physician recommends that you HAVE LAB WORK TODAY  Testing/Procedures:  Your physician has requested that you have an echocardiogram. Echocardiography is a painless test that uses sound waves to create images of your heart. It provides your doctor with information about the size and shape of your heart and how well your heart's chambers and valves are working. This procedure takes approximately one hour. There are no restrictions for this procedure.SCHEDULE IN DECEMBER    Follow-Up:  Your physician recommends that you schedule a follow-up appointment in: 3 MONTHS AFTER ECHO COMPLETE   If you need a refill on your cardiac medications before your next appointment, please call your pharmacy.

## 2018-03-22 ENCOUNTER — Other Ambulatory Visit: Payer: Self-pay | Admitting: *Deleted

## 2018-03-22 ENCOUNTER — Telehealth: Payer: Self-pay | Admitting: *Deleted

## 2018-03-22 DIAGNOSIS — E039 Hypothyroidism, unspecified: Secondary | ICD-10-CM

## 2018-03-22 DIAGNOSIS — R7989 Other specified abnormal findings of blood chemistry: Secondary | ICD-10-CM

## 2018-03-22 LAB — HEPATIC FUNCTION PANEL
ALBUMIN: 4.1 g/dL (ref 3.5–4.7)
ALT: 12 IU/L (ref 0–44)
AST: 19 IU/L (ref 0–40)
Alkaline Phosphatase: 65 IU/L (ref 39–117)
Bilirubin Total: 0.4 mg/dL (ref 0.0–1.2)
Bilirubin, Direct: 0.15 mg/dL (ref 0.00–0.40)
Total Protein: 6.4 g/dL (ref 6.0–8.5)

## 2018-03-22 LAB — TSH+FREE T4
Free T4: 2.03 ng/dL — ABNORMAL HIGH (ref 0.82–1.77)
TSH: 3.37 u[IU]/mL (ref 0.450–4.500)

## 2018-03-22 MED ORDER — LEVOTHYROXINE SODIUM 50 MCG PO TABS: 50.0000 ug | ORAL_TABLET | Freq: Every day | ORAL | 3 refills | Status: DC

## 2018-03-22 NOTE — Telephone Encounter (Addendum)
-----   Message from Lelon Perla, MD sent at 03/22/2018  8:18 AM EDT ----- Change Synthroid to 50 mcg daily.  Check TSH and free T4 in 3 months. Kirk Ruths, MD  Spoke with pt, aware of results. New script sent to the pharmacy and lab orders mailed to the patient.

## 2018-03-24 DIAGNOSIS — Z23 Encounter for immunization: Secondary | ICD-10-CM | POA: Diagnosis not present

## 2018-03-27 ENCOUNTER — Telehealth: Payer: Self-pay | Admitting: Cardiology

## 2018-03-27 ENCOUNTER — Other Ambulatory Visit: Payer: Self-pay

## 2018-03-27 MED ORDER — AMLODIPINE BESYLATE 2.5 MG PO TABS: 2.5000 mg | ORAL_TABLET | Freq: Every day | ORAL | 3 refills | Status: DC

## 2018-03-27 NOTE — Progress Notes (Signed)
Error

## 2018-03-27 NOTE — Telephone Encounter (Signed)
New message:      Pt's wife states she has some follow up questions about the pt's last OV

## 2018-03-27 NOTE — Telephone Encounter (Signed)
Continue present BP meds and follow BP; increase synthroid per note on labs 9/24 Kirk Ruths

## 2018-03-27 NOTE — Telephone Encounter (Signed)
Spoke with pt wife, Aware of dr crenshaw's recommendations.  

## 2018-03-27 NOTE — Telephone Encounter (Signed)
Called and LMOVM advising I have been unable to locate any paperwork. I asked that [place] please return my call about what type of paperwork I should be looking for.

## 2018-03-27 NOTE — Telephone Encounter (Signed)
Pt wife called to be sure pt Synthroid was being changed since she was not there when the pt received the lab results over the phone from [place].. She also wants to let Dr. Stanford Breed know that she never stopped the Amlodipine since she was worried that the low BP in the office was just a "fluke".. She says he never runs that low... His BP over the last 5 days have been: 136/77 153/83 112/68 139/84 154/84...Marland Kitchenall of these still on the Amlodipine 2.5mg .. She says the pt has been feeling well and would like to know if Dr. Stanford Breed would like him to continue with it or if he would like to make any other changes?

## 2018-05-16 NOTE — Progress Notes (Signed)
NEUROLOGY FOLLOW UP OFFICE NOTE  PLUMMER MATICH [id number]  HISTORY OF PRESENT ILLNESS: Justin [name] is an 84 year old man with paroxysmal atrial fibrillation, orthostatic hypotension, hypertension, hyperlipidemia, and coronary artery disease who follows up for mild cognitive impairment and questionable Parkinson's disease.  He is accompanied by his wife who supplements history.  UPDATE:  He is currently taking carbidopa-levodopa 25/100 mg 3 times daily.  1-1/2 tablets 3 times daily caused dizziness. He states his gait has gotten worse.  There has been a lot going on since last visit.  He has moved.  His wife was diagnosed with oral cancer and required surgery.  A caregiver comes 4 times a week to take him out to eat and socialize.  Cognition has been stable.  However, he can't remember his caregiver's name.    HISTORY: He has had problems with balance for 6 years.  He loses balance easily if his eyes are closed or if he stands in one place for several minutes.  He cannot stand on one foot. He also notes trouble walking down hills as well as walking up stairs. When he walks, he will sometimes stumble.  He also has chronic problems with his left foot though, such as bunions. He denies any neck pain or lower back pain radiating down the legs. He denies any sensation of numbness and tingling. He denies focal weakness but occasionally he reports generalized weakness where his legs give out. He denies any bowel or bladder dysfunction. When he closes his eyes, he has a sensation of sway, but no actual vertigo or fullness in his head. He does have occasional tinnitus and wears hearing aids. He has not had any sensation of passing out.    To help with gait, he was started on a trial of carbidopa-levodopa which appeared to somewhat improve his gait.  I saw him in 2014 and he underwent a neuropathy workup.  NCV-EMG was normal.  2 hour glucose tolerance test was normal.  B12 was 542, methylmalonic acid  0.11,  Sed Rate 12, folate over 24.8, RPR nonreactive, SPEP and IFE negative, ANA negative.  Recent Hgb A1c wasa 5.2 and TSH was 4.34.  For a couple of years he had had episodes of confusion where he cannot process or collect his thoughts.  It may last up to an hour and occur once a week.  He may forget medications or names of people.  Each time he has a surgery, he becomes confused and his memory gets worse.  This occurred both after knee surgery and dilatation of bladder neck.  In evaluating memory, B12 from 05/25/16 was 599.  Repeat testing on 11/25/16 was 741.  TSH was 7.432.  He was previously on Aricept which was discontinued due to tiredness and dizziness.  He had had imaging of the head for dizziness.  CT of head from 09/13/14 was personally reviewed and revealed atrophy and chronic small vessel ischemic changes but nothing acute.  Repeat CT of head from 03/23/16 was unchanged.  PAST MEDICAL HISTORY: Past Medical History:  Diagnosis Date  . Basal cell carcinoma   . BPH (benign prostatic hypertrophy)   . CAD (coronary artery disease) 1991   a. H/o MI w/ PTCA;  b. s/p CABG by Dr Roxan Hockey w/ LIMA-LAD, SVG-OM1-OM3, SVG-D1, SVG-PDA  . CHF (congestive heart failure) ([place])   . Complication of anesthesia    Wife reports history of confusion after last surgery in 10/2016-lasted a few days  . HTN (hypertension)   .  Hyperlipidemia   . Internal hemorrhoids   . Moderate aortic stenosis    a. 08/2014 Echo: EF 55-60%, no rwma, Gr 1 DD, mod AS, mildly dil asc Ao, mild MR, sev dil LA.  . NSTEMI (non-ST elevated myocardial infarction) ([place]) 05/2017    PCI with DES to SVG-OM1. SVG-OM3 occluded, LIMA-LAD and SVG-RCA patent  . PAF (paroxysmal atrial fibrillation) ([place])    a. Dx 08/2014;  b. CHA2DS2VASc = 4 - decision made to withold [place] 2/2 unsteady gait and syncope.  . Sigmoid diverticulitis   . Syncope    a. 08/2014 - ? orthostatic    MEDICATIONS: Current Outpatient Medications on File Prior to  Visit  Medication Sig Dispense Refill  . acetaminophen (TYLENOL) 500 MG tablet Take 500 mg by mouth daily as needed for moderate pain.    [name] amiodarone (PACERONE) 200 MG tablet Take 200 mg by mouth daily.    [name] amLODipine (NORVASC) 2.5 MG tablet Take 1 tablet (2.5 mg total) by mouth daily. 90 tablet 3  . beta carotene w/minerals (OCUVITE) tablet Take 1 tablet by mouth daily.    . carbidopa-levodopa (SINEMET IR) 25-100 MG tablet Take 1 tablet by mouth 3 (three) times daily.    . Carboxymethylcellulose Sodium (THERATEARS OP) Apply 1 drop to eye daily as needed (dry eyes).    . clopidogrel (PLAVIX) 75 MG tablet Take 1 tablet (75 mg total) by mouth daily with breakfast. 90 tablet 3  . docusate sodium (COLACE) 100 MG capsule Take 100 mg by mouth every morning.     [name] ELIQUIS 5 MG TABS tablet TAKE ONE TABLET BY MOUTH TWICE DAILY 180 tablet 1  . ferrous sulfate 325 (65 FE) MG tablet Take 325 mg by mouth daily with breakfast.     . finasteride (PROSCAR) 5 MG tablet Take 5 mg by mouth every morning.     [name] levothyroxine (SYNTHROID, LEVOTHROID) 50 MCG tablet Take 1 tablet (50 mcg total) by mouth daily before breakfast. 90 tablet 3  . lovastatin (MEVACOR) 20 MG tablet Take 1 tablet (20 mg total) by mouth daily at 6 PM. 90 tablet 1  . meclizine (ANTIVERT) 25 MG tablet Take 1 tablet (25 mg total) by mouth 3 (three) times daily as needed for dizziness. 30 tablet 0  . Multiple Vitamin (MULTIVITAMIN WITH MINERALS) TABS tablet Take 1 tablet by mouth daily.    . nitroGLYCERIN (NITROSTAT) 0.4 MG SL tablet Place 1 tablet (0.4 mg total) under the tongue every 5 (five) minutes as needed for chest pain. 25 tablet 1  . Probiotic Product (PROBIOTIC DAILY PO) Take 1 capsule by mouth daily.     No current facility-administered medications on file prior to visit.     ALLERGIES: Allergies  Allergen Reactions  . Morphine And Related Other (See Comments)    Severe confusion  . Diltiazem Hcl     bradycardia  . Metoprolol      bradycardia  . Toradol [Ketorolac Tromethamine] Other (See Comments)    Dizzy and light headed  . Hydrocodone Other (See Comments)    hallucinations  . Dutasteride Diarrhea    GI upset and incontinence    FAMILY HISTORY: Family History  Problem Relation Age of Onset  . Coronary artery disease Father   . Hyperlipidemia Father   . Heart attack Father   . Alcohol abuse Father   . Bipolar disorder Mother   . Heart disease Mother   . Alcohol abuse Brother   . Cancer Brother   .  Cancer Maternal Grandfather        oral cancer, chew tobacco  . Cancer Sister   . Glaucoma Sister   . Alcohol abuse Unknown    SOCIAL HISTORY: Social History   Socioeconomic History  . Marital status: Married    Spouse name: Not on file  . Number of children: 2  . Years of education: Not on file  . Highest education level: Not on file  Occupational History  . 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 Financial resource strain: Not very hard  . Food insecurity:    Worry: Never true    Inability: Never true  . Transportation needs:    Medical: No    Non-medical: No  Tobacco Use  . Smoking status: Former Smoker    Packs/day: 0.50    Years: 15.00    Pack years: 7.50    Last attempt to quit: 06/29/1971    Years since quitting: 46.9  . Smokeless tobacco: Never Used  Substance and Sexual Activity  . Alcohol use: Yes    Alcohol/week: 1.0 - 2.0 standard drinks    Types: 1 - 2 Glasses of wine per week    Comment: alternates wine and liquor. 1/2 pint liquor/week  . Drug use: No  . 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 Physical activity:    Days per week: 0 days    Minutes per session: 0 min  . Stress: Not at all  Relationships  . Social connections:    Talks on phone: Not on file    Gets together: Not on file    Attends religious service: Not on file    Active member of  club or organization: Not on file    Attends meetings of clubs or organizations: Not on file    Relationship status: Not on file  . Intimate partner violence:    Fear of current or ex partner: Not on file    Emotionally abused: Not on file    Physically abused: Not on file    Forced sexual activity: Not on file  Other Topics Concern  . Not on file  Social History Narrative   Pt and wife have an Independent home at [facility], wants to move to an apartment soon.    REVIEW OF SYSTEMS: Constitutional: No fevers, chills, or sweats, no generalized fatigue, change in appetite Eyes: No visual changes, double vision, eye pain Ear, nose and throat: No hearing loss, ear pain, nasal congestion, sore throat Cardiovascular: No chest pain, palpitations Respiratory:  No shortness of breath at rest or with exertion, wheezes GastrointestinaI: No nausea, vomiting, diarrhea, abdominal pain, fecal incontinence Genitourinary:  No dysuria, urinary retention or frequency Musculoskeletal:  No neck pain, back pain Integumentary: No rash, pruritus, skin lesions Neurological: as above Psychiatric: depression Endocrine: No palpitations, fatigue, diaphoresis, mood swings, change in appetite, change in weight, increased thirst Hematologic/Lymphatic:  No purpura, petechiae. Allergic/Immunologic: no itchy/runny eyes, nasal congestion, recent allergic reactions, rashes  PHYSICAL EXAM: Blood pressure 112/68, pulse 71, height 5\' 5"  (1.651 m), weight 148 lb (67.1 kg), SpO2 98 %. General: No acute distress.  Patient appears well-groomed.   Head:  Normocephalic/atraumatic Eyes:  Fundi examined but not visualized Neck: supple, no paraspinal tenderness, full range of motion Heart:  Regular rate and rhythm Lungs:  Clear to auscultation bilaterally Back: No paraspinal tenderness Neurological Exam: Alert and oriented to person, place, and time.  Attention span and concentration intact.  Delayed recall and remote  memory intact.  Fund of knowledge intact.  Speech fluent and not dysarthric, language intact.   MMSE - Mini Mental State Exam 05/17/2018 03/28/2017 05/25/2016  Orientation to time 4 4 5   Orientation to Place 5 5 4   Registration 3 3 3   Attention/ Calculation 5 5 5   Recall 2 0 1  Language- name 2 objects 2 2 2   Language- repeat 1 1 1   Language- follow 3 step command 3 3 3   Language- read & follow direction 1 1 1   Write a sentence 1 1 1   Copy design 0 1 1  Total score 27 26 27    CN II-XII intact.  Bulk normal with trace cogwheel rigidity in the wrists, no tremor.  Bradykinesia noted.  Muscle strength 5/5 throughout.  Sensation to light touch intact.  Deep tendon reflexes 2+ throughout.  Finger-to-nose testing intact.  Upright posture side bent right with reduced arm swing bilaterally.  Reduced stride but able to pick up feet.  Romberg with sway  IMPRESSION: Cognitive impairment.  Stable Unsteady gait.  Multifactorial.  I really don't suspect Parkinson's disease so we will discontinue Sinemet  PLAN: 1.  Stop carbidopa-levodopa.  If a significant decline in gait is observed, they will contact me and we can restart it. 2.  They defer starting a different cholinesterase inhibitor at this time.  As cognitively stable, will monitor 3.  Fall precautions 4.  Follow up in 6 months  25 minutes spent face to face with patient, over 50% spent discussing diagnosis and management.  Metta Clines, DO  CC: [name], MD

## 2018-05-17 ENCOUNTER — Ambulatory Visit (INDEPENDENT_AMBULATORY_CARE_PROVIDER_SITE_OTHER): Payer: PPO | Admitting: Neurology

## 2018-05-17 ENCOUNTER — Encounter: Payer: Self-pay | Admitting: Neurology

## 2018-05-17 VITALS — BP 112/68 | HR 71 | Ht 65.0 in | Wt 148.0 lb

## 2018-05-17 DIAGNOSIS — G3184 Mild cognitive impairment, so stated: Secondary | ICD-10-CM | POA: Diagnosis not present

## 2018-05-17 NOTE — Patient Instructions (Signed)
1.  I don't think you have Parkinson's disease.  Therefore, we will stop the carbidopa-levodopa. 2.  Follow up in 6 months.

## 2018-05-30 ENCOUNTER — Other Ambulatory Visit: Payer: Self-pay | Admitting: Cardiology

## 2018-05-30 ENCOUNTER — Ambulatory Visit (HOSPITAL_BASED_OUTPATIENT_CLINIC_OR_DEPARTMENT_OTHER)
Admission: RE | Admit: 2018-05-30 | Discharge: 2018-05-30 | Disposition: A | Discharge status: Home / Self Care | Payer: PPO | Source: Ambulatory Visit | Attending: Cardiology | Admitting: Cardiology

## 2018-05-30 DIAGNOSIS — I350 Nonrheumatic aortic (valve) stenosis: Secondary | ICD-10-CM | POA: Insufficient documentation

## 2018-05-30 DIAGNOSIS — R7989 Other specified abnormal findings of blood chemistry: Secondary | ICD-10-CM | POA: Diagnosis not present

## 2018-05-30 NOTE — Progress Notes (Signed)
*   Echocardiogram 2D Echocardiogram has been performed.  Justin [name] T [name] 05/30/2018, 11:28 AM

## 2018-05-31 LAB — T4, FREE: Free T4: 1.89 ng/dL — ABNORMAL HIGH (ref 0.82–1.77)

## 2018-05-31 LAB — TSH: TSH: 7.19 u[IU]/mL — AB (ref 0.450–4.500)

## 2018-06-05 NOTE — Progress Notes (Signed)
HPI: FU CAD s/p CABG (2001) and atrial fibrillation. ABIs 7/17 showed vessels noncompressible.Had NSTEMI 12/18; cath showed occluded native vessels, 95 SVG to OM1 (PCI with DES), occluded SVG to OM2 and 3, patent SVG to RCA and patent LIMA to LAD. Echo 12/18 showed EF 40-45, moderate AS (mean gradient 21 mmHg), mild MR and mild LAE. Also on amiodarone for PAF. Metoprolol and cardizem DCed duringpreviousov due to bradycardia.Seen with syncope 7/19; concern for post termination pauses. Monitor 7/19 showed no significant arrhythmia.Echo 12/19 showed EF 40-45, moderate LVH, grade 1 DD, moderate to severe stenosis with mean gradient 30 mmHg, AVA 0.5 cm2, mild MR, mild to moderate LAE, mild RV dysfunction. Since last seen, patient has mild dyspnea on exertion and some fatigue.  He denies orthopnea, PND, exertional chest pain or syncope.  Current Outpatient Medications  Medication Sig Dispense Refill  . acetaminophen (TYLENOL) 500 MG tablet Take 500 mg by mouth daily as needed for moderate pain.    [name] amiodarone (PACERONE) 200 MG tablet Take 200 mg by mouth daily.    [name] amLODipine (NORVASC) 2.5 MG tablet Take 1 tablet (2.5 mg total) by mouth daily. 90 tablet 3  . beta carotene w/minerals (OCUVITE) tablet Take 1 tablet by mouth daily.    . carbidopa-levodopa (SINEMET IR) 25-100 MG tablet Take 1 tablet by mouth daily.    . Carboxymethylcellulose Sodium (THERATEARS OP) Apply 1 drop to eye daily as needed (dry eyes).    . clopidogrel (PLAVIX) 75 MG tablet Take 1 tablet (75 mg total) by mouth daily with breakfast. 90 tablet 3  . docusate sodium (COLACE) 100 MG capsule Take 100 mg by mouth every morning.     . ferrous sulfate 325 (65 FE) MG tablet Take 325 mg by mouth daily with breakfast.     . finasteride (PROSCAR) 5 MG tablet Take 5 mg by mouth every morning.     [name] levothyroxine (SYNTHROID, LEVOTHROID) 50 MCG tablet Take 1 tablet (50 mcg total) by mouth daily before breakfast. 90 tablet 3  .  lovastatin (MEVACOR) 20 MG tablet Take 1 tablet (20 mg total) by mouth daily at 6 PM. 90 tablet 1  . meclizine (ANTIVERT) 25 MG tablet Take 1 tablet (25 mg total) by mouth 3 (three) times daily as needed for dizziness. 30 tablet 0  . Multiple Vitamin (MULTIVITAMIN WITH MINERALS) TABS tablet Take 1 tablet by mouth daily.    . nitroGLYCERIN (NITROSTAT) 0.4 MG SL tablet Place 1 tablet (0.4 mg total) under the tongue every 5 (five) minutes as needed for chest pain. 25 tablet 1  . Probiotic Product (PROBIOTIC DAILY PO) Take 1 capsule by mouth daily.     No current facility-administered medications for this visit.      Past Medical History:  Diagnosis Date  . Basal cell carcinoma   . BPH (benign prostatic hypertrophy)   . CAD (coronary artery disease) 1991   a. H/o MI w/ PTCA;  b. s/p CABG by Dr Roxan Hockey w/ LIMA-LAD, SVG-OM1-OM3, SVG-D1, SVG-PDA  . CHF (congestive heart failure) ([place])   . Complication of anesthesia    Wife reports history of confusion after last surgery in 10/2016-lasted a few days  . HTN (hypertension)   . Hyperlipidemia   . Internal hemorrhoids   . Moderate aortic stenosis    a. 08/2014 Echo: EF 55-60%, no rwma, Gr 1 DD, mod AS, mildly dil asc Ao, mild MR, sev dil LA.  . NSTEMI (non-ST elevated myocardial infarction) (  [place]) 05/2017    PCI with DES to SVG-OM1. SVG-OM3 occluded, LIMA-LAD and SVG-RCA patent  . PAF (paroxysmal atrial fibrillation) ([place])    a. Dx 08/2014;  b. CHA2DS2VASc = 4 - decision made to withold [place] 2/2 unsteady gait and syncope.  . Sigmoid diverticulitis   . Syncope    a. 08/2014 - ? orthostatic    Past Surgical History:  Procedure Laterality Date  . CARDIAC CATHETERIZATION    . COLONOSCOPY    . CORONARY ARTERY BYPASS GRAFT  2000   Dr Roxan Hockey  LIMA-LAD, SVG-OM1-OM3, SVG-D1, SVG-PDA.   [name] CORONARY STENT INTERVENTION N/A 06/13/2017   Procedure: CORONARY STENT INTERVENTION;  Surgeon: Lorretta Harp, MD;  Location: [facility];  Service:  Cardiovascular;  Laterality: N/A;  . CYSTOSCOPY N/A 12/07/2016   Procedure: CYSTOSCOPY;  Surgeon: Cleon Gustin, MD;  Location: [facility];  Service: Urology;  Laterality: N/A;  NEEDS 30 MIN TOTAL FOR SURGERY  . CYSTOSCOPY WITH URETHRAL DILATATION N/A 02/21/2017   Procedure: DILATATION OF BLADDER NECK WITH INJECTION OF MITOMYCIN C;  Surgeon: Cleon Gustin, MD;  Location: [facility];  Service: Urology;  Laterality: N/A;  . EYE SURGERY     bil cataract  . HIP ARTHROPLASTY Left 10/14/2015   Procedure: LEFT HIP HEMI ARTHROPLASTY;  Surgeon: [name], MD;  Location: [facility];  Service: Orthopedics;  Laterality: Left;  . LEFT HEART CATH AND CORS/GRAFTS ANGIOGRAPHY N/A 06/13/2017   Procedure: LEFT HEART CATH AND CORS/GRAFTS ANGIOGRAPHY;  Surgeon: Lorretta Harp, MD;  Location: [facility];  Service: Cardiovascular;  Laterality: N/A;  . SHOULDER SURGERY     x2  . TONSILLECTOMY    . TRANSTHORACIC ECHOCARDIOGRAM  10-06-2016   dr Stanford Breed   mild LVH, ef 40-45%, diffuse hypokinesis/ moderate AV calcification leaflets and annulus,  severe thickened leaflets, mod.-sev. aortic stenosis (Valva area 0.61cm^2, mean grandient 53mmHg, peak gradiant 31mmHg)/  moderate thickened , mild calcified MV leaflets without stenosis, moderate MR/  severe LAE/ trivial PR and TR  . TRANSURETHRAL RESECTION OF BLADDER NECK N/A 12/07/2016   Procedure: TRANSURETHRAL RESECTION OF BLADDER NECK;  Surgeon: Cleon Gustin, MD;  Location: [facility];  Service: Urology;  Laterality: N/A;  . XI ROBOTIC ASSISTED SIMPLE PROSTATECTOMY N/A 11/04/2016   Procedure: XI ROBOTIC ASSISTED SIMPLE PROSTATECTOMY;  Surgeon: Cleon Gustin, MD;  Location: [facility];  Service: Urology;  Laterality: N/A;    Social History   Socioeconomic History  . Marital status: Married    Spouse name: Not on file  . Number of children: 2  . Years of education: Not on file  . Highest education level: Not on file  Occupational History  .  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 Financial resource strain: Not very hard  . Food insecurity:    Worry: Never true    Inability: Never true  . Transportation needs:    Medical: No    Non-medical: No  Tobacco Use  . Smoking status: Former Smoker    Packs/day: 0.50    Years: 15.00    Pack years: 7.50    Last attempt to quit: 06/29/1971    Years since quitting: 46.9  . Smokeless tobacco: Never Used  Substance and Sexual Activity  . Alcohol use: Yes    Alcohol/week: 1.0 - 2.0 standard drinks    Types: 1 - 2 Glasses of wine per week    Comment: alternates wine and liquor. 1/2 pint liquor/week  . Drug  use: No  . 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 Physical activity:    Days per week: 0 days    Minutes per session: 0 min  . Stress: Not at all  Relationships  . Social connections:    Talks on phone: Not on file    Gets together: Not on file    Attends religious service: Not on file    Active member of club or organization: Not on file    Attends meetings of clubs or organizations: Not on file    Relationship status: Not on file  . Intimate partner violence:    Fear of current or ex partner: Not on file    Emotionally abused: Not on file    Physically abused: Not on file    Forced sexual activity: Not on file  Other Topics Concern  . Not on file  Social History Narrative   Pt and wife have an Independent home at [facility], wants to move to an apartment soon.    Family History  Problem Relation Age of Onset  . Coronary artery disease Father   . Hyperlipidemia Father   . Heart attack Father   . Alcohol abuse Father   . Bipolar disorder Mother   . Heart disease Mother   . Alcohol abuse Brother   . Cancer Brother   . Cancer Maternal Grandfather        oral cancer, chew tobacco  . Cancer Sister   . Glaucoma Sister   . Alcohol abuse Other      ROS: no fevers or chills, productive cough, hemoptysis, dysphasia, odynophagia, melena, hematochezia, dysuria, hematuria, rash, seizure activity, orthopnea, PND, pedal edema, claudication. Remaining systems are negative.  Physical Exam: Well-developed well-nourished in no acute distress.  Skin is warm and dry.  HEENT is normal.  Neck is supple.  Chest is clear to auscultation with normal expansion.  Cardiovascular exam is regular rate and rhythm.  3/6 systolic murmur left sternal border. Abdominal exam nontender or distended. No masses palpated. Extremities show no edema. neuro grossly intact  A/P  1 paroxysmal atrial fibrillation-patient is in sinus rhythm today on examination.  Continue present dose of amiodarone.  I have discontinued apixaban previously because of recurrent dizzy spells and falling.  Patient understands there is a higher risk of CVA but at this point risk outweighs benefit.  2 history of moderate aortic stenosis-patient has moderate to severe aortic stenosis on recent echocardiogram.  His symptoms are not significantly changed.  I discussed the possibility of TAVR and he will consider in the future if his symptoms worsen.  I will see him back and likely repeat his echocardiogram in 6 months.  3 coronary artery disease-continue Plavix and statin.  4 hypertension-patient's blood pressure is controlled.  Continue present medications and follow.  5 hyperlipidemia-continue statin.  6 peripheral vascular disease-continue statin.  7 history of syncope-no recurrent episodes.  Kirk Ruths, MD

## 2018-06-14 ENCOUNTER — Ambulatory Visit (INDEPENDENT_AMBULATORY_CARE_PROVIDER_SITE_OTHER): Payer: PPO | Admitting: Cardiology

## 2018-06-14 ENCOUNTER — Encounter: Payer: Self-pay | Admitting: Cardiology

## 2018-06-14 VITALS — BP 134/68 | HR 65 | Ht 65.0 in | Wt 151.8 lb

## 2018-06-14 DIAGNOSIS — I480 Paroxysmal atrial fibrillation: Secondary | ICD-10-CM | POA: Diagnosis not present

## 2018-06-14 DIAGNOSIS — I10 Essential (primary) hypertension: Secondary | ICD-10-CM | POA: Diagnosis not present

## 2018-06-14 DIAGNOSIS — E7800 Pure hypercholesterolemia, unspecified: Secondary | ICD-10-CM | POA: Diagnosis not present

## 2018-06-14 DIAGNOSIS — I2510 Atherosclerotic heart disease of native coronary artery without angina pectoris: Secondary | ICD-10-CM | POA: Diagnosis not present

## 2018-06-14 MED ORDER — NITROGLYCERIN 0.4 MG SL SUBL: 0.4000 mg | SUBLINGUAL_TABLET | SUBLINGUAL | 12 refills | Status: AC | PRN

## 2018-06-14 NOTE — Patient Instructions (Signed)
Medication Instructions:  NO CHANGE If you need a refill on your cardiac medications before your next appointment, please call your pharmacy.   Lab work: If you have labs (blood work) drawn today and your tests are completely normal, you will receive your results only by: [name] MyChart Message (if you have MyChart) OR . A paper copy in the mail If you have any lab test that is abnormal or we need to change your treatment, we will call you to review the results.   Follow-Up: Your physician recommends that you schedule a follow-up appointment in: [place] [facility] TO GET AN APPOINTMENT IN June 2020

## 2018-06-15 DIAGNOSIS — Z08 Encounter for follow-up examination after completed treatment for malignant neoplasm: Secondary | ICD-10-CM | POA: Diagnosis not present

## 2018-06-15 DIAGNOSIS — Z85828 Personal history of other malignant neoplasm of skin: Secondary | ICD-10-CM | POA: Diagnosis not present

## 2018-06-15 DIAGNOSIS — L570 Actinic keratosis: Secondary | ICD-10-CM | POA: Diagnosis not present

## 2018-06-15 DIAGNOSIS — L089 Local infection of the skin and subcutaneous tissue, unspecified: Secondary | ICD-10-CM | POA: Diagnosis not present

## 2018-07-05 DIAGNOSIS — H35033 Hypertensive retinopathy, bilateral: Secondary | ICD-10-CM | POA: Diagnosis not present

## 2018-07-05 DIAGNOSIS — Z961 Presence of intraocular lens: Secondary | ICD-10-CM | POA: Diagnosis not present

## 2018-07-05 DIAGNOSIS — H43813 Vitreous degeneration, bilateral: Secondary | ICD-10-CM | POA: Diagnosis not present

## 2018-07-05 DIAGNOSIS — H353132 Nonexudative age-related macular degeneration, bilateral, intermediate dry stage: Secondary | ICD-10-CM | POA: Diagnosis not present

## 2018-07-07 ENCOUNTER — Telehealth: Payer: Self-pay | Admitting: *Deleted

## 2018-07-07 NOTE — Telephone Encounter (Signed)
Received Comprehensive Eye Exam Report from [facility]; forwarded to provider/SLS 01/10

## 2018-07-10 ENCOUNTER — Other Ambulatory Visit: Payer: Self-pay

## 2018-07-10 ENCOUNTER — Other Ambulatory Visit: Payer: Self-pay | Admitting: Cardiology

## 2018-07-10 MED ORDER — CLOPIDOGREL BISULFATE 75 MG PO TABS: 75.0000 mg | ORAL_TABLET | Freq: Every day | ORAL | 3 refills | Status: DC

## 2018-07-10 NOTE — Telephone Encounter (Signed)
Please refill medication.   Thank you

## 2018-07-10 NOTE — Telephone Encounter (Signed)
° °  1. Which medications need to be refilled? (please list name of each medication and dose if known) Clopidogrel bisulfate 75mg  tablet one tablet daily  2. Which pharmacy/location (including street and city if local pharmacy) is medication to be sent to?[facility]  3. Do they need a 30 day or 90 day supply? [place]

## 2018-07-13 ENCOUNTER — Encounter: Payer: Self-pay | Admitting: Family Medicine

## 2018-07-24 ENCOUNTER — Encounter: Payer: Self-pay | Admitting: Family Medicine

## 2018-07-24 ENCOUNTER — Ambulatory Visit: Payer: PPO | Admitting: Family Medicine

## 2018-07-24 VITALS — BP 110/68 | HR 71 | Temp 97.5°F | Ht 65.0 in | Wt 146.2 lb

## 2018-07-24 DIAGNOSIS — N3001 Acute cystitis with hematuria: Secondary | ICD-10-CM

## 2018-07-24 LAB — POC URINALSYSI DIPSTICK (AUTOMATED)
Bilirubin, UA: NEGATIVE
Glucose, UA: NEGATIVE
Ketones, UA: NEGATIVE
Nitrite, UA: NEGATIVE
Protein, UA: POSITIVE — AB
Spec Grav, UA: 1.02 (ref 1.010–1.025)
Urobilinogen, UA: NEGATIVE E.U./dL — AB
pH, UA: 6 (ref 5.0–8.0)

## 2018-07-24 MED ORDER — SULFAMETHOXAZOLE-TRIMETHOPRIM 800-160 MG PO TABS: 1.0000 | ORAL_TABLET | Freq: Two times a day (BID) | ORAL | 0 refills | Status: DC

## 2018-07-24 NOTE — Progress Notes (Signed)
Chief Complaint  Patient presents with  . Fatigue  . Dysuria  . Dizziness    Justin [name] is a 85 y.o. male here for possible UTI.  Duration: 4 weeks Symptoms: pain w urination, freq, cloudy urine, dizziness, fatigue Denies: hematuria, urinary retention, fever, nausea, vomiting and flank pain Hx of recurrent UTI? No  Not circumcised.   ROS:  Constitutional: denies fever GU: As noted in HPI  Past Medical History:  Diagnosis Date  . Basal cell carcinoma   . BPH (benign prostatic hypertrophy)   . CAD (coronary artery disease) 1991   a. H/o MI w/ PTCA;  b. s/p CABG by Dr Roxan Hockey w/ LIMA-LAD, SVG-OM1-OM3, SVG-D1, SVG-PDA  . CHF (congestive heart failure) ([place])   . Complication of anesthesia    Wife reports history of confusion after last surgery in 10/2016-lasted a few days  . HTN (hypertension)   . Hyperlipidemia   . Internal hemorrhoids   . Moderate aortic stenosis    a. 08/2014 Echo: EF 55-60%, no rwma, Gr 1 DD, mod AS, mildly dil asc Ao, mild MR, sev dil LA.  . NSTEMI (non-ST elevated myocardial infarction) ([place]) 05/2017    PCI with DES to SVG-OM1. SVG-OM3 occluded, LIMA-LAD and SVG-RCA patent  . PAF (paroxysmal atrial fibrillation) ([place])    a. Dx 08/2014;  b. CHA2DS2VASc = 4 - decision made to withold [place] 2/2 unsteady gait and syncope.  . Sigmoid diverticulitis   . Syncope    a. 08/2014 - ? orthostatic    BP 110/68 (BP Location: Left Arm, Patient Position: Sitting, Cuff Size: Normal)   Pulse 71   Temp (!) 97.5 F (36.4 C) (Oral)   Ht 5\' 5"  (1.651 m)   Wt 146 lb 4 oz (66.3 kg)   SpO2 97%   BMI 24.34 kg/m  General: Awake, alert, appears stated age Heart: RRR, +3/6 SEM Lungs: CTAB, normal respiratory effort, no accessory muscle usage Abd: BS+, soft, NT, ND, no masses or organomegaly Rectal: Sphincter of appropriate tone for age, prostate enlarged without bogginess or ttp MSK: No CVA tenderness, neg Lloyd's sign Psych: Age appropriate judgment and  insight  Acute cystitis with hematuria - Plan: POCT Urinalysis Dipstick (Automated), Urine Culture, sulfamethoxazole-trimethoprim (BACTRIM DS,SEPTRA DS) 800-160 MG tablet  Orders as above. Stay hydrated. Seek immediate care if pt starts to develop fevers, new/worsening symptoms, uncontrollable N/V. F/u prn. The patient voiced understanding and agreement to the plan.  [place], DO 07/24/18 3:07 PM

## 2018-07-24 NOTE — Patient Instructions (Signed)
Stay hydrated.   Warning signs/symptoms: Uncontrollable nausea/vomiting, fevers, worsening symptoms despite treatment, confusion.  Give us around 2 business days to get culture back to you.  Let us know if you need anything. 

## 2018-07-25 ENCOUNTER — Other Ambulatory Visit: Payer: Self-pay

## 2018-07-25 ENCOUNTER — Emergency Department (HOSPITAL_BASED_OUTPATIENT_CLINIC_OR_DEPARTMENT_OTHER): Payer: PPO

## 2018-07-25 ENCOUNTER — Ambulatory Visit: Payer: Self-pay | Admitting: *Deleted

## 2018-07-25 ENCOUNTER — Observation Stay (HOSPITAL_BASED_OUTPATIENT_CLINIC_OR_DEPARTMENT_OTHER)
Admission: EM | Admit: 2018-07-25 | Discharge: 2018-07-27 | Disposition: A | Discharge status: Skilled Nursing Facility | Payer: PPO | Attending: Internal Medicine | Admitting: Internal Medicine

## 2018-07-25 ENCOUNTER — Encounter (HOSPITAL_BASED_OUTPATIENT_CLINIC_OR_DEPARTMENT_OTHER): Payer: Self-pay

## 2018-07-25 DIAGNOSIS — R4020 Unspecified coma: Secondary | ICD-10-CM | POA: Diagnosis not present

## 2018-07-25 DIAGNOSIS — Z87891 Personal history of nicotine dependence: Secondary | ICD-10-CM | POA: Diagnosis not present

## 2018-07-25 DIAGNOSIS — E785 Hyperlipidemia, unspecified: Secondary | ICD-10-CM | POA: Diagnosis not present

## 2018-07-25 DIAGNOSIS — I350 Nonrheumatic aortic (valve) stenosis: Secondary | ICD-10-CM | POA: Diagnosis not present

## 2018-07-25 DIAGNOSIS — E871 Hypo-osmolality and hyponatremia: Principal | ICD-10-CM | POA: Insufficient documentation

## 2018-07-25 DIAGNOSIS — N400 Enlarged prostate without lower urinary tract symptoms: Secondary | ICD-10-CM | POA: Insufficient documentation

## 2018-07-25 DIAGNOSIS — Z955 Presence of coronary angioplasty implant and graft: Secondary | ICD-10-CM | POA: Diagnosis not present

## 2018-07-25 DIAGNOSIS — Z79890 Hormone replacement therapy (postmenopausal): Secondary | ICD-10-CM | POA: Insufficient documentation

## 2018-07-25 DIAGNOSIS — M6281 Muscle weakness (generalized): Secondary | ICD-10-CM | POA: Diagnosis not present

## 2018-07-25 DIAGNOSIS — I252 Old myocardial infarction: Secondary | ICD-10-CM | POA: Diagnosis not present

## 2018-07-25 DIAGNOSIS — N189 Chronic kidney disease, unspecified: Secondary | ICD-10-CM | POA: Insufficient documentation

## 2018-07-25 DIAGNOSIS — R9431 Abnormal electrocardiogram [ECG] [EKG]: Secondary | ICD-10-CM | POA: Insufficient documentation

## 2018-07-25 DIAGNOSIS — I5042 Chronic combined systolic (congestive) and diastolic (congestive) heart failure: Secondary | ICD-10-CM | POA: Insufficient documentation

## 2018-07-25 DIAGNOSIS — B961 Klebsiella pneumoniae [K. pneumoniae] as the cause of diseases classified elsewhere: Secondary | ICD-10-CM | POA: Diagnosis not present

## 2018-07-25 DIAGNOSIS — I130 Hypertensive heart and chronic kidney disease with heart failure and stage 1 through stage 4 chronic kidney disease, or unspecified chronic kidney disease: Secondary | ICD-10-CM | POA: Insufficient documentation

## 2018-07-25 DIAGNOSIS — Z885 Allergy status to narcotic agent status: Secondary | ICD-10-CM | POA: Insufficient documentation

## 2018-07-25 DIAGNOSIS — Z951 Presence of aortocoronary bypass graft: Secondary | ICD-10-CM | POA: Diagnosis not present

## 2018-07-25 DIAGNOSIS — E039 Hypothyroidism, unspecified: Secondary | ICD-10-CM | POA: Insufficient documentation

## 2018-07-25 DIAGNOSIS — Z888 Allergy status to other drugs, medicaments and biological substances status: Secondary | ICD-10-CM | POA: Insufficient documentation

## 2018-07-25 DIAGNOSIS — I2510 Atherosclerotic heart disease of native coronary artery without angina pectoris: Secondary | ICD-10-CM | POA: Diagnosis not present

## 2018-07-25 DIAGNOSIS — N390 Urinary tract infection, site not specified: Secondary | ICD-10-CM | POA: Diagnosis present

## 2018-07-25 DIAGNOSIS — I480 Paroxysmal atrial fibrillation: Secondary | ICD-10-CM | POA: Insufficient documentation

## 2018-07-25 DIAGNOSIS — Z96642 Presence of left artificial hip joint: Secondary | ICD-10-CM | POA: Insufficient documentation

## 2018-07-25 DIAGNOSIS — G319 Degenerative disease of nervous system, unspecified: Secondary | ICD-10-CM | POA: Insufficient documentation

## 2018-07-25 DIAGNOSIS — Z9861 Coronary angioplasty status: Secondary | ICD-10-CM

## 2018-07-25 DIAGNOSIS — Z7902 Long term (current) use of antithrombotics/antiplatelets: Secondary | ICD-10-CM | POA: Diagnosis not present

## 2018-07-25 DIAGNOSIS — I509 Heart failure, unspecified: Secondary | ICD-10-CM

## 2018-07-25 DIAGNOSIS — R296 Repeated falls: Secondary | ICD-10-CM | POA: Diagnosis not present

## 2018-07-25 DIAGNOSIS — R2689 Other abnormalities of gait and mobility: Secondary | ICD-10-CM | POA: Diagnosis not present

## 2018-07-25 DIAGNOSIS — Z8249 Family history of ischemic heart disease and other diseases of the circulatory system: Secondary | ICD-10-CM | POA: Insufficient documentation

## 2018-07-25 DIAGNOSIS — R2681 Unsteadiness on feet: Secondary | ICD-10-CM | POA: Insufficient documentation

## 2018-07-25 DIAGNOSIS — Z79899 Other long term (current) drug therapy: Secondary | ICD-10-CM | POA: Insufficient documentation

## 2018-07-25 DIAGNOSIS — R531 Weakness: Secondary | ICD-10-CM

## 2018-07-25 DIAGNOSIS — I10 Essential (primary) hypertension: Secondary | ICD-10-CM | POA: Diagnosis present

## 2018-07-25 LAB — URINALYSIS, ROUTINE W REFLEX MICROSCOPIC
Bilirubin Urine: NEGATIVE
Glucose, UA: NEGATIVE mg/dL
Ketones, ur: NEGATIVE mg/dL
Nitrite: POSITIVE — AB
Protein, ur: NEGATIVE mg/dL
Specific Gravity, Urine: 1.015 (ref 1.005–1.030)
pH: 6 (ref 5.0–8.0)

## 2018-07-25 LAB — MAGNESIUM: Magnesium: 2 mg/dL (ref 1.7–2.4)

## 2018-07-25 LAB — BASIC METABOLIC PANEL
Anion gap: 8 (ref 5–15)
BUN: 23 mg/dL (ref 8–23)
CO2: 21 mmol/L — ABNORMAL LOW (ref 22–32)
Calcium: 8.8 mg/dL — ABNORMAL LOW (ref 8.9–10.3)
Chloride: 97 mmol/L — ABNORMAL LOW (ref 98–111)
Creatinine, Ser: 1.6 mg/dL — ABNORMAL HIGH (ref 0.61–1.24)
GFR calc Af Amer: 44 mL/min — ABNORMAL LOW (ref >60)
GFR calc non Af Amer: 38 mL/min — ABNORMAL LOW (ref >60)
Glucose, Bld: 108 mg/dL — ABNORMAL HIGH (ref 70–99)
Potassium: 5 mmol/L (ref 3.5–5.1)
Sodium: 126 mmol/L — ABNORMAL LOW (ref 135–145)

## 2018-07-25 LAB — PHOSPHORUS: Phosphorus: 3.3 mg/dL (ref 2.5–4.6)

## 2018-07-25 LAB — URINALYSIS, MICROSCOPIC (REFLEX): WBC, UA: >50 WBC/hpf (ref 0–5)

## 2018-07-25 LAB — CBC
HCT: 41.8 % (ref 39.0–52.0)
Hemoglobin: 13.6 g/dL (ref 13.0–17.0)
MCH: 28.8 pg (ref 26.0–34.0)
MCHC: 32.5 g/dL (ref 30.0–36.0)
MCV: 88.4 fL (ref 80.0–100.0)
Platelets: 393 10*3/uL (ref 150–400)
RBC: 4.73 MIL/uL (ref 4.22–5.81)
RDW: 15.3 % (ref 11.5–15.5)
WBC: 9.8 10*3/uL (ref 4.0–10.5)
nRBC: 0 % (ref 0.0–0.2)

## 2018-07-25 LAB — CBG MONITORING, ED: Glucose-Capillary: 103 mg/dL — ABNORMAL HIGH (ref 70–99)

## 2018-07-25 LAB — OSMOLALITY: Osmolality: 274 mOsm/kg — ABNORMAL LOW (ref 275–295)

## 2018-07-25 MED ORDER — SODIUM CHLORIDE 0.9 % IV SOLN
INTRAVENOUS | Status: DC
  Administered 2018-07-25 – 2018-07-26 (×2): via INTRAVENOUS

## 2018-07-25 MED ORDER — SODIUM CHLORIDE 0.9% FLUSH
3.0000 mL | Freq: Once | INTRAVENOUS | Status: DC
  Filled 2018-07-25: qty 3

## 2018-07-25 NOTE — ED Triage Notes (Signed)
Pt seen upstairs yesterday and dx with UTI. Pt started bactrim. Pt woke up this morning approx 830 with increased weakness to bilat legs. Pt has hx of parkinsons and stopped his Sinemet in December. Pt usually ambulates with walker- unable to ambulate today. Pt has cardiac hx as well. A/o x 4.

## 2018-07-25 NOTE — ED Notes (Signed)
Per wife- pt does not tolerating opioids well.

## 2018-07-25 NOTE — ED Provider Notes (Signed)
[facility] EMERGENCY DEPARTMENT Provider Note   CSN: [id number] Arrival date & time: 07/25/18  1958     History   Chief Complaint Chief Complaint  Patient presents with  . Weakness    HPI Justin [name] is a 85 y.o. male.  HPI   85 year old male with generalized weakness, particularly in his legs.  Onset is morning after he had gotten up.  He woke up and felt more or less in his usual state of health.  He was able to ambulate to breakfast with use of his walker which is his baseline.  Shortly after returning he felt like "my legs just would not hold me."  He has had diffuse generalized weakness since then to the point where he is having difficulty just standing.  He was just diagnosed with a urinary tract infection yesterday.  His wife states that he has had 4 doses of antibiotics since that time.  He has no fevers.  No acute respiratory complaints.  Denies any acute pain.  Past Medical History:  Diagnosis Date  . Basal cell carcinoma   . BPH (benign prostatic hypertrophy)   . CAD (coronary artery disease) 1991   a. H/o MI w/ PTCA;  b. s/p CABG by Dr Roxan Hockey w/ LIMA-LAD, SVG-OM1-OM3, SVG-D1, SVG-PDA  . CHF (congestive heart failure) ([place])   . Complication of anesthesia    Wife reports history of confusion after last surgery in 10/2016-lasted a few days  . HTN (hypertension)   . Hyperlipidemia   . Internal hemorrhoids   . Moderate aortic stenosis    a. 08/2014 Echo: EF 55-60%, no rwma, Gr 1 DD, mod AS, mildly dil asc Ao, mild MR, sev dil LA.  . NSTEMI (non-ST elevated myocardial infarction) ([place]) 05/2017    PCI with DES to SVG-OM1. SVG-OM3 occluded, LIMA-LAD and SVG-RCA patent  . PAF (paroxysmal atrial fibrillation) ([place])    a. Dx 08/2014;  b. CHA2DS2VASc = 4 - decision made to withold [place] 2/2 unsteady gait and syncope.  . Sigmoid diverticulitis   . Syncope    a. 08/2014 - ? orthostatic    Patient Active Problem List   Diagnosis Date Noted  . Sundowning  06/24/2017  . Ischemic cardiomyopathy 06/24/2017  . Anticoagulated 06/24/2017  . Syncope   . Sigmoid diverticulitis   . Internal hemorrhoids   . HTN (hypertension)   . Complication of anesthesia   . CHF (congestive heart failure) ([place])   . Basal cell carcinoma   . NSTEMI (non-ST elevated myocardial infarction) ([place])   . Chest pain 06/10/2017  . Unstable angina ([place]) 06/10/2017  . UTI (urinary tract infection) 11/24/2016  . Sepsis ([place]) 11/24/2016  . Generalized weakness 11/24/2016  . Hyponatremia 11/24/2016  . BPH with urinary obstruction 11/04/2016  . H/O prostatectomy 11/04/2016  . Fall   . Closed left hip fracture ([place]) 10/13/2015  . Medicare annual wellness visit, subsequent 03/16/2015  . Paroxysmal atrial fibrillation ([place]) 09/26/2014  . Orthostatic hypotension 09/15/2014  . Head trauma 09/14/2014  . Alteration in anticoagulation 09/14/2014  . Tinnitus 03/21/2014  . Epigastric pain 02/01/2013  . Gait instability 01/01/2013  . Disequilibrium 04/03/2012  . Hyperglycemia 09/13/2011  . Aortic stenosis 03/10/2011  . CAD S/P percutaneous coronary angioplasty 02/23/2010  . Hyperlipidemia 07/04/2008  . Essential hypertension 07/04/2008  . RHINITIS 07/04/2008  . BPH (benign prostatic hyperplasia) 07/04/2008  . Hx of CABG 06/28/1989    Past Surgical History:  Procedure Laterality Date  . CARDIAC CATHETERIZATION    .  COLONOSCOPY    . CORONARY ARTERY BYPASS GRAFT  2000   Dr Roxan Hockey  LIMA-LAD, SVG-OM1-OM3, SVG-D1, SVG-PDA.   [name] CORONARY STENT INTERVENTION N/A 06/13/2017   Procedure: CORONARY STENT INTERVENTION;  Surgeon: Lorretta Harp, MD;  Location: [facility];  Service: Cardiovascular;  Laterality: N/A;  . CYSTOSCOPY N/A 12/07/2016   Procedure: CYSTOSCOPY;  Surgeon: Cleon Gustin, MD;  Location: [facility];  Service: Urology;  Laterality: N/A;  NEEDS 30 MIN TOTAL FOR SURGERY  . CYSTOSCOPY WITH URETHRAL DILATATION N/A 02/21/2017   Procedure: DILATATION OF  BLADDER NECK WITH INJECTION OF MITOMYCIN C;  Surgeon: Cleon Gustin, MD;  Location: [facility];  Service: Urology;  Laterality: N/A;  . EYE SURGERY     bil cataract  . HIP ARTHROPLASTY Left 10/14/2015   Procedure: LEFT HIP HEMI ARTHROPLASTY;  Surgeon: [name], MD;  Location: [facility];  Service: Orthopedics;  Laterality: Left;  . LEFT HEART CATH AND CORS/GRAFTS ANGIOGRAPHY N/A 06/13/2017   Procedure: LEFT HEART CATH AND CORS/GRAFTS ANGIOGRAPHY;  Surgeon: Lorretta Harp, MD;  Location: [facility];  Service: Cardiovascular;  Laterality: N/A;  . SHOULDER SURGERY     x2  . TONSILLECTOMY    . TRANSTHORACIC ECHOCARDIOGRAM  10-06-2016   dr Stanford Breed   mild LVH, ef 40-45%, diffuse hypokinesis/ moderate AV calcification leaflets and annulus,  severe thickened leaflets, mod.-sev. aortic stenosis (Valva area 0.61cm^2, mean grandient 23mmHg, peak gradiant 13mmHg)/  moderate thickened , mild calcified MV leaflets without stenosis, moderate MR/  severe LAE/ trivial PR and TR  . TRANSURETHRAL RESECTION OF BLADDER NECK N/A 12/07/2016   Procedure: TRANSURETHRAL RESECTION OF BLADDER NECK;  Surgeon: Cleon Gustin, MD;  Location[facility];  Service: Urology;  Laterality: N/A;  . XI ROBOTIC ASSISTED SIMPLE PROSTATECTOMY N/A 11/04/2016   Procedure: XI ROBOTIC ASSISTED SIMPLE PROSTATECTOMY;  Surgeon: Cleon Gustin, MD;  Location: [facility];  Service: Urology;  Laterality: N/A;        Home Medications    Prior to Admission medications   Medication Sig Start Date End Date Taking? Authorizing Provider  acetaminophen (TYLENOL) 500 MG tablet Take 500 mg by mouth daily as needed for moderate pain.    [provider]  amiodarone (PACERONE) 200 MG tablet Take 200 mg by mouth daily.    [provider]  amLODipine (NORVASC) 2.5 MG tablet Take 1 tablet (2.5 mg total) by mouth daily. 03/27/18   Lelon Perla, MD  beta carotene w/minerals (OCUVITE) tablet Take 1 tablet by mouth daily.     [provider]  carbidopa-levodopa (SINEMET IR) 25-100 MG tablet Take 1 tablet by mouth daily.    [provider]  Carboxymethylcellulose Sodium (THERATEARS OP) Apply 1 drop to eye daily as needed (dry eyes).    [provider]  clopidogrel (PLAVIX) 75 MG tablet Take 1 tablet (75 mg total) by mouth daily with breakfast. 07/10/18   Lelon Perla, MD  docusate sodium (COLACE) 100 MG capsule Take 100 mg by mouth every morning.     [provider]  ferrous sulfate 325 (65 FE) MG tablet Take 325 mg by mouth daily with breakfast.     [provider]  finasteride (PROSCAR) 5 MG tablet Take 5 mg by mouth every morning.     [provider]  levothyroxine (SYNTHROID, LEVOTHROID) 50 MCG tablet Take 1 tablet (50 mcg total) by mouth daily before breakfast. 03/22/18   Stanford Breed, Denice Bors, MD  lovastatin (MEVACOR)  20 MG tablet Take 1 tablet (20 mg total) by mouth daily at 6 PM. 03/03/18   Crenshaw, Denice Bors, MD  meclizine (ANTIVERT) 25 MG tablet Take 1 tablet (25 mg total) by mouth 3 (three) times daily as needed for dizziness. 08/05/16   Fredia Sorrow, MD  Multiple Vitamin (MULTIVITAMIN WITH MINERALS) TABS tablet Take 1 tablet by mouth daily.    [provider]  nitroGLYCERIN (NITROSTAT) 0.4 MG SL tablet Place 1 tablet (0.4 mg total) under the tongue every 5 (five) minutes as needed for chest pain. 06/14/18   Lelon Perla, MD  Probiotic Product (PROBIOTIC DAILY PO) Take 1 capsule by mouth daily.    [provider]  sulfamethoxazole-trimethoprim (BACTRIM DS,SEPTRA DS) 800-160 MG tablet Take 1 tablet by mouth 2 (two) times daily for 7 days. 07/24/18 07/31/18  Shelda Pal, DO    Family History Family History  Problem Relation Age of Onset  . Coronary artery disease Father   . Hyperlipidemia Father   . Heart attack Father   . Alcohol abuse Father   . Bipolar disorder Mother   . Heart disease Mother   . Alcohol abuse  Brother   . Cancer Brother   . Cancer Maternal Grandfather        oral cancer, chew tobacco  . Cancer Sister   . Glaucoma Sister   . Alcohol abuse Other     Social History Social History   Tobacco Use  . Smoking status: Former Smoker    Packs/day: 0.50    Years: 15.00    Pack years: 7.50    Last attempt to quit: 06/29/1971    Years since quitting: 47.1  . Smokeless tobacco: Never Used  Substance Use Topics  . Alcohol use: Yes    Alcohol/week: 1.0 - 2.0 standard drinks    Types: 1 - 2 Glasses of wine per week    Comment: alternates wine and liquor. 1/2 pint liquor/week  . Drug use: No     Allergies   Morphine and related; Diltiazem hcl; Metoprolol; Toradol [ketorolac tromethamine]; Hydrocodone; and Dutasteride   Review of Systems Review of Systems  All systems reviewed and negative, other than as noted in HPI.  Physical Exam Updated Vital Signs BP (!) 158/76 (BP Location: Right Arm)   Pulse 72   Temp 97.7 F (36.5 C) (Oral)   Resp 17   Ht 5\' 5"  (1.651 m)   Wt 66.3 kg   SpO2 99%   BMI 24.34 kg/m   Physical Exam Vitals signs and nursing note reviewed.  Constitutional:      General: He is not in acute distress.    Appearance: He is well-developed.  HENT:     Head: Normocephalic and atraumatic.  Eyes:     General:        Right eye: No discharge.        Left eye: No discharge.     Conjunctiva/sclera: Conjunctivae normal.  Neck:     Musculoskeletal: Neck supple.  Cardiovascular:     Rate and Rhythm: Normal rate and regular rhythm.     Heart sounds: Normal heart sounds. No murmur. No friction rub. No gallop.   Pulmonary:     Effort: Pulmonary effort is normal. No respiratory distress.     Breath sounds: Normal breath sounds.  Abdominal:     General: There is no distension.     Palpations: Abdomen is soft.     Tenderness: There is no abdominal tenderness.  Musculoskeletal:  General: No tenderness.  Skin:    General: Skin is warm and dry.    Neurological:     Mental Status: He is alert and oriented to person, place, and time.     Cranial Nerves: No cranial nerve deficit.     Sensory: No sensory deficit.     Comments: Mild global weakness. No focal motor deficit.   Psychiatric:        Behavior: Behavior normal.        Thought Content: Thought content normal.      ED Treatments / Results  Labs (all labs ordered are listed, but only abnormal results are displayed) Labs Reviewed  URINE CULTURE - Abnormal; Notable for the following components:      Result Value   Culture   (*)    Value: <10,000 COLONIES/mL INSIGNIFICANT GROWTH Performed at [facility], [address], [place], [place] [address]    All other components within normal limits  BASIC METABOLIC PANEL - Abnormal; Notable for the following components:   Sodium 126 (*)    Chloride 97 (*)    CO2 21 (*)    Glucose, Bld 108 (*)    Creatinine, Ser 1.60 (*)    Calcium 8.8 (*)    GFR calc non Af Amer 38 (*)    GFR calc Af Amer 44 (*)    All other components within normal limits  URINALYSIS, ROUTINE W REFLEX MICROSCOPIC - Abnormal; Notable for the following components:   APPearance CLOUDY (*)    Hgb urine dipstick TRACE (*)    Nitrite POSITIVE (*)    Leukocytes, UA LARGE (*)    All other components within normal limits  TSH - Abnormal; Notable for the following components:   TSH 7.545 (*)    All other components within normal limits  OSMOLALITY - Abnormal; Notable for the following components:   Osmolality 274 (*)    All other components within normal limits  URINALYSIS, MICROSCOPIC (REFLEX) - Abnormal; Notable for the following components:   Bacteria, UA MANY (*)    All other components within normal limits  BASIC METABOLIC PANEL - Abnormal; Notable for the following components:   Sodium 133 (*)    Creatinine, Ser 1.32 (*)    Calcium 8.6 (*)    GFR calc non Af Amer 48 (*)    GFR calc Af Amer 56 (*)    All other components within normal limits   CBC - Abnormal; Notable for the following components:   Hemoglobin 12.5 (*)    HCT 37.8 (*)    All other components within normal limits  BASIC METABOLIC PANEL - Abnormal; Notable for the following components:   Sodium 133 (*)    Creatinine, Ser 1.26 (*)    Calcium 8.5 (*)    GFR calc non Af Amer 51 (*)    GFR calc Af Amer 59 (*)    All other components within normal limits  CBG MONITORING, ED - Abnormal; Notable for the following components:   Glucose-Capillary 103 (*)    All other components within normal limits  CBC  NA AND K (SODIUM & POTASSIUM), RAND UR  CREATININE, URINE, RANDOM  OSMOLALITY, URINE  MAGNESIUM  PHOSPHORUS    EKG EKG Interpretation  Date/Time:  Tuesday July 25 2018 20:13:39 EST Ventricular Rate:  69 PR Interval:    QRS Duration: 136 QT Interval:  418 QTC Calculation: 448 R Axis:   -65 Text Interpretation:  Sinus rhythm LVH with IVCD,  LAD and secondary repol abnrm Confirmed by Virgel Manifold [phone]) on 07/25/2018 8:23:30 PM   Radiology No results found.   Ct Head Wo Contrast  Result Date: 07/25/2018 CLINICAL DATA:  Altered LOC, lower extremity weakness EXAM: CT HEAD WITHOUT CONTRAST TECHNIQUE: Contiguous axial images were obtained from the base of the skull through the vertex without intravenous contrast. COMPARISON:  CT 08/05/2016 FINDINGS: Brain: No acute territorial infarction, hemorrhage or intracranial mass. Moderate-to-marked atrophy. Moderate small vessel ischemic changes of the white matter. Stable ventricular enlargement Vascular: No hyperdense vessel. Vertebral and carotid vascular calcification Skull: Normal. Negative for fracture or focal lesion. Sinuses/Orbits: No acute finding. Other: None IMPRESSION: 1. No CT evidence for acute intracranial abnormality. 2. Atrophy with small vessel ischemic changes of the white matter. Electronically Signed   By: Donavan Foil M.D.   On: 07/25/2018 22:24    Procedures Procedures (including critical  care time)  Medications Ordered in ED Medications  sodium chloride flush (NS) 0.9 % injection 3 mL (has no administration in time range)     Initial Impression / Assessment and Plan / ED Course  I have reviewed the triage vital signs and the nursing notes.  Pertinent labs & imaging results that were available during my care of the patient were reviewed by me and considered in my medical decision making (see chart for details).    A 60-year-old male with generalized weakness.  Neuro exam is nonfocal.  I do not think that this is a CVA based on his description and his exam.  Noted to be hyponatremic with a sodium of 126.  Last labs for comparison approximately 6 months ago and was 139 at that time.  His past medical history does list hyponatremia.  Patient and wife seem unaware of this though.  I was unable to locate the specifics of this in my brief review of his records.  Certainly could be the cause or contributing to his symptoms.  Also just recently diagnosed with a UTI.  This could be contributing as well although I would not necessarily expect him to get worse after 2 days of medication at this point.  Will repeat.  Will discuss with medicine service admission.  Final Clinical Impressions(s) / ED Diagnoses   Final diagnoses:  Hyponatremia  Generalized weakness    ED Discharge Orders    None       Virgel Manifold, MD 07/31/18 1616

## 2018-07-25 NOTE — ED Notes (Signed)
Pt assisted to side of bed and with urinal use. Repositioned and comfortable at this time.

## 2018-07-25 NOTE — ED Notes (Signed)
[place] (pt Wife) - [phone]. Requesting to be updated in the morning with pt location

## 2018-07-25 NOTE — Telephone Encounter (Signed)
Unable to complete assessment note as Epic down during triage call/documentation.  Pt's wife calling (on [place])  to report pt with increased weakness. States saw Dr. Nani Ravens yesterday, diagnosed with acute cystitis, started on  Bactrim DS. Wife states took 2 tabs yesterday, 1 today.  Wife states weakness this am was at baseline; pt ambulated with walker. States by this afternoon pt requiring assist of 2 to stand, unable to ambulate, using W/C.  States "May be a little slower to respond." Checked BP, at baseline, HR 60. Wife states pt had been weaned off carbidopa. CAlled practice and spoke with [facility]. Advised to call neurologist. Wife aware, states will do so.

## 2018-07-25 NOTE — ED Notes (Addendum)
Note charted in error

## 2018-07-25 NOTE — Telephone Encounter (Incomplete)
   Answer Assessment - Initial Assessment Questions 1. DESCRIPTION: "Describe how you are feeling."     Very weak, can't stand. Needs 2 people to assist 2. SEVERITY: "How bad is it?"  "Can you stand and walk?"   - MILD - Feels weak or tired, but does not interfere with work, school or normal activities   - [place] to stand and walk; weakness interferes with work, school, or normal activities   - SEVERE - Unable to stand or walk     severe 3. ONSET:  "When did the weakness begin?"    Worsened this AM 4. CAUSE: "What do you think is causing the weakness?"    Unsure has UTI 5. MEDICINES: "Have you recently started a new medicine or had a change in the amount of a medicine?"     6. OTHER SYMPTOMS: "Do you have any other symptoms?" (e.g., chest pain, fever, cough, SOB, vomiting, diarrhea, bleeding, other areas of pain)    No  Protocols used: WEAKNESS (GENERALIZED) AND FATIGUE-A-AH

## 2018-07-26 ENCOUNTER — Telehealth: Payer: Self-pay | Admitting: Neurology

## 2018-07-26 DIAGNOSIS — R2689 Other abnormalities of gait and mobility: Secondary | ICD-10-CM | POA: Diagnosis not present

## 2018-07-26 DIAGNOSIS — Z9861 Coronary angioplasty status: Secondary | ICD-10-CM

## 2018-07-26 DIAGNOSIS — R296 Repeated falls: Secondary | ICD-10-CM | POA: Diagnosis not present

## 2018-07-26 DIAGNOSIS — R2681 Unsteadiness on feet: Secondary | ICD-10-CM | POA: Diagnosis not present

## 2018-07-26 DIAGNOSIS — R9431 Abnormal electrocardiogram [ECG] [EKG]: Secondary | ICD-10-CM | POA: Diagnosis not present

## 2018-07-26 DIAGNOSIS — N3001 Acute cystitis with hematuria: Secondary | ICD-10-CM

## 2018-07-26 DIAGNOSIS — G319 Degenerative disease of nervous system, unspecified: Secondary | ICD-10-CM | POA: Diagnosis not present

## 2018-07-26 DIAGNOSIS — I350 Nonrheumatic aortic (valve) stenosis: Secondary | ICD-10-CM | POA: Diagnosis not present

## 2018-07-26 DIAGNOSIS — E871 Hypo-osmolality and hyponatremia: Secondary | ICD-10-CM | POA: Diagnosis not present

## 2018-07-26 DIAGNOSIS — I2510 Atherosclerotic heart disease of native coronary artery without angina pectoris: Secondary | ICD-10-CM | POA: Diagnosis not present

## 2018-07-26 DIAGNOSIS — N390 Urinary tract infection, site not specified: Secondary | ICD-10-CM | POA: Diagnosis not present

## 2018-07-26 DIAGNOSIS — Z96642 Presence of left artificial hip joint: Secondary | ICD-10-CM | POA: Diagnosis not present

## 2018-07-26 DIAGNOSIS — Z7902 Long term (current) use of antithrombotics/antiplatelets: Secondary | ICD-10-CM | POA: Diagnosis not present

## 2018-07-26 DIAGNOSIS — I130 Hypertensive heart and chronic kidney disease with heart failure and stage 1 through stage 4 chronic kidney disease, or unspecified chronic kidney disease: Secondary | ICD-10-CM | POA: Diagnosis not present

## 2018-07-26 DIAGNOSIS — E039 Hypothyroidism, unspecified: Secondary | ICD-10-CM | POA: Diagnosis not present

## 2018-07-26 DIAGNOSIS — I480 Paroxysmal atrial fibrillation: Secondary | ICD-10-CM | POA: Diagnosis not present

## 2018-07-26 DIAGNOSIS — I5042 Chronic combined systolic (congestive) and diastolic (congestive) heart failure: Secondary | ICD-10-CM | POA: Diagnosis not present

## 2018-07-26 DIAGNOSIS — I252 Old myocardial infarction: Secondary | ICD-10-CM | POA: Diagnosis not present

## 2018-07-26 DIAGNOSIS — Z951 Presence of aortocoronary bypass graft: Secondary | ICD-10-CM | POA: Diagnosis not present

## 2018-07-26 DIAGNOSIS — E785 Hyperlipidemia, unspecified: Secondary | ICD-10-CM | POA: Diagnosis not present

## 2018-07-26 DIAGNOSIS — M6281 Muscle weakness (generalized): Secondary | ICD-10-CM | POA: Diagnosis not present

## 2018-07-26 DIAGNOSIS — I509 Heart failure, unspecified: Secondary | ICD-10-CM

## 2018-07-26 DIAGNOSIS — Z87891 Personal history of nicotine dependence: Secondary | ICD-10-CM | POA: Diagnosis not present

## 2018-07-26 DIAGNOSIS — R531 Weakness: Secondary | ICD-10-CM

## 2018-07-26 DIAGNOSIS — B961 Klebsiella pneumoniae [K. pneumoniae] as the cause of diseases classified elsewhere: Secondary | ICD-10-CM | POA: Diagnosis not present

## 2018-07-26 DIAGNOSIS — Z955 Presence of coronary angioplasty implant and graft: Secondary | ICD-10-CM | POA: Diagnosis not present

## 2018-07-26 DIAGNOSIS — N400 Enlarged prostate without lower urinary tract symptoms: Secondary | ICD-10-CM | POA: Diagnosis not present

## 2018-07-26 DIAGNOSIS — I10 Essential (primary) hypertension: Secondary | ICD-10-CM

## 2018-07-26 DIAGNOSIS — N189 Chronic kidney disease, unspecified: Secondary | ICD-10-CM | POA: Diagnosis not present

## 2018-07-26 LAB — BASIC METABOLIC PANEL
Anion gap: 8 (ref 5–15)
BUN: 15 mg/dL (ref 8–23)
CHLORIDE: 102 mmol/L (ref 98–111)
CO2: 23 mmol/L (ref 22–32)
Calcium: 8.6 mg/dL — ABNORMAL LOW (ref 8.9–10.3)
Creatinine, Ser: 1.32 mg/dL — ABNORMAL HIGH (ref 0.61–1.24)
GFR calc Af Amer: 56 mL/min — ABNORMAL LOW (ref >60)
GFR calc non Af Amer: 48 mL/min — ABNORMAL LOW (ref >60)
Glucose, Bld: 94 mg/dL (ref 70–99)
Potassium: 4 mmol/L (ref 3.5–5.1)
SODIUM: 133 mmol/L — AB (ref 135–145)

## 2018-07-26 LAB — URINE CULTURE
MICRO NUMBER: 108822
SPECIMEN QUALITY:: ADEQUATE

## 2018-07-26 LAB — OSMOLALITY, URINE: Osmolality, Ur: 421 mOsm/kg (ref 300–900)

## 2018-07-26 LAB — CBC
HCT: 37.8 % — ABNORMAL LOW (ref 39.0–52.0)
HEMOGLOBIN: 12.5 g/dL — AB (ref 13.0–17.0)
MCH: 28.5 pg (ref 26.0–34.0)
MCHC: 33.1 g/dL (ref 30.0–36.0)
MCV: 86.1 fL (ref 80.0–100.0)
Platelets: 351 10*3/uL (ref 150–400)
RBC: 4.39 MIL/uL (ref 4.22–5.81)
RDW: 15.1 % (ref 11.5–15.5)
WBC: 9.7 10*3/uL (ref 4.0–10.5)
nRBC: 0 % (ref 0.0–0.2)

## 2018-07-26 LAB — NA AND K (SODIUM & POTASSIUM), RAND UR
Potassium Urine: 29 mmol/L
Sodium, Ur: 72 mmol/L

## 2018-07-26 LAB — TSH: TSH: 7.545 u[IU]/mL — ABNORMAL HIGH (ref 0.350–4.500)

## 2018-07-26 LAB — CREATININE, URINE, RANDOM: Creatinine, Urine: 67.02 mg/dL

## 2018-07-26 MED ORDER — SODIUM CHLORIDE 0.9 % IV SOLN
1.0000 g | INTRAVENOUS | Status: DC
  Administered 2018-07-26: 1 g via INTRAVENOUS
  Filled 2018-07-26: qty 10

## 2018-07-26 MED ORDER — LEVOTHYROXINE SODIUM 50 MCG PO TABS
50.0000 ug | ORAL_TABLET | Freq: Every day | ORAL | Status: DC
  Administered 2018-07-26: 50 ug via ORAL
  Filled 2018-07-26: qty 1

## 2018-07-26 MED ORDER — POLYVINYL ALCOHOL 1.4 % OP SOLN
1.0000 [drp] | OPHTHALMIC | Status: DC | PRN
  Filled 2018-07-26: qty 15

## 2018-07-26 MED ORDER — CARBIDOPA-LEVODOPA 25-100 MG PO TABS
1.0000 | ORAL_TABLET | Freq: Every day | ORAL | Status: DC
  Administered 2018-07-26: 1 via ORAL
  Filled 2018-07-26: qty 1

## 2018-07-26 MED ORDER — FINASTERIDE 5 MG PO TABS
5.0000 mg | ORAL_TABLET | Freq: Every morning | ORAL | Status: DC
  Administered 2018-07-26 – 2018-07-27 (×2): 5 mg via ORAL
  Filled 2018-07-26 (×2): qty 1

## 2018-07-26 MED ORDER — AMIODARONE HCL 200 MG PO TABS
200.0000 mg | ORAL_TABLET | Freq: Every day | ORAL | Status: DC
  Administered 2018-07-26 – 2018-07-27 (×2): 200 mg via ORAL
  Filled 2018-07-26 (×2): qty 1

## 2018-07-26 MED ORDER — SULFAMETHOXAZOLE-TRIMETHOPRIM 800-160 MG PO TABS
1.0000 | ORAL_TABLET | Freq: Two times a day (BID) | ORAL | Status: DC
  Administered 2018-07-26: 1 via ORAL
  Filled 2018-07-26: qty 1

## 2018-07-26 MED ORDER — CLOPIDOGREL BISULFATE 75 MG PO TABS
75.0000 mg | ORAL_TABLET | Freq: Every day | ORAL | Status: DC
  Administered 2018-07-26 – 2018-07-27 (×2): 75 mg via ORAL
  Filled 2018-07-26 (×2): qty 1

## 2018-07-26 NOTE — NC FL2 (Signed)
[place] MEDICAID FL2 LEVEL OF CARE SCREENING TOOL     IDENTIFICATION  Patient Name: Justin [name] Birthdate: 1928-12-11 Sex: male Admission Date (Current Location): 07/25/2018  [name] and [place] Number:  Herbalist and Address:  The  [facility] [place], [place] [address], [place], [place] [address]      Provider Number: [id number]  Attending Physician Name and Address:  Norval Morton, MD  Relative Name and Phone Number:       Current Level of Care: Hospital Recommended Level of Care: [place] Prior Approval Number:    Date Approved/Denied:   PASRR Number: [id number]  Discharge Plan: SNF    Current Diagnoses: Patient Active Problem List   Diagnosis Date Noted  . Sundowning 06/24/2017  . Ischemic cardiomyopathy 06/24/2017  . Anticoagulated 06/24/2017  . Syncope   . Sigmoid diverticulitis   . Internal hemorrhoids   . HTN (hypertension)   . Complication of anesthesia   . CHF (congestive heart failure) ([place])   . Basal cell carcinoma   . NSTEMI (non-ST elevated myocardial infarction) ([place])   . Chest pain 06/10/2017  . Unstable angina ([place]) 06/10/2017  . UTI (urinary tract infection) 11/24/2016  . Sepsis ([place]) 11/24/2016  . Generalized weakness 11/24/2016  . Hyponatremia 11/24/2016  . BPH with urinary obstruction 11/04/2016  . H/O prostatectomy 11/04/2016  . Fall   . Closed left hip fracture ([place]) 10/13/2015  . Medicare annual wellness visit, subsequent 03/16/2015  . Paroxysmal atrial fibrillation ([place]) 09/26/2014  . Orthostatic hypotension 09/15/2014  . Head trauma 09/14/2014  . Alteration in anticoagulation 09/14/2014  . Tinnitus 03/21/2014  . Epigastric pain 02/01/2013  . Gait instability 01/01/2013  . Disequilibrium 04/03/2012  . Hyperglycemia 09/13/2011  . Aortic stenosis 03/10/2011  . CAD S/P percutaneous coronary angioplasty 02/23/2010  . Hyperlipidemia 07/04/2008  . Essential hypertension 07/04/2008  .  RHINITIS 07/04/2008  . BPH (benign prostatic hyperplasia) 07/04/2008  . Hx of CABG 06/28/1989    Orientation RESPIRATION BLADDER Height & Weight     Situation, Place, Time, Self  Normal External catheter(placed 07/26/18) Weight: 148 lb 13 oz (67.5 kg) Height:  5\' 5"  (165.1 cm)  BEHAVIORAL SYMPTOMS/MOOD NEUROLOGICAL BOWEL NUTRITION STATUS      Continent Diet(heart healthy, thin liquids)  AMBULATORY STATUS COMMUNICATION OF NEEDS Skin   Limited Assist Verbally Normal                       Personal Care Assistance Level of Assistance  Bathing, Feeding, Dressing Bathing Assistance: Limited assistance Feeding assistance: Independent Dressing Assistance: Limited assistance     Functional Limitations Info  Sight, Hearing, Speech Sight Info: Adequate Hearing Info: Adequate Speech Info: Adequate    SPECIAL CARE FACTORS FREQUENCY  PT (By licensed PT), OT (By licensed OT)     PT Frequency: 2x OT Frequency: 2x            Contractures Contractures Info: Not present    Additional Factors Info  Code Status, Allergies Code Status Info: Full Code Allergies Info: Morphine And Related, Diltiazem Hcl, Metoprolol, Toradol Ketorolac Tromethamine, Hydrocodone, Dutasteride           Current Medications (07/26/2018):  This is the current hospital active medication list Current Facility-Administered Medications  Medication Dose Route Frequency Provider Last Rate Last Dose  . amiodarone (PACERONE) tablet 200 mg  200 mg Oral Daily Fuller Plan A, MD   200 mg at 07/26/18 0807  . cefTRIAXone (ROCEPHIN) 1  g in sodium chloride 0.9 % 100 mL IVPB  1 g Intravenous Q24H Smith, Rondell A, MD      . clopidogrel (PLAVIX) tablet 75 mg  75 mg Oral Daily Tamala Julian, Rondell A, MD   75 mg at 07/26/18 0807  . finasteride (PROSCAR) tablet 5 mg  5 mg Oral q morning - 10a Fuller Plan A, MD   5 mg at 07/26/18 0807  . levothyroxine (SYNTHROID, LEVOTHROID) tablet 50 mcg  50 mcg Oral QAC breakfast Fuller Plan A, MD   50 mcg at 07/26/18 0807  . polyvinyl alcohol (LIQUIFILM TEARS) 1.4 % ophthalmic solution 1 drop  1 drop Both Eyes PRN Norval Morton, MD         Discharge Medications: Please see discharge summary for a list of discharge medications.  Relevant Imaging Results:  Relevant Lab Results:   Additional Information SSN: [id number]  Eileen Stanford, LCSW

## 2018-07-26 NOTE — Telephone Encounter (Signed)
Called and spoke with Justin [name]. I advised her Dr. Tomi Likens is aware of the admission for hyponatremia and that the Pt also has UTI. She will call if she feels the Pt needs to be seen sooner than May once he is discharged from the hospital.

## 2018-07-26 NOTE — Care Management Obs Status (Signed)
[facility] NOTIFICATION   Patient Details  Name: DELSHON BLANCHFIELD MRN: [id number] Date of Birth: September 07, 1928   Medicare Observation Status Notification Given:  Yes    Carles Collet, RN 07/26/2018, 10:24 AM

## 2018-07-26 NOTE — Clinical Social Work Note (Signed)
[employer] auth started for [name].   [name], Justin

## 2018-07-26 NOTE — Progress Notes (Signed)
Patient admitted after midnight.  See full H&P for complete details.  Patient had been on doxycycline for the last 2 days prior to admission after being diagnosed by his primary care provider with a UTI.  Repeat urinalysis revealed large leukocytes with positive nitrites.  Culture from primary care provider had not resulted yet.  Suspect failure of outpatient antibiotics or reaction to medication.  Changed antibiotics to IV Rocephin.  Follow-up cultures tomorrow morning and change to p.o. medication. Sodium levels 126 up to 133 with IV fluids.  TSH noted to be 7.19 on 05/30/2018 and repeat 7.545 on 128/2020.  Wife was able to call patient's pharmacy and noted that patient had been changed from levothyroxine 49mcg to 50 mcg at the end of last year.  Also discussed the need of taking levothyroxine at least 3 hours from any other medications to decrease risks of absorption issues.

## 2018-07-26 NOTE — H&P (Signed)
History and Physical    Justin [name] BJS:[id number] DOB: 07-Jun-1929 DOA: 07/25/2018  PCP: Mosie Lukes, MD  Patient coming from: Home  Chief Complaint: Legs giving out weakness  HPI: Justin [name] is a 85 y.o. male with medical history significant of coronary artery disease, congestive heart failure, hypertension, paroxysmal A. fib presented to the urgent care for generalized weakness mainly with his legs giving out.  He denies any recent illnesses.  No nausea vomiting diarrhea.  Patient had a sodium level that was 126 patient was referred for admission for this being the cause of his legs giving out.  Patient normally walks with a walker.  Patient be referred for admission for hyponatremia.  Review of Systems: As per HPI otherwise 10 point review of systems negative.   Past Medical History:  Diagnosis Date  . Basal cell carcinoma   . BPH (benign prostatic hypertrophy)   . CAD (coronary artery disease) 1991   a. H/o MI w/ PTCA;  b. s/p CABG by Dr Roxan Hockey w/ LIMA-LAD, SVG-OM1-OM3, SVG-D1, SVG-PDA  . CHF (congestive heart failure) ([facility])   . Complication of anesthesia    Wife reports history of confusion after last surgery in 10/2016-lasted a few days  . HTN (hypertension)   . Hyperlipidemia   . Internal hemorrhoids   . Moderate aortic stenosis    a. 08/2014 Echo: EF 55-60%, no rwma, Gr 1 DD, mod AS, mildly dil asc Ao, mild MR, sev dil LA.  . NSTEMI (non-ST elevated myocardial infarction) ([facility]) 05/2017    PCI with DES to SVG-OM1. SVG-OM3 occluded, LIMA-LAD and SVG-RCA patent  . PAF (paroxysmal atrial fibrillation) ([facility])    a. Dx 08/2014;  b. CHA2DS2VASc = 4 - decision made to withold [place] 2/2 unsteady gait and syncope.  . Sigmoid diverticulitis   . Syncope    a. 08/2014 - ? orthostatic    Past Surgical History:  Procedure Laterality Date  . CARDIAC CATHETERIZATION    . COLONOSCOPY    . CORONARY ARTERY BYPASS GRAFT  2000   Dr Roxan Hockey  LIMA-LAD, SVG-OM1-OM3, SVG-D1,  SVG-PDA.   [name] CORONARY STENT INTERVENTION N/A 06/13/2017   Procedure: CORONARY STENT INTERVENTION;  Surgeon: Lorretta Harp, MD;  Location: [facility];  Service: Cardiovascular;  Laterality: N/A;  . CYSTOSCOPY N/A 12/07/2016   Procedure: CYSTOSCOPY;  Surgeon: Cleon Gustin, MD;  Location: [facility];  Service: Urology;  Laterality: N/A;  NEEDS 30 MIN TOTAL FOR SURGERY  . CYSTOSCOPY WITH URETHRAL DILATATION N/A 02/21/2017   Procedure: DILATATION OF BLADDER NECK WITH INJECTION OF MITOMYCIN C;  Surgeon: Cleon Gustin, MD;  Location: [facility];  Service: Urology;  Laterality: N/A;  . EYE SURGERY     bil cataract  . HIP ARTHROPLASTY Left 10/14/2015   Procedure: LEFT HIP HEMI ARTHROPLASTY;  Surgeon: [name], MD;  Location: [facility];  Service: Orthopedics;  Laterality: Left;  . LEFT HEART CATH AND CORS/GRAFTS ANGIOGRAPHY N/A 06/13/2017   Procedure: LEFT HEART CATH AND CORS/GRAFTS ANGIOGRAPHY;  Surgeon: Lorretta Harp, MD;  Location: [facility];  Service: Cardiovascular;  Laterality: N/A;  . SHOULDER SURGERY     x2  . TONSILLECTOMY    . TRANSTHORACIC ECHOCARDIOGRAM  10-06-2016   dr Stanford Breed   mild LVH, ef 40-45%, diffuse hypokinesis/ moderate AV calcification leaflets and annulus,  severe thickened leaflets, mod.-sev. aortic stenosis (Valva area 0.61cm^2, mean grandient 22mmHg, peak gradiant 53mmHg)/  moderate thickened , mild calcified MV leaflets without stenosis,  moderate MR/  severe LAE/ trivial PR and TR  . TRANSURETHRAL RESECTION OF BLADDER NECK N/A 12/07/2016   Procedure: TRANSURETHRAL RESECTION OF BLADDER NECK;  Surgeon: Cleon Gustin, MD;  Location: [facility];  Service: Urology;  Laterality: N/A;  . XI ROBOTIC ASSISTED SIMPLE PROSTATECTOMY N/A 11/04/2016   Procedure: XI ROBOTIC ASSISTED SIMPLE PROSTATECTOMY;  Surgeon: Cleon Gustin, MD;  Location: [facility];  Service: Urology;  Laterality: N/A;     reports that he quit smoking about 47 years ago. He has a 7.50  pack-year smoking history. He has never used smokeless tobacco. He reports current alcohol use of about 1.0 - 2.0 standard drinks of alcohol per week. He reports that he does not use drugs.  Allergies  Allergen Reactions  . Morphine And Related Other (See Comments)    Severe confusion  . Diltiazem Hcl     bradycardia  . Metoprolol     bradycardia  . Toradol [Ketorolac Tromethamine] Other (See Comments)    Dizzy and light headed  . Hydrocodone Other (See Comments)    hallucinations  . Dutasteride Diarrhea    GI upset and incontinence    Family History  Problem Relation Age of Onset  . Coronary artery disease Father   . Hyperlipidemia Father   . Heart attack Father   . Alcohol abuse Father   . Bipolar disorder Mother   . Heart disease Mother   . Alcohol abuse Brother   . Cancer Brother   . Cancer Maternal Grandfather        oral cancer, chew tobacco  . Cancer Sister   . Glaucoma Sister   . Alcohol abuse Other     Prior to Admission medications   Medication Sig Start Date End Date Taking? Authorizing Provider  beta carotene w/minerals (OCUVITE) tablet Take 1 tablet by mouth daily.   Yes [provider]  clopidogrel (PLAVIX) 75 MG tablet Take 1 tablet (75 mg total) by mouth daily with breakfast. 07/10/18  Yes Crenshaw, Denice Bors, MD  acetaminophen (TYLENOL) 500 MG tablet Take 500 mg by mouth daily as needed for moderate pain.    [provider]  amiodarone (PACERONE) 200 MG tablet Take 200 mg by mouth daily.    [provider]  amLODipine (NORVASC) 2.5 MG tablet Take 1 tablet (2.5 mg total) by mouth daily. 03/27/18   Lelon Perla, MD  carbidopa-levodopa (SINEMET IR) 25-100 MG tablet Take 1 tablet by mouth daily.    [provider]  Carboxymethylcellulose Sodium (THERATEARS OP) Apply 1 drop to eye daily as needed (dry eyes).    [provider]  docusate sodium (COLACE) 100 MG capsule Take 100 mg by mouth every morning.     [provider]  ferrous sulfate 325 (65 FE) MG tablet Take 325 mg by mouth daily with breakfast.     [provider]  finasteride (PROSCAR) 5 MG tablet Take 5 mg by mouth every morning.     [provider]  levothyroxine (SYNTHROID, LEVOTHROID) 50 MCG tablet Take 1 tablet (50 mcg total) by mouth daily before breakfast. 03/22/18   Lelon Perla, MD  lovastatin (MEVACOR) 20 MG tablet Take 1 tablet (20 mg total) by mouth daily at 6 PM. 03/03/18   Crenshaw, Denice Bors, MD  meclizine (ANTIVERT) 25 MG tablet Take 1 tablet (25 mg total) by mouth 3 (three) times daily as needed for dizziness. 08/05/16   Fredia Sorrow, MD  Multiple Vitamin (MULTIVITAMIN WITH MINERALS) TABS  tablet Take 1 tablet by mouth daily.    [provider]  nitroGLYCERIN (NITROSTAT) 0.4 MG SL tablet Place 1 tablet (0.4 mg total) under the tongue every 5 (five) minutes as needed for chest pain. 06/14/18   Lelon Perla, MD  Probiotic Product (PROBIOTIC DAILY PO) Take 1 capsule by mouth daily.    [provider]  sulfamethoxazole-trimethoprim (BACTRIM DS,SEPTRA DS) 800-160 MG tablet Take 1 tablet by mouth 2 (two) times daily for 7 days. 07/24/18 07/31/18  Shelda Pal, DO    Physical Exam: Vitals:   07/25/18 2008 07/25/18 2313 07/26/18 0024 07/26/18 0315  BP: (!) 158/76 130/83 (!) 156/78 (!) 150/78  Pulse: 72 60 65 60  Resp: 17 11 14 18   Temp: 97.7 F (36.5 C)   98.1 F (36.7 C)  TempSrc: Oral   Oral  SpO2: 99% 97% 99% 95%  Weight:      Height:          Constitutional: NAD, calm, comfortable Vitals:   07/25/18 2008 07/25/18 2313 07/26/18 0024 07/26/18 0315  BP: (!) 158/76 130/83 (!) 156/78 (!) 150/78  Pulse: 72 60 65 60  Resp: 17 11 14 18   Temp: 97.7 F (36.5 C)   98.1 F (36.7 C)  TempSrc: Oral   Oral  SpO2: 99% 97% 99% 95%  Weight:      Height:       Eyes: PERRL, lids and conjunctivae normal ENMT: Mucous membranes are moist. Posterior pharynx clear of any exudate  or lesions.Normal dentition.  Neck: normal, supple, no masses, no thyromegaly Respiratory: clear to auscultation bilaterally, no wheezing, no crackles. Normal respiratory effort. No accessory muscle use.  Cardiovascular: Regular rate and rhythm, no murmurs / rubs / gallops. No extremity edema. 2+ pedal pulses. No carotid bruits.  Abdomen: no tenderness, no masses palpated. No hepatosplenomegaly. Bowel sounds positive.  Musculoskeletal: no clubbing / cyanosis. No joint deformity upper and lower extremities. Good ROM, no contractures. Normal muscle tone.  Skin: no rashes, lesions, ulcers. No induration Neurologic: CN 2-12 grossly intact. Sensation intact, DTR normal. Strength 5/5 in all 4.  Psychiatric: Normal judgment and insight. Alert and oriented x 3. Normal mood.    Labs on Admission: I have personally reviewed following labs and imaging studies  CBC: Recent Labs  Lab 07/25/18 2008  WBC 9.8  HGB 13.6  HCT 41.8  MCV 88.4  PLT 268   Basic Metabolic Panel: Recent Labs  Lab 07/25/18 2008 07/25/18 2200  NA 126*  --   K 5.0  --   CL 97*  --   CO2 21*  --   GLUCOSE 108*  --   BUN 23  --   CREATININE 1.60*  --   CALCIUM 8.8*  --   MG  --  2.0  PHOS  --  3.3   GFR: Estimated Creatinine Clearance: 28.3 mL/min (A) (by C-G formula based on SCr of 1.6 mg/dL (H)). Liver Function Tests: No results for input(s): AST, ALT, ALKPHOS, BILITOT, PROT, ALBUMIN in the last 168 hours. No results for input(s): LIPASE, AMYLASE in the last 168 hours. No results for input(s): AMMONIA in the last 168 hours. Coagulation Profile: No results for input(s): INR, PROTIME in the last 168 hours. Cardiac Enzymes: No results for input(s): CKTOTAL, CKMB, CKMBINDEX, TROPONINI in the last 168 hours. BNP (last 3 results) No results for input(s): PROBNP in the last 8760 hours. HbA1C: No results for input(s): HGBA1C in the last 72 hours. CBG: Recent Labs  Lab 07/25/18 2009  GLUCAP 103*   Lipid  Profile: No results for input(s): CHOL, HDL, LDLCALC, TRIG, CHOLHDL, LDLDIRECT in the last 72 hours. Thyroid Function Tests: Recent Labs    07/25/18 2118  TSH 7.545*   Anemia Panel: No results for input(s): VITAMINB12, FOLATE, FERRITIN, TIBC, IRON, RETICCTPCT in the last 72 hours. Urine analysis:    Component Value Date/Time   COLORURINE YELLOW 07/25/2018 2332   APPEARANCEUR CLOUDY (A) 07/25/2018 2332   LABSPEC 1.015 07/25/2018 2332   PHURINE 6.0 07/25/2018 2332   GLUCOSEU NEGATIVE 07/25/2018 2332   GLUCOSEU NEG mg/dL 02/16/2010 0000   HGBUR TRACE (A) 07/25/2018 2332   BILIRUBINUR NEGATIVE 07/25/2018 2332   BILIRUBINUR negative 07/24/2018 1502   KETONESUR NEGATIVE 07/25/2018 2332   PROTEINUR NEGATIVE 07/25/2018 2332   UROBILINOGEN negative (A) 07/24/2018 1502   UROBILINOGEN 0.2 09/13/2014 1634   NITRITE POSITIVE (A) 07/25/2018 2332   LEUKOCYTESUR LARGE (A) 07/25/2018 2332   Sepsis Labs: !!!!!!!!!!!!!!!!!!!!!!!!!!!!!!!!!!!!!!!!!!!! @LABRCNTIP (procalcitonin:4,lacticidven:4) )No results found for this or any previous visit (from the past 240 hour(s)).   Radiological Exams on Admission: Ct Head Wo Contrast  Result Date: 07/25/2018 CLINICAL DATA:  Altered LOC, lower extremity weakness EXAM: CT HEAD WITHOUT CONTRAST TECHNIQUE: Contiguous axial images were obtained from the base of the skull through the vertex without intravenous contrast. COMPARISON:  CT 08/05/2016 FINDINGS: Brain: No acute territorial infarction, hemorrhage or intracranial mass. Moderate-to-marked atrophy. Moderate small vessel ischemic changes of the white matter. Stable ventricular enlargement Vascular: No hyperdense vessel. Vertebral and carotid vascular calcification Skull: Normal. Negative for fracture or focal lesion. Sinuses/Orbits: No acute finding. Other: None IMPRESSION: 1. No CT evidence for acute intracranial abnormality. 2. Atrophy with small vessel ischemic changes of the white matter. Electronically  Signed   By: Donavan Foil M.D.   On: 07/25/2018 22:24   Old chart reviewed   Assessment/Plan 85 year old male with weakness and hyponatremia Principal Problem:   Hyponatremia-unclear if it is related to his weakness.  Does not seem to be low enough.  Placed on normal saline repeat sodium level in the morning.  Obtain physical therapy evaluation.  Active Problems:   Generalized weakness-as above IV fluids physical therapy evaluation    Essential hypertension-clarify resume home meds    CAD S/P percutaneous coronary angioplasty-noted    Aortic stenosis-stable    Gait instability-physical therapy    UTI (urinary tract infection)-continue his Bactrim    CHF (congestive heart failure) (HCC)-compensated this time  Chronic kidney disease-at baseline status of creatinine around 1.5    Med rec pending pharmacy review   DVT prophylaxis: SCDs Code Status: Full Family Communication: None Disposition Plan: 24 hours Consults called: None Admission status: Observation   Adilyn Humes A MD [facility]  If 7PM-7AM, please contact night-coverage [email or web address] Password [facility]  07/26/2018, 3:58 AM

## 2018-07-26 NOTE — Telephone Encounter (Signed)
Patient's wife called and is needing to let Dr. Tomi Likens know that he is has been very weak and unable to walk. In November he stopped taking his Medication gradually. She said at Christmas time he was off completely and he has been really weak. Please Call. Thanks

## 2018-07-26 NOTE — ED Notes (Signed)
Pt resting on stretcher. No complaints/concerns at this time. No pain.

## 2018-07-26 NOTE — Plan of Care (Signed)
  Problem: Clinical Measurements: Goal: Will remain free from infection Outcome: Progressing   Problem: Education: Goal: Knowledge of General Education information will improve Description: Including pain rating scale, medication(s)/side effects and non-pharmacologic comfort measures Outcome: Progressing   

## 2018-07-26 NOTE — Evaluation (Signed)
Physical Therapy Evaluation Patient Details Name: Justin [name] MRN: [id number] DOB: 1928-07-17 Today's Date: 07/26/2018   History of Present Illness  Pt is an 85 y/o male with a PMH significant for coronary artery disease s/p CABG 2000, congestive heart failure, hypertension, paroxysmal A. fib. Pt presented to the urgent care for generalized weakness, mainly for LE's giving out. Pt was referred for admission and found to have UTI and hyponatremia.   Clinical Impression  Pt admitted with above diagnosis. Pt currently with functional limitations due to the deficits listed below (see PT Problem List). At the time of PT eval pt was able to perform transfers and ambulation with up to heavy mod assist for balance support and safety with RW use. Pt moving slow with decreased tolerance for functional activity. Discussed rehab options with pt and wife and they would like to return to [facility] and use pt's allotted SNF days to maximize functional independence prior to return home with wife. Pt will benefit from skilled PT to increase their independence and safety with mobility to allow discharge to the venue listed below.       Follow Up Recommendations SNF;Supervision/Assistance - 24 hour    Equipment Recommendations  None recommended by PT    Recommendations for Other Services       Precautions / Restrictions Precautions Precautions: Fall Precaution Comments: Has had several falls. Last fall about 6 months ago. Restrictions Weight Bearing Restrictions: No      Mobility  Bed Mobility Overal bed mobility: Needs Assistance Bed Mobility: Supine to Sit     Supine to sit: Supervision     General bed mobility comments: Increased time and effort to transition to EOB. HOB flat and rails lowered to simulate home environment. No assist required.   Transfers Overall transfer level: Needs assistance Equipment used: Rolling walker (2 wheeled) Transfers: Sit to/from Stand Sit to Stand: Mod  assist         General transfer comment: Heavy mod A to power-up to full stand from EOB and low toilet height. VC's for hand placement on seated surface for safety.   Ambulation/Gait Ambulation/Gait assistance: Min assist Gait Distance (Feet): 30 Feet Assistive device: Rolling walker (2 wheeled) Gait Pattern/deviations: Decreased stride length;Shuffle;Trunk flexed Gait velocity: Decreased Gait velocity interpretation: <1.31 ft/sec, indicative of household ambulator General Gait Details: Shuffling gait pattern. Requires assistance for walker management and frequent cues for improved posture and closer walker proximity. Occasionally pt unsafely reaching out for external objects for support instead of holding to the walker.   Stairs            Wheelchair Mobility    Modified Rankin (Stroke Patients Only)       Balance Overall balance assessment: Needs assistance Sitting-balance support: Feet supported;No upper extremity supported Sitting balance-Leahy Scale: Fair     Standing balance support: Bilateral upper extremity supported;During functional activity Standing balance-Leahy Scale: Poor                               Pertinent Vitals/Pain Pain Assessment: No/denies pain    Home Living Family/patient expects to be discharged to:: Skilled nursing facility                 Additional Comments: Pt and wife live in an apartment at [facility]. Has 2 weeks of SNF days/year.     Prior Function Level of Independence: Needs assistance   Gait / Transfers Assistance Needed:  Using either the rollator or the RW.   ADL's / Homemaking Assistance Needed: Wife provides supervision for showers and set-up for dressing. They have a PCA coming in from [facility] to provide supervision in the mornings 4 days/week while wife is out for her medical needs.         Hand Dominance        Extremity/Trunk Assessment   Upper Extremity Assessment Upper Extremity  Assessment: Defer to OT evaluation    Lower Extremity Assessment Lower Extremity Assessment: Generalized weakness    Cervical / Trunk Assessment Cervical / Trunk Assessment: Other exceptions Cervical / Trunk Exceptions: Forward head posture with rounded shoulders  Communication   Communication: No difficulties  Cognition Arousal/Alertness: Awake/alert Behavior During Therapy: WFL for tasks assessed/performed Overall Cognitive Status: Within Functional Limits for tasks assessed                                        General Comments      Exercises     Assessment/Plan    PT Assessment Patient needs continued PT services  PT Problem List Decreased strength;Decreased activity tolerance;Decreased balance;Decreased mobility;Decreased knowledge of use of DME;Decreased safety awareness;Decreased knowledge of precautions       PT Treatment Interventions DME instruction;Gait training;Therapeutic activities;Functional mobility training;Therapeutic exercise;Neuromuscular re-education;Patient/family education    PT Goals (Current goals can be found in the Care Plan section)  Acute Rehab PT Goals Patient Stated Goal: No more falls PT Goal Formulation: With patient/family Time For Goal Achievement: 08/09/18 Potential to Achieve Goals: Good    Frequency Min 2X/week   Barriers to discharge Decreased caregiver support Wife not able to provide current level of physical assistance.     Co-evaluation               AM-PAC PT "6 Clicks" Mobility  Outcome Measure Help needed turning from your back to your side while in a flat bed without using bedrails?: None Help needed moving from lying on your back to sitting on the side of a flat bed without using bedrails?: A Little Help needed moving to and from a bed to a chair (including a wheelchair)?: A Lot Help needed standing up from a chair using your arms (e.g., wheelchair or bedside chair)?: A Lot Help needed to walk  in hospital room?: A Little Help needed climbing 3-5 steps with a railing? : Total 6 Click Score: 15    End of Session Equipment Utilized During Treatment: Gait belt Activity Tolerance: Patient limited by fatigue Patient left: in chair;with call bell/phone within reach;with family/visitor present Nurse Communication: Mobility status PT Visit Diagnosis: Unsteadiness on feet (R26.81);Repeated falls (R29.6);Muscle weakness (generalized) (M62.81)    Time: 0349-1791 PT Time Calculation (min) (ACUTE ONLY): 34 min   Charges:   PT Evaluation $PT Eval Moderate Complexity: 1 Mod PT Treatments $Gait Training: 8-22 mins        Rolinda Roan, PT, DPT Acute Rehabilitation Services Pager: [phone] Office: [phone]   Thelma Comp 07/26/2018, 8:49 AM

## 2018-07-27 DIAGNOSIS — I10 Essential (primary) hypertension: Secondary | ICD-10-CM | POA: Diagnosis not present

## 2018-07-27 DIAGNOSIS — I110 Hypertensive heart disease with heart failure: Secondary | ICD-10-CM | POA: Diagnosis not present

## 2018-07-27 DIAGNOSIS — B961 Klebsiella pneumoniae [K. pneumoniae] as the cause of diseases classified elsewhere: Secondary | ICD-10-CM | POA: Diagnosis not present

## 2018-07-27 DIAGNOSIS — E039 Hypothyroidism, unspecified: Secondary | ICD-10-CM | POA: Diagnosis not present

## 2018-07-27 DIAGNOSIS — N3001 Acute cystitis with hematuria: Secondary | ICD-10-CM | POA: Diagnosis not present

## 2018-07-27 DIAGNOSIS — E871 Hypo-osmolality and hyponatremia: Secondary | ICD-10-CM | POA: Diagnosis not present

## 2018-07-27 DIAGNOSIS — I2510 Atherosclerotic heart disease of native coronary artery without angina pectoris: Secondary | ICD-10-CM | POA: Diagnosis not present

## 2018-07-27 DIAGNOSIS — R531 Weakness: Secondary | ICD-10-CM | POA: Diagnosis not present

## 2018-07-27 DIAGNOSIS — R2681 Unsteadiness on feet: Secondary | ICD-10-CM | POA: Diagnosis not present

## 2018-07-27 DIAGNOSIS — I5040 Unspecified combined systolic (congestive) and diastolic (congestive) heart failure: Secondary | ICD-10-CM

## 2018-07-27 DIAGNOSIS — Z9861 Coronary angioplasty status: Secondary | ICD-10-CM | POA: Diagnosis not present

## 2018-07-27 DIAGNOSIS — I480 Paroxysmal atrial fibrillation: Secondary | ICD-10-CM | POA: Diagnosis not present

## 2018-07-27 DIAGNOSIS — M6281 Muscle weakness (generalized): Secondary | ICD-10-CM | POA: Diagnosis not present

## 2018-07-27 DIAGNOSIS — I5042 Chronic combined systolic (congestive) and diastolic (congestive) heart failure: Secondary | ICD-10-CM | POA: Diagnosis not present

## 2018-07-27 DIAGNOSIS — N3000 Acute cystitis without hematuria: Secondary | ICD-10-CM | POA: Diagnosis not present

## 2018-07-27 DIAGNOSIS — I350 Nonrheumatic aortic (valve) stenosis: Secondary | ICD-10-CM | POA: Diagnosis not present

## 2018-07-27 DIAGNOSIS — N390 Urinary tract infection, site not specified: Secondary | ICD-10-CM | POA: Diagnosis not present

## 2018-07-27 LAB — BASIC METABOLIC PANEL
Anion gap: 6 (ref 5–15)
BUN: 10 mg/dL (ref 8–23)
CALCIUM: 8.5 mg/dL — AB (ref 8.9–10.3)
CO2: 26 mmol/L (ref 22–32)
Chloride: 101 mmol/L (ref 98–111)
Creatinine, Ser: 1.26 mg/dL — ABNORMAL HIGH (ref 0.61–1.24)
GFR, EST AFRICAN AMERICAN: 59 mL/min — AB (ref >60)
GFR, EST NON AFRICAN AMERICAN: 51 mL/min — AB (ref >60)
Glucose, Bld: 97 mg/dL (ref 70–99)
Potassium: 4.1 mmol/L (ref 3.5–5.1)
Sodium: 133 mmol/L — ABNORMAL LOW (ref 135–145)

## 2018-07-27 LAB — URINE CULTURE: Culture: <10000 — AB

## 2018-07-27 MED ORDER — LEVOTHYROXINE SODIUM 75 MCG PO TABS
75.0000 ug | ORAL_TABLET | Freq: Every day | ORAL | Status: DC
  Administered 2018-07-27: 75 ug via ORAL
  Filled 2018-07-27: qty 1

## 2018-07-27 MED ORDER — LEVOTHYROXINE SODIUM 75 MCG PO TABS: 75.0000 ug | ORAL_TABLET | Freq: Every day | ORAL | 0 refills | Status: DC

## 2018-07-27 MED ORDER — CEFDINIR 300 MG PO CAPS: 300.0000 mg | ORAL_CAPSULE | Freq: Two times a day (BID) | ORAL | 0 refills | Status: DC

## 2018-07-27 MED ORDER — CEFDINIR 300 MG PO CAPS
300.0000 mg | ORAL_CAPSULE | Freq: Two times a day (BID) | ORAL | Status: DC
  Administered 2018-07-27: 300 mg via ORAL
  Filled 2018-07-27: qty 1

## 2018-07-27 NOTE — Clinical Social Work Note (Signed)
Clinical Social Work Assessment  Patient Details  Name: Justin [name] MRN: [id number] Date of Birth: 08/22/1928  Date of referral:  07/27/18               Reason for consult:  Facility Placement                Permission sought to share information with:  Family Supports, Chartered certified accountant granted to share information::  Yes, Verbal Permission Granted  Name::     [employer]::  [name]  Relationship::  spouse  Contact Information:     Housing/Transportation Living arrangements for the past 2 months:  Charity fundraiser of Information:  Patient, Spouse Patient Interpreter Needed:  None Criminal Activity/Legal Involvement Pertinent to Current Situation/Hospitalization:  No - Comment as needed Significant Relationships:  Spouse Lives with:  Spouse Do you feel safe going back to the place where you live?  No Need for family participation in patient care:  No (Coment)  Care giving concerns:  Pt is alert and oriented. Pt's spouse present at bedside.   Social Worker assessment / plan:  Pt is from [place] with spouse. Pt confirms he wants to go to SNF at [name]. Insurance auth obtained and facility prepared to take pt.  Employment status:  Retired Nurse, adult PT Recommendations:  [place] / Referral to community resources:  [place]  Patient/Family's Response to care:  Pt verbalized understanding of CSW role and expressed appreciation for support. Pt denies any concern regarding pt care at this time.   Patient/Family's Understanding of and Emotional Response to Diagnosis, Current Treatment, and Prognosis:  Pt understanding and realistic regarding physical limitations. Pt understands the need for SNF placement at d/c. Pt agreeable to SNF placement at d/c, at this time. Pt's responses emotionally appropriate during conversation with CSW. Pt denies any  concern regarding treatment plan at this time. CSW will continue to provide support and facilitate d/c needs.   Emotional Assessment Appearance:  Appears stated age Attitude/Demeanor/Rapport:  (Patient was appropriate) Affect (typically observed):  Accepting, Appropriate, Calm Orientation:  Oriented to Self, Oriented to Place, Oriented to  Time, Oriented to Situation Alcohol / Substance use:  Not Applicable Psych involvement (Current and /or in the community):  No (Comment)  Discharge Needs  Concerns to be addressed:  Care Coordination, Basic Needs Readmission within the last 30 days:  No Current discharge risk:  Dependent with Mobility Barriers to Discharge:  No Barriers Identified   Kervens Roper A Dinero Chavira, LCSW 07/27/2018, 11:41 AM

## 2018-07-27 NOTE — Progress Notes (Signed)
Pt discharge education and instructions completed. Pt discharge [facility] and report called off to nurse [place] at the facility. Pt IV and telemetry removed and pt to be transported by spouse to the facility. Pt transported off unit via wheelchair with belongings and spouse to the side. Delia Heady RN

## 2018-07-27 NOTE — Discharge Summary (Signed)
Justin [name], is a 85 y.o. male  DOB December 19, 1928  MRN [id number].  Admission date:  07/25/2018  Admitting Physician  Rise Patience, MD  Discharge Date:  07/27/2018   Primary MD  Mosie Lukes, MD  Recommendations for primary care physician for things to follow:   Will likely need repeat CBC and BMP within 1 week We will need repeat thyroid function studies within 6 to 8 weeks   Discharge Diagnosis   Principal Problem:   Hyponatremia Active Problems:   Essential hypertension   CAD S/P percutaneous coronary angioplasty   Aortic stenosis   Gait instability   UTI (urinary tract infection)   Generalized weakness   CHF (congestive heart failure) (HCC)      Past Medical History:  Diagnosis Date  . Basal cell carcinoma   . BPH (benign prostatic hypertrophy)   . CAD (coronary artery disease) 1991   a. H/o MI w/ PTCA;  b. s/p CABG by Dr Roxan Hockey w/ LIMA-LAD, SVG-OM1-OM3, SVG-D1, SVG-PDA  . CHF (congestive heart failure) ([place])   . Complication of anesthesia    Wife reports history of confusion after last surgery in 10/2016-lasted a few days  . HTN (hypertension)   . Hyperlipidemia   . Internal hemorrhoids   . Moderate aortic stenosis    a. 08/2014 Echo: EF 55-60%, no rwma, Gr 1 DD, mod AS, mildly dil asc Ao, mild MR, sev dil LA.  . NSTEMI (non-ST elevated myocardial infarction) ([place]) 05/2017    PCI with DES to SVG-OM1. SVG-OM3 occluded, LIMA-LAD and SVG-RCA patent  . PAF (paroxysmal atrial fibrillation) ([place])    a. Dx 08/2014;  b. CHA2DS2VASc = 4 - decision made to withold [place] 2/2 unsteady gait and syncope.  . Sigmoid diverticulitis   . Syncope    a. 08/2014 - ? orthostatic    Past Surgical History:  Procedure Laterality Date  . CARDIAC CATHETERIZATION    . COLONOSCOPY    . CORONARY ARTERY BYPASS GRAFT   2000   Dr Roxan Hockey  LIMA-LAD, SVG-OM1-OM3, SVG-D1, SVG-PDA.   [name] CORONARY STENT INTERVENTION N/A 06/13/2017   Procedure: CORONARY STENT INTERVENTION;  Surgeon: Lorretta Harp, MD;  Location: [facility];  Service: Cardiovascular;  Laterality: N/A;  . CYSTOSCOPY N/A 12/07/2016   Procedure: CYSTOSCOPY;  Surgeon: Cleon Gustin, MD;  Location: [facility];  Service: Urology;  Laterality: N/A;  NEEDS 30 MIN TOTAL FOR SURGERY  . CYSTOSCOPY WITH URETHRAL DILATATION N/A 02/21/2017   Procedure: DILATATION OF BLADDER NECK WITH INJECTION OF MITOMYCIN C;  Surgeon: Cleon Gustin, MD;  Location: [facility];  Service: Urology;  Laterality: N/A;  . EYE SURGERY     bil cataract  . HIP ARTHROPLASTY Left 10/14/2015   Procedure: LEFT HIP HEMI ARTHROPLASTY;  Surgeon: [name], MD;  Location: [facility];  Service: Orthopedics;  Laterality: Left;  . LEFT HEART CATH AND CORS/GRAFTS ANGIOGRAPHY N/A 06/13/2017   Procedure: LEFT HEART CATH AND CORS/GRAFTS ANGIOGRAPHY;  Surgeon: Lorretta Harp, MD;  Location: [facility];  Service: Cardiovascular;  Laterality: N/A;  . SHOULDER SURGERY     x2  . TONSILLECTOMY    . TRANSTHORACIC ECHOCARDIOGRAM  10-06-2016   dr Stanford Breed   mild LVH, ef 40-45%, diffuse hypokinesis/ moderate AV calcification leaflets and annulus,  severe thickened leaflets, mod.-sev. aortic stenosis (Valva area 0.61cm^2, mean grandient 10mmHg, peak gradiant 26mmHg)/  moderate thickened , mild calcified MV leaflets without stenosis, moderate MR/  severe LAE/ trivial PR and TR  . TRANSURETHRAL RESECTION OF BLADDER NECK N/A 12/07/2016   Procedure: TRANSURETHRAL RESECTION OF BLADDER NECK;  Surgeon: Cleon Gustin, MD;  [facility]: [facility];  Service: Urology;  Laterality: N/A;  . XI ROBOTIC ASSISTED SIMPLE PROSTATECTOMY N/A 11/04/2016   Procedure: XI ROBOTIC ASSISTED SIMPLE PROSTATECTOMY;  Surgeon: Cleon Gustin, MD;  Location: [facility];  Service: Urology;  Laterality: N/A;       HPI   from the history and physical done on the day of admission:  Justin [name] is a 85 y.o. male with medical history significant of coronary artery disease, congestive heart failure, hypertension, paroxysmal A. fib presented to the urgent care for generalized weakness mainly with his legs giving out.  He denies any recent illnesses.  No nausea vomiting diarrhea.  Patient had a sodium level that was 126 patient was referred for admission for this being the cause of his legs giving out.  Patient normally walks with a walker.  Patient be referred for admission for hyponatremia.   Hospital Course:   1. Urinary tract infection: Patient had been on doxycycline for the last 2 days prior to admission after being diagnosed by his primary care provider with a UTI.  Repeat urinalysis revealed large leukocytes with positive nitrites.    signs of Suspect failure of outpatient antibiotics or reaction to medication. Changed antibiotics to IV Rocephin.  Urine culture that had obtained was positive for Klebsiella pneumoniae with resistance to ampicillin. Pateint was switched to Omnicef 300 mg p.o. twice daily to complete 3-day course.  2.  Hyponatremia: Resolving. Sodium levels 126 up to 133 with IV fluids.  Urine studies obtained after patient had been on IV fluids.  Urine osmolarity 421, urine potassium 29, urine sodium 72, and urine creatinine 67.02 suspect symptoms likely related with Bactrim which has been known to cause hyponatremia and have diuretic properties.  3.  Generalized weakness and gait instability: Resolving.  Patient was noted to have signs of dehydration as likely cause of symptoms.  CT scan of the brain showed no acute abnormalities.  Patient felt significantly better after being given IV fluids.  Social work help arrange for the patient to go to [name] skilled nursing facility  3.  Hypothyroidism: Acute on chronic.  TSH 7.19 and free T4 1.89 on 05/30/2018, and repeat TSH 7.545 on 07/25/2018.  Wife  was able to call patient's pharmacy and noted that patient had been changed from levothyroxine 75 mcg to 50 mcg at the end of last year.  Also discussed the need of taking  levothyroxine at least 3 hours from any other medications to decrease risks of absorption issues.  Discussed need to changed back to levothyroxine 75 mcg daily.  May need some alternating dose of the medication to achieve improvement  4.  Paroxysmal atrial fibrillation: Chronic.  Patient was found to be in sinus rhythm and currently rate controlled.  He was continued on amiodarone.  He had been previously taken off of anticoagulation and placed on just Plavix.  5.  CAD s/p percutaneous coronary angioplasty: He was continued on Plavix  6.  History of aortic stenosis, combined systolic and diastolic congestive heart failure: Chronic.  Patient appeared to be hypovolemic on admission.  Last echocardiogram was in 05/2018 showing EF noted to be 40 to 45% with hypokinesis of the anterior myocardium with grade 1 diastolic dysfunction, and moderate to severe aortic stenosis.  He is followed in the outpatient setting by Dr. Stanford Breed of cardiology.  Follow UP - PCP/skilled nursing facility doctor in 1 week    Consults obtained: None  Discharge Condition: Stable  Diet and Activity recommendation: See Discharge Instructions below  Discharge Instructions    Diet - low sodium heart healthy   Complete by:  As directed    Discharge instructions   Complete by:  As directed    The low sodium levels may have been related with Bactrim.  This is not an allergy, but you may want to avoid this medication in the future as you had problems while taking it.  You are being treated for a urinary tract infection that grew out a specific bacteria known as Klebsiella pneumonia.  We switched you to to a medication known as Omnicef.  Please take this medication as advised and keep yourself adequately hydrated while doing so.  Follow with Primary MD Mosie Lukes, MD or skilled nursing facility physician in 7 days.  Get CBC and BMP-  checked  by Primary MD or SNF MD in 5-7 days ( we routinely change or add medications that can affect your baseline labs and fluid status, therefore we recommend that you get the mentioned basic workup next visit with your PCP, your PCP may decide not to get them or add new tests based on their clinical decision)  Activity: As tolerated with fall precautions use walker for assistance as needed  Disposition: [name]  Diet: Heart Healthy   Special Instructions: If you have smoked or chewed Tobacco  in the last 2 yrs please stop smoking, stop any regular Alcohol  and or any Recreational drug use.  On your next visit with your primary care physician please Get Medicines reviewed and adjusted.  Please request your Mosie Lukes, MD to go over all Hospital Tests and Procedure/Radiological results at the follow up, please get all Hospital records sent to your Prim MD by signing hospital release before you go home.  If you experience worsening of your admission symptoms, develop shortness of breath, life threatening emergency, suicidal or homicidal thoughts you must seek medical attention immediately by calling 911 or calling your MD immediately  if symptoms less severe.  You Must read complete instructions/literature along with all the possible adverse reactions/side effects for all the Medicines you take and that have been prescribed to you. Take any new Medicines after you have completely understood and accpet all the possible adverse reactions/side effects.   Do not drive, operate heavy machinery, perform activities at heights, swimming or participation in water activities or provide baby sitting services if your  were admitted for syncope or siezures until you have seen by Primary MD or a Neurologist and advised to do so again.  Do not drive when taking Pain medications.  Do not take more than prescribed  Pain, Sleep and Anxiety Medications  Wear Seat belts while driving.   Please note  You were cared for by a hospitalist during your hospital stay. If you have any questions about your discharge medications or the care you received while you were in the hospital after you are discharged, you can call the unit and asked to speak with the hospitalist on call if the hospitalist that took care of you is not available. Once you are discharged, your primary care physician will handle any further medical issues. Please note that NO REFILLS for any discharge medications will be authorized once you are discharged, as it is imperative that you return to your primary care physician (or establish a relationship with a primary care physician if you do not have one) for your aftercare needs so that they can reassess your need for medications and monitor your lab values.   Increase activity slowly   Complete by:  As directed         Discharge Medications     Allergies as of 07/27/2018      Reactions   Morphine And Related Other (See Comments)   Severe confusion   Diltiazem Hcl    bradycardia   Metoprolol    bradycardia   Toradol [ketorolac Tromethamine] Other (See Comments)   Dizzy and light headed   Hydrocodone Other (See Comments)   hallucinations   Dutasteride Diarrhea   GI upset and incontinence      Medication List    STOP taking these medications   sulfamethoxazole-trimethoprim 800-160 MG tablet Commonly known as:  BACTRIM DS,SEPTRA DS     TAKE these medications   acetaminophen 500 MG tablet Commonly known as:  TYLENOL Take 500 mg by mouth daily as needed for moderate pain.   amiodarone 200 MG tablet Commonly known as:  PACERONE Take 200 mg by mouth every evening.   amLODipine 2.5 MG tablet Commonly known as:  NORVASC Take 1 tablet (2.5 mg total) by mouth daily. What changed:  when to take this   beta carotene w/minerals tablet Take 1 tablet by mouth every morning.     cefdinir 300 MG capsule Commonly known as:  OMNICEF Take 1 capsule (300 mg total) by mouth every 12 (twelve) hours.   clopidogrel 75 MG tablet Commonly known as:  PLAVIX Take 1 tablet (75 mg total) by mouth daily with breakfast.   docusate sodium 100 MG capsule Commonly known as:  COLACE Take 100 mg by mouth every morning.   ferrous sulfate 325 (65 FE) MG tablet Take 325 mg by mouth every evening.   finasteride 5 MG tablet Commonly known as:  PROSCAR Take 5 mg by mouth every morning.   levothyroxine 75 MCG tablet Commonly known as:  SYNTHROID, LEVOTHROID Take 1 tablet (75 mcg total) by mouth daily at 6 (six) AM. Start taking on:  July 28, 2018 What changed:    medication strength  how much to take  when to take this   lovastatin 20 MG tablet Commonly known as:  MEVACOR Take 1 tablet (20 mg total) by mouth daily at 6 PM. What changed:  when to take this   meclizine 25 MG tablet Commonly known as:  ANTIVERT Take 1 tablet (25 mg total) by mouth 3 (three) times  daily as needed for dizziness.   multivitamin with minerals Tabs tablet Take 1 tablet by mouth every morning.   nitroGLYCERIN 0.4 MG SL tablet Commonly known as:  NITROSTAT Place 1 tablet (0.4 mg total) under the tongue every 5 (five) minutes as needed for chest pain.   PROBIOTIC DAILY PO Take 1 capsule by mouth every morning.   THERATEARS OP Apply 1 drop to eye daily as needed (dry eyes).       Major procedures and Radiology Reports - PLEASE review detailed and final reports for all details, in brief -      Ct Head Wo Contrast  Result Date: 07/25/2018 CLINICAL DATA:  Altered LOC, lower extremity weakness EXAM: CT HEAD WITHOUT CONTRAST TECHNIQUE: Contiguous axial images were obtained from the base of the skull through the vertex without intravenous contrast. COMPARISON:  CT 08/05/2016 FINDINGS: Brain: No acute territorial infarction, hemorrhage or intracranial mass. Moderate-to-marked atrophy.  Moderate small vessel ischemic changes of the white matter. Stable ventricular enlargement Vascular: No hyperdense vessel. Vertebral and carotid vascular calcification Skull: Normal. Negative for fracture or focal lesion. Sinuses/Orbits: No acute finding. Other: None IMPRESSION: 1. No CT evidence for acute intracranial abnormality. 2. Atrophy with small vessel ischemic changes of the white matter. Electronically Signed   By: Donavan Foil M.D.   On: 07/25/2018 22:24    Micro Results     Recent Results (from the past 240 hour(s))  Urine Culture     Status: Abnormal   Collection Time: 07/24/18  3:50 PM  Result Value Ref Range Status   MICRO NUMBER: [id number]  Final   SPECIMEN QUALITY: Adequate  Final   Sample Source NOT GIVEN  Final   STATUS: FINAL  Final   ISOLATE 1: Klebsiella pneumoniae (A)  Final    Comment: Greater than 100,000 CFU/mL of Klebsiella pneumoniae      Susceptibility   Klebsiella pneumoniae - URINE CULTURE, REFLEX    AMOX/CLAVULANIC 4 Sensitive     AMPICILLIN  Resistant     AMPICILLIN/SULBACTAM 4 Sensitive     CEFAZOLIN* <=4 Not Reportable      * For infections other than uncomplicated UTIcaused by E. coli, K. pneumoniae or P. mirabilis:Cefazolin is resistant if MIC > or = 8 mcg/mL.(Distinguishing susceptible versus intermediatefor isolates with MIC < or = 4 mcg/mL requiresadditional testing.)For uncomplicated UTI caused by E. coli,K. pneumoniae or P. mirabilis: Cefazolin issusceptible if MIC <32 mcg/mL and predictssusceptible to the oral agents cefaclor, cefdinir,cefpodoxime, cefprozil, cefuroxime, cephalexinand loracarbef.    CEFEPIME <=1 Sensitive     CEFTRIAXONE <=1 Sensitive     CIPROFLOXACIN <=0.5 Sensitive     LEVOFLOXACIN <=0.12 Sensitive     ERTAPENEM <=0.5 Sensitive     GENTAMICIN <=1 Sensitive     IMIPENEM <=0.25 Sensitive     NITROFURANTOIN 32 Sensitive     PIP/TAZO <=4 Sensitive     TOBRAMYCIN <=1 Sensitive     TRIMETH/SULFA* 32 Sensitive      * For  infections other than uncomplicated UTIcaused by E. coli, K. pneumoniae or P. mirabilis:Cefazolin is resistant if MIC > or = 8 mcg/mL.(Distinguishing susceptible versus intermediatefor isolates with MIC < or = 4 mcg/mL requiresadditional testing.)For uncomplicated UTI caused by E. coli,K. pneumoniae or P. mirabilis: Cefazolin issusceptible if MIC <32 mcg/mL and predictssusceptible to the oral agents cefaclor, cefdinir,cefpodoxime, cefprozil, cefuroxime, cephalexinand loracarbef.Legend:S = Susceptible  I = IntermediateR = Resistant  NS = Not susceptible* = Not tested  NR = Not reported**NN = See antimicrobic comments  Urine culture     Status: Abnormal   Collection Time: 07/25/18 11:32 PM  Result Value Ref Range Status   Specimen Description   Final    URINE, RANDOM Performed at [facility], [address] [place], [place] [address]    Special Requests   Final    NONE Performed at [facility], [address] [place], [place] [address]    Culture (A)  Final    <10,000 COLONIES/mL INSIGNIFICANT GROWTH Performed at [facility], [place] [address] [place], [place] [address]    Report Status 07/27/2018 FINAL  Final       Today   Subjective    Justin [name] today states that he feels better.  Wife notes that he has a bed available at [facility]. Objective   Blood pressure 130/72, pulse 67, temperature (!) 97.5 F (36.4 C), temperature source Oral, resp. rate 20, height 5\' 5"  (1.651 m), weight 67.5 kg, SpO2 97 %.   Intake/Output Summary (Last 24 hours) at 07/27/2018 1145 Last data filed at 07/27/2018 0804 Gross per 24 hour  Intake 1300.45 ml  Output 2250 ml  Net -949.55 ml    Exam  Constitutional: NAD, calm, comfortable Eyes: PERRL, lids and conjunctivae normal ENMT: Mucous membranes are moist. Posterior pharynx clear of any exudate or lesions.Normal dentition.  Neck: normal, supple, no masses, no thyromegaly Respiratory: clear to auscultation  bilaterally, no wheezing, no crackles. Normal respiratory effort. No accessory muscle use.  Cardiovascular: Regular rate and rhythm, no murmurs / rubs / gallops. No extremity edema. 2+ pedal pulses. No carotid bruits.  Abdomen: no tenderness, no masses palpated. No hepatosplenomegaly. Bowel sounds positive.  Musculoskeletal: no clubbing / cyanosis. No joint deformity upper and lower extremities. Good ROM, no contractures. Normal muscle tone.  Skin: no rashes, lesions, ulcers. No induration Neurologic: CN 2-12 grossly intact. Sensation intact, DTR normal. Strength 5/5 in all 4.  Psychiatric: Normal judgment and insight. Alert and oriented x 3. Normal mood.    Data Review   CBC w Diff:  Lab Results  Component Value Date   WBC 9.7 07/26/2018   HGB 12.5 (L) 07/26/2018   HGB 13.5 01/18/2018   HCT 37.8 (L) 07/26/2018   HCT 41.3 01/18/2018   PLT 351 07/26/2018   PLT 393 01/18/2018   LYMPHOPCT 7 11/24/2016   MONOPCT 5 11/24/2016   EOSPCT 1 11/24/2016   BASOPCT 0 11/24/2016    CMP:  Lab Results  Component Value Date   NA 133 (L) 07/27/2018   NA 139 01/18/2018   K 4.1 07/27/2018   CL 101 07/27/2018   CO2 26 07/27/2018   BUN 10 07/27/2018   BUN 25 01/18/2018   CREATININE 1.26 (H) 07/27/2018   CREATININE 1.36 (H) 06/27/2017   PROT 6.4 03/21/2018   ALBUMIN 4.1 03/21/2018   BILITOT 0.4 03/21/2018   ALKPHOS 65 03/21/2018   AST 19 03/21/2018   ALT 12 03/21/2018  .   Total Time in preparing paper work, data evaluation and todays exam - 35 minutes  Norval Morton M.D on 07/27/2018 at 11:45 AM  [facility]   Office  [phone]

## 2018-07-27 NOTE — Clinical Social Work Note (Signed)
[employer] has granted authorization. Pt has been approved to admit to [name] when medically cleared.   [place], [place]

## 2018-07-27 NOTE — Clinical Social Work Note (Signed)
Clinical Social Worker facilitated patient discharge including contacting patient family and facility to confirm patient discharge plans.  Clinical information faxed to facility and family agreeable with plan.  Pt's spouse will transport pt to [name] .  RN to call : [phone] for report prior to discharge.  Clinical Social Worker will sign off for now as social work intervention is no longer needed. Please consult [place] again if new need arises.  [place], [place]

## 2018-07-27 NOTE — Clinical Social Work Placement (Signed)
   CLINICAL SOCIAL WORK PLACEMENT  NOTE  Date:  07/27/2018  Patient Details  Name: Justin [name] MRN: [id number] Date of Birth: 1928/08/13  Clinical Social Work is seeking post-discharge placement for this patient at the [place] level of care (*CSW will initial, date and re-position this form in  chart as items are completed):      Patient/family provided with [place] Work Department's list of facilities offering this level of care within the geographic area requested by the patient (or if unable, by the patient's family).  Yes   Patient/family informed of their freedom to choose among providers that offer the needed level of care, that participate in Medicare, Medicaid or managed care program needed by the patient, have an available bed and are willing to accept the patient.      Patient/family informed of [facility]'s ownership interest in [facility] and [facility], as well as of the fact that they are under no obligation to receive care at these facilities.  PASRR submitted to EDS on       PASRR number received on 07/26/18     Existing PASRR number confirmed on       FL2 transmitted to all facilities in geographic area requested by pt/family on 07/26/18     FL2 transmitted to all facilities within larger geographic area on       Patient informed that his/her managed care company has contracts with or will negotiate with certain facilities, including the following:        Yes   Patient/family informed of bed offers received.  Patient chooses bed at [facility]     Physician recommends and patient chooses bed at      Patient to be transferred to [facility] on 07/27/18.  Patient to be transferred to facility by Spouse will transport     Patient family notified on 07/27/18 of transfer.  Name of family member notified:  PT's spouse notified at bedside.     PHYSICIAN Please prepare priority  discharge summary, including medications, Please prepare prescriptions     Additional Comment:    _______________________________________________ Eileen Stanford, LCSW 07/27/2018, 11:44 AM

## 2018-08-01 DIAGNOSIS — E039 Hypothyroidism, unspecified: Secondary | ICD-10-CM | POA: Diagnosis not present

## 2018-08-01 DIAGNOSIS — N3000 Acute cystitis without hematuria: Secondary | ICD-10-CM | POA: Diagnosis not present

## 2018-08-01 DIAGNOSIS — E871 Hypo-osmolality and hyponatremia: Secondary | ICD-10-CM | POA: Diagnosis not present

## 2018-08-01 DIAGNOSIS — R531 Weakness: Secondary | ICD-10-CM | POA: Diagnosis not present

## 2018-08-03 DIAGNOSIS — R531 Weakness: Secondary | ICD-10-CM | POA: Diagnosis not present

## 2018-08-03 DIAGNOSIS — I2510 Atherosclerotic heart disease of native coronary artery without angina pectoris: Secondary | ICD-10-CM | POA: Diagnosis not present

## 2018-08-03 DIAGNOSIS — N3000 Acute cystitis without hematuria: Secondary | ICD-10-CM | POA: Diagnosis not present

## 2018-08-03 DIAGNOSIS — E871 Hypo-osmolality and hyponatremia: Secondary | ICD-10-CM | POA: Diagnosis not present

## 2018-08-04 ENCOUNTER — Telehealth: Payer: Self-pay | Admitting: *Deleted

## 2018-08-04 NOTE — Telephone Encounter (Signed)
Copied from [place] [id number]. Topic: Appointment Scheduling - Scheduling Inquiry for Clinic >> Aug 04, 2018  8:46 AM Lennox Solders wrote: reason for CRM:pt wife is calling and her husband was discharge on 07-27-2018  [facility] and pt was discharged from  [facility]  07-03-2018 and needs post hosp follow up . Dr Charlett Blake does not have soon hosp follow up appt. Please advise

## 2018-08-04 NOTE — Telephone Encounter (Signed)
Please find him an appt in next couple of weeks. Could come in early on a Thursday maybe

## 2018-08-08 DIAGNOSIS — L089 Local infection of the skin and subcutaneous tissue, unspecified: Secondary | ICD-10-CM | POA: Diagnosis not present

## 2018-08-08 NOTE — Telephone Encounter (Signed)
Patent scheduled for 08/22/18

## 2018-08-08 NOTE — Telephone Encounter (Signed)
thanks

## 2018-08-13 ENCOUNTER — Emergency Department (HOSPITAL_BASED_OUTPATIENT_CLINIC_OR_DEPARTMENT_OTHER): Payer: PPO

## 2018-08-13 ENCOUNTER — Emergency Department (HOSPITAL_BASED_OUTPATIENT_CLINIC_OR_DEPARTMENT_OTHER)
Admission: EM | Admit: 2018-08-13 | Discharge: 2018-08-13 | Disposition: A | Discharge status: Home / Self Care | Payer: PPO | Attending: Emergency Medicine | Admitting: Emergency Medicine

## 2018-08-13 ENCOUNTER — Encounter (HOSPITAL_BASED_OUTPATIENT_CLINIC_OR_DEPARTMENT_OTHER): Payer: Self-pay | Admitting: Emergency Medicine

## 2018-08-13 ENCOUNTER — Other Ambulatory Visit: Payer: Self-pay

## 2018-08-13 ENCOUNTER — Emergency Department (HOSPITAL_COMMUNITY): Payer: PPO

## 2018-08-13 DIAGNOSIS — I509 Heart failure, unspecified: Secondary | ICD-10-CM | POA: Insufficient documentation

## 2018-08-13 DIAGNOSIS — Z79899 Other long term (current) drug therapy: Secondary | ICD-10-CM | POA: Diagnosis not present

## 2018-08-13 DIAGNOSIS — Z951 Presence of aortocoronary bypass graft: Secondary | ICD-10-CM | POA: Diagnosis not present

## 2018-08-13 DIAGNOSIS — Z96642 Presence of left artificial hip joint: Secondary | ICD-10-CM | POA: Insufficient documentation

## 2018-08-13 DIAGNOSIS — I10 Essential (primary) hypertension: Secondary | ICD-10-CM | POA: Diagnosis not present

## 2018-08-13 DIAGNOSIS — R270 Ataxia, unspecified: Secondary | ICD-10-CM | POA: Diagnosis not present

## 2018-08-13 DIAGNOSIS — I2510 Atherosclerotic heart disease of native coronary artery without angina pectoris: Secondary | ICD-10-CM | POA: Diagnosis not present

## 2018-08-13 DIAGNOSIS — R42 Dizziness and giddiness: Secondary | ICD-10-CM | POA: Diagnosis not present

## 2018-08-13 DIAGNOSIS — R0689 Other abnormalities of breathing: Secondary | ICD-10-CM | POA: Diagnosis not present

## 2018-08-13 DIAGNOSIS — Z87891 Personal history of nicotine dependence: Secondary | ICD-10-CM | POA: Insufficient documentation

## 2018-08-13 DIAGNOSIS — Z7901 Long term (current) use of anticoagulants: Secondary | ICD-10-CM | POA: Insufficient documentation

## 2018-08-13 DIAGNOSIS — R001 Bradycardia, unspecified: Secondary | ICD-10-CM | POA: Diagnosis not present

## 2018-08-13 DIAGNOSIS — Z85828 Personal history of other malignant neoplasm of skin: Secondary | ICD-10-CM | POA: Diagnosis not present

## 2018-08-13 DIAGNOSIS — I252 Old myocardial infarction: Secondary | ICD-10-CM | POA: Diagnosis not present

## 2018-08-13 DIAGNOSIS — I110 Hypertensive heart disease with heart failure: Secondary | ICD-10-CM | POA: Diagnosis not present

## 2018-08-13 DIAGNOSIS — R531 Weakness: Secondary | ICD-10-CM | POA: Diagnosis not present

## 2018-08-13 LAB — CBC WITH DIFFERENTIAL/PLATELET
Abs Immature Granulocytes: 0.02 10*3/uL (ref 0.00–0.07)
Basophils Absolute: 0.1 10*3/uL (ref 0.0–0.1)
Basophils Relative: 1 %
EOS PCT: 2 %
Eosinophils Absolute: 0.1 10*3/uL (ref 0.0–0.5)
HCT: 39.8 % (ref 39.0–52.0)
Hemoglobin: 12.8 g/dL — ABNORMAL LOW (ref 13.0–17.0)
Immature Granulocytes: 0 %
Lymphocytes Relative: 20 %
Lymphs Abs: 1.2 10*3/uL (ref 0.7–4.0)
MCH: 29 pg (ref 26.0–34.0)
MCHC: 32.2 g/dL (ref 30.0–36.0)
MCV: 90.2 fL (ref 80.0–100.0)
MONO ABS: 0.6 10*3/uL (ref 0.1–1.0)
Monocytes Relative: 10 %
Neutro Abs: 4 10*3/uL (ref 1.7–7.7)
Neutrophils Relative %: 67 %
Platelets: 347 10*3/uL (ref 150–400)
RBC: 4.41 MIL/uL (ref 4.22–5.81)
RDW: 15.3 % (ref 11.5–15.5)
WBC: 5.9 10*3/uL (ref 4.0–10.5)
nRBC: 0 % (ref 0.0–0.2)

## 2018-08-13 LAB — COMPREHENSIVE METABOLIC PANEL
ALT: 23 U/L (ref 0–44)
AST: 21 U/L (ref 15–41)
Albumin: 3.3 g/dL — ABNORMAL LOW (ref 3.5–5.0)
Alkaline Phosphatase: 60 U/L (ref 38–126)
Anion gap: 7 (ref 5–15)
BUN: 15 mg/dL (ref 8–23)
CHLORIDE: 100 mmol/L (ref 98–111)
CO2: 24 mmol/L (ref 22–32)
Calcium: 8.5 mg/dL — ABNORMAL LOW (ref 8.9–10.3)
Creatinine, Ser: 0.83 mg/dL (ref 0.61–1.24)
GFR calc Af Amer: >60 mL/min (ref >60)
Glucose, Bld: 104 mg/dL — ABNORMAL HIGH (ref 70–99)
Potassium: 3.7 mmol/L (ref 3.5–5.1)
Sodium: 131 mmol/L — ABNORMAL LOW (ref 135–145)
Total Bilirubin: 0.9 mg/dL (ref 0.3–1.2)
Total Protein: 6.3 g/dL — ABNORMAL LOW (ref 6.5–8.1)

## 2018-08-13 LAB — CBG MONITORING, ED: Glucose-Capillary: 95 mg/dL (ref 70–99)

## 2018-08-13 LAB — TROPONIN I: Troponin I: <0.03 ng/mL (ref <0.03)

## 2018-08-13 MED ORDER — MECLIZINE HCL 25 MG PO TABS
25.0000 mg | ORAL_TABLET | Freq: Once | ORAL | Status: AC
  Administered 2018-08-13: 25 mg via ORAL
  Filled 2018-08-13: qty 1

## 2018-08-13 NOTE — ED Notes (Signed)
Pt transported to MRI 

## 2018-08-13 NOTE — ED Notes (Signed)
Patient transported to CT 

## 2018-08-13 NOTE — ED Provider Notes (Signed)
[name] Provider Note   CSN: [id number] Arrival date & time: 08/13/18  1128     History   Chief Complaint Chief Complaint  Patient presents with  . Dizziness    HPI Justin [name] is a 85 y.o. male.  Patient is an 85 year old male with a history of coronary artery disease, CHF, hypertension, hyperlipidemia, aortic stenosis, fire vertigo who is presenting today with dizziness.  Patient states that he felt fine when he went to bed last night and when he woke up this morning and try to get out of bed he was very dizzy which he describes as an off-balance feeling that he was unable to even sit upright in bed and fell over in the bed.  When he attempted to walk he could not because he was stumbling and could not get his balance.  His wife did give him a meclizine and he states he feels a little better but he is still having the dizzy sensation and still feeling off balance.  Patient does have a significant history of recent admission for hyponatremia and UTI but states the generalized weakness he was feeling from that has completely resolved.  He has been eating and drinking normally and denies any new medications.  He has not had fever, vomiting or diarrhea.  He states he has had this feeling before as early as a few weeks ago but has never been this severe or cause difficulty walking.  Denies any vision changes, issues with his speech or unilateral weakness or numbness.  The history is provided by the patient and the spouse.  Dizziness  Quality:  Imbalance and vertigo Severity:  Severe Onset quality:  Sudden Duration: started when woke up this morning but normal last night before bed. Timing:  Constant Progression:  Partially resolved Chronicity:  Recurrent (pt states he had this a few weeks ago but not to this extent) Context comment:  Started when he tried to get out of bed this morning Relieved by:  Being still Worsened by:  Movement Ineffective  treatments: Meclizine. Associated symptoms: no chest pain, no diarrhea, no headaches, no nausea, no shortness of breath, no syncope, no tinnitus, no vision changes, no vomiting and no weakness   Risk factors: heart disease and hx of vertigo     Past Medical History:  Diagnosis Date  . Basal cell carcinoma   . BPH (benign prostatic hypertrophy)   . CAD (coronary artery disease) 1991   a. H/o MI w/ PTCA;  b. s/p CABG by Dr Roxan Hockey w/ LIMA-LAD, SVG-OM1-OM3, SVG-D1, SVG-PDA  . CHF (congestive heart failure) ([place])   . Complication of anesthesia    Wife reports history of confusion after last surgery in 10/2016-lasted a few days  . HTN (hypertension)   . Hyperlipidemia   . Internal hemorrhoids   . Moderate aortic stenosis    a. 08/2014 Echo: EF 55-60%, no rwma, Gr 1 DD, mod AS, mildly dil asc Ao, mild MR, sev dil LA.  . NSTEMI (non-ST elevated myocardial infarction) ([place]) 05/2017    PCI with DES to SVG-OM1. SVG-OM3 occluded, LIMA-LAD and SVG-RCA patent  . PAF (paroxysmal atrial fibrillation) ([place])    a. Dx 08/2014;  b. CHA2DS2VASc = 4 - decision made to withold Pace 2/2 unsteady gait and syncope.  . Sigmoid diverticulitis   . Syncope    a. 08/2014 - ? orthostatic    Patient Active Problem List   Diagnosis Date Noted  . Sundowning 06/24/2017  .  Ischemic cardiomyopathy 06/24/2017  . Anticoagulated 06/24/2017  . Syncope   . Sigmoid diverticulitis   . Internal hemorrhoids   . HTN (hypertension)   . Complication of anesthesia   . CHF (congestive heart failure) ([place])   . Basal cell carcinoma   . NSTEMI (non-ST elevated myocardial infarction) ([place])   . Chest pain 06/10/2017  . Unstable angina ([place]) 06/10/2017  . UTI (urinary tract infection) 11/24/2016  . Sepsis ([place]) 11/24/2016  . Generalized weakness 11/24/2016  . Hyponatremia 11/24/2016  . BPH with urinary obstruction 11/04/2016  . H/O prostatectomy 11/04/2016  . Fall   . Closed left hip fracture ([place]) 10/13/2015  . Medicare  annual wellness visit, subsequent 03/16/2015  . Paroxysmal atrial fibrillation ([place]) 09/26/2014  . Orthostatic hypotension 09/15/2014  . Head trauma 09/14/2014  . Alteration in anticoagulation 09/14/2014  . Tinnitus 03/21/2014  . Epigastric pain 02/01/2013  . Gait instability 01/01/2013  . Disequilibrium 04/03/2012  . Hyperglycemia 09/13/2011  . Aortic stenosis 03/10/2011  . CAD S/P percutaneous coronary angioplasty 02/23/2010  . Hyperlipidemia 07/04/2008  . Essential hypertension 07/04/2008  . RHINITIS 07/04/2008  . BPH (benign prostatic hyperplasia) 07/04/2008  . Hx of CABG 06/28/1989    Past Surgical History:  Procedure Laterality Date  . CARDIAC CATHETERIZATION    . COLONOSCOPY    . CORONARY ARTERY BYPASS GRAFT  2000   Dr Roxan Hockey  LIMA-LAD, SVG-OM1-OM3, SVG-D1, SVG-PDA.   [name] CORONARY STENT INTERVENTION N/A 06/13/2017   Procedure: CORONARY STENT INTERVENTION;  Surgeon: Lorretta Harp, MD;  Location: [facility];  Service: Cardiovascular;  Laterality: N/A;  . CYSTOSCOPY N/A 12/07/2016   Procedure: CYSTOSCOPY;  Surgeon: Cleon Gustin, MD;  Location: [facility];  Service: Urology;  Laterality: N/A;  NEEDS 30 MIN TOTAL FOR SURGERY  . CYSTOSCOPY WITH URETHRAL DILATATION N/A 02/21/2017   Procedure: DILATATION OF BLADDER NECK WITH INJECTION OF MITOMYCIN C;  Surgeon: Cleon Gustin, MD;  Location: [facility];  Service: Urology;  Laterality: N/A;  . EYE SURGERY     bil cataract  . HIP ARTHROPLASTY Left 10/14/2015   Procedure: LEFT HIP HEMI ARTHROPLASTY;  Surgeon: [name], MD;  Location: [facility];  Service: Orthopedics;  Laterality: Left;  . LEFT HEART CATH AND CORS/GRAFTS ANGIOGRAPHY N/A 06/13/2017   Procedure: LEFT HEART CATH AND CORS/GRAFTS ANGIOGRAPHY;  Surgeon: Lorretta Harp, MD;  Location: [facility];  Service: Cardiovascular;  Laterality: N/A;  . SHOULDER SURGERY     x2  . TONSILLECTOMY    . TRANSTHORACIC ECHOCARDIOGRAM  10-06-2016   dr Stanford Breed    mild LVH, ef 40-45%, diffuse hypokinesis/ moderate AV calcification leaflets and annulus,  severe thickened leaflets, mod.-sev. aortic stenosis (Valva area 0.61cm^2, mean grandient 97mmHg, peak gradiant 51mmHg)/  moderate thickened , mild calcified MV leaflets without stenosis, moderate MR/  severe LAE/ trivial PR and TR  . TRANSURETHRAL RESECTION OF BLADDER NECK N/A 12/07/2016   Procedure: TRANSURETHRAL RESECTION OF BLADDER NECK;  Surgeon: Cleon Gustin, MD;  Location: [facility];  Service: Urology;  Laterality: N/A;  . XI ROBOTIC ASSISTED SIMPLE PROSTATECTOMY N/A 11/04/2016   Procedure: XI ROBOTIC ASSISTED SIMPLE PROSTATECTOMY;  Surgeon: Cleon Gustin, MD;  Location: [facility];  Service: Urology;  Laterality: N/A;        Home Medications    Prior to Admission medications   Medication Sig Start Date End Date Taking? Authorizing Provider  cefadroxil (DURICEF) 500 MG capsule Take 500 mg by mouth 2 (two) times daily.  Yes [provider]  clindamycin-tretinoin Pershing Proud) gel Apply topically at bedtime.   Yes [provider]  acetaminophen (TYLENOL) 500 MG tablet Take 500 mg by mouth daily as needed for moderate pain.    [provider]  amiodarone (PACERONE) 200 MG tablet Take 200 mg by mouth every evening.     [provider]  amLODipine (NORVASC) 2.5 MG tablet Take 1 tablet (2.5 mg total) by mouth daily. Patient taking differently: Take 2.5 mg by mouth every evening.  03/27/18   Lelon Perla, MD  beta carotene w/minerals (OCUVITE) tablet Take 1 tablet by mouth every morning.     [provider]  Carboxymethylcellulose Sodium (THERATEARS OP) Apply 1 drop to eye daily as needed (dry eyes).    [provider]  cefdinir (OMNICEF) 300 MG capsule Take 1 capsule (300 mg total) by mouth every 12 (twelve) hours. Patient not taking: Reported on 08/13/2018 07/27/18   Norval Morton, MD  clopidogrel (PLAVIX) 75 MG tablet Take 1 tablet (75 mg  total) by mouth daily with breakfast. 07/10/18   Lelon Perla, MD  docusate sodium (COLACE) 100 MG capsule Take 100 mg by mouth every morning.     [provider]  ferrous sulfate 325 (65 FE) MG tablet Take 325 mg by mouth every evening.     [provider]  finasteride (PROSCAR) 5 MG tablet Take 5 mg by mouth every morning.     [provider]  levothyroxine (SYNTHROID, LEVOTHROID) 75 MCG tablet Take 1 tablet (75 mcg total) by mouth daily at 6 (six) AM. 07/28/18   Norval Morton, MD  lovastatin (MEVACOR) 20 MG tablet Take 1 tablet (20 mg total) by mouth daily at 6 PM. Patient taking differently: Take 20 mg by mouth every morning.  03/03/18   Lelon Perla, MD  meclizine (ANTIVERT) 25 MG tablet Take 1 tablet (25 mg total) by mouth 3 (three) times daily as needed for dizziness. 08/05/16   Fredia Sorrow, MD  Multiple Vitamin (MULTIVITAMIN WITH MINERALS) TABS tablet Take 1 tablet by mouth every morning.     [provider]  nitroGLYCERIN (NITROSTAT) 0.4 MG SL tablet Place 1 tablet (0.4 mg total) under the tongue every 5 (five) minutes as needed for chest pain. 06/14/18   Lelon Perla, MD  Probiotic Product (PROBIOTIC DAILY PO) Take 1 capsule by mouth every morning.     [provider]    Family History Family History  Problem Relation Age of Onset  . Coronary artery disease Father   . Hyperlipidemia Father   . Heart attack Father   . Alcohol abuse Father   . Bipolar disorder Mother   . Heart disease Mother   . Alcohol abuse Brother   . Cancer Brother   . Cancer Maternal Grandfather        oral cancer, chew tobacco  . Cancer Sister   . Glaucoma Sister   . Alcohol abuse Other     Social History Social History   Tobacco Use  . Smoking status: Former Smoker    Packs/day: 0.50    Years: 15.00    Pack years: 7.50    Last attempt to quit: 06/29/1971    Years since quitting: 47.1  . Smokeless tobacco: Never Used  Substance Use  Topics  . Alcohol use: Yes    Alcohol/week: 1.0 - 2.0 standard drinks    Types: 1 - 2 Glasses of wine per week    Comment:  alternates wine and liquor. 1/2 pint liquor/week  . Drug use: No     Allergies   Morphine and related; Diltiazem hcl; Metoprolol; Toradol [ketorolac tromethamine]; Hydrocodone; and Dutasteride   Review of Systems Review of Systems  HENT: Negative for tinnitus.   Respiratory: Negative for shortness of breath.   Cardiovascular: Negative for chest pain and syncope.  Gastrointestinal: Negative for diarrhea, nausea and vomiting.  Neurological: Positive for dizziness. Negative for weakness and headaches.  All other systems reviewed and are negative.    Physical Exam Updated Vital Signs BP (!) 166/81   Pulse 63   Temp 97.9 F (36.6 C) (Oral)   Resp 17   Wt 65.8 kg   SpO2 98%   BMI 24.13 kg/m   Physical Exam Vitals signs and nursing note reviewed.  Constitutional:      General: He is not in acute distress.    Appearance: He is well-developed.  HENT:     Head: Normocephalic and atraumatic.  Eyes:     General: No visual field deficit.    Conjunctiva/sclera: Conjunctivae normal.     Pupils: Pupils are equal, round, and reactive to light.  Neck:     Musculoskeletal: Normal range of motion and neck supple.  Cardiovascular:     Rate and Rhythm: Normal rate and regular rhythm.     Pulses: Normal pulses.     Heart sounds: Murmur present. Systolic murmur present with a grade of 3/6.  Pulmonary:     Effort: Pulmonary effort is normal. No respiratory distress.     Breath sounds: Normal breath sounds. No wheezing or rales.  Abdominal:     General: There is no distension.     Palpations: Abdomen is soft.     Tenderness: There is no abdominal tenderness. There is no guarding or rebound.  Musculoskeletal: Normal range of motion.        General: No tenderness.  Skin:    General: Skin is warm and dry.     Findings: No erythema or rash.  Neurological:      Mental Status: He is alert and oriented to person, place, and time.     Cranial Nerves: Cranial nerves are intact. No cranial nerve deficit, dysarthria or facial asymmetry.     Sensory: Sensation is intact.     Motor: Motor function is intact. No weakness or pronator drift.     Coordination: Finger-Nose-Finger Test abnormal and Heel to [place] Test abnormal.     Comments: Minimal nystagmus noted.  Patient had difficulty with both finger-to-nose and heel-to-shin testing.  Unable to ambulate due to the dizziness at this time.  Psychiatric:        Behavior: Behavior normal.      ED Treatments / Results  Labs (all labs ordered are listed, but only abnormal results are displayed) Labs Reviewed  CBC WITH DIFFERENTIAL/PLATELET - Abnormal; Notable for the following components:      Result Value   Hemoglobin 12.8 (*)    All other components within normal limits  COMPREHENSIVE METABOLIC PANEL - Abnormal; Notable for the following components:   Sodium 131 (*)    Glucose, Bld 104 (*)    Calcium 8.5 (*)    Total Protein 6.3 (*)    Albumin 3.3 (*)    All other components within normal limits  TROPONIN I  CBG MONITORING, ED    EKG EKG Interpretation  Date/Time:  Sunday August 13 2018 11:32:25 EST Ventricular Rate:  63 PR Interval:  QRS Duration: 132 QT Interval:  475 QTC Calculation: 487 R Axis:   -64 Text Interpretation:  Sinus rhythm Prolonged PR interval Nonspecific IVCD with LAD LVH with secondary repolarization abnormality Anterior Q waves, possibly due to LVH No significant change since last tracing Confirmed by Blanchie Dessert [phone]) on 08/13/2018 12:33:22 PM Also confirmed by Blanchie Dessert [phone]), editor Radene Gunning [phone])  on 08/13/2018 12:34:56 PM   Radiology Dg Chest 2 View  Result Date: 08/13/2018 CLINICAL DATA:  Dizziness and weakness. EXAM: CHEST - 2 VIEW COMPARISON:  February 16, 2018 FINDINGS: Calcified nodes in the mediastinum unchanged. Stable cardiomegaly.  Hila and mediastinum are stable. No pulmonary nodules or masses. No focal infiltrates. IMPRESSION: No active cardiopulmonary disease. Electronically Signed   By: Dorise Bullion III M.D   On: 08/13/2018 13:14   Ct Head Wo Contrast  Result Date: 08/13/2018 CLINICAL DATA:  Dizziness.  History of vertigo. EXAM: CT HEAD WITHOUT CONTRAST TECHNIQUE: Contiguous axial images were obtained from the base of the skull through the vertex without intravenous contrast. COMPARISON:  CT of 07/25/2018 and 08/05/2016. FINDINGS: Brain: There is no evidence of acute intracranial hemorrhage, mass lesion, brain edema or extra-axial fluid collection. There is stable moderate atrophy with diffuse prominence of the ventricles and subarachnoid spaces. There are stable chronic small vessel ischemic changes in the periventricular and subcortical white matter. There is no CT evidence of acute cortical infarction. Vascular: Prominent intracranial vascular calcifications. No hyperdense vessel identified. Skull: Negative for fracture or focal lesion. Sinuses/Orbits: The visualized paranasal sinuses and mastoid air cells are clear. No orbital abnormalities are seen. Other: None. IMPRESSION: Stable examination without evidence of acute process. Atrophy and chronic small vessel ischemic changes. Electronically Signed   By: Richardean Sale M.D.   On: 08/13/2018 13:06    Procedures Procedures (including critical care time)  Medications Ordered in ED Medications  meclizine (ANTIVERT) tablet 25 mg (has no administration in time range)     Initial Impression / Assessment and Plan / ED Course  I have reviewed the triage vital signs and the nursing notes.  Pertinent labs & imaging results that were available during my care of the patient were reviewed by me and considered in my medical decision making (see chart for details).     Elderly male with multiple medical problems presenting today with dizziness which question is whether it  is peripheral or central.  He was last seen normal by his wife last night before bed.  She did give the patient meclizine because he has a history of dizziness but it did not help his symptoms.  He states he felt off balance even sitting in the bed and several times fell over in the bed and when he attempted to walk he describes symptoms of ataxia.  Patient has no other systemic symptoms.  He has no other strokelike findings.  Patient symptoms may all be related to peripheral vertigo as his labs are reassuring and head CT is negative however could also be central vertigo from stroke.  He denies any headache or neck pain at this time.  EKG without acute findings and troponin is negative.  Patient CBGs within normal limits.  Patient will be transferred to come for MRI to rule out stroke.  He may require admission if the dizziness continues and he is unable to ambulate.  He was given a dose of meclizine here as the last dose he had was at 7 AM this morning.  Final Clinical Impressions(s) / ED  Diagnoses   Final diagnoses:  Vertigo    ED Discharge Orders    None       Blanchie Dessert, MD 08/13/18 [phone]

## 2018-08-13 NOTE — ED Notes (Signed)
Patient verbalizes understanding of discharge instructions. Opportunity for questioning and answers were provided. Armband removed by staff, pt discharged from ED in wheelchair.  

## 2018-08-13 NOTE — ED Notes (Signed)
ED Provider at bedside. 

## 2018-08-13 NOTE — ED Notes (Signed)
Pt arrived via [facility]. Pt is a transfer from [facility]. Pt has been having dizziness for about 3 years with the most recent episode happening this morning. Pt is being transferred here for an MRI. Pt is alert and oriented x4 with VSS at this time.

## 2018-08-13 NOTE — ED Triage Notes (Signed)
Pt arrives via EMS with c/o dizziness. He states when he attempted to get up this morning he felt dizzy. He has a hx of vertigo. He states he feels ok lying down. Denies chest pain, SOB.

## 2018-08-13 NOTE — ED Provider Notes (Signed)
MRI without acute findings. IMPRESSION:  1. No acute intracranial abnormality.  2. Moderate chronic small vessel ischemic disease and cerebral  atrophy.      Consistent with peripheral vertigo.  Stable for dc   Dorie Rank, MD 08/13/18 1925

## 2018-08-13 NOTE — Discharge Instructions (Addendum)
Follow up with your neurologist, take the medications as prescribed for the dizziness, consider trying the epley maneuvers

## 2018-08-13 NOTE — ED Notes (Signed)
Family at bedside. 

## 2018-08-15 ENCOUNTER — Telehealth: Payer: Self-pay

## 2018-08-15 ENCOUNTER — Ambulatory Visit: Payer: PPO | Admitting: Family Medicine

## 2018-08-15 NOTE — Telephone Encounter (Signed)
Copied from [place] [phone]. Topic: General - Other >> Aug 07, 2018  3:42 PM Leward Quan A wrote: Reason for CRM: Patient wife Ms [place] called to schedule a follow up visit for her husband coming out of the hospital and in need of an a visit soon. First available with Dr Charlett Blake is 08/22/2018 but she states that this is too far out and her husband need to have his blood and urine checked to make sure that his electrolytes are back in balance. Please give patient wife a call if it is ok to schedule with a different provider in the office. Ph# [phone]

## 2018-08-22 ENCOUNTER — Inpatient Hospital Stay: Payer: PPO | Admitting: Family Medicine

## 2018-08-22 ENCOUNTER — Encounter

## 2018-08-25 ENCOUNTER — Emergency Department (HOSPITAL_COMMUNITY): Payer: PPO

## 2018-08-25 ENCOUNTER — Observation Stay (HOSPITAL_COMMUNITY)
Admission: EM | Admit: 2018-08-25 | Discharge: 2018-08-26 | Disposition: A | Discharge status: Home / Self Care | Payer: PPO | Attending: Internal Medicine | Admitting: Internal Medicine

## 2018-08-25 ENCOUNTER — Encounter (HOSPITAL_COMMUNITY): Payer: Self-pay | Admitting: *Deleted

## 2018-08-25 ENCOUNTER — Other Ambulatory Visit: Payer: Self-pay

## 2018-08-25 DIAGNOSIS — I480 Paroxysmal atrial fibrillation: Secondary | ICD-10-CM | POA: Diagnosis not present

## 2018-08-25 DIAGNOSIS — Z882 Allergy status to sulfonamides status: Secondary | ICD-10-CM | POA: Insufficient documentation

## 2018-08-25 DIAGNOSIS — R55 Syncope and collapse: Principal | ICD-10-CM | POA: Insufficient documentation

## 2018-08-25 DIAGNOSIS — Z885 Allergy status to narcotic agent status: Secondary | ICD-10-CM | POA: Insufficient documentation

## 2018-08-25 DIAGNOSIS — I350 Nonrheumatic aortic (valve) stenosis: Secondary | ICD-10-CM | POA: Diagnosis not present

## 2018-08-25 DIAGNOSIS — I5042 Chronic combined systolic (congestive) and diastolic (congestive) heart failure: Secondary | ICD-10-CM | POA: Diagnosis not present

## 2018-08-25 DIAGNOSIS — N400 Enlarged prostate without lower urinary tract symptoms: Secondary | ICD-10-CM | POA: Diagnosis present

## 2018-08-25 DIAGNOSIS — I110 Hypertensive heart disease with heart failure: Secondary | ICD-10-CM | POA: Diagnosis not present

## 2018-08-25 DIAGNOSIS — Z66 Do not resuscitate: Secondary | ICD-10-CM | POA: Diagnosis not present

## 2018-08-25 DIAGNOSIS — E785 Hyperlipidemia, unspecified: Secondary | ICD-10-CM | POA: Diagnosis not present

## 2018-08-25 DIAGNOSIS — I2510 Atherosclerotic heart disease of native coronary artery without angina pectoris: Secondary | ICD-10-CM | POA: Insufficient documentation

## 2018-08-25 DIAGNOSIS — E039 Hypothyroidism, unspecified: Secondary | ICD-10-CM | POA: Diagnosis not present

## 2018-08-25 DIAGNOSIS — R270 Ataxia, unspecified: Secondary | ICD-10-CM | POA: Diagnosis not present

## 2018-08-25 DIAGNOSIS — W19XXXA Unspecified fall, initial encounter: Secondary | ICD-10-CM | POA: Diagnosis not present

## 2018-08-25 DIAGNOSIS — Z79899 Other long term (current) drug therapy: Secondary | ICD-10-CM | POA: Diagnosis not present

## 2018-08-25 DIAGNOSIS — I10 Essential (primary) hypertension: Secondary | ICD-10-CM | POA: Diagnosis not present

## 2018-08-25 DIAGNOSIS — Z9861 Coronary angioplasty status: Secondary | ICD-10-CM

## 2018-08-25 LAB — COMPREHENSIVE METABOLIC PANEL
ALT: 22 U/L (ref 0–44)
AST: 24 U/L (ref 15–41)
Albumin: 3.5 g/dL (ref 3.5–5.0)
Alkaline Phosphatase: 58 U/L (ref 38–126)
Anion gap: 6 (ref 5–15)
BUN: 14 mg/dL (ref 8–23)
CO2: 28 mmol/L (ref 22–32)
Calcium: 9 mg/dL (ref 8.9–10.3)
Chloride: 102 mmol/L (ref 98–111)
Creatinine, Ser: 1.07 mg/dL (ref 0.61–1.24)
GFR calc Af Amer: >60 mL/min (ref >60)
GFR calc non Af Amer: >60 mL/min (ref >60)
GLUCOSE: 100 mg/dL — AB (ref 70–99)
Potassium: 4 mmol/L (ref 3.5–5.1)
Sodium: 136 mmol/L (ref 135–145)
Total Bilirubin: 0.7 mg/dL (ref 0.3–1.2)
Total Protein: 6.9 g/dL (ref 6.5–8.1)

## 2018-08-25 LAB — CBC WITH DIFFERENTIAL/PLATELET
Abs Immature Granulocytes: 0.03 10*3/uL (ref 0.00–0.07)
Basophils Absolute: 0.1 10*3/uL (ref 0.0–0.1)
Basophils Relative: 1 %
Eosinophils Absolute: 0.2 10*3/uL (ref 0.0–0.5)
Eosinophils Relative: 2 %
HCT: 43.7 % (ref 39.0–52.0)
Hemoglobin: 13.6 g/dL (ref 13.0–17.0)
IMMATURE GRANULOCYTES: 0 %
LYMPHS PCT: 18 %
Lymphs Abs: 1.3 10*3/uL (ref 0.7–4.0)
MCH: 28.2 pg (ref 26.0–34.0)
MCHC: 31.1 g/dL (ref 30.0–36.0)
MCV: 90.5 fL (ref 80.0–100.0)
Monocytes Absolute: 0.7 10*3/uL (ref 0.1–1.0)
Monocytes Relative: 10 %
Neutro Abs: 5 10*3/uL (ref 1.7–7.7)
Neutrophils Relative %: 69 %
Platelets: 346 10*3/uL (ref 150–400)
RBC: 4.83 MIL/uL (ref 4.22–5.81)
RDW: 14.9 % (ref 11.5–15.5)
WBC: 7.2 10*3/uL (ref 4.0–10.5)
nRBC: 0 % (ref 0.0–0.2)

## 2018-08-25 LAB — I-STAT TROPONIN, ED: Troponin i, poc: 0.01 ng/mL (ref 0.00–0.08)

## 2018-08-25 MED ORDER — LEVOTHYROXINE SODIUM 75 MCG PO TABS
75.0000 ug | ORAL_TABLET | Freq: Every day | ORAL | Status: DC
  Administered 2018-08-26: 75 ug via ORAL
  Filled 2018-08-25: qty 1

## 2018-08-25 MED ORDER — IOPAMIDOL (ISOVUE-370) INJECTION 76%
100.0000 mL | Freq: Once | INTRAVENOUS | Status: AC | PRN
  Administered 2018-08-25: 100 mL via INTRAVENOUS

## 2018-08-25 MED ORDER — MECLIZINE HCL 25 MG PO TABS: 25.0000 mg | ORAL_TABLET | Freq: Three times a day (TID) | ORAL | Status: DC | PRN

## 2018-08-25 MED ORDER — ACETAMINOPHEN 325 MG PO TABS: 650.0000 mg | ORAL_TABLET | Freq: Four times a day (QID) | ORAL | Status: DC | PRN

## 2018-08-25 MED ORDER — ENOXAPARIN SODIUM 40 MG/0.4ML ~~LOC~~ SOLN
40.0000 mg | Freq: Every day | SUBCUTANEOUS | Status: DC
  Administered 2018-08-26: 40 mg via SUBCUTANEOUS
  Filled 2018-08-25: qty 0.4

## 2018-08-25 MED ORDER — ONDANSETRON HCL 4 MG/2ML IJ SOLN: 4.0000 mg | Freq: Four times a day (QID) | INTRAMUSCULAR | Status: DC | PRN

## 2018-08-25 MED ORDER — DOCUSATE SODIUM 100 MG PO CAPS
100.0000 mg | ORAL_CAPSULE | Freq: Every day | ORAL | Status: DC
  Administered 2018-08-26: 100 mg via ORAL
  Filled 2018-08-25: qty 1

## 2018-08-25 MED ORDER — SODIUM CHLORIDE 0.9% FLUSH
3.0000 mL | Freq: Two times a day (BID) | INTRAVENOUS | Status: DC
  Administered 2018-08-26: 3 mL via INTRAVENOUS

## 2018-08-25 MED ORDER — PRAVASTATIN SODIUM 10 MG PO TABS: 20.0000 mg | ORAL_TABLET | Freq: Every day | ORAL | Status: DC

## 2018-08-25 MED ORDER — ACETAMINOPHEN 650 MG RE SUPP: 650.0000 mg | Freq: Four times a day (QID) | RECTAL | Status: DC | PRN

## 2018-08-25 MED ORDER — POLYETHYLENE GLYCOL 3350 17 G PO PACK: 17.0000 g | PACK | Freq: Every day | ORAL | Status: DC | PRN

## 2018-08-25 MED ORDER — CLOPIDOGREL BISULFATE 75 MG PO TABS
75.0000 mg | ORAL_TABLET | Freq: Every day | ORAL | Status: DC
  Administered 2018-08-26: 75 mg via ORAL
  Filled 2018-08-25: qty 1

## 2018-08-25 MED ORDER — FINASTERIDE 5 MG PO TABS
5.0000 mg | ORAL_TABLET | Freq: Every day | ORAL | Status: DC
  Administered 2018-08-26: 5 mg via ORAL
  Filled 2018-08-25: qty 1

## 2018-08-25 MED ORDER — SODIUM CHLORIDE 0.9 % IV SOLN: 250.0000 mL | INTRAVENOUS | Status: DC | PRN

## 2018-08-25 MED ORDER — AMIODARONE HCL 200 MG PO TABS: 200.0000 mg | ORAL_TABLET | Freq: Every day | ORAL | Status: DC

## 2018-08-25 MED ORDER — SODIUM CHLORIDE 0.9% FLUSH: 3.0000 mL | INTRAVENOUS | Status: DC | PRN

## 2018-08-25 MED ORDER — SODIUM CHLORIDE 0.9% FLUSH: 3.0000 mL | Freq: Two times a day (BID) | INTRAVENOUS | Status: DC

## 2018-08-25 MED ORDER — AMLODIPINE BESYLATE 2.5 MG PO TABS
2.5000 mg | ORAL_TABLET | Freq: Every day | ORAL | Status: DC
  Administered 2018-08-26: 2.5 mg via ORAL
  Filled 2018-08-25: qty 1

## 2018-08-25 MED ORDER — ONDANSETRON HCL 4 MG PO TABS: 4.0000 mg | ORAL_TABLET | Freq: Four times a day (QID) | ORAL | Status: DC | PRN

## 2018-08-25 NOTE — ED Notes (Signed)
Pt assisted in using urinal. Pt stood but very unsteady and seemed to be weak.

## 2018-08-25 NOTE — ED Notes (Signed)
Wife is going home and would like to be contacted when pt goes upstairs.

## 2018-08-25 NOTE — ED Provider Notes (Signed)
[facility] EMERGENCY DEPARTMENT Provider Note   CSN: [id number] Arrival date & time: 08/25/18  1539    History   Chief Complaint Chief Complaint  Patient presents with  . Fall    HPI Justin [name] is a 85 y.o. male.      Loss of Consciousness  Episode history:  Single Most recent episode:  Today Timing:  Rare Progression:  Resolved Chronicity:  New Context: not bowel movement, not dehydration and not medication change   Context comment:  Walking across the room Witnessed: no   Relieved by:  None tried Worsened by:  Nothing Ineffective treatments:  None tried Associated symptoms: no chest pain, no difficulty breathing, no fever, no focal weakness, no palpitations, no seizures, no shortness of breath, no visual change, no vomiting and no weakness     Past Medical History:  Diagnosis Date  . Basal cell carcinoma   . BPH (benign prostatic hypertrophy)   . CAD (coronary artery disease) 1991   a. H/o MI w/ PTCA;  b. s/p CABG by Dr Roxan Hockey w/ LIMA-LAD, SVG-OM1-OM3, SVG-D1, SVG-PDA  . CHF (congestive heart failure) ([place])   . Complication of anesthesia    Wife reports history of confusion after last surgery in 10/2016-lasted a few days  . HTN (hypertension)   . Hyperlipidemia   . Internal hemorrhoids   . Moderate aortic stenosis    a. 08/2014 Echo: EF 55-60%, no rwma, Gr 1 DD, mod AS, mildly dil asc Ao, mild MR, sev dil LA.  . NSTEMI (non-ST elevated myocardial infarction) ([place]) 05/2017    PCI with DES to SVG-OM1. SVG-OM3 occluded, LIMA-LAD and SVG-RCA patent  . PAF (paroxysmal atrial fibrillation) ([place])    a. Dx 08/2014;  b. CHA2DS2VASc = 4 - decision made to withold [place] 2/2 unsteady gait and syncope.  . Sigmoid diverticulitis   . Syncope    a. 08/2014 - ? orthostatic    Patient Active Problem List   Diagnosis Date Noted  . Hypothyroidism 08/25/2018  . Chronic combined systolic and diastolic CHF (congestive heart failure) ([place]) 08/25/2018  .  Sundowning 06/24/2017  . Ischemic cardiomyopathy 06/24/2017  . Anticoagulated 06/24/2017  . Syncope   . Sigmoid diverticulitis   . Internal hemorrhoids   . HTN (hypertension)   . Complication of anesthesia   . CHF (congestive heart failure) ([place])   . Basal cell carcinoma   . NSTEMI (non-ST elevated myocardial infarction) ([place])   . Chest pain 06/10/2017  . Unstable angina ([place]) 06/10/2017  . UTI (urinary tract infection) 11/24/2016  . Sepsis ([place]) 11/24/2016  . Generalized weakness 11/24/2016  . Hyponatremia 11/24/2016  . BPH with urinary obstruction 11/04/2016  . H/O prostatectomy 11/04/2016  . Fall   . Closed left hip fracture ([place]) 10/13/2015  . Medicare annual wellness visit, subsequent 03/16/2015  . Paroxysmal atrial fibrillation ([place]) 09/26/2014  . Orthostatic hypotension 09/15/2014  . Head trauma 09/14/2014  . Alteration in anticoagulation 09/14/2014  . Tinnitus 03/21/2014  . Epigastric pain 02/01/2013  . Gait instability 01/01/2013  . Disequilibrium 04/03/2012  . Hyperglycemia 09/13/2011  . Aortic stenosis 03/10/2011  . CAD S/P percutaneous coronary angioplasty 02/23/2010  . Hyperlipidemia 07/04/2008  . Essential hypertension 07/04/2008  . RHINITIS 07/04/2008  . BPH (benign prostatic hyperplasia) 07/04/2008  . Hx of CABG 06/28/1989    Past Surgical History:  Procedure Laterality Date  . CARDIAC CATHETERIZATION    . COLONOSCOPY    . CORONARY ARTERY BYPASS GRAFT  2000  Dr Malvin Johns, SVG-OM1-OM3, SVG-D1, SVG-PDA.   [name] CORONARY STENT INTERVENTION N/A 06/13/2017   Procedure: CORONARY STENT INTERVENTION;  Surgeon: Lorretta Harp, MD;  Location: [facility];  Service: Cardiovascular;  Laterality: N/A;  . CYSTOSCOPY N/A 12/07/2016   Procedure: CYSTOSCOPY;  Surgeon: Cleon Gustin, MD;  Location: [facility];  Service: Urology;  Laterality: N/A;  NEEDS 30 MIN TOTAL FOR SURGERY  . CYSTOSCOPY WITH URETHRAL DILATATION N/A 02/21/2017   Procedure:  DILATATION OF BLADDER NECK WITH INJECTION OF MITOMYCIN C;  Surgeon: Cleon Gustin, MD;  Location: [facility];  Service: Urology;  Laterality: N/A;  . EYE SURGERY     bil cataract  . HIP ARTHROPLASTY Left 10/14/2015   Procedure: LEFT HIP HEMI ARTHROPLASTY;  Surgeon: [name], MD;  Location: [facility];  Service: Orthopedics;  Laterality: Left;  . LEFT HEART CATH AND CORS/GRAFTS ANGIOGRAPHY N/A 06/13/2017   Procedure: LEFT HEART CATH AND CORS/GRAFTS ANGIOGRAPHY;  Surgeon: Lorretta Harp, MD;  Location: [facility];  Service: Cardiovascular;  Laterality: N/A;  . SHOULDER SURGERY     x2  . TONSILLECTOMY    . TRANSTHORACIC ECHOCARDIOGRAM  10-06-2016   dr Stanford Breed   mild LVH, ef 40-45%, diffuse hypokinesis/ moderate AV calcification leaflets and annulus,  severe thickened leaflets, mod.-sev. aortic stenosis (Valva area 0.61cm^2, mean grandient 81mmHg, peak gradiant 63mmHg)/  moderate thickened , mild calcified MV leaflets without stenosis, moderate MR/  severe LAE/ trivial PR and TR  . TRANSURETHRAL RESECTION OF BLADDER NECK N/A 12/07/2016   Procedure: TRANSURETHRAL RESECTION OF BLADDER NECK;  Surgeon: Cleon Gustin, MD;  Location: [facility];  Service: Urology;  Laterality: N/A;  . XI ROBOTIC ASSISTED SIMPLE PROSTATECTOMY N/A 11/04/2016   Procedure: XI ROBOTIC ASSISTED SIMPLE PROSTATECTOMY;  Surgeon: Cleon Gustin, MD;  Location: [facility];  Service: Urology;  Laterality: N/A;        Home Medications    Prior to Admission medications   Medication Sig Start Date End Date Taking? Authorizing Provider  acetaminophen (TYLENOL) 500 MG tablet Take 500 mg by mouth daily as needed for moderate pain.   Yes [provider]  amiodarone (PACERONE) 200 MG tablet Take 200 mg by mouth daily after supper.    Yes [provider]  amLODipine (NORVASC) 2.5 MG tablet Take 1 tablet (2.5 mg total) by mouth daily. Patient taking differently: Take 2.5 mg by mouth daily after  breakfast.  03/27/18  Yes Crenshaw, Denice Bors, MD  beta carotene w/minerals (OCUVITE) tablet Take 1 tablet by mouth daily after breakfast.    Yes [provider]  Carboxymethylcellulose Sodium (THERATEARS OP) Place 1 drop into both eyes daily as needed (dry eyes).    Yes [provider]  cefadroxil (DURICEF) 500 MG capsule Take 500 mg by mouth 2 (two) times daily.   Yes [provider]  clindamycin-tretinoin Pershing Proud) gel Apply 1 application topically 2 (two) times daily. Apply to scalp   Yes [provider]  clopidogrel (PLAVIX) 75 MG tablet Take 1 tablet (75 mg total) by mouth daily with breakfast. Patient taking differently: Take 75 mg by mouth daily after breakfast.  07/10/18  Yes Crenshaw, Denice Bors, MD  docusate sodium (COLACE) 100 MG capsule Take 100 mg by mouth daily after breakfast.    Yes [provider]  ferrous sulfate 325 (65 FE) MG tablet Take 325 mg by mouth daily after supper.    Yes [provider]  finasteride (PROSCAR) 5 MG  tablet Take 5 mg by mouth daily after breakfast.    Yes [provider]  levothyroxine (SYNTHROID, LEVOTHROID) 75 MCG tablet Take 1 tablet (75 mcg total) by mouth daily at 6 (six) AM. Patient taking differently: Take 75 mcg by mouth daily before breakfast.  07/28/18  Yes Fuller Plan A, MD  lovastatin (MEVACOR) 20 MG tablet Take 1 tablet (20 mg total) by mouth daily at 6 PM. Patient taking differently: Take 20 mg by mouth daily after supper.  03/03/18  Yes Lelon Perla, MD  meclizine (ANTIVERT) 25 MG tablet Take 1 tablet (25 mg total) by mouth 3 (three) times daily as needed for dizziness. 08/05/16  Yes Fredia Sorrow, MD  Multiple Vitamin (MULTIVITAMIN WITH MINERALS) TABS tablet Take 1 tablet by mouth daily after breakfast.    Yes [provider]  nitroGLYCERIN (NITROSTAT) 0.4 MG SL tablet Place 1 tablet (0.4 mg total) under the tongue every 5 (five) minutes as needed for chest pain. 06/14/18   Yes Lelon Perla, MD  Probiotic Product (PROBIOTIC DAILY PO) Take 1 capsule by mouth daily after breakfast.    Yes [provider]    Family History Family History  Problem Relation Age of Onset  . Coronary artery disease Father   . Hyperlipidemia Father   . Heart attack Father   . Alcohol abuse Father   . Bipolar disorder Mother   . Heart disease Mother   . Alcohol abuse Brother   . Cancer Brother   . Cancer Maternal Grandfather        oral cancer, chew tobacco  . Cancer Sister   . Glaucoma Sister   . Alcohol abuse Other     Social History Social History   Tobacco Use  . Smoking status: Former Smoker    Packs/day: 0.50    Years: 15.00    Pack years: 7.50    Last attempt to quit: 06/29/1971    Years since quitting: 47.1  . Smokeless tobacco: Never Used  Substance Use Topics  . Alcohol use: Yes    Alcohol/week: 1.0 - 2.0 standard drinks    Types: 1 - 2 Glasses of wine per week    Comment: alternates wine and liquor. 1/2 pint liquor/week  . Drug use: No     Allergies   Morphine and related; Diltiazem hcl; Metoprolol; Toradol [ketorolac tromethamine]; Hydrocodone; Sulfa antibiotics; and Dutasteride   Review of Systems Review of Systems  Constitutional: Negative for chills and fever.  HENT: Negative for ear pain and sore throat.   Eyes: Negative for pain and visual disturbance.  Respiratory: Negative for cough and shortness of breath.   Cardiovascular: Positive for syncope. Negative for chest pain and palpitations.  Gastrointestinal: Negative for abdominal pain and vomiting.  Genitourinary: Negative for dysuria and hematuria.  Musculoskeletal: Negative for arthralgias and back pain.  Skin: Negative for color change and rash.  Neurological: Negative for focal weakness, seizures, syncope and weakness.  All other systems reviewed and are negative.    Physical Exam Updated Vital Signs BP (!) 142/89 (BP Location: Right Arm)   Pulse 72   Temp 98 F  (36.7 C) (Oral)   Resp 18   Ht 5\' 5"  (1.651 m)   Wt 65.8 kg   SpO2 99%   BMI 24.13 kg/m   Physical Exam Vitals signs and nursing note reviewed.  Constitutional:      Appearance: He is well-developed.     Comments: Patient resting comfortably on assessment, hemodynamically stable, GCS  15  HENT:     Head: Normocephalic and atraumatic.  Eyes:     Conjunctiva/sclera: Conjunctivae normal.  Neck:     Musculoskeletal: Neck supple.  Cardiovascular:     Rate and Rhythm: Normal rate and regular rhythm.     Heart sounds: No murmur.  Pulmonary:     Effort: Pulmonary effort is normal. No respiratory distress.     Breath sounds: Normal breath sounds.  Abdominal:     Palpations: Abdomen is soft.     Tenderness: There is no abdominal tenderness.  Musculoskeletal: Normal range of motion.        General: No tenderness or signs of injury.     Right lower leg: No edema.     Left lower leg: No edema.  Skin:    General: Skin is warm and dry.     Capillary Refill: Capillary refill takes less than 2 seconds.  Neurological:     General: No focal deficit present.     Mental Status: He is alert and oriented to person, place, and time. Mental status is at baseline.     Cranial Nerves: No cranial nerve deficit.     Sensory: No sensory deficit.     Motor: No weakness.     Coordination: Coordination normal.     Gait: Gait normal.     Deep Tendon Reflexes: Reflexes normal.  Psychiatric:        Mood and Affect: Mood normal.      ED Treatments / Results  Labs (all labs ordered are listed, but only abnormal results are displayed) Labs Reviewed  COMPREHENSIVE METABOLIC PANEL - Abnormal; Notable for the following components:      Result Value   Glucose, Bld 100 (*)    All other components within normal limits  CBC WITH DIFFERENTIAL/PLATELET  BASIC METABOLIC PANEL  CBC  I-STAT TROPONIN, ED    EKG EKG Interpretation  Date/Time:  Friday August 25 2018 15:49:11 EST Ventricular Rate:   61 PR Interval:    QRS Duration: 134 QT Interval:  473 QTC Calculation: 477 R Axis:   -51 Text Interpretation:  Sinus rhythm IVCD, consider atypical RBBB LVH with IVCD and secondary repol abnrm Borderline prolonged QT interval almost exactly the same as 08/13/2018 Confirmed by Merrily Pew [phone]) on 08/25/2018 3:55:41 PM   Radiology Ct Angio Head W Or Wo Contrast  Result Date: 08/25/2018 CLINICAL DATA:  Ataxia with suspected stroke EXAM: CT ANGIOGRAPHY HEAD AND NECK TECHNIQUE: Multidetector CT imaging of the head and neck was performed using the standard protocol during bolus administration of intravenous contrast. Multiplanar CT image reconstructions and MIPs were obtained to evaluate the vascular anatomy. Carotid stenosis measurements (when applicable) are obtained utilizing NASCET criteria, using the distal internal carotid diameter as the denominator. CONTRAST:  182mL ISOVUE-370 IOPAMIDOL (ISOVUE-370) INJECTION 76% COMPARISON:  Head CT 08/13/2018 Brain MRI 08/13/2018 FINDINGS: CT HEAD FINDINGS Brain: There is no mass, hemorrhage or extra-axial collection. The size and configuration of the ventricles and extra-axial CSF spaces are normal. There is no acute or chronic infarction. The brain parenchyma is normal. Skull: The visualized skull base, calvarium and extracranial soft tissues are normal. Sinuses/Orbits: No fluid levels or advanced mucosal thickening of the visualized paranasal sinuses. No mastoid or middle ear effusion. The orbits are normal. CTA NECK FINDINGS SKELETON: There is no bony spinal canal stenosis. No lytic or blastic lesion. OTHER NECK: Normal pharynx, larynx and major salivary glands. No cervical lymphadenopathy. Unremarkable thyroid gland. UPPER CHEST: Numerous calcified  mediastinal lymph nodes. AORTIC ARCH: There is mild calcific atherosclerosis of the aortic arch. There is no aneurysm, dissection or hemodynamically significant stenosis of the visualized ascending aorta and  aortic arch. Conventional 3 vessel aortic branching pattern. The visualized proximal subclavian arteries are widely patent. RIGHT CAROTID SYSTEM: --Common carotid artery: Widely patent origin without common carotid artery dissection or aneurysm. --Internal carotid artery: Normal without aneurysm, dissection or stenosis. --External carotid artery: No acute abnormality. LEFT CAROTID SYSTEM: --Common carotid artery: Widely patent origin without common carotid artery dissection or aneurysm. --Internal carotid artery: Normal without aneurysm, dissection or stenosis. --External carotid artery: No acute abnormality. VERTEBRAL ARTERIES: Right dominant configuration. Both origins are normal. No dissection, occlusion or flow-limiting stenosis to the vertebrobasilar confluence. CTA HEAD FINDINGS POSTERIOR CIRCULATION: --Basilar artery: Normal. --Posterior cerebral arteries: Normal. Both originate from the basilar artery. --Superior cerebellar arteries: Normal. --Inferior cerebellar arteries: Normal anterior and posterior inferior cerebellar arteries. ANTERIOR CIRCULATION: --Intracranial internal carotid arteries: Atherosclerotic calcification of the internal carotid arteries at the skull base without hemodynamically significant stenosis. --Anterior cerebral arteries: Normal. Both A1 segments are present. Patent anterior communicating artery. --Middle cerebral arteries: Normal. --Posterior communicating arteries: Absent bilaterally. VENOUS SINUSES: As permitted by contrast timing, patent. ANATOMIC VARIANTS: None DELAYED PHASE: No parenchymal contrast enhancement. Review of the MIP images confirms the above findings. IMPRESSION: 1. No emergent large vessel occlusion or hemodynamically significant stenosis of the arteries of the head and neck. 2. Numerous calcified mediastinal lymph nodes, possibly a sequela of prior granulomatous infection. 3.  Aortic atherosclerosis (ICD10-I70.0). Electronically Signed   By: Ulyses Jarred M.D.    On: 08/25/2018 19:52   Ct Angio Neck W And/or Wo Contrast  Result Date: 08/25/2018 CLINICAL DATA:  Ataxia with suspected stroke EXAM: CT ANGIOGRAPHY HEAD AND NECK TECHNIQUE: Multidetector CT imaging of the head and neck was performed using the standard protocol during bolus administration of intravenous contrast. Multiplanar CT image reconstructions and MIPs were obtained to evaluate the vascular anatomy. Carotid stenosis measurements (when applicable) are obtained utilizing NASCET criteria, using the distal internal carotid diameter as the denominator. CONTRAST:  159mL ISOVUE-370 IOPAMIDOL (ISOVUE-370) INJECTION 76% COMPARISON:  Head CT 08/13/2018 Brain MRI 08/13/2018 FINDINGS: CT HEAD FINDINGS Brain: There is no mass, hemorrhage or extra-axial collection. The size and configuration of the ventricles and extra-axial CSF spaces are normal. There is no acute or chronic infarction. The brain parenchyma is normal. Skull: The visualized skull base, calvarium and extracranial soft tissues are normal. Sinuses/Orbits: No fluid levels or advanced mucosal thickening of the visualized paranasal sinuses. No mastoid or middle ear effusion. The orbits are normal. CTA NECK FINDINGS SKELETON: There is no bony spinal canal stenosis. No lytic or blastic lesion. OTHER NECK: Normal pharynx, larynx and major salivary glands. No cervical lymphadenopathy. Unremarkable thyroid gland. UPPER CHEST: Numerous calcified mediastinal lymph nodes. AORTIC ARCH: There is mild calcific atherosclerosis of the aortic arch. There is no aneurysm, dissection or hemodynamically significant stenosis of the visualized ascending aorta and aortic arch. Conventional 3 vessel aortic branching pattern. The visualized proximal subclavian arteries are widely patent. RIGHT CAROTID SYSTEM: --Common carotid artery: Widely patent origin without common carotid artery dissection or aneurysm. --Internal carotid artery: Normal without aneurysm, dissection or  stenosis. --External carotid artery: No acute abnormality. LEFT CAROTID SYSTEM: --Common carotid artery: Widely patent origin without common carotid artery dissection or aneurysm. --Internal carotid artery: Normal without aneurysm, dissection or stenosis. --External carotid artery: No acute abnormality. VERTEBRAL ARTERIES: Right dominant configuration. Both origins are normal.  No dissection, occlusion or flow-limiting stenosis to the vertebrobasilar confluence. CTA HEAD FINDINGS POSTERIOR CIRCULATION: --Basilar artery: Normal. --Posterior cerebral arteries: Normal. Both originate from the basilar artery. --Superior cerebellar arteries: Normal. --Inferior cerebellar arteries: Normal anterior and posterior inferior cerebellar arteries. ANTERIOR CIRCULATION: --Intracranial internal carotid arteries: Atherosclerotic calcification of the internal carotid arteries at the skull base without hemodynamically significant stenosis. --Anterior cerebral arteries: Normal. Both A1 segments are present. Patent anterior communicating artery. --Middle cerebral arteries: Normal. --Posterior communicating arteries: Absent bilaterally. VENOUS SINUSES: As permitted by contrast timing, patent. ANATOMIC VARIANTS: None DELAYED PHASE: No parenchymal contrast enhancement. Review of the MIP images confirms the above findings. IMPRESSION: 1. No emergent large vessel occlusion or hemodynamically significant stenosis of the arteries of the head and neck. 2. Numerous calcified mediastinal lymph nodes, possibly a sequela of prior granulomatous infection. 3.  Aortic atherosclerosis (ICD10-I70.0). Electronically Signed   By: Ulyses Jarred M.D.   On: 08/25/2018 19:52    Procedures Procedures (including critical care time)  Medications Ordered in ED Medications  amiodarone (PACERONE) tablet 200 mg (has no administration in time range)  amLODipine (NORVASC) tablet 2.5 mg (has no administration in time range)  pravastatin (PRAVACHOL) tablet 20  mg (has no administration in time range)  levothyroxine (SYNTHROID, LEVOTHROID) tablet 75 mcg (has no administration in time range)  docusate sodium (COLACE) capsule 100 mg (has no administration in time range)  meclizine (ANTIVERT) tablet 25 mg (has no administration in time range)  finasteride (PROSCAR) tablet 5 mg (has no administration in time range)  clopidogrel (PLAVIX) tablet 75 mg (has no administration in time range)  sodium chloride flush (NS) 0.9 % injection 3 mL (has no administration in time range)  enoxaparin (LOVENOX) injection 40 mg (has no administration in time range)  sodium chloride flush (NS) 0.9 % injection 3 mL (has no administration in time range)  sodium chloride flush (NS) 0.9 % injection 3 mL (has no administration in time range)  0.9 %  sodium chloride infusion (has no administration in time range)  acetaminophen (TYLENOL) tablet 650 mg (has no administration in time range)    Or  acetaminophen (TYLENOL) suppository 650 mg (has no administration in time range)  polyethylene glycol (MIRALAX / GLYCOLAX) packet 17 g (has no administration in time range)  ondansetron (ZOFRAN) tablet 4 mg (has no administration in time range)    Or  ondansetron (ZOFRAN) injection 4 mg (has no administration in time range)  cephALEXin (KEFLEX) capsule 500 mg (has no administration in time range)  iopamidol (ISOVUE-370) 76 % injection 100 mL (100 mLs Intravenous Contrast Given 08/25/18 1915)     Initial Impression / Assessment and Plan / ED Course  I have reviewed the triage vital signs and the nursing notes.  Pertinent labs & imaging results that were available during my care of the patient were reviewed by me and considered in my medical decision making (see chart for details).        85 year old male with a history of coronary artery disease, CHF, hypertension, hyperlipidemia, aortic stenosis, cardiac old CABG and recent stent back in December who presents with a syncopal  episode which was sudden on onset without any vertigo or prodromal symptoms according to the patient.  Patient has been dealing with vertigo over the last few months and is currently on meclizine however patient indicates that this is not the same symptoms which happened today.  Patient denies any pain, neurological symptoms, infectious-like symptoms.  In addition denies any chest pain,  shortness of breath.  Concern for arrhythmia, ACS, vasovagal syncope and other etiologies causing consciousness at this time.  Will obtain laboratory studies, CTA head which indicates no posterior circulation insufficiency.  Laboratory studies reviewed, using decision-making regarding management.  Will admit to the inpatient team for continued work-up and evaluation of syncope.  Patient and family in agreement with this plan.  Patient likely need echocardiogram and other work-up and evaluation as well as continued monitoring overnight.  The above care was discussed and agreed upon by my attending physician.  Final Clinical Impressions(s) / ED Diagnoses   Final diagnoses:  Syncope, unspecified syncope type    ED Discharge Orders    None       Orson Aloe, MD 08/26/18 1712    Merrily Pew, MD 09/05/18 [phone]

## 2018-08-25 NOTE — ED Triage Notes (Signed)
The pt arrived from [facility] he fell today does not remewmber the fall  No complaints of pain anywhere alert oriented skin warm and dry

## 2018-08-25 NOTE — ED Notes (Signed)
To ct

## 2018-08-26 ENCOUNTER — Other Ambulatory Visit: Payer: Self-pay

## 2018-08-26 DIAGNOSIS — R55 Syncope and collapse: Secondary | ICD-10-CM

## 2018-08-26 DIAGNOSIS — I480 Paroxysmal atrial fibrillation: Secondary | ICD-10-CM | POA: Diagnosis not present

## 2018-08-26 DIAGNOSIS — I2510 Atherosclerotic heart disease of native coronary artery without angina pectoris: Secondary | ICD-10-CM | POA: Diagnosis not present

## 2018-08-26 LAB — GLUCOSE, CAPILLARY: Glucose-Capillary: 95 mg/dL (ref 70–99)

## 2018-08-26 LAB — CBC
HCT: 38.3 % — ABNORMAL LOW (ref 39.0–52.0)
Hemoglobin: 13.1 g/dL (ref 13.0–17.0)
MCH: 29.6 pg (ref 26.0–34.0)
MCHC: 34.2 g/dL (ref 30.0–36.0)
MCV: 86.5 fL (ref 80.0–100.0)
Platelets: 333 10*3/uL (ref 150–400)
RBC: 4.43 MIL/uL (ref 4.22–5.81)
RDW: 14.9 % (ref 11.5–15.5)
WBC: 8.4 10*3/uL (ref 4.0–10.5)
nRBC: 0 % (ref 0.0–0.2)

## 2018-08-26 LAB — BASIC METABOLIC PANEL
Anion gap: 7 (ref 5–15)
BUN: 10 mg/dL (ref 8–23)
CHLORIDE: 105 mmol/L (ref 98–111)
CO2: 23 mmol/L (ref 22–32)
Calcium: 8.7 mg/dL — ABNORMAL LOW (ref 8.9–10.3)
Creatinine, Ser: 1.01 mg/dL (ref 0.61–1.24)
GFR calc Af Amer: >60 mL/min (ref >60)
GFR calc non Af Amer: >60 mL/min (ref >60)
Glucose, Bld: 95 mg/dL (ref 70–99)
Potassium: 3.7 mmol/L (ref 3.5–5.1)
Sodium: 135 mmol/L (ref 135–145)

## 2018-08-26 MED ORDER — SODIUM CHLORIDE 0.9 % IV SOLN
INTRAVENOUS | Status: DC
  Administered 2018-08-26: 02:00:00 via INTRAVENOUS

## 2018-08-26 MED ORDER — CEPHALEXIN 250 MG PO CAPS
500.0000 mg | ORAL_CAPSULE | Freq: Two times a day (BID) | ORAL | Status: DC
  Administered 2018-08-26 (×2): 500 mg via ORAL
  Filled 2018-08-26 (×2): qty 2

## 2018-08-26 NOTE — ED Notes (Signed)
Admitting doctor at bedside 

## 2018-08-26 NOTE — H&P (Addendum)
History and Physical    Justin [name] IZT:[id number] DOB: 01/24/1929 DOA: 08/25/2018  PCP: Mosie Lukes, MD   Patient coming from: [facility]   Chief Complaint: Syncope   HPI: Justin [name] is a 85 y.o. male with medical history significant for CAD, chronic combined systolic and diastolic CHF, hypertension, vertigo, hypothyroidism, recurrent UTI, and some memory loss, now presenting to the emergency department after syncopal episode.  The patient reports that he was in his usual state, having an uneventful day, and was walking across a carpeted floor when he had a sudden episode of syncope.  Patient denies any lightheadedness, denies any chest pain or palpitations, has not been short of breath or coughing, denies leg swelling or tenderness, and denies any melena or hematochezia.  Reports that he had no warning or preceding symptoms before the episode.  He seemed to return to his usual state rapidly and was able to call for help.  He denies any headache or any other pain or injury related to this.  ED Course: Upon arrival to the ED, patient is found to be afebrile, saturating well on room air, and with vitals otherwise stable.  EKG features a sinus rhythm with IVCD, LVH with repolarization abnormality, similar to prior.  CTA head and neck was obtained in the ED and negative for emergent LVO or hemodynamically significant stenosis.  Chemistry panel and CBC are unremarkable and troponin is normal.  Patient remained hemodynamically stable in the ED with no significant arrhythmia on cardiac monitoring.  He will be observed for further evaluation and management.  Review of Systems:  All other systems reviewed and apart from HPI, are negative.  Past Medical History:  Diagnosis Date  . Basal cell carcinoma   . BPH (benign prostatic hypertrophy)   . CAD (coronary artery disease) 1991   a. H/o MI w/ PTCA;  b. s/p CABG by Dr Roxan Hockey w/ LIMA-LAD, SVG-OM1-OM3, SVG-D1, SVG-PDA  . CHF (congestive heart  failure) ([place])   . Complication of anesthesia    Wife reports history of confusion after last surgery in 10/2016-lasted a few days  . HTN (hypertension)   . Hyperlipidemia   . Internal hemorrhoids   . Moderate aortic stenosis    a. 08/2014 Echo: EF 55-60%, no rwma, Gr 1 DD, mod AS, mildly dil asc Ao, mild MR, sev dil LA.  . NSTEMI (non-ST elevated myocardial infarction) ([place]) 05/2017    PCI with DES to SVG-OM1. SVG-OM3 occluded, LIMA-LAD and SVG-RCA patent  . PAF (paroxysmal atrial fibrillation) ([place])    a. Dx 08/2014;  b. CHA2DS2VASc = 4 - decision made to withold [place] 2/2 unsteady gait and syncope.  . Sigmoid diverticulitis   . Syncope    a. 08/2014 - ? orthostatic    Past Surgical History:  Procedure Laterality Date  . CARDIAC CATHETERIZATION    . COLONOSCOPY    . CORONARY ARTERY BYPASS GRAFT  2000   Dr Roxan Hockey  LIMA-LAD, SVG-OM1-OM3, SVG-D1, SVG-PDA.   [name] CORONARY STENT INTERVENTION N/A 06/13/2017   Procedure: CORONARY STENT INTERVENTION;  Surgeon: Lorretta Harp, MD;  Location: [facility];  Service: Cardiovascular;  Laterality: N/A;  . CYSTOSCOPY N/A 12/07/2016   Procedure: CYSTOSCOPY;  Surgeon: Cleon Gustin, MD;  Location: [facility];  Service: Urology;  Laterality: N/A;  NEEDS 30 MIN TOTAL FOR SURGERY  . CYSTOSCOPY WITH URETHRAL DILATATION N/A 02/21/2017   Procedure: DILATATION OF BLADDER NECK WITH INJECTION OF MITOMYCIN C;  Surgeon: Cleon Gustin, MD;  Location: [facility];  Service: Urology;  Laterality: N/A;  . EYE SURGERY     bil cataract  . HIP ARTHROPLASTY Left 10/14/2015   Procedure: LEFT HIP HEMI ARTHROPLASTY;  Surgeon: [name], MD;  Location: [facility];  Service: Orthopedics;  Laterality: Left;  . LEFT HEART CATH AND CORS/GRAFTS ANGIOGRAPHY N/A 06/13/2017   Procedure: LEFT HEART CATH AND CORS/GRAFTS ANGIOGRAPHY;  Surgeon: Lorretta Harp, MD;  Location: [facility];  Service: Cardiovascular;  Laterality: N/A;  . SHOULDER SURGERY     x2  .  TONSILLECTOMY    . TRANSTHORACIC ECHOCARDIOGRAM  10-06-2016   dr Stanford Breed   mild LVH, ef 40-45%, diffuse hypokinesis/ moderate AV calcification leaflets and annulus,  severe thickened leaflets, mod.-sev. aortic stenosis (Valva area 0.61cm^2, mean grandient 10mmHg, peak gradiant 26mmHg)/  moderate thickened , mild calcified MV leaflets without stenosis, moderate MR/  severe LAE/ trivial PR and TR  . TRANSURETHRAL RESECTION OF BLADDER NECK N/A 12/07/2016   Procedure: TRANSURETHRAL RESECTION OF BLADDER NECK;  Surgeon: Cleon Gustin, MD;  Location: [facility];  Service: Urology;  Laterality: N/A;  . XI ROBOTIC ASSISTED SIMPLE PROSTATECTOMY N/A 11/04/2016   Procedure: XI ROBOTIC ASSISTED SIMPLE PROSTATECTOMY;  Surgeon: Cleon Gustin, MD;  Location: [facility];  Service: Urology;  Laterality: N/A;     reports that he quit smoking about 47 years ago. He has a 7.50 pack-year smoking history. He has never used smokeless tobacco. He reports current alcohol use of about 1.0 - 2.0 standard drinks of alcohol per week. He reports that he does not use drugs.  Allergies  Allergen Reactions  . Morphine And Related Other (See Comments)    Severe confusion  . Diltiazem Hcl Other (See Comments)    bradycardia  . Metoprolol Other (See Comments)    bradycardia  . Toradol [Ketorolac Tromethamine] Other (See Comments)    Dizzy and light headed  . Hydrocodone Other (See Comments)    hallucinations  . Sulfa Antibiotics Other (See Comments)    Caused extreme weakness/low sodium  Feb. 2020 per wife  . Dutasteride Diarrhea    GI upset and incontinence    Family History  Problem Relation Age of Onset  . Coronary artery disease Father   . Hyperlipidemia Father   . Heart attack Father   . Alcohol abuse Father   . Bipolar disorder Mother   . Heart disease Mother   . Alcohol abuse Brother   . Cancer Brother   . Cancer Maternal Grandfather        oral cancer, chew tobacco  . Cancer Sister   . Glaucoma  Sister   . Alcohol abuse Other      Prior to Admission medications   Medication Sig Start Date End Date Taking? Authorizing Provider  acetaminophen (TYLENOL) 500 MG tablet Take 500 mg by mouth daily as needed for moderate pain.   Yes [provider]  amiodarone (PACERONE) 200 MG tablet Take 200 mg by mouth daily after supper.    Yes [provider]  amLODipine (NORVASC) 2.5 MG tablet Take 1 tablet (2.5 mg total) by mouth daily. Patient taking differently: Take 2.5 mg by mouth daily after breakfast.  03/27/18  Yes Crenshaw, Denice Bors, MD  beta carotene w/minerals (OCUVITE) tablet Take 1 tablet by mouth daily after breakfast.    Yes [provider]  Carboxymethylcellulose Sodium (THERATEARS OP) Place 1 drop into both eyes daily as needed (dry eyes).    Yes [provider]  cefadroxil (DURICEF) 500 MG capsule Take 500 mg by mouth 2 (two) times daily.   Yes [provider]  clindamycin-tretinoin Pershing Proud) gel Apply 1 application topically 2 (two) times daily. Apply to scalp   Yes [provider]  clopidogrel (PLAVIX) 75 MG tablet Take 1 tablet (75 mg total) by mouth daily with breakfast. Patient taking differently: Take 75 mg by mouth daily after breakfast.  07/10/18  Yes Crenshaw, Denice Bors, MD  docusate sodium (COLACE) 100 MG capsule Take 100 mg by mouth daily after breakfast.    Yes [provider]  ferrous sulfate 325 (65 FE) MG tablet Take 325 mg by mouth daily after supper.    Yes [provider]  finasteride (PROSCAR) 5 MG tablet Take 5 mg by mouth daily after breakfast.    Yes [provider]  levothyroxine (SYNTHROID, LEVOTHROID) 75 MCG tablet Take 1 tablet (75 mcg total) by mouth daily at 6 (six) AM. Patient taking differently: Take 75 mcg by mouth daily before breakfast.  07/28/18  Yes Fuller Plan A, MD  lovastatin (MEVACOR) 20 MG tablet Take 1 tablet (20 mg total) by mouth daily at 6 PM. Patient taking  differently: Take 20 mg by mouth daily after supper.  03/03/18  Yes Lelon Perla, MD  meclizine (ANTIVERT) 25 MG tablet Take 1 tablet (25 mg total) by mouth 3 (three) times daily as needed for dizziness. 08/05/16  Yes Fredia Sorrow, MD  Multiple Vitamin (MULTIVITAMIN WITH MINERALS) TABS tablet Take 1 tablet by mouth daily after breakfast.    Yes [provider]  nitroGLYCERIN (NITROSTAT) 0.4 MG SL tablet Place 1 tablet (0.4 mg total) under the tongue every 5 (five) minutes as needed for chest pain. 06/14/18  Yes Lelon Perla, MD  Probiotic Product (PROBIOTIC DAILY PO) Take 1 capsule by mouth daily after breakfast.    Yes [provider]    Physical Exam: Vitals:   08/25/18 1830 08/25/18 1845 08/25/18 1900 08/26/18 0016  BP: (!) 164/82 (!) 153/88 (!) 163/83 (!) 159/82  Pulse: 65 62 63 (!) 59  Resp: 16 17 13 18   Temp:      TempSrc:      SpO2: 98% 97% 98% 99%  Weight:      Height:        Constitutional: NAD, calm  Eyes: PERTLA, lids and conjunctivae normal ENMT: Mucous membranes are moist. Posterior pharynx clear of any exudate or lesions.   Neck: normal, supple, no masses, no thyromegaly Respiratory: clear to auscultation bilaterally, no wheezing, no crackles. Normal respiratory effort.    Cardiovascular: S1 & S2 heard, regular rate and rhythm. No extremity edema.   Abdomen: No distension, no tenderness, soft. Bowel sounds normal.  Musculoskeletal: no clubbing / cyanosis. No joint deformity upper and lower extremities.    Skin: no significant rashes, lesions, ulcers. Warm, dry, well-perfused. Neurologic: CN 2-12 grossly intact. Sensation intact. Strength 5/5 in all 4 limbs.  Psychiatric: Alert and oriented to person, place, and situation. Very pleasant and cooperative.     Labs on Admission: I have personally reviewed following labs and imaging studies  CBC: Recent Labs  Lab 08/25/18 1645  WBC 7.2  NEUTROABS 5.0  HGB 13.6  HCT 43.7  MCV 90.5  PLT  557   Basic Metabolic Panel: Recent Labs  Lab 08/25/18 1645  NA 136  K 4.0  CL 102  CO2 28  GLUCOSE 100*  BUN 14  CREATININE 1.07  CALCIUM 9.0   GFR: Estimated  Creatinine Clearance: 41.5 mL/min (by C-G formula based on SCr of 1.07 mg/dL). Liver Function Tests: Recent Labs  Lab 08/25/18 1645  AST 24  ALT 22  ALKPHOS 58  BILITOT 0.7  PROT 6.9  ALBUMIN 3.5   No results for input(s): LIPASE, AMYLASE in the last 168 hours. No results for input(s): AMMONIA in the last 168 hours. Coagulation Profile: No results for input(s): INR, PROTIME in the last 168 hours. Cardiac Enzymes: No results for input(s): CKTOTAL, CKMB, CKMBINDEX, TROPONINI in the last 168 hours. BNP (last 3 results) No results for input(s): PROBNP in the last 8760 hours. HbA1C: No results for input(s): HGBA1C in the last 72 hours. CBG: No results for input(s): GLUCAP in the last 168 hours. Lipid Profile: No results for input(s): CHOL, HDL, LDLCALC, TRIG, CHOLHDL, LDLDIRECT in the last 72 hours. Thyroid Function Tests: No results for input(s): TSH, T4TOTAL, FREET4, T3FREE, THYROIDAB in the last 72 hours. Anemia Panel: No results for input(s): VITAMINB12, FOLATE, FERRITIN, TIBC, IRON, RETICCTPCT in the last 72 hours. Urine analysis:    Component Value Date/Time   COLORURINE YELLOW 07/25/2018 2332   APPEARANCEUR CLOUDY (A) 07/25/2018 2332   LABSPEC 1.015 07/25/2018 2332   PHURINE 6.0 07/25/2018 2332   GLUCOSEU NEGATIVE 07/25/2018 2332   GLUCOSEU NEG mg/dL 02/16/2010 0000   HGBUR TRACE (A) 07/25/2018 2332   BILIRUBINUR NEGATIVE 07/25/2018 2332   BILIRUBINUR negative 07/24/2018 1502   KETONESUR NEGATIVE 07/25/2018 2332   PROTEINUR NEGATIVE 07/25/2018 2332   UROBILINOGEN negative (A) 07/24/2018 1502   UROBILINOGEN 0.2 09/13/2014 1634   NITRITE POSITIVE (A) 07/25/2018 2332   LEUKOCYTESUR LARGE (A) 07/25/2018 2332   Sepsis Labs: @LABRCNTIP (procalcitonin:4,lacticidven:4) )No results found for this or  any previous visit (from the past 240 hour(s)).   Radiological Exams on Admission: Ct Angio Head W Or Wo Contrast  Result Date: 08/25/2018 CLINICAL DATA:  Ataxia with suspected stroke EXAM: CT ANGIOGRAPHY HEAD AND NECK TECHNIQUE: Multidetector CT imaging of the head and neck was performed using the standard protocol during bolus administration of intravenous contrast. Multiplanar CT image reconstructions and MIPs were obtained to evaluate the vascular anatomy. Carotid stenosis measurements (when applicable) are obtained utilizing NASCET criteria, using the distal internal carotid diameter as the denominator. CONTRAST:  188mL ISOVUE-370 IOPAMIDOL (ISOVUE-370) INJECTION 76% COMPARISON:  Head CT 08/13/2018 Brain MRI 08/13/2018 FINDINGS: CT HEAD FINDINGS Brain: There is no mass, hemorrhage or extra-axial collection. The size and configuration of the ventricles and extra-axial CSF spaces are normal. There is no acute or chronic infarction. The brain parenchyma is normal. Skull: The visualized skull base, calvarium and extracranial soft tissues are normal. Sinuses/Orbits: No fluid levels or advanced mucosal thickening of the visualized paranasal sinuses. No mastoid or middle ear effusion. The orbits are normal. CTA NECK FINDINGS SKELETON: There is no bony spinal canal stenosis. No lytic or blastic lesion. OTHER NECK: Normal pharynx, larynx and major salivary glands. No cervical lymphadenopathy. Unremarkable thyroid gland. UPPER CHEST: Numerous calcified mediastinal lymph nodes. AORTIC ARCH: There is mild calcific atherosclerosis of the aortic arch. There is no aneurysm, dissection or hemodynamically significant stenosis of the visualized ascending aorta and aortic arch. Conventional 3 vessel aortic branching pattern. The visualized proximal subclavian arteries are widely patent. RIGHT CAROTID SYSTEM: --Common carotid artery: Widely patent origin without common carotid artery dissection or aneurysm. --Internal carotid  artery: Normal without aneurysm, dissection or stenosis. --External carotid artery: No acute abnormality. LEFT CAROTID SYSTEM: --Common carotid artery: Widely patent origin without common carotid artery dissection  or aneurysm. --Internal carotid artery: Normal without aneurysm, dissection or stenosis. --External carotid artery: No acute abnormality. VERTEBRAL ARTERIES: Right dominant configuration. Both origins are normal. No dissection, occlusion or flow-limiting stenosis to the vertebrobasilar confluence. CTA HEAD FINDINGS POSTERIOR CIRCULATION: --Basilar artery: Normal. --Posterior cerebral arteries: Normal. Both originate from the basilar artery. --Superior cerebellar arteries: Normal. --Inferior cerebellar arteries: Normal anterior and posterior inferior cerebellar arteries. ANTERIOR CIRCULATION: --Intracranial internal carotid arteries: Atherosclerotic calcification of the internal carotid arteries at the skull base without hemodynamically significant stenosis. --Anterior cerebral arteries: Normal. Both A1 segments are present. Patent anterior communicating artery. --Middle cerebral arteries: Normal. --Posterior communicating arteries: Absent bilaterally. VENOUS SINUSES: As permitted by contrast timing, patent. ANATOMIC VARIANTS: None DELAYED PHASE: No parenchymal contrast enhancement. Review of the MIP images confirms the above findings. IMPRESSION: 1. No emergent large vessel occlusion or hemodynamically significant stenosis of the arteries of the head and neck. 2. Numerous calcified mediastinal lymph nodes, possibly a sequela of prior granulomatous infection. 3.  Aortic atherosclerosis (ICD10-I70.0). Electronically Signed   By: Ulyses Jarred M.D.   On: 08/25/2018 19:52   Ct Angio Neck W And/or Wo Contrast  Result Date: 08/25/2018 CLINICAL DATA:  Ataxia with suspected stroke EXAM: CT ANGIOGRAPHY HEAD AND NECK TECHNIQUE: Multidetector CT imaging of the head and neck was performed using the standard  protocol during bolus administration of intravenous contrast. Multiplanar CT image reconstructions and MIPs were obtained to evaluate the vascular anatomy. Carotid stenosis measurements (when applicable) are obtained utilizing NASCET criteria, using the distal internal carotid diameter as the denominator. CONTRAST:  182mL ISOVUE-370 IOPAMIDOL (ISOVUE-370) INJECTION 76% COMPARISON:  Head CT 08/13/2018 Brain MRI 08/13/2018 FINDINGS: CT HEAD FINDINGS Brain: There is no mass, hemorrhage or extra-axial collection. The size and configuration of the ventricles and extra-axial CSF spaces are normal. There is no acute or chronic infarction. The brain parenchyma is normal. Skull: The visualized skull base, calvarium and extracranial soft tissues are normal. Sinuses/Orbits: No fluid levels or advanced mucosal thickening of the visualized paranasal sinuses. No mastoid or middle ear effusion. The orbits are normal. CTA NECK FINDINGS SKELETON: There is no bony spinal canal stenosis. No lytic or blastic lesion. OTHER NECK: Normal pharynx, larynx and major salivary glands. No cervical lymphadenopathy. Unremarkable thyroid gland. UPPER CHEST: Numerous calcified mediastinal lymph nodes. AORTIC ARCH: There is mild calcific atherosclerosis of the aortic arch. There is no aneurysm, dissection or hemodynamically significant stenosis of the visualized ascending aorta and aortic arch. Conventional 3 vessel aortic branching pattern. The visualized proximal subclavian arteries are widely patent. RIGHT CAROTID SYSTEM: --Common carotid artery: Widely patent origin without common carotid artery dissection or aneurysm. --Internal carotid artery: Normal without aneurysm, dissection or stenosis. --External carotid artery: No acute abnormality. LEFT CAROTID SYSTEM: --Common carotid artery: Widely patent origin without common carotid artery dissection or aneurysm. --Internal carotid artery: Normal without aneurysm, dissection or stenosis. --External  carotid artery: No acute abnormality. VERTEBRAL ARTERIES: Right dominant configuration. Both origins are normal. No dissection, occlusion or flow-limiting stenosis to the vertebrobasilar confluence. CTA HEAD FINDINGS POSTERIOR CIRCULATION: --Basilar artery: Normal. --Posterior cerebral arteries: Normal. Both originate from the basilar artery. --Superior cerebellar arteries: Normal. --Inferior cerebellar arteries: Normal anterior and posterior inferior cerebellar arteries. ANTERIOR CIRCULATION: --Intracranial internal carotid arteries: Atherosclerotic calcification of the internal carotid arteries at the skull base without hemodynamically significant stenosis. --Anterior cerebral arteries: Normal. Both A1 segments are present. Patent anterior communicating artery. --Middle cerebral arteries: Normal. --Posterior communicating arteries: Absent bilaterally. VENOUS SINUSES: As permitted by contrast  timing, patent. ANATOMIC VARIANTS: None DELAYED PHASE: No parenchymal contrast enhancement. Review of the MIP images confirms the above findings. IMPRESSION: 1. No emergent large vessel occlusion or hemodynamically significant stenosis of the arteries of the head and neck. 2. Numerous calcified mediastinal lymph nodes, possibly a sequela of prior granulomatous infection. 3.  Aortic atherosclerosis (ICD10-I70.0). Electronically Signed   By: Ulyses Jarred M.D.   On: 08/25/2018 19:52    EKG: Independently reviewed. Sinus rhythm, IVCD, LVH with repolarization abnormality.   Assessment/Plan   1. Syncope  - Presents after a syncopal episode without prodrome  - He had been walking when he had a syncopal episode without any preceding lightheadedness or warning, no associated chest pain or palpitations, no s/s to suggest PE  - He had worn ambulatory heart monitor last year without significant arrhythmia  - EKG is abnormal but not significantly different than prior  - Check orthostatic vitals, continue cardiac monitoring,  check echocardiogram    ADDENDUM: Patient had 30 mmHg drop in SBP on standing. Will gently hydrate and repeat orthostatics in am. In light of this, he probably does not need a repeat echo now; he had echo in December and while admission EKG is abnormal, it is essentially unchanged.   2. CAD  - No anginal complaints  - Continue statin and Plavix   3. Chronic combined systolic and diastolic CHF  - Appears compensated  - SLIV, follow daily wt    4. PAF  - In sinus rhythm on admission  - CHADS-VASc is 48 (age x2, CHF, CAD, HTN)  - He is followed by cardiology, taken off of Eliquis a few months ago for dizziness with recurrent falls  - Continue amiodarone, Plavix    5. Recurrent UTI's  - Patient has hx of recurrent UTI, is currently on cefadroxil and believes it is for UTI though he denies any current sxs and is not sure; he recommends asking his wife in am  - No fever or leukocytosis on admission  - Will continue for now    6. Hypertension  - BP at goal, continue Norvasc    7. Hypothyroidism  - Continue Synthroid    DVT prophylaxis: Lovenox   Code Status: DNR  Family Communication: Discussed with patient  Consults called: None Admission status: Observation     Vianne Bulls, MD [facility] Pager [phone]  If 7PM-7AM, please contact night-coverage [email or web address] Password [facility]  08/26/2018, 12:17 AM

## 2018-08-26 NOTE — ED Notes (Signed)
Report called to rn on 3e 

## 2018-08-26 NOTE — ED Notes (Signed)
Unsuccessful attempt to call report to [phone]

## 2018-08-26 NOTE — Consult Note (Signed)
Cardiology Consultation:   Patient ID: Justin [name] MRN: [id number]; DOB: 03/01/1929  Admit date: 08/25/2018 Date of Consult: 08/26/2018  Primary Care Provider: Mosie Lukes, MD Primary Cardiologist: Dr Stanford Breed   Patient Profile:   Justin [name] is a 85 y.o. male with a hx of paroxysmal atrial fibrillation, coronary artery disease status post coronary artery bypass and graft, moderate to severe aortic stenosis, hypertension, hyperlipidemia who is being seen today for the evaluation of syncope at the request of Mitzi Hansen MD.  History of Present Illness:   Pt is s/p CABG (2001) and paroxysmal atrial fibrillation (on amiodarone). Had NSTEMI 12/18; cath showed occluded native vessels, 95 SVG to OM1 (PCI with DES), occluded SVG to OM2 and 3, patent SVG to RCA and patent LIMA to LAD. Metoprolol and cardizem DCed duringpreviousov due to bradycardia.Seen with syncope 7/19; concern for post termination pauses.Monitor7/19 showedno significant arrhythmia.Echo 12/19 showed EF 40-45, moderate LVH, grade 1 DD, moderate to severe stenosis with mean gradient 30 mmHg, AVA 0.5 cm2, mild MR, mild to moderate LAE, mild RV dysfunction.   Patient is confused this morning at time of evaluation.  However he denies dyspnea on exertion, orthopnea, PND, pedal edema, chest pain.  He has little memory of yesterday's events but apparently he was ambulating with his walker and had syncope.  No preceding dyspnea, nausea, chest pain or palpitations.  He states he was unconscious for less than 45 seconds.  He did strike his head.  He now feels well.  Cardiology asked to evaluate.  He was noted to be orthostatic during this admission.  Past Medical History:  Diagnosis Date  . Basal cell carcinoma   . BPH (benign prostatic hypertrophy)   . CAD (coronary artery disease) 1991   a. H/o MI w/ PTCA;  b. s/p CABG by Dr Roxan Hockey w/ LIMA-LAD, SVG-OM1-OM3, SVG-D1, SVG-PDA  . CHF (congestive heart failure) ([place])   .  Complication of anesthesia    Wife reports history of confusion after last surgery in 10/2016-lasted a few days  . HTN (hypertension)   . Hyperlipidemia   . Internal hemorrhoids   . Moderate aortic stenosis    a. 08/2014 Echo: EF 55-60%, no rwma, Gr 1 DD, mod AS, mildly dil asc Ao, mild MR, sev dil LA.  . NSTEMI (non-ST elevated myocardial infarction) ([place]) 05/2017    PCI with DES to SVG-OM1. SVG-OM3 occluded, LIMA-LAD and SVG-RCA patent  . PAF (paroxysmal atrial fibrillation) ([place])    a. Dx 08/2014;  b. CHA2DS2VASc = 4 - decision made to withold [place] 2/2 unsteady gait and syncope.  . Sigmoid diverticulitis   . Syncope    a. 08/2014 - ? orthostatic    Past Surgical History:  Procedure Laterality Date  . CARDIAC CATHETERIZATION    . COLONOSCOPY    . CORONARY ARTERY BYPASS GRAFT  2000   Dr Roxan Hockey  LIMA-LAD, SVG-OM1-OM3, SVG-D1, SVG-PDA.   [name] CORONARY STENT INTERVENTION N/A 06/13/2017   Procedure: CORONARY STENT INTERVENTION;  Surgeon: Lorretta Harp, MD;  Location: [facility];  Service: Cardiovascular;  Laterality: N/A;  . CYSTOSCOPY N/A 12/07/2016   Procedure: CYSTOSCOPY;  Surgeon: Cleon Gustin, MD;  Location: [facility];  Service: Urology;  Laterality: N/A;  NEEDS 30 MIN TOTAL FOR SURGERY  . CYSTOSCOPY WITH URETHRAL DILATATION N/A 02/21/2017   Procedure: DILATATION OF BLADDER NECK WITH INJECTION OF MITOMYCIN C;  Surgeon: Cleon Gustin, MD;  Location: [facility];  Service: Urology;  Laterality: N/A;  .  EYE SURGERY     bil cataract  . HIP ARTHROPLASTY Left 10/14/2015   Procedure: LEFT HIP HEMI ARTHROPLASTY;  Surgeon: [name], MD;  Location: [facility];  Service: Orthopedics;  Laterality: Left;  . LEFT HEART CATH AND CORS/GRAFTS ANGIOGRAPHY N/A 06/13/2017   Procedure: LEFT HEART CATH AND CORS/GRAFTS ANGIOGRAPHY;  Surgeon: Lorretta Harp, MD;  Location: [facility];  Service: Cardiovascular;  Laterality: N/A;  . SHOULDER SURGERY     x2  . TONSILLECTOMY    .  TRANSTHORACIC ECHOCARDIOGRAM  10-06-2016   dr Stanford Breed   mild LVH, ef 40-45%, diffuse hypokinesis/ moderate AV calcification leaflets and annulus,  severe thickened leaflets, mod.-sev. aortic stenosis (Valva area 0.61cm^2, mean grandient 49mmHg, peak gradiant 82mmHg)/  moderate thickened , mild calcified MV leaflets without stenosis, moderate MR/  severe LAE/ trivial PR and TR  . TRANSURETHRAL RESECTION OF BLADDER NECK N/A 12/07/2016   Procedure: TRANSURETHRAL RESECTION OF BLADDER NECK;  Surgeon: Cleon Gustin, MD;  Location: [facility];  Service: Urology;  Laterality: N/A;  . XI ROBOTIC ASSISTED SIMPLE PROSTATECTOMY N/A 11/04/2016   Procedure: XI ROBOTIC ASSISTED SIMPLE PROSTATECTOMY;  Surgeon: Cleon Gustin, MD;  Location: [facility];  Service: Urology;  Laterality: N/A;     Inpatient Medications: Scheduled Meds: . amiodarone  200 mg Oral QPC supper  . amLODipine  2.5 mg Oral Daily  . cephALEXin  500 mg Oral Q12H  . clopidogrel  75 mg Oral Daily  . docusate sodium  100 mg Oral QPC breakfast  . enoxaparin (LOVENOX) injection  40 mg Subcutaneous Daily  . finasteride  5 mg Oral Daily  . levothyroxine  75 mcg Oral QAC breakfast  . pravastatin  20 mg Oral q1800  . sodium chloride flush  3 mL Intravenous Q12H  . sodium chloride flush  3 mL Intravenous Q12H   Continuous Infusions: . sodium chloride     PRN Meds: sodium chloride, acetaminophen **OR** acetaminophen, meclizine, ondansetron **OR** ondansetron (ZOFRAN) IV, polyethylene glycol, sodium chloride flush  Allergies:    Allergies  Allergen Reactions  . Morphine And Related Other (See Comments)    Severe confusion  . Diltiazem Hcl Other (See Comments)    bradycardia  . Metoprolol Other (See Comments)    bradycardia  . Toradol [Ketorolac Tromethamine] Other (See Comments)    Dizzy and light headed  . Hydrocodone Other (See Comments)    hallucinations  . Sulfa Antibiotics Other (See Comments)    Caused extreme weakness/low  sodium  Feb. 2020 per wife  . Dutasteride Diarrhea    GI upset and incontinence    Social History:   Social History   Socioeconomic History  . Marital status: Married    Spouse name: Not on file  . Number of children: 2  . Years of education: Not on file  . Highest education level: Not on file  Occupational History  . 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 Financial resource strain: Not very hard  . Food insecurity:    Worry: Never true    Inability: Never true  . Transportation needs:    Medical: No    Non-medical: No  Tobacco Use  . Smoking status: Former Smoker    Packs/day: 0.50    Years: 15.00    Pack years: 7.50    Last attempt to quit: 06/29/1971    Years since quitting: 47.1  . Smokeless tobacco: Never Used  Substance and Sexual Activity  . Alcohol use: Yes  Alcohol/week: 1.0 - 2.0 standard drinks    Types: 1 - 2 Glasses of wine per week    Comment: alternates wine and liquor. 1/2 pint liquor/week  . Drug use: No  . 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 Physical activity:    Days per week: 0 days    Minutes per session: 0 min  . Stress: Not at all  Relationships  . Social connections:    Talks on phone: Not on file    Gets together: Not on file    Attends religious service: Not on file    Active member of club or organization: Not on file    Attends meetings of clubs or organizations: Not on file    Relationship status: Not on file  . Intimate partner violence:    Fear of current or ex partner: Not on file    Emotionally abused: Not on file    Physically abused: Not on file    Forced sexual activity: Not on file  Other Topics Concern  . Not on file  Social History Narrative   Pt and wife have an Independent home at [facility], wants to move to an apartment soon.    Family History:    Family History  Problem Relation Age of  Onset  . Coronary artery disease Father   . Hyperlipidemia Father   . Heart attack Father   . Alcohol abuse Father   . Bipolar disorder Mother   . Heart disease Mother   . Alcohol abuse Brother   . Cancer Brother   . Cancer Maternal Grandfather        oral cancer, chew tobacco  . Cancer Sister   . Glaucoma Sister   . Alcohol abuse Other      ROS:  Please see the history of present illness.  No headaches, fevers, chills, productive cough or hemoptysis.  He does have unsteady balance. All other ROS reviewed and negative.     Physical Exam/Data:   Vitals:   08/26/18 0048 08/26/18 0049 08/26/18 0057 08/26/18 0647  BP: (!) 167/87 (!) 142/89  129/87  Pulse: 66 72  63  Resp:    16  Temp:    98.1 F (36.7 C)  TempSrc:    Oral  SpO2:    96%  Weight:   63.9 kg   Height:   5\' 5"  (1.651 m)     Intake/Output Summary (Last 24 hours) at 08/26/2018 1134 Last data filed at 08/26/2018 0900 Gross per 24 hour  Intake 483 ml  Output 760 ml  Net -277 ml   Last 3 Weights 08/26/2018 08/25/2018 08/13/2018  Weight (lbs) 140 lb 12.8 oz 145 lb 145 lb  Weight (kg) 63.866 kg 65.772 kg 65.772 kg     Body mass index is 23.43 kg/m.  General:  Frail, well developed, in no acute distress HEENT: normal Lymph: no adenopathy Neck: no JVD Endocrine:  No thryomegaly Vascular: FA pulses 2+ bilaterally without bruits  Cardiac:  normal S1, S2; RRR; 3/6 systolic murmur left sternal border.  S2 is preserved. Lungs:  clear to auscultation bilaterally, no wheezing, rhonchi or rales  Abd: soft, nontender, no hepatomegaly  Ext: no edema Musculoskeletal:  No deformities, BUE and BLE strength normal and equal Skin: warm and dry  Neuro:  CNs 2-12 intact, no focal abnormalities noted Psych:  Normal affect   EKG:  The EKG was personally reviewed  and demonstrates: Sinus rhythm, first-degree AV block, left anterior fascicular block, right bundle branch block, left ventricular hypertrophy, nonspecific ST  changes. Telemetry:  Telemetry was personally reviewed and demonstrates: Sinus rhythm.  Laboratory Data:  Chemistry Recent Labs  Lab 08/25/18 1645 08/26/18 0518  NA 136 135  K 4.0 3.7  CL 102 105  CO2 28 23  GLUCOSE 100* 95  BUN 14 10  CREATININE 1.07 1.01  CALCIUM 9.0 8.7*  GFRNONAA >60 >60  GFRAA >60 >60  ANIONGAP 6 7    Recent Labs  Lab 08/25/18 1645  PROT 6.9  ALBUMIN 3.5  AST 24  ALT 22  ALKPHOS 58  BILITOT 0.7   Hematology Recent Labs  Lab 08/25/18 1645 08/26/18 0518  WBC 7.2 8.4  RBC 4.83 4.43  HGB 13.6 13.1  HCT 43.7 38.3*  MCV 90.5 86.5  MCH 28.2 29.6  MCHC 31.1 34.2  RDW 14.9 14.9  PLT 346 333    Recent Labs  Lab 08/25/18 1649  TROPIPOC 0.01   Radiology/Studies:  Ct Angio Head W Or Wo Contrast  Result Date: 08/25/2018 CLINICAL DATA:  Ataxia with suspected stroke EXAM: CT ANGIOGRAPHY HEAD AND NECK TECHNIQUE: Multidetector CT imaging of the head and neck was performed using the standard protocol during bolus administration of intravenous contrast. Multiplanar CT image reconstructions and MIPs were obtained to evaluate the vascular anatomy. Carotid stenosis measurements (when applicable) are obtained utilizing NASCET criteria, using the distal internal carotid diameter as the denominator. CONTRAST:  172mL ISOVUE-370 IOPAMIDOL (ISOVUE-370) INJECTION 76% COMPARISON:  Head CT 08/13/2018 Brain MRI 08/13/2018 FINDINGS: CT HEAD FINDINGS Brain: There is no mass, hemorrhage or extra-axial collection. The size and configuration of the ventricles and extra-axial CSF spaces are normal. There is no acute or chronic infarction. The brain parenchyma is normal. Skull: The visualized skull base, calvarium and extracranial soft tissues are normal. Sinuses/Orbits: No fluid levels or advanced mucosal thickening of the visualized paranasal sinuses. No mastoid or middle ear effusion. The orbits are normal. CTA NECK FINDINGS SKELETON: There is no bony spinal canal stenosis.  No lytic or blastic lesion. OTHER NECK: Normal pharynx, larynx and major salivary glands. No cervical lymphadenopathy. Unremarkable thyroid gland. UPPER CHEST: Numerous calcified mediastinal lymph nodes. AORTIC ARCH: There is mild calcific atherosclerosis of the aortic arch. There is no aneurysm, dissection or hemodynamically significant stenosis of the visualized ascending aorta and aortic arch. Conventional 3 vessel aortic branching pattern. The visualized proximal subclavian arteries are widely patent. RIGHT CAROTID SYSTEM: --Common carotid artery: Widely patent origin without common carotid artery dissection or aneurysm. --Internal carotid artery: Normal without aneurysm, dissection or stenosis. --External carotid artery: No acute abnormality. LEFT CAROTID SYSTEM: --Common carotid artery: Widely patent origin without common carotid artery dissection or aneurysm. --Internal carotid artery: Normal without aneurysm, dissection or stenosis. --External carotid artery: No acute abnormality. VERTEBRAL ARTERIES: Right dominant configuration. Both origins are normal. No dissection, occlusion or flow-limiting stenosis to the vertebrobasilar confluence. CTA HEAD FINDINGS POSTERIOR CIRCULATION: --Basilar artery: Normal. --Posterior cerebral arteries: Normal. Both originate from the basilar artery. --Superior cerebellar arteries: Normal. --Inferior cerebellar arteries: Normal anterior and posterior inferior cerebellar arteries. ANTERIOR CIRCULATION: --Intracranial internal carotid arteries: Atherosclerotic calcification of the internal carotid arteries at the skull base without hemodynamically significant stenosis. --Anterior cerebral arteries: Normal. Both A1 segments are present. Patent anterior communicating artery. --Middle cerebral arteries: Normal. --Posterior communicating arteries: Absent bilaterally. VENOUS SINUSES: As permitted by contrast timing, patent. ANATOMIC VARIANTS: None DELAYED PHASE: No parenchymal  contrast enhancement.  Review of the MIP images confirms the above findings. IMPRESSION: 1. No emergent large vessel occlusion or hemodynamically significant stenosis of the arteries of the head and neck. 2. Numerous calcified mediastinal lymph nodes, possibly a sequela of prior granulomatous infection. 3.  Aortic atherosclerosis (ICD10-I70.0). Electronically Signed   By: Ulyses Jarred M.D.   On: 08/25/2018 19:52   Ct Angio Neck W And/or Wo Contrast  Result Date: 08/25/2018 CLINICAL DATA:  Ataxia with suspected stroke EXAM: CT ANGIOGRAPHY HEAD AND NECK TECHNIQUE: Multidetector CT imaging of the head and neck was performed using the standard protocol during bolus administration of intravenous contrast. Multiplanar CT image reconstructions and MIPs were obtained to evaluate the vascular anatomy. Carotid stenosis measurements (when applicable) are obtained utilizing NASCET criteria, using the distal internal carotid diameter as the denominator. CONTRAST:  110mL ISOVUE-370 IOPAMIDOL (ISOVUE-370) INJECTION 76% COMPARISON:  Head CT 08/13/2018 Brain MRI 08/13/2018 FINDINGS: CT HEAD FINDINGS Brain: There is no mass, hemorrhage or extra-axial collection. The size and configuration of the ventricles and extra-axial CSF spaces are normal. There is no acute or chronic infarction. The brain parenchyma is normal. Skull: The visualized skull base, calvarium and extracranial soft tissues are normal. Sinuses/Orbits: No fluid levels or advanced mucosal thickening of the visualized paranasal sinuses. No mastoid or middle ear effusion. The orbits are normal. CTA NECK FINDINGS SKELETON: There is no bony spinal canal stenosis. No lytic or blastic lesion. OTHER NECK: Normal pharynx, larynx and major salivary glands. No cervical lymphadenopathy. Unremarkable thyroid gland. UPPER CHEST: Numerous calcified mediastinal lymph nodes. AORTIC ARCH: There is mild calcific atherosclerosis of the aortic arch. There is no aneurysm, dissection or  hemodynamically significant stenosis of the visualized ascending aorta and aortic arch. Conventional 3 vessel aortic branching pattern. The visualized proximal subclavian arteries are widely patent. RIGHT CAROTID SYSTEM: --Common carotid artery: Widely patent origin without common carotid artery dissection or aneurysm. --Internal carotid artery: Normal without aneurysm, dissection or stenosis. --External carotid artery: No acute abnormality. LEFT CAROTID SYSTEM: --Common carotid artery: Widely patent origin without common carotid artery dissection or aneurysm. --Internal carotid artery: Normal without aneurysm, dissection or stenosis. --External carotid artery: No acute abnormality. VERTEBRAL ARTERIES: Right dominant configuration. Both origins are normal. No dissection, occlusion or flow-limiting stenosis to the vertebrobasilar confluence. CTA HEAD FINDINGS POSTERIOR CIRCULATION: --Basilar artery: Normal. --Posterior cerebral arteries: Normal. Both originate from the basilar artery. --Superior cerebellar arteries: Normal. --Inferior cerebellar arteries: Normal anterior and posterior inferior cerebellar arteries. ANTERIOR CIRCULATION: --Intracranial internal carotid arteries: Atherosclerotic calcification of the internal carotid arteries at the skull base without hemodynamically significant stenosis. --Anterior cerebral arteries: Normal. Both A1 segments are present. Patent anterior communicating artery. --Middle cerebral arteries: Normal. --Posterior communicating arteries: Absent bilaterally. VENOUS SINUSES: As permitted by contrast timing, patent. ANATOMIC VARIANTS: None DELAYED PHASE: No parenchymal contrast enhancement. Review of the MIP images confirms the above findings. IMPRESSION: 1. No emergent large vessel occlusion or hemodynamically significant stenosis of the arteries of the head and neck. 2. Numerous calcified mediastinal lymph nodes, possibly a sequela of prior granulomatous infection. 3.  Aortic  atherosclerosis (ICD10-I70.0). Electronically Signed   By: Ulyses Jarred M.D.   On: 08/25/2018 19:52    Assessment and Plan:   1. Syncope-etiology unclear. Difficult historian as he is somewhat confused this morning and does not have good recollection of events yesterday. Patient was orthostatic which could have contributed.  No significant arrhythmias noted on telemetry.  Aortic stenosis could have contributed but not clear.  I discussed aggressiveness of  care with patient and his wife.  He has difficulty with balance and is frail.  He is a no CODE BLUE.  She would like to discuss this further with him at home (he becomes confused in the hospital).  However she states he would likely not want additional aggressive cardiac therapies including TAVR.  I will see him back in the office and we can discuss this further.  She understands that his aortic stenosis may worsen and that he could have additional syncopal episodes in the future.  However she states "this is no life at our age". 2. Coronary artery disease status post coronary artery bypass and graft-no chest pain.  Continue medical therapy with Plavix and statin. 3. Paroxysmal atrial fibrillation-continue amiodarone at present dose.  Patient remains in sinus rhythm. CHADSvasc 5.  However I do not think he is a good candidate for anticoagulation given history of falls and instability. 4. Hypertension-continue preadmission blood pressure medications. 5. Hyperlipidemia-continue statin. 6. No CODE BLUE status  Patient can be discharged from a cardiac standpoint.  He should follow-up with me in the office in 2 to 4 weeks.  Continue preadmission medications.  No further cardiac testing anticipated.  Please call with questions.  Cardiology will sign off.   For questions or updates, please contact [place] Please consult [email or web address] for contact info under     Signed, Kirk Ruths, MD  08/26/2018 11:34 AM

## 2018-08-26 NOTE — Plan of Care (Signed)
  Problem: Education: Goal: Ability to verbalize understanding of medication therapies will improve Outcome: Progressing   Problem: Education: Goal: Ability to demonstrate management of disease process will improve Outcome: Progressing   Problem: Activity: Goal: Capacity to carry out activities will improve Outcome: Progressing   Problem: Cardiac: Goal: Ability to achieve and maintain adequate cardiopulmonary perfusion will improve Outcome: Progressing   Problem: Education: Goal: Knowledge of General Education information will improve Description Including pain rating scale, medication(s)/side effects and non-pharmacologic comfort measures Outcome: Progressing

## 2018-08-26 NOTE — Discharge Summary (Signed)
Physician Discharge Summary  Justin [name] BJS:[id number] DOB: Dec 03, 1928 DOA: 08/25/2018  PCP: Mosie Lukes, MD  Admit date: 08/25/2018 Discharge date: 08/26/2018  Admitted From: Home Disposition:  Home  Recommendations for Outpatient Follow-up:  1. Follow up with PCP in 1 week 2. Follow up with Dr. [facility] in 2-4 weeks   Discharge Condition: Stable CODE STATUS: DNR  Diet recommendation: Heart healthy   Brief/Interim Summary: From H&P by Dr. Myna Hidalgo: "HPI: Justin [name] is a 85 y.o. male with medical history significant for CAD, chronic combined systolic and diastolic CHF, hypertension, vertigo, hypothyroidism, recurrent UTI, and some memory loss, now presenting to the emergency department after syncopal episode.  The patient reports that he was in his usual state, having an uneventful day, and was walking across a carpeted floor when he had a sudden episode of syncope.  Patient denies any lightheadedness, denies any chest pain or palpitations, has not been short of breath or coughing, denies leg swelling or tenderness, and denies any melena or hematochezia.  Reports that he had no warning or preceding symptoms before the episode.  He seemed to return to his usual state rapidly and was able to call for help.  He denies any headache or any other pain or injury related to this.  ED Course: Upon arrival to the ED, patient is found to be afebrile, saturating well on room air, and with vitals otherwise stable.  EKG features a sinus rhythm with IVCD, LVH with repolarization abnormality, similar to prior.  CTA head and neck was obtained in the ED and negative for emergent LVO or hemodynamically significant stenosis.  Chemistry panel and CBC are unremarkable and troponin is normal.  Patient remained hemodynamically stable in the ED with no significant arrhythmia on cardiac monitoring.  He will be observed for further evaluation and management."  Interim: Patient was noted to have positive  orthostatic BP on admission 165/89 --> 136/90. Cardiology was consulted due to his history of moderate to severe aortic stenosis. His syncope could have been due to orthostasis as well as due to aortic stenosis. Orthostatic VS improved on morning of discharge. He will follow up with PCP and cardiology on discharge.   Discharge Diagnoses:  Principal Problem:   Syncope Active Problems:   Hyperlipidemia   Essential hypertension   CAD S/P percutaneous coronary angioplasty   BPH (benign prostatic hyperplasia)   Aortic stenosis   Paroxysmal atrial fibrillation (HCC)   Hypothyroidism   Chronic combined systolic and diastolic CHF (congestive heart failure) [facility])   Discharge Instructions  Discharge Instructions    (HEART FAILURE PATIENTS) Call MD:  Anytime you have any of the following symptoms: 1) 3 pound weight gain in 24 hours or 5 pounds in 1 week 2) shortness of breath, with or without a dry hacking cough 3) swelling in the hands, feet or stomach 4) if you have to sleep on extra pillows at night in order to breathe.   Complete by:  As directed    Call MD for:  difficulty breathing, headache or visual disturbances   Complete by:  As directed    Call MD for:  extreme fatigue   Complete by:  As directed    Call MD for:  persistant dizziness or light-headedness   Complete by:  As directed    Call MD for:  persistant nausea and vomiting   Complete by:  As directed    Call MD for:  severe uncontrolled pain   Complete by:  As  directed    Diet - low sodium heart healthy   Complete by:  As directed    Discharge instructions   Complete by:  As directed    You were cared for by a hospitalist during your hospital stay. If you have any questions about your discharge medications or the care you received while you were in the hospital after you are discharged, you can call the unit and ask to speak with the hospitalist on call if the hospitalist that took care of you is not available. Once you are  discharged, your primary care physician will handle any further medical issues. Please note that NO REFILLS for any discharge medications will be authorized once you are discharged, as it is imperative that you return to your primary care physician (or establish a relationship with a primary care physician if you do not have one) for your aftercare needs so that they can reassess your need for medications and monitor your lab values.   Increase activity slowly   Complete by:  As directed      Allergies as of 08/26/2018      Reactions   Morphine And Related Other (See Comments)   Severe confusion   Diltiazem Hcl Other (See Comments)   bradycardia   Metoprolol Other (See Comments)   bradycardia   Toradol [ketorolac Tromethamine] Other (See Comments)   Dizzy and light headed   Hydrocodone Other (See Comments)   hallucinations   Sulfa Antibiotics Other (See Comments)   Caused extreme weakness/low sodium  Feb. 2020 per wife   Dutasteride Diarrhea   GI upset and incontinence      Medication List    TAKE these medications   acetaminophen 500 MG tablet Commonly known as:  TYLENOL Take 500 mg by mouth daily as needed for moderate pain.   amiodarone 200 MG tablet Commonly known as:  PACERONE Take 200 mg by mouth daily after supper.   amLODipine 2.5 MG tablet Commonly known as:  NORVASC Take 1 tablet (2.5 mg total) by mouth daily. What changed:  when to take this   beta carotene w/minerals tablet Take 1 tablet by mouth daily after breakfast.   cefadroxil 500 MG capsule Commonly known as:  DURICEF Take 500 mg by mouth 2 (two) times daily.   clindamycin-tretinoin gel Commonly known as:  ZIANA Apply 1 application topically 2 (two) times daily. Apply to scalp   clopidogrel 75 MG tablet Commonly known as:  PLAVIX Take 1 tablet (75 mg total) by mouth daily with breakfast. What changed:  when to take this   docusate sodium 100 MG capsule Commonly known as:  COLACE Take 100 mg by  mouth daily after breakfast.   ferrous sulfate 325 (65 FE) MG tablet Take 325 mg by mouth daily after supper.   finasteride 5 MG tablet Commonly known as:  PROSCAR Take 5 mg by mouth daily after breakfast.   levothyroxine 75 MCG tablet Commonly known as:  SYNTHROID, LEVOTHROID Take 1 tablet (75 mcg total) by mouth daily at 6 (six) AM. What changed:  when to take this   lovastatin 20 MG tablet Commonly known as:  MEVACOR Take 1 tablet (20 mg total) by mouth daily at 6 PM. What changed:  when to take this   meclizine 25 MG tablet Commonly known as:  ANTIVERT Take 1 tablet (25 mg total) by mouth 3 (three) times daily as needed for dizziness.   multivitamin with minerals Tabs tablet Take 1 tablet by mouth daily  after breakfast.   nitroGLYCERIN 0.4 MG SL tablet Commonly known as:  NITROSTAT Place 1 tablet (0.4 mg total) under the tongue every 5 (five) minutes as needed for chest pain.   PROBIOTIC DAILY PO Take 1 capsule by mouth daily after breakfast.   THERATEARS OP Place 1 drop into both eyes daily as needed (dry eyes).      Follow-up Information    Mosie Lukes, MD. Schedule an appointment as soon as possible for a visit in 1 week(s).   Specialty:  Family Medicine Contact information: [address] [place] [address] [phone]        Lelon Perla, MD. Schedule an appointment as soon as possible for a visit in 2 week(s).   Specialty:  Cardiology Contact information: [address] [place] [address] [phone]          Allergies  Allergen Reactions  . Morphine And Related Other (See Comments)    Severe confusion  . Diltiazem Hcl Other (See Comments)    bradycardia  . Metoprolol Other (See Comments)    bradycardia  . Toradol [Ketorolac Tromethamine] Other (See Comments)    Dizzy and light headed  . Hydrocodone Other (See Comments)    hallucinations  . Sulfa Antibiotics Other (See Comments)    Caused  extreme weakness/low sodium  Feb. 2020 per wife  . Dutasteride Diarrhea    GI upset and incontinence    Consultations:  Cardiology    Procedures/Studies: Ct Angio Head W Or Wo Contrast  Result Date: 08/25/2018 CLINICAL DATA:  Ataxia with suspected stroke EXAM: CT ANGIOGRAPHY HEAD AND NECK TECHNIQUE: Multidetector CT imaging of the head and neck was performed using the standard protocol during bolus administration of intravenous contrast. Multiplanar CT image reconstructions and MIPs were obtained to evaluate the vascular anatomy. Carotid stenosis measurements (when applicable) are obtained utilizing NASCET criteria, using the distal internal carotid diameter as the denominator. CONTRAST:  155mL ISOVUE-370 IOPAMIDOL (ISOVUE-370) INJECTION 76% COMPARISON:  Head CT 08/13/2018 Brain MRI 08/13/2018 FINDINGS: CT HEAD FINDINGS Brain: There is no mass, hemorrhage or extra-axial collection. The size and configuration of the ventricles and extra-axial CSF spaces are normal. There is no acute or chronic infarction. The brain parenchyma is normal. Skull: The visualized skull base, calvarium and extracranial soft tissues are normal. Sinuses/Orbits: No fluid levels or advanced mucosal thickening of the visualized paranasal sinuses. No mastoid or middle ear effusion. The orbits are normal. CTA NECK FINDINGS SKELETON: There is no bony spinal canal stenosis. No lytic or blastic lesion. OTHER NECK: Normal pharynx, larynx and major salivary glands. No cervical lymphadenopathy. Unremarkable thyroid gland. UPPER CHEST: Numerous calcified mediastinal lymph nodes. AORTIC ARCH: There is mild calcific atherosclerosis of the aortic arch. There is no aneurysm, dissection or hemodynamically significant stenosis of the visualized ascending aorta and aortic arch. Conventional 3 vessel aortic branching pattern. The visualized proximal subclavian arteries are widely patent. RIGHT CAROTID SYSTEM: --Common carotid artery: Widely patent  origin without common carotid artery dissection or aneurysm. --Internal carotid artery: Normal without aneurysm, dissection or stenosis. --External carotid artery: No acute abnormality. LEFT CAROTID SYSTEM: --Common carotid artery: Widely patent origin without common carotid artery dissection or aneurysm. --Internal carotid artery: Normal without aneurysm, dissection or stenosis. --External carotid artery: No acute abnormality. VERTEBRAL ARTERIES: Right dominant configuration. Both origins are normal. No dissection, occlusion or flow-limiting stenosis to the vertebrobasilar confluence. CTA HEAD FINDINGS POSTERIOR CIRCULATION: --Basilar artery: Normal. --Posterior cerebral arteries: Normal. Both  originate from the basilar artery. --Superior cerebellar arteries: Normal. --Inferior cerebellar arteries: Normal anterior and posterior inferior cerebellar arteries. ANTERIOR CIRCULATION: --Intracranial internal carotid arteries: Atherosclerotic calcification of the internal carotid arteries at the skull base without hemodynamically significant stenosis. --Anterior cerebral arteries: Normal. Both A1 segments are present. Patent anterior communicating artery. --Middle cerebral arteries: Normal. --Posterior communicating arteries: Absent bilaterally. VENOUS SINUSES: As permitted by contrast timing, patent. ANATOMIC VARIANTS: None DELAYED PHASE: No parenchymal contrast enhancement. Review of the MIP images confirms the above findings. IMPRESSION: 1. No emergent large vessel occlusion or hemodynamically significant stenosis of the arteries of the head and neck. 2. Numerous calcified mediastinal lymph nodes, possibly a sequela of prior granulomatous infection. 3.  Aortic atherosclerosis (ICD10-I70.0). Electronically Signed   By: Ulyses Jarred M.D.   On: 08/25/2018 19:52   Dg Chest 2 View  Result Date: 08/13/2018 CLINICAL DATA:  Dizziness and weakness. EXAM: CHEST - 2 VIEW COMPARISON:  February 16, 2018 FINDINGS: Calcified nodes  in the mediastinum unchanged. Stable cardiomegaly. Hila and mediastinum are stable. No pulmonary nodules or masses. No focal infiltrates. IMPRESSION: No active cardiopulmonary disease. Electronically Signed   By: Dorise Bullion III M.D   On: 08/13/2018 13:14   Ct Head Wo Contrast  Result Date: 08/13/2018 CLINICAL DATA:  Dizziness.  History of vertigo. EXAM: CT HEAD WITHOUT CONTRAST TECHNIQUE: Contiguous axial images were obtained from the base of the skull through the vertex without intravenous contrast. COMPARISON:  CT of 07/25/2018 and 08/05/2016. FINDINGS: Brain: There is no evidence of acute intracranial hemorrhage, mass lesion, brain edema or extra-axial fluid collection. There is stable moderate atrophy with diffuse prominence of the ventricles and subarachnoid spaces. There are stable chronic small vessel ischemic changes in the periventricular and subcortical white matter. There is no CT evidence of acute cortical infarction. Vascular: Prominent intracranial vascular calcifications. No hyperdense vessel identified. Skull: Negative for fracture or focal lesion. Sinuses/Orbits: The visualized paranasal sinuses and mastoid air cells are clear. No orbital abnormalities are seen. Other: None. IMPRESSION: Stable examination without evidence of acute process. Atrophy and chronic small vessel ischemic changes. Electronically Signed   By: Richardean Sale M.D.   On: 08/13/2018 13:06   Ct Angio Neck W And/or Wo Contrast  Result Date: 08/25/2018 CLINICAL DATA:  Ataxia with suspected stroke EXAM: CT ANGIOGRAPHY HEAD AND NECK TECHNIQUE: Multidetector CT imaging of the head and neck was performed using the standard protocol during bolus administration of intravenous contrast. Multiplanar CT image reconstructions and MIPs were obtained to evaluate the vascular anatomy. Carotid stenosis measurements (when applicable) are obtained utilizing NASCET criteria, using the distal internal carotid diameter as the  denominator. CONTRAST:  146mL ISOVUE-370 IOPAMIDOL (ISOVUE-370) INJECTION 76% COMPARISON:  Head CT 08/13/2018 Brain MRI 08/13/2018 FINDINGS: CT HEAD FINDINGS Brain: There is no mass, hemorrhage or extra-axial collection. The size and configuration of the ventricles and extra-axial CSF spaces are normal. There is no acute or chronic infarction. The brain parenchyma is normal. Skull: The visualized skull base, calvarium and extracranial soft tissues are normal. Sinuses/Orbits: No fluid levels or advanced mucosal thickening of the visualized paranasal sinuses. No mastoid or middle ear effusion. The orbits are normal. CTA NECK FINDINGS SKELETON: There is no bony spinal canal stenosis. No lytic or blastic lesion. OTHER NECK: Normal pharynx, larynx and major salivary glands. No cervical lymphadenopathy. Unremarkable thyroid gland. UPPER CHEST: Numerous calcified mediastinal lymph nodes. AORTIC ARCH: There is mild calcific atherosclerosis of the aortic arch. There is no aneurysm, dissection or hemodynamically  significant stenosis of the visualized ascending aorta and aortic arch. Conventional 3 vessel aortic branching pattern. The visualized proximal subclavian arteries are widely patent. RIGHT CAROTID SYSTEM: --Common carotid artery: Widely patent origin without common carotid artery dissection or aneurysm. --Internal carotid artery: Normal without aneurysm, dissection or stenosis. --External carotid artery: No acute abnormality. LEFT CAROTID SYSTEM: --Common carotid artery: Widely patent origin without common carotid artery dissection or aneurysm. --Internal carotid artery: Normal without aneurysm, dissection or stenosis. --External carotid artery: No acute abnormality. VERTEBRAL ARTERIES: Right dominant configuration. Both origins are normal. No dissection, occlusion or flow-limiting stenosis to the vertebrobasilar confluence. CTA HEAD FINDINGS POSTERIOR CIRCULATION: --Basilar artery: Normal. --Posterior cerebral  arteries: Normal. Both originate from the basilar artery. --Superior cerebellar arteries: Normal. --Inferior cerebellar arteries: Normal anterior and posterior inferior cerebellar arteries. ANTERIOR CIRCULATION: --Intracranial internal carotid arteries: Atherosclerotic calcification of the internal carotid arteries at the skull base without hemodynamically significant stenosis. --Anterior cerebral arteries: Normal. Both A1 segments are present. Patent anterior communicating artery. --Middle cerebral arteries: Normal. --Posterior communicating arteries: Absent bilaterally. VENOUS SINUSES: As permitted by contrast timing, patent. ANATOMIC VARIANTS: None DELAYED PHASE: No parenchymal contrast enhancement. Review of the MIP images confirms the above findings. IMPRESSION: 1. No emergent large vessel occlusion or hemodynamically significant stenosis of the arteries of the head and neck. 2. Numerous calcified mediastinal lymph nodes, possibly a sequela of prior granulomatous infection. 3.  Aortic atherosclerosis (ICD10-I70.0). Electronically Signed   By: Ulyses Jarred M.D.   On: 08/25/2018 19:52   Mr Brain Wo Contrast  Result Date: 08/13/2018 CLINICAL DATA:  Ataxia. EXAM: MRI HEAD WITHOUT CONTRAST TECHNIQUE: Multiplanar, multiecho pulse sequences of the brain and surrounding structures were obtained without intravenous contrast. COMPARISON:  Head CT 08/13/2018 FINDINGS: Brain: There is no evidence of acute infarct, mass, midline shift, or extra-axial fluid collection. A chronic microhemorrhage is noted in the lateral left frontal lobe. There is moderate cerebral atrophy. Patchy to confluent T2 hyperintensities in the cerebral white matter bilaterally are nonspecific but compatible with moderate chronic small vessel ischemic disease. Vascular: Major intracranial vascular flow voids are preserved. Skull and upper cervical spine: Unremarkable bone marrow signal. Sinuses/Orbits: Bilateral cataract extraction. Paranasal  sinuses and mastoid air cells are clear. Other: None. IMPRESSION: 1. No acute intracranial abnormality. 2. Moderate chronic small vessel ischemic disease and cerebral atrophy. Electronically Signed   By: Logan Bores M.D.   On: 08/13/2018 18:35       Discharge Exam: Vitals:   08/26/18 0647 08/26/18 1216  BP: 129/87 (!) 156/87  Pulse: 63 73  Resp: 16 18  Temp: 98.1 F (36.7 C) 97.9 F (36.6 C)  SpO2: 96% 98%    General: Pt is alert, awake, not in acute distress Cardiovascular: NSR, C3/J6 +, +systolic murmur  Respiratory: CTA bilaterally, no wheezing, no rhonchi Abdominal: Soft, NT, ND, bowel sounds + Extremities: no edema, no cyanosis    The results of significant diagnostics from this hospitalization (including imaging, microbiology, ancillary and laboratory) are listed below for reference.     Microbiology: No results found for this or any previous visit (from the past 240 hour(s)).   Labs: BNP (last 3 results) No results for input(s): BNP in the last 8760 hours. Basic Metabolic Panel: Recent Labs  Lab 08/25/18 1645 08/26/18 0518  NA 136 135  K 4.0 3.7  CL 102 105  CO2 28 23  GLUCOSE 100* 95  BUN 14 10  CREATININE 1.07 1.01  CALCIUM 9.0 8.7*   Liver Function Tests:  Recent Labs  Lab 08/25/18 1645  AST 24  ALT 22  ALKPHOS 58  BILITOT 0.7  PROT 6.9  ALBUMIN 3.5   No results for input(s): LIPASE, AMYLASE in the last 168 hours. No results for input(s): AMMONIA in the last 168 hours. CBC: Recent Labs  Lab 08/25/18 1645 08/26/18 0518  WBC 7.2 8.4  NEUTROABS 5.0  --   HGB 13.6 13.1  HCT 43.7 38.3*  MCV 90.5 86.5  PLT 346 333   Cardiac Enzymes: No results for input(s): CKTOTAL, CKMB, CKMBINDEX, TROPONINI in the last 168 hours. BNP: Invalid input(s): POCBNP CBG: Recent Labs  Lab 08/26/18 0610  GLUCAP 95   D-Dimer No results for input(s): DDIMER in the last 72 hours. Hgb A1c No results for input(s): HGBA1C in the last 72 hours. Lipid  Profile No results for input(s): CHOL, HDL, LDLCALC, TRIG, CHOLHDL, LDLDIRECT in the last 72 hours. Thyroid function studies No results for input(s): TSH, T4TOTAL, T3FREE, THYROIDAB in the last 72 hours.  Invalid input(s): FREET3 Anemia work up No results for input(s): VITAMINB12, FOLATE, FERRITIN, TIBC, IRON, RETICCTPCT in the last 72 hours. Urinalysis    Component Value Date/Time   COLORURINE YELLOW 07/25/2018 2332   APPEARANCEUR CLOUDY (A) 07/25/2018 2332   LABSPEC 1.015 07/25/2018 2332   PHURINE 6.0 07/25/2018 2332   GLUCOSEU NEGATIVE 07/25/2018 2332   GLUCOSEU NEG mg/dL 02/16/2010 0000   HGBUR TRACE (A) 07/25/2018 2332   BILIRUBINUR NEGATIVE 07/25/2018 2332   BILIRUBINUR negative 07/24/2018 1502   KETONESUR NEGATIVE 07/25/2018 2332   PROTEINUR NEGATIVE 07/25/2018 2332   UROBILINOGEN negative (A) 07/24/2018 1502   UROBILINOGEN 0.2 09/13/2014 1634   NITRITE POSITIVE (A) 07/25/2018 2332   LEUKOCYTESUR LARGE (A) 07/25/2018 2332   Sepsis Labs Invalid input(s): PROCALCITONIN,  WBC,  LACTICIDVEN Microbiology No results found for this or any previous visit (from the past 240 hour(s)).   Patient was seen and examined on the day of discharge and was found to be in stable condition. Time coordinating discharge: 40 minutes including assessment and coordination of care, as well as examination of the patient.   SIGNED:  Dessa Phi, DO [facility] [email or web address] 08/26/2018, 12:42 PM

## 2018-09-01 ENCOUNTER — Ambulatory Visit: Payer: PPO | Admitting: Family Medicine

## 2018-09-01 DIAGNOSIS — E039 Hypothyroidism, unspecified: Secondary | ICD-10-CM | POA: Diagnosis not present

## 2018-09-01 DIAGNOSIS — I10 Essential (primary) hypertension: Secondary | ICD-10-CM

## 2018-09-01 DIAGNOSIS — D649 Anemia, unspecified: Secondary | ICD-10-CM

## 2018-09-01 DIAGNOSIS — R739 Hyperglycemia, unspecified: Secondary | ICD-10-CM

## 2018-09-01 DIAGNOSIS — I480 Paroxysmal atrial fibrillation: Secondary | ICD-10-CM | POA: Diagnosis not present

## 2018-09-01 DIAGNOSIS — R42 Dizziness and giddiness: Secondary | ICD-10-CM

## 2018-09-01 DIAGNOSIS — R5381 Other malaise: Secondary | ICD-10-CM | POA: Diagnosis not present

## 2018-09-01 LAB — COMPREHENSIVE METABOLIC PANEL
ALT: 19 U/L (ref 0–53)
AST: 18 U/L (ref 0–37)
Albumin: 4 g/dL (ref 3.5–5.2)
Alkaline Phosphatase: 69 U/L (ref 39–117)
BUN: 20 mg/dL (ref 6–23)
CO2: 26 mEq/L (ref 19–32)
Calcium: 9 mg/dL (ref 8.4–10.5)
Chloride: 100 mEq/L (ref 96–112)
Creatinine, Ser: 1.12 mg/dL (ref 0.40–1.50)
GFR: 61.87 mL/min (ref >60.00)
Glucose, Bld: 82 mg/dL (ref 70–99)
Potassium: 4.3 mEq/L (ref 3.5–5.1)
Sodium: 135 mEq/L (ref 135–145)
Total Bilirubin: 0.6 mg/dL (ref 0.2–1.2)
Total Protein: 6.7 g/dL (ref 6.0–8.3)

## 2018-09-01 LAB — CBC
HCT: 42.4 % (ref 39.0–52.0)
Hemoglobin: 14.1 g/dL (ref 13.0–17.0)
MCHC: 33.3 g/dL (ref 30.0–36.0)
MCV: 89.6 fl (ref 78.0–100.0)
Platelets: 406 10*3/uL — ABNORMAL HIGH (ref 150.0–400.0)
RBC: 4.73 Mil/uL (ref 4.22–5.81)
RDW: 14.8 % (ref 11.5–15.5)
WBC: 9.8 10*3/uL (ref 4.0–10.5)

## 2018-09-01 LAB — VITAMIN D 25 HYDROXY (VIT D DEFICIENCY, FRACTURES): VITD: 37.03 ng/mL (ref 30.00–100.00)

## 2018-09-01 NOTE — Patient Instructions (Signed)
60 to 70 ounces of fluids daily Protein every 4-5 hours. Small, frequent meals Do not move too quickly  Vertigo Vertigo is the feeling that you or your surroundings are moving when they are not. Vertigo can be dangerous if it occurs while you are doing something that could endanger you or others, such as driving. What are the causes? This condition is caused by a disturbance in the signals that are sent by your body's sensory systems to your brain. Different causes of a disturbance can lead to vertigo, including:  Infections, especially in the inner ear.  A bad reaction to a drug, or misuse of alcohol and medicines.  Withdrawal from drugs or alcohol.  Quickly changing positions, as when lying down or rolling over in bed.  Migraine headaches.  Decreased blood flow to the brain.  Decreased blood pressure.  Increased pressure in the brain from a head or neck injury, stroke, infection, tumor, or bleeding.  Central nervous system disorders. What are the signs or symptoms? Symptoms of this condition usually occur when you move your head or your eyes in different directions. Symptoms may start suddenly, and they usually last for less than a minute. Symptoms may include:  Loss of balance and falling.  Feeling like you are spinning or moving.  Feeling like your surroundings are spinning or moving.  Nausea and vomiting.  Blurred vision or double vision.  Difficulty hearing.  Slurred speech.  Dizziness.  Involuntary eye movement (nystagmus). Symptoms can be mild and cause only slight annoyance, or they can be severe and interfere with daily life. Episodes of vertigo may return (recur) over time, and they are often triggered by certain movements. Symptoms may improve over time. How is this diagnosed? This condition may be diagnosed based on medical history and the quality of your nystagmus. Your health care provider may test your eye movements by asking you to quickly change  positions to trigger the nystagmus. This may be called the Dix-Hallpike test, head thrust test, or roll test. You may be referred to a health care provider who specializes in ear, nose, and throat (ENT) problems (otolaryngologist) or a provider who specializes in disorders of the central nervous system (neurologist). You may have additional testing, including:  A physical exam.  Blood tests.  MRI.  A CT scan.  An electrocardiogram (ECG). This records electrical activity in your heart.  An electroencephalogram (EEG). This records electrical activity in your brain.  Hearing tests. How is this treated? Treatment for this condition depends on the cause and the severity of the symptoms. Treatment options include:  Medicines to treat nausea or vertigo. These are usually used for severe cases. Some medicines that are used to treat other conditions may also reduce or eliminate vertigo symptoms. These include: ? Medicines that control allergies (antihistamines). ? Medicines that control seizures (anticonvulsants). ? Medicines that relieve depression (antidepressants). ? Medicines that relieve anxiety (sedatives).  Head movements to adjust your inner ear back to normal. If your vertigo is caused by an ear problem, your health care provider may recommend certain movements to correct the problem.  Surgery. This is rare. Follow these instructions at home: Safety  Move slowly.Avoid sudden body or head movements.  Avoid driving.  Avoid operating heavy machinery.  Avoid doing any tasks that would cause danger to you or others if you would have a vertigo episode during the task.  If you have trouble walking or keeping your balance, try using a cane for stability. If you feel dizzy  or unstable, sit down right away.  Return to your normal activities as told by your health care provider. Ask your health care provider what activities are safe for you. General instructions  Take  over-the-counter and prescription medicines only as told by your health care provider.  Avoid certain positions or movements as told by your health care provider.  Drink enough fluid to keep your urine clear or pale yellow.  Keep all follow-up visits as told by your health care provider. This is important. Contact a health care provider if:  Your medicines do not relieve your vertigo or they make it worse.  You have a fever.  Your condition gets worse or you develop new symptoms.  Your family or friends notice any behavioral changes.  Your nausea or vomiting gets worse.  You have numbness or a "pins and needles" sensation in part of your body. Get help right away if:  You have difficulty moving or speaking.  You are always dizzy.  You faint.  You develop severe headaches.  You have weakness in your hands, arms, or legs.  You have changes in your hearing or vision.  You develop a stiff neck.  You develop sensitivity to light. This information is not intended to replace advice given to you by your health care provider. Make sure you discuss any questions you have with your health care provider. Document Released: 03/24/2005 Document Revised: 11/26/2015 Document Reviewed: 10/07/2014 Elsevier Interactive Patient Education  2019 [employer].

## 2018-09-03 NOTE — Assessment & Plan Note (Signed)
hgba1c acceptable, minimize simple carbs. Increase exercise as tolerated.  

## 2018-09-03 NOTE — Assessment & Plan Note (Signed)
Well controlled, no changes to meds. Encouraged heart healthy diet such as the DASH diet and exercise as tolerated.  °

## 2018-09-03 NOTE — Assessment & Plan Note (Signed)
On Levothyroxine, continue to monitor 

## 2018-09-03 NOTE — Assessment & Plan Note (Signed)
Hydrate well. He has PT for vestibular rehab scheduled for next week. He is feeling better. Reminded to arise slowly and methodically and eat small, infrequent meals with lean proteins and complex carbs.

## 2018-09-03 NOTE — Assessment & Plan Note (Signed)
Is following with cardiology and feeling well today. Unclear if cardiac conditions contributed to recent hospitalization. Has been stable since then.

## 2018-09-03 NOTE — Progress Notes (Signed)
Subjective:    Patient ID: Justin [name], male    DOB: 1928/11/02, 85 y.o.   MRN: [id number]  No chief complaint on file.   HPI Patient is in today for hospital follow up accompanied by his wife. He is feeling better since his stay in the hospital. After an extensive neurologic work up and cardiac evaluation they diagnosed him with vertigo. He is scheduled to undergo vestibular rehab. No falls or trauam. He feels woozey/dizzy upon arising quickly although it is improving. Denies CP/palp/SOB/HA/fevers/GI or GU c/o. Taking meds as prescribed  Past Medical History:  Diagnosis Date  . Basal cell carcinoma   . BPH (benign prostatic hypertrophy)   . CAD (coronary artery disease) 1991   a. H/o MI w/ PTCA;  b. s/p CABG by Dr Roxan Hockey w/ LIMA-LAD, SVG-OM1-OM3, SVG-D1, SVG-PDA  . CHF (congestive heart failure) ([place])   . Complication of anesthesia    Wife reports history of confusion after last surgery in 10/2016-lasted a few days  . HTN (hypertension)   . Hyperlipidemia   . Internal hemorrhoids   . Moderate aortic stenosis    a. 08/2014 Echo: EF 55-60%, no rwma, Gr 1 DD, mod AS, mildly dil asc Ao, mild MR, sev dil LA.  . NSTEMI (non-ST elevated myocardial infarction) ([place]) 05/2017    PCI with DES to SVG-OM1. SVG-OM3 occluded, LIMA-LAD and SVG-RCA patent  . PAF (paroxysmal atrial fibrillation) ([place])    a. Dx 08/2014;  b. CHA2DS2VASc = 4 - decision made to withold [place] 2/2 unsteady gait and syncope.  . Sigmoid diverticulitis   . Syncope    a. 08/2014 - ? orthostatic    Past Surgical History:  Procedure Laterality Date  . CARDIAC CATHETERIZATION    . COLONOSCOPY    . CORONARY ARTERY BYPASS GRAFT  2000   Dr Roxan Hockey  LIMA-LAD, SVG-OM1-OM3, SVG-D1, SVG-PDA.   [name] CORONARY STENT INTERVENTION N/A 06/13/2017   Procedure: CORONARY STENT INTERVENTION;  Surgeon: Lorretta Harp, MD;  Location: [facility];  Service: Cardiovascular;  Laterality: N/A;  . CYSTOSCOPY N/A 12/07/2016   Procedure: CYSTOSCOPY;  Surgeon: Cleon Gustin, MD;  Location: [facility];  Service: Urology;  Laterality: N/A;  NEEDS 30 MIN TOTAL FOR SURGERY  . CYSTOSCOPY WITH URETHRAL DILATATION N/A 02/21/2017   Procedure: DILATATION OF BLADDER NECK WITH INJECTION OF MITOMYCIN C;  Surgeon: Cleon Gustin, MD;  Location: [facility];  Service: Urology;  Laterality: N/A;  . EYE SURGERY     bil cataract  . HIP ARTHROPLASTY Left 10/14/2015   Procedure: LEFT HIP HEMI ARTHROPLASTY;  Surgeon: [name], MD;  Location: [facility];  Service: Orthopedics;  Laterality: Left;  . LEFT HEART CATH AND CORS/GRAFTS ANGIOGRAPHY N/A 06/13/2017   Procedure: LEFT HEART CATH AND CORS/GRAFTS ANGIOGRAPHY;  Surgeon: Lorretta Harp, MD;  Location: [facility];  Service: Cardiovascular;  Laterality: N/A;  . SHOULDER SURGERY     x2  . TONSILLECTOMY    . TRANSTHORACIC ECHOCARDIOGRAM  10-06-2016   dr Stanford Breed   mild LVH, ef 40-45%, diffuse hypokinesis/ moderate AV calcification leaflets and annulus,  severe thickened leaflets, mod.-sev. aortic stenosis (Valva area 0.61cm^2, mean grandient 36mmHg, peak gradiant 41mmHg)/  moderate thickened , mild calcified MV leaflets without stenosis, moderate MR/  severe LAE/ trivial PR and TR  . TRANSURETHRAL RESECTION OF BLADDER NECK N/A 12/07/2016   Procedure: TRANSURETHRAL RESECTION OF BLADDER NECK;  Surgeon: Cleon Gustin, MD;  Location: [facility];  Service: Urology;  Laterality: N/A;  . XI ROBOTIC ASSISTED SIMPLE PROSTATECTOMY N/A 11/04/2016   Procedure: XI ROBOTIC ASSISTED SIMPLE PROSTATECTOMY;  Surgeon: Cleon Gustin, MD;  Location: [facility];  Service: Urology;  Laterality: N/A;    Family History  Problem Relation Age of Onset  . Coronary artery disease Father   . Hyperlipidemia Father   . Heart attack Father   . Alcohol abuse Father   . Bipolar disorder Mother   . Heart disease Mother   . Alcohol abuse Brother   . Cancer Brother   . Cancer Maternal Grandfather         oral cancer, chew tobacco  . Cancer Sister   . Glaucoma Sister   . Alcohol abuse Other     Social History   Socioeconomic History  . Marital status: Married    Spouse name: Not on file  . Number of children: 2  . Years of education: Not on file  . Highest education level: Not on file  Occupational History  . 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 Financial resource strain: Not very hard  . Food insecurity:    Worry: Never true    Inability: Never true  . Transportation needs:    Medical: No    Non-medical: No  Tobacco Use  . Smoking status: Former Smoker    Packs/day: 0.50    Years: 15.00    Pack years: 7.50    Last attempt to quit: 06/29/1971    Years since quitting: 47.2  . Smokeless tobacco: Never Used  Substance and Sexual Activity  . Alcohol use: Yes    Alcohol/week: 1.0 - 2.0 standard drinks    Types: 1 - 2 Glasses of wine per week    Comment: alternates wine and liquor. 1/2 pint liquor/week  . Drug use: No  . 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 Physical activity:    Days per week: 0 days    Minutes per session: 0 min  . Stress: Not at all  Relationships  . Social connections:    Talks on phone: Not on file    Gets together: Not on file    Attends religious service: Not on file    Active member of club or organization: Not on file    Attends meetings of clubs or organizations: Not on file    Relationship status: Not on file  . Intimate partner violence:    Fear of current or ex partner: Not on file    Emotionally abused: Not on file    Physically abused: Not on file    Forced sexual activity: Not on file  Other Topics Concern  . Not on file  Social History Narrative   Pt and wife have an Independent home at [facility], wants to move to an apartment soon.    Outpatient Medications Prior to Visit  Medication Sig Dispense Refill    . acetaminophen (TYLENOL) 500 MG tablet Take 500 mg by mouth daily as needed for moderate pain.    [name] amiodarone (PACERONE) 200 MG tablet Take 200 mg by mouth daily after supper.     [name] amLODipine (NORVASC) 2.5 MG tablet Take 1 tablet (2.5 mg total) by mouth daily. (Patient taking differently: Take 2.5 mg by mouth daily after breakfast. ) 90 tablet 3  . beta carotene w/minerals (OCUVITE) tablet Take 1 tablet by mouth daily after breakfast.     .  Carboxymethylcellulose Sodium (THERATEARS OP) Place 1 drop into both eyes daily as needed (dry eyes).     . cefadroxil (DURICEF) 500 MG capsule Take 500 mg by mouth 2 (two) times daily.    . clindamycin-tretinoin (ZIANA) gel Apply 1 application topically 2 (two) times daily. Apply to scalp    . clopidogrel (PLAVIX) 75 MG tablet Take 1 tablet (75 mg total) by mouth daily with breakfast. (Patient taking differently: Take 75 mg by mouth daily after breakfast. ) 90 tablet 3  . docusate sodium (COLACE) 100 MG capsule Take 100 mg by mouth daily after breakfast.     . ferrous sulfate 325 (65 FE) MG tablet Take 325 mg by mouth daily after supper.     . finasteride (PROSCAR) 5 MG tablet Take 5 mg by mouth daily after breakfast.     . levothyroxine (SYNTHROID, LEVOTHROID) 75 MCG tablet Take 1 tablet (75 mcg total) by mouth daily at 6 (six) AM. (Patient taking differently: Take 75 mcg by mouth daily before breakfast. ) 30 tablet 0  . lovastatin (MEVACOR) 20 MG tablet Take 1 tablet (20 mg total) by mouth daily at 6 PM. (Patient taking differently: Take 20 mg by mouth daily after supper. ) 90 tablet 1  . meclizine (ANTIVERT) 25 MG tablet Take 1 tablet (25 mg total) by mouth 3 (three) times daily as needed for dizziness. 30 tablet 0  . Multiple Vitamin (MULTIVITAMIN WITH MINERALS) TABS tablet Take 1 tablet by mouth daily after breakfast.     . nitroGLYCERIN (NITROSTAT) 0.4 MG SL tablet Place 1 tablet (0.4 mg total) under the tongue every 5 (five) minutes as needed for chest  pain. 25 tablet 12  . Probiotic Product (PROBIOTIC DAILY PO) Take 1 capsule by mouth daily after breakfast.      No facility-administered medications prior to visit.     Allergies  Allergen Reactions  . Morphine And Related Other (See Comments)    Severe confusion  . Diltiazem Hcl Other (See Comments)    bradycardia  . Metoprolol Other (See Comments)    bradycardia  . Toradol [Ketorolac Tromethamine] Other (See Comments)    Dizzy and light headed  . Hydrocodone Other (See Comments)    hallucinations  . Sulfa Antibiotics Other (See Comments)    Caused extreme weakness/low sodium  Feb. 2020 per wife  . Dutasteride Diarrhea    GI upset and incontinence    Review of Systems  Constitutional: Negative for fever and malaise/fatigue.  HENT: Positive for congestion and hearing loss.   Eyes: Negative for blurred vision.  Respiratory: Negative for shortness of breath.   Cardiovascular: Negative for chest pain, palpitations and leg swelling.  Gastrointestinal: Negative for abdominal pain, blood in stool and nausea.  Genitourinary: Negative for dysuria and frequency.  Musculoskeletal: Negative for falls.  Skin: Negative for rash.  Neurological: Positive for dizziness. Negative for loss of consciousness and headaches.  Endo/Heme/Allergies: Negative for environmental allergies.  Psychiatric/Behavioral: Negative for depression. The patient is not nervous/anxious.        Objective:    Physical Exam Vitals signs and nursing note reviewed.  Constitutional:      General: He is not in acute distress.    Appearance: He is well-developed.  HENT:     Head: Normocephalic and atraumatic.     Nose: Nose normal.  Eyes:     General:        Right eye: No discharge.        Left eye: No  discharge.  Neck:     Musculoskeletal: Normal range of motion and neck supple.  Cardiovascular:     Rate and Rhythm: Normal rate. Rhythm irregular.     Heart sounds: Murmur present.  Pulmonary:     Effort:  Pulmonary effort is normal.     Breath sounds: Normal breath sounds.  Abdominal:     General: Bowel sounds are normal.     Palpations: Abdomen is soft.     Tenderness: There is no abdominal tenderness.  Skin:    General: Skin is warm and dry.  Neurological:     Mental Status: He is alert and oriented to person, place, and time.     BP 126/66 (BP Location: Left Arm, Patient Position: Sitting, Cuff Size: Normal)   Pulse 67   Temp 97.7 F (36.5 C) (Oral)   Resp 18   Wt 147 lb 3.2 oz (66.8 kg)   SpO2 98%   BMI 24.50 kg/m  Wt Readings from Last 3 Encounters:  09/01/18 147 lb 3.2 oz (66.8 kg)  08/26/18 140 lb 12.8 oz (63.9 kg)  08/13/18 145 lb (65.8 kg)     Lab Results  Component Value Date   WBC 9.8 09/01/2018   HGB 14.1 09/01/2018   HCT 42.4 09/01/2018   PLT 406.0 (H) 09/01/2018   GLUCOSE 82 09/01/2018   CHOL 154 06/11/2017   TRIG 39 06/11/2017   HDL 76 06/11/2017   LDLCALC 70 06/11/2017   ALT 19 09/01/2018   AST 18 09/01/2018   NA 135 09/01/2018   K 4.3 09/01/2018   CL 100 09/01/2018   CREATININE 1.12 09/01/2018   BUN 20 09/01/2018   CO2 26 09/01/2018   TSH 7.545 (H) 07/25/2018   PSA 10.24 (H) 02/06/2009   INR 1.35 10/13/2015   HGBA1C 5.2 05/03/2016    Lab Results  Component Value Date   TSH 7.545 (H) 07/25/2018   Lab Results  Component Value Date   WBC 9.8 09/01/2018   HGB 14.1 09/01/2018   HCT 42.4 09/01/2018   MCV 89.6 09/01/2018   PLT 406.0 (H) 09/01/2018   Lab Results  Component Value Date   NA 135 09/01/2018   K 4.3 09/01/2018   CO2 26 09/01/2018   GLUCOSE 82 09/01/2018   BUN 20 09/01/2018   CREATININE 1.12 09/01/2018   BILITOT 0.6 09/01/2018   ALKPHOS 69 09/01/2018   AST 18 09/01/2018   ALT 19 09/01/2018   PROT 6.7 09/01/2018   ALBUMIN 4.0 09/01/2018   CALCIUM 9.0 09/01/2018   ANIONGAP 7 08/26/2018   GFR 61.87 09/01/2018   Lab Results  Component Value Date   CHOL 154 06/11/2017   Lab Results  Component Value Date   HDL 76  06/11/2017   Lab Results  Component Value Date   LDLCALC 70 06/11/2017   Lab Results  Component Value Date   TRIG 39 06/11/2017   Lab Results  Component Value Date   CHOLHDL 2.0 06/11/2017   Lab Results  Component Value Date   HGBA1C 5.2 05/03/2016       Assessment & Plan:   Problem List Items Addressed This Visit    Hyperglycemia    hgba1c acceptable, minimize simple carbs. Increase exercise as tolerated.       Vertigo    Hydrate well. He has PT for vestibular rehab scheduled for next week. He is feeling better. Reminded to arise slowly and methodically and eat small, infrequent meals with lean proteins and complex carbs.  Relevant Orders   Ambulatory referral to Physical Therapy   Paroxysmal atrial fibrillation [facility])    Is following with cardiology and feeling well today. Unclear if cardiac conditions contributed to recent hospitalization. Has been stable since then.       HTN (hypertension)    Well controlled, no changes to meds. Encouraged heart healthy diet such as the DASH diet and exercise as tolerated.       Hypothyroidism    On Levothyroxine, continue to monitor       Other Visit Diagnoses    Hypocalcemia    -  Primary   Relevant Orders   Comprehensive metabolic panel (Completed)   VITAMIN D 25 Hydroxy (Vit-D Deficiency, Fractures) (Completed)   Anemia, unspecified type       Relevant Orders   CBC (Completed)   Debility       Relevant Orders   Ambulatory referral to Physical Therapy      I am having Salem T. Gatlin "[name]" maintain his Probiotic Product (PROBIOTIC DAILY PO), meclizine, finasteride, beta carotene w/minerals, amiodarone, ferrous sulfate, docusate sodium, acetaminophen, multivitamin with minerals, Carboxymethylcellulose Sodium (THERATEARS OP), lovastatin, amLODipine, nitroGLYCERIN, clopidogrel, levothyroxine, cefadroxil, and clindamycin-tretinoin.  No orders of the defined types were placed in this encounter.    [name],  MD

## 2018-09-05 ENCOUNTER — Telehealth: Payer: Self-pay

## 2018-09-05 ENCOUNTER — Ambulatory Visit: Payer: PPO | Admitting: Cardiology

## 2018-09-05 ENCOUNTER — Encounter: Payer: Self-pay | Admitting: Cardiology

## 2018-09-05 VITALS — BP 130/64 | HR 66 | Ht 64.0 in | Wt 148.2 lb

## 2018-09-05 DIAGNOSIS — G20 Parkinson's disease: Secondary | ICD-10-CM | POA: Diagnosis not present

## 2018-09-05 DIAGNOSIS — I350 Nonrheumatic aortic (valve) stenosis: Secondary | ICD-10-CM

## 2018-09-05 DIAGNOSIS — Z9861 Coronary angioplasty status: Secondary | ICD-10-CM

## 2018-09-05 DIAGNOSIS — Z7901 Long term (current) use of anticoagulants: Secondary | ICD-10-CM

## 2018-09-05 DIAGNOSIS — R55 Syncope and collapse: Secondary | ICD-10-CM | POA: Diagnosis not present

## 2018-09-05 DIAGNOSIS — Z951 Presence of aortocoronary bypass graft: Secondary | ICD-10-CM | POA: Diagnosis not present

## 2018-09-05 DIAGNOSIS — G20A1 Parkinson's disease without dyskinesia, without mention of fluctuations: Secondary | ICD-10-CM | POA: Insufficient documentation

## 2018-09-05 DIAGNOSIS — I2510 Atherosclerotic heart disease of native coronary artery without angina pectoris: Secondary | ICD-10-CM

## 2018-09-05 DIAGNOSIS — I480 Paroxysmal atrial fibrillation: Secondary | ICD-10-CM | POA: Diagnosis not present

## 2018-09-05 MED ORDER — ASPIRIN EC 81 MG PO TBEC: 81.0000 mg | DELAYED_RELEASE_TABLET | Freq: Every day | ORAL | 3 refills | Status: AC

## 2018-09-05 NOTE — Assessment & Plan Note (Signed)
NSTEMI Dec 2018- PCI with DES to SVG-OM1. SVG-OM3 occluded, LIMA-LAD and SVG-RCA patent

## 2018-09-05 NOTE — Assessment & Plan Note (Signed)
Eliquis stopped after his last syncopal spell Feb 2020

## 2018-09-05 NOTE — Patient Instructions (Signed)
Medication Instructions:  START Aspirin 81mg  Take 1 tablet once a day. This medication can be purchased over the counter If you need a refill on your cardiac medications before your next appointment, please call your pharmacy.   Lab work: None  If you have labs (blood work) drawn today and your tests are completely normal, you will receive your results only by: [name] MyChart Message (if you have MyChart) OR . A paper copy in the mail If you have any lab test that is abnormal or we need to change your treatment, we will call you to review the results.  Testing/Procedures: None   Follow-Up: At [facility], you and your health needs are our priority.  As part of our continuing mission to provide you with exceptional heart care, we have created designated Provider Care Teams.  These Care Teams include your primary Cardiologist (physician) and Advanced Practice Providers (APPs -  Physician Assistants and Nurse Practitioners) who all work together to provide you with the care you need, when you need it. You will need a follow up appointment in 3 months. You may see Dr Kirk Ruths or one of the following Advanced Practice Providers on your designated Care Team:   Kerin Ransom, PA-C Roby Lofts, [place] . Sande Rives, PA-C  Any Other Special Instructions Will Be Listed Below (If Applicable).

## 2018-09-05 NOTE — Progress Notes (Signed)
09/05/2018 Justin [name]   Apr 24, 1929  [id number]  Primary Physician Mosie Lukes, MD Primary Cardiologist: Dr Stanford Breed  HPI:  Pleasant 85 y/o male followed by Dr Stanford Breed with a history of CABG (2001) and PAF (on amiodarone). Had a NSTEMI 12/18; cath showed occluded native vessels with a 95% SVG to OM1 stenosis that was treated withPCI with DES. The SVG to OM2 and 3 was occluded, the SVG to RCA and LIMA to LAD were patent.  Metoprolol and cardizem DC'd duringpreviousov due to bradycardia.he was seen with c/o syncope 7/19 and there wasa concern for post termination pauses.Holter monitor7/19 showedno significant arrhythmia. Echo done 12/19 showed his EF to be 40-45% with moderate LVH, grade 1 DD, moderate to severe aortic stenosis with mean gradient 30 mmHg, AVA 0.5 cm2, mild MR, mild to moderate LAE, mild RV dysfunction.   He was recently seen in the ED 08/26/2018 after a syncopal spell.  The patient tells me he was walking and went out without warning.  He was a little confused in the hospital and overall is frail. In addition to his cardiac problems he also has Parkinson's.    He is in the office today for follow up.  Dr Stanford Breed stopped his Eliquis after this last episode.  The patient is not interested in TAVR evaluation or further monitoring.  He says he has been doing well since he was in the ED- no further syncope.    Current Outpatient Medications  Medication Sig Dispense Refill  . acetaminophen (TYLENOL) 500 MG tablet Take 500 mg by mouth daily as needed for moderate pain.    [name] amiodarone (PACERONE) 200 MG tablet Take 200 mg by mouth daily after supper.     [name] amLODipine (NORVASC) 2.5 MG tablet Take 1 tablet (2.5 mg total) by mouth daily. (Patient taking differently: Take 2.5 mg by mouth daily after breakfast. ) 90 tablet 3  . beta carotene w/minerals (OCUVITE) tablet Take 1 tablet by mouth daily after breakfast.     . Carboxymethylcellulose Sodium (THERATEARS OP) Place 1  drop into both eyes daily as needed (dry eyes).     . cefadroxil (DURICEF) 500 MG capsule Take 500 mg by mouth 2 (two) times daily.    . clindamycin-tretinoin (ZIANA) gel Apply 1 application topically 2 (two) times daily. Apply to scalp    . clopidogrel (PLAVIX) 75 MG tablet Take 1 tablet (75 mg total) by mouth daily with breakfast. (Patient taking differently: Take 75 mg by mouth daily after breakfast. ) 90 tablet 3  . docusate sodium (COLACE) 100 MG capsule Take 100 mg by mouth daily after breakfast.     . ferrous sulfate 325 (65 FE) MG tablet Take 325 mg by mouth daily after supper.     . finasteride (PROSCAR) 5 MG tablet Take 5 mg by mouth daily after breakfast.     . levothyroxine (SYNTHROID, LEVOTHROID) 75 MCG tablet Take 1 tablet (75 mcg total) by mouth daily at 6 (six) AM. (Patient taking differently: Take 75 mcg by mouth daily before breakfast. ) 30 tablet 0  . lovastatin (MEVACOR) 20 MG tablet Take 1 tablet (20 mg total) by mouth daily at 6 PM. (Patient taking differently: Take 20 mg by mouth daily after supper. ) 90 tablet 1  . meclizine (ANTIVERT) 25 MG tablet Take 1 tablet (25 mg total) by mouth 3 (three) times daily as needed for dizziness. 30 tablet 0  . Multiple Vitamin (MULTIVITAMIN WITH MINERALS) TABS tablet Take 1  tablet by mouth daily after breakfast.     . Probiotic Product (PROBIOTIC DAILY PO) Take 1 capsule by mouth daily after breakfast.     . aspirin EC 81 MG tablet Take 1 tablet (81 mg total) by mouth daily. 90 tablet 3  . nitroGLYCERIN (NITROSTAT) 0.4 MG SL tablet Place 1 tablet (0.4 mg total) under the tongue every 5 (five) minutes as needed for chest pain. (Patient not taking: Reported on 09/05/2018) 25 tablet 12   No current facility-administered medications for this visit.     Allergies  Allergen Reactions  . Morphine And Related Other (See Comments)    Severe confusion  . Diltiazem Hcl Other (See Comments)    bradycardia  . Metoprolol Other (See Comments)     bradycardia  . Toradol [Ketorolac Tromethamine] Other (See Comments)    Dizzy and light headed  . Hydrocodone Other (See Comments)    hallucinations  . Sulfa Antibiotics Other (See Comments)    Caused extreme weakness/low sodium  Feb. 2020 per wife  . Dutasteride Diarrhea    GI upset and incontinence    Past Medical History:  Diagnosis Date  . Basal cell carcinoma   . BPH (benign prostatic hypertrophy)   . CAD (coronary artery disease) 1991   a. H/o MI w/ PTCA;  b. s/p CABG by Dr Roxan Hockey w/ LIMA-LAD, SVG-OM1-OM3, SVG-D1, SVG-PDA  . CHF (congestive heart failure) ([place])   . Complication of anesthesia    Wife reports history of confusion after last surgery in 10/2016-lasted a few days  . HTN (hypertension)   . Hyperlipidemia   . Internal hemorrhoids   . Moderate aortic stenosis    a. 08/2014 Echo: EF 55-60%, no rwma, Gr 1 DD, mod AS, mildly dil asc Ao, mild MR, sev dil LA.  . NSTEMI (non-ST elevated myocardial infarction) ([place]) 05/2017    PCI with DES to SVG-OM1. SVG-OM3 occluded, LIMA-LAD and SVG-RCA patent  . PAF (paroxysmal atrial fibrillation) ([place])    a. Dx 08/2014;  b. CHA2DS2VASc = 4 - decision made to withold [place] 2/2 unsteady gait and syncope.  . Sigmoid diverticulitis   . Syncope    a. 08/2014 - ? orthostatic    Social History   Socioeconomic History  . Marital status: Married    Spouse name: Not on file  . Number of children: 2  . Years of education: Not on file  . Highest education level: Not on file  Occupational History  . 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 Financial resource strain: Not very hard  . Food insecurity:    Worry: Never true    Inability: Never true  . Transportation needs:    Medical: No    Non-medical: No  Tobacco Use  . Smoking status: Former Smoker    Packs/day: 0.50    Years: 15.00    Pack years: 7.50    Last attempt to quit: 06/29/1971    Years since quitting: 47.2  . Smokeless tobacco: Never Used    Substance and Sexual Activity  . Alcohol use: Yes    Alcohol/week: 1.0 - 2.0 standard drinks    Types: 1 - 2 Glasses of wine per week    Comment: alternates wine and liquor. 1/2 pint liquor/week  . Drug use: No  . 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 Physical activity:    Days per week: 0 days  Minutes per session: 0 min  . Stress: Not at all  Relationships  . Social connections:    Talks on phone: Not on file    Gets together: Not on file    Attends religious service: Not on file    Active member of club or organization: Not on file    Attends meetings of clubs or organizations: Not on file    Relationship status: Not on file  . Intimate partner violence:    Fear of current or ex partner: Not on file    Emotionally abused: Not on file    Physically abused: Not on file    Forced sexual activity: Not on file  Other Topics Concern  . Not on file  Social History Narrative   Pt and wife have an Independent home at [facility], wants to move to an apartment soon.     Family History  Problem Relation Age of Onset  . Coronary artery disease Father   . Hyperlipidemia Father   . Heart attack Father   . Alcohol abuse Father   . Bipolar disorder Mother   . Heart disease Mother   . Alcohol abuse Brother   . Cancer Brother   . Cancer Maternal Grandfather        oral cancer, chew tobacco  . Cancer Sister   . Glaucoma Sister   . Alcohol abuse Other      Review of Systems: General: negative for chills, fever, night sweats or weight changes.  Cardiovascular: negative for chest pain, dyspnea on exertion, edema, orthopnea, palpitations, paroxysmal nocturnal dyspnea or shortness of breath Dermatological: negative for rash Respiratory: negative for cough or wheezing Urologic: negative for hematuria Abdominal: negative for nausea, vomiting, diarrhea, bright red blood per rectum, melena,  or hematemesis Neurologic: negative for visual changes, syncope, or dizziness All other systems reviewed and are otherwise negative except as noted above.    Blood pressure 130/64, pulse 66, height 5\' 4"  (1.626 m), weight 148 lb 3.2 oz (67.2 kg), SpO2 98 %.  General appearance: alert, cooperative, appears stated age and no distress Lungs: clear to auscultation bilaterally Heart: regular rate and rhythm Extremities: no edema Skin: warm and dry Neurologic: Grossly normal, cogwheel rigidity noted   ASSESSMENT AND PLAN:   Syncope Syncope- recurrent- pt declines further work up  CAD S/P percutaneous coronary angioplasty NSTEMI Dec 2018- PCI with DES to SVG-OM1. SVG-OM3 occluded, LIMA-LAD and SVG-RCA patent  Hx of CABG  s/p CABG by Dr Roxan Hockey w/ LIMA-LAD, SVG-OM1-OM3, SVG-D1, SVG-PDA-2001  Paroxysmal atrial fibrillation (HCC) NSR on Amiodarone  Anticoagulated Eliquis stopped after his last syncopal spell Feb 2020  Aortic stenosis Echo Dec 2018- moderate AS  Parkinson disease ([place]) He apparently did worse on medications for this   PLAN  Discussed with Dr Stanford Breed. No further work up.  I did have him resume ASA 81 mg daily now that he is off Eliquis.   Kerin Ransom PA-C 09/05/2018 4:45 PM

## 2018-09-05 NOTE — Assessment & Plan Note (Signed)
Echo Dec 2018- moderate AS

## 2018-09-05 NOTE — Assessment & Plan Note (Signed)
He apparently did worse on medications for this

## 2018-09-05 NOTE — Assessment & Plan Note (Signed)
s/p CABG by Dr Roxan Hockey w/ LIMA-LAD, SVG-OM1-OM3, SVG-D1, SVG-PDA-2001

## 2018-09-05 NOTE — Assessment & Plan Note (Signed)
NSR on Amiodarone- 

## 2018-09-05 NOTE — Telephone Encounter (Signed)
Copied from [place] [id number]. Topic: Referral - Question >> Sep 05, 2018  8:35 AM Vernona Rieger wrote: Reason for CRM: [facility] called and said they received a referral for him but it was not signed by the doctor/ please re-send with the doctor's signature.   [phone] attention :: Rehab Call back if needed @ [phone]    [facility] can you resend the referral for patient w/PCP stamp  Please advise

## 2018-09-05 NOTE — Assessment & Plan Note (Signed)
Syncope- recurrent- pt declines further work up

## 2018-09-06 NOTE — Telephone Encounter (Signed)
Sent referral with stamp

## 2018-09-11 ENCOUNTER — Telehealth: Payer: Self-pay | Admitting: Cardiology

## 2018-09-11 NOTE — Telephone Encounter (Signed)
Spoke with pt wife, Aware of dr crenshaw's recommendations.  

## 2018-09-11 NOTE — Telephone Encounter (Signed)
Spoke with pt's wife and last week pt's Eliquis was stopped and was started on ASA 81 mg and pt had nose bleed this am and had terrible time  getting it stopped Encouraged to try a nasal saline for moisture Per wife have purchased a humidifier Pt held ASA Will forward to Dr Stanford Breed for review and recommendations ./cy

## 2018-09-11 NOTE — Telephone Encounter (Signed)
New Message   Pt c/o medication issue:  1. Name of Medication: Aspirin  2. How are you currently taking this medication (dosage and times per day)? 81 mg 1x daily   3. Are you having a reaction (difficulty breathing--STAT)? No  4. What is your medication issue? This morning the patient had a bad nose bleed and they had a difficult time getting it to stop. Pts wife said they are discontinuing the Asprin Please call

## 2018-09-11 NOTE — Telephone Encounter (Signed)
Would continue asa 81 mg daily unless recurrent nose bleeds Kirk Ruths

## 2018-09-26 ENCOUNTER — Other Ambulatory Visit: Payer: Self-pay | Admitting: Cardiology

## 2018-11-02 ENCOUNTER — Ambulatory Visit: Payer: PPO | Admitting: Family Medicine

## 2018-11-14 NOTE — Progress Notes (Signed)
Virtual Visit via Telephone Note The purpose of this virtual visit is to provide medical care while limiting exposure to the novel coronavirus.    Consent was obtained for phone visit:  Yes Answered questions that patient had about telehealth interaction:  Yes I discussed the limitations, risks, security and privacy concerns of performing an evaluation and management service by telephone. I also discussed with the patient that there may be a patient responsible charge related to this service. The patient expressed understanding and agreed to proceed.  Pt location: Home Physician Location: Home Name of referring provider:  Mosie Lukes, MD I connected with .[name] at patients initiation/request on 11/15/2018 at 10:30 AM EDT by telephone and verified that I am speaking with the correct person using two identifiers.  Pt MRN:  [id number] Pt DOB:  [birth year]   History of Present Illness:  Justin [name]. He is an 85 year old man with paroxysmal atrial fibrillation, orthostatic hypotension, hypertension, hyperlipidemia, oral cancer, diastolic CHF and coronary artery disease who follows up for cognitive impairment and questionable Parkinson's disease.  He is accompanied on the phone with his wife.  UPDATE:  In November, carbidopa levodopa was discontinued to see if his gait significantly declined.  He was admitted to the hospital in January for weakness and confusion and was treated for hyponatremia and UTI.  He was admitted to the hospital again in February for syncope. CTA of head and neck was negative for intracranial and extracranial large vessel occlusion or stenosis.  Syncope thought to be secondary to orthostatic hypotension oro severe aortic stenosis.  Eliquis was discontinued due to falls.  No further syncope.  His walking has gradually gotten worse.  He relies on the 4 wheel walker.  He has significant anxiety about falling.  Sometimes he becomes confused, usually after just  waking up from a nap.  Sometimes he thinks events from the past are current.  One time, he thought somebody was bringing him beer.  It usually lasts a few minutes.  His wife thinks his memory has gotten slightly worse.  HISTORY: He has had problems with balance for 6 years. He loses balance easily if his eyes are closed or if he stands in one place for several minutes. He cannot stand on one foot. He also notes trouble walking down hills as well as walking up stairs. When he walks, he will sometimes stumble. He also has chronic problems with his left foot though, such as bunions. He denies any neck pain or lower back pain radiating down the legs. He denies any sensation of numbness and tingling. He denies focal weakness but occasionally he reports generalized weakness where his legs give out. He denies any bowel or bladder dysfunction. When he closes his eyes, he has a sensation of sway, but no actual vertigo or fullness in his head. He does have occasional tinnitus and wears hearing aids. He has not had any sensation of passing out.   To help with gait, he was started on a trial of carbidopa-levodopa which appeared to somewhat improve his gait.  He was taking carbidopa-levodopa 25/100 mg 3 times daily.  1-1/2 tablets 3 times daily caused dizziness.  In November, carbidopa levodopa was discontinued to see if his gait significantly declined.  I saw him in 2014 and he underwent a neuropathy workup. NCV-EMG was normal. 2 hour glucose tolerance test was normal. B12 was 542, methylmalonic acid 0.11, Sed Rate 12, folate over 24.8, RPR nonreactive, SPEP and IFE negative, ANA  negative. Recent Hgb A1c wasa 5.2 and TSH was 4.34.  For a couple of years he had had episodes of confusion where he cannot process or collect his thoughts. It may last up to an hour and occur once a week. He may forget medications or names of people. Each time he has a surgery, he becomes confused and his memory gets worse. This  occurred both after knee surgery and dilatation of bladder neck. In evaluating memory, B12 from 05/25/16 was 599. Repeat testing on 11/25/16 was 741. TSH was 7.432. He was previously on Aricept which was discontinued due to tiredness and dizziness.  He had had imaging of the head for dizziness. CT of head from 09/13/14 was personally reviewed and revealed atrophy and chronic small vessel ischemic changes but nothing acute. Repeat CT of head from 03/23/16 was unchanged.    Observations/Objective:   There were no vitals taken for this visit. No acute distress.  Speech fluent and not dysarthric.  Language intact.  Assessment and Plan:   1.  cognitive impairment, likely mild Alzheimer's dementia 2.  Unsteady gait.  Multifactorial.  1.  He would like to restart carbidopa levodopa.  We will titrate up very slowly (by 1/2 tablet per week) to goal of 1 tablet three times daily . 2.  Follow up in 6 months   Follow Up Instructions:    -I discussed the assessment and treatment plan with the patient. The patient was provided an opportunity to ask questions and all were answered. The patient agreed with the plan and demonstrated an understanding of the instructions.   The patient was advised to call back or seek an in-person evaluation if the symptoms worsen or if the condition fails to improve as anticipated.    Total Time spent in visit with the patient was:  16 minutes   Dudley Major, DO

## 2018-11-15 ENCOUNTER — Telehealth (INDEPENDENT_AMBULATORY_CARE_PROVIDER_SITE_OTHER): Payer: PPO | Admitting: Neurology

## 2018-11-15 ENCOUNTER — Other Ambulatory Visit: Payer: Self-pay

## 2018-11-15 ENCOUNTER — Encounter: Payer: Self-pay | Admitting: Neurology

## 2018-11-15 VITALS — BP 124/79 | HR 66 | Ht 65.0 in | Wt 148.0 lb

## 2018-11-15 DIAGNOSIS — R2681 Unsteadiness on feet: Secondary | ICD-10-CM

## 2018-11-15 DIAGNOSIS — F0390 Unspecified dementia without behavioral disturbance: Secondary | ICD-10-CM | POA: Diagnosis not present

## 2018-11-28 DIAGNOSIS — C4442 Squamous cell carcinoma of skin of scalp and neck: Secondary | ICD-10-CM | POA: Diagnosis not present

## 2018-11-28 DIAGNOSIS — L089 Local infection of the skin and subcutaneous tissue, unspecified: Secondary | ICD-10-CM | POA: Diagnosis not present

## 2018-11-30 DIAGNOSIS — N401 Enlarged prostate with lower urinary tract symptoms: Secondary | ICD-10-CM | POA: Diagnosis not present

## 2018-11-30 DIAGNOSIS — R3912 Poor urinary stream: Secondary | ICD-10-CM | POA: Diagnosis not present

## 2018-12-11 ENCOUNTER — Other Ambulatory Visit: Payer: Self-pay | Admitting: Neurology

## 2018-12-13 ENCOUNTER — Ambulatory Visit: Payer: PPO | Admitting: Cardiology

## 2019-01-15 DIAGNOSIS — M25512 Pain in left shoulder: Secondary | ICD-10-CM | POA: Diagnosis not present

## 2019-01-22 ENCOUNTER — Other Ambulatory Visit: Payer: Self-pay | Admitting: Cardiology

## 2019-01-22 NOTE — Telephone Encounter (Signed)
Please advise if ok to refill Levothyroxine 75 mcg qd.

## 2019-01-23 DIAGNOSIS — L989 Disorder of the skin and subcutaneous tissue, unspecified: Secondary | ICD-10-CM | POA: Diagnosis not present

## 2019-01-23 DIAGNOSIS — C4442 Squamous cell carcinoma of skin of scalp and neck: Secondary | ICD-10-CM | POA: Diagnosis not present

## 2019-01-25 MED ORDER — LEVOTHYROXINE SODIUM 75 MCG PO TABS: ORAL_TABLET | ORAL | 3 refills | Status: AC

## 2019-01-25 NOTE — Telephone Encounter (Signed)
Refill sent to the pharmacy electronically.  

## 2019-01-25 NOTE — Addendum Note (Signed)
Addended by: Cristopher Estimable on: 01/25/2019 10:03 AM   Modules accepted: Orders

## 2019-02-02 DIAGNOSIS — Z4802 Encounter for removal of sutures: Secondary | ICD-10-CM | POA: Diagnosis not present

## 2019-02-12 DIAGNOSIS — M25512 Pain in left shoulder: Secondary | ICD-10-CM | POA: Diagnosis not present

## 2019-02-26 ENCOUNTER — Other Ambulatory Visit: Payer: Self-pay | Admitting: Cardiology

## 2019-02-26 MED ORDER — AMIODARONE HCL 200 MG PO TABS: 200.0000 mg | ORAL_TABLET | Freq: Every day | ORAL | 0 refills | Status: DC

## 2019-02-26 NOTE — Telephone Encounter (Signed)
°*  STAT* If patient is at the pharmacy, call can be transferred to refill team.   1. Which medications need to be refilled? (please list name of each medication and dose if known) Amiodarone hcl 200mg   2. Which pharmacy/location (including street and city if local pharmacy) is medication to be sent to?[facility]  3. Do they need a 30 day or 90 day supply? [place]

## 2019-02-26 NOTE — Telephone Encounter (Signed)
Requested Prescriptions   Signed Prescriptions Disp Refills  . amiodarone (PACERONE) 200 MG tablet 90 tablet 0    Sig: Take 1 tablet (200 mg total) by mouth daily.    Authorizing Provider: Lelon Perla    Ordering User: Raelene Bott, Aliene Tamura L

## 2019-03-17 NOTE — Progress Notes (Signed)
HPI: FU CAD s/p CABG (2001) and atrial fibrillation. ABIs 7/17 showed vessels noncompressible.Had NSTEMI 12/18; cath showed occluded native vessels, 95 SVG to OM1 (PCI with DES), occluded SVG to OM2 and 3, patent SVG to RCA and patent LIMA to LAD. Also on amiodarone for PAF. Metoprolol and cardizem DCed duringpreviousov due to bradycardia.Seen with syncope 7/19; concern for post termination pauses.Monitor7/19 showedno significant arrhythmia.Echo 12/19 showed EF 40-45, moderate LVH, grade 1 DD, moderate to severe stenosis with mean gradient 30 mmHg, AVA 0.5 cm2, mild MR, mild to moderate LAE, mild RV dysfunction. Since last seen,  he denies dyspnea, chest pain, palpitations or recurrent syncope.  Occasional mild pedal edema.  Current Outpatient Medications  Medication Sig Dispense Refill  . acetaminophen (TYLENOL) 500 MG tablet Take 500 mg by mouth daily as needed for moderate pain.    [name] amiodarone (PACERONE) 200 MG tablet Take 1 tablet (200 mg total) by mouth daily. 90 tablet 0  . amLODipine (NORVASC) 2.5 MG tablet TAKE 1 TABLET BY MOUTH DAILY 180 tablet 1  . aspirin EC 81 MG tablet Take 1 tablet (81 mg total) by mouth daily. 90 tablet 3  . beta carotene w/minerals (OCUVITE) tablet Take 1 tablet by mouth daily after breakfast.     . carbidopa-levodopa (SINEMET IR) 25-100 MG tablet Take 1 tablet by mouth 3 (three) times daily. 90 tablet 3  . Carboxymethylcellulose Sodium (THERATEARS OP) Place 1 drop into both eyes daily as needed (dry eyes).     . clopidogrel (PLAVIX) 75 MG tablet Take 1 tablet (75 mg total) by mouth daily with breakfast. (Patient taking differently: Take 75 mg by mouth daily after breakfast. ) 90 tablet 3  . docusate sodium (COLACE) 100 MG capsule Take 100 mg by mouth daily after breakfast.     . ferrous sulfate 325 (65 FE) MG tablet Take 325 mg by mouth daily after supper.     . finasteride (PROSCAR) 5 MG tablet Take 5 mg by mouth daily after breakfast.     .  levothyroxine (SYNTHROID) 75 MCG tablet TAKE 1 TABLET BY MOUTH DAILY BEFORE BREAKFAST 90 tablet 3  . lovastatin (MEVACOR) 20 MG tablet TAKE 1 TABLET DAILY AT 6PM 90 tablet 2  . meclizine (ANTIVERT) 25 MG tablet Take 1 tablet (25 mg total) by mouth 3 (three) times daily as needed for dizziness. 30 tablet 0  . Multiple Vitamin (MULTIVITAMIN WITH MINERALS) TABS tablet Take 1 tablet by mouth daily after breakfast.     . nitroGLYCERIN (NITROSTAT) 0.4 MG SL tablet Place 1 tablet (0.4 mg total) under the tongue every 5 (five) minutes as needed for chest pain. 25 tablet 12  . Probiotic Product (PROBIOTIC DAILY PO) Take 1 capsule by mouth daily after breakfast.      No current facility-administered medications for this visit.      Past Medical History:  Diagnosis Date  . Basal cell carcinoma   . BPH (benign prostatic hypertrophy)   . CAD (coronary artery disease) 1991   a. H/o MI w/ PTCA;  b. s/p CABG by Dr Roxan Hockey w/ LIMA-LAD, SVG-OM1-OM3, SVG-D1, SVG-PDA  . CHF (congestive heart failure) ([place])   . Complication of anesthesia    Wife reports history of confusion after last surgery in 10/2016-lasted a few days  . HTN (hypertension)   . Hyperlipidemia   . Internal hemorrhoids   . Moderate aortic stenosis    a. 08/2014 Echo: EF 55-60%, no rwma, Gr 1 DD, mod AS,  mildly dil asc Ao, mild MR, sev dil LA.  . NSTEMI (non-ST elevated myocardial infarction) ([place]) 05/2017    PCI with DES to SVG-OM1. SVG-OM3 occluded, LIMA-LAD and SVG-RCA patent  . PAF (paroxysmal atrial fibrillation) ([place])    a. Dx 08/2014;  b. CHA2DS2VASc = 4 - decision made to withold [place] 2/2 unsteady gait and syncope.  . Sigmoid diverticulitis   . Syncope    a. 08/2014 - ? orthostatic    Past Surgical History:  Procedure Laterality Date  . CARDIAC CATHETERIZATION    . COLONOSCOPY    . CORONARY ARTERY BYPASS GRAFT  2000   Dr Roxan Hockey  LIMA-LAD, SVG-OM1-OM3, SVG-D1, SVG-PDA.   [name] CORONARY STENT INTERVENTION N/A 06/13/2017    Procedure: CORONARY STENT INTERVENTION;  Surgeon: Lorretta Harp, MD;  Location: [facility];  Service: Cardiovascular;  Laterality: N/A;  . CYSTOSCOPY N/A 12/07/2016   Procedure: CYSTOSCOPY;  Surgeon: Cleon Gustin, MD;  Location: [facility];  Service: Urology;  Laterality: N/A;  NEEDS 30 MIN TOTAL FOR SURGERY  . CYSTOSCOPY WITH URETHRAL DILATATION N/A 02/21/2017   Procedure: DILATATION OF BLADDER NECK WITH INJECTION OF MITOMYCIN C;  Surgeon: Cleon Gustin, MD;  Location: [facility];  Service: Urology;  Laterality: N/A;  . EYE SURGERY     bil cataract  . HIP ARTHROPLASTY Left 10/14/2015   Procedure: LEFT HIP HEMI ARTHROPLASTY;  Surgeon: [name], MD;  Location: [facility];  Service: Orthopedics;  Laterality: Left;  . LEFT HEART CATH AND CORS/GRAFTS ANGIOGRAPHY N/A 06/13/2017   Procedure: LEFT HEART CATH AND CORS/GRAFTS ANGIOGRAPHY;  Surgeon: Lorretta Harp, MD;  Location: [facility];  Service: Cardiovascular;  Laterality: N/A;  . SHOULDER SURGERY     x2  . TONSILLECTOMY    . TRANSTHORACIC ECHOCARDIOGRAM  10-06-2016   dr Stanford Breed   mild LVH, ef 40-45%, diffuse hypokinesis/ moderate AV calcification leaflets and annulus,  severe thickened leaflets, mod.-sev. aortic stenosis (Valva area 0.61cm^2, mean grandient 48mmHg, peak gradiant 65mmHg)/  moderate thickened , mild calcified MV leaflets without stenosis, moderate MR/  severe LAE/ trivial PR and TR  . TRANSURETHRAL RESECTION OF BLADDER NECK N/A 12/07/2016   Procedure: TRANSURETHRAL RESECTION OF BLADDER NECK;  Surgeon: Cleon Gustin, MD;  Location: [facility];  Service: Urology;  Laterality: N/A;  . XI ROBOTIC ASSISTED SIMPLE PROSTATECTOMY N/A 11/04/2016   Procedure: XI ROBOTIC ASSISTED SIMPLE PROSTATECTOMY;  Surgeon: Cleon Gustin, MD;  Location: [facility];  Service: Urology;  Laterality: N/A;    Social History   Socioeconomic History  . Marital status: Married    Spouse name: [place]  . Number of children: 2  .  Years of education: Not on file  . Highest education level: Not on file  Occupational History  . 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 Financial resource strain: Not very hard  . Food insecurity    Worry: Never true    Inability: Never true  . Transportation needs    Medical: No    Non-medical: No  Tobacco Use  . Smoking status: Former Smoker    Packs/day: 0.50    Years: 15.00    Pack years: 7.50    Quit date: 06/29/1971    Years since quitting: 47.7  . Smokeless tobacco: Never Used  Substance and Sexual Activity  . Alcohol use: Yes    Alcohol/week: 1.0 - 2.0 standard drinks    Types: 1 - 2 Glasses of wine per week  Comment: alternates wine and liquor. 1/2 pint liquor/week  . Drug use: No  . 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 Physical activity    Days per week: 0 days    Minutes per session: 0 min  . Stress: Not at all  Relationships  . Social Herbalist on phone: Not on file    Gets together: Not on file    Attends religious service: Not on file    Active member of club or organization: Not on file    Attends meetings of clubs or organizations: Not on file    Relationship status: Not on file  . Intimate partner violence    Fear of current or ex partner: Not on file    Emotionally abused: Not on file    Physically abused: Not on file    Forced sexual activity: Not on file  Other Topics Concern  . Not on file  Social History Narrative   Pt and wife have an Independent home at [facility], wants to move to an apartment soon.    Family History  Problem Relation Age of Onset  . Coronary artery disease Father   . Hyperlipidemia Father   . Heart attack Father   . Alcohol abuse Father   . Bipolar disorder Mother   . Heart disease Mother   . Alcohol abuse Brother   . Cancer Brother   . Cancer Maternal Grandfather        oral  cancer, chew tobacco  . Cancer Sister   . Glaucoma Sister   . Alcohol abuse Other     ROS: no fevers or chills, productive cough, hemoptysis, dysphasia, odynophagia, melena, hematochezia, dysuria, hematuria, rash, seizure activity, orthopnea, PND, pedal edema, claudication. Remaining systems are negative.  Physical Exam: Well-developed well-nourished in no acute distress.  Skin is warm and dry.  HEENT is normal.  Neck is supple.  Chest is clear to auscultation with normal expansion.  Cardiovascular exam is regular rate and rhythm. 3/6 systolic murmur; S2 is preserved. Abdominal exam nontender or distended. No masses palpated. Extremities show trace edema. neuro grossly intact  A/P  1 paroxysmal atrial fibrillation-patient remains in sinus rhythm.  We discontinued his apixaban previously because of recurrent dizzy spells/falling.  I feel the risk outweighs benefit.  Continue amiodarone at present dose.  Continue amiodarone.  Check TSH, liver functions and chest x-ray.  2 moderate to severe aortic stenosis-we will plan to repeat echocardiogram in December.  May be a candidate for TAVR in the future though not clear he would be agreeable.  3 coronary artery disease-continue ASA and statin. DC plavix.  4 hypertension-blood pressure control.  Continue present medications.  5 hyperlipidemia-continue statin.  6 peripheral vascular disease-continue Plavix and statin.  No recent claudication.  7 history of syncope-no recent episodes.  Declined further evaluation at time of office visit March 2020.  Kirk Ruths, MD

## 2019-03-21 ENCOUNTER — Ambulatory Visit (INDEPENDENT_AMBULATORY_CARE_PROVIDER_SITE_OTHER): Payer: PPO | Admitting: Cardiology

## 2019-03-21 ENCOUNTER — Ambulatory Visit (HOSPITAL_BASED_OUTPATIENT_CLINIC_OR_DEPARTMENT_OTHER)
Admission: RE | Admit: 2019-03-21 | Discharge: 2019-03-21 | Disposition: A | Discharge status: Home / Self Care | Payer: PPO | Source: Ambulatory Visit | Attending: Cardiology | Admitting: Cardiology

## 2019-03-21 ENCOUNTER — Encounter: Payer: Self-pay | Admitting: Cardiology

## 2019-03-21 ENCOUNTER — Other Ambulatory Visit: Payer: Self-pay

## 2019-03-21 VITALS — BP 124/62 | HR 68 | Ht 65.0 in | Wt 143.0 lb

## 2019-03-21 DIAGNOSIS — Z9861 Coronary angioplasty status: Secondary | ICD-10-CM | POA: Diagnosis not present

## 2019-03-21 DIAGNOSIS — I480 Paroxysmal atrial fibrillation: Secondary | ICD-10-CM | POA: Diagnosis not present

## 2019-03-21 DIAGNOSIS — M4854XA Collapsed vertebra, not elsewhere classified, thoracic region, initial encounter for fracture: Secondary | ICD-10-CM | POA: Diagnosis not present

## 2019-03-21 DIAGNOSIS — I350 Nonrheumatic aortic (valve) stenosis: Secondary | ICD-10-CM | POA: Diagnosis not present

## 2019-03-21 DIAGNOSIS — I517 Cardiomegaly: Secondary | ICD-10-CM | POA: Diagnosis not present

## 2019-03-21 DIAGNOSIS — Z951 Presence of aortocoronary bypass graft: Secondary | ICD-10-CM | POA: Diagnosis not present

## 2019-03-21 DIAGNOSIS — I2510 Atherosclerotic heart disease of native coronary artery without angina pectoris: Secondary | ICD-10-CM

## 2019-03-21 DIAGNOSIS — R55 Syncope and collapse: Secondary | ICD-10-CM | POA: Diagnosis not present

## 2019-03-21 DIAGNOSIS — M8588 Other specified disorders of bone density and structure, other site: Secondary | ICD-10-CM | POA: Diagnosis not present

## 2019-03-21 DIAGNOSIS — M47814 Spondylosis without myelopathy or radiculopathy, thoracic region: Secondary | ICD-10-CM | POA: Diagnosis not present

## 2019-03-21 NOTE — Patient Instructions (Signed)
Medication Instructions:  STOP PLAVIX  If you need a refill on your cardiac medications before your next appointment, please call your pharmacy.   Lab work: Your physician recommends that you HAVE LAB WORK TODAY  If you have labs (blood work) drawn today and your tests are completely normal, you will receive your results only by: [name] MyChart Message (if you have MyChart) OR . A paper copy in the mail If you have any lab test that is abnormal or we need to change your treatment, we will call you to review the results.  Testing/Procedures: A chest x-ray takes a picture of the organs and structures inside the chest, including the heart, lungs, and blood vessels. This test can show several things, including, whether the heart is enlarges; whether fluid is building up in the lungs; and whether pacemaker / defibrillator leads are still in place. AT [facility]  Your physician has requested that you have an echocardiogram. Echocardiography is a painless test that uses sound waves to create images of your heart. It provides your doctor with information about the size and shape of your heart and how well your heart's chambers and valves are working. This procedure takes approximately one hour. There are no restrictions for this procedure.AT [facility] IN DECEMBER    Follow-Up: At [facility], you and your health needs are our priority.  As part of our continuing mission to provide you with exceptional heart care, we have created designated Provider Care Teams.  These Care Teams include your primary Cardiologist (physician) and Advanced Practice Providers (APPs -  Physician Assistants and Nurse Practitioners) who all work together to provide you with the care you need, when you need it. You will need a follow up appointment in 6 months.  Please call our office 2 months in advance to schedule this appointment.  You may see Kirk Ruths, MD or one of the following Advanced Practice  Providers on your designated Care Team:   Kerin Ransom, PA-C Roby Lofts, [place] . Sande Rives, PA-C

## 2019-03-22 LAB — TSH: TSH: 3.17 u[IU]/mL (ref 0.450–4.500)

## 2019-03-22 LAB — HEPATIC FUNCTION PANEL
ALT: 21 IU/L (ref 0–44)
AST: 24 IU/L (ref 0–40)
Albumin: 4.1 g/dL (ref 3.6–4.6)
Alkaline Phosphatase: 80 IU/L (ref 39–117)
Bilirubin Total: 0.5 mg/dL (ref 0.0–1.2)
Bilirubin, Direct: 0.18 mg/dL (ref 0.00–0.40)
Total Protein: 6.4 g/dL (ref 6.0–8.5)

## 2019-03-26 ENCOUNTER — Other Ambulatory Visit (HOSPITAL_BASED_OUTPATIENT_CLINIC_OR_DEPARTMENT_OTHER): Payer: PPO

## 2019-04-03 DIAGNOSIS — C44519 Basal cell carcinoma of skin of other part of trunk: Secondary | ICD-10-CM | POA: Diagnosis not present

## 2019-04-03 DIAGNOSIS — Z85828 Personal history of other malignant neoplasm of skin: Secondary | ICD-10-CM | POA: Diagnosis not present

## 2019-04-03 DIAGNOSIS — C44712 Basal cell carcinoma of skin of right lower limb, including hip: Secondary | ICD-10-CM | POA: Diagnosis not present

## 2019-04-03 DIAGNOSIS — D692 Other nonthrombocytopenic purpura: Secondary | ICD-10-CM | POA: Diagnosis not present

## 2019-04-03 DIAGNOSIS — L089 Local infection of the skin and subcutaneous tissue, unspecified: Secondary | ICD-10-CM | POA: Diagnosis not present

## 2019-04-03 DIAGNOSIS — Z08 Encounter for follow-up examination after completed treatment for malignant neoplasm: Secondary | ICD-10-CM | POA: Diagnosis not present

## 2019-04-16 ENCOUNTER — Other Ambulatory Visit: Payer: Self-pay | Admitting: Neurology

## 2019-04-17 NOTE — Telephone Encounter (Signed)
Requested Prescriptions   Pending Prescriptions Disp Refills  . carbidopa-levodopa (SINEMET IR) 25-100 MG tablet [Pharmacy Med Name: CARBIDOPA-LEVODOPA 25-100 MG TAB] 90 tablet 3    Sig: TAKE ONE (1) TABLET BY MOUTH 3 TIMES DAILY   Rx last filled:12/11/18 #90 3 REFILLS  Pt last seen: 11/15/18   Follow up appt scheduled:05/21/19

## 2019-05-01 DIAGNOSIS — C44712 Basal cell carcinoma of skin of right lower limb, including hip: Secondary | ICD-10-CM | POA: Diagnosis not present

## 2019-05-18 ENCOUNTER — Telehealth: Payer: Self-pay

## 2019-05-18 NOTE — Telephone Encounter (Signed)
Copied from [place] [id number]. Topic: General - Other >> May 18, 2019 10:39 AM Virl Axe D wrote: Reason for CRM: Pt's wife stated Pt had his flu shot at [facility] 3 weeks ago. She keeps getting messages that she needs to have one. Please advise.

## 2019-05-18 NOTE — Telephone Encounter (Signed)
Copied from [place] [phone]. Topic: General - Other >> May 18, 2019 10:39 AM Virl Axe D wrote: Reason for CRM: Pt's wife stated Pt had his flu shot at [facility] 3 weeks ago. She keeps getting messages that she needs to have one. Please advise.

## 2019-05-20 NOTE — Progress Notes (Signed)
Virtual Visit via Telephone Note The purpose of this virtual visit is to provide medical care while limiting exposure to the novel coronavirus.    Consent was obtained for phone visit:  Yes.   Answered questions that patient had about telehealth interaction:  Yes.   I discussed the limitations, risks, security and privacy concerns of performing an evaluation and management service by telephone. I also discussed with the patient that there may be a patient responsible charge related to this service. The patient expressed understanding and agreed to proceed.  Pt location: Home Physician Location: office Name of referring provider:  Mosie Lukes, MD I connected with .Justin [name] at patients initiation/request on 05/21/2019 at 10:50 AM EST by telephone and verified that I am speaking with the correct person using two identifiers.  Pt MRN:  [id number] Pt DOB:  [birth year]   History of Present Illness:  Justin [name] is an 85 year old man with paroxysmal atrial fibrillation, orthostatic hypotension, hypertension, hyperlipidemia, oral cancer, diastolic CHF and coronary artery disease who follows up for cognitive impairment and questionable Parkinson's disease.  He is accompanied on the phone with his wife.  UPDATE: Restarted carbidopa-levodopa in May.  No improvement in walking.  Uses walker at all times.  Yesterday, he had a dizzy spell upon getting out of bed and felt lightheaded.  He feels better today.  Memory has been stable.  It seems to be a little bit worse later in the day.    HISTORY: He has had problems with balance for 6 years. He loses balance easily if his eyes are closed or if he stands in one place for several minutes. He cannot stand on one foot. He also notes trouble walking down hills as well as walking up stairs. When he walks, he will sometimes stumble. He also has chronic problems with his left foot though, such as bunions. He denies any neck pain or lower back  pain radiating down the legs. He denies any sensation of numbness and tingling. He denies focal weakness but occasionally he reports generalized weakness where his legs give out. He denies any bowel or bladder dysfunction. When he closes his eyes, he has a sensation of sway, but no actual vertigo or fullness in his head. He does have occasional tinnitus and wears hearing aids. He has not had any sensation of passing out.To help with gait, he was started on a trial of carbidopa-levodopa which appeared to somewhat improve his gait.  He was taking carbidopa-levodopa 25/100 mg 3 times daily. 1-1/2 tablets 3 times daily caused dizziness.  In November, carbidopa levodopa was discontinued to see if his gait significantly declined.  He had had imaging of the head for dizziness. CT of head from 09/13/14 was personally reviewed and revealed atrophy and chronic small vessel ischemic changes but nothing acute. Repeat CT of head from 03/23/16 was unchanged.  I saw him in 2014 and he underwent a neuropathy workup. NCV-EMG was normal. 2 hour glucose tolerance test was normal. B12 was 542, methylmalonic acid 0.11, Sed Rate 12, folate over 24.8, RPR nonreactive, SPEP and IFE negative, ANA negative. Recent Hgb A1c wasa 5.2 and TSH was 4.34.  For a couple of years he had had episodes of confusion where he cannot process or collect his thoughts. It may last up to an hour and occur once a week. He may forget medications or names of people. Each time he has a surgery, he becomes confused and his memory gets worse. This  occurred both after knee surgery and dilatation of bladder neck. In evaluating memory, B12 from 05/25/16 was 599. Repeat testing on 11/25/16 was 741. TSH was 7.432. He was previously on Aricept which was discontinued due to tiredness and dizziness.  He was admitted to the hospital in January 2020 for weakness and confusion and was treated for hyponatremia and UTI.  He was admitted to the  hospital again the following month for syncope. CTA of head and neck was negative for intracranial and extracranial large vessel occlusion or stenosis.  Syncope thought to be secondary to orthostatic hypotension oro severe aortic stenosis.  Eliquis was discontinued due to falls.  No further syncope.    Observations/Objective:   No acute distress.  Alert and oriented.  Speech fluent and not dysarthric.  Language intact.   Assessment and Plan:   1.  cognitive impairment/mild dementia 2.  Unsteady gait.  Multifactorial.  Definitely consider Parkinson's disease.  1.  Increase carbidopa-levodopa 25/100mg  to 1.5 tablets three times daily 2.  Use walker at all times 3.  Follow up in 5 months.   Follow Up Instructions:    -I discussed the assessment and treatment plan with the patient. The patient was provided an opportunity to ask questions and all were answered. The patient agreed with the plan and demonstrated an understanding of the instructions.   The patient was advised to call back or seek an in-person evaluation if the symptoms worsen or if the condition fails to improve as anticipated.    Total Time spent in visit with the patient was:  12 minutes  Dudley Major, DO

## 2019-05-21 ENCOUNTER — Other Ambulatory Visit: Payer: Self-pay | Admitting: Cardiology

## 2019-05-21 ENCOUNTER — Encounter: Payer: Self-pay | Admitting: Neurology

## 2019-05-21 ENCOUNTER — Other Ambulatory Visit: Payer: Self-pay

## 2019-05-21 ENCOUNTER — Telehealth (INDEPENDENT_AMBULATORY_CARE_PROVIDER_SITE_OTHER): Payer: PPO | Admitting: Neurology

## 2019-05-21 VITALS — Ht 65.0 in | Wt 150.0 lb

## 2019-05-21 DIAGNOSIS — F0390 Unspecified dementia without behavioral disturbance: Secondary | ICD-10-CM | POA: Diagnosis not present

## 2019-05-21 DIAGNOSIS — G20 Parkinson's disease: Secondary | ICD-10-CM

## 2019-05-21 MED ORDER — CARBIDOPA-LEVODOPA 25-100 MG PO TABS: 1.5000 | ORAL_TABLET | Freq: Three times a day (TID) | ORAL | 4 refills | Status: DC

## 2019-06-04 ENCOUNTER — Other Ambulatory Visit: Payer: Self-pay

## 2019-06-04 ENCOUNTER — Ambulatory Visit (HOSPITAL_BASED_OUTPATIENT_CLINIC_OR_DEPARTMENT_OTHER)
Admission: RE | Admit: 2019-06-04 | Discharge: 2019-06-04 | Disposition: A | Discharge status: Home / Self Care | Payer: PPO | Source: Ambulatory Visit | Attending: Cardiology | Admitting: Cardiology

## 2019-06-04 DIAGNOSIS — I350 Nonrheumatic aortic (valve) stenosis: Secondary | ICD-10-CM | POA: Insufficient documentation

## 2019-06-04 NOTE — Progress Notes (Signed)
  Echocardiogram 2D Echocardiogram has been performed.     Cardell Peach 06/04/2019, 10:15 AM

## 2019-06-11 ENCOUNTER — Telehealth: Payer: Self-pay | Admitting: Neurology

## 2019-06-11 MED ORDER — CARBIDOPA-LEVODOPA 25-100 MG PO TABS: 1.5000 | ORAL_TABLET | Freq: Three times a day (TID) | ORAL | 4 refills | Status: AC

## 2019-06-11 NOTE — Telephone Encounter (Signed)
Requested Prescriptions   Pending Prescriptions Disp Refills  . carbidopa-levodopa (SINEMET IR) 25-100 MG tablet 135 tablet 4    Sig: Take 1.5 tablets by mouth 3 (three) times daily.   Rx last filled:05/21/19 #135 4 refills  Pt last seen:05/21/19  Follow up appt scheduled:10/22/2019

## 2019-06-11 NOTE — Telephone Encounter (Signed)
Patient's wife called needing to have a new Rx faxed to [facility] on his Carbidopa Levodopa 1 1/2. Insurance will not cover because he ran out faster than he should have. Thank you

## 2019-06-27 ENCOUNTER — Other Ambulatory Visit: Payer: Self-pay | Admitting: Cardiology

## 2019-07-09 DIAGNOSIS — H26493 Other secondary cataract, bilateral: Secondary | ICD-10-CM | POA: Diagnosis not present

## 2019-07-09 DIAGNOSIS — H353132 Nonexudative age-related macular degeneration, bilateral, intermediate dry stage: Secondary | ICD-10-CM | POA: Diagnosis not present

## 2019-07-09 DIAGNOSIS — H35033 Hypertensive retinopathy, bilateral: Secondary | ICD-10-CM | POA: Diagnosis not present

## 2019-07-09 DIAGNOSIS — Z961 Presence of intraocular lens: Secondary | ICD-10-CM | POA: Diagnosis not present

## 2019-07-12 DIAGNOSIS — S50811A Abrasion of right forearm, initial encounter: Secondary | ICD-10-CM | POA: Diagnosis not present

## 2019-07-13 DIAGNOSIS — Z5189 Encounter for other specified aftercare: Secondary | ICD-10-CM | POA: Diagnosis not present

## 2019-07-13 DIAGNOSIS — S51811S Laceration without foreign body of right forearm, sequela: Secondary | ICD-10-CM | POA: Diagnosis not present

## 2019-07-16 DIAGNOSIS — Z5189 Encounter for other specified aftercare: Secondary | ICD-10-CM | POA: Diagnosis not present

## 2019-07-16 DIAGNOSIS — S51811S Laceration without foreign body of right forearm, sequela: Secondary | ICD-10-CM | POA: Diagnosis not present

## 2019-07-17 DIAGNOSIS — Z5189 Encounter for other specified aftercare: Secondary | ICD-10-CM | POA: Diagnosis not present

## 2019-07-18 DIAGNOSIS — Z5189 Encounter for other specified aftercare: Secondary | ICD-10-CM | POA: Diagnosis not present

## 2019-07-19 DIAGNOSIS — Z5189 Encounter for other specified aftercare: Secondary | ICD-10-CM | POA: Diagnosis not present

## 2019-07-20 DIAGNOSIS — Z5189 Encounter for other specified aftercare: Secondary | ICD-10-CM | POA: Diagnosis not present

## 2019-07-30 DIAGNOSIS — Z5189 Encounter for other specified aftercare: Secondary | ICD-10-CM | POA: Diagnosis not present

## 2019-08-06 ENCOUNTER — Telehealth: Payer: Self-pay | Admitting: Family Medicine

## 2019-08-06 DIAGNOSIS — Z5189 Encounter for other specified aftercare: Secondary | ICD-10-CM | POA: Diagnosis not present

## 2019-08-06 DIAGNOSIS — S51811S Laceration without foreign body of right forearm, sequela: Secondary | ICD-10-CM | POA: Diagnosis not present

## 2019-08-06 NOTE — Telephone Encounter (Signed)
Spoke with Justin [name] regarding AWV. She stated that they had someone come to the home to complete the visit. Declined to schedule at this time. SF

## 2019-08-13 ENCOUNTER — Other Ambulatory Visit: Payer: Self-pay | Admitting: Cardiology

## 2019-08-15 DIAGNOSIS — L98491 Non-pressure chronic ulcer of skin of other sites limited to breakdown of skin: Secondary | ICD-10-CM | POA: Diagnosis not present

## 2019-08-17 DIAGNOSIS — Z5189 Encounter for other specified aftercare: Secondary | ICD-10-CM | POA: Diagnosis not present

## 2019-08-21 DIAGNOSIS — L570 Actinic keratosis: Secondary | ICD-10-CM | POA: Diagnosis not present

## 2019-08-21 DIAGNOSIS — Z85828 Personal history of other malignant neoplasm of skin: Secondary | ICD-10-CM | POA: Diagnosis not present

## 2019-08-21 DIAGNOSIS — L98491 Non-pressure chronic ulcer of skin of other sites limited to breakdown of skin: Secondary | ICD-10-CM | POA: Diagnosis not present

## 2019-08-24 DIAGNOSIS — Z5189 Encounter for other specified aftercare: Secondary | ICD-10-CM | POA: Diagnosis not present

## 2019-09-05 ENCOUNTER — Ambulatory Visit: Payer: PPO | Admitting: Cardiology

## 2019-09-11 NOTE — Progress Notes (Signed)
HPI: FU CAD s/p CABG (2001) and atrial fibrillation. ABIs 7/17 showed vessels noncompressible.Had NSTEMI 12/18; cath showed occluded native vessels, 95 SVG to OM1 (PCI with DES), occluded SVG to OM2 and 3, patent SVG to RCA and patent LIMA to LAD. Also on amiodarone for PAF. Metoprolol and cardizem DCed during previousov due to bradycardia. Seen with syncope 7/19; concern for post termination pauses.Monitor7/19 showedno significant arrhythmia. Echocardiogram December 2020 showed ejection fraction 40 to 45%, severe left ventricular hypertrophy, grade 1 diastolic dysfunction, biatrial enlargement, mild mitral regurgitation, severe aortic stenosis with mean gradient 26 mmHg and calculated valve area 0.6 cm. Since last seen,patient notes some increased dyspnea on exertion.  He also notes dyspnea with talking.  Minimal pedal edema.  No chest pain or syncope.  Current Outpatient Medications  Medication Sig Dispense Refill  . acetaminophen (TYLENOL) 500 MG tablet Take 500 mg by mouth daily as needed for moderate pain.    [name] amiodarone (PACERONE) 200 MG tablet TAKE ONE (1) TABLET BY MOUTH EVERY DAY 90 tablet 1  . amLODipine (NORVASC) 2.5 MG tablet TAKE 1 TABLET BY MOUTH DAILY 180 tablet 1  . aspirin EC 81 MG tablet Take 1 tablet (81 mg total) by mouth daily. 90 tablet 3  . beta carotene w/minerals (OCUVITE) tablet Take 1 tablet by mouth daily after breakfast.     . carbidopa-levodopa (SINEMET IR) 25-100 MG tablet Take 1.5 tablets by mouth 3 (three) times daily. 135 tablet 4  . Carboxymethylcellulose Sodium (THERATEARS OP) Place 1 drop into both eyes daily as needed (dry eyes).     [name] docusate sodium (COLACE) 100 MG capsule Take 100 mg by mouth daily after breakfast.     . ferrous sulfate 325 (65 FE) MG tablet Take 325 mg by mouth daily after supper.     . finasteride (PROSCAR) 5 MG tablet Take 5 mg by mouth daily after breakfast.     . levothyroxine (SYNTHROID) 75 MCG tablet TAKE 1 TABLET BY  MOUTH DAILY BEFORE BREAKFAST 90 tablet 3  . lovastatin (MEVACOR) 20 MG tablet TAKE 1 TABLET DAILY AT 6PM 90 tablet 2  . meclizine (ANTIVERT) 25 MG tablet Take 1 tablet (25 mg total) by mouth 3 (three) times daily as needed for dizziness. 30 tablet 0  . Multiple Vitamin (MULTIVITAMIN WITH MINERALS) TABS tablet Take 1 tablet by mouth daily after breakfast.     . nitroGLYCERIN (NITROSTAT) 0.4 MG SL tablet Place 1 tablet (0.4 mg total) under the tongue every 5 (five) minutes as needed for chest pain. 25 tablet 12  . Probiotic Product (PROBIOTIC DAILY PO) Take 1 capsule by mouth daily after breakfast.      No current facility-administered medications for this visit.     Past Medical History:  Diagnosis Date  . Basal cell carcinoma   . BPH (benign prostatic hypertrophy)   . CAD (coronary artery disease) 1991   a. H/o MI w/ PTCA;  b. s/p CABG by Dr Roxan Hockey w/ LIMA-LAD, SVG-OM1-OM3, SVG-D1, SVG-PDA  . CHF (congestive heart failure) ([place])   . Complication of anesthesia    Wife reports history of confusion after last surgery in 10/2016-lasted a few days  . HTN (hypertension)   . Hyperlipidemia   . Internal hemorrhoids   . Moderate aortic stenosis    a. 08/2014 Echo: EF 55-60%, no rwma, Gr 1 DD, mod AS, mildly dil asc Ao, mild MR, sev dil LA.  . NSTEMI (non-ST elevated myocardial infarction) ([place]) 05/2017  PCI with DES to SVG-OM1. SVG-OM3 occluded, LIMA-LAD and SVG-RCA patent  . PAF (paroxysmal atrial fibrillation) ([place])    a. Dx 08/2014;  b. CHA2DS2VASc = 4 - decision made to withold [place] 2/2 unsteady gait and syncope.  . Sigmoid diverticulitis   . Syncope    a. 08/2014 - ? orthostatic    Past Surgical History:  Procedure Laterality Date  . CARDIAC CATHETERIZATION    . COLONOSCOPY    . CORONARY ARTERY BYPASS GRAFT  2000   Dr Roxan Hockey  LIMA-LAD, SVG-OM1-OM3, SVG-D1, SVG-PDA.   [name] CORONARY STENT INTERVENTION N/A 06/13/2017   Procedure: CORONARY STENT INTERVENTION;  Surgeon: Lorretta Harp, MD;  Location: [facility];  Service: Cardiovascular;  Laterality: N/A;  . CYSTOSCOPY N/A 12/07/2016   Procedure: CYSTOSCOPY;  Surgeon: Cleon Gustin, MD;  Location: [facility];  Service: Urology;  Laterality: N/A;  NEEDS 30 MIN TOTAL FOR SURGERY  . CYSTOSCOPY WITH URETHRAL DILATATION N/A 02/21/2017   Procedure: DILATATION OF BLADDER NECK WITH INJECTION OF MITOMYCIN C;  Surgeon: Cleon Gustin, MD;  Location: [facility];  Service: Urology;  Laterality: N/A;  . EYE SURGERY     bil cataract  . HIP ARTHROPLASTY Left 10/14/2015   Procedure: LEFT HIP HEMI ARTHROPLASTY;  Surgeon: [name], MD;  Location: [facility];  Service: Orthopedics;  Laterality: Left;  . LEFT HEART CATH AND CORS/GRAFTS ANGIOGRAPHY N/A 06/13/2017   Procedure: LEFT HEART CATH AND CORS/GRAFTS ANGIOGRAPHY;  Surgeon: Lorretta Harp, MD;  Location: [facility];  Service: Cardiovascular;  Laterality: N/A;  . SHOULDER SURGERY     x2  . TONSILLECTOMY    . TRANSTHORACIC ECHOCARDIOGRAM  10-06-2016   dr Stanford Breed   mild LVH, ef 40-45%, diffuse hypokinesis/ moderate AV calcification leaflets and annulus,  severe thickened leaflets, mod.-sev. aortic stenosis (Valva area 0.61cm^2, mean grandient 62mmHg, peak gradiant 52mmHg)/  moderate thickened , mild calcified MV leaflets without stenosis, moderate MR/  severe LAE/ trivial PR and TR  . TRANSURETHRAL RESECTION OF BLADDER NECK N/A 12/07/2016   Procedure: TRANSURETHRAL RESECTION OF BLADDER NECK;  Surgeon: Cleon Gustin, MD;  Location: [facility];  Service: Urology;  Laterality: N/A;  . XI ROBOTIC ASSISTED SIMPLE PROSTATECTOMY N/A 11/04/2016   Procedure: XI ROBOTIC ASSISTED SIMPLE PROSTATECTOMY;  Surgeon: Cleon Gustin, MD;  Location: [facility];  Service: Urology;  Laterality: N/A;    Social History   Socioeconomic History  . Marital status: Married    Spouse name: [place]  . Number of children: 2  . Years of education: Not on file  . Highest education  level: Bachelor's degree (e.g., BA, AB, BS)  Occupational History  . 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 Smoking status: Former Smoker    Packs/day: 0.50    Years: 15.00    Pack years: 7.50    Quit date: 06/29/1971    Years since quitting: 48.2  . Smokeless tobacco: Never Used  Substance and Sexual Activity  . Alcohol use: Yes    Alcohol/week: 1.0 - 2.0 standard drinks    Types: 1 - 2 Glasses of wine per week    Comment: alternates wine and liquor. 1/2 pint liquor/week  . Drug use: No  . Sexual activity: Never    Comment: lives with wife, retired English as a second language teacher, no dietary restrictions  Other Topics Concern  . Not on file  Social History Narrative   Pt and wife have an Independent home at [facility], wants to  move to an apartment soon.   Right hand, he has 2 children   Social Determinants of Health   Financial Resource Strain:   . Difficulty of Paying Living Expenses:   Food Insecurity:   . Worried About Charity fundraiser in the Last Year:   . Arboriculturist in the Last Year:   Transportation Needs:   . Film/video editor (Medical):   [name] Lack of Transportation (Non-Medical):   Physical Activity:   . Days of Exercise per Week:   . Minutes of Exercise per Session:   Stress:   . Feeling of Stress :   Social Connections:   . Frequency of Communication with Friends and Family:   . Frequency of Social Gatherings with Friends and Family:   . Attends Religious Services:   . Active Member of Clubs or Organizations:   . Attends Archivist Meetings:   [name] Marital Status:   Intimate Partner Violence:   . Fear of Current or Ex-Partner:   . Emotionally Abused:   [name] Physically Abused:   . Sexually Abused:     Family History  Problem Relation Age of Onset  . Coronary artery disease Father   . Hyperlipidemia Father   . Heart attack Father   . Alcohol abuse Father   . Bipolar disorder Mother   . Heart disease Mother    . Alcohol abuse Brother   . Cancer Brother   . Cancer Maternal Grandfather        oral cancer, chew tobacco  . Cancer Sister   . Glaucoma Sister   . Alcohol abuse Other     ROS: no fevers or chills, productive cough, hemoptysis, dysphasia, odynophagia, melena, hematochezia, dysuria, hematuria, rash, seizure activity, orthopnea, PND, pedal edema, claudication. Remaining systems are negative.  Physical Exam: Well-developed frail in no acute distress.  Skin is warm and dry.  HEENT status post resection of skin cancers recently. Neck is supple.  Chest is clear to auscultation with normal expansion.  Cardiovascular exam is regular rate and rhythm.  3/6 systolic murmur left sternal border. Abdominal exam nontender or distended. No masses palpated. Extremities show trace to 1+ edema. neuro grossly intact  ECG-sinus rhythm with occasional PVC, nonspecific ST changes, left ventricular hypertrophy, left axis deviation.  Personally reviewed  A/P  1 paroxysmal atrial fibrillation-patient remains in sinus rhythm today. Continue amiodarone. Check TSH, liver functions and chest x-ray. Anticoagulation discontinued previously because of recurrent dizzy spells/falling.  2 moderate to severe aortic stenosis-patient notes increased dyspnea.  Question related to aortic stenosis.  I explained that his aortic stenosis will worsen and could be causing his dyspnea.  However he and his wife are in agreement that he would never be agreeable for any procedures.  We will therefore plan medical therapy.  Discontinue amlodipine.  Treat with Lasix 20 mg every other day.  Check potassium and renal function in 1 week.  3 hypertension-I am adding low-dose Lasix.  We will discontinue amlodipine as his blood pressure is borderline.  4 hyperlipidemia-continue statin.  5 coronary artery disease-continue aspirin and statin.  6 peripheral vascular disease-patient denies claudication. Continue medical therapy.  7  history of syncope-no recurrent events.  Kirk Ruths, MD

## 2019-09-18 DIAGNOSIS — C44329 Squamous cell carcinoma of skin of other parts of face: Secondary | ICD-10-CM | POA: Diagnosis not present

## 2019-09-18 DIAGNOSIS — D0439 Carcinoma in situ of skin of other parts of face: Secondary | ICD-10-CM | POA: Diagnosis not present

## 2019-09-18 DIAGNOSIS — Z85828 Personal history of other malignant neoplasm of skin: Secondary | ICD-10-CM | POA: Diagnosis not present

## 2019-09-18 DIAGNOSIS — L98491 Non-pressure chronic ulcer of skin of other sites limited to breakdown of skin: Secondary | ICD-10-CM | POA: Diagnosis not present

## 2019-09-19 ENCOUNTER — Encounter: Payer: Self-pay | Admitting: Cardiology

## 2019-09-19 ENCOUNTER — Ambulatory Visit (INDEPENDENT_AMBULATORY_CARE_PROVIDER_SITE_OTHER): Payer: PPO | Admitting: Cardiology

## 2019-09-19 ENCOUNTER — Other Ambulatory Visit: Payer: Self-pay

## 2019-09-19 VITALS — BP 118/72 | HR 60 | Ht 65.0 in | Wt 145.0 lb

## 2019-09-19 DIAGNOSIS — I480 Paroxysmal atrial fibrillation: Secondary | ICD-10-CM

## 2019-09-19 DIAGNOSIS — Z951 Presence of aortocoronary bypass graft: Secondary | ICD-10-CM

## 2019-09-19 DIAGNOSIS — I350 Nonrheumatic aortic (valve) stenosis: Secondary | ICD-10-CM

## 2019-09-19 DIAGNOSIS — I10 Essential (primary) hypertension: Secondary | ICD-10-CM

## 2019-09-19 MED ORDER — FUROSEMIDE 20 MG PO TABS: 20.0000 mg | ORAL_TABLET | ORAL | 3 refills | Status: DC

## 2019-09-19 NOTE — Patient Instructions (Signed)
Medication Instructions:  STOP AMLODIPINE  START FUROSEMIDE 20 MG ONE TABLET EVERY OTHER DAY  *If you need a refill on your cardiac medications before your next appointment, please call your pharmacy*   Lab Work: Your physician recommends that you return for lab work in: [place]  If you have labs (blood work) drawn today and your tests are completely normal, you will receive your results only by: [name] MyChart Message (if you have MyChart) OR . A paper copy in the mail If you have any lab test that is abnormal or we need to change your treatment, we will call you to review the results.   Testing/Procedures: A chest x-ray takes a picture of the organs and structures inside the chest, including the heart, lungs, and blood vessels. This test can show several things, including, whether the heart is enlarges; whether fluid is building up in the lungs; and whether pacemaker / defibrillator leads are still in place. IN ONE WEEK IN THE [place] [facility]   Follow-Up: At [facility], you and your health needs are our priority.  As part of our continuing mission to provide you with exceptional heart care, we have created designated Provider Care Teams.  These Care Teams include your primary Cardiologist (physician) and Advanced Practice Providers (APPs -  Physician Assistants and Nurse Practitioners) who all work together to provide you with the care you need, when you need it.  We recommend signing up for the patient portal called "MyChart".  Sign up information is provided on this After Visit Summary.  MyChart is used to connect with patients for Virtual Visits (Telemedicine).  Patients are able to view lab/test results, encounter notes, upcoming appointments, etc.  Non-urgent messages can be sent to your provider as well.   To learn more about what you can do with MyChart, go to [email or web address].    Your next appointment:   6 month(s)  The format for your next appointment:   Either In  Person or Virtual  Provider:    Your physician wants you to follow-up in: [place] will receive a reminder letter in the mail two months in advance. If you don't receive a letter, please call our office to schedule the follow-up appointment.

## 2019-10-02 ENCOUNTER — Other Ambulatory Visit: Payer: Self-pay

## 2019-10-02 ENCOUNTER — Ambulatory Visit (HOSPITAL_BASED_OUTPATIENT_CLINIC_OR_DEPARTMENT_OTHER)
Admission: RE | Admit: 2019-10-02 | Discharge: 2019-10-02 | Disposition: A | Discharge status: Home / Self Care | Payer: PPO | Source: Ambulatory Visit | Attending: Cardiology | Admitting: Cardiology

## 2019-10-02 DIAGNOSIS — R599 Enlarged lymph nodes, unspecified: Secondary | ICD-10-CM | POA: Diagnosis not present

## 2019-10-02 DIAGNOSIS — Z85828 Personal history of other malignant neoplasm of skin: Secondary | ICD-10-CM | POA: Diagnosis not present

## 2019-10-02 DIAGNOSIS — I480 Paroxysmal atrial fibrillation: Secondary | ICD-10-CM | POA: Diagnosis not present

## 2019-10-02 DIAGNOSIS — D0439 Carcinoma in situ of skin of other parts of face: Secondary | ICD-10-CM | POA: Diagnosis not present

## 2019-10-02 DIAGNOSIS — L570 Actinic keratosis: Secondary | ICD-10-CM | POA: Diagnosis not present

## 2019-10-03 LAB — BASIC METABOLIC PANEL
BUN/Creatinine Ratio: 15 (ref 10–24)
BUN: 17 mg/dL (ref 8–27)
CO2: 22 mmol/L (ref 20–29)
Calcium: 8.8 mg/dL (ref 8.6–10.2)
Chloride: 103 mmol/L (ref 96–106)
Creatinine, Ser: 1.12 mg/dL (ref 0.76–1.27)
GFR calc Af Amer: 67 mL/min/{1.73_m2} (ref >59)
GFR calc non Af Amer: 58 mL/min/{1.73_m2} — ABNORMAL LOW (ref >59)
Glucose: 87 mg/dL (ref 65–99)
Potassium: 4.3 mmol/L (ref 3.5–5.2)
Sodium: 139 mmol/L (ref 134–144)

## 2019-10-03 LAB — TSH: TSH: 2.78 u[IU]/mL (ref 0.450–4.500)

## 2019-10-03 LAB — HEPATIC FUNCTION PANEL
ALT: 14 IU/L (ref 0–44)
AST: 23 IU/L (ref 0–40)
Albumin: 3.8 g/dL (ref 3.6–4.6)
Alkaline Phosphatase: 80 IU/L (ref 39–117)
Bilirubin Total: 0.6 mg/dL (ref 0.0–1.2)
Bilirubin, Direct: 0.21 mg/dL (ref 0.00–0.40)
Total Protein: 6.2 g/dL (ref 6.0–8.5)

## 2019-10-04 ENCOUNTER — Telehealth: Payer: Self-pay | Admitting: Cardiology

## 2019-10-04 NOTE — Telephone Encounter (Signed)
Called ps wife and she verbalized understanding of the [facility] recommendations. She will also call to make an appt with his PCP.

## 2019-10-04 NOTE — Telephone Encounter (Addendum)
Pts wife called to report the pt has been having dizzy spells in the morning when rising.   She is not sure of his BP during those spells but yesterday his BP in the morning premeds was 177/91 HR 55 and later 150/91 HR 55.   He denies palpitations, chest pain, headache.   She is concerned the dizzy spells he had las year and coming back on him and more frequently. She says he goes to the bathroom in th middle of the night with out difficulties but hard to get out of bed of bed in the morning with the dizziness.   In going over his meds..he saw Dr. Stanford Breed 09/19/19 and was suppose to d/c amlodipine but she stopped his Amiodarone by mistake. She did not realize the error until today. His lower extremity edema has improved since starting the Lasix.   Will forward to the [facility] for recommendations since Dr. Stanford Breed is out of the office this week re: med management going forward.   ADDENDUM:  Pts wife called back and after going through his "pill box" she has been correctly giving him the Amiodarone and not the Amlodipine.   Will forward to the MD for review re: the morning dizzy spells.

## 2019-10-04 NOTE — Telephone Encounter (Signed)
Please HOLD furosemide for 4 days. Dizziness may be worsen by depleted fluids.  Continue to monitor BP and HR twice daily and keep records. Call back if heart rate under 50.   Dizzy spells are not new for this patinet and may be better assess by PCP. May follow up with APP as needed.

## 2019-10-04 NOTE — Telephone Encounter (Signed)
New message   STAT if patient feels like he/she is going to faint   1) Are you dizzy now?no   Do you feel faint or have you passed out? No  2) Do you have any other symptoms?no   3) Have you checked your HR and BP (record if available)?55 hr 156/91

## 2019-10-04 NOTE — Telephone Encounter (Signed)
LMTCB

## 2019-10-08 ENCOUNTER — Telehealth: Payer: Self-pay | Admitting: Cardiology

## 2019-10-08 NOTE — Telephone Encounter (Signed)
Patient's wife calling to speak with Rockney Ghee. States it is in regards to what they spoke about last week. See last telephone note.

## 2019-10-08 NOTE — Telephone Encounter (Signed)
Spoke with wife and arranged PAOV with [place] PA on 4/16 @ 12 noon

## 2019-10-08 NOTE — Telephone Encounter (Signed)
Returned call to wife of patient of Dr. Stanford Breed who reports continue dizziness. He has h/o vertigo Dizziness started around time of med change from amlodipine to lasix at last OV Patient took penicillin for about 1 week for tooth abscess around same time of dizziness  He called in on 4/8 with initial complaints He was advised on 4/8 to HOLD lasix to 4 days but dizziness has not improved - see phone note from Raquel  138/80 HR 88 on 4/9 148/86 HR 67 on 4/9 135/78 HR 65 on 4/10 133/74 HR 56 on 4/10 - faint/dizzy 161/96 HR 57 on 4/11 - not feeling great 181/106 HR 66 on 4/11 - weak/faint (9pm) 137/83 HR 67 this morning -  dizzy, legs weak  Will route to MD to advise

## 2019-10-08 NOTE — Telephone Encounter (Signed)
Based on HR and BP, medications do not seem to be the problem; would arrange APPov Kirk Ruths

## 2019-10-09 NOTE — Progress Notes (Signed)
Cardiology Office Note   Date:  10/10/2019   ID:  Justin [name], DOB 03/13/1929, MRN [id number]  PCP:  Mosie Lukes, MD  Cardiologist: Lubertha South  CC: Dizziness   History of Present Illness: Justin [name] is a 86 y.o. male who presents for ongoing assessment and management of CAD, s/p CABG 2001, atrial fib, NSTEMI in 2018 cath showed occluded native vessels, 95 SVG to OM1 (PCI with DES), occluded and 40 is not high blood pressure but said today to none at all here as well the pain he had else happen now SVG to OM2 and 3, patent SVG to RCA and patent LIMA to LAD. Also on amiodarone for PAF.  Echocardiogram December 2020 showed ejection fraction 40 to 45%, severe left ventricular hypertrophy, grade 1 diastolic dysfunction, biatrial enlargement, mild mitral regurgitation, severe aortic stenosis with mean gradient 26 mmHg and calculated valve area 0.6 cm. Since last seen,patient notes some increased dyspnea on exertion.  He also notes dyspnea with talking.  Minimal pedal edema  Called our office on 10/08/2019 due to complaints of dizziness which occurred with addition of low dose lasix and discontinuation of Amlodipine on 09/19/2019 when seen last by Dr. Stanford Breed.  The patient reports that he is actually felt worse instead of better with the medication changes on last office visit.  He was advised to discontinue Lasix due to his dizziness.  He has not taken any since.  He continues to have dizziness with position changes.  He states that if he gets up in middle the night to use the bathroom he does not feel dizzy however if he gets up first thing in the morning he does have some dizziness.   Past Medical History:  Diagnosis Date  . Basal cell carcinoma   . BPH (benign prostatic hypertrophy)   . CAD (coronary artery disease) 1991   a. H/o MI w/ PTCA;  b. s/p CABG by Dr Roxan Hockey w/ LIMA-LAD, SVG-OM1-OM3, SVG-D1, SVG-PDA  . CHF (congestive heart failure) ([place])   . Complication of  anesthesia    Wife reports history of confusion after last surgery in 10/2016-lasted a few days  . HTN (hypertension)   . Hyperlipidemia   . Internal hemorrhoids   . Moderate aortic stenosis    a. 08/2014 Echo: EF 55-60%, no rwma, Gr 1 DD, mod AS, mildly dil asc Ao, mild MR, sev dil LA.  . NSTEMI (non-ST elevated myocardial infarction) ([place]) 05/2017    PCI with DES to SVG-OM1. SVG-OM3 occluded, LIMA-LAD and SVG-RCA patent  . PAF (paroxysmal atrial fibrillation) ([place])    a. Dx 08/2014;  b. CHA2DS2VASc = 4 - decision made to withold [place] 2/2 unsteady gait and syncope.  . Sigmoid diverticulitis   . Syncope    a. 08/2014 - ? orthostatic    Past Surgical History:  Procedure Laterality Date  . CARDIAC CATHETERIZATION    . COLONOSCOPY    . CORONARY ARTERY BYPASS GRAFT  2000   Dr Roxan Hockey  LIMA-LAD, SVG-OM1-OM3, SVG-D1, SVG-PDA.   [name] CORONARY STENT INTERVENTION N/A 06/13/2017   Procedure: CORONARY STENT INTERVENTION;  Surgeon: Lorretta Harp, MD;  Location: [facility];  Service: Cardiovascular;  Laterality: N/A;  . CYSTOSCOPY N/A 12/07/2016   Procedure: CYSTOSCOPY;  Surgeon: Cleon Gustin, MD;  Location: [facility];  Service: Urology;  Laterality: N/A;  NEEDS 30 MIN TOTAL FOR SURGERY  . CYSTOSCOPY WITH URETHRAL DILATATION N/A 02/21/2017   Procedure: DILATATION OF BLADDER NECK WITH INJECTION OF MITOMYCIN  C;  Surgeon: Cleon Gustin, MD;  Location: [facility];  Service: Urology;  Laterality: N/A;  . EYE SURGERY     bil cataract  . HIP ARTHROPLASTY Left 10/14/2015   Procedure: LEFT HIP HEMI ARTHROPLASTY;  Surgeon: [name], MD;  Location: [facility];  Service: Orthopedics;  Laterality: Left;  . LEFT HEART CATH AND CORS/GRAFTS ANGIOGRAPHY N/A 06/13/2017   Procedure: LEFT HEART CATH AND CORS/GRAFTS ANGIOGRAPHY;  Surgeon: Lorretta Harp, MD;  Location: [facility];  Service: Cardiovascular;  Laterality: N/A;  . SHOULDER SURGERY     x2  . TONSILLECTOMY    . TRANSTHORACIC  ECHOCARDIOGRAM  10-06-2016   dr Stanford Breed   mild LVH, ef 40-45%, diffuse hypokinesis/ moderate AV calcification leaflets and annulus,  severe thickened leaflets, mod.-sev. aortic stenosis (Valva area 0.61cm^2, mean grandient 110mmHg, peak gradiant 63mmHg)/  moderate thickened , mild calcified MV leaflets without stenosis, moderate MR/  severe LAE/ trivial PR and TR  . TRANSURETHRAL RESECTION OF BLADDER NECK N/A 12/07/2016   Procedure: TRANSURETHRAL RESECTION OF BLADDER NECK;  Surgeon: Cleon Gustin, MD;  Location: [facility];  Service: Urology;  Laterality: N/A;  . XI ROBOTIC ASSISTED SIMPLE PROSTATECTOMY N/A 11/04/2016   Procedure: XI ROBOTIC ASSISTED SIMPLE PROSTATECTOMY;  Surgeon: Cleon Gustin, MD;  Location: [facility];  Service: Urology;  Laterality: N/A;     Current Outpatient Medications  Medication Sig Dispense Refill  . acetaminophen (TYLENOL) 500 MG tablet Take 500 mg by mouth daily as needed for moderate pain.    [name] amiodarone (PACERONE) 200 MG tablet TAKE ONE (1) TABLET BY MOUTH EVERY DAY 90 tablet 1  . aspirin EC 81 MG tablet Take 1 tablet (81 mg total) by mouth daily. 90 tablet 3  . beta carotene w/minerals (OCUVITE) tablet Take 1 tablet by mouth daily after breakfast.     . carbidopa-levodopa (SINEMET IR) 25-100 MG tablet Take 1.5 tablets by mouth 3 (three) times daily. 135 tablet 4  . Carboxymethylcellulose Sodium (THERATEARS OP) Place 1 drop into both eyes daily as needed (dry eyes).     [name] docusate sodium (COLACE) 100 MG capsule Take 100 mg by mouth daily after breakfast.     . ferrous sulfate 325 (65 FE) MG tablet Take 325 mg by mouth daily after supper.     . finasteride (PROSCAR) 5 MG tablet Take 5 mg by mouth daily after breakfast.     . levothyroxine (SYNTHROID) 75 MCG tablet TAKE 1 TABLET BY MOUTH DAILY BEFORE BREAKFAST 90 tablet 3  . lovastatin (MEVACOR) 20 MG tablet TAKE 1 TABLET DAILY AT 6PM 90 tablet 2  . meclizine (ANTIVERT) 25 MG tablet Take 1 tablet (25 mg total)  by mouth 3 (three) times daily as needed for dizziness. 30 tablet 0  . Multiple Vitamin (MULTIVITAMIN WITH MINERALS) TABS tablet Take 1 tablet by mouth daily after breakfast.     . nitroGLYCERIN (NITROSTAT) 0.4 MG SL tablet Place 1 tablet (0.4 mg total) under the tongue every 5 (five) minutes as needed for chest pain. 25 tablet 12  . Probiotic Product (PROBIOTIC DAILY PO) Take 1 capsule by mouth daily after breakfast.      No current facility-administered medications for this visit.    Allergies:   Morphine, Morphine and related, Diltiazem hcl, Metoprolol, Toradol [ketorolac tromethamine], Hydrocodone, Sulfa antibiotics, and Dutasteride    Social History:  The patient  reports that he quit smoking about 48 years ago. He has a 7.50 pack-year smoking history. He  has never used smokeless tobacco. He reports current alcohol use of about 1.0 - 2.0 standard drinks of alcohol per week. He reports that he does not use drugs.   Family History:  The patient's family history includes Alcohol abuse in his brother, father, and another family member; Bipolar disorder in his mother; Cancer in his brother, maternal grandfather, and sister; Coronary artery disease in his father; Glaucoma in his sister; Heart attack in his father; Heart disease in his mother; Hyperlipidemia in his father.    ROS: All other systems are reviewed and negative. Unless otherwise mentioned in H&P    PHYSICAL EXAM: VS:  BP 122/82   Pulse 61   Ht 5\' 5"  (1.651 m)   Wt 154 lb (69.9 kg)   SpO2 100%   BMI 25.63 kg/m  , BMI Body mass index is 25.63 kg/m. GEN: Well nourished, well developed, in no acute distress HEENT: normal Neck: no JVD, carotid bruits, or masses Cardiac: RRR; no murmurs, rubs, or gallops,no edema  Respiratory:  Clear to auscultation bilaterally, normal work of breathing GI: soft, nontender, nondistended, + BS MS: no deformity or atrophy Skin: warm and dry, no rash Neuro:  Strength and sensation are  intact Psych: euthymic mood, full affect   EKG: Normal sinus rhythm, rate of 61 bpm, left ventricular hypertrophy (personally reviewed)  Recent Labs: 10/02/2019: ALT 14; BUN 17; Creatinine, Ser 1.12; Potassium 4.3; Sodium 139; TSH 2.780    Lipid Panel    Component Value Date/Time   CHOL 154 06/11/2017 0406   TRIG 39 06/11/2017 0406   HDL 76 06/11/2017 0406   CHOLHDL 2.0 06/11/2017 0406   VLDL 8 06/11/2017 0406   LDLCALC 70 06/11/2017 0406      Wt Readings from Last 3 Encounters:  10/10/19 154 lb (69.9 kg)  09/19/19 145 lb (65.8 kg)  05/21/19 150 lb (68 kg)      Other studies Reviewed: Echocardiogram 06/30/19  1. Left ventricular ejection fraction, by visual estimation, is 40 to  45%. The left ventricle has mild to moderately decreased function. There  is mild global hypokinesis of the left ventricle.  2. Left ventricular septal wall thickness was severely increased. Mildly  increased left ventricular posterior wall thickness. There is asymmetrical  increased left ventricular hypertrophy.  3. Left ventricular diastolic parameters are consistent with Grade I  diastolic dysfunction (impaired relaxation).  4. Global right ventricle has normal systolic function.The right  ventricular size is normal. No increase in right ventricular wall  thickness.  5. Left atrial size was moderately dilated.  6. Right atrial size was mildly dilated.  7. The mitral valve structure is abnormal with mild posterior annular  calcification. Mild mitral valve regurgitation. No evidence of mitral  stenosis.  8. The tricuspid valve is normal in structure. Tricuspid valve  regurgitation is trivial.  9. The aortic valve is normal in structure. Aortic valve regurgitation is  not visualized. Normal Flow-Low Gradient Severe Aortic stenosis: AVA by  VTI 0.55 cmsq, mean gradient 26 mmHg, Peak velocity 3.3 m/s, Indexed AVA  0.3 cmsq/msq, DI 0.18, SVI 36.  10. The pulmonic valve was normal in  structure. Pulmonic valve  regurgitation is not visualized.  11. Normal pulmonary artery systolic pressure.    ASSESSMENT AND PLAN:  1.  Chronic positional dizziness.  Unable to do orthostatics on him due to to significant dizziness.  The patient's initial blood pressure was [id number] systolic and then with sitting up blood pressure dropped to 122/82.  The patient  brought a list of his blood pressure recordings.  He had it was symptomatic with dizziness and lightheadedness with blood pressures less than 140, blood pressures in the 1 48-1 52 range he was asymptomatic.  I think we need to give him permissive hypertension in the setting of Parkinson's disease.  We will also take away his Lasix altogether and have him use it as needed only.  I have asked him to get some support hose to help with venous return.  2.  Parkinson's disease: We will follow with neurologist.  He is due to see him in 2 weeks.  Manipulation of Parkinson's medications may be necessary.  Will defer to him concerning adjustments and further treatment.  Some of this dizziness may also be related to his Parkinson's.  3.  Mitral valve regurgitation: Not a candidate for surgery.  4. Atrial fibrillation: Currently on amiodarone.  Remains in normal sinus rhythm.  No changes to his current regimen.  He has had recent labs TSH was normal.  Current medicines are reviewed at length with the patient today.  I have spent 25 minutes dedicated to the care of this patient on the date of this encounter to include pre-visit review of records, assessment, management and diagnostic testing,with shared decision making.  Labs/ tests ordered today include: None  Justin [name]. [place], ANP, AACC   10/10/2019 4:27 PM    [facility] [place] [address] Office [phone] Fax [phone]  Notice: This dictation was prepared with Dragon dictation along with smaller phrase technology. Any transcriptional errors that  result from this process are unintentional and may not be corrected upon review.

## 2019-10-10 ENCOUNTER — Other Ambulatory Visit: Payer: Self-pay

## 2019-10-10 ENCOUNTER — Ambulatory Visit (INDEPENDENT_AMBULATORY_CARE_PROVIDER_SITE_OTHER): Payer: PPO | Admitting: Adult Health

## 2019-10-10 ENCOUNTER — Encounter: Payer: Self-pay | Admitting: Adult Health

## 2019-10-10 VITALS — BP 122/82 | HR 61 | Ht 65.0 in | Wt 154.0 lb

## 2019-10-10 DIAGNOSIS — R42 Dizziness and giddiness: Secondary | ICD-10-CM | POA: Diagnosis not present

## 2019-10-10 DIAGNOSIS — I480 Paroxysmal atrial fibrillation: Secondary | ICD-10-CM

## 2019-10-10 DIAGNOSIS — G20 Parkinson's disease: Secondary | ICD-10-CM | POA: Diagnosis not present

## 2019-10-10 NOTE — Patient Instructions (Signed)
Medication Instructions:  STOP- Lasix  *If you need a refill on your cardiac medications before your next appointment, please call your pharmacy*   Lab Work: None Ordered  Testing/Procedures: None Ordered   Follow-Up: At [facility], you and your health needs are our priority.  As part of our continuing mission to provide you with exceptional heart care, we have created designated Provider Care Teams.  These Care Teams include your primary Cardiologist (physician) and Advanced Practice Providers (APPs -  Physician Assistants and Nurse Practitioners) who all work together to provide you with the care you need, when you need it.  We recommend signing up for the patient portal called "MyChart".  Sign up information is provided on this After Visit Summary.  MyChart is used to connect with patients for Virtual Visits (Telemedicine).  Patients are able to view lab/test results, encounter notes, upcoming appointments, etc.  Non-urgent messages can be sent to your provider as well.   To learn more about what you can do with MyChart, go to [email or web address].    Your next appointment:   2 month(s)  The format for your next appointment:   In Person  Provider:   You may see Kirk Ruths, MD or one of the following Advanced Practice Providers on your designated Care Team:    Kerin Ransom, PA-C  [place], [place]  Coletta Memos, [place]

## 2019-10-12 ENCOUNTER — Ambulatory Visit: Payer: PPO | Admitting: Cardiology

## 2019-10-17 DIAGNOSIS — D0439 Carcinoma in situ of skin of other parts of face: Secondary | ICD-10-CM | POA: Diagnosis not present

## 2019-10-17 DIAGNOSIS — C44329 Squamous cell carcinoma of skin of other parts of face: Secondary | ICD-10-CM | POA: Diagnosis not present

## 2019-10-19 NOTE — Progress Notes (Deleted)
NEUROLOGY FOLLOW UP OFFICE NOTE  Justin [name] [id number]  HISTORY OF PRESENT ILLNESS: Justin [name] is an 86 year old man with paroxysmal atrial fibrillation, orthostatic hypotension, hypertension, hyperlipidemia, oral cancer, diastolic CHFand coronary artery disease who follows up for cognitive impairment and questionable Parkinson's disease. He is accompanied on the phone with his wife.  UPDATE: Taking carbidopa-levodopa 25/100mg  1.5 tablets three times daily. He continues to experience positional dizziness.    HISTORY: He has had problems with balance for 6 years. He loses balance easily if his eyes are closed or if he stands in one place for several minutes. He cannot stand on one foot. He also notes trouble walking down hills as well as walking up stairs. When he walks, he will sometimes stumble. He also has chronic problems with his left foot though, such as bunions. He denies any neck pain or lower back pain radiating down the legs. He denies any sensation of numbness and tingling. He denies focal weakness but occasionally he reports generalized weakness where his legs give out. He denies any bowel or bladder dysfunction. When he closes his eyes, he has a sensation of sway, but no actual vertigo or fullness in his head. He does have occasional tinnitus and wears hearing aids. He has not had any sensation of passing out.To help with gait, he was started on a trial of carbidopa-levodopa which appeared to somewhat improve his gait.He wastaking carbidopa-levodopa 25/100 mg 3 times daily. 1-1/2 tablets 3 times daily caused dizziness.In November,carbidopa levodopa was discontinued to see if his gait significantly declined.  He had had imaging of the head for dizziness. CT of head from 09/13/14 was personally reviewed and revealed atrophy and chronic small vessel ischemic changes but nothing acute. Repeat CT of head from 03/23/16 was unchanged.  I saw him in 2014 and he  underwent a neuropathy workup. NCV-EMG was normal. 2 hour glucose tolerance test was normal. B12 was 542, methylmalonic acid 0.11, Sed Rate 12, folate over 24.8, RPR nonreactive, SPEP and IFE negative, ANA negative. Recent Hgb A1c wasa 5.2 and TSH was 4.34.  For a couple of years he had had episodes of confusion where he cannot process or collect his thoughts. It may last up to an hour and occur once a week. He may forget medications or names of people. Each time he has a surgery, he becomes confused and his memory gets worse. This occurred both after knee surgery and dilatation of bladder neck. In evaluating memory, B12 from 05/25/16 was 599. Repeat testing on 11/25/16 was 741. TSH was 7.432. He was previously on Aricept which was discontinued due to tiredness and dizziness.  He was admitted to the hospital in January 2020 for weakness and confusion and was treated for hyponatremia and UTI. He was admitted to the hospital again the following month for syncope. CTA of head and neck was negative for intracranial and extracranial large vessel occlusion or stenosis. Syncope thought to be secondary to orthostatic hypotension oro severe aortic stenosis. Eliquis was discontinued due to falls. No further syncope.  PAST MEDICAL HISTORY: Past Medical History:  Diagnosis Date  . Basal cell carcinoma   . BPH (benign prostatic hypertrophy)   . CAD (coronary artery disease) 1991   a. H/o MI w/ PTCA;  b. s/p CABG by Dr Roxan Hockey w/ LIMA-LAD, SVG-OM1-OM3, SVG-D1, SVG-PDA  . CHF (congestive heart failure) ([place])   . Complication of anesthesia    Wife reports history of confusion after last surgery in 10/2016-lasted a few  days  . HTN (hypertension)   . Hyperlipidemia   . Internal hemorrhoids   . Moderate aortic stenosis    a. 08/2014 Echo: EF 55-60%, no rwma, Gr 1 DD, mod AS, mildly dil asc Ao, mild MR, sev dil LA.  . NSTEMI (non-ST elevated myocardial infarction) ([place]) 05/2017    PCI with DES  to SVG-OM1. SVG-OM3 occluded, LIMA-LAD and SVG-RCA patent  . PAF (paroxysmal atrial fibrillation) ([place])    a. Dx 08/2014;  b. CHA2DS2VASc = 4 - decision made to withold [place] 2/2 unsteady gait and syncope.  . Sigmoid diverticulitis   . Syncope    a. 08/2014 - ? orthostatic    MEDICATIONS: Current Outpatient Medications on File Prior to Visit  Medication Sig Dispense Refill  . acetaminophen (TYLENOL) 500 MG tablet Take 500 mg by mouth daily as needed for moderate pain.    [name] amiodarone (PACERONE) 200 MG tablet TAKE ONE (1) TABLET BY MOUTH EVERY DAY 90 tablet 1  . aspirin EC 81 MG tablet Take 1 tablet (81 mg total) by mouth daily. 90 tablet 3  . beta carotene w/minerals (OCUVITE) tablet Take 1 tablet by mouth daily after breakfast.     . carbidopa-levodopa (SINEMET IR) 25-100 MG tablet Take 1.5 tablets by mouth 3 (three) times daily. 135 tablet 4  . Carboxymethylcellulose Sodium (THERATEARS OP) Place 1 drop into both eyes daily as needed (dry eyes).     [name] docusate sodium (COLACE) 100 MG capsule Take 100 mg by mouth daily after breakfast.     . ferrous sulfate 325 (65 FE) MG tablet Take 325 mg by mouth daily after supper.     . finasteride (PROSCAR) 5 MG tablet Take 5 mg by mouth daily after breakfast.     . levothyroxine (SYNTHROID) 75 MCG tablet TAKE 1 TABLET BY MOUTH DAILY BEFORE BREAKFAST 90 tablet 3  . lovastatin (MEVACOR) 20 MG tablet TAKE 1 TABLET DAILY AT 6PM 90 tablet 2  . meclizine (ANTIVERT) 25 MG tablet Take 1 tablet (25 mg total) by mouth 3 (three) times daily as needed for dizziness. 30 tablet 0  . Multiple Vitamin (MULTIVITAMIN WITH MINERALS) TABS tablet Take 1 tablet by mouth daily after breakfast.     . nitroGLYCERIN (NITROSTAT) 0.4 MG SL tablet Place 1 tablet (0.4 mg total) under the tongue every 5 (five) minutes as needed for chest pain. 25 tablet 12  . Probiotic Product (PROBIOTIC DAILY PO) Take 1 capsule by mouth daily after breakfast.      No current facility-administered  medications on file prior to visit.    ALLERGIES: Allergies  Allergen Reactions  . Morphine Other (See Comments)    Severe confusion  . Morphine And Related Other (See Comments)    Severe confusion  . Diltiazem Hcl Other (See Comments)    bradycardia  . Metoprolol Other (See Comments)    bradycardia  . Toradol [Ketorolac Tromethamine] Other (See Comments)    Dizzy and light headed  . Hydrocodone Other (See Comments)    hallucinations  . Sulfa Antibiotics Other (See Comments)    Caused extreme weakness/low sodium  Feb. 2020 per wife Caused extreme weakness/low sodium  Feb. 2020 per wife  . Dutasteride Diarrhea    GI upset and incontinence    FAMILY HISTORY: Family History  Problem Relation Age of Onset  . Coronary artery disease Father   . Hyperlipidemia Father   . Heart attack Father   . Alcohol abuse Father   . Bipolar disorder Mother   .  Heart disease Mother   . Alcohol abuse Brother   . Cancer Brother   . Cancer Maternal Grandfather        oral cancer, chew tobacco  . Cancer Sister   . Glaucoma Sister   . Alcohol abuse Other    ***.  SOCIAL HISTORY: Social History   Socioeconomic History  . Marital status: Married    Spouse name: [place]  . Number of children: 2  . Years of education: Not on file  . Highest education level: Bachelor's degree (e.g., BA, AB, BS)  Occupational History  . 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 Smoking status: Former Smoker    Packs/day: 0.50    Years: 15.00    Pack years: 7.50    Quit date: 06/29/1971    Years since quitting: 48.3  . Smokeless tobacco: Never Used  Substance and Sexual Activity  . Alcohol use: Yes    Alcohol/week: 1.0 - 2.0 standard drinks    Types: 1 - 2 Glasses of wine per week    Comment: alternates wine and liquor. 1/2 pint liquor/week  . Drug use: No  . Sexual activity: Never    Comment: lives with wife, retired English as a second language teacher, no dietary restrictions   Other Topics Concern  . Not on file  Social History Narrative   Pt and wife have an Independent home at [facility], wants to move to an apartment soon.   Right hand, he has 2 children   Social Determinants of Health   Financial Resource Strain:   . Difficulty of Paying Living Expenses:   Food Insecurity:   . Worried About Charity fundraiser in the Last Year:   . Arboriculturist in the Last Year:   Transportation Needs:   . Film/video editor (Medical):   [name] Lack of Transportation (Non-Medical):   Physical Activity:   . Days of Exercise per Week:   . Minutes of Exercise per Session:   Stress:   . Feeling of Stress :   Social Connections:   . Frequency of Communication with Friends and Family:   . Frequency of Social Gatherings with Friends and Family:   . Attends Religious Services:   . Active Member of Clubs or Organizations:   . Attends Archivist Meetings:   [name] Marital Status:   Intimate Partner Violence:   . Fear of Current or Ex-Partner:   . Emotionally Abused:   [name] Physically Abused:   . Sexually Abused:     PHYSICAL EXAM: *** General: No acute distress.  Patient appears ***-groomed.   Head:  Normocephalic/atraumatic Eyes:  Fundi examined but not visualized Neck: supple, no paraspinal tenderness, full range of motion Heart:  Regular rate and rhythm Lungs:  Clear to auscultation bilaterally Back: No paraspinal tenderness Neurological Exam: alert and oriented to person, place, and time. Attention span and concentration intact, recent and remote memory intact, fund of knowledge intact.  Speech fluent and not dysarthric, language intact.  CN II-XII intact. Bulk normal; cogwheel rigidity in the wrists; no tremor; muscle strength 5/5 throughout.  Sensation to light touch intact.  Deep tendon reflexes 2+ throughout.  Finger to nose testing intact.  Upright posture side bent right with reduced arm-swing bilaterally.  Reduced stride but able to pick up feet.   Romberg with sway..  IMPRESSION: Mild dementia.  PLAN: ***  Justin Clines, DO  CC: [name], MD

## 2019-10-21 ENCOUNTER — Inpatient Hospital Stay (HOSPITAL_COMMUNITY)
Admission: EM | Admit: 2019-10-21 | Discharge: 2019-10-25 | DRG: 871 | Disposition: A | Discharge status: Skilled Nursing Facility | Payer: PPO | Attending: Internal Medicine | Admitting: Internal Medicine

## 2019-10-21 ENCOUNTER — Encounter (HOSPITAL_COMMUNITY): Payer: Self-pay | Admitting: Emergency Medicine

## 2019-10-21 ENCOUNTER — Emergency Department (HOSPITAL_COMMUNITY): Payer: PPO

## 2019-10-21 ENCOUNTER — Other Ambulatory Visit: Payer: Self-pay

## 2019-10-21 DIAGNOSIS — R262 Difficulty in walking, not elsewhere classified: Secondary | ICD-10-CM | POA: Diagnosis present

## 2019-10-21 DIAGNOSIS — R4182 Altered mental status, unspecified: Secondary | ICD-10-CM | POA: Diagnosis not present

## 2019-10-21 DIAGNOSIS — E039 Hypothyroidism, unspecified: Secondary | ICD-10-CM | POA: Diagnosis not present

## 2019-10-21 DIAGNOSIS — Z66 Do not resuscitate: Secondary | ICD-10-CM | POA: Diagnosis not present

## 2019-10-21 DIAGNOSIS — Z955 Presence of coronary angioplasty implant and graft: Secondary | ICD-10-CM | POA: Diagnosis not present

## 2019-10-21 DIAGNOSIS — I350 Nonrheumatic aortic (valve) stenosis: Secondary | ICD-10-CM | POA: Diagnosis not present

## 2019-10-21 DIAGNOSIS — I480 Paroxysmal atrial fibrillation: Secondary | ICD-10-CM | POA: Diagnosis not present

## 2019-10-21 DIAGNOSIS — Z85828 Personal history of other malignant neoplasm of skin: Secondary | ICD-10-CM

## 2019-10-21 DIAGNOSIS — I255 Ischemic cardiomyopathy: Secondary | ICD-10-CM | POA: Diagnosis not present

## 2019-10-21 DIAGNOSIS — M6281 Muscle weakness (generalized): Secondary | ICD-10-CM | POA: Diagnosis not present

## 2019-10-21 DIAGNOSIS — I110 Hypertensive heart disease with heart failure: Secondary | ICD-10-CM | POA: Diagnosis present

## 2019-10-21 DIAGNOSIS — G9341 Metabolic encephalopathy: Secondary | ICD-10-CM | POA: Diagnosis not present

## 2019-10-21 DIAGNOSIS — I252 Old myocardial infarction: Secondary | ICD-10-CM

## 2019-10-21 DIAGNOSIS — R443 Hallucinations, unspecified: Secondary | ICD-10-CM | POA: Diagnosis not present

## 2019-10-21 DIAGNOSIS — R531 Weakness: Secondary | ICD-10-CM

## 2019-10-21 DIAGNOSIS — Z885 Allergy status to narcotic agent status: Secondary | ICD-10-CM

## 2019-10-21 DIAGNOSIS — Z96642 Presence of left artificial hip joint: Secondary | ICD-10-CM | POA: Diagnosis present

## 2019-10-21 DIAGNOSIS — Z20822 Contact with and (suspected) exposure to covid-19: Secondary | ICD-10-CM | POA: Diagnosis present

## 2019-10-21 DIAGNOSIS — Z79890 Hormone replacement therapy (postmenopausal): Secondary | ICD-10-CM

## 2019-10-21 DIAGNOSIS — Z888 Allergy status to other drugs, medicaments and biological substances status: Secondary | ICD-10-CM

## 2019-10-21 DIAGNOSIS — Z8249 Family history of ischemic heart disease and other diseases of the circulatory system: Secondary | ICD-10-CM | POA: Diagnosis not present

## 2019-10-21 DIAGNOSIS — Z7982 Long term (current) use of aspirin: Secondary | ICD-10-CM

## 2019-10-21 DIAGNOSIS — F0280 Dementia in other diseases classified elsewhere without behavioral disturbance: Secondary | ICD-10-CM | POA: Diagnosis present

## 2019-10-21 DIAGNOSIS — R339 Retention of urine, unspecified: Secondary | ICD-10-CM | POA: Diagnosis present

## 2019-10-21 DIAGNOSIS — I509 Heart failure, unspecified: Secondary | ICD-10-CM

## 2019-10-21 DIAGNOSIS — R442 Other hallucinations: Secondary | ICD-10-CM | POA: Diagnosis not present

## 2019-10-21 DIAGNOSIS — A4150 Gram-negative sepsis, unspecified: Secondary | ICD-10-CM | POA: Diagnosis not present

## 2019-10-21 DIAGNOSIS — A419 Sepsis, unspecified organism: Secondary | ICD-10-CM | POA: Diagnosis not present

## 2019-10-21 DIAGNOSIS — R6520 Severe sepsis without septic shock: Secondary | ICD-10-CM | POA: Diagnosis not present

## 2019-10-21 DIAGNOSIS — G20 Parkinson's disease: Secondary | ICD-10-CM | POA: Diagnosis not present

## 2019-10-21 DIAGNOSIS — Z83438 Family history of other disorder of lipoprotein metabolism and other lipidemia: Secondary | ICD-10-CM

## 2019-10-21 DIAGNOSIS — I10 Essential (primary) hypertension: Secondary | ICD-10-CM | POA: Diagnosis not present

## 2019-10-21 DIAGNOSIS — I5042 Chronic combined systolic (congestive) and diastolic (congestive) heart failure: Secondary | ICD-10-CM | POA: Diagnosis not present

## 2019-10-21 DIAGNOSIS — I2510 Atherosclerotic heart disease of native coronary artery without angina pectoris: Secondary | ICD-10-CM | POA: Diagnosis not present

## 2019-10-21 DIAGNOSIS — R2681 Unsteadiness on feet: Secondary | ICD-10-CM | POA: Diagnosis not present

## 2019-10-21 DIAGNOSIS — E785 Hyperlipidemia, unspecified: Secondary | ICD-10-CM | POA: Diagnosis present

## 2019-10-21 DIAGNOSIS — Z882 Allergy status to sulfonamides status: Secondary | ICD-10-CM

## 2019-10-21 DIAGNOSIS — Z87891 Personal history of nicotine dependence: Secondary | ICD-10-CM

## 2019-10-21 DIAGNOSIS — G9340 Encephalopathy, unspecified: Secondary | ICD-10-CM | POA: Diagnosis not present

## 2019-10-21 DIAGNOSIS — N401 Enlarged prostate with lower urinary tract symptoms: Secondary | ICD-10-CM | POA: Diagnosis present

## 2019-10-21 DIAGNOSIS — Z83511 Family history of glaucoma: Secondary | ICD-10-CM

## 2019-10-21 DIAGNOSIS — R0902 Hypoxemia: Secondary | ICD-10-CM | POA: Diagnosis not present

## 2019-10-21 DIAGNOSIS — I5040 Unspecified combined systolic (congestive) and diastolic (congestive) heart failure: Secondary | ICD-10-CM | POA: Diagnosis not present

## 2019-10-21 DIAGNOSIS — Z811 Family history of alcohol abuse and dependence: Secondary | ICD-10-CM

## 2019-10-21 DIAGNOSIS — Z951 Presence of aortocoronary bypass graft: Secondary | ICD-10-CM | POA: Diagnosis not present

## 2019-10-21 DIAGNOSIS — N138 Other obstructive and reflux uropathy: Secondary | ICD-10-CM | POA: Diagnosis present

## 2019-10-21 DIAGNOSIS — Z818 Family history of other mental and behavioral disorders: Secondary | ICD-10-CM

## 2019-10-21 DIAGNOSIS — N390 Urinary tract infection, site not specified: Secondary | ICD-10-CM | POA: Diagnosis not present

## 2019-10-21 DIAGNOSIS — R404 Transient alteration of awareness: Secondary | ICD-10-CM | POA: Diagnosis not present

## 2019-10-21 LAB — CBC
HCT: 42.5 % (ref 39.0–52.0)
Hemoglobin: 13.8 g/dL (ref 13.0–17.0)
MCH: 28.9 pg (ref 26.0–34.0)
MCHC: 32.5 g/dL (ref 30.0–36.0)
MCV: 89.1 fL (ref 80.0–100.0)
Platelets: 361 10*3/uL (ref 150–400)
RBC: 4.77 MIL/uL (ref 4.22–5.81)
RDW: 15.4 % (ref 11.5–15.5)
WBC: 11.5 10*3/uL — ABNORMAL HIGH (ref 4.0–10.5)
nRBC: 0 % (ref 0.0–0.2)

## 2019-10-21 LAB — URINALYSIS, ROUTINE W REFLEX MICROSCOPIC
Bacteria, UA: NONE SEEN
Bilirubin Urine: NEGATIVE
Glucose, UA: NEGATIVE mg/dL
Ketones, ur: NEGATIVE mg/dL
Nitrite: POSITIVE — AB
Protein, ur: 100 mg/dL — AB
Specific Gravity, Urine: 1.006 (ref 1.005–1.030)
WBC, UA: >50 WBC/hpf — ABNORMAL HIGH (ref 0–5)
pH: 7 (ref 5.0–8.0)

## 2019-10-21 LAB — BASIC METABOLIC PANEL
Anion gap: 8 (ref 5–15)
BUN: 17 mg/dL (ref 8–23)
CO2: 24 mmol/L (ref 22–32)
Calcium: 8.7 mg/dL — ABNORMAL LOW (ref 8.9–10.3)
Chloride: 103 mmol/L (ref 98–111)
Creatinine, Ser: 1.23 mg/dL (ref 0.61–1.24)
GFR calc Af Amer: 60 mL/min — ABNORMAL LOW (ref >60)
GFR calc non Af Amer: 52 mL/min — ABNORMAL LOW (ref >60)
Glucose, Bld: 101 mg/dL — ABNORMAL HIGH (ref 70–99)
Potassium: 4.2 mmol/L (ref 3.5–5.1)
Sodium: 135 mmol/L (ref 135–145)

## 2019-10-21 LAB — SARS CORONAVIRUS 2 (TAT 6-24 HRS): SARS Coronavirus 2: NEGATIVE

## 2019-10-21 LAB — TSH: TSH: 3.256 u[IU]/mL (ref 0.350–4.500)

## 2019-10-21 MED ORDER — SODIUM CHLORIDE 0.9% FLUSH: 3.0000 mL | Freq: Once | INTRAVENOUS | Status: DC

## 2019-10-21 MED ORDER — AMIODARONE HCL 200 MG PO TABS
200.0000 mg | ORAL_TABLET | Freq: Every day | ORAL | Status: DC
  Administered 2019-10-21 – 2019-10-25 (×4): 200 mg via ORAL
  Filled 2019-10-21 (×4): qty 1

## 2019-10-21 MED ORDER — PRAVASTATIN SODIUM 10 MG PO TABS
20.0000 mg | ORAL_TABLET | Freq: Every day | ORAL | Status: DC
  Administered 2019-10-21 – 2019-10-24 (×4): 20 mg via ORAL
  Filled 2019-10-21 (×4): qty 2

## 2019-10-21 MED ORDER — DOCUSATE SODIUM 100 MG PO CAPS
100.0000 mg | ORAL_CAPSULE | Freq: Every day | ORAL | Status: DC
  Administered 2019-10-22 – 2019-10-25 (×4): 100 mg via ORAL
  Filled 2019-10-21 (×4): qty 1

## 2019-10-21 MED ORDER — ADULT MULTIVITAMIN W/MINERALS CH
1.0000 | ORAL_TABLET | Freq: Every day | ORAL | Status: DC
  Administered 2019-10-22 – 2019-10-24 (×3): 1 via ORAL
  Filled 2019-10-21 (×4): qty 1

## 2019-10-21 MED ORDER — TORSEMIDE 20 MG PO TABS: 20.0000 mg | ORAL_TABLET | Freq: Once | ORAL | Status: DC

## 2019-10-21 MED ORDER — ACETAMINOPHEN 500 MG PO TABS
500.0000 mg | ORAL_TABLET | Freq: Every day | ORAL | Status: DC | PRN
  Administered 2019-10-24: 500 mg via ORAL
  Filled 2019-10-21: qty 1

## 2019-10-21 MED ORDER — RISAQUAD PO CAPS
1.0000 | ORAL_CAPSULE | Freq: Every day | ORAL | Status: DC
  Administered 2019-10-22 – 2019-10-25 (×4): 1 via ORAL
  Filled 2019-10-21 (×4): qty 1

## 2019-10-21 MED ORDER — MECLIZINE HCL 25 MG PO TABS
25.0000 mg | ORAL_TABLET | Freq: Three times a day (TID) | ORAL | Status: DC | PRN
  Filled 2019-10-21: qty 1

## 2019-10-21 MED ORDER — SODIUM CHLORIDE 0.9 % IV SOLN: Freq: Once | INTRAVENOUS | Status: AC

## 2019-10-21 MED ORDER — HYDRALAZINE HCL 10 MG PO TABS
10.0000 mg | ORAL_TABLET | Freq: Three times a day (TID) | ORAL | Status: DC
  Administered 2019-10-21 – 2019-10-25 (×12): 10 mg via ORAL
  Filled 2019-10-21 (×14): qty 1

## 2019-10-21 MED ORDER — FERROUS SULFATE 325 (65 FE) MG PO TABS
325.0000 mg | ORAL_TABLET | Freq: Every day | ORAL | Status: DC
  Administered 2019-10-21 – 2019-10-24 (×4): 325 mg via ORAL
  Filled 2019-10-21 (×3): qty 1

## 2019-10-21 MED ORDER — POLYVINYL ALCOHOL 1.4 % OP SOLN
Freq: Every day | OPHTHALMIC | Status: DC | PRN
  Filled 2019-10-21: qty 15

## 2019-10-21 MED ORDER — CARBIDOPA-LEVODOPA 25-100 MG PO TABS
1.5000 | ORAL_TABLET | Freq: Three times a day (TID) | ORAL | Status: DC
  Administered 2019-10-21 – 2019-10-25 (×12): 1.5 via ORAL
  Filled 2019-10-21 (×4): qty 2
  Filled 2019-10-21: qty 1.5
  Filled 2019-10-21: qty 2
  Filled 2019-10-21: qty 1.5
  Filled 2019-10-21 (×7): qty 2

## 2019-10-21 MED ORDER — ENOXAPARIN SODIUM 40 MG/0.4ML ~~LOC~~ SOLN
40.0000 mg | SUBCUTANEOUS | Status: DC
  Administered 2019-10-21 – 2019-10-24 (×4): 40 mg via SUBCUTANEOUS
  Filled 2019-10-21 (×4): qty 0.4

## 2019-10-21 MED ORDER — ADULT MULTIVITAMIN W/MINERALS CH
1.0000 | ORAL_TABLET | Freq: Every day | ORAL | Status: DC
  Administered 2019-10-23 – 2019-10-25 (×2): 1 via ORAL
  Filled 2019-10-21 (×5): qty 1

## 2019-10-21 MED ORDER — SODIUM CHLORIDE 0.9 % IV SOLN
1.0000 g | INTRAVENOUS | Status: DC
  Administered 2019-10-22 – 2019-10-25 (×4): 1 g via INTRAVENOUS
  Filled 2019-10-21 (×4): qty 1

## 2019-10-21 MED ORDER — FINASTERIDE 5 MG PO TABS
5.0000 mg | ORAL_TABLET | Freq: Every day | ORAL | Status: DC
  Administered 2019-10-22 – 2019-10-25 (×4): 5 mg via ORAL
  Filled 2019-10-21 (×4): qty 1

## 2019-10-21 MED ORDER — LEVOTHYROXINE SODIUM 75 MCG PO TABS
75.0000 ug | ORAL_TABLET | Freq: Every day | ORAL | Status: DC
  Administered 2019-10-22 – 2019-10-25 (×4): 75 ug via ORAL
  Filled 2019-10-21 (×4): qty 1

## 2019-10-21 MED ORDER — LIDOCAINE 5 % EX OINT
TOPICAL_OINTMENT | Freq: Three times a day (TID) | CUTANEOUS | Status: DC | PRN
  Filled 2019-10-21: qty 35.44

## 2019-10-21 MED ORDER — SODIUM CHLORIDE 0.9 % IV SOLN
1.0000 g | Freq: Once | INTRAVENOUS | Status: AC
  Administered 2019-10-21: 13:00:00 1 g via INTRAVENOUS
  Filled 2019-10-21: qty 10

## 2019-10-21 MED ORDER — ASPIRIN EC 81 MG PO TBEC
81.0000 mg | DELAYED_RELEASE_TABLET | Freq: Every day | ORAL | Status: DC
  Administered 2019-10-21 – 2019-10-25 (×5): 81 mg via ORAL
  Filled 2019-10-21 (×5): qty 1

## 2019-10-21 MED ORDER — BUMETANIDE 0.5 MG PO TABS
0.5000 mg | ORAL_TABLET | Freq: Once | ORAL | Status: AC
  Administered 2019-10-21: 21:00:00 0.5 mg via ORAL
  Filled 2019-10-21: qty 1

## 2019-10-21 NOTE — ED Provider Notes (Signed)
[facility] EMERGENCY DEPARTMENT Provider Note   CSN: [id number] Arrival date & time: 10/21/19  [id number]     History Chief Complaint  Patient presents with  . Hallucinations  . urinary retention    Justin [name] is a 86 y.o. male.  Level 5 caveat secondary to some confusion.  History is primarily from the patient's wife.  She said he had a skin biopsy done 4 days ago under local anesthetic.  Since then he has been more weak, having difficulty standing and walking despite his walker.  Also having some hallucinations and confusion which she is seen before.  Also having some urinary frequency.  No known fevers cough chest pain abdominal pain.  No recent falls.  Last month had a medication change where he was taken off his amlodipine and Lasix was added which seemed to exacerbate some problems with him.  He is currently not taking the Lasix or the amlodipine.  The history is provided by the patient and the spouse.  Altered Mental Status Presenting symptoms: confusion   Severity:  Moderate Most recent episode:  Today Episode history:  Continuous Duration:  4 days Progression:  Waxing and waning Chronicity:  Recurrent Context: recent change in medication   Associated symptoms: bladder incontinence, hallucinations and weakness   Associated symptoms: no abdominal pain, no difficulty breathing, no fever, no headaches, no nausea, no rash, no slurred speech and no vomiting        Past Medical History:  Diagnosis Date  . Basal cell carcinoma   . BPH (benign prostatic hypertrophy)   . CAD (coronary artery disease) 1991   a. H/o MI w/ PTCA;  b. s/p CABG by Dr Roxan Hockey w/ LIMA-LAD, SVG-OM1-OM3, SVG-D1, SVG-PDA  . CHF (congestive heart failure) ([place])   . Complication of anesthesia    Wife reports history of confusion after last surgery in 10/2016-lasted a few days  . HTN (hypertension)   . Hyperlipidemia   . Internal hemorrhoids   . Moderate aortic stenosis    a.  08/2014 Echo: EF 55-60%, no rwma, Gr 1 DD, mod AS, mildly dil asc Ao, mild MR, sev dil LA.  . NSTEMI (non-ST elevated myocardial infarction) ([place]) 05/2017    PCI with DES to SVG-OM1. SVG-OM3 occluded, LIMA-LAD and SVG-RCA patent  . PAF (paroxysmal atrial fibrillation) ([place])    a. Dx 08/2014;  b. CHA2DS2VASc = 4 - decision made to withold [place] 2/2 unsteady gait and syncope.  . Sigmoid diverticulitis   . Syncope    a. 08/2014 - ? orthostatic    Patient Active Problem List   Diagnosis Date Noted  . Parkinson disease ([place]) 09/05/2018  . Hypothyroidism 08/25/2018  . Chronic combined systolic and diastolic CHF (congestive heart failure) ([place]) 08/25/2018  . Sundowning 06/24/2017  . Ischemic cardiomyopathy 06/24/2017  . Anticoagulated 06/24/2017  . Syncope   . Sigmoid diverticulitis   . Internal hemorrhoids   . HTN (hypertension)   . Complication of anesthesia   . CHF (congestive heart failure) ([place])   . Basal cell carcinoma   . NSTEMI (non-ST elevated myocardial infarction) ([place])   . Chest pain 06/10/2017  . Unstable angina ([place]) 06/10/2017  . UTI (urinary tract infection) 11/24/2016  . Sepsis ([place]) 11/24/2016  . Generalized weakness 11/24/2016  . Hyponatremia 11/24/2016  . BPH with urinary obstruction 11/04/2016  . H/O prostatectomy 11/04/2016  . Fall   . Closed left hip fracture ([place]) 10/13/2015  . Medicare annual wellness visit, subsequent 03/16/2015  .  Paroxysmal atrial fibrillation ([place]) 09/26/2014  . Vertigo 09/15/2014  . Head trauma 09/14/2014  . Alteration in anticoagulation 09/14/2014  . Tinnitus 03/21/2014  . Epigastric pain 02/01/2013  . Gait instability 01/01/2013  . Disequilibrium 04/03/2012  . Hyperglycemia 09/13/2011  . Aortic stenosis 03/10/2011  . CAD S/P percutaneous coronary angioplasty 02/23/2010  . Hyperlipidemia 07/04/2008  . Essential hypertension 07/04/2008  . RHINITIS 07/04/2008  . BPH (benign prostatic hyperplasia) 07/04/2008  . Hx of CABG 06/28/1989     Past Surgical History:  Procedure Laterality Date  . CARDIAC CATHETERIZATION    . COLONOSCOPY    . CORONARY ARTERY BYPASS GRAFT  2000   Dr Roxan Hockey  LIMA-LAD, SVG-OM1-OM3, SVG-D1, SVG-PDA.   [name] CORONARY STENT INTERVENTION N/A 06/13/2017   Procedure: CORONARY STENT INTERVENTION;  Surgeon: Lorretta Harp, MD;  Location: [facility];  Service: Cardiovascular;  Laterality: N/A;  . CYSTOSCOPY N/A 12/07/2016   Procedure: CYSTOSCOPY;  Surgeon: Cleon Gustin, MD;  Location: [facility];  Service: Urology;  Laterality: N/A;  NEEDS 30 MIN TOTAL FOR SURGERY  . CYSTOSCOPY WITH URETHRAL DILATATION N/A 02/21/2017   Procedure: DILATATION OF BLADDER NECK WITH INJECTION OF MITOMYCIN C;  Surgeon: Cleon Gustin, MD;  Location: [facility];  Service: Urology;  Laterality: N/A;  . EYE SURGERY     bil cataract  . HIP ARTHROPLASTY Left 10/14/2015   Procedure: LEFT HIP HEMI ARTHROPLASTY;  Surgeon: [name], MD;  Location: [facility];  Service: Orthopedics;  Laterality: Left;  . LEFT HEART CATH AND CORS/GRAFTS ANGIOGRAPHY N/A 06/13/2017   Procedure: LEFT HEART CATH AND CORS/GRAFTS ANGIOGRAPHY;  Surgeon: Lorretta Harp, MD;  Location: [facility];  Service: Cardiovascular;  Laterality: N/A;  . SHOULDER SURGERY     x2  . TONSILLECTOMY    . TRANSTHORACIC ECHOCARDIOGRAM  10-06-2016   dr Stanford Breed   mild LVH, ef 40-45%, diffuse hypokinesis/ moderate AV calcification leaflets and annulus,  severe thickened leaflets, mod.-sev. aortic stenosis (Valva area 0.61cm^2, mean grandient 54mmHg, peak gradiant 23mmHg)/  moderate thickened , mild calcified MV leaflets without stenosis, moderate MR/  severe LAE/ trivial PR and TR  . TRANSURETHRAL RESECTION OF BLADDER NECK N/A 12/07/2016   Procedure: TRANSURETHRAL RESECTION OF BLADDER NECK;  Surgeon: Cleon Gustin, MD;  Location: [facility];  Service: Urology;  Laterality: N/A;  . XI ROBOTIC ASSISTED SIMPLE PROSTATECTOMY N/A 11/04/2016   Procedure: XI  ROBOTIC ASSISTED SIMPLE PROSTATECTOMY;  Surgeon: Cleon Gustin, MD;  Location: [facility];  Service: Urology;  Laterality: N/A;       Family History  Problem Relation Age of Onset  . Coronary artery disease Father   . Hyperlipidemia Father   . Heart attack Father   . Alcohol abuse Father   . Bipolar disorder Mother   . Heart disease Mother   . Alcohol abuse Brother   . Cancer Brother   . Cancer Maternal Grandfather        oral cancer, chew tobacco  . Cancer Sister   . Glaucoma Sister   . Alcohol abuse Other     Social History   Tobacco Use  . Smoking status: Former Smoker    Packs/day: 0.50    Years: 15.00    Pack years: 7.50    Quit date: 06/29/1971    Years since quitting: 48.3  . Smokeless tobacco: Never Used  Substance Use Topics  . Alcohol use: Yes    Alcohol/week: 1.0 - 2.0 standard drinks    Types:  1 - 2 Glasses of wine per week    Comment: alternates wine and liquor. 1/2 pint liquor/week  . Drug use: No    Home Medications Prior to Admission medications   Medication Sig Start Date End Date Taking? Authorizing Provider  acetaminophen (TYLENOL) 500 MG tablet Take 500 mg by mouth daily as needed for moderate pain.    [provider]  amiodarone (PACERONE) 200 MG tablet TAKE ONE (1) TABLET BY MOUTH EVERY DAY 08/13/19   Lelon Perla, MD  aspirin EC 81 MG tablet Take 1 tablet (81 mg total) by mouth daily. 09/05/18   Erlene Quan, PA-C  beta carotene w/minerals (OCUVITE) tablet Take 1 tablet by mouth daily after breakfast.     [provider]  carbidopa-levodopa (SINEMET IR) 25-100 MG tablet Take 1.5 tablets by mouth 3 (three) times daily. 06/11/19   Pieter Partridge, DO  Carboxymethylcellulose Sodium (THERATEARS OP) Place 1 drop into both eyes daily as needed (dry eyes).     [provider]  docusate sodium (COLACE) 100 MG capsule Take 100 mg by mouth daily after breakfast.     [provider]  ferrous sulfate 325 (65 FE) MG  tablet Take 325 mg by mouth daily after supper.     [provider]  finasteride (PROSCAR) 5 MG tablet Take 5 mg by mouth daily after breakfast.     [provider]  levothyroxine (SYNTHROID) 75 MCG tablet TAKE 1 TABLET BY MOUTH DAILY BEFORE BREAKFAST 01/25/19   Lelon Perla, MD  lovastatin (MEVACOR) 20 MG tablet TAKE 1 TABLET DAILY AT 6PM 06/28/19   Lelon Perla, MD  meclizine (ANTIVERT) 25 MG tablet Take 1 tablet (25 mg total) by mouth 3 (three) times daily as needed for dizziness. 08/05/16   Fredia Sorrow, MD  Multiple Vitamin (MULTIVITAMIN WITH MINERALS) TABS tablet Take 1 tablet by mouth daily after breakfast.     [provider]  nitroGLYCERIN (NITROSTAT) 0.4 MG SL tablet Place 1 tablet (0.4 mg total) under the tongue every 5 (five) minutes as needed for chest pain. 06/14/18   Lelon Perla, MD  Probiotic Product (PROBIOTIC DAILY PO) Take 1 capsule by mouth daily after breakfast.     [provider]    Allergies    Morphine, Morphine and related, Diltiazem hcl, Metoprolol, Toradol [ketorolac tromethamine], Hydrocodone, Sulfa antibiotics, and Dutasteride  Review of Systems   Review of Systems  Constitutional: Negative for fever.  HENT: Negative for sore throat.   Eyes: Negative for visual disturbance.  Respiratory: Negative for shortness of breath.   Cardiovascular: Negative for chest pain.  Gastrointestinal: Negative for abdominal pain, nausea and vomiting.  Genitourinary: Positive for bladder incontinence and frequency. Negative for dysuria.  Musculoskeletal: Positive for gait problem.  Skin: Negative for rash.  Neurological: Positive for weakness. Negative for headaches.  Psychiatric/Behavioral: Positive for confusion and hallucinations.    Physical Exam Updated Vital Signs BP (!) 181/98 (BP Location: Left Arm)   Pulse 68   Temp 97.6 F (36.4 C) (Oral)   Resp 16   SpO2 99%   Physical Exam Vitals and nursing note reviewed.   Constitutional:      Appearance: Normal appearance. He is well-developed.  HENT:     Head: Normocephalic.     Comments: He has some sutures to his right temporal area and a few superficial ulcerations Eyes:     Conjunctiva/sclera: Conjunctivae normal.     Comments: He has some puffiness to  his eyelids.  Cardiovascular:     Rate and Rhythm: Normal rate and regular rhythm.     Pulses: Normal pulses.     Heart sounds: Murmur present.  Pulmonary:     Effort: Pulmonary effort is normal. No respiratory distress.     Breath sounds: Normal breath sounds.  Abdominal:     Palpations: Abdomen is soft.     Tenderness: There is no abdominal tenderness.  Musculoskeletal:        General: No deformity or signs of injury. Normal range of motion.     Cervical back: Neck supple.  Skin:    General: Skin is warm and dry.     Capillary Refill: Capillary refill takes less than 2 seconds.  Neurological:     General: No focal deficit present.     Mental Status: He is alert.     Comments: Patient is awake although appears very tired.  He is following commands and has no slurred speech.  Moving everything nonfocal he.  Gait is deferred but wife says he is very unsteady and difficult to move which is worse than his typical gait with his Parkinson's.     ED Results / Procedures / Treatments   Labs (all labs ordered are listed, but only abnormal results are displayed) Labs Reviewed  BASIC METABOLIC PANEL - Abnormal; Notable for the following components:      Result Value   Glucose, Bld 101 (*)    Calcium 8.7 (*)    GFR calc non Af Amer 52 (*)    GFR calc Af Amer 60 (*)    All other components within normal limits  CBC - Abnormal; Notable for the following components:   WBC 11.5 (*)    All other components within normal limits  URINALYSIS, ROUTINE W REFLEX MICROSCOPIC - Abnormal; Notable for the following components:   APPearance TURBID (*)    Hgb urine dipstick MODERATE (*)    Protein, ur 100 (*)     Nitrite POSITIVE (*)    Leukocytes,Ua LARGE (*)    WBC, UA >50 (*)    All other components within normal limits  CULTURE, BLOOD (ROUTINE X 2)  CULTURE, BLOOD (ROUTINE X 2)  SARS CORONAVIRUS 2 (TAT 6-24 HRS)  URINE CULTURE  TSH  BASIC METABOLIC PANEL  CBC    EKG EKG Interpretation  Date/Time:  Sunday October 21 2019 11:21:47 EDT Ventricular Rate:  67 PR Interval:  196 QRS Duration: 133 QT Interval:  439 QTC Calculation: 464 R Axis:   -52 Text Interpretation: Sinus rhythm Consider left atrial enlargement IVCD, consider atypical RBBB LVH with IVCD, LAD and secondary repol abnrm similar to prior today Confirmed by Aletta Edouard [phone]) on 10/21/2019 11:55:01 AM   Radiology CT Head Wo Contrast  Result Date: 10/21/2019 CLINICAL DATA:  In cephalopathy, hallucinations EXAM: CT HEAD WITHOUT CONTRAST TECHNIQUE: Contiguous axial images were obtained from the base of the skull through the vertex without intravenous contrast. COMPARISON:  08/13/2018 FINDINGS: Brain: No evidence of acute infarction, hemorrhage, hydrocephalus, extra-axial collection or mass lesion/mass effect. Extensive periventricular and deep white matter hypodensity with global volume loss. Vascular: No hyperdense vessel or unexpected calcification. Skull: Normal. Negative for fracture or focal lesion. Sinuses/Orbits: No acute finding. Other: None. IMPRESSION: No acute intracranial pathology. Small-vessel white matter disease and global volume loss in keeping with advanced patient age. Electronically Signed   By: Eddie Candle M.D.   On: 10/21/2019 14:18   DG Chest [facility] 1 View  Result  Date: 10/21/2019 CLINICAL DATA:  Weakness, hallucinations. EXAM: PORTABLE CHEST 1 VIEW COMPARISON:  10/02/2019 FINDINGS: Cardiomediastinal contours and hilar structures are unchanged. The cardiac enlargement following median sternotomy for CABG and calcified lymph nodes about the mediastinum similar to the prior study. Lungs are clear. No  consolidation. No pleural effusion. Marked glenohumeral degenerative changes worse on the LEFT. IMPRESSION: No acute cardiopulmonary disease. Signs of granulomatous disease with calcified lymph nodes in the mediastinum and hila. Electronically Signed   By: Zetta Bills M.D.   On: 10/21/2019 13:02    Procedures Procedures (including critical care time)  Medications Ordered in ED Medications  sodium chloride flush (NS) 0.9 % injection 3 mL (has no administration in time range)  cefTRIAXone (ROCEPHIN) 1 g in sodium chloride 0.9 % 100 mL IVPB (has no administration in time range)  0.9 %  sodium chloride infusion (has no administration in time range)    ED Course  I have reviewed the triage vital signs and the nursing notes.  Pertinent labs & imaging results that were available during my care of the patient were reviewed by me and considered in my medical decision making (see chart for details).  Clinical Course as of Oct 21 2031  Sun Oct 21, 2019  1305 Chest x-ray interpreted by me as with no gross infiltrates.  Has some calcified nodules.  Awaiting radiology reading.   [MB]  1351 Head CT showing some calcifications and some atrophy.  Interpreted by me, awaiting radiology reading.   [MB]  1409 Discussed with Dr. Roosevelt Locks from [facility] hospitalist who will evaluate the patient for admission.   [MB]    Clinical Course User Index [MB] Hayden Rasmussen, MD   MDM Rules/Calculators/A&P                     This patient complains of weakness and hallucinations; this involves an extensive number of treatment Options and is a complaint that carries with it a high risk of complications and Morbidity. The differential includes infection, stroke, metabolic derangement  I ordered, reviewed and interpreted labs, which included CBC which shows an elevated white count of 11.5.  Normal hemoglobin.  Chemistry fairly unremarkable mildly low calcium of 8.7.  Urinalysis appears infected with nitrite +20 1-50  reds and greater than 50 whites.  We will send off blood cultures urine culture. I ordered medication ceftriaxone and IV fluids I ordered imaging studies which included chest x-ray and head CT and I independently    visualized and interpreted imaging which showed no acute changes Additional history obtained from wife Previous records obtained and reviewed in epic I consulted [facility] hospitalist Dr. Roosevelt Locks and discussed lab and imaging findings  Critical Interventions: None  After the interventions stated above, I reevaluated the patient and found to be resting comfortably.  Antibiotics are infusing.  I updated the wife and the patient on the results.  They are in agreement that he would benefit from being admitted so we can get his strength improved.  Final Clinical Impression(s) / ED Diagnoses Final diagnoses:  Encephalopathy acute  Lower urinary tract infectious disease    Rx / DC Orders ED Discharge Orders    None       Hayden Rasmussen, MD 10/21/19 2034

## 2019-10-21 NOTE — H&P (Signed)
History and Physical    Justin [name] [id number] DOB: 12/31/1928 DOA: 10/21/2019  PCP: Mosie Lukes, MD  Patient coming from: Home  Chief Complaint: Feeling weak and confused  HPI: Justin [name] is a 86 y.o. male with medical history significant of BPH with recurrent UTIs, CAD, severe aortic stenosis, Parkinson's disease, borderline systolic CHF with EF 40 to 45%, basal cell carcinomas, presented with new onset weakness and confusion.  Patient underwent Mohs procedure and nitrogen treatment of scalp basal cells last week, and has been feeling weak since then.  Denied any fever chills, no dysuria, but patient wife noticed patient has increased urinary frequency.  Patient denied any chest pain or short of breath.  He continues to complain about the pain on the scalp incision sites. ED Course: UA showed UTI.  Review of Systems: As per HPI otherwise 10 point review of systems negative.    Past Medical History:  Diagnosis Date  . Basal cell carcinoma   . BPH (benign prostatic hypertrophy)   . CAD (coronary artery disease) 1991   a. H/o MI w/ PTCA;  b. s/p CABG by Dr Roxan Hockey w/ LIMA-LAD, SVG-OM1-OM3, SVG-D1, SVG-PDA  . CHF (congestive heart failure) ([employer])   . Complication of anesthesia    Wife reports history of confusion after last surgery in 10/2016-lasted a few days  . HTN (hypertension)   . Hyperlipidemia   . Internal hemorrhoids   . Moderate aortic stenosis    a. 08/2014 Echo: EF 55-60%, no rwma, Gr 1 DD, mod AS, mildly dil asc Ao, mild MR, sev dil LA.  . NSTEMI (non-ST elevated myocardial infarction) ([place]) 05/2017    PCI with DES to SVG-OM1. SVG-OM3 occluded, LIMA-LAD and SVG-RCA patent  . PAF (paroxysmal atrial fibrillation) ([place])    a. Dx 08/2014;  b. CHA2DS2VASc = 4 - decision made to withold [place] 2/2 unsteady gait and syncope.  . Sigmoid diverticulitis   . Syncope    a. 08/2014 - ? orthostatic    Past Surgical History:  Procedure Laterality Date  . CARDIAC  CATHETERIZATION    . COLONOSCOPY    . CORONARY ARTERY BYPASS GRAFT  2000   Dr Roxan Hockey  LIMA-LAD, SVG-OM1-OM3, SVG-D1, SVG-PDA.   [name] CORONARY STENT INTERVENTION N/A 06/13/2017   Procedure: CORONARY STENT INTERVENTION;  Surgeon: Lorretta Harp, MD;  Location: [facility];  Service: Cardiovascular;  Laterality: N/A;  . CYSTOSCOPY N/A 12/07/2016   Procedure: CYSTOSCOPY;  Surgeon: Cleon Gustin, MD;  Location: [facility];  Service: Urology;  Laterality: N/A;  NEEDS 30 MIN TOTAL FOR SURGERY  . CYSTOSCOPY WITH URETHRAL DILATATION N/A 02/21/2017   Procedure: DILATATION OF BLADDER NECK WITH INJECTION OF MITOMYCIN C;  Surgeon: Cleon Gustin, MD;  Location: [facility];  Service: Urology;  Laterality: N/A;  . EYE SURGERY     bil cataract  . HIP ARTHROPLASTY Left 10/14/2015   Procedure: LEFT HIP HEMI ARTHROPLASTY;  Surgeon: [name], MD;  Location: [facility];  Service: Orthopedics;  Laterality: Left;  . LEFT HEART CATH AND CORS/GRAFTS ANGIOGRAPHY N/A 06/13/2017   Procedure: LEFT HEART CATH AND CORS/GRAFTS ANGIOGRAPHY;  Surgeon: Lorretta Harp, MD;  Location: [facility];  Service: Cardiovascular;  Laterality: N/A;  . SHOULDER SURGERY     x2  . TONSILLECTOMY    . TRANSTHORACIC ECHOCARDIOGRAM  10-06-2016   dr Stanford Breed   mild LVH, ef 40-45%, diffuse hypokinesis/ moderate AV calcification leaflets and annulus,  severe thickened leaflets, mod.-sev. aortic  stenosis (Valva area 0.61cm^2, mean grandient 1mmHg, peak gradiant 55mmHg)/  moderate thickened , mild calcified MV leaflets without stenosis, moderate MR/  severe LAE/ trivial PR and TR  . TRANSURETHRAL RESECTION OF BLADDER NECK N/A 12/07/2016   Procedure: TRANSURETHRAL RESECTION OF BLADDER NECK;  Surgeon: Cleon Gustin, MD;  Location: [facility];  Service: Urology;  Laterality: N/A;  . XI ROBOTIC ASSISTED SIMPLE PROSTATECTOMY N/A 11/04/2016   Procedure: XI ROBOTIC ASSISTED SIMPLE PROSTATECTOMY;  Surgeon: Cleon Gustin, MD;   Location: [facility];  Service: Urology;  Laterality: N/A;     reports that he quit smoking about 48 years ago. He has a 7.50 pack-year smoking history. He has never used smokeless tobacco. He reports current alcohol use of about 1.0 - 2.0 standard drinks of alcohol per week. He reports that he does not use drugs.  Allergies  Allergen Reactions  . Morphine Other (See Comments)    Severe confusion  . Morphine And Related Other (See Comments)    Severe confusion  . Diltiazem Hcl Other (See Comments)    bradycardia  . Metoprolol Other (See Comments)    bradycardia  . Toradol [Ketorolac Tromethamine] Other (See Comments)    Dizzy and light headed  . Hydrocodone Other (See Comments)    hallucinations  . Sulfa Antibiotics Other (See Comments)    Caused extreme weakness/low sodium  Feb. 2020 per wife Caused extreme weakness/low sodium  Feb. 2020 per wife  . Dutasteride Diarrhea    GI upset and incontinence    Family History  Problem Relation Age of Onset  . Coronary artery disease Father   . Hyperlipidemia Father   . Heart attack Father   . Alcohol abuse Father   . Bipolar disorder Mother   . Heart disease Mother   . Alcohol abuse Brother   . Cancer Brother   . Cancer Maternal Grandfather        oral cancer, chew tobacco  . Cancer Sister   . Glaucoma Sister   . Alcohol abuse Other      Prior to Admission medications   Medication Sig Start Date End Date Taking? Authorizing Provider  acetaminophen (TYLENOL) 500 MG tablet Take 500 mg by mouth daily as needed for moderate pain.    [provider]  amiodarone (PACERONE) 200 MG tablet TAKE ONE (1) TABLET BY MOUTH EVERY DAY 08/13/19   Lelon Perla, MD  aspirin EC 81 MG tablet Take 1 tablet (81 mg total) by mouth daily. 09/05/18   Erlene Quan, PA-C  beta carotene w/minerals (OCUVITE) tablet Take 1 tablet by mouth daily after breakfast.     [provider]  carbidopa-levodopa (SINEMET IR) 25-100 MG tablet Take  1.5 tablets by mouth 3 (three) times daily. 06/11/19   Pieter Partridge, DO  Carboxymethylcellulose Sodium (THERATEARS OP) Place 1 drop into both eyes daily as needed (dry eyes).     [provider]  docusate sodium (COLACE) 100 MG capsule Take 100 mg by mouth daily after breakfast.     [provider]  ferrous sulfate 325 (65 FE) MG tablet Take 325 mg by mouth daily after supper.     [provider]  finasteride (PROSCAR) 5 MG tablet Take 5 mg by mouth daily after breakfast.     [provider]  levothyroxine (SYNTHROID) 75 MCG tablet TAKE 1 TABLET BY MOUTH DAILY BEFORE BREAKFAST 01/25/19   Lelon Perla, MD  lovastatin (MEVACOR) 20 MG tablet TAKE 1 TABLET DAILY  AT 6PM 06/28/19   Lelon Perla, MD  meclizine (ANTIVERT) 25 MG tablet Take 1 tablet (25 mg total) by mouth 3 (three) times daily as needed for dizziness. 08/05/16   Fredia Sorrow, MD  Multiple Vitamin (MULTIVITAMIN WITH MINERALS) TABS tablet Take 1 tablet by mouth daily after breakfast.     [provider]  nitroGLYCERIN (NITROSTAT) 0.4 MG SL tablet Place 1 tablet (0.4 mg total) under the tongue every 5 (five) minutes as needed for chest pain. 06/14/18   Lelon Perla, MD  Probiotic Product (PROBIOTIC DAILY PO) Take 1 capsule by mouth daily after breakfast.     [provider]    Physical Exam: Vitals:   10/21/19 1200 10/21/19 1230 10/21/19 1400 10/21/19 1430  BP: (!) 172/89 (!) 177/86 (!) 159/85 (!) 162/95  Pulse: 60 (!) 52 (!) 56 61  Resp: 15 12 13 19   Temp:      TempSrc:      SpO2: 98% 98% 97% 98%    Constitutional: NAD, calm, comfortable Vitals:   10/21/19 1200 10/21/19 1230 10/21/19 1400 10/21/19 1430  BP: (!) 172/89 (!) 177/86 (!) 159/85 (!) 162/95  Pulse: 60 (!) 52 (!) 56 61  Resp: 15 12 13 19   Temp:      TempSrc:      SpO2: 98% 98% 97% 98%   Eyes: PERRL, eye lids swelling ENMT: Mucous membranes are moist. Posterior pharynx clear of any exudate or  lesions.Normal dentition.  Neck: normal, supple, no masses, no thyromegaly Respiratory: clear to auscultation bilaterally, no wheezing, no crackles. Normal respiratory effort. No accessory muscle use.  Cardiovascular: Regular rate and rhythm, loud murmur heart base.  1+ extremity edema. 2+ pedal pulses. No carotid bruits.  Abdomen: no tenderness, no masses palpated. No hepatosplenomegaly. Bowel sounds positive.  Musculoskeletal: no clubbing / cyanosis. No joint deformity upper and lower extremities. Good ROM, no contractures. Normal muscle tone.  Skin: no rashes, lesions, ulcers. No induration Neurologic: CN 2-12 grossly intact. Sensation intact, DTR normal. Strength 5/5 in all 4.  Psychiatric: Normal judgment and insight. Alert and oriented x 3. Normal mood.     Labs on Admission: I have personally reviewed following labs and imaging studies  CBC: Recent Labs  Lab 10/21/19 1003  WBC 11.5*  HGB 13.8  HCT 42.5  MCV 89.1  PLT [id number]   Basic Metabolic Panel: Recent Labs  Lab 10/21/19 1003  NA 135  K 4.2  CL 103  CO2 24  GLUCOSE 101*  BUN 17  CREATININE 1.23  CALCIUM 8.7*   GFR: Estimated Creatinine Clearance: 35.4 mL/min (by C-G formula based on SCr of 1.23 mg/dL). Liver Function Tests: No results for input(s): AST, ALT, ALKPHOS, BILITOT, PROT, ALBUMIN in the last 168 hours. No results for input(s): LIPASE, AMYLASE in the last 168 hours. No results for input(s): AMMONIA in the last 168 hours. Coagulation Profile: No results for input(s): INR, PROTIME in the last 168 hours. Cardiac Enzymes: No results for input(s): CKTOTAL, CKMB, CKMBINDEX, TROPONINI in the last 168 hours. BNP (last 3 results) No results for input(s): PROBNP in the last 8760 hours. HbA1C: No results for input(s): HGBA1C in the last 72 hours. CBG: No results for input(s): GLUCAP in the last 168 hours. Lipid Profile: No results for input(s): CHOL, HDL, LDLCALC, TRIG, CHOLHDL, LDLDIRECT in the last 72  hours. Thyroid Function Tests: No results for input(s): TSH, T4TOTAL, FREET4, T3FREE, THYROIDAB in the last 72 hours. Anemia Panel: No results for input(s): VITAMINB12,  FOLATE, FERRITIN, TIBC, IRON, RETICCTPCT in the last 72 hours. Urine analysis:    Component Value Date/Time   COLORURINE YELLOW 10/21/2019 1113   APPEARANCEUR TURBID (A) 10/21/2019 1113   LABSPEC 1.006 10/21/2019 1113   PHURINE 7.0 10/21/2019 1113   GLUCOSEU NEGATIVE 10/21/2019 1113   GLUCOSEU NEG mg/dL 02/16/2010 0000   HGBUR MODERATE (A) 10/21/2019 1113   BILIRUBINUR NEGATIVE 10/21/2019 1113   BILIRUBINUR negative 07/24/2018 1502   KETONESUR NEGATIVE 10/21/2019 1113   PROTEINUR 100 (A) 10/21/2019 1113   UROBILINOGEN negative (A) 07/24/2018 1502   UROBILINOGEN 0.2 09/13/2014 1634   NITRITE POSITIVE (A) 10/21/2019 1113   LEUKOCYTESUR LARGE (A) 10/21/2019 1113    Radiological Exams on Admission: CT Head Wo Contrast  Result Date: 10/21/2019 CLINICAL DATA:  In cephalopathy, hallucinations EXAM: CT HEAD WITHOUT CONTRAST TECHNIQUE: Contiguous axial images were obtained from the base of the skull through the vertex without intravenous contrast. COMPARISON:  08/13/2018 FINDINGS: Brain: No evidence of acute infarction, hemorrhage, hydrocephalus, extra-axial collection or mass lesion/mass effect. Extensive periventricular and deep white matter hypodensity with global volume loss. Vascular: No hyperdense vessel or unexpected calcification. Skull: Normal. Negative for fracture or focal lesion. Sinuses/Orbits: No acute finding. Other: None. IMPRESSION: No acute intracranial pathology. Small-vessel white matter disease and global volume loss in keeping with advanced patient age. Electronically Signed   By: Eddie Candle M.D.   On: 10/21/2019 14:18   DG Chest Port 1 View  Result Date: 10/21/2019 CLINICAL DATA:  Weakness, hallucinations. EXAM: PORTABLE CHEST 1 VIEW COMPARISON:  10/02/2019 FINDINGS: Cardiomediastinal contours and hilar  structures are unchanged. The cardiac enlargement following median sternotomy for CABG and calcified lymph nodes about the mediastinum similar to the prior study. Lungs are clear. No consolidation. No pleural effusion. Marked glenohumeral degenerative changes worse on the LEFT. IMPRESSION: No acute cardiopulmonary disease. Signs of granulomatous disease with calcified lymph nodes in the mediastinum and hila. Electronically Signed   By: Zetta Bills M.D.   On: 10/21/2019 13:02    EKG: Independently reviewed.  Normal sinus, chronic RBBB, no acute ST-T changes  Assessment/Plan Active Problems:   Acute lower UTI   Impaired ambulation  Acute metabolic encephalopathy secondary to UTI Likely from BPH, given patient's age and other comorbidities, will treat conservatively with antibiotics only, check PVR, continue finasteride  Uncontrolled hypertension Patient cardiologist recently discontinue amlodipine, and start patient on Lasix.  However patient did not tolerate Lasix.  His cardiologist has not advised patient start on any new BP meds. As of now, patient has signs of mild fluid overload with peripheral edema, will give 1 dose of p.o. Bumex, start hydralazine, may need a new outpatient BP regimen to go home.  Chronic systolic CHF Overall managed well, no short of breath, has mild fluid overload, 1 dose of Bumex and reevaluate in a.m.  Parkinson disease Fairly controlled, continue current Sinemet dosage  Generalized weakness Likely from UTI, antibiotics and PT evaluation, probably will need rehab.  Aortic stenosis As per cardiology, conservative management, avoid overdiuresis.   DVT prophylaxis: Lovenox Code Status: DNR Family Communication: Wife at bedside Disposition Plan: MedSurg, PT evaluation, likely will stay in the hospital for more than 2 midnights for UTI treatment and PT evaluation Consults called: *None Admission status: MedSurg admit   Lequita Halt MD [facility] Pager [phone]    10/21/2019, 3:01 PM

## 2019-10-21 NOTE — ED Notes (Signed)
Patient transported to CT 

## 2019-10-21 NOTE — Progress Notes (Signed)
Patient arrived from ED to 6N22 via hospital bed with ongoing IVF NS at 100, AOx4, VSS, denies pain, oriented to room, use of call bell and informed about visitation policy. Due meds given, will continue to monitor and endorse accordingly.

## 2019-10-21 NOTE — ED Notes (Signed)
Pt transported to CT ?

## 2019-10-21 NOTE — ED Notes (Signed)
hospitalist at bedside

## 2019-10-21 NOTE — ED Triage Notes (Signed)
Pt from [facility].  He had skin CA removed from head on Thursday.  Wife reports increased hallucinations, urine dark and cloudy, and generalized weakness.

## 2019-10-22 ENCOUNTER — Ambulatory Visit: Payer: PPO | Admitting: Neurology

## 2019-10-22 DIAGNOSIS — A419 Sepsis, unspecified organism: Secondary | ICD-10-CM

## 2019-10-22 DIAGNOSIS — G9340 Encephalopathy, unspecified: Secondary | ICD-10-CM

## 2019-10-22 DIAGNOSIS — R262 Difficulty in walking, not elsewhere classified: Secondary | ICD-10-CM

## 2019-10-22 DIAGNOSIS — R6520 Severe sepsis without septic shock: Secondary | ICD-10-CM

## 2019-10-22 DIAGNOSIS — N390 Urinary tract infection, site not specified: Secondary | ICD-10-CM

## 2019-10-22 LAB — BASIC METABOLIC PANEL
Anion gap: 12 (ref 5–15)
BUN: 13 mg/dL (ref 8–23)
CO2: 22 mmol/L (ref 22–32)
Calcium: 8.8 mg/dL — ABNORMAL LOW (ref 8.9–10.3)
Chloride: 103 mmol/L (ref 98–111)
Creatinine, Ser: 1.15 mg/dL (ref 0.61–1.24)
GFR calc Af Amer: >60 mL/min (ref >60)
GFR calc non Af Amer: 56 mL/min — ABNORMAL LOW (ref >60)
Glucose, Bld: 93 mg/dL (ref 70–99)
Potassium: 4 mmol/L (ref 3.5–5.1)
Sodium: 137 mmol/L (ref 135–145)

## 2019-10-22 LAB — CBC
HCT: 42.6 % (ref 39.0–52.0)
Hemoglobin: 14 g/dL (ref 13.0–17.0)
MCH: 28.7 pg (ref 26.0–34.0)
MCHC: 32.9 g/dL (ref 30.0–36.0)
MCV: 87.5 fL (ref 80.0–100.0)
Platelets: 370 10*3/uL (ref 150–400)
RBC: 4.87 MIL/uL (ref 4.22–5.81)
RDW: 15.2 % (ref 11.5–15.5)
WBC: 12.9 10*3/uL — ABNORMAL HIGH (ref 4.0–10.5)
nRBC: 0 % (ref 0.0–0.2)

## 2019-10-22 MED ORDER — AQUAPHOR EX OINT
TOPICAL_OINTMENT | CUTANEOUS | Status: DC | PRN
  Filled 2019-10-22: qty 50

## 2019-10-22 MED ORDER — WHITE PETROLATUM EX OINT
TOPICAL_OINTMENT | CUTANEOUS | Status: DC | PRN
  Administered 2019-10-22: 0.2 via TOPICAL
  Filled 2019-10-22: qty 28.35

## 2019-10-22 MED ORDER — AMIODARONE IV BOLUS ONLY 150 MG/100ML
150.0000 mg | Freq: Once | INTRAVENOUS | Status: AC
  Administered 2019-10-22: 150 mg via INTRAVENOUS
  Filled 2019-10-22: qty 100

## 2019-10-22 NOTE — Progress Notes (Signed)
On call paged again regarding conversion to AFIB. Heart rate now sustaining 115-120. Faraz Ponciano RN

## 2019-10-22 NOTE — Progress Notes (Signed)
Tele notified RN that patient concerted back to NSR at 0612 this morning. MD notified. Will continue to monitor patient.

## 2019-10-22 NOTE — Progress Notes (Signed)
On call physician paged in regards to patient converting to AFIB.  Kameah Rawl RN

## 2019-10-22 NOTE — Progress Notes (Signed)
Patient became confused and agitated pulling his tele, and trying to get out of the bed. MD notified and received order for tele sitter monitoring. Patient's wife was called to redirect her husband before he agrees to take his medications. Wife came back to sit with the patient who became calm after her arrival. Will continue to monitor patient.

## 2019-10-22 NOTE — Progress Notes (Signed)
Patient has been very agitated and confused through out the night. Patients wife stated that he does have sundowners. Patient has attempted removal of purwic, condom cath, and clothes. Patient has also attempted to get out of bed multiple times. Patient was placed in mittens that he can remove, patient was placed in low bed and bed mats were placed next to bed.  Will continue to monitor [facility] RN

## 2019-10-22 NOTE — Evaluation (Signed)
Physical Therapy Evaluation Patient Details Name: Justin [name] MRN: [id number] DOB: 04-08-1929 Today's Date: 10/22/2019   History of Present Illness  Patient is a 86 y/o male who presents with confusion, urinary frequency and weakness. Admitted with UTI. PMH includes CAd s/p CABG 2000, CHF, BPH with recurrent UTIs, basal cell carcinoma, HTN, paroxysmal A-fib.  Clinical Impression  Patient presents with generalized weakness, impaired cognition, impaired balance and impaired mobility s/p above. Pt oriented to self only. Tolerated bed mobility and transfers with Min-Mod A. Tolerated gait training with use of RW and min guard assist for safety. Pt lives at [facility] with his wife. Not able to get the best picture of PLOF but pt reports he uses RW for ambulation and w/c for long distances. Reports some falls. If facility and wife able to care for pt at this level of care, recommend return home with HHPT and supervision for OOB mobility. If not, pt may need SNF services for a short time. Will follow acutely to maximize independence and mobility prior to return home.    Follow Up Recommendations Home health PT;Supervision for mobility/OOB    Equipment Recommendations  None recommended by PT    Recommendations for Other Services       Precautions / Restrictions Precautions Precautions: Fall Precaution Comments: hx of falls Restrictions Weight Bearing Restrictions: No      Mobility  Bed Mobility Overal bed mobility: Needs Assistance Bed Mobility: Supine to Sit     Supine to sit: Mod assist;HOB elevated     General bed mobility comments: Assist with LEs and trunk to get to EOB. Posterior lean initially, reports dizziness. Improved once feet on floor.  Transfers Overall transfer level: Needs assistance Equipment used: Rolling walker (2 wheeled) Transfers: Sit to/from Stand Sit to Stand: Min assist         General transfer comment: Assist to power to standing with cues  for hand placement with pt wanting to pull up on RW. Transferred to chair post ambulation.  Ambulation/Gait Ambulation/Gait assistance: Min guard Gait Distance (Feet): 60 Feet Assistive device: Rolling walker (2 wheeled) Gait Pattern/deviations: Step-to pattern;Step-through pattern;Decreased stride length;Decreased step length - left;Decreased step length - right;Wide base of support;Shuffle Gait velocity: decreased   General Gait Details: Short shuffling like gait with wide BoS; picking up RW for turns. Reports fatigue and weakness.  Stairs            Wheelchair Mobility    Modified Rankin (Stroke Patients Only)       Balance Overall balance assessment: Needs assistance;History of Falls Sitting-balance support: Feet supported;No upper extremity supported Sitting balance-Leahy Scale: Fair Sitting balance - Comments: Min guard for safety; initially Min A due to posterior lean and dizziness but improved to min guard. Postural control: Posterior lean Standing balance support: During functional activity Standing balance-Leahy Scale: Poor Standing balance comment: Requires UE support.                             Pertinent Vitals/Pain Pain Assessment: No/denies pain Faces Pain Scale: No hurt    Home Living Family/patient expects to be discharged to:: Assisted living([facility]) Living Arrangements: Spouse/significant other Available Help at Discharge: Family;Available 24 hours/day Type of Home: Apartment Home Access: Elevator     Home Layout: One level Home Equipment: Walker - 2 wheels;Walker - 4 wheels;Shower seat;Wheelchair - manual;Grab bars - toilet;Grab bars - tub/shower      Prior Function Level of  Independence: Needs assistance   Gait / Transfers Assistance Needed: uses RW for ambulation; reports falls.  ADL's / Homemaking Assistance Needed: Reports he does his ADLs although not sure of accuracy of reported PLOF. Per chart ~1 year ago, pt had  PCA coming to provide supervision in mornings a few days/week.        Hand Dominance        Extremity/Trunk Assessment   Upper Extremity Assessment Upper Extremity Assessment: Defer to OT evaluation;LUE deficits/detail LUE Deficits / Details: Limited shoulder elevation; reports difficulty with hands.    Lower Extremity Assessment Lower Extremity Assessment: Generalized weakness(but functional)       Communication   Communication: HOH  Cognition Arousal/Alertness: Awake/alert Behavior During Therapy: WFL for tasks assessed/performed Overall Cognitive Status: No family/caregiver present to determine baseline cognitive functioning Area of Impairment: Orientation;Following commands;Awareness;Problem solving                 Orientation Level: Disoriented to;Place;Time;Situation     Following Commands: Follows one step commands with increased time   Awareness: Intellectual Problem Solving: Decreased initiation;Difficulty sequencing;Requires verbal cues;Requires tactile cues General Comments: A&Ox1 to self. Difficulty with motor planning, problem solving requiring multi modal cues and repetition. Able to state he falls at home and has poor balance.      General Comments General comments (skin integrity, edema, etc.): Scabs/open wounds on head- per chart, pt with recent skin excisions due to skin ca.    Exercises     Assessment/Plan    PT Assessment Patient needs continued PT services  PT Problem List Decreased strength;Decreased mobility;Decreased safety awareness;Decreased activity tolerance;Decreased cognition;Decreased balance       PT Treatment Interventions Therapeutic activities;Gait training;Therapeutic exercise;Patient/family education;Balance training;Functional mobility training;Cognitive remediation    PT Goals (Current goals can be found in the Care Plan section)  Acute Rehab PT Goals Patient Stated Goal: to go home PT Goal Formulation: With  patient Time For Goal Achievement: 11/05/19 Potential to Achieve Goals: Good    Frequency Min 3X/week   Barriers to discharge        Co-evaluation               AM-PAC PT "6 Clicks" Mobility  Outcome Measure Help needed turning from your back to your side while in a flat bed without using bedrails?: A Little Help needed moving from lying on your back to sitting on the side of a flat bed without using bedrails?: A Lot Help needed moving to and from a bed to a chair (including a wheelchair)?: A Little Help needed standing up from a chair using your arms (e.g., wheelchair or bedside chair)?: A Little Help needed to walk in hospital room?: A Little Help needed climbing 3-5 steps with a railing? : A Lot 6 Click Score: 16    End of Session Equipment Utilized During Treatment: Gait belt Activity Tolerance: Patient tolerated treatment well Patient left: in chair;with call bell/phone within reach;with chair alarm set Nurse Communication: Mobility status PT Visit Diagnosis: Unsteadiness on feet (R26.81);Muscle weakness (generalized) (M62.81);Dizziness and giddiness (R42)    Time: [id number] PT Time Calculation (min) (ACUTE ONLY): 24 min   Charges:   PT Evaluation $PT Eval Moderate Complexity: 1 Mod PT Treatments $Gait Training: 8-22 mins        Marisa Severin, PT, DPT Acute Rehabilitation Services Pager [phone] Office [place] 10/22/2019, 9:05 AM

## 2019-10-22 NOTE — Progress Notes (Addendum)
PROGRESS NOTE    BERLE VOLLRATH  [id number] DOB: 1929-06-08 DOA: 10/21/2019 PCP: Mosie Lukes, MD   Brief Narrative:  Justin [name] is a 86 y.o. male with medical history significant of BPH with recurrent UTIs, CAD, severe aortic stenosis, Parkinson's disease, borderline systolic CHF with EF 40 to 45%, basal cell carcinomas, presented with new onset weakness and confusion.  Patient underwent Mohs procedure and nitrogen treatment of scalp basal cells last week, and has been feeling weak since then.  Denied any fever chills, no dysuria, but patient wife noticed patient has increased urinary frequency.  Patient denied any chest pain or short of breath.  He continues to complain about the pain on the scalp incision sites. In ED UA showed UTI, hospitalist called for admission.  Assessment & Plan:   Active Problems:   BPH with urinary obstruction   Acute lower UTI   Sepsis (HCC)   Generalized weakness   CHF (congestive heart failure) (HCC)   Chronic combined systolic and diastolic CHF (congestive heart failure) (HCC)   Impaired ambulation   Acute metabolic encephalopathy secondary to sepsis and gram-negative rod UTI, POA Continue antibiotics, mental status improving per wife at bedside but not yet back to baseline, still somewhat sluggish but answering questions more appropriately Continues to require IV fluids, IV antibiotics given mental status changes and poor p.o. intake Follow speech once patient more awake alert oriented  Uncontrolled hypertension Likely in the setting of above  Recent medication changes in the outpatient setting including discontinued amlodipine and initiation of Lasix (which he did not tolerate due to increased urinary output )  Chronic systolic CHF no acute exacerbation Appears euvolemic, follow I's and O's 1 dose of Bumex at admission  Parkinson disease Fairly controlled, continue current Sinemet dosage  Ambulatory dysfunction in the setting of  above with concurrent generalized weakness Likely from UTI, antibiotics and PT evaluation -evaluate for possible outpatient versus inpatient physical therapy at discharge  Aortic stenosis, chronic As per cardiology, conservative management, avoid overdiuresis.   DVT prophylaxis: Lovenox Code Status: DNR Family Communication: Wife at bedside Disposition Plan:  Continue as inpatient MedSurg, pending PT, mental status improvement and ongoing improvement in sepsis and infection likely disposition is back to previous facility with and without physical therapy pending PT evaluation. Consults called:  None Admission status:  Inpatient, continues to require IV fluids, IV antibiotics in the setting of sepsis, UTI with altered mental status poor p.o. intake and need for further evaluation and treatment with physical therapy due to ambulatory dysfunction  Subjective: Issues or events overnight, wife at bedside indicates patient is improving daily with mental status although not yet back to baseline.  Patient review systems quite limited due to Parkinson's and acute mental status changes but he currently declines any chest pain, shortness of breath, nausea, vomiting, diarrhea, constipation, headache, fevers, chills.  Objective: Vitals:   10/22/19 0150 10/22/19 0510 10/22/19 0624 10/22/19 1438  BP: 120/65 (!) 135/96  (!) 159/89  Pulse: 62 (!) 115 79 79  Resp: 16 19  18   Temp: (!) 97.3 F (36.3 C) 97.7 F (36.5 C)  97.6 F (36.4 C)  TempSrc: Oral Oral  Oral  SpO2: 95%       Intake/Output Summary (Last 24 hours) at 10/22/2019 1546 Last data filed at 10/21/2019 1812 Gross per 24 hour  Intake 319.71 ml  Output --  Net 319.71 ml   There were no vitals filed for this visit.  Examination:  General:  Pleasantly  resting in bed, No acute distress. HEENT:  Normocephalic atraumatic.  Sclerae nonicteric, noninjected.  Extraocular movements intact bilaterally. Neck:  Without mass or deformity.   Trachea is midline. Lungs:  Clear to auscultate bilaterally without rhonchi, wheeze, or rales. Heart:  Regular rate and rhythm.  Without murmurs, rubs, or gallops. Abdomen:  Soft, nontender, nondistended.  Without guarding or rebound. Extremities: Without cyanosis, clubbing, edema, or obvious deformity. Vascular:  Dorsalis pedis and posterior tibial pulses palpable bilaterally. Skin:  Warm and dry, no erythema, no ulcerations.   Data Reviewed: I have personally reviewed following labs and imaging studies  CBC: Recent Labs  Lab 10/21/19 1003 10/22/19 0527  WBC 11.5* 12.9*  HGB 13.8 14.0  HCT 42.5 42.6  MCV 89.1 87.5  PLT 361 0000000   Basic Metabolic Panel: Recent Labs  Lab 10/21/19 1003 10/22/19 0527  NA 135 137  K 4.2 4.0  CL 103 103  CO2 24 22  GLUCOSE 101* 93  BUN 17 13  CREATININE 1.23 1.15  CALCIUM 8.7* 8.8*   GFR: Estimated Creatinine Clearance: 37.9 mL/min (by C-G formula based on SCr of 1.15 mg/dL). Liver Function Tests: No results for input(s): AST, ALT, ALKPHOS, BILITOT, PROT, ALBUMIN in the last 168 hours. No results for input(s): LIPASE, AMYLASE in the last 168 hours. No results for input(s): AMMONIA in the last 168 hours. Coagulation Profile: No results for input(s): INR, PROTIME in the last 168 hours. Cardiac Enzymes: No results for input(s): CKTOTAL, CKMB, CKMBINDEX, TROPONINI in the last 168 hours. BNP (last 3 results) No results for input(s): PROBNP in the last 8760 hours. HbA1C: No results for input(s): HGBA1C in the last 72 hours. CBG: No results for input(s): GLUCAP in the last 168 hours. Lipid Profile: No results for input(s): CHOL, HDL, LDLCALC, TRIG, CHOLHDL, LDLDIRECT in the last 72 hours. Thyroid Function Tests: Recent Labs    10/21/19 1703  TSH 3.256   Anemia Panel: No results for input(s): VITAMINB12, FOLATE, FERRITIN, TIBC, IRON, RETICCTPCT in the last 72 hours. Sepsis Labs: No results for input(s): PROCALCITON, LATICACIDVEN in  the last 168 hours.  Recent Results (from the past 240 hour(s))  Urine culture     Status: Abnormal (Preliminary result)   Collection Time: 10/21/19 11:13 AM   Specimen: Urine, Clean Catch  Result Value Ref Range Status   Specimen Description URINE, CLEAN CATCH  Final   Special Requests Normal  Final   Culture (A)  Final    >=100,000 COLONIES/mL GRAM NEGATIVE RODS SUSCEPTIBILITIES TO FOLLOW Performed at [facility], [address], [place], [place] [address]    Report Status PENDING  Incomplete  SARS CORONAVIRUS 2 (TAT 6-24 HRS) Nasopharyngeal Nasopharyngeal Swab     Status: None   Collection Time: 10/21/19 12:35 PM   Specimen: Nasopharyngeal Swab  Result Value Ref Range Status   SARS Coronavirus 2 NEGATIVE NEGATIVE Final    Comment: (NOTE) SARS-CoV-2 target nucleic acids are NOT DETECTED. The SARS-CoV-2 RNA is generally detectable in upper and lower respiratory specimens during the acute phase of infection. Negative results do not preclude SARS-CoV-2 infection, do not rule out co-infections with other pathogens, and should not be used as the sole basis for treatment or other patient management decisions. Negative results must be combined with clinical observations, patient history, and epidemiological information. The expected result is Negative. Fact Sheet for Patients: SugarRoll.be Fact Sheet for Healthcare Providers: https://www.woods-mathews.com/ This test is not yet approved or cleared by the [place] FDA and  has been authorized for detection and/or diagnosis of SARS-CoV-2 by FDA under an Emergency Use Authorization (EUA). This EUA will remain  in effect (meaning this test can be used) for the duration of the COVID-19 declaration under Section 56 4(b)(1) of the Act, 21 U.S.C. section 360bbb-3(b)(1), unless the authorization is terminated or revoked sooner. Performed at [facility], [place] [address],  [place], [place] [address]   Culture, blood (routine x 2)     Status: None (Preliminary result)   Collection Time: 10/21/19 12:43 PM   Specimen: BLOOD  Result Value Ref Range Status   Specimen Description BLOOD BLOOD RIGHT FOREARM  Final   Special Requests   Final    BOTTLES DRAWN AEROBIC AND ANAEROBIC Blood Culture results may not be optimal due to an inadequate volume of blood received in culture bottles   Culture   Final    NO GROWTH < 24 HOURS Performed at [facility], [place] [address] [place], [place] [address]    Report Status PENDING  Incomplete  Culture, blood (routine x 2)     Status: None (Preliminary result)   Collection Time: 10/21/19 12:50 PM   Specimen: BLOOD  Result Value Ref Range Status   Specimen Description BLOOD BLOOD LEFT WRIST  Final   Special Requests   Final    BOTTLES DRAWN AEROBIC AND ANAEROBIC Blood Culture results may not be optimal due to an inadequate volume of blood received in culture bottles   Culture   Final    NO GROWTH < 24 HOURS Performed at [facility], [place] [address] [place], [place] [address]    Report Status PENDING  Incomplete    Radiology Studies: CT Head Wo Contrast  Result Date: 10/21/2019 CLINICAL DATA:  In cephalopathy, hallucinations EXAM: CT HEAD WITHOUT CONTRAST TECHNIQUE: Contiguous axial images were obtained from the base of the skull through the vertex without intravenous contrast. COMPARISON:  08/13/2018 FINDINGS: Brain: No evidence of acute infarction, hemorrhage, hydrocephalus, extra-axial collection or mass lesion/mass effect. Extensive periventricular and deep white matter hypodensity with global volume loss. Vascular: No hyperdense vessel or unexpected calcification. Skull: Normal. Negative for fracture or focal lesion. Sinuses/Orbits: No acute finding. Other: None. IMPRESSION: No acute intracranial pathology. Small-vessel white matter disease and global volume loss in keeping with advanced patient age. Electronically  Signed   By: Eddie Candle M.D.   On: 10/21/2019 14:18   DG Chest Port 1 View  Result Date: 10/21/2019 CLINICAL DATA:  Weakness, hallucinations. EXAM: PORTABLE CHEST 1 VIEW COMPARISON:  10/02/2019 FINDINGS: Cardiomediastinal contours and hilar structures are unchanged. The cardiac enlargement following median sternotomy for CABG and calcified lymph nodes about the mediastinum similar to the prior study. Lungs are clear. No consolidation. No pleural effusion. Marked glenohumeral degenerative changes worse on the LEFT. IMPRESSION: No acute cardiopulmonary disease. Signs of granulomatous disease with calcified lymph nodes in the mediastinum and hila. Electronically Signed   By: Zetta Bills M.D.   On: 10/21/2019 13:02    Scheduled Meds:  acidophilus  1 capsule Oral QPC breakfast   amiodarone  200 mg Oral Daily   aspirin EC  81 mg Oral Daily   carbidopa-levodopa  1.5 tablet Oral TID   docusate sodium  100 mg Oral QPC breakfast   enoxaparin (LOVENOX) injection  40 mg Subcutaneous Q24H   ferrous sulfate  325 mg Oral QPC supper   finasteride  5 mg Oral QPC breakfast   hydrALAZINE  10 mg Oral Q8H  levothyroxine  75 mcg Oral Q0600   multivitamin with minerals  1 tablet Oral QPC breakfast   multivitamin with minerals  1 tablet Oral QPC breakfast   pravastatin  20 mg Oral q1800   sodium chloride flush  3 mL Intravenous Once   Continuous Infusions:  cefTRIAXone (ROCEPHIN)  IV 1 g (10/22/19 1032)     LOS: 1 day   Time spent: 35 minutes  Little Ishikawa, DO [facility]  If 7PM-7AM, please contact night-coverage [email or web address]  10/22/2019, 3:46 PM

## 2019-10-22 NOTE — NC FL2 (Addendum)
[place] MEDICAID FL2 LEVEL OF CARE SCREENING TOOL     IDENTIFICATION  Patient Name: Justin [name] Birthdate: October 19, 1928 Sex: male Admission Date (Current Location): 10/21/2019  [facility] and [place] Number:  Herbalist and Address:  [facility], [place] [address], [place], [place] [address]      Provider Number: [id number]  Attending Physician Name and Address:  Little Ishikawa, MD  Relative Name and Phone Number:       Current Level of Care: Hospital Recommended Level of Care: [place] Prior Approval Number:    Date Approved/Denied:   PASRR Number: [id number]  Discharge Plan: SNF    Current Diagnoses: Patient Active Problem List   Diagnosis Date Noted  . Impaired ambulation 10/21/2019  . Parkinson disease ([place]) 09/05/2018  . Hypothyroidism 08/25/2018  . Chronic combined systolic and diastolic CHF (congestive heart failure) ([place]) 08/25/2018  . Sundowning 06/24/2017  . Ischemic cardiomyopathy 06/24/2017  . Anticoagulated 06/24/2017  . Syncope   . Sigmoid diverticulitis   . Internal hemorrhoids   . HTN (hypertension)   . Complication of anesthesia   . CHF (congestive heart failure) ([place])   . Basal cell carcinoma   . NSTEMI (non-ST elevated myocardial infarction) ([place])   . Chest pain 06/10/2017  . Unstable angina ([place]) 06/10/2017  . Acute lower UTI 11/24/2016  . Sepsis ([place]) 11/24/2016  . Generalized weakness 11/24/2016  . Hyponatremia 11/24/2016  . BPH with urinary obstruction 11/04/2016  . H/O prostatectomy 11/04/2016  . Fall   . Closed left hip fracture ([place]) 10/13/2015  . Medicare annual wellness visit, subsequent 03/16/2015  . Paroxysmal atrial fibrillation ([place]) 09/26/2014  . Vertigo 09/15/2014  . Head trauma 09/14/2014  . Alteration in anticoagulation 09/14/2014  . Tinnitus 03/21/2014  . Epigastric pain 02/01/2013  . Gait instability 01/01/2013  . Disequilibrium 04/03/2012  . Hyperglycemia  09/13/2011  . Aortic stenosis 03/10/2011  . CAD S/P percutaneous coronary angioplasty 02/23/2010  . Hyperlipidemia 07/04/2008  . Essential hypertension 07/04/2008  . RHINITIS 07/04/2008  . BPH (benign prostatic hyperplasia) 07/04/2008  . Hx of CABG 06/28/1989    Orientation RESPIRATION BLADDER Height & Weight     Self  Normal Incontinent Weight:   Height:     BEHAVIORAL SYMPTOMS/MOOD NEUROLOGICAL BOWEL NUTRITION STATUS      Continent Diet  AMBULATORY STATUS COMMUNICATION OF NEEDS Skin   Limited Assist Verbally Normal, Other (Comment)(generalized ecchymosis)                       Personal Care Assistance Level of Assistance  Bathing, Feeding, Dressing Bathing Assistance: Limited assistance Feeding assistance: Independent Dressing Assistance: Limited assistance     Functional Limitations Info  Sight, Hearing, Speech Sight Info: Impaired Hearing Info: Adequate Speech Info: Adequate    SPECIAL CARE FACTORS FREQUENCY  PT (By licensed PT), OT (By licensed OT)     PT Frequency: 3x week OT Frequency: 2x week            Contractures Contractures Info: Not present    Additional Factors Info  Code Status, Allergies Code Status Info: DNR Allergies Info: Morphine, Morphine And Related, Diltiazem Hcl, Metoprolol, Toradol (Ketorolac Tromethamine), Hydrocodone, Sulfa Antibiotics, Dutasteride           Current Medications (10/22/2019):  This is the current hospital active medication list Current Facility-Administered Medications  Medication Dose Route Frequency Provider Last Rate Last Admin  . acetaminophen (TYLENOL) tablet 500 mg  500  mg Oral Daily PRN Wynetta Fines T, MD      . acidophilus (RISAQUAD) capsule 1 capsule  1 capsule Oral QPC breakfast Wynetta Fines T, MD   1 capsule at 10/22/19 1033  . amiodarone (PACERONE) tablet 200 mg  200 mg Oral Daily Wynetta Fines T, MD   200 mg at 10/21/19 1705  . aspirin EC tablet 81 mg  81 mg Oral Daily Wynetta Fines T, MD   81 mg at  10/22/19 1034  . carbidopa-levodopa (SINEMET IR) 25-100 MG per tablet immediate release 1.5 tablet  1.5 tablet Oral TID Wynetta Fines T, MD   1.5 tablet at 10/22/19 1033  . cefTRIAXone (ROCEPHIN) 1 g in sodium chloride 0.9 % 100 mL IVPB  1 g Intravenous Q24H Wynetta Fines T, MD 200 mL/hr at 10/22/19 1032 1 g at 10/22/19 1032  . docusate sodium (COLACE) capsule 100 mg  100 mg Oral QPC breakfast Wynetta Fines T, MD   100 mg at 10/22/19 1033  . enoxaparin (LOVENOX) injection 40 mg  40 mg Subcutaneous Q24H Wynetta Fines T, MD   40 mg at 10/21/19 1705  . ferrous sulfate tablet 325 mg  325 mg Oral QPC supper Wynetta Fines T, MD   325 mg at 10/21/19 1706  . finasteride (PROSCAR) tablet 5 mg  5 mg Oral QPC breakfast Wynetta Fines T, MD   5 mg at 10/22/19 1034  . hydrALAZINE (APRESOLINE) tablet 10 mg  10 mg Oral Q8H Wynetta Fines T, MD   10 mg at 10/22/19 0606  . levothyroxine (SYNTHROID) tablet 75 mcg  75 mcg Oral Q0600 Lequita Halt, MD   75 mcg at 10/22/19 0606  . lidocaine (XYLOCAINE) 5 % ointment   Topical TID PRN Wynetta Fines T, MD      . meclizine (ANTIVERT) tablet 25 mg  25 mg Oral TID PRN Wynetta Fines T, MD      . multivitamin with minerals tablet 1 tablet  1 tablet Oral QPC breakfast Wynetta Fines T, MD      . multivitamin with minerals tablet 1 tablet  1 tablet Oral QPC breakfast Lequita Halt, MD   1 tablet at 10/22/19 1033  . polyvinyl alcohol (LIQUIFILM TEARS) 1.4 % ophthalmic solution   Both Eyes Daily PRN Wynetta Fines T, MD      . pravastatin (PRAVACHOL) tablet 20 mg  20 mg Oral q1800 Wynetta Fines T, MD   20 mg at 10/21/19 1705  . sodium chloride flush (NS) 0.9 % injection 3 mL  3 mL Intravenous Once Hayden Rasmussen, MD         Discharge Medications: Please see discharge summary for a list of discharge medications.  Relevant Imaging Results:  Relevant Lab Results:   Additional Information SSN: [id number]  Alexander Mt, LCSW

## 2019-10-23 LAB — URINE CULTURE
Culture: >=100000 — AB
Special Requests: NORMAL

## 2019-10-23 LAB — BASIC METABOLIC PANEL WITH GFR
Anion gap: 7 (ref 5–15)
BUN: 19 mg/dL (ref 8–23)
CO2: 24 mmol/L (ref 22–32)
Calcium: 8.3 mg/dL — ABNORMAL LOW (ref 8.9–10.3)
Chloride: 102 mmol/L (ref 98–111)
Creatinine, Ser: 1.13 mg/dL (ref 0.61–1.24)
GFR calc Af Amer: >60 mL/min
GFR calc non Af Amer: 57 mL/min — ABNORMAL LOW
Glucose, Bld: 106 mg/dL — ABNORMAL HIGH (ref 70–99)
Potassium: 3.6 mmol/L (ref 3.5–5.1)
Sodium: 133 mmol/L — ABNORMAL LOW (ref 135–145)

## 2019-10-23 LAB — CBC
HCT: 35.3 % — ABNORMAL LOW (ref 39.0–52.0)
Hemoglobin: 11.8 g/dL — ABNORMAL LOW (ref 13.0–17.0)
MCH: 28.9 pg (ref 26.0–34.0)
MCHC: 33.4 g/dL (ref 30.0–36.0)
MCV: 86.3 fL (ref 80.0–100.0)
Platelets: 304 K/uL (ref 150–400)
RBC: 4.09 MIL/uL — ABNORMAL LOW (ref 4.22–5.81)
RDW: 14.9 % (ref 11.5–15.5)
WBC: 9.2 K/uL (ref 4.0–10.5)
nRBC: 0 % (ref 0.0–0.2)

## 2019-10-23 NOTE — Progress Notes (Signed)
PROGRESS NOTE    DRAYLEN RUTKOWSKI  [id number] DOB: December 11, 1928 DOA: 10/21/2019 PCP: Mosie Lukes, MD   Brief Narrative:  Justin MARCOUX is a 86 y.o. male with medical history significant of BPH with recurrent UTIs, CAD, severe aortic stenosis, Parkinson's disease, borderline systolic CHF with EF 40 to 45%, basal cell carcinomas, presented with new onset weakness and confusion.  Patient underwent Mohs procedure and nitrogen treatment of scalp basal cells last week, and has been feeling weak since then.  Denied any fever chills, no dysuria, but patient wife noticed patient has increased urinary frequency.  Patient denied any chest pain or short of breath.  He continues to complain about the pain on the scalp incision sites. In ED UA showed UTI, hospitalist called for admission.  Assessment & Plan:   Active Problems:   BPH with urinary obstruction   Acute lower UTI   Sepsis (HCC)   Generalized weakness   CHF (congestive heart failure) (HCC)   Chronic combined systolic and diastolic CHF (congestive heart failure) (HCC)   Impaired ambulation  Acute metabolic encephalopathy secondary to sepsis and gram-negative rod UTI, POA Sundowning/hospital delirium overnight Continue antibiotics, mental status improving this morning per wife at bedside but not yet back to baseline, still somewhat sluggish but answering questions more appropriately To have issues overnight with sundowning, he has had this issue in the past Continues to require IV antibiotics given mental status changes Follow cultures, patient remains afebrile without leukocytosis  Transient episode of paroxysmal afib on telemetry resolved Likely provoked in the setting of above Resolved early morning Continue home amiodarone Not a candidate for anticoagulation given markedly high risk of falls Chadsvasc 3  Uncontrolled hypertension Likely in the setting of above  Recent medication changes in the outpatient setting including  discontinued amlodipine and initiation of Lasix (which he did not tolerate due to increased urinary output )  Chronic systolic CHF no acute exacerbation Appears euvolemic, follow I's and O's 1 dose of Bumex at admission  Parkinson disease Fairly controlled, continue current Sinemet dosage  Ambulatory dysfunction in the setting of above with concurrent generalized weakness Likely from UTI, antibiotics and PT evaluation -evaluate for possible outpatient versus inpatient physical therapy at discharge  Aortic stenosis, chronic As per cardiology, conservative management, avoid overdiuresis.   DVT prophylaxis: Lovenox Code Status: DNR Family Communication: Wife at bedside Disposition Plan:  Continue as inpatient requiring IV antibiotics, close monitoring in the setting of mental status changes. PT evaluating, family requesting SNF placement which seems reasonable given patient's ambulatory dysfunction with acute metabolic encephalopathy. Consults called:  None Admission status:  Inpatient, continues to require IV antibiotics in the setting of sepsis, UTI with altered mental status poor p.o. intake and need for further evaluation and treatment with physical therapy due to ambulatory dysfunction  Subjective: Overnight patient had episodes of sundowning/delirium, wife at bedside, reports patient did not calm down until 2 AM with very little sleep. Patient's review of systems is markedly limited given his mental status although he denies any chest pain shortness of breath nausea vomiting diarrhea constipation  Objective: Vitals:   10/22/19 0624 10/22/19 1438 10/22/19 2109 10/23/19 0535  BP:  (!) 159/89 (!) 147/82 120/67  Pulse: 79 79 75 (!) 59  Resp:  18 17 16   Temp:  97.6 F (36.4 C) (!) 97.5 F (36.4 C) (!) 97.5 F (36.4 C)  TempSrc:  Oral Oral Oral  SpO2:   95% 96%    Intake/Output Summary (Last 24 hours)  at 10/23/2019 0730 Last data filed at 10/23/2019 0500 Gross per 24 hour   Intake 100.33 ml  Output 300 ml  Net -199.67 ml   There were no vitals filed for this visit.  Examination:  General:  Pleasantly resting in bed, No acute distress. Alert to person only HEENT:  Normocephalic atraumatic.  Sclerae nonicteric, noninjected.  Extraocular movements intact bilaterally. Neck:  Without mass or deformity.  Trachea is midline. Lungs:  Clear to auscultate bilaterally without rhonchi, wheeze, or rales. Heart:  Regular rate and rhythm. Left sternal border 4 out of 6 murmur with notable click Abdomen:  Soft, nontender, nondistended.  Without guarding or rebound. Extremities: Without cyanosis, clubbing, edema, or obvious deformity. Vascular:  Dorsalis pedis and posterior tibial pulses palpable bilaterally. Skin:  Warm and dry, multiple excoriations diffusely, patient does have noted superior temporal sutures from previous biopsy, appear clean dry intact without purulence or erythema   Data Reviewed: I have personally reviewed following labs and imaging studies  CBC: Recent Labs  Lab 10/21/19 1003 10/22/19 0527 10/23/19 0232  WBC 11.5* 12.9* 9.2  HGB 13.8 14.0 11.8*  HCT 42.5 42.6 35.3*  MCV 89.1 87.5 86.3  PLT 361 370 [id number]   Basic Metabolic Panel: Recent Labs  Lab 10/21/19 1003 10/22/19 0527 10/23/19 0232  NA 135 137 133*  K 4.2 4.0 3.6  CL 103 103 102  CO2 24 22 24   GLUCOSE 101* 93 106*  BUN 17 13 19   CREATININE 1.23 1.15 1.13  CALCIUM 8.7* 8.8* 8.3*   GFR: Estimated Creatinine Clearance: 38.6 mL/min (by C-G formula based on SCr of 1.13 mg/dL). Liver Function Tests: No results for input(s): AST, ALT, ALKPHOS, BILITOT, PROT, ALBUMIN in the last 168 hours. No results for input(s): LIPASE, AMYLASE in the last 168 hours. No results for input(s): AMMONIA in the last 168 hours. Coagulation Profile: No results for input(s): INR, PROTIME in the last 168 hours. Cardiac Enzymes: No results for input(s): CKTOTAL, CKMB, CKMBINDEX, TROPONINI in the last  168 hours. BNP (last 3 results) No results for input(s): PROBNP in the last 8760 hours. HbA1C: No results for input(s): HGBA1C in the last 72 hours. CBG: No results for input(s): GLUCAP in the last 168 hours. Lipid Profile: No results for input(s): CHOL, HDL, LDLCALC, TRIG, CHOLHDL, LDLDIRECT in the last 72 hours. Thyroid Function Tests: Recent Labs    10/21/19 1703  TSH 3.256   Anemia Panel: No results for input(s): VITAMINB12, FOLATE, FERRITIN, TIBC, IRON, RETICCTPCT in the last 72 hours. Sepsis Labs: No results for input(s): PROCALCITON, LATICACIDVEN in the last 168 hours.  Recent Results (from the past 240 hour(s))  Urine culture     Status: Abnormal (Preliminary result)   Collection Time: 10/21/19 11:13 AM   Specimen: Urine, Clean Catch  Result Value Ref Range Status   Specimen Description URINE, CLEAN CATCH  Final   Special Requests Normal  Final   Culture (A)  Final    >=100,000 COLONIES/mL GRAM NEGATIVE RODS SUSCEPTIBILITIES TO FOLLOW Performed at [facility], [address], [place], [place] [address]    Report Status PENDING  Incomplete  SARS CORONAVIRUS 2 (TAT 6-24 HRS) Nasopharyngeal Nasopharyngeal Swab     Status: None   Collection Time: 10/21/19 12:35 PM   Specimen: Nasopharyngeal Swab  Result Value Ref Range Status   SARS Coronavirus 2 NEGATIVE NEGATIVE Final    Comment: (NOTE) SARS-CoV-2 target nucleic acids are NOT DETECTED. The SARS-CoV-2 RNA is generally detectable in upper and  lower respiratory specimens during the acute phase of infection. Negative results do not preclude SARS-CoV-2 infection, do not rule out co-infections with other pathogens, and should not be used as the sole basis for treatment or other patient management decisions. Negative results must be combined with clinical observations, patient history, and epidemiological information. The expected result is Negative. Fact Sheet for Patients:  SugarRoll.be Fact Sheet for Healthcare Providers: https://www.woods-mathews.com/ This test is not yet approved or cleared by the [place] FDA and  has been authorized for detection and/or diagnosis of SARS-CoV-2 by FDA under an Emergency Use Authorization (EUA). This EUA will remain  in effect (meaning this test can be used) for the duration of the COVID-19 declaration under Section 56 4(b)(1) of the Act, 21 U.S.C. section 360bbb-3(b)(1), unless the authorization is terminated or revoked sooner. Performed at [facility], [place] [address] [place], [place] [address]   Culture, blood (routine x 2)     Status: None (Preliminary result)   Collection Time: 10/21/19 12:43 PM   Specimen: BLOOD  Result Value Ref Range Status   Specimen Description BLOOD BLOOD RIGHT FOREARM  Final   Special Requests   Final    BOTTLES DRAWN AEROBIC AND ANAEROBIC Blood Culture results may not be optimal due to an inadequate volume of blood received in culture bottles   Culture   Final    NO GROWTH 2 DAYS Performed at [facility], [place] [address], [place], [place] [address]    Report Status PENDING  Incomplete  Culture, blood (routine x 2)     Status: None (Preliminary result)   Collection Time: 10/21/19 12:50 PM   Specimen: BLOOD  Result Value Ref Range Status   Specimen Description BLOOD BLOOD LEFT WRIST  Final   Special Requests   Final    BOTTLES DRAWN AEROBIC AND ANAEROBIC Blood Culture results may not be optimal due to an inadequate volume of blood received in culture bottles   Culture   Final    NO GROWTH 2 DAYS Performed at [facility], [place] [address], [place], [place] [address]    Report Status PENDING  Incomplete    Radiology Studies: CT Head Wo Contrast  Result Date: 10/21/2019 CLINICAL DATA:  In cephalopathy, hallucinations EXAM: CT HEAD WITHOUT CONTRAST TECHNIQUE: Contiguous axial images were obtained from the base of the skull  through the vertex without intravenous contrast. COMPARISON:  08/13/2018 FINDINGS: Brain: No evidence of acute infarction, hemorrhage, hydrocephalus, extra-axial collection or mass lesion/mass effect. Extensive periventricular and deep white matter hypodensity with global volume loss. Vascular: No hyperdense vessel or unexpected calcification. Skull: Normal. Negative for fracture or focal lesion. Sinuses/Orbits: No acute finding. Other: None. IMPRESSION: No acute intracranial pathology. Small-vessel white matter disease and global volume loss in keeping with advanced patient age. Electronically Signed   By: Eddie Candle M.D.   On: 10/21/2019 14:18   DG Chest Port 1 View  Result Date: 10/21/2019 CLINICAL DATA:  Weakness, hallucinations. EXAM: PORTABLE CHEST 1 VIEW COMPARISON:  10/02/2019 FINDINGS: Cardiomediastinal contours and hilar structures are unchanged. The cardiac enlargement following median sternotomy for CABG and calcified lymph nodes about the mediastinum similar to the prior study. Lungs are clear. No consolidation. No pleural effusion. Marked glenohumeral degenerative changes worse on the LEFT. IMPRESSION: No acute cardiopulmonary disease. Signs of granulomatous disease with calcified lymph nodes in the mediastinum and hila. Electronically Signed   By: Zetta Bills M.D.   On: 10/21/2019 13:02  Scheduled Meds: . acidophilus  1 capsule Oral QPC breakfast  . amiodarone  200 mg Oral Daily  . aspirin EC  81 mg Oral Daily  . carbidopa-levodopa  1.5 tablet Oral TID  . docusate sodium  100 mg Oral QPC breakfast  . enoxaparin (LOVENOX) injection  40 mg Subcutaneous Q24H  . ferrous sulfate  325 mg Oral QPC supper  . finasteride  5 mg Oral QPC breakfast  . hydrALAZINE  10 mg Oral Q8H  . levothyroxine  75 mcg Oral Q0600  . multivitamin with minerals  1 tablet Oral QPC breakfast  . multivitamin with minerals  1 tablet Oral QPC breakfast  . pravastatin  20 mg Oral q1800  . sodium chloride  flush  3 mL Intravenous Once   Continuous Infusions: . cefTRIAXone (ROCEPHIN)  IV 1 g (10/22/19 1032)     LOS: 2 days   Time spent: 35 minutes  Little Ishikawa, DO [facility]  If 7PM-7AM, please contact night-coverage [email or web address]  10/23/2019, 7:30 AM

## 2019-10-23 NOTE — TOC Initial Note (Signed)
Transition of Care [facility]) - Initial/Assessment Note    Patient Details  Name: Justin [name] MRN: [id number] Date of Birth: 10-31-1928  Transition of Care [facility]) CM/SW Contact:    Alexander Mt, LCSW Phone Number:  10/23/2019, 1:00 PM  Clinical Narrative:                 CSW spoke with pt wife [place] via telephone. Introduced self, role, reason for call. Pt lives in [place]. He usually ambulates with a walker. His wife provides supervision. She confirms home address and PCP. We discussed recommendations and preference. Pt wife prefers a short stay at [facility] the SNF before returning home. CSW started authorization w/ [employer]. Confirmed with Luellen Pucker at [facility] no new COVID needed. Pt has had both vaccinations and negative COVID on admission.   Pt PTAR transport was approved by [facility]. Will await authorization determination for SNF. Pt will need to be telesitter/sitter free for 24 hrs prior to d/c.   Expected Discharge Plan: Skilled Nursing Facility Barriers to Discharge: Continued Medical Work up, Ship broker   Patient Goals and CMS Choice Patient states their goals for this hospitalization and ongoing recovery are:: prefer for rehab at [facility] CMS Medicare.gov Compare Post Acute Care list provided to:: Patient Represenative (must comment)(pt wife [place]/they are residents at [facility]) Choice offered to / list presented to : Spouse  Expected Discharge Plan and Services Expected Discharge Plan: [place] In-house Referral: Clinical Social Work Discharge Planning Services: CM Consult Post Acute Care Choice: [place], [place] arrangements for the past 2 months: [place]    Prior Living Arrangements/Services Living arrangements for the past 2 months: [place] Lives with:: Spouse Patient language and need for interpreter reviewed:: Yes(no needs) Do you feel  safe going back to the place where you live?: Yes      Need for Family Participation in Patient Care: Yes (Comment)(assistance w/ daily cares) Care giver support system in place?: Yes (comment)(pt spouse; facility staff) Current home services: DME Criminal Activity/Legal Involvement Pertinent to Current Situation/Hospitalization: No - Comment as needed   Permission Sought/Granted Permission sought to share information with : Family Supports, Chartered certified accountant granted to share information with : Yes, Verbal Permission Granted  Share Information with NAME: [place] Gerhold  Permission granted to share info w AGENCY: [place] granted to share info w Relationship: pt wife  Permission granted to share info w Contact Information: [phone]  Emotional Assessment Appearance:: Appears stated age Attitude/Demeanor/Rapport: Unable to Assess(pt disoriented) Affect (typically observed): Unable to Assess(pt disoriented) Orientation: : Oriented to Self, Fluctuating Orientation (Suspected and/or reported Sundowners) Alcohol / Substance Use: Not Applicable Psych Involvement: (n/a)  Admission diagnosis:  Lower urinary tract infectious disease [N39.0] Encephalopathy acute [G93.40] Impaired ambulation [R26.2] Patient Active Problem List   Diagnosis Date Noted   Impaired ambulation 10/21/2019   Parkinson disease ([place]) 09/05/2018   Hypothyroidism 08/25/2018   Chronic combined systolic and diastolic CHF (congestive heart failure) ([place]) 08/25/2018   Sundowning 06/24/2017   Ischemic cardiomyopathy 06/24/2017   Anticoagulated 06/24/2017   Syncope    Sigmoid diverticulitis    Internal hemorrhoids    HTN (hypertension)    Complication of anesthesia    CHF (congestive heart failure) (HCC)    Basal cell carcinoma    NSTEMI (non-ST elevated myocardial infarction) ([place])    Chest pain 06/10/2017   Unstable angina ([place]) 06/10/2017   Acute lower UTI 11/24/2016  Sepsis ([place]) 11/24/2016   Generalized weakness 11/24/2016   Hyponatremia 11/24/2016   BPH with urinary obstruction 11/04/2016   H/O prostatectomy 11/04/2016   Fall    Closed left hip fracture ([place]) 10/13/2015   Medicare annual wellness visit, subsequent 03/16/2015   Paroxysmal atrial fibrillation ([place]) 09/26/2014   Vertigo 09/15/2014   Head trauma 09/14/2014   Alteration in anticoagulation 09/14/2014   Tinnitus 03/21/2014   Epigastric pain 02/01/2013   Gait instability 01/01/2013   Disequilibrium 04/03/2012   Hyperglycemia 09/13/2011   Aortic stenosis 03/10/2011   CAD S/P percutaneous coronary angioplasty 02/23/2010   Hyperlipidemia 07/04/2008   Essential hypertension 07/04/2008   RHINITIS 07/04/2008   BPH (benign prostatic hyperplasia) 07/04/2008   Hx of CABG 06/28/1989   PCP:  Mosie Lukes, MD Pharmacy:   [facility], [place] - [address] [place] [address] Phone: [phone] Fax: [phone]   Readmission Risk Interventions Readmission Risk Prevention Plan 10/23/2019  Transportation Screening Complete  PCP or Specialist Appt within 5-7 Days Not Complete  Not Complete comments plan for SNF  Home Care Screening Complete  Medication Review (RN CM) Referral to Pharmacy  Some recent data might be hidden

## 2019-10-23 NOTE — Plan of Care (Signed)
  Problem: Health Behavior/Discharge Planning: Goal: Ability to manage health-related needs will improve Outcome: Progressing   

## 2019-10-24 DIAGNOSIS — I5040 Unspecified combined systolic (congestive) and diastolic (congestive) heart failure: Secondary | ICD-10-CM

## 2019-10-24 DIAGNOSIS — R531 Weakness: Secondary | ICD-10-CM

## 2019-10-24 DIAGNOSIS — N138 Other obstructive and reflux uropathy: Secondary | ICD-10-CM

## 2019-10-24 DIAGNOSIS — I5042 Chronic combined systolic (congestive) and diastolic (congestive) heart failure: Secondary | ICD-10-CM

## 2019-10-24 DIAGNOSIS — N401 Enlarged prostate with lower urinary tract symptoms: Secondary | ICD-10-CM

## 2019-10-24 LAB — CBC
HCT: 35.6 % — ABNORMAL LOW (ref 39.0–52.0)
Hemoglobin: 11.9 g/dL — ABNORMAL LOW (ref 13.0–17.0)
MCH: 28.8 pg (ref 26.0–34.0)
MCHC: 33.4 g/dL (ref 30.0–36.0)
MCV: 86.2 fL (ref 80.0–100.0)
Platelets: 352 10*3/uL (ref 150–400)
RBC: 4.13 MIL/uL — ABNORMAL LOW (ref 4.22–5.81)
RDW: 14.9 % (ref 11.5–15.5)
WBC: 9 10*3/uL (ref 4.0–10.5)
nRBC: 0 % (ref 0.0–0.2)

## 2019-10-24 LAB — BASIC METABOLIC PANEL
Anion gap: 10 (ref 5–15)
BUN: 21 mg/dL (ref 8–23)
CO2: 25 mmol/L (ref 22–32)
Calcium: 8.4 mg/dL — ABNORMAL LOW (ref 8.9–10.3)
Chloride: 100 mmol/L (ref 98–111)
Creatinine, Ser: 1.23 mg/dL (ref 0.61–1.24)
GFR calc Af Amer: 60 mL/min — ABNORMAL LOW (ref >60)
GFR calc non Af Amer: 52 mL/min — ABNORMAL LOW (ref >60)
Glucose, Bld: 101 mg/dL — ABNORMAL HIGH (ref 70–99)
Potassium: 4 mmol/L (ref 3.5–5.1)
Sodium: 135 mmol/L (ref 135–145)

## 2019-10-24 NOTE — Progress Notes (Signed)
Sutures removed from R head wound per order. No complications, patient tolerated procedure well.

## 2019-10-24 NOTE — TOC Progression Note (Addendum)
Transition of Care [facility]) - Progression Note    Patient Details  Name: Justin [name] MRN: [id number] Date of Birth: 01-Feb-1929  Transition of Care [facility]) CM/SW [place], [place] Phone Number: 10/24/2019, 11:02 AM  Clinical Narrative:    11:45am- [place] has confirmed by the [place] [facility] [place] that they have to maintain 24 hrs without telesitter unless his family is going to private pay for sitter. CSW spoke with pt wife [place] and she is aware. She will transport pt when he is cleared tomorrow morning. Luellen Pucker at [facility] and Dr. Avon Gully updated.   11:02am- Auth received for both PTAR and SNF placement (SNF- [id number]; PTARXO:[id number])  SNF protocol usually requires 24 hrs sitter and restraint free (no mittens, sitter, telesitters) prior to return to SNF. CSW has been in contact w/ [facility] in order to assess if they can take back without 24 hrs telesitter free. Luellen Pucker is checking with her Director of Nursing. Pt wife ready for pt to d/c and pt medically stable.  Will f/u with pt wife and RN Clarise Cruz when update given from [place] with SNF.   Expected Discharge Plan: [place] Barriers to Discharge: Requiring sitter/restraints(has been d/c await confirmation as to if SNF needs 24 hrs telesitter free)  Expected Discharge Plan and Services Expected Discharge Plan: [place] In-house Referral: Clinical Social Work Discharge Planning Services: CM Consult Post Acute Care Choice: [place], Durable Medical Equipment Living arrangements for the past 2 months: [place]  Readmission Risk Interventions Readmission Risk Prevention Plan 10/23/2019  Transportation Screening Complete  PCP or Specialist Appt within 5-7 Days Not Complete  Not Complete comments plan for SNF  Home Care Screening Complete  Medication Review (RN CM) Referral to Pharmacy  Some recent data might be hidden

## 2019-10-24 NOTE — Progress Notes (Signed)
Physical Therapy Treatment Patient Details Name: Justin [name] MRN: [id number] DOB: [birth year] Today's Date: 10/24/2019    History of Present Illness Patient is a 86 y/o male who presents with confusion, urinary frequency and weakness. Admitted with UTI. PMH includes CAd s/p CABG 2000, CHF, BPH with recurrent UTIs, basal cell carcinoma, HTN, paroxysmal A-fib.    PT Comments    Pt performed gt training with min assistance.  He required increased time to mobilize and very fatigued after ambulation.  Limited session as his lunch was arriving.  Will update recommendations to snf per discussion with wife and CSW.   SNF placement will allow patient to make functional gains before returning home with wife to [place].     Follow Up Recommendations  SNF;Supervision/Assistance - 24 hour     Equipment Recommendations  None recommended by PT    Recommendations for Other Services       Precautions / Restrictions Precautions Precautions: Fall Precaution Comments: hx of falls Restrictions Weight Bearing Restrictions: No    Mobility  Bed Mobility Overal bed mobility: Needs Assistance Bed Mobility: Supine to Sit     Supine to sit: Min guard     General bed mobility comments: Increased time and effort to move to edge of bed with heavy use of bed rails.  Transfers Overall transfer level: Needs assistance Equipment used: Rolling walker (2 wheeled) Transfers: Sit to/from Stand Sit to Stand: Min guard         General transfer comment: Cues for hand placement and forward weight shifting to rise into standing.  Ambulation/Gait Ambulation/Gait assistance: Min guard Gait Distance (Feet): 60 Feet Assistive device: Rolling walker (2 wheeled) Gait Pattern/deviations: Step-to pattern;Step-through pattern;Decreased stride length;Decreased step length - left;Decreased step length - right;Wide base of support;Shuffle;Trunk flexed Gait velocity: decreased   General Gait Details: Short shuffling  like gait with wide BoS; picking up RW for turns. Reports fatigue and weakness.  Cues for scapular retraction and upper trunk control.   Stairs             Wheelchair Mobility    Modified Rankin (Stroke Patients Only)       Balance Overall balance assessment: Needs assistance;History of Falls Sitting-balance support: Feet supported;No upper extremity supported Sitting balance-Leahy Scale: Fair       Standing balance-Leahy Scale: Poor                              Cognition Arousal/Alertness: Awake/alert Behavior During Therapy: WFL for tasks assessed/performed Overall Cognitive Status: No family/caregiver present to determine baseline cognitive functioning Area of Impairment: Following commands;Problem solving                       Following Commands: Follows one step commands with increased time     Problem Solving: Decreased initiation;Difficulty sequencing;Requires verbal cues;Requires tactile cues        Exercises      General Comments        Pertinent Vitals/Pain Pain Assessment: No/denies pain Faces Pain Scale: No hurt    Home Living                      Prior Function            PT Goals (current goals can now be found in the care plan section) Acute Rehab PT Goals Patient Stated Goal: to go home PT Goal Formulation: With patient Potential  to Achieve Goals: Good Progress towards PT goals: Progressing toward goals    Frequency    Min 3X/week      PT Plan Discharge plan needs to be updated    Co-evaluation              AM-PAC PT "6 Clicks" Mobility   Outcome Measure  Help needed turning from your back to your side while in a flat bed without using bedrails?: A Little Help needed moving from lying on your back to sitting on the side of a flat bed without using bedrails?: A Little Help needed moving to and from a bed to a chair (including a wheelchair)?: A Little Help needed standing up from a  chair using your arms (e.g., wheelchair or bedside chair)?: A Little Help needed to walk in hospital room?: A Little Help needed climbing 3-5 steps with a railing? : A Little 6 Click Score: 18    End of Session Equipment Utilized During Treatment: Gait belt Activity Tolerance: Patient tolerated treatment well Patient left: in chair;with call bell/phone within reach;with chair alarm set Nurse Communication: Mobility status(informed nursing his refused primofit placement.) PT Visit Diagnosis: Unsteadiness on feet (R26.81);Muscle weakness (generalized) (M62.81);Dizziness and giddiness (R42)     Time: [id number] PT Time Calculation (min) (ACUTE ONLY): 15 min  Charges:  $Gait Training: 8-22 mins                     Erasmo Leventhal , PTA Acute Rehabilitation Services Pager [phone] Office [phone]     Lace Chenevert Eli Hose 10/24/2019, 12:14 PM

## 2019-10-24 NOTE — Plan of Care (Signed)
  Problem: Education: °Goal: Knowledge of General Education information will improve °Description: Including pain rating scale, medication(s)/side effects and non-pharmacologic comfort measures °Outcome: Progressing °  °Problem: Health Behavior/Discharge Planning: °Goal: Ability to manage health-related needs will improve °Outcome: Progressing °  °Problem: Activity: °Goal: Risk for activity intolerance will decrease °Outcome: Progressing °  °Problem: Nutrition: °Goal: Adequate nutrition will be maintained °Outcome: Progressing °  °Problem: Coping: °Goal: Level of anxiety will decrease °Outcome: Progressing °  °Problem: Elimination: °Goal: Will not experience complications related to bowel motility °Outcome: Progressing °Goal: Will not experience complications related to urinary retention °Outcome: Progressing °  °Problem: Safety: °Goal: Ability to remain free from injury will improve °Outcome: Progressing °  °Problem: Skin Integrity: °Goal: Risk for impaired skin integrity will decrease °Outcome: Progressing °  °

## 2019-10-24 NOTE — Progress Notes (Signed)
PROGRESS NOTE    Justin [name]  [id number] DOB: 12-24-1928 DOA: 10/21/2019 PCP: Mosie Lukes, MD   Brief Narrative:  Justin [name] is a 86 y.o. male with medical history significant of BPH with recurrent UTIs, CAD, severe aortic stenosis, Parkinson's disease, borderline systolic CHF with EF 40 to 45%, basal cell carcinomas, presented with new onset weakness and confusion.  Patient underwent Mohs procedure and nitrogen treatment of scalp basal cells last week, and has been feeling weak since then.  Denied any fever chills, no dysuria, but patient wife noticed patient has increased urinary frequency.  Patient denied any chest pain or short of breath.  He continues to complain about the pain on the scalp incision sites. In ED UA showed UTI, hospitalist called for admission.  Assessment & Plan:   Active Problems:   BPH with urinary obstruction   Acute lower UTI   Sepsis (HCC)   Generalized weakness   CHF (congestive heart failure) (HCC)   Chronic combined systolic and diastolic CHF (congestive heart failure) (HCC)   Impaired ambulation   Acute metabolic encephalopathy secondary to sepsis and gram-negative rod UTI, POA Sundowning/hospital delirium overnight Continue antibiotics, mental status improving this morning per wife at bedside but not yet back to baseline, still somewhat sluggish but answering questions more appropriately Much more well controlled overnight, telesitter this morning discontinued, plan for discharge if can tolerate 24 hours without sitter Completing IV antibiotic course today Follow cultures, patient remains afebrile without leukocytosis  Transient episode of paroxysmal afib on telemetry resolved Likely provoked in the setting of above Resolved early morning Continue home amiodarone Not a candidate for anticoagulation given markedly high risk of falls Chadsvasc 3  Uncontrolled hypertension, resolved Likely in the setting of above  Recent medication  changes in the outpatient setting including discontinued amlodipine and initiation of Lasix (which he did not tolerate due to increased urinary output )  Chronic systolic CHF no acute exacerbation Appears euvolemic, follow I's and O's 1 dose of Bumex at admission  Parkinson disease Fairly controlled, continue current Sinemet dosage  Ambulatory dysfunction in the setting of above with concurrent generalized weakness Likely from UTI, antibiotics and PT evaluation -evaluate for possible outpatient versus inpatient physical therapy at discharge  Aortic stenosis, chronic As per cardiology, conservative management, avoid overdiuresis.  Scalp sutures after skin biopsy To be removed by nursing, cleared with dermatology by patient and wife for removal today as he will not make his outpatient appointment   DVT prophylaxis: Lovenox Code Status: DNR Family Communication: Wife at bedside Disposition Plan:  Continue as inpatient requiring IV antibiotics, close monitoring in the setting of mental status changes. PT evaluating, family requesting SNF placement which seems reasonable given patient's ambulatory dysfunction with acute metabolic encephalopathy. Consults called:  None Admission status:  Inpatient, continues to require IV antibiotics in the setting of sepsis, UTI with altered mental status poor p.o. intake and need for further evaluation and treatment with physical therapy due to ambulatory dysfunction  Subjective: Overnight patient had episodes of sundowning/delirium, wife at bedside, reports patient did not calm down until 2 AM with very little sleep. Patient's review of systems is markedly limited given his mental status although he denies any chest pain shortness of breath nausea vomiting diarrhea constipation  Objective: Vitals:   10/23/19 0535 10/23/19 1401 10/23/19 2100 10/24/19 0440  BP: 120/67 118/87 (!) 146/99 137/79  Pulse: (!) 59 (!) 103 (!) 101 65  Resp: 16 16 18 18    Temp: [name])  97.5 F (36.4 C) 97.8 F (36.6 C) 98.4 F (36.9 C) 97.7 F (36.5 C)  TempSrc: Oral Oral Oral Oral  SpO2: 96% 98% 97% 94%   No intake or output data in the 24 hours ending 10/24/19 0719 There were no vitals filed for this visit.  Examination:  General:  Pleasantly resting in bed, No acute distress. Alert to person only HEENT:  Normocephalic atraumatic.  Sclerae nonicteric, noninjected.  Extraocular movements intact bilaterally. Neck:  Without mass or deformity.  Trachea is midline. Lungs:  Clear to auscultate bilaterally without rhonchi, wheeze, or rales. Heart:  Regular rate and rhythm. Left sternal border 4 out of 6 murmur with notable click Abdomen:  Soft, nontender, nondistended.  Without guarding or rebound. Extremities: Without cyanosis, clubbing, edema, or obvious deformity. Vascular:  Dorsalis pedis and posterior tibial pulses palpable bilaterally. Skin:  Warm and dry, multiple excoriations diffusely, patient does have noted superior temporal sutures from previous biopsy, appear clean dry intact without purulence or erythema   Data Reviewed: I have personally reviewed following labs and imaging studies  CBC: Recent Labs  Lab 10/21/19 1003 10/22/19 0527 10/23/19 0232 10/24/19 0120  WBC 11.5* 12.9* 9.2 9.0  HGB 13.8 14.0 11.8* 11.9*  HCT 42.5 42.6 35.3* 35.6*  MCV 89.1 87.5 86.3 86.2  PLT 361 370 304 [id number]   Basic Metabolic Panel: Recent Labs  Lab 10/21/19 1003 10/22/19 0527 10/23/19 0232 10/24/19 0120  NA 135 137 133* 135  K 4.2 4.0 3.6 4.0  CL 103 103 102 100  CO2 24 22 24 25   GLUCOSE 101* 93 106* 101*  BUN 17 13 19 21   CREATININE 1.23 1.15 1.13 1.23  CALCIUM 8.7* 8.8* 8.3* 8.4*   GFR: Estimated Creatinine Clearance: 35.4 mL/min (by C-G formula based on SCr of 1.23 mg/dL). Liver Function Tests: No results for input(s): AST, ALT, ALKPHOS, BILITOT, PROT, ALBUMIN in the last 168 hours. No results for input(s): LIPASE, AMYLASE in the last 168  hours. No results for input(s): AMMONIA in the last 168 hours. Coagulation Profile: No results for input(s): INR, PROTIME in the last 168 hours. Cardiac Enzymes: No results for input(s): CKTOTAL, CKMB, CKMBINDEX, TROPONINI in the last 168 hours. BNP (last 3 results) No results for input(s): PROBNP in the last 8760 hours. HbA1C: No results for input(s): HGBA1C in the last 72 hours. CBG: No results for input(s): GLUCAP in the last 168 hours. Lipid Profile: No results for input(s): CHOL, HDL, LDLCALC, TRIG, CHOLHDL, LDLDIRECT in the last 72 hours. Thyroid Function Tests: Recent Labs    10/21/19 1703  TSH 3.256   Anemia Panel: No results for input(s): VITAMINB12, FOLATE, FERRITIN, TIBC, IRON, RETICCTPCT in the last 72 hours. Sepsis Labs: No results for input(s): PROCALCITON, LATICACIDVEN in the last 168 hours.  Recent Results (from the past 240 hour(s))  Urine culture     Status: Abnormal   Collection Time: 10/21/19 11:13 AM   Specimen: Urine, Clean Catch  Result Value Ref Range Status   Specimen Description URINE, CLEAN CATCH  Final   Special Requests   Final    Normal Performed at [facility], [address], [place], [place] [address]    Culture >=100,000 COLONIES/mL KLEBSIELLA PNEUMONIAE (A)  Final   Report Status 10/23/2019 FINAL  Final   Organism ID, Bacteria KLEBSIELLA PNEUMONIAE (A)  Final      Susceptibility   Klebsiella pneumoniae - MIC*    AMPICILLIN RESISTANT Resistant     CEFAZOLIN <=4 SENSITIVE Sensitive  CEFTRIAXONE <=1 SENSITIVE Sensitive     CIPROFLOXACIN <=0.25 SENSITIVE Sensitive     GENTAMICIN <=1 SENSITIVE Sensitive     IMIPENEM <=0.25 SENSITIVE Sensitive     NITROFURANTOIN 64 INTERMEDIATE Intermediate     TRIMETH/SULFA <=20 SENSITIVE Sensitive     AMPICILLIN/SULBACTAM 4 SENSITIVE Sensitive     PIP/TAZO <=4 SENSITIVE Sensitive     * >=100,000 COLONIES/mL KLEBSIELLA PNEUMONIAE  SARS CORONAVIRUS 2 (TAT 6-24 HRS) Nasopharyngeal Nasopharyngeal  Swab     Status: None   Collection Time: 10/21/19 12:35 PM   Specimen: Nasopharyngeal Swab  Result Value Ref Range Status   SARS Coronavirus 2 NEGATIVE NEGATIVE Final    Comment: (NOTE) SARS-CoV-2 target nucleic acids are NOT DETECTED. The SARS-CoV-2 RNA is generally detectable in upper and lower respiratory specimens during the acute phase of infection. Negative results do not preclude SARS-CoV-2 infection, do not rule out co-infections with other pathogens, and should not be used as the sole basis for treatment or other patient management decisions. Negative results must be combined with clinical observations, patient history, and epidemiological information. The expected result is Negative. Fact Sheet for Patients: SugarRoll.be Fact Sheet for Healthcare Providers: https://www.woods-mathews.com/ This test is not yet approved or cleared by the [place] FDA and  has been authorized for detection and/or diagnosis of SARS-CoV-2 by FDA under an Emergency Use Authorization (EUA). This EUA will remain  in effect (meaning this test can be used) for the duration of the COVID-19 declaration under Section 56 4(b)(1) of the Act, 21 U.S.C. section 360bbb-3(b)(1), unless the authorization is terminated or revoked sooner. Performed at [facility], [place] [address] [place], [place] [address]   Culture, blood (routine x 2)     Status: None (Preliminary result)   Collection Time: 10/21/19 12:43 PM   Specimen: BLOOD  Result Value Ref Range Status   Specimen Description BLOOD BLOOD RIGHT FOREARM  Final   Special Requests   Final    BOTTLES DRAWN AEROBIC AND ANAEROBIC Blood Culture results may not be optimal due to an inadequate volume of blood received in culture bottles   Culture   Final    NO GROWTH 2 DAYS Performed at [facility], [place] [address] [place], [place] [address]    Report Status PENDING  Incomplete  Culture, blood (routine  x 2)     Status: None (Preliminary result)   Collection Time: 10/21/19 12:50 PM   Specimen: BLOOD  Result Value Ref Range Status   Specimen Description BLOOD BLOOD LEFT WRIST  Final   Special Requests   Final    BOTTLES DRAWN AEROBIC AND ANAEROBIC Blood Culture results may not be optimal due to an inadequate volume of blood received in culture bottles   Culture   Final    NO GROWTH 2 DAYS Performed at [facility], [place] [address] [place], [place] [address]    Report Status PENDING  Incomplete    Radiology Studies: No results found.  Scheduled Meds: . acidophilus  1 capsule Oral QPC breakfast  . amiodarone  200 mg Oral Daily  . aspirin EC  81 mg Oral Daily  . carbidopa-levodopa  1.5 tablet Oral TID  . docusate sodium  100 mg Oral QPC breakfast  . enoxaparin (LOVENOX) injection  40 mg Subcutaneous Q24H  . ferrous sulfate  325 mg Oral QPC supper  . finasteride  5 mg Oral QPC breakfast  . hydrALAZINE  10 mg Oral Q8H  . levothyroxine  75  mcg Oral Q0600  . multivitamin with minerals  1 tablet Oral QPC breakfast  . multivitamin with minerals  1 tablet Oral QPC breakfast  . pravastatin  20 mg Oral q1800  . sodium chloride flush  3 mL Intravenous Once   Continuous Infusions: . cefTRIAXone (ROCEPHIN)  IV 1 g (10/23/19 1009)     LOS: 3 days   Time spent: 35 minutes  Little Ishikawa, DO [facility]  If 7PM-7AM, please contact night-coverage [email or web address]  10/24/2019, 7:19 AM

## 2019-10-25 DIAGNOSIS — R000 Tachycardia, unspecified: Secondary | ICD-10-CM | POA: Diagnosis not present

## 2019-10-25 DIAGNOSIS — Z515 Encounter for palliative care: Secondary | ICD-10-CM | POA: Diagnosis not present

## 2019-10-25 DIAGNOSIS — I5040 Unspecified combined systolic (congestive) and diastolic (congestive) heart failure: Secondary | ICD-10-CM | POA: Diagnosis not present

## 2019-10-25 DIAGNOSIS — R079 Chest pain, unspecified: Secondary | ICD-10-CM | POA: Diagnosis not present

## 2019-10-25 DIAGNOSIS — I482 Chronic atrial fibrillation, unspecified: Secondary | ICD-10-CM | POA: Diagnosis not present

## 2019-10-25 DIAGNOSIS — N3090 Cystitis, unspecified without hematuria: Secondary | ICD-10-CM | POA: Diagnosis not present

## 2019-10-25 DIAGNOSIS — E039 Hypothyroidism, unspecified: Secondary | ICD-10-CM | POA: Diagnosis not present

## 2019-10-25 DIAGNOSIS — R0602 Shortness of breath: Secondary | ICD-10-CM | POA: Diagnosis not present

## 2019-10-25 DIAGNOSIS — R262 Difficulty in walking, not elsewhere classified: Secondary | ICD-10-CM | POA: Diagnosis not present

## 2019-10-25 DIAGNOSIS — I480 Paroxysmal atrial fibrillation: Secondary | ICD-10-CM | POA: Diagnosis not present

## 2019-10-25 DIAGNOSIS — H9190 Unspecified hearing loss, unspecified ear: Secondary | ICD-10-CM | POA: Diagnosis not present

## 2019-10-25 DIAGNOSIS — I447 Left bundle-branch block, unspecified: Secondary | ICD-10-CM | POA: Diagnosis not present

## 2019-10-25 DIAGNOSIS — R0603 Acute respiratory distress: Secondary | ICD-10-CM | POA: Diagnosis not present

## 2019-10-25 DIAGNOSIS — I252 Old myocardial infarction: Secondary | ICD-10-CM | POA: Diagnosis not present

## 2019-10-25 DIAGNOSIS — R0789 Other chest pain: Secondary | ICD-10-CM | POA: Diagnosis not present

## 2019-10-25 DIAGNOSIS — R627 Adult failure to thrive: Secondary | ICD-10-CM | POA: Diagnosis not present

## 2019-10-25 DIAGNOSIS — Z66 Do not resuscitate: Secondary | ICD-10-CM | POA: Diagnosis not present

## 2019-10-25 DIAGNOSIS — I5023 Acute on chronic systolic (congestive) heart failure: Secondary | ICD-10-CM | POA: Diagnosis not present

## 2019-10-25 DIAGNOSIS — A419 Sepsis, unspecified organism: Secondary | ICD-10-CM | POA: Diagnosis not present

## 2019-10-25 DIAGNOSIS — I110 Hypertensive heart disease with heart failure: Secondary | ICD-10-CM | POA: Diagnosis not present

## 2019-10-25 DIAGNOSIS — I5042 Chronic combined systolic (congestive) and diastolic (congestive) heart failure: Secondary | ICD-10-CM | POA: Diagnosis not present

## 2019-10-25 DIAGNOSIS — R6520 Severe sepsis without septic shock: Secondary | ICD-10-CM | POA: Diagnosis not present

## 2019-10-25 DIAGNOSIS — Z951 Presence of aortocoronary bypass graft: Secondary | ICD-10-CM | POA: Diagnosis not present

## 2019-10-25 DIAGNOSIS — G20 Parkinson's disease: Secondary | ICD-10-CM | POA: Diagnosis not present

## 2019-10-25 DIAGNOSIS — Z7982 Long term (current) use of aspirin: Secondary | ICD-10-CM | POA: Diagnosis not present

## 2019-10-25 DIAGNOSIS — J9690 Respiratory failure, unspecified, unspecified whether with hypoxia or hypercapnia: Secondary | ICD-10-CM | POA: Diagnosis not present

## 2019-10-25 DIAGNOSIS — J9601 Acute respiratory failure with hypoxia: Secondary | ICD-10-CM | POA: Diagnosis not present

## 2019-10-25 DIAGNOSIS — I4811 Longstanding persistent atrial fibrillation: Secondary | ICD-10-CM | POA: Diagnosis not present

## 2019-10-25 DIAGNOSIS — N400 Enlarged prostate without lower urinary tract symptoms: Secondary | ICD-10-CM | POA: Diagnosis not present

## 2019-10-25 DIAGNOSIS — Z85828 Personal history of other malignant neoplasm of skin: Secondary | ICD-10-CM | POA: Diagnosis not present

## 2019-10-25 DIAGNOSIS — I454 Nonspecific intraventricular block: Secondary | ICD-10-CM | POA: Diagnosis not present

## 2019-10-25 DIAGNOSIS — N401 Enlarged prostate with lower urinary tract symptoms: Secondary | ICD-10-CM | POA: Diagnosis not present

## 2019-10-25 DIAGNOSIS — I509 Heart failure, unspecified: Secondary | ICD-10-CM | POA: Diagnosis not present

## 2019-10-25 DIAGNOSIS — N138 Other obstructive and reflux uropathy: Secondary | ICD-10-CM | POA: Diagnosis not present

## 2019-10-25 DIAGNOSIS — G9340 Encephalopathy, unspecified: Secondary | ICD-10-CM | POA: Diagnosis not present

## 2019-10-25 DIAGNOSIS — N390 Urinary tract infection, site not specified: Secondary | ICD-10-CM | POA: Diagnosis not present

## 2019-10-25 DIAGNOSIS — I350 Nonrheumatic aortic (valve) stenosis: Secondary | ICD-10-CM | POA: Diagnosis not present

## 2019-10-25 DIAGNOSIS — R2681 Unsteadiness on feet: Secondary | ICD-10-CM | POA: Diagnosis not present

## 2019-10-25 DIAGNOSIS — R0682 Tachypnea, not elsewhere classified: Secondary | ICD-10-CM | POA: Diagnosis not present

## 2019-10-25 DIAGNOSIS — I4891 Unspecified atrial fibrillation: Secondary | ICD-10-CM | POA: Diagnosis not present

## 2019-10-25 DIAGNOSIS — M6281 Muscle weakness (generalized): Secondary | ICD-10-CM | POA: Diagnosis not present

## 2019-10-25 DIAGNOSIS — R531 Weakness: Secondary | ICD-10-CM | POA: Diagnosis not present

## 2019-10-25 LAB — BASIC METABOLIC PANEL
Anion gap: 11 (ref 5–15)
BUN: 22 mg/dL (ref 8–23)
CO2: 21 mmol/L — ABNORMAL LOW (ref 22–32)
Calcium: 8.6 mg/dL — ABNORMAL LOW (ref 8.9–10.3)
Chloride: 99 mmol/L (ref 98–111)
Creatinine, Ser: 1.26 mg/dL — ABNORMAL HIGH (ref 0.61–1.24)
GFR calc Af Amer: 58 mL/min — ABNORMAL LOW (ref >60)
GFR calc non Af Amer: 50 mL/min — ABNORMAL LOW (ref >60)
Glucose, Bld: 142 mg/dL — ABNORMAL HIGH (ref 70–99)
Potassium: 3.8 mmol/L (ref 3.5–5.1)
Sodium: 131 mmol/L — ABNORMAL LOW (ref 135–145)

## 2019-10-25 LAB — CBC
HCT: 37.4 % — ABNORMAL LOW (ref 39.0–52.0)
Hemoglobin: 12.6 g/dL — ABNORMAL LOW (ref 13.0–17.0)
MCH: 29 pg (ref 26.0–34.0)
MCHC: 33.7 g/dL (ref 30.0–36.0)
MCV: 86 fL (ref 80.0–100.0)
Platelets: 344 K/uL (ref 150–400)
RBC: 4.35 MIL/uL (ref 4.22–5.81)
RDW: 14.6 % (ref 11.5–15.5)
WBC: 8.8 K/uL (ref 4.0–10.5)
nRBC: 0 % (ref 0.0–0.2)

## 2019-10-25 MED ORDER — HYDRALAZINE HCL 10 MG PO TABS: 10.0000 mg | ORAL_TABLET | Freq: Three times a day (TID) | ORAL | 0 refills | Status: AC

## 2019-10-25 NOTE — Social Work (Signed)
Clinical Social Worker facilitated patient discharge including contacting patient family and facility to confirm patient discharge plans.  Clinical information faxed to facility and family agreeable with plan.  CSW aware pt wife would like to transport to [facility] ([facility]) RN to call [phone] ext [phone]  with report prior to discharge.  Clinical Social Worker will sign off for now as social work intervention is no longer needed. Please consult [place] again if new need arises.  Westley Hummer, MSW, LCSW Clinical Social Worker

## 2019-10-25 NOTE — Progress Notes (Signed)
Report called and given to [facility] at [facility] living. Wife transporting pt via private car.

## 2019-10-25 NOTE — Discharge Summary (Signed)
Physician Discharge Summary  Justin [name] [id number] DOB: 1929/05/23 DOA: 10/21/2019  PCP: Justin Lukes, MD  Admit date: 10/21/2019 Discharge date: 10/25/2019  Admitted From: Independent living facility Disposition: SNF  Recommendations for Outpatient Follow-up:  1. Follow up with PCP in 1-2 weeks 2. Please obtain BMP/CBC in one week   Discharge Condition: Stable CODE STATUS: DNR Diet recommendation: As tolerated  Brief/Interim Summary: Justin Levi Riceis a 86 y.o.malewith medical history significant ofBPH with recurrent UTIs, CAD, severe aortic stenosis, Parkinson's disease, borderline systolic CHF with EF 40 to 45%, basal cell carcinomas, presented with new onset weakness and confusion. Patient underwent Mohsprocedure and nitrogen treatment of scalp basal cells last week, and has been feeling weak since then. Denied any fever chills, no dysuria, but patient wifenoticed patient has increased urinary frequency. Patient denied any chest pain or short of breath.He continues to complain about the pain on the scalp incision sites. In ED UA showed UTI, hospitalist called for admission  Admitted as above with acute metabolic encephalopathy on chronic dementia with likely UTI given lab abnormalities and symptoms.  Patient resolved quite quickly with IV antibiotics with ceftriaxone, now completed course.  Have some ongoing ambulatory dysfunction in the setting of acute infection, improving but not yet back to baseline, PT recommending SNF placement for ongoing physical therapy with ultimate disposition likely back to assisted living facility with wife.  Patient had some mild delirium/sundowning initially but this too resolved as family was able to stay at bedside with patient.  He also had his scalp sutures removed from recent dermatology skin biopsy.  At this time patient is otherwise stable and wife is agreeable for discharge to SNF now that he has completed his antibiotic course to  continue physical therapy.  Will need follow-up with PCP in the next 1 to 2 weeks as scheduled for repeat labs and ongoing medical management of chronic comorbid conditions.  Patient will need follow-up with otology as indicated for reevaluation of pathology of biopsy as well as for future suture care.  Discharge Diagnoses:  Active Problems:   BPH with urinary obstruction   Acute lower UTI   Sepsis (HCC)   Generalized weakness   CHF (congestive heart failure) (HCC)   Chronic combined systolic and diastolic CHF (congestive heart failure) (HCC)   Impaired ambulation    Discharge Instructions  Discharge Instructions    Call MD for:  difficulty breathing, headache or visual disturbances   Complete by: As directed    Call MD for:  extreme fatigue   Complete by: As directed    Call MD for:  hives   Complete by: As directed    Call MD for:  persistant dizziness or light-headedness   Complete by: As directed    Call MD for:  persistant nausea and vomiting   Complete by: As directed    Call MD for:  redness, tenderness, or signs of infection (pain, swelling, redness, odor or green/yellow discharge around incision site)   Complete by: As directed    Call MD for:  severe uncontrolled pain   Complete by: As directed    Call MD for:  temperature >100.4   Complete by: As directed    Diet - low sodium heart healthy   Complete by: As directed    Increase activity slowly   Complete by: As directed      Allergies as of 10/25/2019      Reactions   Morphine Other (See Comments)   Severe confusion  Morphine And Related Other (See Comments)   Severe confusion   Diltiazem Hcl Other (See Comments)   bradycardia   Metoprolol Other (See Comments)   bradycardia   Toradol [ketorolac Tromethamine] Other (See Comments)   Dizzy and light headed   Hydrocodone Other (See Comments)   hallucinations   Sulfa Antibiotics Other (See Comments)   Caused extreme weakness/low sodium  Feb. 2020 per  wife Caused extreme weakness/low sodium  Feb. 2020 per wife   Dutasteride Diarrhea   GI upset and incontinence      Medication List    TAKE these medications   acetaminophen 500 MG tablet Commonly known as: TYLENOL Take 500 mg by mouth daily as needed for moderate pain.   amiodarone 200 MG tablet Commonly known as: PACERONE TAKE ONE (1) TABLET BY MOUTH EVERY DAY What changed: See the new instructions.   aspirin EC 81 MG tablet Take 1 tablet (81 mg total) by mouth daily.   beta carotene w/minerals tablet Take 1 tablet by mouth daily after breakfast.   carbidopa-levodopa 25-100 MG tablet Commonly known as: SINEMET IR Take 1.5 tablets by mouth 3 (three) times daily.   docusate sodium 100 MG capsule Commonly known as: COLACE Take 100 mg by mouth daily after breakfast.   ferrous sulfate 325 (65 FE) MG tablet Take 325 mg by mouth daily after supper.   finasteride 5 MG tablet Commonly known as: PROSCAR Take 5 mg by mouth daily after breakfast.   hydrALAZINE 10 MG tablet Commonly known as: APRESOLINE Take 1 tablet (10 mg total) by mouth every 8 (eight) hours.   levothyroxine 75 MCG tablet Commonly known as: SYNTHROID TAKE 1 TABLET BY MOUTH DAILY BEFORE BREAKFAST What changed:   how much to take  how to take this  when to take this  additional instructions   lovastatin 20 MG tablet Commonly known as: MEVACOR TAKE 1 TABLET DAILY AT 6PM What changed: See the new instructions.   meclizine 25 MG tablet Commonly known as: ANTIVERT Take 1 tablet (25 mg total) by mouth 3 (three) times daily as needed for dizziness.   multivitamin with minerals Tabs tablet Take 1 tablet by mouth daily after breakfast.   nitroGLYCERIN 0.4 MG SL tablet Commonly known as: NITROSTAT Place 1 tablet (0.4 mg total) under the tongue every 5 (five) minutes as needed for chest pain.   PROBIOTIC DAILY PO Take 1 capsule by mouth daily after breakfast.   THERATEARS OP Place 1 drop into  both eyes daily as needed (dry eyes).      Contact information for after-discharge care    Destination    [name] .   Service: Skilled Nursing Contact information: [address] [place] [id number] [phone]             Allergies  Allergen Reactions  . Morphine Other (See Comments)    Severe confusion  . Morphine And Related Other (See Comments)    Severe confusion  . Diltiazem Hcl Other (See Comments)    bradycardia  . Metoprolol Other (See Comments)    bradycardia  . Toradol [Ketorolac Tromethamine] Other (See Comments)    Dizzy and light headed  . Hydrocodone Other (See Comments)    hallucinations  . Sulfa Antibiotics Other (See Comments)    Caused extreme weakness/low sodium  Feb. 2020 per wife Caused extreme weakness/low sodium  Feb. 2020 per wife  . Dutasteride Diarrhea    GI upset and incontinence  Procedures/Studies: DG Chest 2 View  Result Date: 10/02/2019 CLINICAL DATA:  Patient on amiodarone. EXAM: CHEST - 2 VIEW COMPARISON:  PA and lateral chest 03/21/2019 in 02/16/2018. FINDINGS: Calcified mediastinal and hilar lymph nodes are again seen. Lungs are clear. Heart size is normal. The patient is status post CABG. No pneumothorax or pleural effusion. No acute bony abnormality. Postoperative change right shoulder noted. IMPRESSION: No acute disease. No change in calcified mediastinal and hilar lymph nodes consistent with old granulomatous disease. Electronically Signed   By: Inge Rise M.D.   On: 10/02/2019 15:12   CT Head Wo Contrast  Result Date: 10/21/2019 CLINICAL DATA:  In cephalopathy, hallucinations EXAM: CT HEAD WITHOUT CONTRAST TECHNIQUE: Contiguous axial images were obtained from the base of the skull through the vertex without intravenous contrast. COMPARISON:  08/13/2018 FINDINGS: Brain: No evidence of acute infarction, hemorrhage, hydrocephalus, extra-axial collection or mass lesion/mass effect.  Extensive periventricular and deep white matter hypodensity with global volume loss. Vascular: No hyperdense vessel or unexpected calcification. Skull: Normal. Negative for fracture or focal lesion. Sinuses/Orbits: No acute finding. Other: None. IMPRESSION: No acute intracranial pathology. Small-vessel white matter disease and global volume loss in keeping with advanced patient age. Electronically Signed   By: Eddie Candle M.D.   On: 10/21/2019 14:18   DG Chest Port 1 View  Result Date: 10/21/2019 CLINICAL DATA:  Weakness, hallucinations. EXAM: PORTABLE CHEST 1 VIEW COMPARISON:  10/02/2019 FINDINGS: Cardiomediastinal contours and hilar structures are unchanged. The cardiac enlargement following median sternotomy for CABG and calcified lymph nodes about the mediastinum similar to the prior study. Lungs are clear. No consolidation. No pleural effusion. Marked glenohumeral degenerative changes worse on the LEFT. IMPRESSION: No acute cardiopulmonary disease. Signs of granulomatous disease with calcified lymph nodes in the mediastinum and hila. Electronically Signed   By: Zetta Bills M.D.   On: 10/21/2019 13:02     Subjective: No acute issues or events overnight, patient pleasantly demented this morning at baseline, review systems somewhat limited but denies headache, fevers, chills, nausea, vomiting, diarrhea, constipation, chest pain, shortness of breath.   Discharge Exam: Vitals:   10/24/19 2009 10/25/19 0525  BP: 136/82 (!) 142/94  Pulse: 69 73  Resp: 17 17  Temp: 97.9 F (36.6 C) 98 F (36.7 C)  SpO2: 99% 96%   Vitals:   10/24/19 1505 10/24/19 1519 10/24/19 2009 10/25/19 0525  BP: 124/76 (!) 155/95 136/82 (!) 142/94  Pulse:  67 69 73  Resp:  18 17 17   Temp:  97.6 F (36.4 C) 97.9 F (36.6 C) 98 F (36.7 C)  TempSrc:  Oral Oral Oral  SpO2:  98% 99% 96%    General:  Pleasantly resting in bed, No acute distress. Alert to person only HEENT:  Normocephalic atraumatic.  Sclerae  nonicteric, noninjected.  Extraocular movements intact bilaterally. Neck:  Without mass or deformity.  Trachea is midline. Lungs:  Clear to auscultate bilaterally without rhonchi, wheeze, or rales. Heart:  Regular rate and rhythm. Left sternal border 4 out of 6 murmur with notable click Abdomen:  Soft, nontender, nondistended.  Without guarding or rebound. Extremities: Without cyanosis, clubbing, edema, or obvious deformity. Vascular:  Dorsalis pedis and posterior tibial pulses palpable bilaterally. Skin:  Warm and dry, multiple excoriations diffusely, patient does have noted superior temporal sutures from previous biopsy, appear clean dry intact without purulence or erythema   The results of significant diagnostics from this hospitalization (including imaging, microbiology, ancillary and laboratory) are listed below for reference.  Microbiology: Recent Results (from the past 240 hour(s))  Urine culture     Status: Abnormal   Collection Time: 10/21/19 11:13 AM   Specimen: Urine, Clean Catch  Result Value Ref Range Status   Specimen Description URINE, CLEAN CATCH  Final   Special Requests   Final    Normal Performed at [facility], [address] [place], [place] [address]    Culture >=100,000 COLONIES/mL KLEBSIELLA PNEUMONIAE (A)  Final   Report Status 10/23/2019 FINAL  Final   Organism ID, Bacteria KLEBSIELLA PNEUMONIAE (A)  Final      Susceptibility   Klebsiella pneumoniae - MIC*    AMPICILLIN RESISTANT Resistant     CEFAZOLIN <=4 SENSITIVE Sensitive     CEFTRIAXONE <=1 SENSITIVE Sensitive     CIPROFLOXACIN <=0.25 SENSITIVE Sensitive     GENTAMICIN <=1 SENSITIVE Sensitive     IMIPENEM <=0.25 SENSITIVE Sensitive     NITROFURANTOIN 64 INTERMEDIATE Intermediate     TRIMETH/SULFA <=20 SENSITIVE Sensitive     AMPICILLIN/SULBACTAM 4 SENSITIVE Sensitive     PIP/TAZO <=4 SENSITIVE Sensitive     * >=100,000 COLONIES/mL KLEBSIELLA PNEUMONIAE  SARS CORONAVIRUS 2 (TAT 6-24  HRS) Nasopharyngeal Nasopharyngeal Swab     Status: None   Collection Time: 10/21/19 12:35 PM   Specimen: Nasopharyngeal Swab  Result Value Ref Range Status   SARS Coronavirus 2 NEGATIVE NEGATIVE Final    Comment: (NOTE) SARS-CoV-2 target nucleic acids are NOT DETECTED. The SARS-CoV-2 RNA is generally detectable in upper and lower respiratory specimens during the acute phase of infection. Negative results do not preclude SARS-CoV-2 infection, do not rule out co-infections with other pathogens, and should not be used as the sole basis for treatment or other patient management decisions. Negative results must be combined with clinical observations, patient history, and epidemiological information. The expected result is Negative. Fact Sheet for Patients: SugarRoll.be Fact Sheet for Healthcare Providers: https://www.woods-mathews.com/ This test is not yet approved or cleared by the [place] FDA and  has been authorized for detection and/or diagnosis of SARS-CoV-2 by FDA under an Emergency Use Authorization (EUA). This EUA will remain  in effect (meaning this test can be used) for the duration of the COVID-19 declaration under Section 56 4(b)(1) of the Act, 21 U.S.C. section 360bbb-3(b)(1), unless the authorization is terminated or revoked sooner. Performed at [facility], [place] [address], [place], [place] [address]   Culture, blood (routine x 2)     Status: None (Preliminary result)   Collection Time: 10/21/19 12:43 PM   Specimen: BLOOD  Result Value Ref Range Status   Specimen Description BLOOD BLOOD RIGHT FOREARM  Final   Special Requests   Final    BOTTLES DRAWN AEROBIC AND ANAEROBIC Blood Culture results may not be optimal due to an inadequate volume of blood received in culture bottles   Culture   Final    NO GROWTH 3 DAYS Performed at [facility], [place] [address], [place], [place] [address]    Report Status PENDING   Incomplete  Culture, blood (routine x 2)     Status: None (Preliminary result)   Collection Time: 10/21/19 12:50 PM   Specimen: BLOOD  Result Value Ref Range Status   Specimen Description BLOOD BLOOD LEFT WRIST  Final   Special Requests   Final    BOTTLES DRAWN AEROBIC AND ANAEROBIC Blood Culture results may not be optimal due to an inadequate volume of blood received in culture bottles  Culture   Final    NO GROWTH 3 DAYS Performed at [facility], [place] [address], [place], [place] [address]    Report Status PENDING  Incomplete     Labs: BNP (last 3 results) No results for input(s): BNP in the last 8760 hours. Basic Metabolic Panel: Recent Labs  Lab 10/21/19 1003 10/22/19 0527 10/23/19 0232 10/24/19 0120 10/25/19 0215  NA 135 137 133* 135 131*  K 4.2 4.0 3.6 4.0 3.8  CL 103 103 102 100 99  CO2 24 22 24 25  21*  GLUCOSE 101* 93 106* 101* 142*  BUN 17 13 19 21 22   CREATININE 1.23 1.15 1.13 1.23 1.26*  CALCIUM 8.7* 8.8* 8.3* 8.4* 8.6*   Liver Function Tests: No results for input(s): AST, ALT, ALKPHOS, BILITOT, PROT, ALBUMIN in the last 168 hours. No results for input(s): LIPASE, AMYLASE in the last 168 hours. No results for input(s): AMMONIA in the last 168 hours. CBC: Recent Labs  Lab 10/21/19 1003 10/22/19 0527 10/23/19 0232 10/24/19 0120 10/25/19 0215  WBC 11.5* 12.9* 9.2 9.0 8.8  HGB 13.8 14.0 11.8* 11.9* 12.6*  HCT 42.5 42.6 35.3* 35.6* 37.4*  MCV 89.1 87.5 86.3 86.2 86.0  PLT 361 370 304 352 344   Cardiac Enzymes: No results for input(s): CKTOTAL, CKMB, CKMBINDEX, TROPONINI in the last 168 hours. BNP: Invalid input(s): POCBNP CBG: No results for input(s): GLUCAP in the last 168 hours. D-Dimer No results for input(s): DDIMER in the last 72 hours. Hgb A1c No results for input(s): HGBA1C in the last 72 hours. Lipid Profile No results for input(s): CHOL, HDL, LDLCALC, TRIG, CHOLHDL, LDLDIRECT in the last 72 hours. Thyroid function studies No  results for input(s): TSH, T4TOTAL, T3FREE, THYROIDAB in the last 72 hours.  Invalid input(s): FREET3 Anemia work up No results for input(s): VITAMINB12, FOLATE, FERRITIN, TIBC, IRON, RETICCTPCT in the last 72 hours. Urinalysis    Component Value Date/Time   COLORURINE YELLOW 10/21/2019 1113   APPEARANCEUR TURBID (A) 10/21/2019 1113   LABSPEC 1.006 10/21/2019 1113   PHURINE 7.0 10/21/2019 1113   GLUCOSEU NEGATIVE 10/21/2019 1113   GLUCOSEU NEG mg/dL 02/16/2010 0000   HGBUR MODERATE (A) 10/21/2019 1113   BILIRUBINUR NEGATIVE 10/21/2019 1113   BILIRUBINUR negative 07/24/2018 1502   KETONESUR NEGATIVE 10/21/2019 1113   PROTEINUR 100 (A) 10/21/2019 1113   UROBILINOGEN negative (A) 07/24/2018 1502   UROBILINOGEN 0.2 09/13/2014 1634   NITRITE POSITIVE (A) 10/21/2019 1113   LEUKOCYTESUR LARGE (A) 10/21/2019 1113   Sepsis Labs Invalid input(s): PROCALCITONIN,  WBC,  LACTICIDVEN Microbiology Recent Results (from the past 240 hour(s))  Urine culture     Status: Abnormal   Collection Time: 10/21/19 11:13 AM   Specimen: Urine, Clean Catch  Result Value Ref Range Status   Specimen Description URINE, CLEAN CATCH  Final   Special Requests   Final    Normal Performed at [facility], [place] [address], [place], [place] [address]    Culture >=100,000 COLONIES/mL KLEBSIELLA PNEUMONIAE (A)  Final   Report Status 10/23/2019 FINAL  Final   Organism ID, Bacteria KLEBSIELLA PNEUMONIAE (A)  Final      Susceptibility   Klebsiella pneumoniae - MIC*    AMPICILLIN RESISTANT Resistant     CEFAZOLIN <=4 SENSITIVE Sensitive     CEFTRIAXONE <=1 SENSITIVE Sensitive     CIPROFLOXACIN <=0.25 SENSITIVE Sensitive     GENTAMICIN <=1 SENSITIVE Sensitive     IMIPENEM <=0.25 SENSITIVE Sensitive  NITROFURANTOIN 64 INTERMEDIATE Intermediate     TRIMETH/SULFA <=20 SENSITIVE Sensitive     AMPICILLIN/SULBACTAM 4 SENSITIVE Sensitive     PIP/TAZO <=4 SENSITIVE Sensitive     * >=100,000 COLONIES/mL  KLEBSIELLA PNEUMONIAE  SARS CORONAVIRUS 2 (TAT 6-24 HRS) Nasopharyngeal Nasopharyngeal Swab     Status: None   Collection Time: 10/21/19 12:35 PM   Specimen: Nasopharyngeal Swab  Result Value Ref Range Status   SARS Coronavirus 2 NEGATIVE NEGATIVE Final    Comment: (NOTE) SARS-CoV-2 target nucleic acids are NOT DETECTED. The SARS-CoV-2 RNA is generally detectable in upper and lower respiratory specimens during the acute phase of infection. Negative results do not preclude SARS-CoV-2 infection, do not rule out co-infections with other pathogens, and should not be used as the sole basis for treatment or other patient management decisions. Negative results must be combined with clinical observations, patient history, and epidemiological information. The expected result is Negative. Fact Sheet for Patients: SugarRoll.be Fact Sheet for Healthcare Providers: https://www.woods-mathews.com/ This test is not yet approved or cleared by the [place] FDA and  has been authorized for detection and/or diagnosis of SARS-CoV-2 by FDA under an Emergency Use Authorization (EUA). This EUA will remain  in effect (meaning this test can be used) for the duration of the COVID-19 declaration under Section 56 4(b)(1) of the Act, 21 U.S.C. section 360bbb-3(b)(1), unless the authorization is terminated or revoked sooner. Performed at [facility], [place] [address], [place], [place] [address]   Culture, blood (routine x 2)     Status: None (Preliminary result)   Collection Time: 10/21/19 12:43 PM   Specimen: BLOOD  Result Value Ref Range Status   Specimen Description BLOOD BLOOD RIGHT FOREARM  Final   Special Requests   Final    BOTTLES DRAWN AEROBIC AND ANAEROBIC Blood Culture results may not be optimal due to an inadequate volume of blood received in culture bottles   Culture   Final    NO GROWTH 3 DAYS Performed at [facility], [place] [address] [place], [place] [address]    Report Status PENDING  Incomplete  Culture, blood (routine x 2)     Status: None (Preliminary result)   Collection Time: 10/21/19 12:50 PM   Specimen: BLOOD  Result Value Ref Range Status   Specimen Description BLOOD BLOOD LEFT WRIST  Final   Special Requests   Final    BOTTLES DRAWN AEROBIC AND ANAEROBIC Blood Culture results may not be optimal due to an inadequate volume of blood received in culture bottles   Culture   Final    NO GROWTH 3 DAYS Performed at [facility], [place] [address] [place], [place] [address]    Report Status PENDING  Incomplete     Time coordinating discharge: Over 30 minutes  SIGNED:   Little Ishikawa, DO [facility] 10/25/2019, 7:32 AM Pager   If 7PM-7AM, please contact night-coverage [email or web address]

## 2019-10-25 NOTE — TOC Transition Note (Signed)
Transition of Care [facility]) - CM/SW Discharge Note   Patient Details  Name: Justin [name] MRN: [id number] Date of Birth: 1928-12-14  [facility] CM/SW Contact:  Alexander Mt, LCSW Phone Number: 10/25/2019, 10:20 AM   Clinical Narrative:    CSW spoke with MD, pt medically stable to d/c. Pt plan for SNF at [facility]. Pt wife to transport pt to SNF. All paperwork sent through hub.    Final next level of care: Skilled Nursing Facility Barriers to Discharge: Barriers Resolved   Patient Goals and CMS Choice Patient states their goals for this hospitalization and ongoing recovery are:: prefer for rehab at [facility] CMS Medicare.gov Compare Post Acute Care list provided to:: Patient Represenative (must comment)(pt wife [place]) Choice offered to / list presented to : Spouse  Discharge Placement PASRR number recieved: 10/23/19            Patient chooses bed at: [facility] Patient to be transferred to facility by: pt wife personal car Name of family member notified: pt wife [place] Patient and family notified of of transfer: 10/25/19  Discharge Plan and Services In-house Referral: Clinical Social Work Discharge Planning Services: CM Consult Post Acute Care Choice: [place], Durable Medical Equipment             Readmission Risk Interventions Readmission Risk Prevention Plan 10/23/2019  Transportation Screening Complete  PCP or Specialist Appt within 5-7 Days Not Complete  Not Complete comments plan for SNF  Home Care Screening Complete  Medication Review (RN CM) Referral to Pharmacy  Some recent data might be hidden

## 2019-10-26 LAB — CULTURE, BLOOD (ROUTINE X 2)
Culture: NO GROWTH
Culture: NO GROWTH

## 2019-10-30 DIAGNOSIS — I480 Paroxysmal atrial fibrillation: Secondary | ICD-10-CM | POA: Diagnosis not present

## 2019-10-30 DIAGNOSIS — I4811 Longstanding persistent atrial fibrillation: Secondary | ICD-10-CM | POA: Diagnosis not present

## 2019-10-30 DIAGNOSIS — R0602 Shortness of breath: Secondary | ICD-10-CM | POA: Diagnosis not present

## 2019-10-30 DIAGNOSIS — R000 Tachycardia, unspecified: Secondary | ICD-10-CM | POA: Diagnosis not present

## 2019-10-30 DIAGNOSIS — N400 Enlarged prostate without lower urinary tract symptoms: Secondary | ICD-10-CM | POA: Diagnosis not present

## 2019-10-30 DIAGNOSIS — I350 Nonrheumatic aortic (valve) stenosis: Secondary | ICD-10-CM | POA: Diagnosis not present

## 2019-10-30 DIAGNOSIS — M2550 Pain in unspecified joint: Secondary | ICD-10-CM | POA: Diagnosis not present

## 2019-10-30 DIAGNOSIS — Z515 Encounter for palliative care: Secondary | ICD-10-CM | POA: Diagnosis not present

## 2019-10-30 DIAGNOSIS — N3090 Cystitis, unspecified without hematuria: Secondary | ICD-10-CM | POA: Diagnosis not present

## 2019-10-30 DIAGNOSIS — J9601 Acute respiratory failure with hypoxia: Secondary | ICD-10-CM | POA: Diagnosis not present

## 2019-10-30 DIAGNOSIS — I4891 Unspecified atrial fibrillation: Secondary | ICD-10-CM | POA: Diagnosis not present

## 2019-10-30 DIAGNOSIS — Z951 Presence of aortocoronary bypass graft: Secondary | ICD-10-CM | POA: Diagnosis not present

## 2019-10-30 DIAGNOSIS — R0682 Tachypnea, not elsewhere classified: Secondary | ICD-10-CM | POA: Diagnosis not present

## 2019-10-30 DIAGNOSIS — Z7401 Bed confinement status: Secondary | ICD-10-CM | POA: Diagnosis not present

## 2019-10-30 DIAGNOSIS — R627 Adult failure to thrive: Secondary | ICD-10-CM | POA: Diagnosis not present

## 2019-10-30 DIAGNOSIS — Z66 Do not resuscitate: Secondary | ICD-10-CM | POA: Diagnosis not present

## 2019-10-30 DIAGNOSIS — R0603 Acute respiratory distress: Secondary | ICD-10-CM | POA: Diagnosis not present

## 2019-10-30 DIAGNOSIS — Z7982 Long term (current) use of aspirin: Secondary | ICD-10-CM | POA: Diagnosis not present

## 2019-10-30 DIAGNOSIS — D72829 Elevated white blood cell count, unspecified: Secondary | ICD-10-CM | POA: Diagnosis not present

## 2019-10-30 DIAGNOSIS — R531 Weakness: Secondary | ICD-10-CM | POA: Diagnosis not present

## 2019-10-30 DIAGNOSIS — H9190 Unspecified hearing loss, unspecified ear: Secondary | ICD-10-CM | POA: Diagnosis not present

## 2019-10-30 DIAGNOSIS — I447 Left bundle-branch block, unspecified: Secondary | ICD-10-CM | POA: Diagnosis not present

## 2019-10-30 DIAGNOSIS — J9690 Respiratory failure, unspecified, unspecified whether with hypoxia or hypercapnia: Secondary | ICD-10-CM | POA: Diagnosis not present

## 2019-10-30 DIAGNOSIS — I252 Old myocardial infarction: Secondary | ICD-10-CM | POA: Diagnosis not present

## 2019-10-30 DIAGNOSIS — I5023 Acute on chronic systolic (congestive) heart failure: Secondary | ICD-10-CM | POA: Diagnosis not present

## 2019-10-30 DIAGNOSIS — G20 Parkinson's disease: Secondary | ICD-10-CM | POA: Diagnosis not present

## 2019-10-30 DIAGNOSIS — E039 Hypothyroidism, unspecified: Secondary | ICD-10-CM | POA: Diagnosis not present

## 2019-10-30 DIAGNOSIS — I482 Chronic atrial fibrillation, unspecified: Secondary | ICD-10-CM | POA: Diagnosis not present

## 2019-10-30 DIAGNOSIS — R079 Chest pain, unspecified: Secondary | ICD-10-CM | POA: Diagnosis not present

## 2019-10-30 DIAGNOSIS — I110 Hypertensive heart disease with heart failure: Secondary | ICD-10-CM | POA: Diagnosis not present

## 2019-10-30 DIAGNOSIS — I509 Heart failure, unspecified: Secondary | ICD-10-CM | POA: Diagnosis not present

## 2019-10-30 DIAGNOSIS — Z79899 Other long term (current) drug therapy: Secondary | ICD-10-CM | POA: Diagnosis not present

## 2019-10-30 DIAGNOSIS — Z85828 Personal history of other malignant neoplasm of skin: Secondary | ICD-10-CM | POA: Diagnosis not present

## 2019-10-30 DIAGNOSIS — R0789 Other chest pain: Secondary | ICD-10-CM | POA: Diagnosis not present

## 2019-10-30 DIAGNOSIS — J80 Acute respiratory distress syndrome: Secondary | ICD-10-CM | POA: Diagnosis not present

## 2019-10-30 DIAGNOSIS — I454 Nonspecific intraventricular block: Secondary | ICD-10-CM | POA: Diagnosis not present

## 2019-10-30 MED ORDER — FINASTERIDE 5 MG PO TABS
5.00 | ORAL_TABLET | ORAL | Status: DC
Start: 2019-11-02 — End: 2019-10-30

## 2019-10-30 MED ORDER — ATORVASTATIN CALCIUM 10 MG PO TABS
10.00 | ORAL_TABLET | ORAL | Status: DC
Start: 2019-11-01 — End: 2019-10-30

## 2019-10-30 MED ORDER — ACETAMINOPHEN 325 MG PO TABS
650.00 | ORAL_TABLET | ORAL | Status: DC
Start: ? — End: 2019-10-30

## 2019-10-30 MED ORDER — CARBIDOPA-LEVODOPA 25-100 MG PO TABS
1.00 | ORAL_TABLET | ORAL | Status: DC
Start: 2019-11-01 — End: 2019-10-30

## 2019-10-30 MED ORDER — ONDANSETRON 4 MG PO TBDP
4.00 | ORAL_TABLET | ORAL | Status: DC
Start: ? — End: 2019-10-30

## 2019-10-30 MED ORDER — OXYCODONE HCL 5 MG/5ML PO SOLN
5.00 | ORAL | Status: DC
Start: ? — End: 2019-10-30

## 2019-10-30 MED ORDER — FUROSEMIDE 10 MG/ML IJ SOLN
40.00 | INTRAMUSCULAR | Status: DC
Start: 2019-10-30 — End: 2019-10-30

## 2019-10-30 MED ORDER — LORAZEPAM 2 MG/ML IJ SOLN
0.50 | INTRAMUSCULAR | Status: DC
Start: ? — End: 2019-10-30

## 2019-10-30 MED ORDER — ACETAMINOPHEN 650 MG RE SUPP
650.00 | RECTAL | Status: DC
Start: ? — End: 2019-10-30

## 2019-10-30 MED ORDER — DSS 100 MG PO CAPS
100.00 | ORAL_CAPSULE | ORAL | Status: DC
Start: 2019-11-02 — End: 2019-10-30

## 2019-10-30 MED ORDER — CARBOXYMETHYLCELLULOSE SODIUM 0.5 % OP SOLN
1.00 | OPHTHALMIC | Status: DC
Start: 2019-11-02 — End: 2019-10-30

## 2019-10-30 MED ORDER — CLOPIDOGREL BISULFATE 75 MG PO TABS
75.00 | ORAL_TABLET | ORAL | Status: DC
Start: 2019-11-02 — End: 2019-10-30

## 2019-10-30 MED ORDER — ASPIRIN 81 MG PO TBEC
81.00 | DELAYED_RELEASE_TABLET | ORAL | Status: DC
Start: 2019-11-02 — End: 2019-10-30

## 2019-10-30 MED ORDER — AMIODARONE HCL 200 MG PO TABS
200.00 | ORAL_TABLET | ORAL | Status: DC
Start: 2019-11-02 — End: 2019-10-30

## 2019-10-30 MED ORDER — FERROUS SULFATE 325 (65 FE) MG PO TABS
325.00 | ORAL_TABLET | ORAL | Status: DC
Start: 2019-11-02 — End: 2019-10-30

## 2019-10-30 MED ORDER — LEVOTHYROXINE SODIUM 75 MCG PO TABS
75.00 | ORAL_TABLET | ORAL | Status: DC
Start: 2019-11-02 — End: 2019-10-30

## 2019-11-01 MED ORDER — POLYVINYL ALCOHOL 1.4 % OP SOLN
1.00 | OPHTHALMIC | Status: DC
Start: ? — End: 2019-11-01

## 2019-11-01 MED ORDER — FUROSEMIDE 10 MG/ML IJ SOLN
40.00 | INTRAMUSCULAR | Status: DC
Start: 2019-11-02 — End: 2019-11-01

## 2019-11-01 MED ORDER — POLYETHYLENE GLYCOL 3350 17 GM/SCOOP PO POWD
17.00 | ORAL | Status: DC
Start: ? — End: 2019-11-01

## 2019-11-01 MED ORDER — BISACODYL 10 MG RE SUPP
10.00 | RECTAL | Status: DC
Start: ? — End: 2019-11-01

## 2019-11-08 DIAGNOSIS — G9340 Encephalopathy, unspecified: Secondary | ICD-10-CM

## 2019-11-27 DEATH — deceased

## 2019-12-04 IMAGING — CT CT ANGIO NECK
1 of 11 series · 6 of 46 positions shown, 11 images · IV contrast (iopamidol)
Comparison: Head CT 08/13/2018

Brain MRI 08/13/2018

CLINICAL DATA: Ataxia with suspected stroke

EXAM:
CT ANGIOGRAPHY HEAD AND NECK
TECHNIQUE: Multidetector CT imaging of the head and neck was performed using
the standard protocol during bolus administration of intravenous
contrast. Multiplanar CT image reconstructions and MIPs were
obtained to evaluate the vascular anatomy. Carotid stenosis
measurements (when applicable) are obtained utilizing NASCET
criteria, using the distal internal carotid diameter as the
denominator.
CONTRAST:  100mL 9DGU00-DBU IOPAMIDOL (9DGU00-DBU) INJECTION 76%

[Series 7: carotid/brain 2.0 i30f 3 · axial · 0.55mm/px · z∈[+1232,+1482]mm · 6 of 176 slices shown, 11 images]
[im 26/176  soft-tissue]
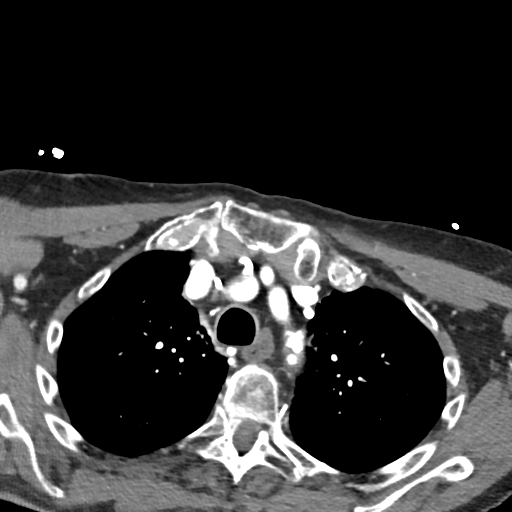
[im 26/176  bone]
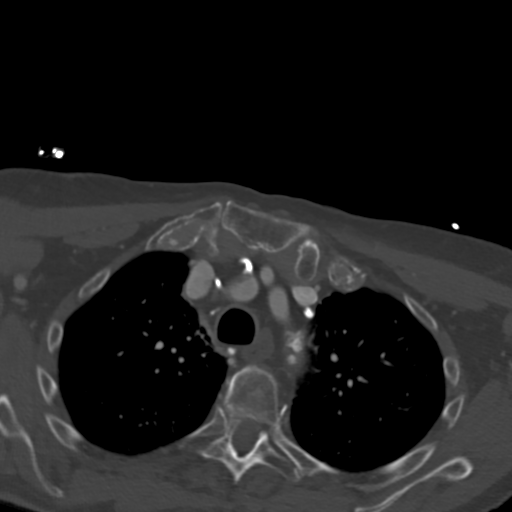
[im 51/176  soft-tissue]
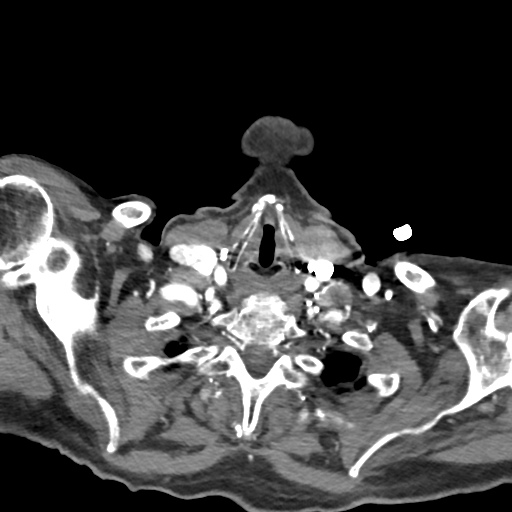
[im 76/176  soft-tissue]
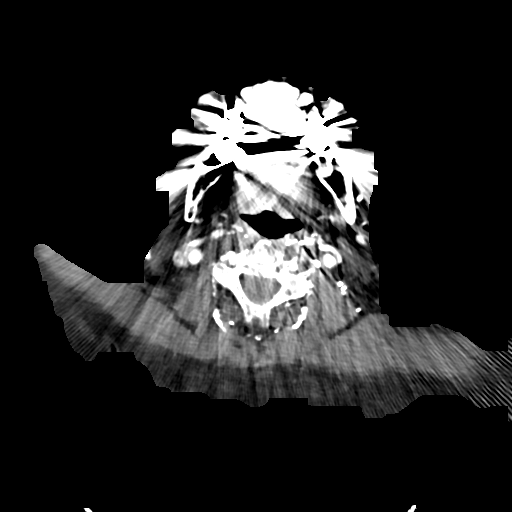
[im 76/176  lung]
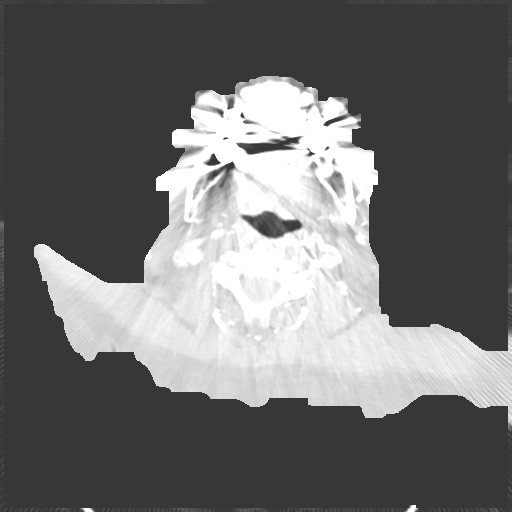
[im 101/176  soft-tissue]
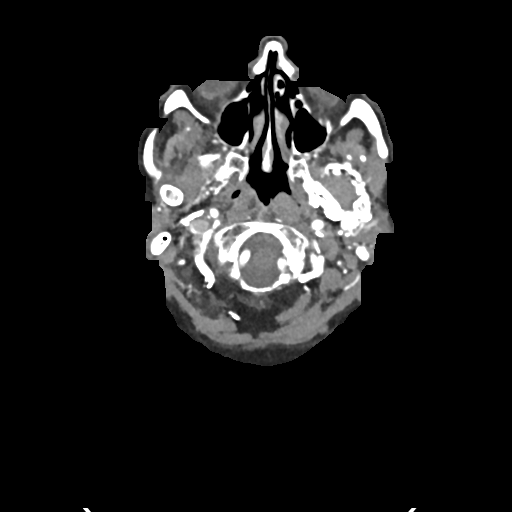
[im 101/176  lung]
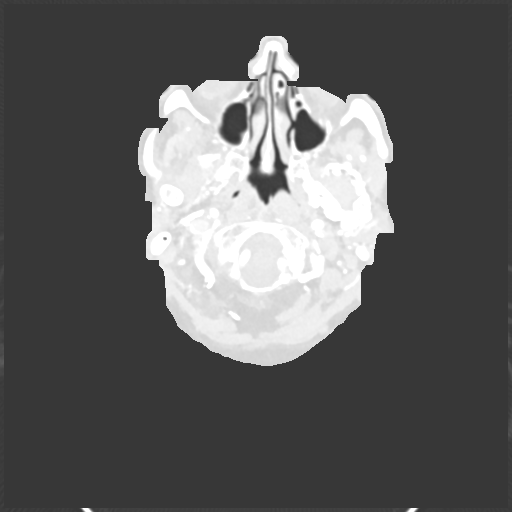
[im 126/176  soft-tissue]
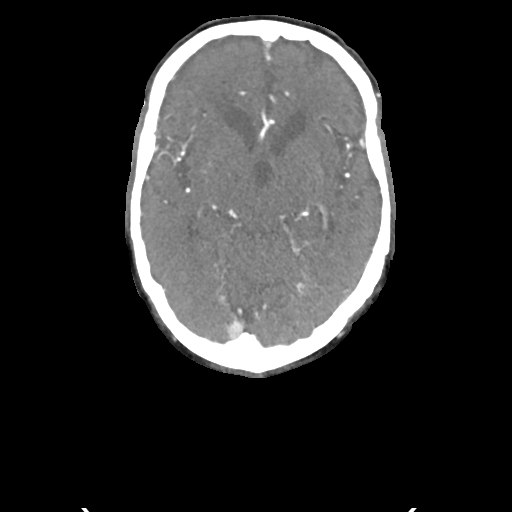
[im 126/176  lung]
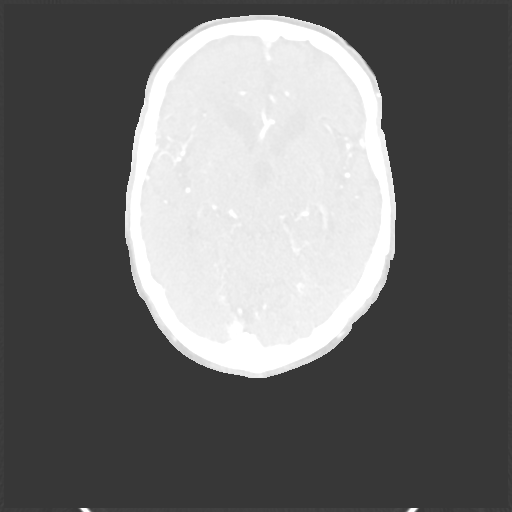
[im 151/176  soft-tissue]
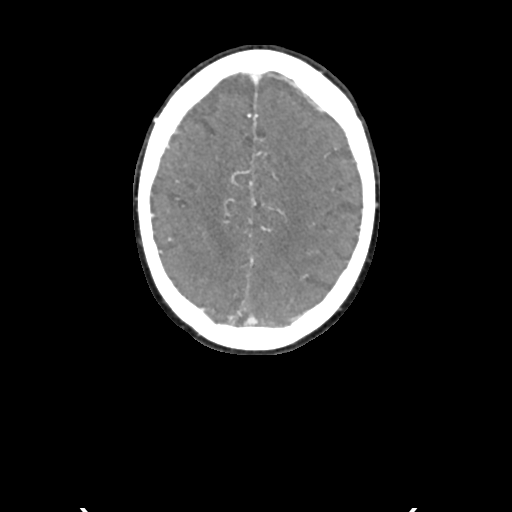
[im 151/176  lung]
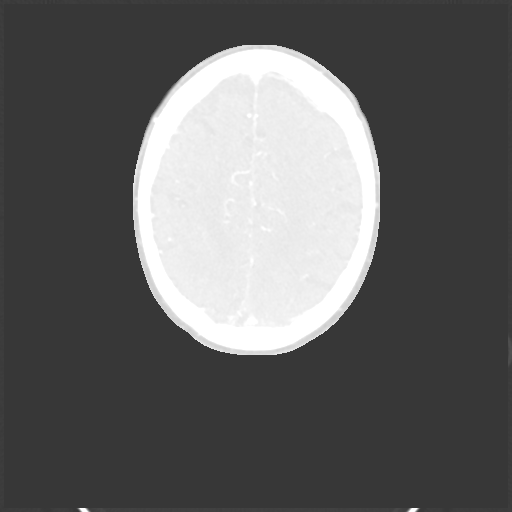

[6 of 46 positions shown; findings below may reference images not displayed]

FINDINGS: CT HEAD FINDINGS

Brain: There is no mass, hemorrhage or extra-axial collection. The
size and configuration of the ventricles and extra-axial CSF spaces
are normal. There is no acute or chronic infarction. The brain
parenchyma is normal.

Skull: The visualized skull base, calvarium and extracranial soft
tissues are normal.

Sinuses/Orbits: No fluid levels or advanced mucosal thickening of
the visualized paranasal sinuses. No mastoid or middle ear effusion.
The orbits are normal.

CTA NECK FINDINGS

SKELETON: There is no bony spinal canal stenosis. No lytic or
blastic lesion.

OTHER NECK: Normal pharynx, larynx and major salivary glands. No
cervical lymphadenopathy. Unremarkable thyroid gland.

UPPER CHEST: Numerous calcified mediastinal lymph nodes.

AORTIC ARCH: There is mild calcific atherosclerosis of the aortic
arch. There is no aneurysm, dissection or hemodynamically
significant stenosis of the visualized ascending aorta and aortic
arch. Conventional 3 vessel aortic branching pattern. The visualized
proximal subclavian arteries are widely patent.

RIGHT CAROTID SYSTEM:

--Common carotid artery: Widely patent origin without common carotid
artery dissection or aneurysm.

--Internal carotid artery: Normal without aneurysm, dissection or
stenosis.

--External carotid artery: No acute abnormality.

LEFT CAROTID SYSTEM:

--Common carotid artery: Widely patent origin without common carotid
artery dissection or aneurysm.

--Internal carotid artery: Normal without aneurysm, dissection or
stenosis.

--External carotid artery: No acute abnormality.

VERTEBRAL ARTERIES: Right dominant configuration. Both origins are
normal. No dissection, occlusion or flow-limiting stenosis to the
vertebrobasilar confluence.

CTA HEAD FINDINGS

POSTERIOR CIRCULATION:

--Basilar artery: Normal.

--Posterior cerebral arteries: Normal. Both originate from the
basilar artery.

--Superior cerebellar arteries: Normal.

--Inferior cerebellar arteries: Normal anterior and posterior
inferior cerebellar arteries.

ANTERIOR CIRCULATION:

--Intracranial internal carotid arteries: Atherosclerotic
calcification of the internal carotid arteries at the skull base
without hemodynamically significant stenosis.

--Anterior cerebral arteries: Normal. Both A1 segments are present.
Patent anterior communicating artery.

--Middle cerebral arteries: Normal.

--Posterior communicating arteries: Absent bilaterally.

VENOUS SINUSES: As permitted by contrast timing, patent.

ANATOMIC VARIANTS: None

DELAYED PHASE: No parenchymal contrast enhancement.

Review of the MIP images confirms the above findings.
IMPRESSION: 1. No emergent large vessel occlusion or hemodynamically significant
stenosis of the arteries of the head and neck.
2. Numerous calcified mediastinal lymph nodes, possibly a sequela of
prior granulomatous infection.
3.  Aortic atherosclerosis (3026N-[id number].F).

## 2019-12-19 ENCOUNTER — Ambulatory Visit: Payer: PPO | Admitting: Cardiology
